# Patient Record
Sex: Female | Born: 1970 | Race: White | Hispanic: No | Marital: Single | Smoking: Never smoker
Health system: Southern US, Community
[De-identification: ages and names within clinical notes are randomized; demographics above are authoritative.]

## PROBLEM LIST (undated history)

## (undated) DIAGNOSIS — G43909 Migraine, unspecified, not intractable, without status migrainosus: Secondary | ICD-10-CM

## (undated) DIAGNOSIS — B360 Pityriasis versicolor: Secondary | ICD-10-CM

## (undated) DIAGNOSIS — K5909 Other constipation: Secondary | ICD-10-CM

## (undated) DIAGNOSIS — S069X9A Unspecified intracranial injury with loss of consciousness of unspecified duration, initial encounter: Secondary | ICD-10-CM

## (undated) DIAGNOSIS — Z9289 Personal history of other medical treatment: Secondary | ICD-10-CM

## (undated) DIAGNOSIS — I7771 Dissection of carotid artery: Secondary | ICD-10-CM

## (undated) DIAGNOSIS — Q330 Congenital cystic lung: Secondary | ICD-10-CM

## (undated) DIAGNOSIS — E559 Vitamin D deficiency, unspecified: Secondary | ICD-10-CM

## (undated) DIAGNOSIS — I10 Essential (primary) hypertension: Secondary | ICD-10-CM

## (undated) DIAGNOSIS — F329 Major depressive disorder, single episode, unspecified: Secondary | ICD-10-CM

## (undated) DIAGNOSIS — D509 Iron deficiency anemia, unspecified: Secondary | ICD-10-CM

## (undated) DIAGNOSIS — F32A Depression, unspecified: Secondary | ICD-10-CM

## (undated) DIAGNOSIS — M1A00X0 Idiopathic chronic gout, unspecified site, without tophus (tophi): Secondary | ICD-10-CM

## (undated) DIAGNOSIS — J189 Pneumonia, unspecified organism: Secondary | ICD-10-CM

## (undated) DIAGNOSIS — Z87898 Personal history of other specified conditions: Secondary | ICD-10-CM

## (undated) DIAGNOSIS — K2270 Barrett's esophagus without dysplasia: Secondary | ICD-10-CM

## (undated) DIAGNOSIS — F419 Anxiety disorder, unspecified: Secondary | ICD-10-CM

## (undated) DIAGNOSIS — R112 Nausea with vomiting, unspecified: Secondary | ICD-10-CM

## (undated) DIAGNOSIS — K449 Diaphragmatic hernia without obstruction or gangrene: Secondary | ICD-10-CM

## (undated) DIAGNOSIS — Z9889 Other specified postprocedural states: Secondary | ICD-10-CM

## (undated) DIAGNOSIS — Z86711 Personal history of pulmonary embolism: Secondary | ICD-10-CM

## (undated) DIAGNOSIS — Z86718 Personal history of other venous thrombosis and embolism: Secondary | ICD-10-CM

## (undated) DIAGNOSIS — E538 Deficiency of other specified B group vitamins: Secondary | ICD-10-CM

## (undated) DIAGNOSIS — K219 Gastro-esophageal reflux disease without esophagitis: Secondary | ICD-10-CM

## (undated) HISTORY — PX: BRONCHOSCOPY: SUR163

## (undated) HISTORY — DX: Personal history of other venous thrombosis and embolism: Z86.718

## (undated) HISTORY — DX: Major depressive disorder, single episode, unspecified: F32.9

## (undated) HISTORY — DX: Depression, unspecified: F32.A

## (undated) HISTORY — PX: OTHER SURGICAL HISTORY: SHX169

## (undated) HISTORY — DX: Personal history of pulmonary embolism: Z86.711

## (undated) HISTORY — DX: Congenital cystic lung: Q33.0

## (undated) HISTORY — PX: EXPLORATORY LAPAROTOMY: SUR591

## (undated) HISTORY — DX: Gastro-esophageal reflux disease without esophagitis: K21.9

## (undated) HISTORY — DX: Anxiety disorder, unspecified: F41.9

## (undated) HISTORY — DX: Pityriasis versicolor: B36.0

## (undated) HISTORY — PX: ANEURYSM COILING: SHX5349

---
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
## 1989-06-20 HISTORY — PX: LUNG LOBECTOMY: SHX167

## 1998-10-27 ENCOUNTER — Emergency Department (HOSPITAL_COMMUNITY): Admission: EM | Admit: 1998-10-27 | Discharge: 1998-10-28 | Payer: Self-pay | Admitting: Internal Medicine

## 1999-03-13 ENCOUNTER — Emergency Department (HOSPITAL_COMMUNITY): Admission: EM | Admit: 1999-03-13 | Discharge: 1999-03-14 | Payer: Self-pay | Admitting: *Deleted

## 1999-04-16 ENCOUNTER — Encounter: Payer: Self-pay | Admitting: Internal Medicine

## 1999-04-16 ENCOUNTER — Ambulatory Visit (HOSPITAL_COMMUNITY): Admission: RE | Admit: 1999-04-16 | Discharge: 1999-04-16 | Payer: Self-pay | Admitting: Internal Medicine

## 1999-05-26 ENCOUNTER — Encounter: Payer: Self-pay | Admitting: Internal Medicine

## 1999-05-26 ENCOUNTER — Encounter: Admission: RE | Admit: 1999-05-26 | Discharge: 1999-05-26 | Payer: Self-pay | Admitting: Internal Medicine

## 1999-08-28 ENCOUNTER — Encounter: Payer: Self-pay | Admitting: Emergency Medicine

## 1999-08-28 ENCOUNTER — Emergency Department (HOSPITAL_COMMUNITY): Admission: EM | Admit: 1999-08-28 | Discharge: 1999-08-28 | Payer: Self-pay | Admitting: Emergency Medicine

## 2000-03-11 ENCOUNTER — Emergency Department (HOSPITAL_COMMUNITY): Admission: EM | Admit: 2000-03-11 | Discharge: 2000-03-11 | Payer: Self-pay | Admitting: Emergency Medicine

## 2000-09-28 ENCOUNTER — Emergency Department (HOSPITAL_COMMUNITY): Admission: EM | Admit: 2000-09-28 | Discharge: 2000-09-29 | Payer: Self-pay | Admitting: Emergency Medicine

## 2000-12-02 ENCOUNTER — Emergency Department (HOSPITAL_COMMUNITY): Admission: EM | Admit: 2000-12-02 | Discharge: 2000-12-02 | Payer: Self-pay | Admitting: Emergency Medicine

## 2002-01-28 ENCOUNTER — Emergency Department (HOSPITAL_COMMUNITY): Admission: EM | Admit: 2002-01-28 | Discharge: 2002-01-28 | Payer: Self-pay | Admitting: Emergency Medicine

## 2002-01-28 ENCOUNTER — Encounter: Payer: Self-pay | Admitting: Emergency Medicine

## 2002-12-01 ENCOUNTER — Emergency Department (HOSPITAL_COMMUNITY): Admission: EM | Admit: 2002-12-01 | Discharge: 2002-12-02 | Payer: Self-pay | Admitting: Emergency Medicine

## 2003-07-13 ENCOUNTER — Emergency Department (HOSPITAL_COMMUNITY): Admission: EM | Admit: 2003-07-13 | Discharge: 2003-07-14 | Payer: Self-pay

## 2003-08-14 ENCOUNTER — Observation Stay (HOSPITAL_COMMUNITY): Admission: EM | Admit: 2003-08-14 | Discharge: 2003-08-15 | Payer: Self-pay | Admitting: Emergency Medicine

## 2003-08-16 ENCOUNTER — Emergency Department (HOSPITAL_COMMUNITY): Admission: EM | Admit: 2003-08-16 | Discharge: 2003-08-16 | Payer: Self-pay | Admitting: Emergency Medicine

## 2003-08-17 ENCOUNTER — Emergency Department (HOSPITAL_COMMUNITY): Admission: EM | Admit: 2003-08-17 | Discharge: 2003-08-17 | Payer: Self-pay | Admitting: Emergency Medicine

## 2003-08-18 ENCOUNTER — Emergency Department: Admission: RE | Admit: 2003-08-18 | Discharge: 2003-08-18 | Payer: Self-pay | Admitting: Internal Medicine

## 2003-09-19 HISTORY — PX: KNEE ARTHROSCOPY W/ ACL RECONSTRUCTION: SHX1858

## 2003-10-14 ENCOUNTER — Observation Stay (HOSPITAL_COMMUNITY): Admission: EM | Admit: 2003-10-14 | Discharge: 2003-10-15 | Payer: Self-pay | Admitting: Orthopedic Surgery

## 2003-12-19 ENCOUNTER — Emergency Department (HOSPITAL_COMMUNITY): Admission: EM | Admit: 2003-12-19 | Discharge: 2003-12-19 | Payer: Self-pay | Admitting: Emergency Medicine

## 2004-01-25 ENCOUNTER — Emergency Department (HOSPITAL_COMMUNITY): Admission: EM | Admit: 2004-01-25 | Discharge: 2004-01-26 | Payer: Self-pay | Admitting: Emergency Medicine

## 2004-04-23 ENCOUNTER — Ambulatory Visit: Payer: Self-pay | Admitting: Hematology & Oncology

## 2004-05-06 ENCOUNTER — Emergency Department (HOSPITAL_COMMUNITY): Admission: EM | Admit: 2004-05-06 | Discharge: 2004-05-06 | Payer: Self-pay | Admitting: Emergency Medicine

## 2004-05-10 ENCOUNTER — Ambulatory Visit: Payer: Self-pay | Admitting: Internal Medicine

## 2004-05-17 ENCOUNTER — Ambulatory Visit: Payer: Self-pay | Admitting: Internal Medicine

## 2004-05-18 ENCOUNTER — Emergency Department (HOSPITAL_COMMUNITY): Admission: EM | Admit: 2004-05-18 | Discharge: 2004-05-18 | Payer: Self-pay | Admitting: Emergency Medicine

## 2004-05-18 ENCOUNTER — Ambulatory Visit: Payer: Self-pay | Admitting: Internal Medicine

## 2004-05-20 ENCOUNTER — Encounter (INDEPENDENT_AMBULATORY_CARE_PROVIDER_SITE_OTHER): Payer: Self-pay | Admitting: Specialist

## 2004-05-20 ENCOUNTER — Ambulatory Visit: Admission: RE | Admit: 2004-05-20 | Discharge: 2004-05-20 | Payer: Self-pay | Admitting: Pulmonary Disease

## 2004-05-20 ENCOUNTER — Ambulatory Visit: Payer: Self-pay | Admitting: Pulmonary Disease

## 2004-05-23 ENCOUNTER — Emergency Department (HOSPITAL_COMMUNITY): Admission: EM | Admit: 2004-05-23 | Discharge: 2004-05-24 | Payer: Self-pay

## 2004-05-27 ENCOUNTER — Ambulatory Visit: Payer: Self-pay | Admitting: Pulmonary Disease

## 2004-06-04 ENCOUNTER — Ambulatory Visit (HOSPITAL_COMMUNITY): Admission: RE | Admit: 2004-06-04 | Discharge: 2004-06-04 | Payer: Self-pay | Admitting: Hematology & Oncology

## 2004-06-19 ENCOUNTER — Emergency Department (HOSPITAL_COMMUNITY): Admission: EM | Admit: 2004-06-19 | Discharge: 2004-06-19 | Payer: Self-pay | Admitting: Emergency Medicine

## 2004-06-22 ENCOUNTER — Encounter: Admission: RE | Admit: 2004-06-22 | Discharge: 2004-06-22 | Payer: Self-pay | Admitting: Thoracic Surgery

## 2004-06-24 ENCOUNTER — Encounter (INDEPENDENT_AMBULATORY_CARE_PROVIDER_SITE_OTHER): Payer: Self-pay | Admitting: *Deleted

## 2004-06-24 ENCOUNTER — Inpatient Hospital Stay (HOSPITAL_COMMUNITY): Admission: RE | Admit: 2004-06-24 | Discharge: 2004-06-29 | Payer: Self-pay | Admitting: Thoracic Surgery

## 2004-07-06 ENCOUNTER — Encounter: Admission: RE | Admit: 2004-07-06 | Discharge: 2004-07-06 | Payer: Self-pay | Admitting: Thoracic Surgery

## 2004-07-26 ENCOUNTER — Ambulatory Visit: Payer: Self-pay | Admitting: Internal Medicine

## 2004-07-26 ENCOUNTER — Ambulatory Visit: Payer: Self-pay | Admitting: Hematology & Oncology

## 2004-07-28 ENCOUNTER — Encounter: Admission: RE | Admit: 2004-07-28 | Discharge: 2004-07-28 | Payer: Self-pay | Admitting: Thoracic Surgery

## 2004-08-04 ENCOUNTER — Ambulatory Visit: Payer: Self-pay | Admitting: Pulmonary Disease

## 2004-08-04 ENCOUNTER — Ambulatory Visit: Payer: Self-pay | Admitting: Internal Medicine

## 2004-08-10 ENCOUNTER — Ambulatory Visit: Payer: Self-pay | Admitting: Internal Medicine

## 2004-08-10 ENCOUNTER — Emergency Department (HOSPITAL_COMMUNITY): Admission: EM | Admit: 2004-08-10 | Discharge: 2004-08-10 | Payer: Self-pay | Admitting: Emergency Medicine

## 2004-08-14 ENCOUNTER — Emergency Department (HOSPITAL_COMMUNITY): Admission: EM | Admit: 2004-08-14 | Discharge: 2004-08-14 | Payer: Self-pay | Admitting: Emergency Medicine

## 2004-08-17 ENCOUNTER — Ambulatory Visit: Payer: Self-pay | Admitting: Internal Medicine

## 2004-08-18 ENCOUNTER — Ambulatory Visit: Payer: Self-pay | Admitting: Internal Medicine

## 2004-09-07 ENCOUNTER — Ambulatory Visit: Payer: Self-pay | Admitting: Internal Medicine

## 2004-09-10 ENCOUNTER — Ambulatory Visit: Payer: Self-pay | Admitting: Hematology & Oncology

## 2004-10-08 ENCOUNTER — Ambulatory Visit (HOSPITAL_COMMUNITY): Admission: RE | Admit: 2004-10-08 | Discharge: 2004-10-08 | Payer: Self-pay | Admitting: Hematology & Oncology

## 2004-11-07 ENCOUNTER — Emergency Department (HOSPITAL_COMMUNITY): Admission: EM | Admit: 2004-11-07 | Discharge: 2004-11-07 | Payer: Self-pay | Admitting: Emergency Medicine

## 2004-11-12 ENCOUNTER — Ambulatory Visit: Payer: Self-pay | Admitting: Internal Medicine

## 2004-12-15 ENCOUNTER — Emergency Department (HOSPITAL_COMMUNITY): Admission: EM | Admit: 2004-12-15 | Discharge: 2004-12-15 | Payer: Self-pay | Admitting: Emergency Medicine

## 2004-12-17 ENCOUNTER — Emergency Department (HOSPITAL_COMMUNITY): Admission: RE | Admit: 2004-12-17 | Discharge: 2004-12-18 | Payer: Self-pay | Admitting: Pulmonary Disease

## 2004-12-17 ENCOUNTER — Ambulatory Visit: Payer: Self-pay | Admitting: Pulmonary Disease

## 2004-12-20 ENCOUNTER — Ambulatory Visit: Payer: Self-pay | Admitting: Internal Medicine

## 2004-12-23 ENCOUNTER — Ambulatory Visit: Payer: Self-pay | Admitting: Pulmonary Disease

## 2005-01-06 ENCOUNTER — Ambulatory Visit: Payer: Self-pay | Admitting: Internal Medicine

## 2005-06-08 ENCOUNTER — Ambulatory Visit: Payer: Self-pay | Admitting: Internal Medicine

## 2005-06-15 ENCOUNTER — Emergency Department (HOSPITAL_COMMUNITY): Admission: EM | Admit: 2005-06-15 | Discharge: 2005-06-15 | Payer: Self-pay | Admitting: Emergency Medicine

## 2005-06-20 DIAGNOSIS — Z9289 Personal history of other medical treatment: Secondary | ICD-10-CM

## 2005-06-20 HISTORY — DX: Personal history of other medical treatment: Z92.89

## 2005-07-08 ENCOUNTER — Ambulatory Visit: Payer: Self-pay | Admitting: Internal Medicine

## 2005-10-14 ENCOUNTER — Ambulatory Visit: Payer: Self-pay | Admitting: Internal Medicine

## 2005-10-16 ENCOUNTER — Emergency Department (HOSPITAL_COMMUNITY): Admission: EM | Admit: 2005-10-16 | Discharge: 2005-10-17 | Payer: Self-pay | Admitting: Emergency Medicine

## 2006-02-01 ENCOUNTER — Ambulatory Visit: Payer: Self-pay | Admitting: Internal Medicine

## 2006-03-23 ENCOUNTER — Ambulatory Visit (HOSPITAL_COMMUNITY): Admission: RE | Admit: 2006-03-23 | Discharge: 2006-03-23 | Payer: Self-pay | Admitting: Gastroenterology

## 2006-04-03 ENCOUNTER — Emergency Department (HOSPITAL_COMMUNITY): Admission: EM | Admit: 2006-04-03 | Discharge: 2006-04-03 | Payer: Self-pay | Admitting: Emergency Medicine

## 2006-04-07 ENCOUNTER — Encounter: Admission: RE | Admit: 2006-04-07 | Discharge: 2006-04-07 | Payer: Self-pay | Admitting: Gastroenterology

## 2006-04-20 ENCOUNTER — Encounter: Admission: RE | Admit: 2006-04-20 | Discharge: 2006-04-20 | Payer: Self-pay | Admitting: Gastroenterology

## 2006-05-31 ENCOUNTER — Inpatient Hospital Stay (HOSPITAL_COMMUNITY): Admission: EM | Admit: 2006-05-31 | Discharge: 2006-06-02 | Payer: Self-pay | Admitting: Emergency Medicine

## 2006-06-01 ENCOUNTER — Ambulatory Visit: Payer: Self-pay | Admitting: Internal Medicine

## 2006-06-05 ENCOUNTER — Ambulatory Visit: Payer: Self-pay | Admitting: Internal Medicine

## 2006-06-07 ENCOUNTER — Emergency Department (HOSPITAL_COMMUNITY): Admission: EM | Admit: 2006-06-07 | Discharge: 2006-06-07 | Payer: Self-pay | Admitting: Emergency Medicine

## 2006-06-14 ENCOUNTER — Ambulatory Visit: Payer: Self-pay | Admitting: Internal Medicine

## 2006-06-26 ENCOUNTER — Ambulatory Visit: Payer: Self-pay | Admitting: Hematology & Oncology

## 2006-06-29 LAB — CBC & DIFF AND RETIC
Basophils Absolute: 0 10*3/uL (ref 0.0–0.1)
Eosinophils Absolute: 0.1 10*3/uL (ref 0.0–0.5)
HCT: 35.6 % (ref 34.8–46.6)
LYMPH%: 15.7 % (ref 14.0–48.0)
MCV: 72.4 fL — ABNORMAL LOW (ref 81.0–101.0)
MONO#: 0.6 10*3/uL (ref 0.1–0.9)
MONO%: 4.6 % (ref 0.0–13.0)
NEUT#: 10.1 10*3/uL — ABNORMAL HIGH (ref 1.5–6.5)
NEUT%: 78.4 % — ABNORMAL HIGH (ref 39.6–76.8)
Platelets: 444 10*3/uL — ABNORMAL HIGH (ref 145–400)
WBC: 12.9 10*3/uL — ABNORMAL HIGH (ref 3.9–10.0)

## 2006-07-03 LAB — FERRITIN: Ferritin: 9 ng/mL — ABNORMAL LOW (ref 10–291)

## 2006-07-03 LAB — TRANSFERRIN RECEPTOR, SOLUABLE: Transferrin Receptor, Soluble: 40.6 nmol/L

## 2006-07-20 LAB — CBC WITH DIFFERENTIAL/PLATELET
Basophils Absolute: 0 10*3/uL (ref 0.0–0.1)
EOS%: 2.1 % (ref 0.0–7.0)
Eosinophils Absolute: 0.1 10*3/uL (ref 0.0–0.5)
HGB: 11.7 g/dL (ref 11.6–15.9)
MCH: 23.7 pg — ABNORMAL LOW (ref 26.0–34.0)
MONO#: 0.6 10*3/uL (ref 0.1–0.9)
NEUT#: 3.7 10*3/uL (ref 1.5–6.5)
RDW: 24.9 % — ABNORMAL HIGH (ref 11.3–14.5)
WBC: 6.4 10*3/uL (ref 3.9–10.0)
lymph#: 2 10*3/uL (ref 0.9–3.3)

## 2006-07-27 LAB — CBC WITH DIFFERENTIAL/PLATELET
Basophils Absolute: 0.1 10*3/uL (ref 0.0–0.1)
Eosinophils Absolute: 0.1 10*3/uL (ref 0.0–0.5)
HCT: 34.5 % — ABNORMAL LOW (ref 34.8–46.6)
HGB: 11.5 g/dL — ABNORMAL LOW (ref 11.6–15.9)
MCV: 72.7 fL — ABNORMAL LOW (ref 81.0–101.0)
MONO%: 7.5 % (ref 0.0–13.0)
NEUT#: 4.3 10*3/uL (ref 1.5–6.5)
RDW: 23.8 % — ABNORMAL HIGH (ref 11.3–14.5)

## 2006-08-03 ENCOUNTER — Ambulatory Visit: Payer: Self-pay | Admitting: Internal Medicine

## 2006-08-03 ENCOUNTER — Observation Stay (HOSPITAL_COMMUNITY): Admission: EM | Admit: 2006-08-03 | Discharge: 2006-08-04 | Payer: Self-pay | Admitting: Emergency Medicine

## 2006-08-08 ENCOUNTER — Ambulatory Visit: Payer: Self-pay | Admitting: Hematology & Oncology

## 2006-10-28 ENCOUNTER — Emergency Department (HOSPITAL_COMMUNITY): Admission: EM | Admit: 2006-10-28 | Discharge: 2006-10-28 | Payer: Self-pay | Admitting: Emergency Medicine

## 2006-10-30 ENCOUNTER — Ambulatory Visit: Payer: Self-pay | Admitting: Internal Medicine

## 2006-12-17 ENCOUNTER — Emergency Department (HOSPITAL_COMMUNITY): Admission: EM | Admit: 2006-12-17 | Discharge: 2006-12-17 | Payer: Self-pay | Admitting: Emergency Medicine

## 2006-12-20 ENCOUNTER — Ambulatory Visit: Payer: Self-pay | Admitting: Internal Medicine

## 2007-01-13 ENCOUNTER — Encounter: Payer: Self-pay | Admitting: Internal Medicine

## 2007-01-13 DIAGNOSIS — Z86718 Personal history of other venous thrombosis and embolism: Secondary | ICD-10-CM | POA: Insufficient documentation

## 2007-01-13 DIAGNOSIS — R042 Hemoptysis: Secondary | ICD-10-CM | POA: Insufficient documentation

## 2007-02-01 ENCOUNTER — Emergency Department (HOSPITAL_COMMUNITY): Admission: EM | Admit: 2007-02-01 | Discharge: 2007-02-02 | Payer: Self-pay | Admitting: Emergency Medicine

## 2007-09-07 ENCOUNTER — Telehealth: Payer: Self-pay | Admitting: Internal Medicine

## 2008-05-09 ENCOUNTER — Telehealth: Payer: Self-pay | Admitting: Internal Medicine

## 2008-05-09 ENCOUNTER — Encounter: Payer: Self-pay | Admitting: Internal Medicine

## 2008-05-26 ENCOUNTER — Ambulatory Visit: Payer: Self-pay | Admitting: Internal Medicine

## 2008-05-26 DIAGNOSIS — J309 Allergic rhinitis, unspecified: Secondary | ICD-10-CM | POA: Insufficient documentation

## 2008-05-26 DIAGNOSIS — B351 Tinea unguium: Secondary | ICD-10-CM | POA: Insufficient documentation

## 2008-05-26 DIAGNOSIS — K219 Gastro-esophageal reflux disease without esophagitis: Secondary | ICD-10-CM | POA: Insufficient documentation

## 2008-05-26 DIAGNOSIS — G43909 Migraine, unspecified, not intractable, without status migrainosus: Secondary | ICD-10-CM | POA: Insufficient documentation

## 2008-05-26 DIAGNOSIS — J45909 Unspecified asthma, uncomplicated: Secondary | ICD-10-CM | POA: Insufficient documentation

## 2008-05-28 ENCOUNTER — Encounter: Payer: Self-pay | Admitting: Internal Medicine

## 2008-09-09 ENCOUNTER — Ambulatory Visit: Payer: Self-pay | Admitting: Internal Medicine

## 2008-09-09 DIAGNOSIS — F329 Major depressive disorder, single episode, unspecified: Secondary | ICD-10-CM | POA: Insufficient documentation

## 2008-09-09 DIAGNOSIS — F32A Depression, unspecified: Secondary | ICD-10-CM | POA: Insufficient documentation

## 2008-09-16 ENCOUNTER — Telehealth: Payer: Self-pay | Admitting: Internal Medicine

## 2008-09-26 ENCOUNTER — Ambulatory Visit: Payer: Self-pay | Admitting: Internal Medicine

## 2008-09-26 DIAGNOSIS — J9801 Acute bronchospasm: Secondary | ICD-10-CM | POA: Insufficient documentation

## 2008-09-26 DIAGNOSIS — J019 Acute sinusitis, unspecified: Secondary | ICD-10-CM | POA: Insufficient documentation

## 2008-11-18 ENCOUNTER — Emergency Department (HOSPITAL_COMMUNITY): Admission: EM | Admit: 2008-11-18 | Discharge: 2008-11-18 | Payer: Self-pay | Admitting: Emergency Medicine

## 2008-11-18 ENCOUNTER — Ambulatory Visit: Payer: Self-pay | Admitting: Internal Medicine

## 2008-11-18 DIAGNOSIS — J069 Acute upper respiratory infection, unspecified: Secondary | ICD-10-CM | POA: Insufficient documentation

## 2008-12-11 ENCOUNTER — Emergency Department (HOSPITAL_COMMUNITY): Admission: EM | Admit: 2008-12-11 | Discharge: 2008-12-11 | Payer: Self-pay | Admitting: Emergency Medicine

## 2008-12-11 ENCOUNTER — Telehealth: Payer: Self-pay | Admitting: Internal Medicine

## 2008-12-12 ENCOUNTER — Ambulatory Visit: Payer: Self-pay | Admitting: Endocrinology

## 2008-12-12 DIAGNOSIS — D649 Anemia, unspecified: Secondary | ICD-10-CM | POA: Insufficient documentation

## 2008-12-12 DIAGNOSIS — R002 Palpitations: Secondary | ICD-10-CM | POA: Insufficient documentation

## 2008-12-23 ENCOUNTER — Telehealth: Payer: Self-pay | Admitting: Internal Medicine

## 2008-12-24 ENCOUNTER — Ambulatory Visit: Payer: Self-pay | Admitting: Internal Medicine

## 2008-12-24 DIAGNOSIS — R609 Edema, unspecified: Secondary | ICD-10-CM | POA: Insufficient documentation

## 2009-01-06 ENCOUNTER — Emergency Department (HOSPITAL_BASED_OUTPATIENT_CLINIC_OR_DEPARTMENT_OTHER): Admission: EM | Admit: 2009-01-06 | Discharge: 2009-01-06 | Payer: Self-pay | Admitting: Emergency Medicine

## 2009-01-06 ENCOUNTER — Ambulatory Visit: Payer: Self-pay | Admitting: Diagnostic Radiology

## 2009-02-06 ENCOUNTER — Ambulatory Visit: Payer: Self-pay | Admitting: Internal Medicine

## 2009-02-06 DIAGNOSIS — R1110 Vomiting, unspecified: Secondary | ICD-10-CM | POA: Insufficient documentation

## 2009-02-09 ENCOUNTER — Ambulatory Visit: Payer: Self-pay | Admitting: Internal Medicine

## 2009-02-09 DIAGNOSIS — R42 Dizziness and giddiness: Secondary | ICD-10-CM | POA: Insufficient documentation

## 2009-02-09 DIAGNOSIS — E559 Vitamin D deficiency, unspecified: Secondary | ICD-10-CM | POA: Insufficient documentation

## 2009-02-09 DIAGNOSIS — R5381 Other malaise: Secondary | ICD-10-CM

## 2009-02-09 DIAGNOSIS — E876 Hypokalemia: Secondary | ICD-10-CM | POA: Insufficient documentation

## 2009-02-09 DIAGNOSIS — R5383 Other fatigue: Secondary | ICD-10-CM | POA: Insufficient documentation

## 2009-02-09 LAB — CONVERTED CEMR LAB: Lipase: 12 units/L (ref 0–75)

## 2009-02-10 LAB — CONVERTED CEMR LAB
AST: 27 units/L (ref 0–37)
BUN: 12 mg/dL (ref 6–23)
Basophils Absolute: 0 10*3/uL (ref 0.0–0.1)
Eosinophils Absolute: 0.1 10*3/uL (ref 0.0–0.7)
GFR calc non Af Amer: 65.71 mL/min (ref >60)
Glucose, Bld: 65 mg/dL — ABNORMAL LOW (ref 70–99)
HCT: 32.3 % — ABNORMAL LOW (ref 36.0–46.0)
Hemoglobin, Urine: NEGATIVE
Leukocytes, UA: NEGATIVE
Lymphs Abs: 2.1 10*3/uL (ref 0.7–4.0)
MCHC: 31.3 g/dL (ref 30.0–36.0)
Monocytes Absolute: 0.9 10*3/uL (ref 0.1–1.0)
Monocytes Relative: 6.9 % (ref 3.0–12.0)
Nitrite: NEGATIVE
Platelets: 447 10*3/uL — ABNORMAL HIGH (ref 150.0–400.0)
Potassium: 2.9 meq/L — ABNORMAL LOW (ref 3.5–5.1)
RDW: 15.8 % — ABNORMAL HIGH (ref 11.5–14.6)
Sed Rate: 47 mm/hr — ABNORMAL HIGH (ref 0–22)
Specific Gravity, Urine: <=1.005 (ref 1.000–1.030)
TSH: 2.08 microintl units/mL (ref 0.35–5.50)
Total Bilirubin: 0.9 mg/dL (ref 0.3–1.2)
Urobilinogen, UA: 0.2 (ref 0.0–1.0)

## 2009-02-12 ENCOUNTER — Telehealth: Payer: Self-pay | Admitting: Internal Medicine

## 2009-02-12 LAB — CONVERTED CEMR LAB: Vit D, 25-Hydroxy: 17 ng/mL — ABNORMAL LOW (ref 30–89)

## 2009-03-12 ENCOUNTER — Telehealth: Payer: Self-pay | Admitting: Internal Medicine

## 2009-04-06 ENCOUNTER — Telehealth: Payer: Self-pay | Admitting: Internal Medicine

## 2009-04-09 ENCOUNTER — Ambulatory Visit: Payer: Self-pay | Admitting: Internal Medicine

## 2009-04-09 LAB — CONVERTED CEMR LAB
ALT: 14 units/L (ref 0–35)
BUN: 7 mg/dL (ref 6–23)
Basophils Absolute: 0 10*3/uL (ref 0.0–0.1)
Bilirubin Urine: NEGATIVE
Bilirubin, Direct: 0.1 mg/dL (ref 0.0–0.3)
Chloride: 101 meq/L (ref 96–112)
Cholesterol: 223 mg/dL — ABNORMAL HIGH (ref 0–200)
Creatinine, Ser: 0.8 mg/dL (ref 0.4–1.2)
Eosinophils Absolute: 0.1 10*3/uL (ref 0.0–0.7)
GFR calc non Af Amer: 84.93 mL/min (ref >60)
Hemoglobin, Urine: NEGATIVE
Ketones, ur: NEGATIVE mg/dL
Leukocytes, UA: NEGATIVE
Lymphocytes Relative: 24.2 % (ref 12.0–46.0)
MCHC: 32.5 g/dL (ref 30.0–36.0)
Monocytes Relative: 5 % (ref 3.0–12.0)
Nitrite: NEGATIVE
Platelets: 350 10*3/uL (ref 150.0–400.0)
Potassium: 3.6 meq/L (ref 3.5–5.1)
RDW: 24.6 % — ABNORMAL HIGH (ref 11.5–14.6)
TSH: 1.05 microintl units/mL (ref 0.35–5.50)
Total Bilirubin: 0.7 mg/dL (ref 0.3–1.2)
Total CHOL/HDL Ratio: 6
Triglycerides: 94 mg/dL (ref 0.0–149.0)
Urobilinogen, UA: 0.2 (ref 0.0–1.0)
VLDL: 18.8 mg/dL (ref 0.0–40.0)
pH: 5.5 (ref 5.0–8.0)

## 2009-04-14 ENCOUNTER — Telehealth: Payer: Self-pay | Admitting: Internal Medicine

## 2009-04-20 ENCOUNTER — Telehealth: Payer: Self-pay | Admitting: Internal Medicine

## 2009-05-27 ENCOUNTER — Telehealth: Payer: Self-pay | Admitting: Internal Medicine

## 2009-06-16 ENCOUNTER — Encounter: Payer: Self-pay | Admitting: Internal Medicine

## 2009-07-17 ENCOUNTER — Ambulatory Visit (HOSPITAL_COMMUNITY): Admission: RE | Admit: 2009-07-17 | Discharge: 2009-07-17 | Payer: Self-pay | Admitting: Obstetrics and Gynecology

## 2009-08-21 ENCOUNTER — Ambulatory Visit: Payer: Self-pay | Admitting: Internal Medicine

## 2009-08-21 DIAGNOSIS — F411 Generalized anxiety disorder: Secondary | ICD-10-CM | POA: Insufficient documentation

## 2009-08-21 DIAGNOSIS — F419 Anxiety disorder, unspecified: Secondary | ICD-10-CM | POA: Insufficient documentation

## 2009-08-24 ENCOUNTER — Emergency Department (HOSPITAL_COMMUNITY): Admission: EM | Admit: 2009-08-24 | Discharge: 2009-08-24 | Payer: Self-pay | Admitting: Emergency Medicine

## 2009-08-24 LAB — CONVERTED CEMR LAB
ALT: 15 units/L (ref 0–35)
BUN: 12 mg/dL (ref 6–23)
Bilirubin Urine: NEGATIVE
Bilirubin, Direct: 0.1 mg/dL (ref 0.0–0.3)
CO2: 32 meq/L (ref 19–32)
Chloride: 103 meq/L (ref 96–112)
Creatinine, Ser: 0.9 mg/dL (ref 0.4–1.2)
Eosinophils Absolute: 0.1 10*3/uL (ref 0.0–0.7)
Eosinophils Relative: 1.1 % (ref 0.0–5.0)
Glucose, Bld: 94 mg/dL (ref 70–99)
HCT: 41.4 % (ref 36.0–46.0)
Ketones, ur: NEGATIVE mg/dL
Leukocytes, UA: NEGATIVE
Lymphs Abs: 2.2 10*3/uL (ref 0.7–4.0)
MCHC: 33 g/dL (ref 30.0–36.0)
MCV: 89.9 fL (ref 78.0–100.0)
Monocytes Absolute: 0.7 10*3/uL (ref 0.1–1.0)
Neutrophils Relative %: 66.6 % (ref 43.0–77.0)
Nitrite: NEGATIVE
Platelets: 325 10*3/uL (ref 150.0–400.0)
Potassium: 3.8 meq/L (ref 3.5–5.1)
RDW: 13.9 % (ref 11.5–14.6)
Sed Rate: 24 mm/hr — ABNORMAL HIGH (ref 0–22)
Specific Gravity, Urine: 1.02 (ref 1.000–1.030)
TSH: 1.53 microintl units/mL (ref 0.35–5.50)
Total Bilirubin: 0.6 mg/dL (ref 0.3–1.2)
Urobilinogen, UA: 0.2 (ref 0.0–1.0)
WBC: 8.9 10*3/uL (ref 4.5–10.5)

## 2009-09-14 ENCOUNTER — Telehealth: Payer: Self-pay | Admitting: Internal Medicine

## 2009-10-01 ENCOUNTER — Ambulatory Visit: Payer: Self-pay | Admitting: Internal Medicine

## 2009-10-01 DIAGNOSIS — H60399 Other infective otitis externa, unspecified ear: Secondary | ICD-10-CM | POA: Insufficient documentation

## 2009-10-01 DIAGNOSIS — E538 Deficiency of other specified B group vitamins: Secondary | ICD-10-CM | POA: Insufficient documentation

## 2009-10-01 DIAGNOSIS — H6120 Impacted cerumen, unspecified ear: Secondary | ICD-10-CM | POA: Insufficient documentation

## 2009-10-02 ENCOUNTER — Telehealth: Payer: Self-pay | Admitting: Internal Medicine

## 2009-11-08 ENCOUNTER — Emergency Department (HOSPITAL_COMMUNITY): Admission: EM | Admit: 2009-11-08 | Discharge: 2009-11-08 | Payer: Self-pay | Admitting: Emergency Medicine

## 2009-11-10 ENCOUNTER — Ambulatory Visit: Payer: Self-pay | Admitting: Internal Medicine

## 2009-11-10 DIAGNOSIS — H6690 Otitis media, unspecified, unspecified ear: Secondary | ICD-10-CM | POA: Insufficient documentation

## 2009-12-02 ENCOUNTER — Ambulatory Visit: Payer: Self-pay | Admitting: Internal Medicine

## 2009-12-02 DIAGNOSIS — B354 Tinea corporis: Secondary | ICD-10-CM | POA: Insufficient documentation

## 2009-12-02 DIAGNOSIS — L608 Other nail disorders: Secondary | ICD-10-CM | POA: Insufficient documentation

## 2009-12-04 ENCOUNTER — Telehealth: Payer: Self-pay | Admitting: Internal Medicine

## 2009-12-04 DIAGNOSIS — I872 Venous insufficiency (chronic) (peripheral): Secondary | ICD-10-CM | POA: Insufficient documentation

## 2010-01-13 ENCOUNTER — Emergency Department (HOSPITAL_COMMUNITY): Admission: EM | Admit: 2010-01-13 | Discharge: 2010-01-13 | Payer: Self-pay | Admitting: Emergency Medicine

## 2010-01-19 ENCOUNTER — Telehealth: Payer: Self-pay | Admitting: Internal Medicine

## 2010-02-09 ENCOUNTER — Emergency Department (HOSPITAL_COMMUNITY): Admission: EM | Admit: 2010-02-09 | Discharge: 2010-02-09 | Payer: Self-pay | Admitting: Emergency Medicine

## 2010-02-25 ENCOUNTER — Telehealth: Payer: Self-pay | Admitting: Internal Medicine

## 2010-03-25 ENCOUNTER — Encounter (INDEPENDENT_AMBULATORY_CARE_PROVIDER_SITE_OTHER): Payer: Self-pay | Admitting: Emergency Medicine

## 2010-03-25 ENCOUNTER — Ambulatory Visit: Payer: Self-pay | Admitting: Vascular Surgery

## 2010-03-25 ENCOUNTER — Emergency Department (HOSPITAL_COMMUNITY): Admission: EM | Admit: 2010-03-25 | Discharge: 2010-03-25 | Payer: Self-pay | Admitting: Emergency Medicine

## 2010-06-08 ENCOUNTER — Emergency Department (HOSPITAL_COMMUNITY)
Admission: EM | Admit: 2010-06-08 | Discharge: 2010-06-08 | Payer: Self-pay | Source: Home / Self Care | Admitting: Emergency Medicine

## 2010-06-15 ENCOUNTER — Telehealth: Payer: Self-pay | Admitting: Internal Medicine

## 2010-06-25 ENCOUNTER — Telehealth: Payer: Self-pay | Admitting: Internal Medicine

## 2010-07-08 ENCOUNTER — Telehealth: Payer: Self-pay | Admitting: Internal Medicine

## 2010-07-09 ENCOUNTER — Ambulatory Visit
Admission: RE | Admit: 2010-07-09 | Discharge: 2010-07-09 | Payer: Self-pay | Source: Home / Self Care | Attending: Internal Medicine | Admitting: Internal Medicine

## 2010-07-09 DIAGNOSIS — J209 Acute bronchitis, unspecified: Secondary | ICD-10-CM | POA: Insufficient documentation

## 2010-07-09 DIAGNOSIS — J40 Bronchitis, not specified as acute or chronic: Secondary | ICD-10-CM | POA: Insufficient documentation

## 2010-07-11 ENCOUNTER — Encounter: Payer: Self-pay | Admitting: Pulmonary Disease

## 2010-07-11 ENCOUNTER — Encounter: Payer: Self-pay | Admitting: Thoracic Surgery

## 2010-07-11 ENCOUNTER — Inpatient Hospital Stay (HOSPITAL_COMMUNITY)
Admission: EM | Admit: 2010-07-11 | Discharge: 2010-07-14 | Payer: Self-pay | Source: Home / Self Care | Attending: Internal Medicine | Admitting: Internal Medicine

## 2010-07-12 ENCOUNTER — Telehealth: Payer: Self-pay | Admitting: Internal Medicine

## 2010-07-12 DIAGNOSIS — F331 Major depressive disorder, recurrent, moderate: Secondary | ICD-10-CM

## 2010-07-13 LAB — COMPREHENSIVE METABOLIC PANEL
BUN: 12 mg/dL (ref 6–23)
CO2: 26 mEq/L (ref 19–32)
Creatinine, Ser: 1.09 mg/dL (ref 0.4–1.2)
GFR calc non Af Amer: 56 mL/min — ABNORMAL LOW (ref >60)
Sodium: 139 mEq/L (ref 135–145)
Total Bilirubin: 0.8 mg/dL (ref 0.3–1.2)

## 2010-07-13 LAB — CBC
HCT: 37.4 % (ref 36.0–46.0)
Hemoglobin: 15 g/dL (ref 12.0–15.0)
Hemoglobin: 12.6 g/dL (ref 12.0–15.0)
MCHC: 35.1 g/dL (ref 30.0–36.0)
MCV: 84.4 fL (ref 78.0–100.0)
MCV: 85.8 fL (ref 78.0–100.0)
Platelets: 340 10*3/uL (ref 150–400)
Platelets: 294 10*3/uL (ref 150–400)
RBC: 4.97 MIL/uL (ref 3.87–5.11)
RBC: 4.36 MIL/uL (ref 3.87–5.11)
RDW: 13 % (ref 11.5–15.5)
WBC: 21.1 10*3/uL — ABNORMAL HIGH (ref 4.0–10.5)
WBC: 24.8 10*3/uL — ABNORMAL HIGH (ref 4.0–10.5)
WBC: 17 10*3/uL — ABNORMAL HIGH (ref 4.0–10.5)

## 2010-07-13 LAB — URINALYSIS, ROUTINE W REFLEX MICROSCOPIC
Hgb urine dipstick: NEGATIVE
Ketones, ur: NEGATIVE mg/dL
Specific Gravity, Urine: 1.005 (ref 1.005–1.030)
Urobilinogen, UA: 0.2 mg/dL (ref 0.0–1.0)

## 2010-07-13 LAB — DIFFERENTIAL
Eosinophils Absolute: 0 10*3/uL (ref 0.0–0.7)
Eosinophils Absolute: 0.1 10*3/uL (ref 0.0–0.7)
Eosinophils Relative: 0 % (ref 0–5)
Lymphocytes Relative: 14 % (ref 12–46)
Lymphocytes Relative: 23 % (ref 12–46)
Lymphs Abs: 3.4 10*3/uL (ref 0.7–4.0)
Lymphs Abs: 4 10*3/uL (ref 0.7–4.0)
Neutro Abs: 12 10*3/uL — ABNORMAL HIGH (ref 1.7–7.7)
Neutrophils Relative %: 80 % — ABNORMAL HIGH (ref 43–77)
Neutrophils Relative %: 70 % (ref 43–77)

## 2010-07-13 LAB — BASIC METABOLIC PANEL
BUN: 12 mg/dL (ref 6–23)
CO2: 24 mEq/L (ref 19–32)
Chloride: 108 mEq/L (ref 96–112)
Potassium: 2.8 mEq/L — ABNORMAL LOW (ref 3.5–5.1)

## 2010-07-13 LAB — GLUCOSE, CAPILLARY: Glucose-Capillary: 121 mg/dL — ABNORMAL HIGH (ref 70–99)

## 2010-07-14 DIAGNOSIS — F331 Major depressive disorder, recurrent, moderate: Secondary | ICD-10-CM

## 2010-07-14 LAB — CBC
HCT: 36.1 % (ref 36.0–46.0)
Hemoglobin: 12.2 g/dL (ref 12.0–15.0)
MCH: 29 pg (ref 26.0–34.0)
MCHC: 33.8 g/dL (ref 30.0–36.0)
MCHC: 33.8 g/dL (ref 30.0–36.0)
MCV: 85.5 fL (ref 78.0–100.0)
Platelets: 296 10*3/uL (ref 150–400)

## 2010-07-14 LAB — BASIC METABOLIC PANEL
CO2: 26 mEq/L (ref 19–32)
Calcium: 9 mg/dL (ref 8.4–10.5)
Calcium: 9.1 mg/dL (ref 8.4–10.5)
Creatinine, Ser: 0.79 mg/dL (ref 0.4–1.2)
GFR calc Af Amer: >60 mL/min (ref >60)
Glucose, Bld: 91 mg/dL (ref 70–99)
Potassium: 3.3 mEq/L — ABNORMAL LOW (ref 3.5–5.1)
Sodium: 142 mEq/L (ref 135–145)

## 2010-07-14 LAB — TSH: TSH: 1.374 u[IU]/mL (ref 0.350–4.500)

## 2010-07-14 LAB — GLUCOSE, CAPILLARY: Glucose-Capillary: 120 mg/dL — ABNORMAL HIGH (ref 70–99)

## 2010-07-14 LAB — URINE CULTURE
Colony Count: 35000
Culture  Setup Time: 201201231229

## 2010-07-14 LAB — MAGNESIUM: Magnesium: 2 mg/dL (ref 1.5–2.5)

## 2010-07-15 ENCOUNTER — Telehealth: Payer: Self-pay | Admitting: Internal Medicine

## 2010-07-15 NOTE — Consult Note (Addendum)
[name], [name]               ACCOUNT NO.:  [id number]  MEDICAL RECORD NO.:  [id number]          PATIENT TYPE:  INP  LOCATION:  1521                         FACILITY:  [facility]  PHYSICIAN:  Eulogio Ditch, MD DATE OF BIRTH:  10/11/1970  DATE OF CONSULTATION:  07/13/2010 DATE OF DISCHARGE:                                CONSULTATION   REASON FOR CONSULTATION:  Depressed mood.  HISTORY OF PRESENT ILLNESS:  A 40 year old white female who was admitted for shortness of breath.  The patient told me that she has a long history of depression and she has taken many medications for depression. 1. She was on Prozac in the past which caused her headache. 2. She was on Effexor which caused weight gain almost 40 pounds. 3. Wellbutrin made her dizzy. 4. BuSpar caused tachycardia. 5. She was on Celexa, which made her sleepy, but on Celexa, her     depressed mood improved a lot.  The patient is pleasant on approach, very calm, cooperative.  Denies any suicidal or homicidal ideations.  Reported depressed mood because of a lot of stress.  She is not having a job.  She is having financial stress.  She has medical issues going on.  She also reported having panic attacks sometimes.  PAST PSYCHIATRIC HISTORY:  The patient has been admitted to [facility] in the past and being on number of antidepressant in the past. No history of suicide attempt in the past.  SUBSTANCE ABUSE HISTORY:  None.  ALLERGIES:  She is allergic to: 1. IRON. 2. NYQUIL. 3. PENICILLIN. 4. TETRACYCLINE. 5. VICODIN. 6. CODEINE. 7. SULFA. 8. GUAIFENESIN. 9. MORPHINE. 10.DILAUDID. 11.SUDAFED. 12.DOXEPIN. 13.PROTONIX.  PAST MEDICAL HISTORY: 1. Pulmonary embolism. 2. DVTs. 3. Multiple pneumonias. 4. Chronic bronchitis. 5. Migraines. 6. GERD. 7. Barrett esophagus. 8. 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  SOCIAL HISTORY:  The patient currently living with sister.  Never been married.  No kids.   Not in relationship since 2009.  Does not have any job.  MENTAL STATUS EXAMINATION:  Calm, cooperative during the interview. Fair eye contact, pleasant on approach.  Mood euthymic affect, mood depressed affect, mood congruent.  Speech normal.  Hygiene and grooming normal.  Thought process logical and goal directed.  Thought content not suicidal or homicidal, not delusional.  Thought perception, no audiovisual hallucination reported.  Not internally preoccupied. Cognition alert, awake, oriented x3.  Memory immediate, recent remote intact.  Attention and concentration fair.  Abstraction ability good. Fund of knowledge fair.  Insight and judgment intact.  LABORATORY DATA:  Basic metabolic panel within normal limits.  CBC within normal limits.  Urinalysis within normal limits.  Urine drug screen positive for benzodiazepines, alcohol level less than 5. Acetaminophen level less than 10.  Pregnancy test negative.  RADIOLOGY:  CT angiography chest, no pulmonary emboli present.  There is a pulmonary scarring and emphysematous change.  No active pulmonary disease.  DIAGNOSES:  AXIS I:  Major depressive disorder, recurrent type; anxiety disorder, not otherwise specified; rule out generalized anxiety disorder, panic disorder without agoraphobia. AXIS II:  Deferred. AXIS III:  See medical notes. AXIS IV:  Psychosocial stressors. AXIS V:  50.  RECOMMENDATIONS: 1. I discussed different options with the patient.  Zoloft will be a     good medication for this patient as it will help for decrease in     shortness of breath, but the patient do not have insurance, so she     cannot afford Zoloft at this time. 2. I started the patient on Celexa.  She had improvement in the past     on Celexa.  I told her to take only at bedtime, so that she will     not feel sleepy during the daytime. 3. Celexa 20 mg p.o. at bedtime started. 4. The patient can be followed in the outpatient setting.  I      recommended counseling and remaining compliant on the medication. 5. Supportive psychotherapy given to the patient.     Eulogio Ditch, MD     SA/MEDQ  D:  07/13/2010  T:  07/13/2010  Job:  [id number]  Electronically Signed by Eulogio Ditch  on 07/15/2010 05:33:17 AM

## 2010-07-16 ENCOUNTER — Telehealth: Payer: Self-pay | Admitting: Internal Medicine

## 2010-07-16 NOTE — Discharge Summary (Signed)
[name], [name]               ACCOUNT NO.:  [id number]  MEDICAL RECORD NO.:  [id number]          PATIENT TYPE:  INP  LOCATION:  1521                         FACILITY:  [facility]  PHYSICIAN:  Clydia Llano, MD       DATE OF BIRTH:  06-03-1971  DATE OF ADMISSION:  07/11/2010 DATE OF DISCHARGE:                              DISCHARGE SUMMARY   PRIMARY CARE PHYSICIAN:  Georgina Quint. Plotnikov, M.D.  REASON FOR ADMISSION:  Cough and shortness of breath.  DISCHARGE DIAGNOSES: 1. Acute bronchitis. 2. History of pulmonary embolism. 3. History of deep venous thromboses. 4. Multiple pneumonias. 5. Chronic bronchitis. 6. Migraine. 7. Gastroesophageal reflux disease. 8. Barrett's esophagus. 9. 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 10.Questionable achalasia.  DISCHARGE MEDICATIONS: 1. Celexa 200 mg p.o. q.h.s. 2. Moxifloxacin 400 mg for 3 more days. 3. Diazepam 2 mg three times daily as needed for anxiety. 4. Hydrochlorothiazide 25 mg p.o. daily. 5. Iron 325 mg p.o. daily. 6. Klor-Con 20 mEq p.o. daily. 7. Multivitamin 1 tablet p.o. daily. 8. Prednisone, take 30 mg for 3 days, take 20 mg for 3 days, and take     10 mg for 3 days and then stop. 9. Sumatriptan 100 mg daily as needed for migraine. 10.Symbicort 160/4.5 mcg 2 puffs b.i.d. 11.Vitamin B12 500 mcg 1 tablet p.o. daily. 12.Vitamin D3 1000 units OTC p.o. daily. 13.Zegerid 40/1100 mg, 1 capsule p.o. b.i.d.  RADIOLOGY:  CT angiogram January 23 showed no PE, previous right lower lobectomy.  There is pulmonary scarring and emphysematous changes, no active pulmonary disease.  Dilated fluid-filled esophagus with possibility of achalasia.  BRIEF HISTORY EXAMINATION:  Mrs. [name] is a 40 year old female with a significant pulmonary history from pulmonary blebs due to being  born premature.  The patient has bilateral lower lobectomy.  Has a history of recurrent DVTs and PEs in which  she completed a course of anticoagulation, and a history of multiple pneumonias as well.  The patient was having upper respiratory tract symptoms with cough and shortness of breath for the past few days and went to her primary care physician on Sunday.  The patient was diagnosed with bronchitis.  She was about to be put on Avelox and ciprofloxacin, but the patient said that she could not afford the Avelox and was put on Cipro.  The patient was getting worse, and her symptoms were not resolving and so that is why she came back to the hospital.  BRIEF HOSPITAL COURSE: 1. Acute bronchitis.  As mentioned above, the patient had difficulty     getting the Avelox, so that was replaced by Cipro.  The patient was     not getting better.  She came into the hospital.  Upon evaluation     in the ED, D-dimers were elevated a little bit upon evaluation in     the emergency department.  The patient had a CT scan.  The CT scan     showed no PE and no evidence of significant infiltrates.  She was     treated empirically  with Avelox and prednisone and to complete 5     days of antibiotic and taper of prednisone. 2. Questionable achalasia.  The patient has gastroesophageal reflux     disease with Barrett's esophagus.  CT scan showed some fluids and     in the esophagus, which is probably consistent with the Barrett's     esophagus rather than the achalasia.  We continued the PPI while     she was in the hospital here, and there were no changes and she was     not complaining about that time. 3. Hypertension.  The patient is on hydrochlorothiazide.  Blood     pressure was on the high side in the hospital, probably secondary     to steroids and water and salt retention. It was about in the 140s     over 90s during the hospital stay.  That was discussed with the     patient and told that after weaned off steroids, she has to check     with her primary care physician. 4. Hyperglycemia during the hospital  stay, probably secondary to the     steroid-induced hyperglycemia.  DISCHARGE INSTRUCTIONS: 1. Diet regular. 2. Activity as tolerated.  DISPOSITION:  Home.     Clydia Llano, MD     ME/MEDQ  D:  07/14/2010  T:  07/14/2010  Job:  [id number]  cc:   Georgina Quint. Plotnikov, MD [address] [place] [address]  Electronically Signed by Clydia Llano  on 07/16/2010 08:22:03 PM

## 2010-07-18 LAB — CULTURE, BLOOD (ROUTINE X 2)
Culture  Setup Time: 201201231106
Culture: NO GROWTH
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
## 2010-07-20 NOTE — Assessment & Plan Note (Signed)
Summary: COUGH -EAR ACHE--NOSE BLOWING--ER FU FROM: 11/08/09---STC   Vital Signs:  Patient profile:   40 year old female Height:      68 inches Weight:      132.25 pounds BMI:     20.18 O2 Sat:      98 % on Room air Temp:     97.5 degrees F oral Pulse rate:   85 / minute BP sitting:   112 / 82  (left arm) Cuff size:   regular  Vitals Entered ByZella Ball Ewing (Nov 10, 2009 4:10 PM)  O2 Flow:  Room air CC: Cough, ear pain, congestion/RE   CC:  Cough, ear pain, and congestion/RE.  History of Present Illness: The patient presents with complaints of sore throat, fever, cough, sinus congestion and drainge of several days duration. Not better with OTC meds. Chest hurts with coughing. Can't sleep due to cough. Muscle aches are present.  The mucus is colored. Took a Zpac. Not better. She went to ER on Sun, had a CXR. C/o weakness. Nasal d/c is yellow.  Current Medications (verified): 1)  Zegerid 40-1100 Mg  Caps (Omeprazole-Sodium Bicarbonate) .... Take 1 By Mouth Qd 2)  Penlac 8 % Soln (Ciclopirox) .... Use Once Daily X 12 Mo 3)  Sumatriptan Succinate 100 Mg Tabs (Sumatriptan Succinate) .Marland Kitchen.. 1 By Mouth Once Daily 4)  Lexapro 10 Mg Tabs (Escitalopram Oxalate) .Marland Kitchen.. 1 By Mouth Once Daily 5)  Hydrochlorothiazide 25 Mg  Tabs (Hydrochlorothiazide) .... Take 1 Tab By Mouth Every Morning 6)  Klor-Con M20 20 Meq Cr-Tabs (Potassium Chloride Crys Cr) .Marland Kitchen.. 1 Once Daily 7)  Sumavel Dosepro 6 Mg/0.53ml Devi (Sumatriptan Succinate) .... Use Once Daily Prn 8)  Vitamin D3 1000 Unit  Tabs (Cholecalciferol) .Marland Kitchen.. 1 By Mouth Daily 9)  Diazepam 2 Mg Tabs (Diazepam) .Marland Kitchen.. 1 By Mouth Three Times A Day As Needed Anxiety 10)  Vitamin B-12 500 Mcg Tabs (Cyanocobalamin) .Marland Kitchen.. 1 By Mouth Once Daily For Vitamin B12 Deficiency 11)  Neomycin-Polymyxin-Gramicidin  Soln (Neomycin-Polymyx-Gramicid) .... Prn 12)  Antivert 25 Mg Tabs (Meclizine Hcl) .... As Needed For Vertigo 13)  Iron .... Once Daily 14)  Zofran 8 Mg Tabs  (Ondansetron Hcl) .Marland Kitchen.. 1 By Mouth Three Times A Day As Needed Vomiting  Allergies (verified): 1)  ! Penicillin 2)  ! Codeine 3)  ! Sulfa 4)  ! * Nyquil 5)  ! * Mucinex 6)  ! Tetracycline 7)  ! Morphine 8)  ! * Dilaudid 9)  ! Sudafed 10)  ! * Doxepin 11)  ! Vicodin 12)  ! Prednisone 13)  ! * Iv Iron 14)  ! * Protonix 15)  Metoprolol Succinate (Metoprolol Succinate)  Past History:  Past Medical History: Last updated: 08/21/2009 DVT, hx of H/o PE Cystic Lung Disease Headaches - migraines Allergic rhinitis Asthma GERD  Dr Madilyn Fireman T. versicolor Onycho Depression Palpitations Migraines Anxiety Vit B12 low normal 2011  Social History: Last updated: 05/26/2008 Occupation: Customer Service Single Never Smoked  Physical Exam  General:  NAD Ears:  R TM is red Mouth:  Erythematous throat mucosa and intranasal erythema.  Neck:  supple Lungs:  CTA Heart:  Regular rate and rhythm without murmurs or gallops noted. Normal S1,S2.   Abdomen:  S/NT Msk:  WNL Neurologic:  cranial nerves II-XII intact and strength normal in all extremities.   Skin:  Clear Psych:  Alert and cooperative; normal mood and affect; normal attention span and concentration.     Impression & Recommendations:  Problem #  1:  SINUSITIS- ACUTE-NOS (ICD-461.9) Assessment Deteriorated  Her updated medication list for this problem includes:    Levaquin 500 Mg Tabs (Levofloxacin) .Marland Kitchen... 1 by mouth qd    Tussionex Pennkinetic Er 8-10 Mg/45ml Lqcr (Chlorpheniramine-hydrocodone) .Marland KitchenMarland KitchenMarland KitchenMarland Kitchen 5 ml by mouth two times a day as needed cough  Problem # 2:  OTITIS MEDIA (ICD-382.9) Assessment: Deteriorated  Her updated medication list for this problem includes:    Levaquin 500 Mg Tabs (Levofloxacin) .Marland Kitchen... 1 by mouth qd  Problem # 3:  WEAKNESS (ICD-780.79) Assessment: Deteriorated To work on 11/12/09 if ok  Problem # 4:  ASTHMA (ICD-493.90) Assessment: Deteriorated  Her updated medication list for this problem  includes:    Prednisone 10 Mg Tabs (Prednisone) .Marland Kitchen... Take 40mg  qd for 3 days, then 20 mg qd for 3 days, then 10mg  qd for 6 days, then stop. take pc.  Complete Medication List: 1)  Zegerid 40-1100 Mg Caps (Omeprazole-sodium bicarbonate) .... Take 1 by mouth qd 2)  Penlac 8 % Soln (Ciclopirox) .... Use once daily x 12 mo 3)  Sumatriptan Succinate 100 Mg Tabs (Sumatriptan succinate) .Marland Kitchen.. 1 by mouth once daily 4)  Lexapro 10 Mg Tabs (Escitalopram oxalate) .Marland Kitchen.. 1 by mouth once daily 5)  Hydrochlorothiazide 25 Mg Tabs (Hydrochlorothiazide) .... Take 1 tab by mouth every morning 6)  Klor-con M20 20 Meq Cr-tabs (Potassium chloride crys cr) .Marland Kitchen.. 1 once daily 7)  Sumavel Dosepro 6 Mg/0.17ml Devi (Sumatriptan succinate) .... Use once daily prn 8)  Vitamin D3 1000 Unit Tabs (Cholecalciferol) .Marland Kitchen.. 1 by mouth daily 9)  Diazepam 2 Mg Tabs (Diazepam) .Marland Kitchen.. 1 by mouth three times a day as needed anxiety 10)  Vitamin B-12 500 Mcg Tabs (Cyanocobalamin) .Marland Kitchen.. 1 by mouth once daily for vitamin b12 deficiency 11)  Neomycin-polymyxin-gramicidin Soln (Neomycin-polymyx-gramicid) .... Prn 12)  Antivert 25 Mg Tabs (Meclizine hcl) .... As needed for vertigo 13)  Iron  .... Once daily 14)  Zofran 8 Mg Tabs (Ondansetron hcl) .Marland Kitchen.. 1 by mouth three times a day as needed vomiting 15)  Levaquin 500 Mg Tabs (Levofloxacin) .Marland Kitchen.. 1 by mouth qd 16)  Prednisone 10 Mg Tabs (Prednisone) .... Take 40mg  qd for 3 days, then 20 mg qd for 3 days, then 10mg  qd for 6 days, then stop. take pc. 17)  Tussionex Pennkinetic Er 8-10 Mg/16ml Lqcr (Chlorpheniramine-hydrocodone) .... 5 ml by mouth two times a day as needed cough  Patient Instructions: 1)  Use over-the-counter medicines for "cold": Tylenol  650mg  or Advil 400mg  every 6 hours  for fever; Delsym or Robutussin for cough. Mucinex or Mucinex D for congestion. Ricola or Halls for sore throat. Office visit if not better or if worse.  Prescriptions: TUSSIONEX PENNKINETIC ER 8-10 MG/5ML LQCR  (CHLORPHENIRAMINE-HYDROCODONE) 5 ml by mouth two times a day as needed cough  #100 ml x 0   Entered and Authorized by:   Tresa Garter MD   Signed by:   Tresa Garter MD on 11/10/2009   Method used:   Print then Give to Patient   RxID:   [id number] PREDNISONE 10 MG TABS (PREDNISONE) Take 40mg  qd for 3 days, then 20 mg qd for 3 days, then 10mg  qd for 6 days, then stop. Take pc.  #24 x 1   Entered and Authorized by:   Tresa Garter MD   Signed by:   Tresa Garter MD on 11/10/2009   Method used:   Electronically to        [facility]  [address] [phone]* (retail)       [address]       [place], [place]  [address]       Ph: [phone]       Fax: [phone]   RxID:   [id number] LEVAQUIN 500 MG TABS (LEVOFLOXACIN) 1 by mouth qd  #10 x 0   Entered and Authorized by:   Tresa Garter MD   Signed by:   Tresa Garter MD on 11/10/2009   Method used:   Electronically to        [facility]. [phone]* (retail)       [address]       [place], [place]  [address]       Ph: [phone]       Fax: [phone]   RxID:   [id number]

## 2010-07-20 NOTE — Procedures (Signed)
Summary: [facility]  ENDO/[facility]   Imported By: Lester [place] 07/06/2009 07:26:47  _____________________________________________________________________  External Attachment:    Type:   Image     Comment:   External Document

## 2010-07-20 NOTE — Assessment & Plan Note (Signed)
Summary: EAR INFECTION/ NECK PAIN/NWS   Vital Signs:  Patient profile:   40 year old female Height:      68 inches Weight:      132.50 pounds BMI:     20.22 O2 Sat:      99 % on Room air Temp:     97.6 degrees F oral Pulse rate:   86 / minute BP sitting:   110 / 70  (left arm) Cuff size:   regular  Vitals Entered By: Lucious Groves (October 01, 2009 2:11 PM)  O2 Flow:  Room air CC: Ear infection and neck pain x2 weeks, positive for "ringing" in the ears. ABX/Z Pak has not helped./kb Is Patient Diabetic? No Pain Assessment Patient in pain? yes     Location: neck Type: sharp Comments Patient notes that a PA did try to irrigate her ears and it caused some bleeding./kb   CC:  Ear infection and neck pain x2 weeks and positive for "ringing" in the ears. ABX/Z Pak has not helped./kb.  History of Present Illness: C/o R earache, ST -  took Zpac - better... She saw a NP at work and had her ears irrigated. F/HAs, GERD, B12 def.  Problems Prior to Update: 1)  Anxiety  (ICD-300.00) 2)  Vitamin D Deficiency  (ICD-268.9) 3)  Hypokalemia  (ICD-276.8) 4)  Migraine, Common W/intractable Migraine  (ICD-346.11) 5)  Vomiting  (ICD-787.03) 6)  Dizziness  (ICD-780.4) 7)  Fatigue  (ICD-780.79) 8)  Edema  (ICD-782.3) 9)  Unspecified Anemia  (ICD-285.9) 10)  Palpitations  (ICD-785.1) 11)  Upper Respiratory Infection (URI)  (ICD-465.9) 12)  Acute Bronchospasm  (ICD-519.11) 13)  Sinusitis- Acute-nos  (ICD-461.9) 14)  Depression  (ICD-311) 15)  Onychomycosis  (ICD-110.1) 16)  Migraine, Chronic  (ICD-346.90) 17)  Gerd  (ICD-530.81) 18)  Asthma  (ICD-493.90) 19)  Allergic Rhinitis  (ICD-477.9) 20)  Hemoptysis  (ICD-786.3) 21)  Dvt, Hx of  (ICD-V12.51)  Current Medications (verified): 1)  Zegerid 40-1100 Mg  Caps (Omeprazole-Sodium Bicarbonate) .... Take 1 By Mouth Qd 2)  Penlac 8 % Soln (Ciclopirox) .... Use Once Daily X 12 Mo 3)  Sumatriptan Succinate 100 Mg Tabs (Sumatriptan Succinate)  .Marland Kitchen.. 1 By Mouth Once Daily 4)  Lexapro 10 Mg Tabs (Escitalopram Oxalate) .Marland Kitchen.. 1 By Mouth Once Daily 5)  Hydrochlorothiazide 25 Mg  Tabs (Hydrochlorothiazide) .... Take 1 Tab By Mouth Every Morning 6)  Klor-Con M20 20 Meq Cr-Tabs (Potassium Chloride Crys Cr) .Marland Kitchen.. 1 Once Daily 7)  Sumavel Dosepro 6 Mg/0.34ml Devi (Sumatriptan Succinate) .... Use Once Daily Prn 8)  Vitamin D3 1000 Unit  Tabs (Cholecalciferol) .Marland Kitchen.. 1 By Mouth Daily 9)  Diazepam 2 Mg Tabs (Diazepam) .Marland Kitchen.. 1 By Mouth Three Times A Day As Needed Anxiety 10)  Vitamin B-12 500 Mcg Tabs (Cyanocobalamin) .Marland Kitchen.. 1 By Mouth Once Daily For Vitamin B12 Deficiency 11)  Iron .... Once Daily 12)  Neomycin-Polymyxin-Gramicidin  Soln (Neomycin-Polymyx-Gramicid) .... Prn 13)  Antivert 25 Mg Tabs (Meclizine Hcl) .... As Needed For Vertigo  Allergies (verified): 1)  ! Penicillin 2)  ! Codeine 3)  ! Sulfa 4)  ! * Nyquil 5)  ! * Mucinex 6)  ! Tetracycline 7)  ! Morphine 8)  ! * Dilaudid 9)  ! Sudafed 10)  ! * Doxepin 11)  ! Vicodin 12)  ! Prednisone 13)  ! * Iv Iron 14)  ! * Protonix 15)  Metoprolol Succinate (Metoprolol Succinate)  Past History:  Past Medical History: Last updated: 08/21/2009 DVT,  hx of H/o PE Cystic Lung Disease Headaches - migraines Allergic rhinitis Asthma GERD  Dr Madilyn Fireman T. 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  The patient denies fever.    Physical Exam  General:  Well developed, well nourished, in mild distress.  Eyes:  No corneal or conjunctival inflammation noted. EOMI. Perrla.l. Ears:  Wax B - I removed R OE Mouth:  no pharyngeal erythema and fair dentition.   Lungs:  Clear to auscultation bilaterally. Normal respiratory effort.  Heart:  Regular rate and rhythm without murmurs or  gallops noted. Normal S1,S2.   Abdomen:  S/NT Msk:  WNL Neurologic:  cranial nerves II-XII intact and strength normal in all extremities.   Skin:  Clear Psych:  Alert and cooperative; normal mood and affect; normal attention span and concentration.     Impression & Recommendations:  Problem # 1:  OTITIS EXTERNA (ICD-380.10) B Assessment New Cont Cortisporin Rx  Problem # 2:  DIZZINESS (ICD-780.4) Assessment: New  Her updated medication list for this problem includes:    Antivert 25 Mg Tabs (Meclizine hcl) .Marland Kitchen... As needed for vertigo    Zofran 8 Mg Tabs (Ondansetron hcl) .Marland Kitchen... 1 by mouth three times a day as needed vomiting  Problem # 3:  VITAMIN B12 DEFICIENCY (ICD-266.2) Assessment: Unchanged  Orders: Vit B12 1000 mcg (J3420) Admin of Therapeutic Inj  intramuscular or subcutaneous (16109)  Problem # 4:  MIGRAINE, COMMON W/INTRACTABLE MIGRAINE (ICD-346.11) Assessment: Comment Only Zofran prn The following medications were removed from the medication list:    Tramadol Hcl 50 Mg Tabs (Tramadol hcl) .Marland Kitchen... 1-2 by mouth two times a day as needed pain Her updated medication list for this problem includes:    Sumatriptan Succinate 100 Mg Tabs (Sumatriptan succinate) .Marland Kitchen... 1 by mouth once daily    Sumavel Dosepro 6 Mg/0.61ml Devi (Sumatriptan succinate) ..... Use once daily prn  Problem # 5:  CERUMEN IMPACTION (ICD-380.4) B Assessment: Deteriorated I removed the wax w/a loop  Complete Medication List: 1)  Zegerid 40-1100 Mg Caps (Omeprazole-sodium bicarbonate) .... Take 1 by mouth qd 2)  Penlac 8 % Soln (Ciclopirox) .... Use once daily x 12 mo 3)  Sumatriptan Succinate 100 Mg Tabs (Sumatriptan succinate) .Marland Kitchen.. 1 by mouth once daily 4)  Lexapro 10 Mg Tabs (Escitalopram oxalate) .Marland Kitchen.. 1 by mouth once daily 5)  Hydrochlorothiazide 25 Mg Tabs (Hydrochlorothiazide) .... Take 1 tab by mouth every morning 6)  Klor-con M20 20 Meq Cr-tabs (Potassium chloride crys cr) .Marland Kitchen.. 1 once daily 7)   Sumavel Dosepro 6 Mg/0.68ml Devi (Sumatriptan succinate) .... Use once daily prn 8)  Vitamin D3 1000 Unit Tabs (Cholecalciferol) .Marland Kitchen.. 1 by mouth daily 9)  Diazepam 2 Mg Tabs (Diazepam) .Marland Kitchen.. 1 by mouth three times a day as needed anxiety 10)  Vitamin B-12 500 Mcg Tabs (Cyanocobalamin) .Marland Kitchen.. 1 by mouth once daily for vitamin b12 deficiency 11)  Neomycin-polymyxin-gramicidin Soln (Neomycin-polymyx-gramicid) .... Prn 12)  Antivert 25 Mg Tabs (Meclizine hcl) .... As needed for vertigo 13)  Iron  .... Once daily 14)  Zofran 8 Mg Tabs (Ondansetron hcl) .Marland Kitchen.. 1 by mouth three times a day as needed vomiting  Patient Instructions: 1)  Please schedule a follow-up appointment in 3 months.  Prescriptions: ZOFRAN 8 MG TABS (ONDANSETRON HCL) 1 by mouth three times a day  as needed vomiting  #12 x 1   Entered and Authorized by:   Tresa Garter MD   Signed by:   Tresa Garter MD on 10/01/2009   Method used:   Print then Give to Patient   RxID:   [id number] ANTIVERT 25 MG TABS (MECLIZINE HCL) as needed for vertigo  #60 x 3   Entered and Authorized by:   Tresa Garter MD   Signed by:   Tresa Garter MD on 10/01/2009   Method used:   Print then Give to Patient   RxID:   [id number]    Medication Administration  Injection # 1:    Medication: Vit B12 1000 mcg    Diagnosis: VITAMIN B12 DEFICIENCY (ICD-266.2)    Route: IM    Site: R deltoid    Exp Date: 06/21/2011    Lot #: [id number]    Mfr: [employer]    Patient tolerated injection without complications    Given by: Lucious Groves (October 01, 2009 3:52 PM)  Orders Added: 1)  Vit B12 1000 mcg [J3420] 2)  Admin of Therapeutic Inj  intramuscular or subcutaneous [96372] 3)  Est. Patient Level IV [28315]

## 2010-07-20 NOTE — Assessment & Plan Note (Signed)
Summary: f.u appt per pt/#/cd   Vital Signs:  Patient profile:   40 year old female Weight:      128 pounds Temp:     98.3 degrees F oral Pulse rate:   79 / minute BP sitting:   108 / 78  (left arm)  Vitals Entered By: Tora Perches (August 21, 2009 10:42 AM) CC: f/u Is Patient Diabetic? No   CC:  f/u.  History of Present Illness: The patient presents for a follow up of back pain, anxiety, depression and headaches, uterine cramps/ spotting.   Preventive Screening-Counseling & Management  Alcohol-Tobacco     Smoking Status: never  Current Medications (verified): 1)  Zegerid 40-1100 Mg  Caps (Omeprazole-Sodium Bicarbonate) .... Take 1 By Mouth Qd 2)  Penlac 8 % Soln (Ciclopirox) .... Use Once Daily X 12 Mo 3)  Sumatriptan Succinate 100 Mg Tabs (Sumatriptan Succinate) .Marland Kitchen.. 1 By Mouth Once Daily 4)  Lexapro 10 Mg Tabs (Escitalopram Oxalate) .Marland Kitchen.. 1 By Mouth Once Daily 5)  Hydrochlorothiazide 25 Mg  Tabs (Hydrochlorothiazide) .... Take 1 Tab By Mouth Every Morning 6)  Klor-Con M20 20 Meq Cr-Tabs (Potassium Chloride Crys Cr) .Marland Kitchen.. 1 Once Daily 7)  Tramadol Hcl 50 Mg  Tabs (Tramadol Hcl) .Marland Kitchen.. 1-2 By Mouth Two Times A Day As Needed Pain 8)  Sumavel Dosepro 6 Mg/0.58ml Devi (Sumatriptan Succinate) .... Use Once Daily Prn 9)  Vitamin D3 1000 Unit  Tabs (Cholecalciferol) .Marland Kitchen.. 1 By Mouth Daily 10)  Iron .... Once Daily  Allergies: 1)  ! Penicillin 2)  ! Codeine 3)  ! Sulfa 4)  ! * Nyquil 5)  ! * Mucinex 6)  ! Tetracycline 7)  ! Morphine 8)  ! * Dilaudid 9)  ! Sudafed 10)  ! * Doxepin 11)  ! Vicodin 12)  ! Prednisone 13)  ! * Iv Iron 14)  ! 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 versicolor Onycho Depression Palpitations Migraines Anxiety Vit B12 low normal 2011  Past Surgical History: RL lobect (1991) S/P Welge resection hematoma (06/2204) Neg ANCA (05/2004) Neg Branch for AFB (05/2004) EDG (03/16/2006) Tubal ligation 2011 Dr Mysinger (and ablation)  Review of Systems       The patient complains of abnormal bleeding.  The patient denies fever, dyspnea on exertion, and abdominal pain.         vag bleed x 1 month  Physical Exam  General:  Well developed, well nourished, in mild distress.  Nose:  no external deformity, nasal dischargemucosal pallor, and mucosal erythema.   Mouth:  pharyngeal erythema and fair dentition.   Lungs:  Clear to auscultation bilaterally. Normal respiratory effort.  Heart:  Regular rate and rhythm without murmurs or gallops noted. Normal S1,S2.   Abdomen:  S/NT Msk:  WNL Neurologic:  cranial nerves II-XII intact and strength normal in all extremities.   Skin:  Clear Psych:  Alert and cooperative; normal mood and affect; normal attention span and concentration.     Impression & Recommendations:  Problem # 1:  MIGRAINE, COMMON W/INTRACTABLE MIGRAINE (ICD-346.11) Assessment Unchanged  Her updated medication list for this problem includes:    Sumatriptan Succinate 100 Mg Tabs (Sumatriptan succinate) .Marland Kitchen... 1 by mouth  once daily    Tramadol Hcl 50 Mg Tabs (Tramadol hcl) .Marland Kitchen... 1-2 by mouth two times a day as needed pain    Sumavel Dosepro 6 Mg/0.22ml Devi (Sumatriptan succinate) ..... Use once daily prn  Problem # 2:  HYPOKALEMIA (ICD-276.8) Assessment: New  Orders: TLB-B12, Serum-Total ONLY (95284-X32) TLB-BMP (Basic Metabolic Panel-BMET) (80048-METABOL) TLB-CBC Platelet - w/Differential (85025-CBCD) TLB-Hepatic/Liver Function Pnl (80076-HEPATIC) TLB-Sedimentation Rate (ESR) (85652-ESR) TLB-TSH (Thyroid Stimulating Hormone) (84443-TSH) TLB-Udip ONLY  (81003-UDIP)  Problem # 3:  FATIGUE (ICD-780.79)  Orders: TLB-B12, Serum-Total ONLY (44010-U72) TLB-BMP (Basic Metabolic Panel-BMET) (80048-METABOL) TLB-CBC Platelet - w/Differential (85025-CBCD) TLB-Hepatic/Liver Function Pnl (80076-HEPATIC) TLB-Sedimentation Rate (ESR) (85652-ESR) TLB-TSH (Thyroid Stimulating Hormone) (84443-TSH) TLB-Udip ONLY (81003-UDIP)  Problem # 4:  MIGRAINE, CHRONIC (ICD-346.90)  Her updated medication list for this problem includes:    Sumatriptan Succinate 100 Mg Tabs (Sumatriptan succinate) .Marland Kitchen... 1 by mouth once daily    Tramadol Hcl 50 Mg Tabs (Tramadol hcl) .Marland Kitchen... 1-2 by mouth two times a day as needed pain    Sumavel Dosepro 6 Mg/0.4ml Devi (Sumatriptan succinate) ..... Use once daily prn  Orders: TLB-B12, Serum-Total ONLY (53664-Q03) TLB-BMP (Basic Metabolic Panel-BMET) (80048-METABOL) TLB-CBC Platelet - w/Differential (85025-CBCD) TLB-Hepatic/Liver Function Pnl (80076-HEPATIC) TLB-Sedimentation Rate (ESR) (85652-ESR) TLB-TSH (Thyroid Stimulating Hormone) (84443-TSH) TLB-Udip ONLY (81003-UDIP)  Problem # 5:  ASTHMA (ICD-493.90) Needs handicapped sticker when hemopt  Problem # 6:  ANXIETY (ICD-300.00) Assessment: Deteriorated  Risks vs benefits and controversies of a long term controlled substances use were discussed.     Lexapro 10 Mg Tabs (Escitalopram oxalate) .Marland Kitchen... 1 by mouth once daily    Diazepam 2 Mg Tabs (Diazepam) .Marland Kitchen... 1 by mouth three times a day as needed anxiety  Orders: TLB-B12, Serum-Total ONLY (47425-Z56) TLB-BMP (Basic Metabolic Panel-BMET) (80048-METABOL) TLB-CBC Platelet - w/Differential (85025-CBCD) TLB-Hepatic/Liver Function Pnl (80076-HEPATIC) TLB-Sedimentation Rate (ESR) (85652-ESR) TLB-TSH (Thyroid Stimulating Hormone) (84443-TSH) TLB-Udip ONLY (81003-UDIP)  Problem # 7:  Low nl B12 Assessment: New See "Patient Instructions".   Complete Medication List: 1)  Zegerid 40-1100 Mg Caps (Omeprazole-sodium  bicarbonate) .... Take 1 by mouth qd 2)  Penlac 8 % Soln (Ciclopirox) .... Use once daily x 12 mo 3)  Sumatriptan Succinate 100 Mg Tabs (Sumatriptan succinate) .Marland Kitchen.. 1 by mouth once daily 4)  Lexapro 10 Mg Tabs (Escitalopram oxalate) .Marland Kitchen.. 1 by mouth once daily 5)  Hydrochlorothiazide 25 Mg Tabs (Hydrochlorothiazide) .... Take 1 tab by mouth every morning 6)  Klor-con M20 20 Meq Cr-tabs (Potassium chloride crys cr) .Marland Kitchen.. 1 once daily 7)  Tramadol Hcl 50 Mg Tabs (Tramadol hcl) .Marland Kitchen.. 1-2 by mouth two times a day as needed pain 8)  Sumavel Dosepro 6 Mg/0.58ml Devi (Sumatriptan succinate) .... Use once daily prn 9)  Vitamin D3 1000 Unit Tabs (Cholecalciferol) .Marland Kitchen.. 1 by mouth daily 10)  Diazepam 2 Mg Tabs (Diazepam) .Marland Kitchen.. 1 by mouth three times a day as needed anxiety 11)  Vitamin B-12 500 Mcg Tabs (Cyanocobalamin) .Marland Kitchen.. 1 by mouth once daily for vitamin b12 deficiency 12)  Iron  .... Once daily  Other Orders: Pneumococcal Vaccine (38756) Admin 1st Vaccine (43329) Admin 1st Vaccine [facility]) [id number])  Patient Instructions: 1)  Please schedule a follow-up appointment in 2 months. Prescriptions: VITAMIN B-12 500 MCG TABS (CYANOCOBALAMIN) 1 by mouth once daily for Vitamin B12 deficiency  #30 x 12   Entered and Authorized by:   Tresa Garter MD   Signed by:   Tresa Garter MD on 08/23/2009   Method used:   Print then Give  to Patient   RxID:   [id number] DIAZEPAM 2 MG TABS (DIAZEPAM) 1 by mouth three times a day as needed anxiety  #90 x 2   Entered and Authorized by:   Tresa Garter MD   Signed by:   Tresa Garter MD on 08/21/2009   Method used:   Print then Give to Patient   RxID:   [id number]    Pneumovax Vaccine    Vaccine Type: Pneumovax    Site: right deltoid    Mfr: Merck    Dose: 0.5 ml    Route: IM    Given by: Tora Perches    Exp. Date: 02/01/2011    Lot #: [id number]    VIS given: 01/16/96 version given August 21, 2009.

## 2010-07-20 NOTE — Progress Notes (Signed)
Summary: NOISE  Phone Note Call from Patient Call back at [phone] OK VM    Summary of Call: Pt wants to know how soon is ok to be around loud noise?  Initial call taken by: Lamar Sprinkles, CMA,  October 02, 2009 1:31 PM  Follow-up for Phone Call        next wk Follow-up by: Tresa Garter MD,  October 02, 2009 6:10 PM  Additional Follow-up for Phone Call Additional follow up Details #1::        Left vm  Additional Follow-up by: Lamar Sprinkles, CMA,  October 05, 2009 2:07 PM

## 2010-07-20 NOTE — Progress Notes (Signed)
Summary: Zpak  Phone Note Call from Patient Call back at [phone]   Summary of Call: Pt c/o sinus congestion and yellow drainage, & cough. Patient is requesting rx for zpak.  Initial call taken by: Lamar Sprinkles, CMA,  September 14, 2009 4:05 PM  Follow-up for Phone Call        ok OV if sick Follow-up by: Tresa Garter MD,  September 14, 2009 5:39 PM  Additional Follow-up for Phone Call Additional follow up Details #1::        Left mess for pt to check w/her pharm Additional Follow-up by: Lamar Sprinkles, CMA,  September 15, 2009 9:10 AM    New/Updated Medications: ZITHROMAX Z-PAK 250 MG TABS (AZITHROMYCIN) as dirrected Prescriptions: ZITHROMAX Z-PAK 250 MG TABS (AZITHROMYCIN) as dirrected  #1 x 0   Entered and Authorized by:   Tresa Garter MD   Signed by:   Lamar Sprinkles, CMA on 09/14/2009   Method used:   Electronically to        [facility]. [phone]* (retail)       [address]       [place], [place]  [address]       Ph: [phone]       Fax: [phone]   RxID:   [id number]

## 2010-07-20 NOTE — Progress Notes (Signed)
Summary: Rf Diazepam  Phone Note Refill Request Message from:  Pharmacy  Refills Requested: Medication #1:  DIAZEPAM 2 MG TABS 1 by mouth three times a day as needed anxiety   Dosage confirmed as above?Dosage Confirmed   Supply Requested: 90   Last Refilled: 01/18/2010  Method Requested: Telephone to Pharmacy Initial call taken by: Lanier Prude, [facility]),  February 25, 2010 9:17 AM  Follow-up for Phone Call        ok to ref x 3 Follow-up by: Tresa Garter MD,  February 25, 2010 12:38 PM    Prescriptions: DIAZEPAM 2 MG TABS (DIAZEPAM) 1 by mouth three times a day as needed anxiety  #90 x 2   Entered by:   Lamar Sprinkles, CMA   Authorized by:   Tresa Garter MD   Signed by:   Lamar Sprinkles, CMA on 02/25/2010   Method used:   Telephoned to ...       [facility]  [address] [phone]* (retail)       [address]       [place], [place]  [address]       Ph: [phone]       Fax: [phone]   RxID:   [id number]

## 2010-07-20 NOTE — Progress Notes (Signed)
Summary: REFERRALs  Phone Note Call from Patient   Summary of Call: Pt was checking status of ENT referral. Also wants referral for vein specialist.  Initial call taken by: Lamar Sprinkles, CMA,  December 04, 2009 11:19 AM  Follow-up for Phone Call        OK Dr Donia Ast - vein clinic [place] Follow-up by: Tresa Garter MD,  December 04, 2009 1:20 PM  New Problems: VENOUS INSUFFICIENCY, CHRONIC (ICD-459.81)   New Problems: VENOUS INSUFFICIENCY, CHRONIC (ICD-459.81)

## 2010-07-20 NOTE — Assessment & Plan Note (Signed)
Summary: earache/#?cd   Vital Signs:  Patient profile:   40 year old female Height:      68 inches Weight:      136 pounds BMI:     20.75 O2 Sat:      97 % on Room air Temp:     97.4 degrees F oral Pulse rate:   85 / minute BP sitting:   110 / 80  (left arm) Cuff size:   regular  Vitals Entered By: Lucious Groves (December 02, 2009 4:37 PM)  O2 Flow:  Room air CC: C/O earache x2.5 months as well as feet swelling x1 week./kb Is Patient Diabetic? No Pain Assessment Patient in pain? no      Comments Patient notes that she is not taking Penlac, Neomycin, Levaquin, Prednisone, or Tussionex./kb   CC:  C/O earache x2.5 months as well as feet swelling x1 week./kb.  History of Present Illness: C/o R ear is still hurting C/o B feet swell up a little x 1-2 wks C/o large thick big toenails C/o rash on skin - chronic  Current Medications (verified): 1)  Zegerid 40-1100 Mg  Caps (Omeprazole-Sodium Bicarbonate) .... Take 1 By Mouth Qd 2)  Penlac 8 % Soln (Ciclopirox) .... Use Once Daily X 12 Mo 3)  Sumatriptan Succinate 100 Mg Tabs (Sumatriptan Succinate) .Marland Kitchen.. 1 By Mouth Once Daily 4)  Lexapro 10 Mg Tabs (Escitalopram Oxalate) .Marland Kitchen.. 1 By Mouth Once Daily 5)  Hydrochlorothiazide 25 Mg  Tabs (Hydrochlorothiazide) .... Take 1 Tab By Mouth Every Morning 6)  Klor-Con M20 20 Meq Cr-Tabs (Potassium Chloride Crys Cr) .Marland Kitchen.. 1 Once Daily 7)  Sumavel Dosepro 6 Mg/0.70ml Devi (Sumatriptan Succinate) .... Use Once Daily Prn 8)  Vitamin D3 1000 Unit  Tabs (Cholecalciferol) .Marland Kitchen.. 1 By Mouth Daily 9)  Diazepam 2 Mg Tabs (Diazepam) .Marland Kitchen.. 1 By Mouth Three Times A Day As Needed Anxiety 10)  Vitamin B-12 500 Mcg Tabs (Cyanocobalamin) .Marland Kitchen.. 1 By Mouth Once Daily For Vitamin B12 Deficiency 11)  Neomycin-Polymyxin-Gramicidin  Soln (Neomycin-Polymyx-Gramicid) .... Prn 12)  Antivert 25 Mg Tabs (Meclizine Hcl) .... As Needed For Vertigo 13)  Iron .... Once Daily 14)  Zofran 8 Mg Tabs (Ondansetron Hcl) .Marland Kitchen.. 1 By Mouth  Three Times A Day As Needed Vomiting 15)  Levaquin 500 Mg Tabs (Levofloxacin) .Marland Kitchen.. 1 By Mouth Qd 16)  Prednisone 10 Mg Tabs (Prednisone) .... Take 40mg  Qd For 3 Days, Then 20 Mg Qd For 3 Days, Then 10mg  Qd For 6 Days, Then Stop. Take Pc. 17)  Tussionex Pennkinetic Er 8-10 Mg/26ml Lqcr (Chlorpheniramine-Hydrocodone) .... 5 Ml By Mouth Two Times A Day As Needed Cough  Allergies (verified): 1)  ! Penicillin 2)  ! Codeine 3)  ! Sulfa 4)  ! * Nyquil 5)  ! * Mucinex 6)  ! Tetracycline 7)  ! Morphine 8)  ! * Dilaudid 9)  ! Sudafed 10)  ! * Doxepin 11)  ! Vicodin 12)  ! Prednisone 13)  ! * Iv Iron 14)  ! * Protonix 15)  Metoprolol Succinate (Metoprolol Succinate)  Past History:  Past Medical History: Last updated: 08/21/2009 DVT, hx of H/o PE Cystic Lung Disease Headaches - migraines Allergic rhinitis Asthma GERD  Dr Madilyn Fireman T. 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    Physical Exam  General:  NAD Ears:  L, R TM is not red Wax in R ear extracted Mouth:  WNL Lungs:  CTA Heart:  Regular rate and rhythm without murmurs or gallops noted. Normal S1,S2.   Abdomen:  S/NT Msk:  WNL Neurologic:  cranial nerves II-XII intact and strength normal in all extremities.   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     Impression & Recommendations:  Problem # 1:  OTITIS EXTERNA (ICD-380.10) R w/wax Assessment Improved ENT if not better  Wax removed  Problem # 2:  EDEMA (ICD-782.3) Assessment: Deteriorated Cut back on salt Her updated medication list for this problem includes:    Hydrochlorothiazide 25 Mg Tabs  (Hydrochlorothiazide) .Marland Kitchen... Take 1 tab by mouth every morning    Furosemide 20 Mg Tabs (Furosemide) .Marland Kitchen... 1 by mouth qam as needed for swelling as needed  Problem # 3:  OTHER SPECIFIED DISEASE OF NAIL (ICD-703.8) Assessment: Deteriorated  Orders: Podiatry Referral (Podiatry)  Problem # 4:  ONYCHOMYCOSIS (ICD-110.1) Assessment: Deteriorated  Her updated medication list for this problem includes:    Penlac 8 % Soln (Ciclopirox) ..... Use once daily x 12 mo  Orders: Podiatry Referral (Podiatry)  Problem # 5:  TINEA CORPORIS (ICD-110.5) Assessment: Deteriorated  Orders: Dermatology Referral (Derma)  Complete Medication List: 1)  Zegerid 40-1100 Mg Caps (Omeprazole-sodium bicarbonate) .... Take 1 by mouth qd 2)  Penlac 8 % Soln (Ciclopirox) .... Use once daily x 12 mo 3)  Sumatriptan Succinate 100 Mg Tabs (Sumatriptan succinate) .Marland Kitchen.. 1 by mouth once daily 4)  Lexapro 10 Mg Tabs (Escitalopram oxalate) .Marland Kitchen.. 1 by mouth once daily 5)  Hydrochlorothiazide 25 Mg Tabs (Hydrochlorothiazide) .... Take 1 tab by mouth every morning 6)  Klor-con M20 20 Meq Cr-tabs (Potassium chloride crys cr) .Marland Kitchen.. 1 once daily 7)  Sumavel Dosepro 6 Mg/0.20ml Devi (Sumatriptan succinate) .... Use once daily prn 8)  Vitamin D3 1000 Unit Tabs (Cholecalciferol) .Marland Kitchen.. 1 by mouth daily 9)  Diazepam 2 Mg Tabs (Diazepam) .Marland Kitchen.. 1 by mouth three times a day as needed anxiety 10)  Vitamin B-12 500 Mcg Tabs (Cyanocobalamin) .Marland Kitchen.. 1 by mouth once daily for vitamin b12 deficiency 11)  Antivert 25 Mg Tabs (Meclizine hcl) .... As needed for vertigo 12)  Iron  .... Once daily 13)  Zofran 8 Mg Tabs (Ondansetron hcl) .Marland Kitchen.. 1 by mouth three times a day as needed vomiting 14)  Furosemide 20 Mg Tabs (Furosemide) .Marland Kitchen.. 1 by mouth qam as needed for swelling as needed  Patient Instructions: 1)  Use Debrox in ear as needed 2)  Call if you are not better in a reasonable amount of time or if worse.  Prescriptions: FUROSEMIDE 20 MG TABS  (FUROSEMIDE) 1 by mouth qam as needed for swelling as needed  #30 x 6   Entered and Authorized by:   Tresa Garter MD   Signed by:   Tresa Garter MD on 12/02/2009   Method used:   Electronically to        [facility]. [phone]* (retail)       [address]       [place], [place]  [address]       Ph: [phone]       Fax: [phone]   RxID:   [id number]

## 2010-07-21 ENCOUNTER — Telehealth: Payer: Self-pay | Admitting: Internal Medicine

## 2010-07-22 NOTE — Progress Notes (Signed)
  Phone Note Call from Patient Call back at [phone]   Caller: sister- Bonita Quin Summary of Call: Sister called [facility] stating that pt was d/c form hospital on 07/14/10 dx:"major infection", didnt see pneumonia. Initial call taken by: Rock Nephew CMA,  July 16, 2010 2:32 PM  Follow-up for Phone Call        See other phone note - I spoke w/pt and she is going back to ER Follow-up by: Lamar Sprinkles, CMA,  July 16, 2010 3:37 PM

## 2010-07-22 NOTE — Assessment & Plan Note (Signed)
Summary: cold sx's/cd   Vital Signs:  Patient profile:   40 year old female Height:      68 inches Weight:      130 pounds BMI:     19.84 Temp:     99.1 degrees F oral Pulse rate:   100 / minute Pulse rhythm:   regular Resp:     16 per minute BP sitting:   138 / 90  (left arm) Cuff size:   regular  Vitals Entered By: Lanier Prude, CMA(AAMA) (July 09, 2010 5:03 PM) CC: cough,fever, sweats, fatigue, chest tightness Is Patient Diabetic? No Comments pt is not taking Furosemide   CC:  cough, fever, sweats, fatigue, and chest tightness.  History of Present Illness: C/o fatigue since X-mas. She went to ER for bronchitis then by 911 due to a panic attack. CXR was OK. The patient presents with complaints of sore throat, fever, cough, sinus congestion and drainge of several wks duration. Not better with OTC meds. Chest hurts with coughing. Can't sleep due to cough. Muscle aches are present.  The mucus is colored.   Current Medications (verified): 1)  Zegerid 40-1100 Mg  Caps (Omeprazole-Sodium Bicarbonate) .... Take 1 By Mouth Qd 2)  Penlac 8 % Soln (Ciclopirox) .... Use Once Daily X 12 Mo 3)  Sumatriptan Succinate 100 Mg Tabs (Sumatriptan Succinate) .Marland Kitchen.. 1 By Mouth Once Daily 4)  Lexapro 10 Mg Tabs (Escitalopram Oxalate) .Marland Kitchen.. 1 By Mouth Once Daily 5)  Hydrochlorothiazide 25 Mg  Tabs (Hydrochlorothiazide) .... Take 1 Tab By Mouth Every Morning 6)  Klor-Con M20 20 Meq Cr-Tabs (Potassium Chloride Crys Cr) .Marland Kitchen.. 1 Once Daily 7)  Sumavel Dosepro 6 Mg/0.23ml Devi (Sumatriptan Succinate) .... Use Once Daily Prn 8)  Vitamin D3 1000 Unit  Tabs (Cholecalciferol) .Marland Kitchen.. 1 By Mouth Daily 9)  Diazepam 2 Mg Tabs (Diazepam) .Marland Kitchen.. 1 By Mouth Three Times A Day As Needed Anxiety 10)  Vitamin B-12 500 Mcg Tabs (Cyanocobalamin) .Marland Kitchen.. 1 By Mouth Once Daily For Vitamin B12 Deficiency 11)  Antivert 25 Mg Tabs (Meclizine Hcl) .... As Needed For Vertigo 12)  Iron .... Once Daily 13)  Zofran 8 Mg Tabs  (Ondansetron Hcl) .Marland Kitchen.. 1 By Mouth Three Times A Day As Needed Vomiting 14)  Furosemide 20 Mg Tabs (Furosemide) .Marland Kitchen.. 1 By Mouth Qam As Needed For Swelling As Needed  Allergies (verified): 1)  ! Penicillin 2)  ! Codeine 3)  ! Sulfa 4)  ! * Nyquil 5)  ! * Mucinex 6)  ! Tetracycline 7)  ! Morphine 8)  ! * Dilaudid 9)  ! Sudafed 10)  ! * Doxepin 11)  ! Prednisone 12)  ! * Iv Iron 13)  ! * Protonix 14)  Vicodin 15)  Metoprolol Succinate (Metoprolol Succinate)  Past History:  Past Medical History: Reviewed history from 08/21/2009 and no changes required. DVT, hx of H/o PE Cystic Lung Disease Headaches - migraines Allergic rhinitis Asthma GERD  Dr Madilyn Fireman T. 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  The patient denies fever, chest pain, and abdominal pain.         Fatigue  Physical Exam  General:  NAD Mouth:  WNL Lungs:  CTA Heart:  Regular rate and rhythm without murmurs or gallops noted. Normal S1,S2.   Abdomen:  S/NT Neurologic:  cranial nerves II-XII intact and strength normal in all extremities.   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     Impression & Recommendations:  Problem # 1:  FATIGUE (ICD-780.79) Assessment Deteriorated  Orders: TLB-B12, Serum-Total ONLY (52841-L24) TLB-BMP (Basic Metabolic Panel-BMET) (80048-METABOL) TLB-CBC Platelet - w/Differential (85025-CBCD) TLB-Hepatic/Liver Function Pnl (80076-HEPATIC) TLB-TSH (Thyroid Stimulating Hormone) (84443-TSH) TLB-Udip ONLY (81003-UDIP) T-Cortisol Total (40102-72536)  Problem # 2:  ASTHMA (ICD-493.90) Assessment: Deteriorated  Her updated medication list for this  problem includes:    Prednisone 10 Mg Tabs (Prednisone) .Marland Kitchen... Take 40mg  qd for 3 days, then 20 mg qd for 3 days, then 10mg  qd for 6 days, then stop. take pc.    Symbicort 160-4.5 Mcg/act Aero (Budesonide-formoterol fumarate) .Marland Kitchen... 2 inh two times a day  Problem # 3:  BRONCHITIS (ICD-490) Assessment: New  Her updated medication list for this problem includes:    Symbicort 160-4.5 Mcg/act Aero (Budesonide-formoterol fumarate) .Marland Kitchen... 2 inh two times a day    Tussionex Pennkinetic Er 8-10 Mg/33ml Lqcr (Chlorpheniramine-hydrocodone) .Marland KitchenMarland KitchenMarland KitchenMarland Kitchen 5 ml by mouth two times a day as needed cough    Avelox 400 Mg Tabs (Moxifloxacin hcl) .Marland Kitchen... 1 by mouth once daily x 10 d    Ciprofloxacin Hcl 500 Mg Tabs (Ciprofloxacin hcl) .Marland Kitchen... 1 by mouth bid  Orders: TLB-B12, Serum-Total ONLY (64403-K74) TLB-BMP (Basic Metabolic Panel-BMET) (80048-METABOL) TLB-CBC Platelet - w/Differential (85025-CBCD) TLB-Hepatic/Liver Function Pnl (80076-HEPATIC) TLB-TSH (Thyroid Stimulating Hormone) (84443-TSH) TLB-Udip ONLY (81003-UDIP) T-Cortisol Total (25956-38756)  Complete Medication List: 1)  Zegerid 40-1100 Mg Caps (Omeprazole-sodium bicarbonate) .... Take 1 by mouth qd 2)  Penlac 8 % Soln (Ciclopirox) .... Use once daily x 12 mo 3)  Sumatriptan Succinate 100 Mg Tabs (Sumatriptan succinate) .Marland Kitchen.. 1 by mouth once daily 4)  Lexapro 10 Mg Tabs (Escitalopram oxalate) .Marland Kitchen.. 1 by mouth once daily 5)  Hydrochlorothiazide 25 Mg Tabs (Hydrochlorothiazide) .... Take 1 tab by mouth every morning 6)  Klor-con M20 20 Meq Cr-tabs (Potassium chloride crys cr) .Marland Kitchen.. 1 once daily 7)  Sumavel Dosepro 6 Mg/0.12ml Devi (Sumatriptan succinate) .... Use once daily prn 8)  Vitamin D3 1000 Unit Tabs (Cholecalciferol) .Marland Kitchen.. 1 by mouth daily 9)  Diazepam 2 Mg Tabs (Diazepam) .Marland Kitchen.. 1 by mouth three times a day as needed anxiety 10)  Vitamin B-12 500 Mcg Tabs (Cyanocobalamin) .Marland Kitchen.. 1 by mouth once daily for vitamin b12 deficiency 11)  Antivert 25 Mg Tabs  (Meclizine hcl) .... As needed for vertigo 12)  Iron  .... Once daily 13)  Zofran 8 Mg Tabs (Ondansetron hcl) .Marland Kitchen.. 1 by mouth three times a day as needed vomiting 14)  Furosemide 20 Mg Tabs (Furosemide) .Marland Kitchen.. 1 by mouth qam as needed for swelling as needed 15)  Prednisone 10 Mg Tabs (Prednisone) .... Take 40mg  qd for 3 days, then 20 mg qd for 3 days, then 10mg  qd for 6 days, then stop. take pc. 16)  Symbicort 160-4.5 Mcg/act Aero (Budesonide-formoterol fumarate) .... 2 inh two times a day 17)  Tussionex Pennkinetic Er 8-10 Mg/12ml Lqcr (Chlorpheniramine-hydrocodone) .... 5 ml by mouth two times a day as needed cough 18)  Avelox 400 Mg Tabs (Moxifloxacin hcl) .Marland Kitchen.. 1 by mouth once daily x 10 d 19)  Ciprofloxacin Hcl 500 Mg Tabs (Ciprofloxacin hcl) .Marland Kitchen.. 1 by mouth bid  Patient Instructions: 1)  Call if you are not better in a reasonable amount of time  or if worse. Go to ER if feeling really bad!  Prescriptions: CIPROFLOXACIN HCL 500 MG TABS (CIPROFLOXACIN HCL) 1 by mouth bid  #20 x 1   Entered and Authorized by:   Tresa Garter MD   Signed by:   Tresa Garter MD on 07/09/2010   Method used:   Print then Give to Patient   RxID:   [id number] AVELOX 400 MG TABS (MOXIFLOXACIN HCL) 1 by mouth once daily x 10 d  #10 x 0   Entered and Authorized by:   Tresa Garter MD   Signed by:   Tresa Garter MD on 07/09/2010   Method used:   Print then Give to Patient   RxID:   [id number] TUSSIONEX PENNKINETIC ER 8-10 MG/5ML LQCR (CHLORPHENIRAMINE-HYDROCODONE) 5 ml by mouth two times a day as needed cough  #100 ml x 0   Entered and Authorized by:   Tresa Garter MD   Signed by:   Tresa Garter MD on 07/09/2010   Method used:   Print then Give to Patient   RxID:   [id number] SYMBICORT 160-4.5 MCG/ACT AERO (BUDESONIDE-FORMOTEROL FUMARATE) 2 inh two times a day  #1 x 3   Entered and Authorized by:   Tresa Garter MD   Signed by:   Tresa Garter MD  on 07/09/2010   Method used:   Print then Give to Patient   RxID:   [id number] PREDNISONE 10 MG TABS (PREDNISONE) Take 40mg  qd for 3 days, then 20 mg qd for 3 days, then 10mg  qd for 6 days, then stop. Take pc.  #24 x 1   Entered and Authorized by:   Tresa Garter MD   Signed by:   Tresa Garter MD on 07/09/2010   Method used:   Print then Give to Patient   RxID:   [id number]    Orders Added: 1)  TLB-B12, Serum-Total ONLY [82607-B12] 2)  TLB-BMP (Basic Metabolic Panel-BMET) [80048-METABOL] 3)  TLB-CBC Platelet - w/Differential [85025-CBCD] 4)  TLB-Hepatic/Liver Function Pnl [80076-HEPATIC] 5)  TLB-TSH (Thyroid Stimulating Hormone) [84443-TSH] 6)  TLB-Udip ONLY [81003-UDIP] 7)  T-Cortisol Total [82533-23720] 8)  Est. Patient Level III [93235]

## 2010-07-22 NOTE — Progress Notes (Signed)
Summary: REQ FOR ABX  Phone Note Call from Patient Call back at [phone]   Summary of Call: Pt was seen in the ER last week. Taken by ambulance, given breathing treatment, coughing up blood & had panic attack. She was given valium & phenergan while at ER. Pt states CXR was normal but respiratory therapist told pt she had some congestion in her lungs. She was given rx for phenergan from ER.  Patient is requesting rx for antibiotic, stronger than a ZPAK. She has productive cough w/yellow mucus. Pt is unable to come into the office b/c she does not have health insurance. Please advise.  Initial call taken by: Lamar Sprinkles, CMA,  June 15, 2010 4:23 PM  Follow-up for Phone Call        OK Cipro OV if not better Follow-up by: Tresa Garter MD,  June 15, 2010 5:11 PM  Additional Follow-up for Phone Call Additional follow up Details #1::        no answer...........Marland KitchenLamar Sprinkles, CMA  June 15, 2010 5:22 PM   Pt advised Additional Follow-up by: Margaret Pyle, CMA,  June 16, 2010 9:44 AM    Additional Follow-up for Phone Call Additional follow up Details #2::    pt informed of MD's advisement Follow-up by: Brenton Grills CMA [facility]),  June 16, 2010 9:45 AM  New/Updated Medications: CIPROFLOXACIN HCL 500 MG TABS (CIPROFLOXACIN HCL) 1 by mouth bid Prescriptions: CIPROFLOXACIN HCL 500 MG TABS (CIPROFLOXACIN HCL) 1 by mouth bid  #20 x 1   Entered and Authorized by:   Tresa Garter MD   Signed by:   Lamar Sprinkles, CMA on 06/15/2010   Method used:   Electronically to        [facility]. [phone]* (retail)       [address]       [place], [place]  [address]       Ph: [phone]       Fax: [phone]   RxID:   [id number]

## 2010-07-22 NOTE — Progress Notes (Signed)
Summary: Mauri [place]??  Phone Note Call from Patient   Caller: Patient Summary of Call: pt left Vm stating she is fatigued, sweating, congestion and is no better after abx. I advised appt tom at 4:30. Pt agrees but is questioning about payment arrangements due to no job or ins. I advised her I am unsure of her specific situation but will ask scheduler tom am and inform her what may be the expected payment for her when she comes in. Initial call taken by: Lanier Prude, [facility]),  July 08, 2010 5:29 PM  Follow-up for Phone Call        per Hilarie Fredrickson (scheduler) pt would be expected to bring approx $125.00 for self pay. Called pt at [phone] to inform of this.....Marland Kitchenno answer...left mess for pt to call back  Follow-up by: Lanier Prude, [facility]),  July 09, 2010 8:39 AM  Additional Follow-up for Phone Call Additional follow up Details #1::        pt informed and understands and scheduled for today at 4:30. Additional Follow-up by: Lanier Prude, [facility]),  July 09, 2010 11:32 AM

## 2010-07-22 NOTE — Progress Notes (Signed)
  Phone Note Call from Patient   Summary of Call: Pt left vm - she is extremely weak and wants MD to review what was done at the hospital. Wants MD's advice on how to not feel to tired. She was given 3 avelox to take my mouth.  Initial call taken by: Lamar Sprinkles, CMA,  July 15, 2010 2:38 PM  Follow-up for Phone Call        Going to ER - see phone note from today Follow-up by: Lamar Sprinkles, CMA,  July 16, 2010 3:37 PM

## 2010-07-22 NOTE — Progress Notes (Signed)
Summary: NEEDS OV  Phone Note Other Incoming   Caller: Pts Sister Details for Reason: Reg. Antibiotic Summary of Call: Pt sister called stating that the pt antiobiotic did not help her. She wants a new medication. I looked back in pts chart and she has not been seen since June 11. Tried to call pt to set up appt I could not reach her through any of the provided numbers. If she calls she will need an appt with Dr Macario Golds next week. Initial call taken by: Ami Bullins CMA,  June 25, 2010 2:38 PM

## 2010-07-22 NOTE — Progress Notes (Signed)
Summary: FYI  Phone Note Call from Patient   Caller: sister Bonita Quin Call For: Tresa Garter MD Summary of Call: Sister took pt to [facility] ER Sunday and they admitted her. Told her she has pnuemonia and her WBC is 24,000. They will be keeping her for a few days. Initial call taken by: Lanier Prude, [facility]),  July 12, 2010 10:44 AM  Follow-up for Phone Call        noted Thank you!  Follow-up by: Tresa Garter MD,  July 12, 2010 6:05 PM

## 2010-07-23 NOTE — H&P (Signed)
[name], [name] NO.:  [id number]  MEDICAL RECORD NO.:  [id number]          PATIENT TYPE:  EMS  LOCATION:  ED                           FACILITY:  [facility]  PHYSICIAN:  Hilary Hertz, MD      DATE OF BIRTH:  Mar 27, 1971  DATE OF ADMISSION:  07/11/2010 DATE OF DISCHARGE:                             HISTORY & PHYSICAL   PRIMARY CARE PHYSICIAN:  Dr.  Posey Rea  PULMONOLOGIST:  Dr. Ramond Dial at [facility].  CHIEF COMPLAINT:  Cough and shortness of breath.  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  She has had bilateral lower lobectomies.  She has also had recurrent PEs and DVTs, for which she completed a course of anticoagulation and a history of multiple pneumonias and yearly episodes of bronchitis who presents with shortness of breath and cough for the past month.  Per the patient's history, she came to the emergency room June 08, 2010, about 1 month ago, for coughing up blood and having a panic attack.  She was not admitted at that time.  Since that initial episode, she has had a persistent nonproductive cough for the past month that has not gone away.  She has also had night sweats about twice a week.  She has thought that she has had the flu and she has been taking Tylenol around the clock.  She did report getting her pneumonia vaccine this year.  In addition to the above, she reports that she has lost 8 pounds in about a month and she reports that she is eating less due to being sick and also due to her mood is being down.  She has a history of depression, which she is currently not on any medications for and she has had a lot of stressful events in the course of 2011.  Last time she was in the hospital, she talked to someone about resources for counseling, which she has not been able to go to yet.  She has also had fatigue.  She is  sleeping about 12 hours a day at this time.  She also reports being short of breath "all the time" and having chest tightness. She is using her albuterol MDI twice a day and this does help the symptoms of chest tightness and the shortness of breath, but not the cough.  She also reports that at this time she is currently laid off her job and she does not have insurance, so being in the hospital is also another stress for her.  She reports that her episodes of bronchitis that occur each year usually get better with antibiotics, but this episode has persisted and she is concerned that there is something going on.  She completed a course of azithromycin and she is currently on Cipro this month for the same symptoms and her symptoms have not improved.  She was also just started on prednisone for her symptoms yesterday, but she does not know the dose.  She was supposed to be on a taper over the next  week to 10 days.  Regarding her depression, she reports that she is depressed and down, but she is not suicidal.  She has no other acute complaints.  She has no further hemoptysis other than that initial episode December 20.  REVIEW OF SYSTEMS:  Review of 10-organ systems was done and it is negative except as stated above in the HPI.  ALLERGIES: 1. IRON. 2. NYQUIL. 3. PENICILLIN (hives). 4. TETRACYCLINE (hives). 5. VICODIN. 6. CODEINE. 7. SULFA (hives). 8. GUAIFENESIN. 9. MORPHINE. 10.DILAUDID. 11.SUDAFED. 12.DOXEPIN. 13.PROTONIX.  MEDICATIONS: 1. Cipro 500 twice a day. 2. Albuterol as needed. 3. Prednisone unknown dose. 4. Hydrochlorothiazide 25 mg daily. 5. B12 daily. 6. Valium 2 mg as needed for anxiety. 7. Zegerid 40 mg twice a day. 8. Potassium chloride 20 mEq daily. 9. Vitamin D daily. 10.Over-the-counter iron daily.  PAST MEDICAL HISTORY: 1. PEs. 2. DVTs. 3. Multiple pneumonias. 4. Chronic bronchitis. 5. Migraines. 6. GERD. 7. Barrett esophagus. 8. 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  The right lower lobe was removed in 1991 and left     lower lobe was removed in 2006.  SOCIAL HISTORY:  Nonsmoker, although she was exposed to secondhand smoke via her father for many years.  Currently lives with her sister.  FAMILY HISTORY:  Father died of COPD.  PHYSICAL EXAMINATION:  VITAL SIGNS:  164/119, 111, 22, 97.6, 97% on room air. GENERAL:  The patient is in no acute distress. HEENT:  Mucous membranes moist.  Sclerae anicteric. CARDIOVASCULAR:  Tachy.  No murmurs. LUNGS:  Decreased breath sounds bilateral bases, otherwise clear.  No wheezes or rhonchi.  ABDOMEN:  Soft, nontender, nondistended. EXTREMITIES:  No lower extremity edema.  Multiple vein varicosities are noted bilaterally.  NEUROLOGIC:  Cranial nerves II-XII grossly intact. The patient is a bit anxious and tangential during the interview. SKIN:  No rashes. PSYCHIATRIC:  Appropriate, but anxious.  LABORATORY DATA:  Sodium 139, potassium 4.0, chloride 100, bicarb 26, BUN 12, creatinine 1.09, glucose 115, AST 29, ALT 11, albumin 4.4, calcium 10.7.  CBC:  White count 24.8, hemoglobin 15, platelets 340. Urinalysis:  Negative for nitrites, negative for leukocyte esterase. Chest x-ray:  Pulmonary scarring and postoperative changes, some emphysema, no active process evident.  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 1. Cough and shortness of breath.  At this time, the etiology of the     patient's symptoms is unclear.  She has completed 2 courses of     antibiotics over the past month for this without improvement of her     symptoms.  Her chest x-ray does not show frank infiltrate.   She has     gotten moxifloxacin in the ED.  We will continue this for now as     the patient has not been hospitalized recently and we can treat this as possible community acquired PNA.  We will also     continue the patient's prednisone to see if this will help her     symptoms of the shortness of breath and chest tightness.  At this     time, given the patient's history and the chronicity of symptoms, I     would like to get the input of Pulmonary.  We will call them in the     morning.  The patient was previously a patient of [facility]     and she has had a falling out with them and does not want to be seen in that     practice anymore.  She would like to be referred to a new practice     and I think that would be reasonable.  They may want a CT chest or     a bronchoscopy or additional testing given the patient's extensive     history.  Given the patient's leukocytosis, low-grade fevers at     home, recent weight loss, and recent hemoptysis, we will also     consider doing TB testing.  We will do complete infectious workup     with blood cultures and urine cultures. 2. Depression/anxiety.  These have been recurrent issues for the     patient exacerbated by her recent illness and being laid off from     her job.  She was seen by social services last time she was here     and was given a list of people to see regarding counseling.  I     think it would be reasonable to have them come by again to discuss     to provide support to the patient.  We can call them in the     morning.  At this time, we will continue her Valium as needed for     anxiety, but we may have to start the patient on something for her     depression. 3. Hypertension.  We will continue the patient's home dose     hydrochlorothiazide. 4. Gastroesophageal reflux disease with Barrett esophagus.  We will     continue the patient's home Zegerid. 5. Lung blebs, status post bilateral lower lobectomies.  The  patient     needs to reestablish with Pulmonary and needs to be followed in     their clinic.  We will facilitate this while the patient is in the     hospital. 6. Fluids, electrolytes, nutrition.  Hep-Lock IV.  Replete     electrolytes as needed.  Put the patient on a regular diet. 7. Prophylaxis.  Lovenox for deep venous thrombosis prophylaxis.  DISPOSITION:  The patient will be admitted for IV antibiotics and evaluation by Pulmonary.          ______________________________ Hilary Hertz, MD     JF/MEDQ  D:  07/11/2010  T:  07/11/2010  Job:  [id number]  Electronically Signed by Hilary Hertz MD on 07/23/2010 11:46:32 PM

## 2010-07-28 NOTE — Progress Notes (Signed)
Summary: CELEXA  Phone Note Call from Patient   Caller: [phone] Summary of Call: ER gave pt celexa - Pt c/o sleepyness since on med. Sleeping 12 hours at a time. Also c/o pain in her forehead off and on ? from med? Initial call taken by: Lamar Sprinkles, CMA,  July 21, 2010 12:05 PM  Follow-up for Phone Call        Possible. Try to take 1/2 tab at hs. Follow-up by: Tresa Garter MD,  July 21, 2010 4:56 PM  Additional Follow-up for Phone Call Additional follow up Details #1::        Pt informed  Additional Follow-up by: Lamar Sprinkles, CMA,  July 21, 2010 5:12 PM

## 2010-07-28 NOTE — Progress Notes (Signed)
Summary: TO ER  Phone Note Call from Patient Call back at Home Phone [phone]   Caller: [phone] Summary of Call: Pt released from [facility] 1/25, not feeling well, deep cough, feels like she needs to be re-admitted, pt wants to know if Dr Posey Rea advises her to return?   Follow-up for Phone Call        Spoke w/pt - advised ER for eval. She states she feels worse and agrees to go back to ER.  Follow-up by: Lamar Sprinkles, CMA,  July 16, 2010 3:36 PM  Additional Follow-up for Phone Call Additional follow up Details #1::        agree Thank you!  Additional Follow-up by: Tresa Garter MD,  July 17, 2010 10:14 PM

## 2010-08-04 ENCOUNTER — Emergency Department (HOSPITAL_COMMUNITY)
Admission: EM | Admit: 2010-08-04 | Discharge: 2010-08-04 | Disposition: A | Discharge status: Home / Self Care | Payer: Self-pay | Attending: Emergency Medicine | Admitting: Emergency Medicine

## 2010-08-04 ENCOUNTER — Emergency Department (HOSPITAL_COMMUNITY): Payer: Self-pay

## 2010-08-04 DIAGNOSIS — R5383 Other fatigue: Secondary | ICD-10-CM | POA: Insufficient documentation

## 2010-08-04 DIAGNOSIS — R059 Cough, unspecified: Secondary | ICD-10-CM | POA: Insufficient documentation

## 2010-08-04 DIAGNOSIS — R21 Rash and other nonspecific skin eruption: Secondary | ICD-10-CM | POA: Insufficient documentation

## 2010-08-04 DIAGNOSIS — R0989 Other specified symptoms and signs involving the circulatory and respiratory systems: Secondary | ICD-10-CM | POA: Insufficient documentation

## 2010-08-04 DIAGNOSIS — R0609 Other forms of dyspnea: Secondary | ICD-10-CM | POA: Insufficient documentation

## 2010-08-04 DIAGNOSIS — E876 Hypokalemia: Secondary | ICD-10-CM | POA: Insufficient documentation

## 2010-08-04 DIAGNOSIS — F3289 Other specified depressive episodes: Secondary | ICD-10-CM | POA: Insufficient documentation

## 2010-08-04 DIAGNOSIS — R5381 Other malaise: Secondary | ICD-10-CM | POA: Insufficient documentation

## 2010-08-04 DIAGNOSIS — R05 Cough: Secondary | ICD-10-CM | POA: Insufficient documentation

## 2010-08-04 DIAGNOSIS — I10 Essential (primary) hypertension: Secondary | ICD-10-CM | POA: Insufficient documentation

## 2010-08-04 DIAGNOSIS — R0602 Shortness of breath: Secondary | ICD-10-CM | POA: Insufficient documentation

## 2010-08-04 DIAGNOSIS — F329 Major depressive disorder, single episode, unspecified: Secondary | ICD-10-CM | POA: Insufficient documentation

## 2010-08-04 LAB — URINALYSIS, ROUTINE W REFLEX MICROSCOPIC
Bilirubin Urine: NEGATIVE
Hgb urine dipstick: NEGATIVE
Ketones, ur: NEGATIVE mg/dL
Nitrite: NEGATIVE
Protein, ur: NEGATIVE mg/dL
Specific Gravity, Urine: 1.008 (ref 1.005–1.030)
Urine Glucose, Fasting: NEGATIVE mg/dL
Urine Glucose, Fasting: NEGATIVE mg/dL
pH: 7 (ref 5.0–8.0)
pH: 7.5 (ref 5.0–8.0)

## 2010-08-04 LAB — COMPREHENSIVE METABOLIC PANEL
Albumin: 3.7 g/dL (ref 3.5–5.2)
Alkaline Phosphatase: 55 U/L (ref 39–117)
BUN: 11 mg/dL (ref 6–23)
Calcium: 10.1 mg/dL (ref 8.4–10.5)
Glucose, Bld: 80 mg/dL (ref 70–99)
Potassium: 2.9 mEq/L — ABNORMAL LOW (ref 3.5–5.1)
Sodium: 140 mEq/L (ref 135–145)
Total Protein: 7.2 g/dL (ref 6.0–8.3)

## 2010-08-04 LAB — DIFFERENTIAL
Lymphs Abs: 3.3 10*3/uL (ref 0.7–4.0)
Monocytes Absolute: 0.9 10*3/uL (ref 0.1–1.0)
Monocytes Relative: 8 % (ref 3–12)
Neutro Abs: 7 10*3/uL (ref 1.7–7.7)
Neutrophils Relative %: 62 % (ref 43–77)

## 2010-08-04 LAB — CBC
Hemoglobin: 13.4 g/dL (ref 12.0–15.0)
MCH: 28.9 pg (ref 26.0–34.0)
MCV: 84.9 fL (ref 78.0–100.0)
RBC: 4.63 MIL/uL (ref 3.87–5.11)
WBC: 11.4 10*3/uL — ABNORMAL HIGH (ref 4.0–10.5)

## 2010-08-04 LAB — URINE MICROSCOPIC-ADD ON

## 2010-08-30 LAB — COMPREHENSIVE METABOLIC PANEL
Albumin: 4 g/dL (ref 3.5–5.2)
BUN: 16 mg/dL (ref 6–23)
CO2: 26 mEq/L (ref 19–32)
Calcium: 9.2 mg/dL (ref 8.4–10.5)
Chloride: 105 mEq/L (ref 96–112)
Creatinine, Ser: 1.01 mg/dL (ref 0.4–1.2)
GFR calc non Af Amer: >60 mL/min (ref >60)
Total Bilirubin: 0.6 mg/dL (ref 0.3–1.2)

## 2010-08-30 LAB — CBC
HCT: 46.7 % — ABNORMAL HIGH (ref 36.0–46.0)
Hemoglobin: 15.9 g/dL — ABNORMAL HIGH (ref 12.0–15.0)
MCH: 29.6 pg (ref 26.0–34.0)
MCHC: 34 g/dL (ref 30.0–36.0)
MCV: 86.8 fL (ref 78.0–100.0)
RDW: 12.9 % (ref 11.5–15.5)

## 2010-08-30 LAB — POCT I-STAT, CHEM 8
Calcium, Ion: 0.94 mmol/L — ABNORMAL LOW (ref 1.12–1.32)
Chloride: 106 mEq/L (ref 96–112)
Glucose, Bld: 134 mg/dL — ABNORMAL HIGH (ref 70–99)
HCT: 49 % — ABNORMAL HIGH (ref 36.0–46.0)
TCO2: 22 mmol/L (ref 0–100)

## 2010-08-30 LAB — DIFFERENTIAL
Basophils Absolute: 0 10*3/uL (ref 0.0–0.1)
Basophils Relative: 0 % (ref 0–1)
Eosinophils Absolute: 0.1 10*3/uL (ref 0.0–0.7)
Eosinophils Relative: 1 % (ref 0–5)
Monocytes Absolute: 0.8 10*3/uL (ref 0.1–1.0)
Monocytes Relative: 5 % (ref 3–12)
Neutro Abs: 9.1 10*3/uL — ABNORMAL HIGH (ref 1.7–7.7)

## 2010-08-30 LAB — RAPID URINE DRUG SCREEN, HOSP PERFORMED
Amphetamines: NOT DETECTED
Barbiturates: NOT DETECTED
Benzodiazepines: POSITIVE — AB
Cocaine: NOT DETECTED
Opiates: NOT DETECTED

## 2010-09-03 LAB — COMPREHENSIVE METABOLIC PANEL
ALT: 19 U/L (ref 0–35)
AST: 26 U/L (ref 0–37)
Alkaline Phosphatase: 57 U/L (ref 39–117)
CO2: 30 mEq/L (ref 19–32)
Chloride: 99 mEq/L (ref 96–112)
GFR calc Af Amer: >60 mL/min (ref >60)
GFR calc non Af Amer: >60 mL/min (ref >60)
Sodium: 139 mEq/L (ref 135–145)
Total Bilirubin: 1.2 mg/dL (ref 0.3–1.2)

## 2010-09-03 LAB — DIFFERENTIAL
Basophils Absolute: 0.2 10*3/uL — ABNORMAL HIGH (ref 0.0–0.1)
Basophils Relative: 1 % (ref 0–1)
Eosinophils Absolute: 0.1 10*3/uL (ref 0.0–0.7)
Eosinophils Relative: 1 % (ref 0–5)

## 2010-09-03 LAB — URINALYSIS, ROUTINE W REFLEX MICROSCOPIC
Glucose, UA: NEGATIVE mg/dL
Ketones, ur: NEGATIVE mg/dL
Nitrite: NEGATIVE
Protein, ur: NEGATIVE mg/dL

## 2010-09-03 LAB — CBC
HCT: 38.6 % (ref 36.0–46.0)
Hemoglobin: 13.4 g/dL (ref 12.0–15.0)
RBC: 4.3 MIL/uL (ref 3.87–5.11)

## 2010-09-04 LAB — CK TOTAL AND CKMB (NOT AT ARMC): CK, MB: 1.9 ng/mL (ref 0.3–4.0)

## 2010-09-06 LAB — CBC
HCT: 44.9 % (ref 36.0–46.0)
Hemoglobin: 14.8 g/dL (ref 12.0–15.0)
RDW: 14.7 % (ref 11.5–15.5)

## 2010-09-06 LAB — BASIC METABOLIC PANEL
GFR calc non Af Amer: >60 mL/min (ref >60)
Glucose, Bld: 96 mg/dL (ref 70–99)
Potassium: 2.8 mEq/L — ABNORMAL LOW (ref 3.5–5.1)
Sodium: 138 mEq/L (ref 135–145)

## 2010-09-06 LAB — HEMOGLOBIN AND HEMATOCRIT, BLOOD: HCT: 37.8 % (ref 36.0–46.0)

## 2010-09-13 LAB — DIFFERENTIAL
Basophils Relative: 1 % (ref 0–1)
Eosinophils Absolute: 0.1 10*3/uL (ref 0.0–0.7)
Monocytes Relative: 7 % (ref 3–12)
Neutro Abs: 8 10*3/uL — ABNORMAL HIGH (ref 1.7–7.7)
Neutrophils Relative %: 70 % (ref 43–77)

## 2010-09-13 LAB — URINALYSIS, ROUTINE W REFLEX MICROSCOPIC
Nitrite: NEGATIVE
Specific Gravity, Urine: 1.006 (ref 1.005–1.030)
pH: 7 (ref 5.0–8.0)

## 2010-09-13 LAB — URINE MICROSCOPIC-ADD ON

## 2010-09-13 LAB — BASIC METABOLIC PANEL
BUN: 9 mg/dL (ref 6–23)
Calcium: 9.2 mg/dL (ref 8.4–10.5)
GFR calc non Af Amer: >60 mL/min (ref >60)
Glucose, Bld: 107 mg/dL — ABNORMAL HIGH (ref 70–99)

## 2010-09-13 LAB — CBC
MCHC: 32.8 g/dL (ref 30.0–36.0)
Platelets: 307 10*3/uL (ref 150–400)
RBC: 4.71 MIL/uL (ref 3.87–5.11)
WBC: 11.4 10*3/uL — ABNORMAL HIGH (ref 4.0–10.5)

## 2010-09-26 LAB — HEMOCCULT GUIAC POC 1CARD (OFFICE): Fecal Occult Bld: NEGATIVE

## 2010-09-27 LAB — URINALYSIS, ROUTINE W REFLEX MICROSCOPIC
Bilirubin Urine: NEGATIVE
Glucose, UA: NEGATIVE mg/dL
Hgb urine dipstick: NEGATIVE
Ketones, ur: NEGATIVE mg/dL
Nitrite: NEGATIVE
Protein, ur: NEGATIVE mg/dL
Specific Gravity, Urine: 1.012 (ref 1.005–1.030)
Urobilinogen, UA: 1 mg/dL (ref 0.0–1.0)
pH: 7.5 (ref 5.0–8.0)

## 2010-09-27 LAB — URINE CULTURE: Colony Count: >=100000

## 2010-09-27 LAB — CBC
HCT: 36 % (ref 36.0–46.0)
HCT: 33.3 % — ABNORMAL LOW (ref 36.0–46.0)
Hemoglobin: 11.2 g/dL — ABNORMAL LOW (ref 12.0–15.0)
Hemoglobin: 10.4 g/dL — ABNORMAL LOW (ref 12.0–15.0)
MCHC: 31.3 g/dL (ref 30.0–36.0)
MCHC: 31.2 g/dL (ref 30.0–36.0)
MCV: 71.2 fL — ABNORMAL LOW (ref 78.0–100.0)
Platelets: 395 10*3/uL (ref 150–400)
RBC: 5.05 MIL/uL (ref 3.87–5.11)
RBC: 4.71 MIL/uL (ref 3.87–5.11)
RDW: 16.5 % — ABNORMAL HIGH (ref 11.5–15.5)
WBC: 9.6 K/uL (ref 4.0–10.5)

## 2010-09-27 LAB — DIFFERENTIAL
Basophils Absolute: 0.1 10*3/uL (ref 0.0–0.1)
Basophils Relative: 1 % (ref 0–1)
Basophils Relative: 0 % (ref 0–1)
Eosinophils Absolute: 0.1 10*3/uL (ref 0.0–0.7)
Eosinophils Relative: 1 % (ref 0–5)
Eosinophils Relative: 1 % (ref 0–5)
Lymphocytes Relative: 29 % (ref 12–46)
Lymphocytes Relative: 18 % (ref 12–46)
Lymphs Abs: 2.8 K/uL (ref 0.7–4.0)
Monocytes Absolute: 0.7 10*3/uL (ref 0.1–1.0)
Monocytes Absolute: 0.6 10*3/uL (ref 0.1–1.0)
Monocytes Relative: 7 % (ref 3–12)
Monocytes Relative: 7 % (ref 3–12)
Neutro Abs: 6 K/uL (ref 1.7–7.7)
Neutro Abs: 6.9 10*3/uL (ref 1.7–7.7)
Neutrophils Relative %: 62 % (ref 43–77)

## 2010-09-27 LAB — BASIC METABOLIC PANEL WITH GFR
Calcium: 9.7 mg/dL (ref 8.4–10.5)
Creatinine, Ser: 0.97 mg/dL (ref 0.4–1.2)
GFR calc Af Amer: >60 mL/min (ref >60)

## 2010-09-27 LAB — BASIC METABOLIC PANEL
BUN: 12 mg/dL (ref 6–23)
CO2: 27 mEq/L (ref 19–32)
Chloride: 103 mEq/L (ref 96–112)
GFR calc non Af Amer: >60 mL/min (ref >60)
Glucose, Bld: 84 mg/dL (ref 70–99)
Potassium: 3.3 mEq/L — ABNORMAL LOW (ref 3.5–5.1)
Sodium: 141 mEq/L (ref 135–145)

## 2010-09-27 LAB — POCT CARDIAC MARKERS
CKMB, poc: <1 ng/mL — ABNORMAL LOW (ref 1.0–8.0)
Myoglobin, poc: 61.8 ng/mL (ref 12–200)
Troponin i, poc: <0.05 ng/mL (ref 0.00–0.09)

## 2010-09-27 LAB — POCT PREGNANCY, URINE: Preg Test, Ur: NEGATIVE

## 2010-09-28 ENCOUNTER — Other Ambulatory Visit: Payer: Self-pay | Admitting: Internal Medicine

## 2010-10-12 ENCOUNTER — Telehealth: Payer: Self-pay | Admitting: *Deleted

## 2010-10-12 DIAGNOSIS — R002 Palpitations: Secondary | ICD-10-CM

## 2010-10-12 DIAGNOSIS — R61 Generalized hyperhidrosis: Secondary | ICD-10-CM

## 2010-10-12 NOTE — Telephone Encounter (Signed)
Pt is req lab order. She has been c/o hot flashes and palpitations x 7 days. Palpitations have been off and on - happen 2 x's a day, once in am and once in afternoon lasting approx 10 min. No CP or SOB, fever or any other complaints. Pt is worried about thyroid issues (has family hist of problems) and poss "early" menopause. She would like labs if possible. (No open apt avail)

## 2010-10-12 NOTE — Telephone Encounter (Signed)
OK FSH, TSH, CMET, CBC, UA( Dx sweats) and OV on Thur next wk - I will unblock my schedule on Thu and Fri next wk (May 3 and 4) Thx

## 2010-10-14 NOTE — Telephone Encounter (Signed)
Schedulers are attempting to contact pt for OV. Labs entered

## 2010-11-02 NOTE — Assessment & Plan Note (Signed)
[facility]                           PRIMARY CARE OFFICE NOTE   CINDIE, RAJAGOPALAN                      MRN:          [id number]  DATE:10/30/2006                            DOB:          12/08/1970    PROCEDURE:  Ear irrigation bilaterally.   INDICATION:  Severe impaction bilaterally.   The risks and benefits were explained to the patient in detail, she  agree to proceed. Both ears were irrigated with lukewarm water.  A large  amount of wax recovered.  Total time over 20 minutes.  Tolerated well,  complications none.     Georgina Quint. Plotnikov, MD  Electronically Signed    AVP/MedQ  DD: 10/31/2006  DT: 11/01/2006  Job #: [id number]

## 2010-11-05 NOTE — Assessment & Plan Note (Signed)
[facility]                                 ON-CALL NOTE   [name], [name]                        MRN:          [id number]  DATE:05/06/2008                            DOB:          February 10, 1971    The patient of Dr. Posey Rea.   Called from [phone] at 6:23 p.m. on May 06, 2008, complaining of a  severe sinus infection.  The patient states she had it since yesterday  but did not call Dr. Posey Rea today because she was at work and could  not.  However, she is miserable and feels like she cannot sleep without  something.  I did explain that we are going to call in antibiotics after  hours.  She is requesting Allegra.  I did call in Allegra 180 #30 with  no refill 1 p.o. daily, and told her she should call the office in the  morning for an appointment.     Lelon Perla, DO  Electronically Signed    [name]  DD: 05/06/2008  DT: 05/07/2008  Job #: [id number]   cc:   Georgina Quint. Plotnikov, MD

## 2010-11-05 NOTE — Discharge Summary (Signed)
NAME:  [name], [name]                         ACCOUNT NO.:  [id number]   MEDICAL RECORD NO.:  [id number]                   PATIENT TYPE:  OBV   LOCATION:  0375                                 FACILITY:  [facility]   PHYSICIAN:  Rene Paci, M.D. [facility]          DATE OF BIRTH:  1970-11-12   DATE OF ADMISSION:  08/14/2003  DATE OF DISCHARGE:                                 DISCHARGE SUMMARY   DISCHARGE DIAGNOSES:  1. Acute right lower extremity deep venous thrombosis in the setting of     previous trauma and immobility, initiating anticoagulation with Lovenox     plus Coumadin.  [facility] to provide Lovenox teaching at home     with outpatient follow-up PT with primary care physician as scheduled     below.  2. Subacute injury to right knee with reportedly torn anterior cruciate     ligament July 13, 2003.  Hospital follow-up as prior to admission with     orthopedist, Dr. Luiz Blare and her rehabilitation therapist, Viviann Spare, to be     discussed on discharge.  3. Iron deficiency anemia.  History of same.  Hemodynamically stable.     Discharge hemoglobin 8.3.  Continue iron supplementation.  4. Anxiety with depression.  5. Question bacterial vaginosis diagnosed prehospitalization.  Continue     antibiotics as prior to admission.   DISCHARGE MEDICATIONS:  1. Lovenox 100 mg subcu q.24h. to be administered by home health R.N. by     teaching for 5 days or until INR therapeutic.  2. Coumadin 7.5 mg p.o. daily or as directed by primary care physician or     Coumadin clinic.   Other medications are as prior to admission and include:  1. Valium 5 mg p.o. q.h.s.  2. Ritalin 4 mg q.a.m.  3. Flagyl 500 mg p.o. b.i.d., duration as prescribed by gynecologist.  4. Protonix 40 mg p.o. daily.  5. Paxil CR 12.5 mg p.o. daily.  6. Flexeril 5 mg t.i.d. as needed.  7. Vicodin one to two p.o. q.4h. p.r.n.  8. Chromagen one p.o. daily.  9. Xanax 0.5 mg p.o. q.6h. p.r.n. anxiety.   HOSPITAL  FOLLOW-UP:  With primary care physician, Dr. Posey Rea, for Monday,  August 18, 2003 at 4:30 p.m. to recheck INR and discuss if further absence  from work is needed.  Otherwise, may return to work for Tuesday, March 1 to  full-time duties without restriction.  Other hospital follow-up is with Dr.  Luiz Blare and rehab therapist to be arranged by the patient.   BRIEF HOSPITAL COURSE BY PROBLEM:  #1 - ACUTE RIGHT LOWER EXTREMITY DEEP  VENOUS THROMBOSIS.  The patient is an anxious 40 year old woman who has an  ongoing and difficult course of rehab following an ACL injury approximately  1 month ago to the right knee.  Approximately 2 days prior to presentation  she experienced increased swelling and pain in her right leg further  exacerbating her limited mobility of that leg.  She was seen by her rehab  therapist who tried icing and elevation of the leg and when this did not  resolve, referred her for ER evaluation.  Dopplers confirmed femoral,  popliteal, and calf DVT.  For coordination of anticoagulation she was  admitted for 24-hour observation to begin Lovenox and Coumadin and provide  instructions and outpatient teaching.  She was then continued on Vicodin for  pain as needed as prior to as well as given additional Xanax for her anxiety  in addition to her previous medications for anxiety and depression.  The  following morning she understands the plan and is felt stable for discharge.  Hospital follow-up with primary care physician as instructed.  The patient  is to remain out of work until cleared by primary care physician.  It is  anticipated that she is ready to return to work Tuesday, August 19, 2003 to  full-time duties.   #2 - IRON DEFICIENCY ANEMIA.  The patient has previous history of this  related to menometrorrhagia.  She has previously been on Chromagen but  admits she has not been taking this for several months.  Of note, she is not  taking any oral contraceptive pills nor is  she a smoker (in light of acute  thromboembolic disease).  She reports she was previously taking Chromagen  and has been encouraged to resume this, especially in the setting of  anticoagulation.  She is hemodynamically stable and no indication for  transfusion is performed at this time.  B12 and folate were normal.  Ferritin was extremely low-normal at a level of 15.  Iron studies are not  done.   #3 - ANXIETY/DEPRESSION.  This was discussed as above.  Continue home  medications plus p.r.n. Xanax.                                               Rene Paci, M.D. [facility]    VL/MEDQ  D:  08/15/2003  T:  08/15/2003  Job:  [id number]

## 2010-11-05 NOTE — Discharge Summary (Signed)
[name], [name] NO.:  [id number]   MEDICAL RECORD NO.:  [id number]          PATIENT TYPE:  INP   LOCATION:  2002                         FACILITY:  [facility]   PHYSICIAN:  Ines Bloomer, M.D. DATE OF BIRTH:  08-20-1970   DATE OF ADMISSION:  06/24/2004  DATE OF DISCHARGE:  06/29/2004                                 DISCHARGE SUMMARY   HISTORY OF PRESENT ILLNESS:  The patient is a 40 year old female with a  complex prior history.  She had a massive hemoptysis requiring a right lower  lobectomy in 1991 at age 73 performed by Dr. Olga Millers.  At that time,  pathology revealed multiple cysts in the right lower lobe which were  inflammatory and had hemosiderin but no real positive reason for hemoptysis.  She has done well until recently when she was seen in the emergency room at  which time she had left lower lobe pain and dyspnea and a recurrence of  hemoptysis.  She has been seen by Dr. 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  She had been on Coumadin but has not been on it recently with no recurrence  of pulmonary emboli.  A CT scan was obtained which showed some cavitary  densities of the left lower lobe with some air space disease.  She was  treated with antibiotics.  She had no epistaxis but some intermittent  hemoptysis.  She was seen by Dr. Marcelyn Bruins who recommended resection of  the cysts and was referred to Dr. Edwyna Shell for the procedure.   PAST MEDICAL HISTORY:  Multiple sinus problems.  Also the right lower  lobectomy as described above and repair of the right anterior cruciate  ligament in April of 2005.   MEDICATIONS ON ADMISSION:  1.  Protonix 40 mg daily.  2.  Chromagen 70 mg daily.   ALLERGIES:  Include codeine, sulfa, penicillin, guaifenesin, tetracycline,  NyQuil and morphine.   FAMILY HISTORY:   SOCIAL HISTORY:   REVIEW OF SYSTEMS:   PHYSICAL EXAMINATION:  Please see  the history and physical done at the time  of admission.   HOSPITAL COURSE:  The patient was admitted electively and on June 24, 2004, she was taken to the operating room where she underwent a left mini  thoracotomy with wedge resection of the left lower lobe cyst.  This was  performed by Dr. Edwyna Shell.  The patient tolerated the procedure well and was  taken to the post anesthesia care unit in stable condition.   POSTOPERATIVE HOSPITAL COURSE:  The patient has done well.  She has had some  moderate difficulty with nausea but this is improved.  She does have a  postoperative anemia with most recent hematocrit of 24%.  She has been  started on iron supplementation.  The pathology is currently still pending  as at the time of this dictation.  The specimen has been sent to Dr.  Craige Cotta.  The incision is healing well without signs of infection.  She  has been weaned from oxygen, maintains good  saturations on room air.  She  has tolerated a gradual increase in activity commensurate for level of  postoperative convalescence.  She is afebrile with stable hemodynamics.  She  is overall felt to be quite stable at this time for discharge on June 29, 2004.   MEDICATIONS ON DISCHARGE:  1.  She is to continue her Protonix 40 mg daily.  2.  She is to use Niferex 150 mg daily with food.  3.  Stool softener as needed for pain.  4.  Ultram 50 mg one or two every six hours as needed.   INSTRUCTIONS:  The patient received written instructions in regard to  medications, activity, diet, wound care and follow up.   FOLLOW UP:  Will include Dr. Edwyna Shell on Tuesday, July 06, 2004 at 2 p.m.  with a chest x-ray from [facility].   FINAL DIAGNOSIS:  Recurrent hemoptysis, pathology pending.  Status post  removal of left lower lobe cyst this hospitalization.   OTHER DIAGNOSES:  As previously dictated per the history.       WEG/MEDQ  D:  06/29/2004  T:  06/29/2004  Job:   [id number]   cc:   [facility]   Marcelyn Bruins, M.D. [facility]

## 2010-11-05 NOTE — Op Note (Signed)
NAMEENVY, MENO               ACCOUNT NO.:  [id number]   MEDICAL RECORD NO.:  [id number]          PATIENT TYPE:  INP   LOCATION:  2899                         FACILITY:  [facility]   PHYSICIAN:  Ines Bloomer, M.D. DATE OF BIRTH:  [birth year]   DATE OF PROCEDURE:  DATE OF DISCHARGE:                                 OPERATIVE REPORT   PREOPERATIVE DIAGNOSES:  Hemoptysis secondary to left lower lobe cyst,  previous right lower lobectomy for hemorrhagic cyst.   POSTOPERATIVE DIAGNOSES:  Hemoptysis secondary to left lower lobe cyst,  previous right lower lobectomy for hemorrhagic cyst.   PROCEDURE:  Left mini-thoracotomy, wedge resection of left lower lobe cyst.   SURGEON:  Ines Bloomer, M.D.   FIRST ASSISTANT:  Claudette TNils Flack, N.P.   ANESTHESIA:  General.   DESCRIPTION OF PROCEDURE:  After percutaneous insertion of all monitoring  lines, the patient underwent general anesthesia, turned to the left lateral  thoracotomy position, prepped and draped in the usual sterile manner.  The  patient had undergone needle localization at these cysts which were in the  posterior basilar segment of the left lower lobe.  A 5-8 cm incision was  made over the 6th intercostal space with knife, the subcutaneous tissue was  divided with cautery, the 6th intercostal space was entered and 2 TEA's were  placed at right angles.  The needle was secured to the lung, and I could  palpate where it was, and the cysts that were around the lung.  This was  excised with a TLC-75 stapler and an EC-45 stapler, excising the cyst.   After this had been done, cyst was sent to pathology, Crosse[place] was applied  on the staple line.  One chest tube was brought in through a separate stab  wound.  The chest was closed with 2 pericostals. The On-Q catheters were  placed in the usual fashion in the perivertebral space and sutured in place  with 3-0 silk.  The muscle layer was closed with  #1 Vicryl and 2-0 Vicryl in  the subcutaneous tissue and 3-0 Vicryl as a  subcuticular stitch.  Dry sterile dressing was applied.  The patient  returned to the recovery room in stable condition.       DPB/MEDQ  D:  06/24/2004  T:  06/24/2004  Job:  [id number]   cc:   Marcelyn Bruins, M.D. [facility]

## 2010-11-05 NOTE — H&P (Signed)
[name], [name]               ACCOUNT NO.:  [id number]   MEDICAL RECORD NO.:  [id number]          PATIENT TYPE:  INP   LOCATION:  1331                         FACILITY:  [facility]   PHYSICIAN:  Rosalyn Gess. Norins, MD  DATE OF BIRTH:  12-17-1970   DATE OF ADMISSION:  08/03/2006  DATE OF DISCHARGE:                              HISTORY & PHYSICAL   ADMISSION DIAGNOSIS:  Pneumonia.   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  The patient has a  history of episodes of pneumonia in the past, that lead to syncope as  well as the need for hospital admission and management.   The patient reports on 08/02/2006 in the p.m. she felt lightheaded after  work.  On the morning of admission she awakened with tightness in her  chest and developed a cough with blood-tinged mucus.  She developed  increasing shortness of breath and generalized malaise.  She reports  this is exactly how she feels that she had pneumonia in the past which  if not treated early, becomes a major medical problem.  The patient, at  this point, then is admitted to the [facility] for IV  antibiotics, chest x-ray, lab work, and IV for fluid support.   PAST MEDICAL/SURGICAL HISTORY:  1. lung surgery in 1991 by Dr. Olga Millers for decortication and possible      lower lobectomy.  2. Repair of anterior cruciate ligament in April 2005.  3. Left lower lobectomy for cyst.   MEDICAL ILLNESSES:  1. History of recurrent pulmonary embolus.  2. History of sinus difficulties.  3. History of multiple episodes of pneumonia.  4. History of migraine headache.  5. History of severe GERD with known Barrett's esophagus.   CURRENT MEDICATIONS:  1. Nuiron 150 mg daily.  2. Zegerid 40 mg t.i.d..  3. Multivitamins.  4. Potassium 10 mEq daily.   FAMILY HISTORY:  Remarkable for her  father having had emphysema.  History of her mother having had heart disease.   SOCIAL HISTORY:  Patient is single, works full-time lives with her  sister; is independent in her activities of daily living.   DRUG ALLERGIES:  SULFA, PENICILLIN, TETRACYCLINE, CODEINE, NYQUIL,  MORPHINE and PHENERGAN.  She is also allergic to VICODIN as a CODEINE  PRODUCT   REVIEW OF SYSTEMS:  The patient has had no weight changes.  She is not  having a current headache.  She has had some fever home.  No ophthalmic  complaints, ENT complaints, cardiovascular complaints.  Respiratory per  the HPI.  No GI complaints.  GU: Is negative; and the patient reports  she is up-to-date with pelvic and Pap smears, and is, in fact, to be  worked up with ultrasound for possible fibroid tumors..  She has had a  breast exam..  No musculoskeletal or dermatologic complaints.   PHYSICAL EXAMINATION:  VITAL SIGNS:  In the office, just at the time of  admission temperature 97, blood pressure 131/84, pulse 98.  Weight 131.  GENERAL APPEARANCE:  This is a pale, red-headed, young woman who looks  to be in no acute distress.  HEENT EXAM:  Normocephalic, atraumatic.  EACs with cerumen impacted on  the left.  Oropharynx with native dentition.  No buccal lesions were  noted.  Posterior pharynx was clear.  Conjunctivae and sclerae were  clear.  Pupils equal and round and react to light and accommodation.  NECK:  Supple.  There is no thyromegaly.  Nodes no adenopathy was noted  in the submandibular, cervical, or supraclavicular regions.  CHEST:  She has moist wet rhonchi sounds at both bases.  She had wet  rhonchi sounds in the left upper lobe.  No wheezing was noted.  She had  no increased work of breathing.  She is not using accessory muscles of  respiration.  CARDIOVASCULAR:  2+ radial pulses with JVD, no carotid bruits.  She had  a quiet precordium with a regular rate and rhythm without murmurs, rubs,  or gallops.  BREASTS  EXAM:  Deferred to gynecology.  ABDOMEN:  Soft, no guarding or rebound.  No hepatosplenomegaly was  noted.  PELVIC AND RECTAL EXAMS:  Deferred to gynecology.  EXTREMITIES:  The patient has mild clubbing of her fingers.  No other  deformity or abnormalities are noted.  NEUROLOGIC:  Exam is nonfocal.   ASSESSMENT/PLAN:  1. Pulmonary: Patient with history of lung surgery x2 with chronic      lung problems.  Now presenting with symptoms that are similar to      episodes of pneumonia in the past.  Although she is not as sick as      she has been, we want to get on top of this early to prevent severe      illness.  Plan:  The patient is to be admitted to [facility] will be referred to the ER to get started on therapy.      Will get a PA and lateral chest x-ray, CBC with diff,      multichemistry panel.  The patient will be started on O2 therapy. 2      liters of oxygen per nasal cannula.  She will be started on Avelox      400 mg IV q.24 h. (First dose STAT) azithromycin 500 mg p.o. daily      x3 (first dose STAT).  The patient can have Tylenol for fevers and      discomfort.  She will have a regular diet.  2. GI: Patient with a history of chronic reflux esophagitis with known      Barrett's esophagus.  Will continue her home medications including      Zegerid 40 mg b.i.d..  3. Chronic anemia.  The patient reports that her last hemoglobin was      11.6 up from 6.something in December when she was admitted to      hospital.  She will continue on Nuiron.   SUMMARY:  This is a very pleasant woman whom, I believe, has early  pneumonia with significant compromised pulmonary situation.     Rosalyn Gess Norins, MD  Electronically Signed    MEN/MEDQ  D:  08/03/2006  T:  08/03/2006  Job:  [id number]   cc:   Medicine, [facility] Dr. Betsy Coder, Department of Pulmonary

## 2010-11-05 NOTE — H&P (Signed)
[name], [name]               ACCOUNT NO.:  [id number]   MEDICAL RECORD NO.:  [id number]          PATIENT TYPE:  INP   LOCATION:                               FACILITY:  [facility]   PHYSICIAN:  Ines Bloomer, M.D. DATE OF BIRTH:  June 22, 1970   DATE OF ADMISSION:  06/24/2004  DATE OF DISCHARGE:                                HISTORY & PHYSICAL   CHIEF COMPLAINT:  Coughing up blood.   HISTORY OF PRESENT ILLNESS:  This 40 year old patient has had a long  complicated history.  She had massive hemoptysis requiring a right lower  lobectomy at age 3 in 43by Dr. Olga Millers.  At the time, pathology showed  multiple cysts in the right lower lobe which were inflammatory and had  hemosiderin but no real reason for hemoptysis.  She did well since then  until recently when she was seen in the emergency room with left lower lobe  pain and dyspnea, and had a recurrence of her hemoptysis.  In the interim,  she has been seen by Dr. 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  She had  been on Coumadin for this but recently that has been stopped with no  recurrence of her pulmonary emboli.  CT scan was obtained which showed some  cavitary densities in the left lower lobe with some air space disease.  She  was treated with antibiotics.  She has had no epistaxis but still has some  intermittent hemoptysis.  She was seen by Dr. Marcelyn Bruins who recommended  that she had possible resection of the cysts with preservation of as much of  the lung tissue as possible.   FAMILY HISTORY:  Negative for hemoptysis, is negative for cancer, and is  negative for cardiovascular disease.  But, her father did have emphysema,  and she has a sister who apparently has cardiac disease.   PAST MEDICAL HISTORY:  She has also had multiple sinus problems that have  been treated and has a resection per Dr. Nedra Hai.  She also had a right anterior  cruciate  repair in April 2005.   MEDICATIONS:  1.  Protonix 40 mg a day.  2.  Chromogen 70 mg a day.   ALLERGIES:  CODEINE, SULFA, PENICILLIN, GUAIFENESIN, TETRACYCLINE, NYQUIL,  and MORPHINE.   SOCIAL HISTORY:  She has never smoked.  She works for [employer] as a  Designer, multimedia, single.   REVIEW OF SYSTEMS:  GENERAL:  Her weight is 140 pounds.  She is 5 foot, 7  inches.  She has had no change in her weight.  PULMONARY:  She gets some  mild shortness of breath with exertion.  She has been treated for a  productive cough and bronchitis.  CARDIAC:  No history of angina or  palpitations.  GI:  She had a bleeding ulcer in 1991 and a hiatal hernia  with reflux.  EXTREMITIES:  She has had a right ACL repair and has had two  episodes of DVT.  No episode of claudication or nonhealing ulcers.  GU:  She  has no history of dysuria or frequent urinations.  NEUROLOGIC:  She has no  history of seizures or headaches but has had history of fainting spells and  dizziness.  SKIN;  She has tinea versicolor and nail infections.  PSYCHIATRIC:  She has been treated for nervousness as well as situational  depression.  ENT:  She has had chronic sinusitis as mentioned with no change  in her hearing, no change in her eyesight.  HEMATOLOGIC:  She has had some  anemia, treated for anemia, and has had problem with a hypocoagulable state.   PHYSICAL EXAMINATION:  VITAL SIGNS:  Her blood pressure is 120/70, pulse 88,  respirations 18, saturations 98%.  Her pulmonary function tests showed an  FVC of 3.44 with an FEV1 of 2.74, which is 86% of predicted.  HEENT:  Head is atraumatic.  Pupils are equal and reactive to light and  accommodation.  Ears:  Tympanic membranes are intact.  Nose:  There is no  septal deviation, some mild erythema of the nasal mucosa.  Mouth:  No oral  lesions.  Uvula is in the midline.  Tongue is in the midline.  NECK: There are no carotid bruits or thyromegaly, no supraclavicular or  axillary  adenopathy.  CHEST:  Clear to auscultation and percussion.  There is a well-healed right  thoracotomy scar.  ABDOMEN:  Soft.  There is no hepatosplenomegaly.  Bowel sounds are normal.  EXTREMITIES:  Pulses are 2+.  There is no clubbing or edema.  There are  right knee surgical scars, no Homans sign and no calf tenderness.  NEUROLOGIC:  She is oriented x 3.  Cranial nerves II-XII are intact.  Sensory and motor are grossly intact.  Deep tendon reflexes are 2+.   IMPRESSION:  Recurrent hemoptysis, left lower lobe cystic disease, history  of deep venous thrombosis and possible hypercoagulable state.  History of  sinusitis, hiatal hernia, history of peptic ulcer disease.   PLAN:  Left video-assisted lung and chest surgery (VATS), resection of left  lower lobe cystic lesions for pathologic examination and, hopefully, to  resolve hemoptysis.        ___________________________________________  Ines Bloomer, M.D.    DPB/MEDQ  D:  06/22/2004  T:  06/22/2004  Job:  [id number]   cc:   Ines Bloomer, M.D.  [address]  [place]  [place] [address]

## 2010-11-05 NOTE — H&P (Signed)
NAMESTEVANA, [name]               ACCOUNT NO.:  [id number]   MEDICAL RECORD NO.:  [id number]           PATIENT TYPE:   LOCATION:                               FACILITY:  [facility]   PHYSICIAN:  Bruce Rexene Edison. Swords, MD         DATE OF BIRTH:   DATE OF ADMISSION:  05/31/2006  DATE OF DISCHARGE:                              HISTORY & PHYSICAL   CHIEF COMPLAINT:  Headache, nausea, vomiting, and dizziness.   HISTORY OF PRESENT ILLNESS:  [name] is a 40 year old female,  generally healthy, who experienced an episode of diarrhea last night.  This morning she woke up with a headache.  When she went to work the  headache worsened.  She then felt dizzy, warm, and faint all over.  She  came home, had three episodes of emesis.  Again she felt warm and faint.  No loss of consciousness.  She did have some stomach cramping.  She  denies any shortness of breath, PND, orthopnea.   PAST MEDICAL HISTORY:  1. Significant for migraine headaches.  2. Previous lung surgery for congenital cysts.  3. History of Barrett's esophagus.   MEDICATIONS:  1. __________ t.i.d.  2. Imitrex p.r.n.  3. Stool softener.   SOCIAL HISTORY:  She works at a Clinical biochemist area.  She does not  drink alcohol or smoke.  She lives with her sister.   ALLERGIES:  Multiple, including:  1. SULFA.  2. PENICILLIN.  3. TETRACYCLINE.  4. CODEINE.  5. NYQUIL.  6. MORPHINE.   REVIEW OF SYSTEMS:  She denies any chest pain, shortness of breath, PND,  orthopnea, or any other complaints in a complete review of systems other  than those listed above.   PHYSICAL EXAMINATION:  VITAL SIGNS:  Temperature 99.7, heart rate 92,  blood pressure 125/81, respirations 16.  GENERAL:  In general she appears as a well-developed, well-nourished  female in no acute distress.  HEENT:  Atraumatic, normocephalic.  Extraocular muscles are intact.  Conjunctivae are pale.  Oropharynx is pale.  SKIN:  Pale.  NECK:  Supple without lymphadenopathy,  thyromegaly, jugular venous  distention, or carotid bruits.  CHEST:  Clear to auscultation without any increased work of breathing.  CARDIAC:  S1 and S2 are regular without murmurs or gallops.  ABDOMEN:  Active bowel sounds, soft, nontender.  There is no  hepatosplenomegaly.  No masses are palpated.  EXTREMITIES:  There is no clubbing, cyanosis, or edema.  NEUROLOGIC:  She is alert and oriented without any motor or sensory  deficits.  RECTAL:  Performed by ER staff.  Heme-negative stool.   LABORATORY DATA:  Hemoglobin 7.5, MCV 61.8, platelet count 381,000.  Urine pregnancy test is negative.  Lipase 13.  Potassium is 3.1, albumin  3.3   Chest x-ray unremarkable.   ASSESSMENT/PLAN:  1. Acute gastroenteritis, most likely cause of her multitude of GI      symptoms.  Will treat with IV fluids and antiemetics p.r.n.  2. Profound anemia.  I suspect this is secondary to menorrhagia.  She      does admit  to 7-10 days of bleeding, described as heavy bleeding      during her period.  Although she is dizzy, I think that is related      to her acute gastroenteritis.  I am hopeful that we can avoid      transfusion, given her otherwise healthy state and youth.  Will      treat with IV iron.  3. Hypokalemia.  Will replace.   BMET in the morning, CBC in the morning.   Will obtain iron studies and peripheral blood smear prior to IV iron.      Bruce Rexene Edison Swords, MD  Electronically Signed     BHS/MEDQ  D:  05/31/2006  T:  05/31/2006  Job:  [id number]   cc:   Carla Quint. Plotnikov, MD  [address]  [place]  [place] [address]

## 2010-11-05 NOTE — Consult Note (Signed)
NAME:  [name], [name]                         ACCOUNT NO.:  [id number]   MEDICAL RECORD NO.:  [id number]                   PATIENT TYPE:  INP   LOCATION:  0104                                 FACILITY:  [facility]   PHYSICIAN:  Rosalyn Gess. Norins, M.D. [facility]         DATE OF BIRTH:  [birth year]   DATE OF CONSULTATION:  08/18/2003  DATE OF DISCHARGE:                                   CONSULTATION   CHIEF COMPLAINT:  Positive CT scan for PE.   HISTORY OF PRESENT ILLNESS:  [name] is a very pleasant 40 year old who  had a torn right ACL, and has been under orthopedic care.  She developed a  swollen calf in her right leg, and, on August 13, 2003, was diagnosed with  a right DVT.  She was admitted to the hospital, started on Lovenox, and  Coumadin.  She was discharged on Friday, August 15, 2003, with an INR of  1.1, and to continue on Lovenox treatments.  She did not have a CT scan  during that hospital stay.   Since discharge, the patient has continued on Coumadin at 7.5 mg daily, but  has not had a follow up pro time.  She also has been getting daily Lovenox  injections in the emergency department.  She presented to the office to see  Dr. Posey Rea today for follow up and cough.  Dr. Posey Rea sent the patient  for CT scan of the chest.  I was not aware of the patient's condition, but  was called by radiology to report that they had a patient who had a positive  CT scan for pulmonary embolus.  The patient was subsequently referred to the  emergency department where I evaluated her for potential admission.   SURGICAL HISTORY:  The patient had a right lower lobe pneumonectomy for  congenital lung defect in 1991.   MEDICAL HISTORY:  1. The patient had chicken pox.  2. Congenital lung defect.  3. Peptic ulcer disease with hemorrhage in 1993.  4. GERD.  5. History of depression with hospitalization.  6. History of torn ACL.  7. She has recently been diagnosed with bacterial vaginosis  during her last     hospital stay.  8. The patient is a gravida 0, para 0.  9. She has had no trauma or injuries.   CURRENT MEDICATIONS:  1. Flagyl 500 mg b.i.d. for bacterial vaginosis.  2. Paxil CR 12.5 mg daily.  3. Protonix 40 mg daily.  4. Valium 5 mg q.6h. p.r.n.  5. Xanax 0.5 mg q.6h. p.r.n.   HABITS:  Tobacco none.  Alcohol none.   DRUG ALLERGIES:  1. CODEINE causes nausea.  2. TETRACYCLINE causes rash.  3. PENICILLIN causes rash.  4. SULFA causes rash.  5. MORPHINE SULFATE causes irritability.  6. GUAIFENESIN - the patient had an unintentional overdose and had a     reaction.   FAMILY HISTORY:  Negative for breast  cancer, colon cancer.  Positive for  coronary artery disease, diabetes, and hypertension.   SOCIAL HISTORY:  The patient is a high Garment/textile technologist.  She works for  [employer] as a Designer, multimedia.  She is single.  She is not sexually  active.  She lives with her sister and brother-in-law.   PHYSICAL EXAMINATION:  VITAL SIGNS:  Temperature 98, blood pressure 131/92,  respirations 16.  GENERAL APPEARANCE:  Well-nourished, well-developed woman in no acute  distress.  HEENT:  Normocephalic and atraumatic.  EAC's and TM's were unremarkable.  Oropharynx was clear.  NECK:  Supple.  CHEST:  Clear with no rales, wheezes, or rhonchi.  She has an old surgical  scar at the right axilla.  BREAST EXAM:  Deferred.  CARDIOVASCULAR:  There were 2+ radial pulses.  No JVD or carotid bruits.  She had a regular rate and rhythm without murmurs, rubs, or gallops.  ABDOMEN:  Soft.  No guarding or rebound.  No organosplenomegaly was noted.   LABORATORY DATA:  Hemoglobin was 10.1 gm, hematocrit 31.9%, white count  10,200, with a normal differential.  Platelet count was 542,000.  Chemistries revealed a sodium of 134, potassium 3.4, chloride 105, CO2 23,  glucose 89, BUN 8, creatinine 0.8.  Liver functions were mildly elevated  with an SGOT of 69, SGPT of 43, alkaline  phosphatase normal, total bilirubin  normal.  INR was 4.8.  PTT was 51 seconds, which is elevated, and  corresponds with the patient being on Lovenox.   CT scan by verbal report reveals the patient to have a PE.   ASSESSMENT:  Pulmonary embolus.  The patient did have a deep vein thrombosis  a week ago.  At this point, she is adequately anticoagulated on Coumadin,  and actually over-anticoagulated.  Because she is therapeutic on Coumadin,  there is no need to admit this patient.   PLAN:  The patient is to be discharged home from the emergency department.  She is to continue on all of her home medications.  She is instructed to  reduce her Coumadin dose to one tablet (5 mg) daily.  The patient will be  referred to the Coagulation Clinic at [facility], and will endeavor  to have her seen on this Thursday for follow up INR and routine monitoring.   The patient was given a full explanation of the mechanism of Coumadin and  the risks that she has for increased bleeding, although it will not cause  bleeding.   CONDITION AT THE TIME OF DISCHARGE:  Stable.                                               Rosalyn Gess Norins, M.D. [facility]    MEN/MEDQ  D:  08/18/2003  T:  08/18/2003  Job:  [id number]   cc:   Georgina Quint. Plotnikov, M.D. [facility]   Coagulation Clinic, [facility]  [address]

## 2010-11-05 NOTE — Discharge Summary (Signed)
[name], [name] NO.:  [id number]   MEDICAL RECORD NO.:  [id number]          PATIENT TYPE:  INP   LOCATION:  1517                         FACILITY[facility]  [facility]   PHYSICIAN:  Gordy Savers, MDDATE OF BIRTH:  [birth year]   DATE OF ADMISSION:  05/31/2006  DATE OF DISCHARGE:  06/02/2006                               DISCHARGE SUMMARY   FINAL DIAGNOSES:  1. Anemia secondary to menorrhagia.  2. Acute viral gastroenteritis   DISCHARGE MEDICATIONS:  1. Protonix 40 mg b.i.d.  2. Imitrex p.r.n.  3. Colace 100 mg daily.  4. Nu-Iron 1 tablet b.i.d.   HISTORY OF PRESENT ILLNESS:  A 40 year old white female who had the  onset of diarrhea associated with headache who subsequently developed  extreme weakness, intractable emesis.  She presented to the office where  she was noted to be quite anemic with a hemoglobin of 7.5.  She is now  admitted for further evaluation and treatment of her acute  gastroenteritis and anemia.   LABORATORY DATA AND HOSPITAL COURSE:  The patient was admitted to the  hospital where she was rehydrated with IV fluids.  She was initially  placed on a clear liquid diet and this was gradually advanced in the  hospital.  She received 2 units of packed RBCs. She also had an attempt  to receive IV Dextran but was associated with a vagal vagal reaction and  discontinued. White count revealed normal count of 6.7.  Admission  hemoglobin 6.9, hematocrit 22.6, MCV was 61.6.  Stool for occult blood  was negative.  Chemistries were unremarkable. A normal BUN and  creatinine, potassium 3.5. The patient did tolerate the 2 units of  packed RBCs without difficulty. Urinalysis negative. Ferritin was low at  5 with a 3% saturation.   DISPOSITION:  The patient will be discharged today on the medical  regimen listed above.  She has been asked to return to the care of her  primary care physician, Dr. Posey Rea, next week.  A repeat CBC will be  checked at that  time.      Gordy Savers, MD  Electronically Signed     PFK/MEDQ  D:  06/02/2006  T:  06/02/2006  Job:  [id number]

## 2010-11-05 NOTE — Op Note (Signed)
NAMESHEREENA, [name]               ACCOUNT NO.:  [id number]   MEDICAL RECORD NO.:  [id number]          PATIENT TYPE:  AMB   LOCATION:  CARD                         FACILITY:  [facility]   PHYSICIAN:  Marcelyn Bruins, M.D. [facility] DATE OF BIRTH:  1971-04-19   DATE OF PROCEDURE:  05/20/2004  DATE OF DISCHARGE:                                 OPERATIVE REPORT   PROCEDURE:  Flexible fiberoptic bronchoscopy with bronchioalveolar lavage  and bronchial brushings.   INDICATIONS:  Hemoptysis with cavitary densities in the left lower lobe of  unknown etiology.   OPERATOR:  Marcelyn Bruins, M.D.   ANESTHESIA:  Demerol 100 mg IV, Versed 15 mg IV, topical 1% lidocaine in the  vocal cords and airway during the procedure.   DESCRIPTION:  After obtaining informed consent and under close  cardiopulmonary monitoring, the above preoperative anesthesia was given, and  the fiberoptic scope was passed through an oral bite lock because of  narrowed nares.  The scope was passed into the posterior pharynx, where  there was no obvious lesion or abnormalities seen.  The vocal cords appeared  to be within normal limits and moved appropriately.  The scope was then  passed into the trachea, which was examined along its entire length down to  the level of the carina, all of which was normal.  The right  tracheobronchial tree was examined serially to the subsegmental level with  no abnormality being found except for a staple line in the area of where the  right lower lobe bronchus usually resides.  Attention was then paid to the  left tracheobronchial tree, where there were no significant abnormalities  but some mild erythema in the lower lobe mucosa.  Bronchioalveolar lavage  was then done from two basilar segments of the left lower lobe and sent for  the appropriate studies.  Bronchial brushings were also done from these same  areas and sent for AFB and fungal smears as well as cytology.  Overall, the  patient tolerated the  procedure well, and there were no complications.      KC/MEDQ  D:  05/20/2004  T:  05/20/2004  Job:  [id number]   cc:   Rose Phi. Myna Hidalgo, M.D.  [address] [facility]  [place], [place] [address]  Fax: [phone]

## 2010-11-05 NOTE — H&P (Signed)
NAMENORVELLA, LOSCALZO               ACCOUNT NO.:  [id number]   MEDICAL RECORD NO.:  [id number]          PATIENT TYPE:  INP   LOCATION:  0104                         FACILITY:  [facility]   PHYSICIAN:  Valetta Mole. Swords, MD    DATE OF BIRTH:  07/15/1970   DATE OF ADMISSION:  05/31/2006  DATE OF DISCHARGE:                              HISTORY & PHYSICAL   ADDENDUM:  Ms. Koelzer is admitted for anemia.  It is worth noting that  she has been told to take iron three times daily.  She discontinued all  iron because she thought it was too constipating and that it really was  not necessary.  This, likely, explains her significant iron deficiency  anemia for menorrhagia.      Bruce Rexene Edison Swords, MD  Electronically Signed     BHS/MEDQ  D:  05/31/2006  T:  05/31/2006  Job:  [id number]

## 2010-11-05 NOTE — Discharge Summary (Signed)
[name], [name]               ACCOUNT NO.:  [id number]   MEDICAL RECORD NO.:  [id number]          PATIENT TYPE:  INP   LOCATION:  1331                         FACILITY:  [facility]   PHYSICIAN:  Rosalyn Gess. Norins, MD  DATE OF BIRTH:  12/05/1970   DATE OF ADMISSION:  08/03/2006  DATE OF DISCHARGE:  08/04/2006                               DISCHARGE SUMMARY   ADMITTING DIAGNOSIS:  Rule out pneumonia.   DISCHARGE DIAGNOSIS:  Bronchitis.   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  She  has a history of frequent pneumonias and bronchitis.  She was initially  treated for bronchitis, approximately a month ago, but was unable to  complete her antibiotics for cost reasons.  The patient reports on 02/13  she developed lightheadedness; and on 02/14, the day of admission, had  chest tightness with mucus with blood tinged aspect to it.  She also had  increased shortness of breath.  She presented to the office, and the  concern was of possible pneumonia in a compromised patient.  She was  subsequent admitted for IV antibiotics and evaluation.   Please see the H&P for past medical history, family history, social  history, and admission exam.   HOSPITAL COURSE:  The patient was admitted to the ER and then to the  floor.  She was given IV Avelox 400 mg and p.o. azithromycin 500 mg.  Initial laboratory revealed a white count of 9100 with normal  hemoglobin.  Her chemistries were unremarkable with sodium of 134.  Chest x-ray revealed the patient to have no infiltrates no effusions,  and was a normal chest x-ray.    The patient on the morning of discharge was feeling much better.  She  reports her shortness of breath with minimal.  She had not had any  hemoptysis.   PHYSICAL EXAMINATION:  Temperature was 98.5; blood pressure 123/74;  heart rate was 84; respirations 16; O2 saturation was 99% on  room air.  General appearance is that of a thin woman in no acute distress.  Chest:  Patient is moving air well.  I did not appreciate any rales or wheezes.  There is no increased work of breathing.   ASSESSMENT AND PLAN:  Pulmonary: Patient probably with a bronchitis  rather than pneumonia.  Currently she is doing much better.  Chest x-ray  was reassuring with no signs of infiltrate.  White count was normal.  With the patient being stable, at this point, I feel she is ready for  discharge home.  Will convert her to a 24-hour observation..  The patient will be going home on Avelox 400 mg p.o. daily for five  additional days..  She can use a cough syrup of choice.  She should  follow up with her primary care physician in 7-10 days or sooner on an  as-needed basis if she has recurrent symptoms.  The patient's condition  at the time of discharge dictation is stable and improved.      Casimiro Needle  Esther Hardy, MD  Electronically Signed     MEN/MEDQ  D:  08/04/2006  T:  08/04/2006  Job:  [id number]   cc:   Georgina Quint. Plotnikov, MD  [address]  [place]  [place] [address]

## 2011-03-23 ENCOUNTER — Telehealth: Payer: Self-pay | Admitting: *Deleted

## 2011-03-23 ENCOUNTER — Encounter: Payer: Self-pay | Admitting: Internal Medicine

## 2011-03-23 NOTE — Telephone Encounter (Signed)
Pt's sister calling req abx for pt. I advised ner pt needs OV/transferred to scheduler.

## 2011-03-24 ENCOUNTER — Emergency Department (HOSPITAL_COMMUNITY): Payer: Self-pay

## 2011-03-24 ENCOUNTER — Emergency Department (HOSPITAL_COMMUNITY)
Admission: EM | Admit: 2011-03-24 | Discharge: 2011-03-24 | Disposition: A | Discharge status: Home / Self Care | Payer: Self-pay | Attending: Emergency Medicine | Admitting: Emergency Medicine

## 2011-03-24 DIAGNOSIS — R079 Chest pain, unspecified: Secondary | ICD-10-CM | POA: Insufficient documentation

## 2011-03-24 DIAGNOSIS — R0602 Shortness of breath: Secondary | ICD-10-CM | POA: Insufficient documentation

## 2011-03-24 DIAGNOSIS — E876 Hypokalemia: Secondary | ICD-10-CM | POA: Insufficient documentation

## 2011-03-24 DIAGNOSIS — J40 Bronchitis, not specified as acute or chronic: Secondary | ICD-10-CM | POA: Insufficient documentation

## 2011-03-24 DIAGNOSIS — I10 Essential (primary) hypertension: Secondary | ICD-10-CM | POA: Insufficient documentation

## 2011-03-24 DIAGNOSIS — R042 Hemoptysis: Secondary | ICD-10-CM | POA: Insufficient documentation

## 2011-03-24 LAB — CBC
MCH: 28.1 pg (ref 26.0–34.0)
Platelets: 313 10*3/uL (ref 150–400)
RBC: 4.8 MIL/uL (ref 3.87–5.11)

## 2011-03-24 LAB — POCT I-STAT, CHEM 8
Creatinine, Ser: 1 mg/dL (ref 0.50–1.10)
Hemoglobin: 14.3 g/dL (ref 12.0–15.0)
Sodium: 141 mEq/L (ref 135–145)
TCO2: 25 mmol/L (ref 0–100)

## 2011-03-25 ENCOUNTER — Ambulatory Visit: Payer: Self-pay | Admitting: Internal Medicine

## 2011-03-29 ENCOUNTER — Telehealth: Payer: Self-pay | Admitting: *Deleted

## 2011-03-29 NOTE — Telephone Encounter (Signed)
Rf req for Diazepam 2 mg 1 po tid prn # 90. Last filled 08-08-10. Ok to Rf?

## 2011-03-29 NOTE — Telephone Encounter (Signed)
OK to fill this prescription with additional refills x2 Thank you!  

## 2011-03-30 MED ORDER — DIAZEPAM 2 MG PO TABS: 2.0000 mg | ORAL_TABLET | Freq: Three times a day (TID) | ORAL | Status: DC | PRN

## 2011-04-04 LAB — CBC
MCV: 77.2 — ABNORMAL LOW
RBC: 4.79
WBC: 8.1

## 2011-04-04 LAB — BASIC METABOLIC PANEL
Calcium: 9.1
Chloride: 101
Creatinine, Ser: 0.84
GFR calc Af Amer: >60
GFR calc non Af Amer: >60

## 2011-04-04 LAB — DIFFERENTIAL
Lymphocytes Relative: 28
Lymphs Abs: 2.2
Monocytes Relative: 7
Neutro Abs: 5.1
Neutrophils Relative %: 63

## 2011-04-06 LAB — DIFFERENTIAL
Basophils Absolute: 0.1
Basophils Relative: 1
Eosinophils Absolute: 0.1
Lymphs Abs: 1.1
Neutro Abs: 6.2

## 2011-04-06 LAB — CBC
HCT: 38.7
Hemoglobin: 12.9
MCHC: 33.4
RBC: 5.09
RDW: 16.2 — ABNORMAL HIGH

## 2011-04-06 LAB — BASIC METABOLIC PANEL
CO2: 26
Chloride: 104
Glucose, Bld: 88
Potassium: 4.4
Sodium: 138

## 2011-05-27 ENCOUNTER — Other Ambulatory Visit: Payer: Self-pay | Admitting: *Deleted

## 2011-05-27 MED ORDER — POTASSIUM CHLORIDE CRYS ER 20 MEQ PO TBCR: 20.0000 meq | EXTENDED_RELEASE_TABLET | Freq: Every day | ORAL | Status: DC

## 2011-06-07 ENCOUNTER — Telehealth: Payer: Self-pay | Admitting: *Deleted

## 2011-06-07 NOTE — Telephone Encounter (Signed)
Pt's sister left vm stating pt has a really bad cold and has had no relieve with OTC meds. She is requesting rx. Please advise.

## 2011-06-08 MED ORDER — AZITHROMYCIN 250 MG PO TABS: ORAL_TABLET | ORAL | Status: AC

## 2011-06-08 NOTE — Telephone Encounter (Signed)
Ok zpac OV if not better Thx 

## 2011-06-09 NOTE — Telephone Encounter (Signed)
Left message on machine for pt to return my call  

## 2011-06-10 NOTE — Telephone Encounter (Signed)
Pt was informed on 06-09-11.

## 2011-07-01 ENCOUNTER — Telehealth: Payer: Self-pay | Admitting: *Deleted

## 2011-07-01 MED ORDER — KETOCONAZOLE 200 MG PO TABS: 200.0000 mg | ORAL_TABLET | Freq: Every day | ORAL | Status: DC

## 2011-07-01 NOTE — Telephone Encounter (Signed)
Ok thx.

## 2011-07-01 NOTE — Telephone Encounter (Signed)
Pt is requesting Rf on Nizoral tablet. I don't see this med on her list. Please advise- ok to Rf? Strength, sig, quantity?

## 2011-07-01 NOTE — Telephone Encounter (Signed)
Pt informed

## 2011-08-05 ENCOUNTER — Other Ambulatory Visit: Payer: Self-pay | Admitting: Internal Medicine

## 2011-08-05 ENCOUNTER — Telehealth: Payer: Self-pay | Admitting: *Deleted

## 2011-08-05 NOTE — Telephone Encounter (Signed)
Pt left vm  req Zpak. Please advise.

## 2011-08-06 NOTE — Telephone Encounter (Signed)
Ok Thx 

## 2011-08-09 ENCOUNTER — Other Ambulatory Visit: Payer: Self-pay | Admitting: *Deleted

## 2011-08-09 MED ORDER — AZITHROMYCIN 250 MG PO TABS: ORAL_TABLET | ORAL | Status: DC

## 2011-10-19 ENCOUNTER — Other Ambulatory Visit: Payer: Self-pay | Admitting: *Deleted

## 2011-10-19 MED ORDER — HYDROCHLOROTHIAZIDE 25 MG PO TABS: 25.0000 mg | ORAL_TABLET | Freq: Every day | ORAL | Status: DC

## 2011-11-07 ENCOUNTER — Other Ambulatory Visit: Payer: Self-pay | Admitting: Internal Medicine

## 2011-11-07 ENCOUNTER — Telehealth: Payer: Self-pay | Admitting: *Deleted

## 2011-11-07 NOTE — Telephone Encounter (Signed)
Ok Zpac Thx 

## 2011-11-07 NOTE — Telephone Encounter (Signed)
Pt calling stating she has cough and runny nose with yellow mucous. She still has no job or ins and declines making a OV. She is requesting abx for this. Please advise.

## 2011-11-08 NOTE — Telephone Encounter (Signed)
Tried calling pt no answer

## 2011-11-09 ENCOUNTER — Telehealth: Payer: Self-pay | Admitting: Internal Medicine

## 2011-11-09 NOTE — Telephone Encounter (Signed)
Received 3 pages [facility], sent to Dr. Posey Rea. sd 11/09/11

## 2011-11-10 ENCOUNTER — Other Ambulatory Visit: Payer: Self-pay | Admitting: *Deleted

## 2011-11-10 MED ORDER — AZITHROMYCIN 250 MG PO TABS: ORAL_TABLET | ORAL | Status: DC

## 2012-01-06 ENCOUNTER — Other Ambulatory Visit: Payer: Self-pay | Admitting: Internal Medicine

## 2012-02-01 ENCOUNTER — Telehealth: Payer: Self-pay | Admitting: Internal Medicine

## 2012-02-01 MED ORDER — POTASSIUM CHLORIDE CRYS ER 20 MEQ PO TBCR: 20.0000 meq | EXTENDED_RELEASE_TABLET | Freq: Every day | ORAL | Status: DC

## 2012-02-01 MED ORDER — HYDROCHLOROTHIAZIDE 25 MG PO TABS: 25.0000 mg | ORAL_TABLET | Freq: Every day | ORAL | Status: DC

## 2012-02-01 NOTE — Telephone Encounter (Signed)
The pt is hoping to get a refill on her blood pressure and potassium medicine.  Thanks!

## 2012-03-09 ENCOUNTER — Encounter: Payer: Self-pay | Admitting: Internal Medicine

## 2012-03-09 ENCOUNTER — Ambulatory Visit (INDEPENDENT_AMBULATORY_CARE_PROVIDER_SITE_OTHER): Payer: Self-pay | Admitting: Internal Medicine

## 2012-03-09 VITALS — BP 128/80 | HR 76 | Temp 98.2°F | Resp 16 | Wt 136.2 lb

## 2012-03-09 DIAGNOSIS — B354 Tinea corporis: Secondary | ICD-10-CM

## 2012-03-09 DIAGNOSIS — J45909 Unspecified asthma, uncomplicated: Secondary | ICD-10-CM

## 2012-03-09 DIAGNOSIS — Z23 Encounter for immunization: Secondary | ICD-10-CM

## 2012-03-09 DIAGNOSIS — E538 Deficiency of other specified B group vitamins: Secondary | ICD-10-CM

## 2012-03-09 DIAGNOSIS — G43909 Migraine, unspecified, not intractable, without status migrainosus: Secondary | ICD-10-CM

## 2012-03-09 MED ORDER — CITALOPRAM HYDROBROMIDE 40 MG PO TABS: 40.0000 mg | ORAL_TABLET | Freq: Every day | ORAL | Status: DC

## 2012-03-09 MED ORDER — HYDROCHLOROTHIAZIDE 25 MG PO TABS: 25.0000 mg | ORAL_TABLET | Freq: Every day | ORAL | Status: DC

## 2012-03-09 MED ORDER — DIAZEPAM 5 MG PO TABS: 5.0000 mg | ORAL_TABLET | Freq: Two times a day (BID) | ORAL | Status: DC | PRN

## 2012-03-09 MED ORDER — FLUCONAZOLE 150 MG PO TABS: 150.0000 mg | ORAL_TABLET | Freq: Once | ORAL | Status: DC

## 2012-03-09 MED ORDER — OMEPRAZOLE-SODIUM BICARBONATE 40-1100 MG PO CAPS: 1.0000 | ORAL_CAPSULE | Freq: Every day | ORAL | Status: DC

## 2012-03-09 MED ORDER — MECLIZINE HCL 25 MG PO TABS: 25.0000 mg | ORAL_TABLET | ORAL | Status: DC | PRN

## 2012-03-09 MED ORDER — KETOCONAZOLE 200 MG PO TABS: 200.0000 mg | ORAL_TABLET | Freq: Every day | ORAL | Status: DC

## 2012-03-09 MED ORDER — CYANOCOBALAMIN 1000 MCG/ML IJ SOLN
1000.0000 ug | Freq: Once | INTRAMUSCULAR | Status: AC
  Administered 2012-03-09: 1000 ug via INTRAMUSCULAR

## 2012-03-09 MED ORDER — POTASSIUM CHLORIDE CRYS ER 20 MEQ PO TBCR: 20.0000 meq | EXTENDED_RELEASE_TABLET | Freq: Every day | ORAL | Status: DC

## 2012-03-09 MED ORDER — BUDESONIDE-FORMOTEROL FUMARATE 160-4.5 MCG/ACT IN AERO: 2.0000 | INHALATION_SPRAY | Freq: Two times a day (BID) | RESPIRATORY_TRACT | Status: DC

## 2012-03-09 NOTE — Assessment & Plan Note (Signed)
Continue with current prescription therapy as reflected on the Med list.  

## 2012-03-09 NOTE — Assessment & Plan Note (Signed)
Ketotoconazole 2 wks

## 2012-03-09 NOTE — Progress Notes (Signed)
  Subjective:    Patient ID: [name], female    DOB: 1971-02-01, 41 y.o.   MRN: [id number]  HPI The patient is here for a wellness exam. The patient has been doing well overall without major physical or psychological issues going on lately, except for depression. The patient needs to address chronic anxiety, edema, migraines  Wt Readings from Last 3 Encounters:  03/09/12 136 lb 4 oz (61.803 kg)  07/09/10 130 lb (58.968 kg)  12/02/09 136 lb (61.689 kg)   BP Readings from Last 3 Encounters:  03/09/12 128/80  07/09/10 138/90  12/02/09 110/80     Review of Systems  Constitutional: Positive for fatigue. Negative for fever, chills, activity change, appetite change and unexpected weight change.  HENT: Positive for congestion and postnasal drip. Negative for nosebleeds, mouth sores, neck pain, sinus pressure and ear discharge.   Eyes: Negative for pain, discharge and visual disturbance.  Respiratory: Negative for cough, chest tightness, wheezing and stridor.   Gastrointestinal: Negative for nausea, abdominal pain, diarrhea and constipation.  Genitourinary: Negative for urgency, frequency, difficulty urinating and vaginal pain.  Musculoskeletal: Negative for back pain and gait problem.  Skin: Negative for pallor and rash.  Neurological: Positive for headaches. Negative for dizziness, tremors, weakness and numbness.  Psychiatric/Behavioral: Positive for decreased concentration. Negative for suicidal ideas, confusion and disturbed wake/sleep cycle. The patient is nervous/anxious.        Objective:   Physical Exam  Constitutional: She appears well-developed and well-nourished. No distress.  HENT:  Head: Normocephalic.  Right Ear: External ear normal.  Left Ear: External ear normal.  Nose: Nose normal.  Mouth/Throat: Oropharynx is clear and moist.  Eyes: Conjunctivae normal are normal. Pupils are equal, round, and reactive to light. Right eye exhibits no discharge. Left eye  exhibits no discharge.  Neck: Normal range of motion. Neck supple. No JVD present. No tracheal deviation present. No thyromegaly present.  Cardiovascular: Normal rate, regular rhythm and normal heart sounds.   No murmur heard. Pulmonary/Chest: No stridor. No respiratory distress. She has no wheezes.  Abdominal: Soft. Bowel sounds are normal. She exhibits no distension and no mass. There is no tenderness. There is no rebound and no guarding.  Musculoskeletal: She exhibits no edema and no tenderness.  Lymphadenopathy:    She has no cervical adenopathy.  Neurological: She displays normal reflexes. No cranial nerve deficit. She exhibits normal muscle tone. Coordination normal.  Skin: No rash noted. No erythema.  Psychiatric: She has a normal mood and affect. Her behavior is normal. Judgment and thought content normal.   Lab Results  Component Value Date   WBC 7.5 03/24/2011   HGB 14.3 03/24/2011   HCT 42.0 03/24/2011   PLT 313 03/24/2011   GLUCOSE 81 03/24/2011   CHOL 223* 04/09/2009   TRIG 94.0 04/09/2009   HDL 38.40* 04/09/2009   LDLDIRECT 173.9 04/09/2009   ALT 11 08/04/2010   AST 19 08/04/2010   NA 141 03/24/2011   K 3.0* 03/24/2011   CL 103 03/24/2011   CREATININE 1.00 03/24/2011   BUN 8 03/24/2011   CO2 29 08/04/2010   TSH 1.374 07/14/2010           Assessment & Plan:

## 2012-03-22 ENCOUNTER — Emergency Department (HOSPITAL_COMMUNITY)
Admission: EM | Admit: 2012-03-22 | Discharge: 2012-03-22 | Disposition: A | Discharge status: Home / Self Care | Payer: Self-pay | Attending: Emergency Medicine | Admitting: Emergency Medicine

## 2012-03-22 ENCOUNTER — Emergency Department (HOSPITAL_COMMUNITY): Payer: Self-pay

## 2012-03-22 ENCOUNTER — Encounter (HOSPITAL_COMMUNITY): Payer: Self-pay | Admitting: Emergency Medicine

## 2012-03-22 DIAGNOSIS — J45909 Unspecified asthma, uncomplicated: Secondary | ICD-10-CM | POA: Insufficient documentation

## 2012-03-22 DIAGNOSIS — R5381 Other malaise: Secondary | ICD-10-CM | POA: Insufficient documentation

## 2012-03-22 DIAGNOSIS — R5383 Other fatigue: Secondary | ICD-10-CM | POA: Insufficient documentation

## 2012-03-22 DIAGNOSIS — Z79899 Other long term (current) drug therapy: Secondary | ICD-10-CM | POA: Insufficient documentation

## 2012-03-22 DIAGNOSIS — J40 Bronchitis, not specified as acute or chronic: Secondary | ICD-10-CM | POA: Insufficient documentation

## 2012-03-22 DIAGNOSIS — J329 Chronic sinusitis, unspecified: Secondary | ICD-10-CM | POA: Insufficient documentation

## 2012-03-22 DIAGNOSIS — R0602 Shortness of breath: Secondary | ICD-10-CM | POA: Insufficient documentation

## 2012-03-22 MED ORDER — IPRATROPIUM BROMIDE 0.02 % IN SOLN
0.5000 mg | Freq: Once | RESPIRATORY_TRACT | Status: AC
  Administered 2012-03-22: 0.5 mg via RESPIRATORY_TRACT
  Filled 2012-03-22: qty 2.5

## 2012-03-22 MED ORDER — PREDNISONE 10 MG PO TABS: ORAL_TABLET | ORAL | Status: DC

## 2012-03-22 MED ORDER — ALBUTEROL SULFATE (5 MG/ML) 0.5% IN NEBU
5.0000 mg | INHALATION_SOLUTION | Freq: Once | RESPIRATORY_TRACT | Status: AC
  Administered 2012-03-22: 5 mg via RESPIRATORY_TRACT
  Filled 2012-03-22: qty 1

## 2012-03-22 MED ORDER — PREDNISONE 20 MG PO TABS
60.0000 mg | ORAL_TABLET | Freq: Once | ORAL | Status: AC
  Administered 2012-03-22: 60 mg via ORAL
  Filled 2012-03-22: qty 3

## 2012-03-22 MED ORDER — MOXIFLOXACIN HCL 400 MG PO TABS: 400.0000 mg | ORAL_TABLET | Freq: Every day | ORAL | Status: DC

## 2012-03-22 NOTE — ED Provider Notes (Signed)
Medical screening examination/treatment/procedure(s) were performed by non-physician practitioner and as supervising physician I was immediately available for consultation/collaboration.   Dione Booze, MD 03/22/12 1606

## 2012-03-22 NOTE — ED Notes (Signed)
Pt returned from X-ray.  

## 2012-03-22 NOTE — ED Notes (Addendum)
Had flu shot last Friday, woke up Saturday--feeling terrible, with fever- went to Urgent Care, given Z-pak and tussionex. Finished z-pak yesterday, still feels bad, "like it has settled in chest" nasal drainage-- yellow. 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

## 2012-03-22 NOTE — ED Notes (Signed)
Patient transported to X-ray 

## 2012-03-22 NOTE — ED Notes (Signed)
RT called by Lyla Son, RN for breathing tx.

## 2012-03-22 NOTE — ED Provider Notes (Signed)
History     CSN: [id number]  Arrival date & time 03/22/12  4098   First MD Initiated Contact with Patient 03/22/12 [phone]      Chief Complaint  Patient presents with  . Weakness  . URI    (Consider location/radiation/quality/duration/timing/severity/associated sxs/prior treatment) HPI Comments: [name] is a 41 y.o. Female who presents with complaint of cough, sinus pressure, congestion, chills at home, weakness. Pt states she got a flu shot 2 wks ago, and got sick few days after. States that she was seen at an UC 1 wk ago, given a zpack and tussionex. States finished zpack with no improvement. States did not measure temperature, but states she had chills which resolved with tylenol. Pt states she has bilateral lower lungs removed as a child. Pt states she always gets sick and has bad reactions to flu. States zpack never works for her, and she usually requires neb treatments in ED and avelox. States she is feeling short of breath, and weak. Eating drinking well.   Patient is a 41 y.o. female presenting with weakness and URI.  Weakness Primary symptoms do not include headaches, dizziness, fever, nausea or vomiting.  Additional symptoms include weakness. Additional symptoms do not include neck stiffness.  URI The primary symptoms include fatigue, cough and myalgias. Primary symptoms do not include fever, headaches, ear pain, sore throat, wheezing, abdominal pain, nausea or vomiting.  The myalgias are associated with weakness.  Symptoms associated with the illness include chills, sinus pressure and congestion.    Past Medical History  Diagnosis Date  . History of DVT of lower extremity   . Personal history of PE (pulmonary embolism)   . Congenital cystic disease of lung   . Headache, migraine   . Allergic rhinitis   . Asthma   . GERD (gastroesophageal reflux disease)   . Tinea versicolor   . Onychomycosis   . Depression   . Palpitations   . Anxiety     Past Surgical  History  Procedure Date  . Lung lobectomy     RL  . Tubal ligation     Family History  Problem Relation Age of Onset  . Hypertension Other   . Diabetes Other     History  Substance Use Topics  . Smoking status: Never Smoker   . Smokeless tobacco: Not on file  . Alcohol Use:     OB History    Grav Para Term Preterm Abortions TAB SAB Ect Mult Living                  Review of Systems  Constitutional: Positive for chills and fatigue. Negative for fever.  HENT: Positive for congestion and sinus pressure. Negative for ear pain, sore throat, neck pain and neck stiffness.   Respiratory: Positive for cough and shortness of breath. Negative for chest tightness and wheezing.   Cardiovascular: Negative for chest pain, palpitations and leg swelling.  Gastrointestinal: Negative for nausea, vomiting and abdominal pain.  Genitourinary: Negative for dysuria, flank pain and decreased urine volume.  Musculoskeletal: Positive for myalgias.  Skin: Negative.   Neurological: Positive for weakness. Negative for dizziness, numbness and headaches.  Hematological: Negative.     Allergies  Guaifenesin; Iron; Codeine; Doxepin; Hydrocodone-acetaminophen; Hydromorphone hcl; Metoprolol succinate; Morphine; Pantoprazole sodium; Pseudoeph-doxylamine-dm-apap; Pseudoephedrine; Sulfonamide derivatives; Tetracycline; Penicillins; and Prednisone  Home Medications   Current Outpatient Rx  Name Route Sig Dispense Refill  . AZITHROMYCIN 250 MG PO TABS Oral Take 250-500 mg by mouth daily. 2  tabs for day 1. 1 tab every day for 4 days    . BUDESONIDE-FORMOTEROL FUMARATE 160-4.5 MCG/ACT IN AERO Inhalation Inhale 2 puffs into the lungs 2 (two) times daily. 1 Inhaler 5  . DIAZEPAM 5 MG PO TABS Oral Take 1 tablet (5 mg total) by mouth every 12 (twelve) hours as needed for anxiety. 60 tablet 2  . HYDROCHLOROTHIAZIDE 25 MG PO TABS Oral Take 1 tablet (25 mg total) by mouth daily. 90 tablet 0  . IRON PO Oral Take 1  each by mouth daily.      Marland Kitchen MECLIZINE HCL 25 MG PO TABS Oral Take 1 tablet (25 mg total) by mouth as needed. 60 tablet 1  . ADULT MULTIVITAMIN W/MINERALS CH Oral Take 1 tablet by mouth daily.    Marland Kitchen OMEPRAZOLE-SODIUM BICARBONATE 40-1100 MG PO CAPS Oral Take 1 capsule by mouth daily before breakfast. 90 capsule 3  . POTASSIUM CHLORIDE CRYS ER 20 MEQ PO TBCR Oral Take 1 tablet (20 mEq total) by mouth daily. 90 tablet 2  . SUMATRIPTAN SUCCINATE 100 MG PO TABS Oral Take 100 mg by mouth daily.      Marland Kitchen FLUCONAZOLE 150 MG PO TABS Oral Take 1 tablet (150 mg total) by mouth once. 1 tablet 1  . KETOCONAZOLE 200 MG PO TABS Oral Take 1 tablet (200 mg total) by mouth daily. 14 tablet 0    BP 113/78  Pulse 94  Temp 98 F (36.7 C) (Oral)  Resp 20  SpO2 98%  Physical Exam  Nursing note and vitals reviewed. Constitutional: She appears well-developed and well-nourished. No distress.  HENT:  Head: Normocephalic and atraumatic.  Right Ear: Tympanic membrane, external ear and ear canal normal.  Left Ear: Tympanic membrane, external ear and ear canal normal.  Nose: Rhinorrhea present.  Mouth/Throat: Uvula is midline, oropharynx is clear and moist and mucous membranes are normal.  Eyes: Conjunctivae normal are normal.  Neck: Neck supple.  Cardiovascular: Normal rate, regular rhythm and normal heart sounds.   Pulmonary/Chest: Effort normal and breath sounds normal. No respiratory distress. She has no wheezes. She has no rales.  Musculoskeletal: She exhibits no edema.  Lymphadenopathy:    She has no cervical adenopathy.  Neurological: She is alert.  Skin: Skin is warm and dry.  Psychiatric: She has a normal mood and affect.    ED Course  Procedures (including critical care time)  Pt with cough, sob, generalized weakness, URI symptoms. Will get CXR, nebs, prednisone. Vs normal.  Filed Vitals:   03/22/12 0925  BP: 113/78  Pulse: 94  Temp: 98 F (36.7 C)  Resp: 20   Dg Chest 2 View  03/22/2012   *RADIOLOGY REPORT*  Clinical Data: 41 year old female with shortness of breath, weakness, prior lung surgery, chest pain.  CHEST - 2 VIEW  Comparison: 03/24/2011 and earlier.  Findings: Chronic small bilateral pleural effusions or pleural scarring is unchanged.  Stable lung volumes.  Mild areas of linear scarring at the right medial base and in the left lung periphery are stable.  No pneumothorax or edema.  No acute pulmonary opacity. Cardiac size and mediastinal contours are within normal limits. Visualized tracheal air column is within normal limits.  No acute osseous abnormality identified.    Mild scoliosis.  IMPRESSION: Stable. No acute cardiopulmonary abnormality.   Original Report Authenticated By: Ulla Potash III, M.D.     11:17 AM Pt feeling better with neb treatment. Ready to go home. VS normal. CXR negative. Pt asking for prescription  for avelox. Explained that i believe her symptoms are due to a viral infection. Will provide her with a prescription, but asked to hold off on taking, and see if improving in the next few days. Prednisone at home for next 5 days.    1. Bronchitis   2. Sinusitis       MDM          Lottie Mussel, PA 03/22/12 1536

## 2012-04-11 ENCOUNTER — Telehealth: Payer: Self-pay | Admitting: *Deleted

## 2012-04-11 NOTE — Telephone Encounter (Signed)
Pt states after getting Flu vaccine on 03/09/12 on 03/13/12 she became sick and has been ever since. She states she has been treated by UC and ER with Zpak and prednisone and is still sick. She is requesting advisement/Rx for this.

## 2012-04-11 NOTE — Telephone Encounter (Signed)
I do not think that it is related to the flu shot, however, I can't be 100% sure... I'm no sure what I can suggest, since I do not know what is going on... OV if problems Thx

## 2012-04-12 NOTE — Telephone Encounter (Signed)
Pt informed she declined OV now but states she will call back if she decides to come in.

## 2012-04-12 NOTE — Telephone Encounter (Signed)
Pt informed. She is c/o sweats, sore throat, decreased appetite, sleeping a lot. No N&V, diarrhea, cough or URI sxs. She has also completed Avelox since all this has started. Please advise.

## 2012-04-12 NOTE — Telephone Encounter (Signed)
Still not sure - I'll be happy to see her in the office; she'll likely need labs too Thx

## 2012-07-26 ENCOUNTER — Emergency Department (HOSPITAL_BASED_OUTPATIENT_CLINIC_OR_DEPARTMENT_OTHER)
Admission: EM | Admit: 2012-07-26 | Discharge: 2012-07-26 | Disposition: A | Discharge status: Home / Self Care | Payer: Self-pay | Attending: Emergency Medicine | Admitting: Emergency Medicine

## 2012-07-26 ENCOUNTER — Encounter (HOSPITAL_BASED_OUTPATIENT_CLINIC_OR_DEPARTMENT_OTHER): Payer: Self-pay | Admitting: Emergency Medicine

## 2012-07-26 ENCOUNTER — Emergency Department (HOSPITAL_BASED_OUTPATIENT_CLINIC_OR_DEPARTMENT_OTHER): Payer: Self-pay

## 2012-07-26 DIAGNOSIS — B360 Pityriasis versicolor: Secondary | ICD-10-CM | POA: Insufficient documentation

## 2012-07-26 DIAGNOSIS — B351 Tinea unguium: Secondary | ICD-10-CM | POA: Insufficient documentation

## 2012-07-26 DIAGNOSIS — J45909 Unspecified asthma, uncomplicated: Secondary | ICD-10-CM | POA: Insufficient documentation

## 2012-07-26 DIAGNOSIS — Z86711 Personal history of pulmonary embolism: Secondary | ICD-10-CM | POA: Insufficient documentation

## 2012-07-26 DIAGNOSIS — Z87798 Personal history of other (corrected) congenital malformations: Secondary | ICD-10-CM | POA: Insufficient documentation

## 2012-07-26 DIAGNOSIS — R109 Unspecified abdominal pain: Secondary | ICD-10-CM | POA: Insufficient documentation

## 2012-07-26 DIAGNOSIS — K219 Gastro-esophageal reflux disease without esophagitis: Secondary | ICD-10-CM | POA: Insufficient documentation

## 2012-07-26 DIAGNOSIS — F411 Generalized anxiety disorder: Secondary | ICD-10-CM | POA: Insufficient documentation

## 2012-07-26 DIAGNOSIS — K5900 Constipation, unspecified: Secondary | ICD-10-CM | POA: Insufficient documentation

## 2012-07-26 DIAGNOSIS — Z8659 Personal history of other mental and behavioral disorders: Secondary | ICD-10-CM | POA: Insufficient documentation

## 2012-07-26 DIAGNOSIS — Z8679 Personal history of other diseases of the circulatory system: Secondary | ICD-10-CM | POA: Insufficient documentation

## 2012-07-26 DIAGNOSIS — Z79899 Other long term (current) drug therapy: Secondary | ICD-10-CM | POA: Insufficient documentation

## 2012-07-26 DIAGNOSIS — Z792 Long term (current) use of antibiotics: Secondary | ICD-10-CM | POA: Insufficient documentation

## 2012-07-26 DIAGNOSIS — IMO0002 Reserved for concepts with insufficient information to code with codable children: Secondary | ICD-10-CM | POA: Insufficient documentation

## 2012-07-26 DIAGNOSIS — Z7982 Long term (current) use of aspirin: Secondary | ICD-10-CM | POA: Insufficient documentation

## 2012-07-26 DIAGNOSIS — Z9851 Tubal ligation status: Secondary | ICD-10-CM | POA: Insufficient documentation

## 2012-07-26 DIAGNOSIS — G43909 Migraine, unspecified, not intractable, without status migrainosus: Secondary | ICD-10-CM | POA: Insufficient documentation

## 2012-07-26 DIAGNOSIS — Z86718 Personal history of other venous thrombosis and embolism: Secondary | ICD-10-CM | POA: Insufficient documentation

## 2012-07-26 LAB — COMPREHENSIVE METABOLIC PANEL
ALT: 18 U/L (ref 0–35)
AST: 24 U/L (ref 0–37)
Alkaline Phosphatase: 69 U/L (ref 39–117)
CO2: 27 mEq/L (ref 19–32)
Calcium: 9.9 mg/dL (ref 8.4–10.5)
GFR calc non Af Amer: 78 mL/min — ABNORMAL LOW (ref >90)
Glucose, Bld: 108 mg/dL — ABNORMAL HIGH (ref 70–99)
Potassium: 3.5 mEq/L (ref 3.5–5.1)
Sodium: 140 mEq/L (ref 135–145)

## 2012-07-26 LAB — CBC WITH DIFFERENTIAL/PLATELET
Basophils Absolute: 0 10*3/uL (ref 0.0–0.1)
Eosinophils Relative: 1 % (ref 0–5)
Lymphocytes Relative: 26 % (ref 12–46)
Lymphs Abs: 3.7 10*3/uL (ref 0.7–4.0)
MCV: 87.5 fL (ref 78.0–100.0)
Neutro Abs: 9.5 10*3/uL — ABNORMAL HIGH (ref 1.7–7.7)
Neutrophils Relative %: 66 % (ref 43–77)
Platelets: 341 10*3/uL (ref 150–400)
RBC: 4.73 MIL/uL (ref 3.87–5.11)
RDW: 12.8 % (ref 11.5–15.5)
WBC: 14.4 10*3/uL — ABNORMAL HIGH (ref 4.0–10.5)

## 2012-07-26 MED ORDER — FLEET ENEMA 7-19 GM/118ML RE ENEM
ENEMA | RECTAL | Status: AC
  Administered 2012-07-26: 2 via RECTAL
  Filled 2012-07-26: qty 2

## 2012-07-26 MED ORDER — FLEET ENEMA 7-19 GM/118ML RE ENEM
2.0000 | ENEMA | Freq: Once | RECTAL | Status: AC
  Administered 2012-07-26: 2 via RECTAL

## 2012-07-26 NOTE — ED Notes (Signed)
Patient transported to X-ray 

## 2012-07-26 NOTE — ED Notes (Signed)
MD at bedside. 

## 2012-07-26 NOTE — ED Notes (Signed)
Pt to bathroom. Pt states she had some result. Denies any pain at this time.

## 2012-07-26 NOTE — ED Provider Notes (Addendum)
History     CSN: [id number]  Arrival date & time 07/26/12  0250   First MD Initiated Contact with Patient 07/26/12 [phone]      Chief Complaint  Patient presents with  . Constipation    (Consider location/radiation/quality/duration/timing/severity/associated sxs/prior treatment) HPI Comments: Patient with constipation for the past month.  Has tried several various medications along with an enema but has had little relief.  No n/v.  No fevers or chills.  She has had trouble with her bowels since she was a child.    Patient is a 42 y.o. female presenting with constipation. The history is provided by the patient.  Constipation  Episode onset: one month ago. The onset was gradual. The problem occurs continuously. The problem has been gradually worsening. The pain is moderate. The stool is described as hard. There was no prior successful therapy.    Past Medical History  Diagnosis Date  . History of DVT of lower extremity   . Personal history of PE (pulmonary embolism)   . Congenital cystic disease of lung   . Headache, migraine   . Allergic rhinitis   . Asthma   . GERD (gastroesophageal reflux disease)   . Tinea versicolor   . Onychomycosis   . Depression   . Palpitations   . Anxiety     Past Surgical History  Procedure Date  . Lung lobectomy     RL  . Tubal ligation     Family History  Problem Relation Age of Onset  . Hypertension Other   . Diabetes Other     History  Substance Use Topics  . Smoking status: Never Smoker   . Smokeless tobacco: Not on file  . Alcohol Use:     OB History    Grav Para Term Preterm Abortions TAB SAB Ect Mult Living                  Review of Systems  Gastrointestinal: Positive for constipation.  All other systems reviewed and are negative.    Allergies  Guaifenesin; Iron; Codeine; Doxepin; Hydrocodone-acetaminophen; Hydromorphone hcl; Metoprolol succinate; Morphine; Pantoprazole sodium; Pseudoeph-doxylamine-dm-apap;  Pseudoephedrine; Sulfonamide derivatives; Tetracycline; Penicillins; and Prednisone  Home Medications   Current Outpatient Rx  Name  Route  Sig  Dispense  Refill  . ASPIRIN-ACETAMINOPHEN-CAFFEINE 250-250-65 MG PO TABS   Oral   Take 1 tablet by mouth as needed.         . BUDESONIDE-FORMOTEROL FUMARATE 160-4.5 MCG/ACT IN AERO   Inhalation   Inhale 2 puffs into the lungs 2 (two) times daily.   1 Inhaler   5   . DIAZEPAM 5 MG PO TABS   Oral   Take 1 tablet (5 mg total) by mouth every 12 (twelve) hours as needed for anxiety.   60 tablet   2   . FLUCONAZOLE 150 MG PO TABS   Oral   Take 1 tablet (150 mg total) by mouth once.   1 tablet   1   . HYDROCHLOROTHIAZIDE 25 MG PO TABS   Oral   Take 1 tablet (25 mg total) by mouth daily.   90 tablet   0   . IRON PO   Oral   Take 1 each by mouth daily.           Marland Kitchen KETOCONAZOLE 200 MG PO TABS   Oral   Take 1 tablet (200 mg total) by mouth daily.   14 tablet   0   . MECLIZINE HCL 25  MG PO TABS   Oral   Take 1 tablet (25 mg total) by mouth as needed.   60 tablet   1   . ADULT MULTIVITAMIN W/MINERALS CH   Oral   Take 1 tablet by mouth daily.         Marland Kitchen OMEPRAZOLE-SODIUM BICARBONATE 40-1100 MG PO CAPS   Oral   Take 1 capsule by mouth daily before breakfast.   90 capsule   3   . POTASSIUM CHLORIDE CRYS ER 20 MEQ PO TBCR   Oral   Take 1 tablet (20 mEq total) by mouth daily.   90 tablet   2   . AZITHROMYCIN 250 MG PO TABS   Oral   Take 250-500 mg by mouth daily. 2 tabs for day 1. 1 tab every day for 4 days         . MOXIFLOXACIN HCL 400 MG PO TABS   Oral   Take 1 tablet (400 mg total) by mouth daily.   7 tablet   0   . PREDNISONE 10 MG PO TABS      Take 5 tab day 1, take 4 tab day 2, take 3 tab day 3, take 2 tab day 4, and take 1 tab day 5   15 tablet   0   . SUMATRIPTAN SUCCINATE 100 MG PO TABS   Oral   Take 100 mg by mouth daily.             BP 137/90  Pulse 98  Temp 97.6 F (36.4 C) (Oral)   Resp 18  SpO2 100%  Physical Exam  Nursing note and vitals reviewed. Constitutional: She is oriented to person, place, and time. She appears well-developed and well-nourished. No distress.  HENT:  Head: Normocephalic and atraumatic.  Neck: Normal range of motion. Neck supple.  Cardiovascular: Normal rate and regular rhythm.  Exam reveals no gallop and no friction rub.   No murmur heard. Pulmonary/Chest: Effort normal and breath sounds normal. No respiratory distress. She has no wheezes.  Abdominal: Soft. Bowel sounds are normal. She exhibits no distension. There is no tenderness.  Genitourinary:       There is no stool in the rectal vault.  No gross blood.    Musculoskeletal: Normal range of motion.  Neurological: She is alert and oriented to person, place, and time.  Skin: Skin is warm and dry. She is not diaphoretic.    ED Course  Procedures (including critical care time)  Labs Reviewed - No data to display No results found.   No diagnosis found.    MDM  The patient presents here with a long-standing history of constipation, worse over the past month.  She feels distended, bloated, and has had no relief with multiple home and otc medications.  There is no stool in the rectum per dre and the xrays do not show a fecal impaction or abnormal bowel gas pattern.  Although her wbc is elevated, there is no focal abdominal tenderness and has been elevated in the past.  I do not believe there to be an emergent process and I believe that no further workup is indicated.  My plan is for her to  increase fiber, follow up with gastroenterology.  I attempted to discharge the patient, however she insisted upon receiving an enema before she leave.  I advised her that this was something that she could perform at home, however she refused to leave until she received it.  Someone she knew had  been given an enema and "22 lbs of stool came out".  She is concerned the same is happening with her.  She  was given two fleets enemas and will now be discharged to home.         Geoffery Lyons, MD 07/26/12 2725  Geoffery Lyons, MD 07/26/12 [phone]

## 2012-07-26 NOTE — ED Notes (Signed)
Pt reports she has not had a normal BM x 1 month. Pt states she took enema, stool softener and mirilax last pm with very little results. Pt c/o pain on right side.

## 2012-08-04 ENCOUNTER — Other Ambulatory Visit: Payer: Self-pay | Admitting: Internal Medicine

## 2012-08-30 ENCOUNTER — Telehealth: Payer: Self-pay | Admitting: *Deleted

## 2012-08-30 NOTE — Telephone Encounter (Signed)
Rf req for Diazepam 5 mg 1 po q 12 hr prn. Last filled 07/05/12. Ok to Rf?

## 2012-08-30 NOTE — Telephone Encounter (Signed)
OK to fill this prescription with additional refills x5 Thank you!  

## 2012-08-31 MED ORDER — DIAZEPAM 5 MG PO TABS: 5.0000 mg | ORAL_TABLET | Freq: Two times a day (BID) | ORAL | Status: DC | PRN

## 2012-08-31 NOTE — Telephone Encounter (Signed)
Done

## 2012-09-12 ENCOUNTER — Emergency Department (HOSPITAL_COMMUNITY)
Admission: EM | Admit: 2012-09-12 | Discharge: 2012-09-12 | Disposition: A | Discharge status: Home / Self Care | Payer: Self-pay | Attending: Emergency Medicine | Admitting: Emergency Medicine

## 2012-09-12 ENCOUNTER — Encounter (HOSPITAL_COMMUNITY): Payer: Self-pay | Admitting: Emergency Medicine

## 2012-09-12 ENCOUNTER — Emergency Department (HOSPITAL_COMMUNITY): Payer: Self-pay

## 2012-09-12 DIAGNOSIS — Z86711 Personal history of pulmonary embolism: Secondary | ICD-10-CM | POA: Insufficient documentation

## 2012-09-12 DIAGNOSIS — J45909 Unspecified asthma, uncomplicated: Secondary | ICD-10-CM | POA: Insufficient documentation

## 2012-09-12 DIAGNOSIS — F3289 Other specified depressive episodes: Secondary | ICD-10-CM | POA: Insufficient documentation

## 2012-09-12 DIAGNOSIS — R110 Nausea: Secondary | ICD-10-CM | POA: Insufficient documentation

## 2012-09-12 DIAGNOSIS — F411 Generalized anxiety disorder: Secondary | ICD-10-CM | POA: Insufficient documentation

## 2012-09-12 DIAGNOSIS — IMO0002 Reserved for concepts with insufficient information to code with codable children: Secondary | ICD-10-CM | POA: Insufficient documentation

## 2012-09-12 DIAGNOSIS — F329 Major depressive disorder, single episode, unspecified: Secondary | ICD-10-CM | POA: Insufficient documentation

## 2012-09-12 DIAGNOSIS — Z8619 Personal history of other infectious and parasitic diseases: Secondary | ICD-10-CM | POA: Insufficient documentation

## 2012-09-12 DIAGNOSIS — K5900 Constipation, unspecified: Secondary | ICD-10-CM | POA: Insufficient documentation

## 2012-09-12 DIAGNOSIS — R1084 Generalized abdominal pain: Secondary | ICD-10-CM | POA: Insufficient documentation

## 2012-09-12 DIAGNOSIS — Z86718 Personal history of other venous thrombosis and embolism: Secondary | ICD-10-CM | POA: Insufficient documentation

## 2012-09-12 DIAGNOSIS — R109 Unspecified abdominal pain: Secondary | ICD-10-CM

## 2012-09-12 DIAGNOSIS — Z8709 Personal history of other diseases of the respiratory system: Secondary | ICD-10-CM | POA: Insufficient documentation

## 2012-09-12 DIAGNOSIS — Z3202 Encounter for pregnancy test, result negative: Secondary | ICD-10-CM | POA: Insufficient documentation

## 2012-09-12 DIAGNOSIS — K219 Gastro-esophageal reflux disease without esophagitis: Secondary | ICD-10-CM | POA: Insufficient documentation

## 2012-09-12 DIAGNOSIS — G43909 Migraine, unspecified, not intractable, without status migrainosus: Secondary | ICD-10-CM | POA: Insufficient documentation

## 2012-09-12 DIAGNOSIS — Z9851 Tubal ligation status: Secondary | ICD-10-CM | POA: Insufficient documentation

## 2012-09-12 DIAGNOSIS — Z8679 Personal history of other diseases of the circulatory system: Secondary | ICD-10-CM | POA: Insufficient documentation

## 2012-09-12 DIAGNOSIS — Z79899 Other long term (current) drug therapy: Secondary | ICD-10-CM | POA: Insufficient documentation

## 2012-09-12 DIAGNOSIS — E876 Hypokalemia: Secondary | ICD-10-CM | POA: Insufficient documentation

## 2012-09-12 DIAGNOSIS — Z8775 Personal history of (corrected) congenital malformations of respiratory system: Secondary | ICD-10-CM | POA: Insufficient documentation

## 2012-09-12 LAB — CBC WITH DIFFERENTIAL/PLATELET
Eosinophils Relative: 0 % (ref 0–5)
HCT: 44.3 % (ref 36.0–46.0)
Lymphocytes Relative: 14 % (ref 12–46)
Lymphs Abs: 2.5 10*3/uL (ref 0.7–4.0)
MCH: 28.6 pg (ref 26.0–34.0)
MCV: 83.4 fL (ref 78.0–100.0)
Monocytes Absolute: 1.1 10*3/uL — ABNORMAL HIGH (ref 0.1–1.0)
Monocytes Relative: 6 % (ref 3–12)
RBC: 5.31 MIL/uL — ABNORMAL HIGH (ref 3.87–5.11)
WBC: 17.5 10*3/uL — ABNORMAL HIGH (ref 4.0–10.5)

## 2012-09-12 LAB — COMPREHENSIVE METABOLIC PANEL
ALT: 15 U/L (ref 0–35)
BUN: 11 mg/dL (ref 6–23)
CO2: 23 mEq/L (ref 19–32)
Calcium: 10.5 mg/dL (ref 8.4–10.5)
Creatinine, Ser: 0.75 mg/dL (ref 0.50–1.10)
GFR calc Af Amer: >90 mL/min (ref >90)
GFR calc non Af Amer: >90 mL/min (ref >90)
Glucose, Bld: 107 mg/dL — ABNORMAL HIGH (ref 70–99)

## 2012-09-12 LAB — URINE MICROSCOPIC-ADD ON

## 2012-09-12 LAB — URINALYSIS, ROUTINE W REFLEX MICROSCOPIC
Bilirubin Urine: NEGATIVE
Ketones, ur: NEGATIVE mg/dL
Specific Gravity, Urine: 1.012 (ref 1.005–1.030)
pH: 7.5 (ref 5.0–8.0)

## 2012-09-12 LAB — LIPASE, BLOOD: Lipase: 20 U/L (ref 11–59)

## 2012-09-12 MED ORDER — IOHEXOL 300 MG/ML  SOLN
50.0000 mL | Freq: Once | INTRAMUSCULAR | Status: AC | PRN
  Administered 2012-09-12: 50 mL via ORAL

## 2012-09-12 MED ORDER — SODIUM CHLORIDE 0.9 % IV SOLN
INTRAVENOUS | Status: DC
  Administered 2012-09-12: 13:00:00 via INTRAVENOUS

## 2012-09-12 MED ORDER — IOHEXOL 300 MG/ML  SOLN
100.0000 mL | Freq: Once | INTRAMUSCULAR | Status: AC | PRN
  Administered 2012-09-12: 100 mL via INTRAVENOUS

## 2012-09-12 MED ORDER — ONDANSETRON HCL 4 MG/2ML IJ SOLN
4.0000 mg | INTRAMUSCULAR | Status: AC | PRN
  Administered 2012-09-12 (×2): 4 mg via INTRAVENOUS
  Filled 2012-09-12 (×2): qty 2

## 2012-09-12 MED ORDER — POTASSIUM CHLORIDE 20 MEQ/15ML (10%) PO LIQD
40.0000 meq | Freq: Once | ORAL | Status: AC
  Administered 2012-09-12: 40 meq via ORAL
  Filled 2012-09-12: qty 30

## 2012-09-12 MED ORDER — DICYCLOMINE HCL 20 MG PO TABS: 20.0000 mg | ORAL_TABLET | Freq: Four times a day (QID) | ORAL | Status: DC | PRN

## 2012-09-12 MED ORDER — ONDANSETRON HCL 4 MG PO TABS: 4.0000 mg | ORAL_TABLET | Freq: Three times a day (TID) | ORAL | Status: DC | PRN

## 2012-09-12 MED ORDER — DICYCLOMINE HCL 10 MG/ML IM SOLN
20.0000 mg | Freq: Once | INTRAMUSCULAR | Status: AC
  Administered 2012-09-12: 20 mg via INTRAMUSCULAR
  Filled 2012-09-12: qty 4

## 2012-09-12 NOTE — ED Notes (Signed)
Patient transported to CT 

## 2012-09-12 NOTE — ED Provider Notes (Signed)
History     CSN: [id number]  Arrival date & time 09/12/12  1137   First MD Initiated Contact with Patient 09/12/12 1227      Chief Complaint  Patient presents with  . Abdominal Pain    HPI Pt was seen at 1240.   Per pt, c/o gradual onset and persistence of constant generalized abd "pain" since last night.  Has been associated with nausea and "constipation." Describes the abd pain as "cramping."  States she has a hx of chronic constipation, but does not take a daily stool softener or laxative.  States she has not been to her GI MD for several years.  Denies vomiting/diarrhea, no fevers, no back pain, no rash, no CP/SOB, no black or blood in stools or emesis.       Past Medical History  Diagnosis Date  . History of DVT of lower extremity   . Personal history of PE (pulmonary embolism)   . Congenital cystic disease of lung   . Headache, migraine   . Allergic rhinitis   . Asthma   . GERD (gastroesophageal reflux disease)   . Tinea versicolor   . Onychomycosis   . Depression   . Palpitations   . Anxiety   . Constipation     Past Surgical History  Procedure Laterality Date  . Lung lobectomy      RL  . Tubal ligation      Family History  Problem Relation Age of Onset  . Hypertension Other   . Diabetes Other     History  Substance Use Topics  . Smoking status: Never Smoker   . Smokeless tobacco: Not on file  . Alcohol Use: Not on file    Review of Systems ROS: Statement: All systems negative except as marked or noted in the HPI; Constitutional: Negative for fever and chills. ; ; Eyes: Negative for eye pain, redness and discharge. ; ; ENMT: Negative for ear pain, hoarseness, nasal congestion, sinus pressure and sore throat. ; ; Cardiovascular: Negative for chest pain, palpitations, diaphoresis, dyspnea and peripheral edema. ; ; Respiratory: Negative for cough, wheezing and stridor. ; ; Gastrointestinal: +abd pain, constipation, nausea. Negative for vomiting, diarrhea,  blood in stool, hematemesis, jaundice and rectal bleeding. . ; ; Genitourinary: Negative for dysuria, flank pain and hematuria. ; ; Musculoskeletal: Negative for back pain and neck pain. Negative for swelling and trauma.; ; Skin: Negative for pruritus, rash, abrasions, blisters, bruising and skin lesion.; ; Neuro: Negative for headache, lightheadedness and neck stiffness. Negative for weakness, altered level of consciousness , altered mental status, extremity weakness, paresthesias, involuntary movement, seizure and syncope.       Allergies  Guaifenesin; Iron; Codeine; Doxepin; Hydrocodone-acetaminophen; Hydromorphone hcl; Metoprolol succinate; Morphine; Pantoprazole sodium; Pseudoeph-doxylamine-dm-apap; Pseudoephedrine; Sulfonamide derivatives; Tetracycline; and Prednisone  Home Medications   Current Outpatient Rx  Name  Route  Sig  Dispense  Refill  . aspirin-acetaminophen-caffeine (EXCEDRIN MIGRAINE) 250-250-65 MG per tablet   Oral   Take 1 tablet by mouth as needed.         . budesonide-formoterol (SYMBICORT) 160-4.5 MCG/ACT inhaler   Inhalation   Inhale 2 puffs into the lungs 2 (two) times daily.   1 Inhaler   5   . diazepam (VALIUM) 5 MG tablet   Oral   Take 1 tablet (5 mg total) by mouth every 12 (twelve) hours as needed for anxiety.   60 tablet   5   . hydrochlorothiazide (HYDRODIURIL) 25 MG tablet  take 1 tablet by mouth once daily   90 tablet   2   . IRON PO   Oral   Take 1 each by mouth daily.           . meclizine (ANTIVERT) 25 MG tablet   Oral   Take 1 tablet (25 mg total) by mouth as needed.   60 tablet   1   . Multiple Vitamin (MULTIVITAMIN WITH MINERALS) TABS   Oral   Take 1 tablet by mouth daily.         Marland Kitchen omeprazole-sodium bicarbonate (ZEGERID) 40-1100 MG per capsule   Oral   Take 1 capsule by mouth daily before breakfast.   90 capsule   3   . potassium chloride SA (KLOR-CON M20) 20 MEQ tablet   Oral   Take 1 tablet (20 mEq total) by  mouth daily.   90 tablet   2   . SUMAtriptan (IMITREX) 100 MG tablet   Oral   Take 100 mg by mouth daily.             BP 139/96  Temp(Src) 98.2 F (36.8 C) (Oral)  Resp 20  LMP 09/07/2012  Physical Exam 1245: Physical examination:  Nursing notes reviewed; Vital signs and O2 SAT reviewed;  Constitutional: Well developed, Well nourished, Well hydrated, Uncomfortable appearing; Head:  Normocephalic, atraumatic; Eyes: EOMI, PERRL, No scleral icterus; ENMT: Mouth and pharynx normal, Mucous membranes moist; Neck: Supple, Full range of motion, No lymphadenopathy; Cardiovascular: Regular rate and rhythm, No murmur, rub, or gallop; Respiratory: Breath sounds clear & equal bilaterally, No rales, rhonchi, wheezes.  Speaking full sentences with ease, Normal respiratory effort/excursion; Chest: Nontender, Movement normal; Abdomen: Soft, +mild diffuse tenderness to palp. No rebound or guarding. Nondistended, Normal bowel sounds; Genitourinary: No CVA tenderness; Extremities: Pulses normal, No tenderness, No edema, No calf edema or asymmetry.; Neuro: AA&Ox3, Major CN grossly intact.  Speech clear. No gross focal motor or sensory deficits in extremities.; Skin: Color normal, Warm, Dry.   ED Course  Procedures     MDM  MDM Reviewed: previous chart, nursing note and vitals Reviewed previous: labs Interpretation: labs and CT scan     Results for orders placed during the hospital encounter of 09/12/12  URINALYSIS, ROUTINE W REFLEX MICROSCOPIC      Result Value Range   Color, Urine YELLOW  YELLOW   APPearance CLEAR  CLEAR   Specific Gravity, Urine 1.012  1.005 - 1.030   pH 7.5  5.0 - 8.0   Glucose, UA NEGATIVE  NEGATIVE mg/dL   Hgb urine dipstick MODERATE (*) NEGATIVE   Bilirubin Urine NEGATIVE  NEGATIVE   Ketones, ur NEGATIVE  NEGATIVE mg/dL   Protein, ur NEGATIVE  NEGATIVE mg/dL   Urobilinogen, UA 0.2  0.0 - 1.0 mg/dL   Nitrite NEGATIVE  NEGATIVE   Leukocytes, UA NEGATIVE  NEGATIVE   PREGNANCY, URINE      Result Value Range   Preg Test, Ur NEGATIVE  NEGATIVE  CBC WITH DIFFERENTIAL      Result Value Range   WBC 17.5 (*) 4.0 - 10.5 K/uL   RBC 5.31 (*) 3.87 - 5.11 MIL/uL   Hemoglobin 15.2 (*) 12.0 - 15.0 g/dL   HCT 11.9  14.7 - 82.9 %   MCV 83.4  78.0 - 100.0 fL   MCH 28.6  26.0 - 34.0 pg   MCHC 34.3  30.0 - 36.0 g/dL   RDW 56.2  13.0 - 86.5 %   Platelets 360  150 - 400 K/uL   Neutrophils Relative 80 (*) 43 - 77 %   Neutro Abs 13.9 (*) 1.7 - 7.7 K/uL   Lymphocytes Relative 14  12 - 46 %   Lymphs Abs 2.5  0.7 - 4.0 K/uL   Monocytes Relative 6  3 - 12 %   Monocytes Absolute 1.1 (*) 0.1 - 1.0 K/uL   Eosinophils Relative 0  0 - 5 %   Eosinophils Absolute 0.1  0.0 - 0.7 K/uL   Basophils Relative 0  0 - 1 %   Basophils Absolute 0.0  0.0 - 0.1 K/uL  COMPREHENSIVE METABOLIC PANEL      Result Value Range   Sodium 140  135 - 145 mEq/L   Potassium 2.9 (*) 3.5 - 5.1 mEq/L   Chloride 97  96 - 112 mEq/L   CO2 23  19 - 32 mEq/L   Glucose, Bld 107 (*) 70 - 99 mg/dL   BUN 11  6 - 23 mg/dL   Creatinine, Ser 4.09  0.50 - 1.10 mg/dL   Calcium 81.1  8.4 - 91.4 mg/dL   Total Protein 8.2  6.0 - 8.3 g/dL   Albumin 4.3  3.5 - 5.2 g/dL   AST 22  0 - 37 U/L   ALT 15  0 - 35 U/L   Alkaline Phosphatase 76  39 - 117 U/L   Total Bilirubin 0.3  0.3 - 1.2 mg/dL   GFR calc non Af Amer >90  >90 mL/min   GFR calc Af Amer >90  >90 mL/min  LIPASE, BLOOD      Result Value Range   Lipase 20  11 - 59 U/L  URINE MICROSCOPIC-ADD ON      Result Value Range   Squamous Epithelial / LPF FEW (*) RARE   RBC / HPF 3-6  <3 RBC/hpf   Bacteria, UA FEW (*) RARE   Ct Abdomen Pelvis W Contrast 09/12/2012  *RADIOLOGY REPORT*  Clinical Data: Lower abdominal pain, nausea and constipation.  CT ABDOMEN AND PELVIS WITH CONTRAST  Technique:  Multidetector CT imaging of the abdomen and pelvis was performed following the standard protocol during bolus administration of intravenous contrast.  Contrast:  OMNIPAQUE IOHEXOL 300 MG/ML  SOLN  Comparison: None.  Findings: Moderate sized hiatal hernia present.  The liver shows mild and diffuse fatty infiltration.  No hepatic masses or biliary ductal dilatation identified.  The gallbladder, pancreas, spleen, adrenal glands and kidneys are within normal limits.  Bowel loops are unremarkable and show no evidence of obstruction or inflammation.  No abnormal fluid collections are identified.  The bladder is moderately distended.  A trace amount of free fluid is present adjacent to the uterus.  No enlarged lymph nodes, focal masses or hernias are identified.  Bony structures show disc space narrowing at the L5-S1 level.  IMPRESSION: No acute findings.  Moderate hiatal hernia and mild fatty infiltration of the liver.   Original Report Authenticated By: Irish Lack, M.D.      1530:  Pt has tol PO well while in the ED without N/V.  No stooling while in the ED.  Abd benign, VSS. Wants to go home now. Potassium repleted PO.  Pt states she has a potassium rx at home.  Dx and testing d/w pt and family.  Questions answered.  Verb understanding, agreeable to d/c home with outpt f/u.         Laray Anger, DO 09/14/12 1914

## 2012-09-12 NOTE — ED Notes (Addendum)
Patient states she has a h/o constipation and abdominal pain.  Patient states she her last BM (which was normal) was 2 days ago.  Patient states she has been having severe lower abdominal cramping and nausea.  Patient began taking a new OTC  probiotic 3 weeks ago.  Patient denies fevers.

## 2012-09-13 ENCOUNTER — Telehealth: Payer: Self-pay | Admitting: Internal Medicine

## 2012-09-13 ENCOUNTER — Encounter (HOSPITAL_COMMUNITY): Payer: Self-pay | Admitting: *Deleted

## 2012-09-13 ENCOUNTER — Emergency Department (HOSPITAL_COMMUNITY)
Admission: EM | Admit: 2012-09-13 | Discharge: 2012-09-13 | Disposition: A | Discharge status: Home / Self Care | Payer: Self-pay | Attending: Emergency Medicine | Admitting: Emergency Medicine

## 2012-09-13 DIAGNOSIS — F411 Generalized anxiety disorder: Secondary | ICD-10-CM | POA: Insufficient documentation

## 2012-09-13 DIAGNOSIS — G43909 Migraine, unspecified, not intractable, without status migrainosus: Secondary | ICD-10-CM | POA: Insufficient documentation

## 2012-09-13 DIAGNOSIS — Z86711 Personal history of pulmonary embolism: Secondary | ICD-10-CM | POA: Insufficient documentation

## 2012-09-13 DIAGNOSIS — E876 Hypokalemia: Secondary | ICD-10-CM

## 2012-09-13 DIAGNOSIS — F329 Major depressive disorder, single episode, unspecified: Secondary | ICD-10-CM | POA: Insufficient documentation

## 2012-09-13 DIAGNOSIS — E860 Dehydration: Secondary | ICD-10-CM | POA: Insufficient documentation

## 2012-09-13 DIAGNOSIS — R1110 Vomiting, unspecified: Secondary | ICD-10-CM | POA: Insufficient documentation

## 2012-09-13 DIAGNOSIS — K219 Gastro-esophageal reflux disease without esophagitis: Secondary | ICD-10-CM | POA: Insufficient documentation

## 2012-09-13 DIAGNOSIS — K2970 Gastritis, unspecified, without bleeding: Secondary | ICD-10-CM | POA: Insufficient documentation

## 2012-09-13 DIAGNOSIS — Z7982 Long term (current) use of aspirin: Secondary | ICD-10-CM | POA: Insufficient documentation

## 2012-09-13 DIAGNOSIS — J45909 Unspecified asthma, uncomplicated: Secondary | ICD-10-CM | POA: Insufficient documentation

## 2012-09-13 DIAGNOSIS — Z86718 Personal history of other venous thrombosis and embolism: Secondary | ICD-10-CM | POA: Insufficient documentation

## 2012-09-13 DIAGNOSIS — B338 Other specified viral diseases: Secondary | ICD-10-CM | POA: Insufficient documentation

## 2012-09-13 DIAGNOSIS — Z872 Personal history of diseases of the skin and subcutaneous tissue: Secondary | ICD-10-CM | POA: Insufficient documentation

## 2012-09-13 DIAGNOSIS — Z8679 Personal history of other diseases of the circulatory system: Secondary | ICD-10-CM | POA: Insufficient documentation

## 2012-09-13 DIAGNOSIS — Z79899 Other long term (current) drug therapy: Secondary | ICD-10-CM | POA: Insufficient documentation

## 2012-09-13 DIAGNOSIS — Z8719 Personal history of other diseases of the digestive system: Secondary | ICD-10-CM | POA: Insufficient documentation

## 2012-09-13 DIAGNOSIS — Z8775 Personal history of (corrected) congenital malformations of respiratory system: Secondary | ICD-10-CM | POA: Insufficient documentation

## 2012-09-13 DIAGNOSIS — F3289 Other specified depressive episodes: Secondary | ICD-10-CM | POA: Insufficient documentation

## 2012-09-13 DIAGNOSIS — Z8619 Personal history of other infectious and parasitic diseases: Secondary | ICD-10-CM | POA: Insufficient documentation

## 2012-09-13 LAB — POCT I-STAT, CHEM 8
Calcium, Ion: 1.27 mmol/L — ABNORMAL HIGH (ref 1.12–1.23)
Chloride: 102 mEq/L (ref 96–112)
Glucose, Bld: 120 mg/dL — ABNORMAL HIGH (ref 70–99)
HCT: 46 % (ref 36.0–46.0)

## 2012-09-13 MED ORDER — SODIUM CHLORIDE 0.9 % IV SOLN
INTRAVENOUS | Status: DC
  Administered 2012-09-13: 15:00:00 via INTRAVENOUS

## 2012-09-13 MED ORDER — PROMETHAZINE HCL 25 MG PO TABS: 25.0000 mg | ORAL_TABLET | Freq: Four times a day (QID) | ORAL | Status: DC | PRN

## 2012-09-13 MED ORDER — PROMETHAZINE HCL 25 MG/ML IJ SOLN
25.0000 mg | Freq: Once | INTRAMUSCULAR | Status: AC
  Administered 2012-09-13: 25 mg via INTRAVENOUS
  Filled 2012-09-13: qty 1

## 2012-09-13 MED ORDER — SODIUM CHLORIDE 0.9 % IV BOLUS (SEPSIS)
1000.0000 mL | Freq: Once | INTRAVENOUS | Status: AC
  Administered 2012-09-13: 1000 mL via INTRAVENOUS

## 2012-09-13 MED ORDER — MECLIZINE HCL 25 MG PO TABS
25.0000 mg | ORAL_TABLET | Freq: Once | ORAL | Status: AC
  Administered 2012-09-13: 25 mg via ORAL
  Filled 2012-09-13: qty 1

## 2012-09-13 MED ORDER — POTASSIUM CHLORIDE CRYS ER 20 MEQ PO TBCR
40.0000 meq | EXTENDED_RELEASE_TABLET | Freq: Once | ORAL | Status: AC
  Administered 2012-09-13: 40 meq via ORAL
  Filled 2012-09-13: qty 2

## 2012-09-13 MED ORDER — KETOROLAC TROMETHAMINE 30 MG/ML IJ SOLN
30.0000 mg | Freq: Once | INTRAMUSCULAR | Status: AC
  Administered 2012-09-13: 30 mg via INTRAVENOUS
  Filled 2012-09-13: qty 1

## 2012-09-13 NOTE — ED Provider Notes (Signed)
History     CSN: [id number]  Arrival date & time 09/13/12  1327   First MD Initiated Contact with Patient 09/13/12 1329      No chief complaint on file.   (Consider location/radiation/quality/duration/timing/severity/associated sxs/prior treatment) HPI Comments: [name] is a 42 y.o. female with history of constipation and pulmonary embolism presents emergency department complaining of emesis.  Patient states that she was evaluated in the hospital yesterday for abdominal pain that has since resolved.  At that time she is CT imaging performed showing no acute abdominal process.  Patient believes to developed a viral gastritis while in hospital he says she reports having hyperemesis ever since discharge.  Patient states that she's had 10 episodes of vomiting but denies any current abdominal pain, diarrhea, fever, night sweats or chills.  Emesis unrelieved by emesis.  Symptoms are moderate.  She denies melena, hematuria, chest pain, leg swelling, shortness of breath.  No other complaints at this time.  The history is provided by the patient.    Past Medical History  Diagnosis Date  . History of DVT of lower extremity   . Personal history of PE (pulmonary embolism)   . Congenital cystic disease of lung   . Headache, migraine   . Allergic rhinitis   . Asthma   . GERD (gastroesophageal reflux disease)   . Tinea versicolor   . Onychomycosis   . Depression   . Palpitations   . Anxiety   . Constipation     Past Surgical History  Procedure Laterality Date  . Lung lobectomy      RL  . Tubal ligation      Family History  Problem Relation Age of Onset  . Hypertension Other   . Diabetes Other     History  Substance Use Topics  . Smoking status: Never Smoker   . Smokeless tobacco: Not on file  . Alcohol Use: Not on file    OB History   Grav Para Term Preterm Abortions TAB SAB Ect Mult Living                  Review of Systems  All other systems reviewed and are  negative.    Allergies  Guaifenesin; Iron; Codeine; Doxepin; Hydrocodone-acetaminophen; Hydromorphone hcl; Metoprolol succinate; Morphine; Pantoprazole sodium; Pseudoeph-doxylamine-dm-apap; Pseudoephedrine; Sulfonamide derivatives; Tetracycline; and Prednisone  Home Medications   Current Outpatient Rx  Name  Route  Sig  Dispense  Refill  . aspirin-acetaminophen-caffeine (EXCEDRIN MIGRAINE) 250-250-65 MG per tablet   Oral   Take 1 tablet by mouth as needed.         . budesonide-formoterol (SYMBICORT) 160-4.5 MCG/ACT inhaler   Inhalation   Inhale 2 puffs into the lungs 2 (two) times daily.   1 Inhaler   5   . diazepam (VALIUM) 5 MG tablet   Oral   Take 1 tablet (5 mg total) by mouth every 12 (twelve) hours as needed for anxiety.   60 tablet   5   . dicyclomine (BENTYL) 20 MG tablet   Oral   Take 1 tablet (20 mg total) by mouth every 6 (six) hours as needed (abdominal cramping).   15 tablet   0   . hydrochlorothiazide (HYDRODIURIL) 25 MG tablet      take 1 tablet by mouth once daily   90 tablet   2   . IRON PO   Oral   Take 1 each by mouth daily.           Marland Kitchen  meclizine (ANTIVERT) 25 MG tablet   Oral   Take 1 tablet (25 mg total) by mouth as needed.   60 tablet   1   . Multiple Vitamin (MULTIVITAMIN WITH MINERALS) TABS   Oral   Take 1 tablet by mouth daily.         Marland Kitchen omeprazole-sodium bicarbonate (ZEGERID) 40-1100 MG per capsule   Oral   Take 1 capsule by mouth daily before breakfast.   90 capsule   3   . ondansetron (ZOFRAN) 4 MG tablet   Oral   Take 1 tablet (4 mg total) by mouth every 8 (eight) hours as needed for nausea.   6 tablet   0   . potassium chloride SA (KLOR-CON M20) 20 MEQ tablet   Oral   Take 1 tablet (20 mEq total) by mouth daily.   90 tablet   2   . SUMAtriptan (IMITREX) 100 MG tablet   Oral   Take 100 mg by mouth daily.             LMP 09/07/2012  Physical Exam  Nursing note and vitals reviewed. Constitutional: She  is oriented to person, place, and time. She appears well-developed and well-nourished. No distress.  HENT:  Head: Normocephalic and atraumatic.  Mucous membranes dry  Eyes: Conjunctivae and EOM are normal.  Neck: Normal range of motion.  Cardiovascular:  Mild tachycardia.  Intact distal pulses.  Pulmonary/Chest: Effort normal.  Lungs good auscultation, no chest wall tenderness.  Abdominal:  Soft nontender abdomen, no peritoneal signs.  Musculoskeletal: Normal range of motion.  Neurological: She is alert and oriented to person, place, and time.  Skin: Skin is warm and dry. No rash noted. She is not diaphoretic.  Good skin turgor  Psychiatric: She has a normal mood and affect. Her behavior is normal.    ED Course  Procedures (including critical care time)  Labs Reviewed - No data to display Ct Abdomen Pelvis W Contrast  09/12/2012  *RADIOLOGY REPORT*  Clinical Data: Lower abdominal pain, nausea and constipation.  CT ABDOMEN AND PELVIS WITH CONTRAST  Technique:  Multidetector CT imaging of the abdomen and pelvis was performed following the standard protocol during bolus administration of intravenous contrast.  Contrast: OMNIPAQUE IOHEXOL 300 MG/ML  SOLN  Comparison: None.  Findings: Moderate sized hiatal hernia present.  The liver shows mild and diffuse fatty infiltration.  No hepatic masses or biliary ductal dilatation identified.  The gallbladder, pancreas, spleen, adrenal glands and kidneys are within normal limits.  Bowel loops are unremarkable and show no evidence of obstruction or inflammation.  No abnormal fluid collections are identified.  The bladder is moderately distended.  A trace amount of free fluid is present adjacent to the uterus.  No enlarged lymph nodes, focal masses or hernias are identified.  Bony structures show disc space narrowing at the L5-S1 level.  IMPRESSION: No acute findings.  Moderate hiatal hernia and mild fatty infiltration of the liver.   Original Report  Authenticated By: Irish Lack, M.D.      No diagnosis found.    MDM  Viral gastritis Patient to ER with complaint of hyperemesis after evaluation yesterday for abdominal pain.  CT performed yesterday with no acute abdominal findings.  Hypokalemia repleted in emergency department orally.  By mouth fluid challenged past.  Patient on reexam without any abdominal pain and improved vital signs.  At this time there does not appear to be any evidence of an acute emergency medical condition and the  patient appears stable for discharge with appropriate outpatient follow up.Diagnosis was discussed with patient who verbalizes understanding and is agreeable to discharge. Pt case discussed with Dr. Silverio Lay who agrees with my plan.          Jaci Carrel, [place] 09/13/12 1620

## 2012-09-13 NOTE — Telephone Encounter (Signed)
Patient went to ER and they told her Potassium was low so she started taking two of her potassium pills daily, she is going to run out of the medication early and would like a refill called in to [facility] on [address] increasing her instructions to 2 pills daily

## 2012-09-13 NOTE — ED Notes (Addendum)
Pt from home accompanied by family with reports of N/V that started last night at 8pm. Pt reports being treated here yesterday for abdominal pain which has resolved. Pt reports that she has vomited about 11 times since 8pm.

## 2012-09-14 MED ORDER — POTASSIUM CHLORIDE CRYS ER 20 MEQ PO TBCR: 20.0000 meq | EXTENDED_RELEASE_TABLET | Freq: Two times a day (BID) | ORAL | Status: DC

## 2012-09-14 NOTE — Telephone Encounter (Signed)
Done will have schedulers contact pt to sched OV.

## 2012-09-14 NOTE — Telephone Encounter (Signed)
Ok to ref x 1 mo OV w/BMET and Mg Thx

## 2012-09-17 ENCOUNTER — Telehealth: Payer: Self-pay | Admitting: Internal Medicine

## 2012-09-17 NOTE — Telephone Encounter (Signed)
Stacy---Please call pt and sched OV within 1 mo per PCP. She needs to go to Lab 2-3 days prior also. Labs are entered. No more refills until seen. Thanks  Called pt, left vm to call back and schedule appt with Plot. Waiting for pt to return call.

## 2012-09-20 NOTE — ED Provider Notes (Signed)
Medical screening examination/treatment/procedure(s) were performed by non-physician practitioner and as supervising physician I was immediately available for consultation/collaboration.   Richardean Canal, MD 09/20/12 [phone]

## 2012-11-22 ENCOUNTER — Encounter (HOSPITAL_COMMUNITY): Payer: Self-pay | Admitting: Emergency Medicine

## 2012-11-22 ENCOUNTER — Emergency Department (HOSPITAL_COMMUNITY)
Admission: EM | Admit: 2012-11-22 | Discharge: 2012-11-22 | Disposition: A | Discharge status: Home / Self Care | Payer: Self-pay | Attending: Emergency Medicine | Admitting: Emergency Medicine

## 2012-11-22 DIAGNOSIS — Z8619 Personal history of other infectious and parasitic diseases: Secondary | ICD-10-CM | POA: Insufficient documentation

## 2012-11-22 DIAGNOSIS — J45909 Unspecified asthma, uncomplicated: Secondary | ICD-10-CM | POA: Insufficient documentation

## 2012-11-22 DIAGNOSIS — Z8775 Personal history of (corrected) congenital malformations of respiratory system: Secondary | ICD-10-CM | POA: Insufficient documentation

## 2012-11-22 DIAGNOSIS — Z4802 Encounter for removal of sutures: Secondary | ICD-10-CM | POA: Insufficient documentation

## 2012-11-22 DIAGNOSIS — G43909 Migraine, unspecified, not intractable, without status migrainosus: Secondary | ICD-10-CM | POA: Insufficient documentation

## 2012-11-22 DIAGNOSIS — Z79899 Other long term (current) drug therapy: Secondary | ICD-10-CM | POA: Insufficient documentation

## 2012-11-22 DIAGNOSIS — K219 Gastro-esophageal reflux disease without esophagitis: Secondary | ICD-10-CM | POA: Insufficient documentation

## 2012-11-22 DIAGNOSIS — Z8719 Personal history of other diseases of the digestive system: Secondary | ICD-10-CM | POA: Insufficient documentation

## 2012-11-22 DIAGNOSIS — Z86711 Personal history of pulmonary embolism: Secondary | ICD-10-CM | POA: Insufficient documentation

## 2012-11-22 DIAGNOSIS — F3289 Other specified depressive episodes: Secondary | ICD-10-CM | POA: Insufficient documentation

## 2012-11-22 DIAGNOSIS — F329 Major depressive disorder, single episode, unspecified: Secondary | ICD-10-CM | POA: Insufficient documentation

## 2012-11-22 DIAGNOSIS — Z86718 Personal history of other venous thrombosis and embolism: Secondary | ICD-10-CM | POA: Insufficient documentation

## 2012-11-22 DIAGNOSIS — F411 Generalized anxiety disorder: Secondary | ICD-10-CM | POA: Insufficient documentation

## 2012-11-22 DIAGNOSIS — L0390 Cellulitis, unspecified: Secondary | ICD-10-CM

## 2012-11-22 MED ORDER — TETANUS-DIPHTH-ACELL PERTUSSIS 5-2.5-18.5 LF-MCG/0.5 IM SUSP
0.5000 mL | Freq: Once | INTRAMUSCULAR | Status: AC
  Administered 2012-11-22: 0.5 mL via INTRAMUSCULAR
  Filled 2012-11-22: qty 0.5

## 2012-11-22 MED ORDER — FLUCONAZOLE 200 MG PO TABS: 300.0000 mg | ORAL_TABLET | Freq: Once | ORAL | Status: DC

## 2012-11-22 NOTE — ED Notes (Signed)
Pt was seen at Urgent care 3 weeks after seeing multiple red raised areas on face and body

## 2012-11-22 NOTE — ED Provider Notes (Signed)
History    This chart was scribed for non-physician practitioner, Junious Silk PA-C, working with Glynn Octave, MD by Donne Anon, ED Scribe. This patient was seen in room WTR6/WTR6 and the patient's care was started at 1506.   CSN: [id number]  Arrival date & time 11/22/12  1454   First MD Initiated Contact with Patient 11/22/12 1506      Chief Complaint  Patient presents with  . Abscess    abscess on chin, l/breast and l/lower abdomen  . Wound Check    Pt tx with antibiotic at Urgent care on Monday     The history is provided by the patient. No language interpreter was used.   HPI Comments: [name] is a 42 y.o. female who presents to the Emergency Department complaining of 1 week of gradual onset, gradually worsening, constant abscess to her chin and left breast. She reports she squeezed the abscess 5 days ago and put peroxide and neosporin on it. She was seen in Urgent Care again 4 days ago and was diagnosed with an abscess. She was prescribed Bactrim at that point in time and is still currently taking it. She states it has been draining and getting smaller. She states she is currently concerned that she has MRSA. She denies fever or any other pain.   Past Medical History  Diagnosis Date  . History of DVT of lower extremity   . Personal history of PE (pulmonary embolism)   . Congenital cystic disease of lung   . Headache, migraine   . Allergic rhinitis   . Asthma   . GERD (gastroesophageal reflux disease)   . Tinea versicolor   . Onychomycosis   . Depression   . Palpitations   . Anxiety   . Constipation     Past Surgical History  Procedure Laterality Date  . Lung lobectomy      RL  . Tubal ligation      Family History  Problem Relation Age of Onset  . Hypertension Other   . Diabetes Other     History  Substance Use Topics  . Smoking status: Never Smoker   . Smokeless tobacco: Never Used  . Alcohol Use: Yes     Comment: seldom    OB History    Grav Para Term Preterm Abortions TAB SAB Ect Mult Living                  Review of Systems  Constitutional: Negative for fever.  Skin: Positive for wound.  All other systems reviewed and are negative.    Allergies  Guaifenesin; Iron; Codeine; Doxepin; Hydrocodone-acetaminophen; Hydromorphone hcl; Metoprolol succinate; Morphine; Pantoprazole sodium; Pseudoeph-doxylamine-dm-apap; Pseudoephedrine; Sulfonamide derivatives; Tetracycline; and Prednisone  Home Medications   Current Outpatient Rx  Name  Route  Sig  Dispense  Refill  . aspirin-acetaminophen-caffeine (EXCEDRIN MIGRAINE) 250-250-65 MG per tablet   Oral   Take 1 tablet by mouth every 6 (six) hours as needed for pain.          . budesonide-formoterol (SYMBICORT) 160-4.5 MCG/ACT inhaler   Inhalation   Inhale 2 puffs into the lungs 2 (two) times daily.   1 Inhaler   5   . diazepam (VALIUM) 5 MG tablet   Oral   Take 1 tablet (5 mg total) by mouth every 12 (twelve) hours as needed for anxiety.   60 tablet   5   . dicyclomine (BENTYL) 20 MG tablet   Oral   Take 1 tablet (20 mg  total) by mouth every 6 (six) hours as needed (abdominal cramping).   15 tablet   0   . ferrous sulfate 325 (65 FE) MG tablet   Oral   Take 325 mg by mouth daily with breakfast.         . hydrochlorothiazide (HYDRODIURIL) 25 MG tablet   Oral   Take 25 mg by mouth daily.         . meclizine (ANTIVERT) 25 MG tablet   Oral   Take 25 mg by mouth 3 (three) times daily as needed for dizziness.         . Multiple Vitamin (MULTIVITAMIN WITH MINERALS) TABS   Oral   Take 1 tablet by mouth daily.         Marland Kitchen omeprazole-sodium bicarbonate (ZEGERID) 40-1100 MG per capsule   Oral   Take 1 capsule by mouth daily before breakfast.   90 capsule   3   . ondansetron (ZOFRAN) 4 MG tablet   Oral   Take 1 tablet (4 mg total) by mouth every 8 (eight) hours as needed for nausea.   6 tablet   0   . potassium chloride SA (KLOR-CON M20) 20  MEQ tablet   Oral   Take 1 tablet (20 mEq total) by mouth 2 (two) times daily.   60 tablet   0   . promethazine (PHENERGAN) 25 MG tablet   Oral   Take 1 tablet (25 mg total) by mouth every 6 (six) hours as needed for nausea.   10 tablet   0   . SUMAtriptan (IMITREX) 100 MG tablet   Oral   Take 100 mg by mouth every 2 (two) hours as needed for migraine.            BP 131/85  Pulse 120  Temp(Src) 98.2 F (36.8 C) (Oral)  Resp 18  Wt 136 lb (61.689 kg)  BMI 20.68 kg/m2  SpO2 96%  Physical Exam  Nursing note and vitals reviewed. Constitutional: She is oriented to person, place, and time. She appears well-developed and well-nourished. No distress.  HENT:  Head: Normocephalic and atraumatic.  Right Ear: External ear normal.  Left Ear: External ear normal.  Nose: Nose normal.  Mouth/Throat: Oropharynx is clear and moist.  Eyes: Conjunctivae are normal.  Neck: Normal range of motion.  Cardiovascular: Normal rate, regular rhythm and normal heart sounds.   Pulmonary/Chest: Effort normal and breath sounds normal. No stridor. No respiratory distress. She has no wheezes. She has no rales.  Abdominal: Soft. She exhibits no distension.  Musculoskeletal: Normal range of motion.  Neurological: She is alert and oriented to person, place, and time. She has normal strength.  Skin: Skin is warm and dry. She is not diaphoretic. No erythema.  On chin 1 cm scab without surrounding erythema. Appears to be well healing. Under left breast there was a 2 cm area of erythema with central white head. No induration or fluctuance.  Psychiatric: Her behavior is normal. Her mood appears anxious.  tearful    ED Course  Procedures (including critical care time) DIAGNOSTIC STUDIES: Oxygen Saturation is 96% on RA, adequate by my interpretation.    COORDINATION OF CARE: 3:49 PM Discussed treatment plan which includes ultrasound of left breast and possible I&D with pt at bedside and pt agreed to  plan.    4:10 PM Ultrasound performed. No drainable fluid found.   Labs Reviewed - No data to display No results found.   1. Cellulitis  MDM  Patient presents with cellulitis after an abscess was drained. Seen at [facility] on Monday and was given Bactrim. Area of erythema are not worsening, but have not resolved. I agree with Bactrim and recommended patient continue to take it as prescribed. [place] of erythema under left breast showed no drainable abscess. Discussed return precautions. Vital signs stable for discharge. Patient / Family / Caregiver informed of clinical course, understand medical decision-making process, and agree with plan.    I personally performed the services described in this documentation, which was scribed in my presence. The recorded information has been reviewed and is accurate.        Mora Bellman, PA-C 11/22/12 1627

## 2012-11-22 NOTE — Progress Notes (Signed)
[facility] did not get to see patient but will be sending her information, using the address provided.

## 2012-11-23 NOTE — ED Provider Notes (Signed)
Medical screening examination/treatment/procedure(s) were performed by non-physician practitioner and as supervising physician I was immediately available for consultation/collaboration.   Glynn Octave, MD 11/23/12 [id number]

## 2013-01-09 ENCOUNTER — Emergency Department (HOSPITAL_COMMUNITY): Payer: Self-pay

## 2013-01-09 ENCOUNTER — Encounter (HOSPITAL_COMMUNITY): Payer: Self-pay | Admitting: Emergency Medicine

## 2013-01-09 ENCOUNTER — Emergency Department (HOSPITAL_COMMUNITY)
Admission: EM | Admit: 2013-01-09 | Discharge: 2013-01-09 | Disposition: A | Discharge status: Home / Self Care | Payer: Self-pay | Attending: Emergency Medicine | Admitting: Emergency Medicine

## 2013-01-09 DIAGNOSIS — R0982 Postnasal drip: Secondary | ICD-10-CM | POA: Insufficient documentation

## 2013-01-09 DIAGNOSIS — Z8709 Personal history of other diseases of the respiratory system: Secondary | ICD-10-CM | POA: Insufficient documentation

## 2013-01-09 DIAGNOSIS — F329 Major depressive disorder, single episode, unspecified: Secondary | ICD-10-CM | POA: Insufficient documentation

## 2013-01-09 DIAGNOSIS — J209 Acute bronchitis, unspecified: Secondary | ICD-10-CM | POA: Insufficient documentation

## 2013-01-09 DIAGNOSIS — R0789 Other chest pain: Secondary | ICD-10-CM | POA: Insufficient documentation

## 2013-01-09 DIAGNOSIS — R5381 Other malaise: Secondary | ICD-10-CM | POA: Insufficient documentation

## 2013-01-09 DIAGNOSIS — Z79899 Other long term (current) drug therapy: Secondary | ICD-10-CM | POA: Insufficient documentation

## 2013-01-09 DIAGNOSIS — R5383 Other fatigue: Secondary | ICD-10-CM | POA: Insufficient documentation

## 2013-01-09 DIAGNOSIS — F411 Generalized anxiety disorder: Secondary | ICD-10-CM | POA: Insufficient documentation

## 2013-01-09 DIAGNOSIS — Z902 Acquired absence of lung [part of]: Secondary | ICD-10-CM | POA: Insufficient documentation

## 2013-01-09 DIAGNOSIS — J208 Acute bronchitis due to other specified organisms: Secondary | ICD-10-CM

## 2013-01-09 DIAGNOSIS — Z86718 Personal history of other venous thrombosis and embolism: Secondary | ICD-10-CM | POA: Insufficient documentation

## 2013-01-09 DIAGNOSIS — IMO0002 Reserved for concepts with insufficient information to code with codable children: Secondary | ICD-10-CM | POA: Insufficient documentation

## 2013-01-09 DIAGNOSIS — F3289 Other specified depressive episodes: Secondary | ICD-10-CM | POA: Insufficient documentation

## 2013-01-09 DIAGNOSIS — J45909 Unspecified asthma, uncomplicated: Secondary | ICD-10-CM | POA: Insufficient documentation

## 2013-01-09 DIAGNOSIS — Z86711 Personal history of pulmonary embolism: Secondary | ICD-10-CM | POA: Insufficient documentation

## 2013-01-09 DIAGNOSIS — Q330 Congenital cystic lung: Secondary | ICD-10-CM | POA: Insufficient documentation

## 2013-01-09 DIAGNOSIS — L539 Erythematous condition, unspecified: Secondary | ICD-10-CM | POA: Insufficient documentation

## 2013-01-09 DIAGNOSIS — K920 Hematemesis: Secondary | ICD-10-CM | POA: Insufficient documentation

## 2013-01-09 DIAGNOSIS — J309 Allergic rhinitis, unspecified: Secondary | ICD-10-CM | POA: Insufficient documentation

## 2013-01-09 DIAGNOSIS — K219 Gastro-esophageal reflux disease without esophagitis: Secondary | ICD-10-CM | POA: Insufficient documentation

## 2013-01-09 LAB — BASIC METABOLIC PANEL
BUN: 7 mg/dL (ref 6–23)
Calcium: 10.3 mg/dL (ref 8.4–10.5)
GFR calc Af Amer: >90 mL/min (ref >90)
GFR calc non Af Amer: 88 mL/min — ABNORMAL LOW (ref >90)
Glucose, Bld: 98 mg/dL (ref 70–99)
Sodium: 140 mEq/L (ref 135–145)

## 2013-01-09 LAB — CBC
Hemoglobin: 15 g/dL (ref 12.0–15.0)
MCH: 28.5 pg (ref 26.0–34.0)
MCHC: 33.2 g/dL (ref 30.0–36.0)
RDW: 12.8 % (ref 11.5–15.5)

## 2013-01-09 MED ORDER — ALBUTEROL SULFATE (5 MG/ML) 0.5% IN NEBU
5.0000 mg | INHALATION_SOLUTION | Freq: Once | RESPIRATORY_TRACT | Status: AC
  Administered 2013-01-09: 5 mg via RESPIRATORY_TRACT
  Filled 2013-01-09: qty 1

## 2013-01-09 MED ORDER — AMOXICILLIN 250 MG PO CAPS: 250.0000 mg | ORAL_CAPSULE | Freq: Two times a day (BID) | ORAL | Status: DC

## 2013-01-09 MED ORDER — CETIRIZINE HCL 10 MG PO TABS: 10.0000 mg | ORAL_TABLET | Freq: Every day | ORAL | Status: DC

## 2013-01-09 MED ORDER — AZITHROMYCIN 250 MG PO TABS: 250.0000 mg | ORAL_TABLET | Freq: Every day | ORAL | Status: DC

## 2013-01-09 NOTE — ED Provider Notes (Signed)
History    CSN: [id number] Arrival date & time 01/09/13  1539  First MD Initiated Contact with Patient 01/09/13 1546     Chief Complaint  Patient presents with  . Hematemesis   (Consider location/radiation/quality/duration/timing/severity/associated sxs/prior Treatment) The history is provided by the patient and medical records. No language interpreter was used.     [name] is a 42 y.o. female  with a hx of PE (1990's not on anticoagulation), congential lung disease, allergic rhinitis, depression presents to the Emergency Department complaining of gradual, persistent, progressively worsening cough beginning this morning.  Pt with history of recurrent bronchitis for which she normally coughs up blood.  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  Associated symptoms include sinus congestion and rhinorrhea.  She has not tried any OTC treatments or her prescribed inhalers and nothing seems to make they symptoms better or worse.  She has associated chest tightness and reports wheezing.  Pt denies fever, chills, headache, neck pain, chest pain, abd pain, nausea, vomiting, diarrhea, dysuria.    Seen on 11/22/12 for cellulitis of the chin and breast without drainable abscess.  Pt given bactrim and cleocin for this.    Past Medical History  Diagnosis Date  . History of DVT of lower extremity   . Personal history of PE (pulmonary embolism)   . Congenital cystic disease of lung   . Headache, migraine   . Allergic rhinitis   . Asthma   . GERD (gastroesophageal reflux disease)   . Tinea versicolor   . Onychomycosis   . Depression   . Palpitations   . Anxiety   . Constipation    Past Surgical History  Procedure Laterality Date  . Lung lobectomy      RL  . Tubal ligation     Family History  Problem Relation Age of Onset  . Hypertension Other   . Diabetes Other    History  Substance Use Topics  . Smoking  status: Never Smoker   . Smokeless tobacco: Never Used  . Alcohol Use: Yes     Comment: seldom   OB History   Grav Para Term Preterm Abortions TAB SAB Ect Mult Living                 Review of Systems  Constitutional: Positive for fatigue. Negative for fever, chills and appetite change.  HENT: Positive for congestion, rhinorrhea, postnasal drip and sinus pressure. Negative for ear pain, sore throat, mouth sores, neck stiffness and ear discharge.   Eyes: Negative for visual disturbance.  Respiratory: Positive for cough, chest tightness, shortness of breath and wheezing. Negative for stridor.   Cardiovascular: Negative for chest pain, palpitations and leg swelling.  Gastrointestinal: Negative for nausea, vomiting, abdominal pain and diarrhea.  Genitourinary: Negative for dysuria, urgency, frequency and hematuria.  Musculoskeletal: Negative for myalgias, back pain and arthralgias.  Skin: Negative for rash.  Neurological: Negative for syncope, light-headedness, numbness and headaches.  Hematological: Negative for adenopathy.  Psychiatric/Behavioral: The patient is not nervous/anxious.   All other systems reviewed and are negative.    Allergies  Guaifenesin; Iron; Codeine; Doxepin; Hydromorphone hcl; Metoprolol succinate; Morphine; Pantoprazole sodium; Pseudoeph-doxylamine-dm-apap; Pseudoephedrine; Sulfonamide derivatives; Tetracycline; and Prednisone  Home Medications   Current Outpatient Rx  Name  Route  Sig  Dispense  Refill  . aspirin-acetaminophen-caffeine (EXCEDRIN MIGRAINE) 250-250-65 MG per tablet   Oral   Take 2 tablets by mouth every  6 (six) hours as needed for pain.          . budesonide-formoterol (SYMBICORT) 160-4.5 MCG/ACT inhaler   Inhalation   Inhale 2 puffs into the lungs 2 (two) times daily as needed (for shortness of breath).         . diazepam (VALIUM) 5 MG tablet   Oral   Take 1 tablet (5 mg total) by mouth every 12 (twelve) hours as needed for  anxiety.   60 tablet   5   . ferrous sulfate 325 (65 FE) MG tablet   Oral   Take 325 mg by mouth daily with breakfast.         . hydrochlorothiazide (HYDRODIURIL) 25 MG tablet   Oral   Take 25 mg by mouth daily.         . meclizine (ANTIVERT) 25 MG tablet   Oral   Take 25 mg by mouth 3 (three) times daily as needed for dizziness.         . metroNIDAZOLE (FLAGYL) 500 MG tablet   Oral   Take 500 mg by mouth 2 (two) times daily. 7 day course of therapy started 01/09/13.         . Multiple Vitamin (MULTIVITAMIN WITH MINERALS) TABS   Oral   Take 1 tablet by mouth daily.         Marland Kitchen omeprazole-sodium bicarbonate (ZEGERID) 40-1100 MG per capsule   Oral   Take 1 capsule by mouth 2 (two) times daily.         . potassium chloride SA (K-DUR,KLOR-CON) 20 MEQ tablet   Oral   Take 20 mEq by mouth daily.         . SUMAtriptan (IMITREX) 100 MG tablet   Oral   Take 100 mg by mouth every 2 (two) hours as needed for migraine.          Marland Kitchen azithromycin (ZITHROMAX) 250 MG tablet   Oral   Take 1 tablet (250 mg total) by mouth daily. Take first 2 tablets together, then 1 every day until finished.   6 tablet   0   . cetirizine (ZYRTEC ALLERGY) 10 MG tablet   Oral   Take 1 tablet (10 mg total) by mouth daily.   30 tablet   1    BP 137/97  Pulse 95  Temp(Src) 98.3 F (36.8 C) (Oral)  Resp 18  SpO2 100%  LMP 01/03/2012 Physical Exam  Constitutional: She is oriented to person, place, and time. She appears well-developed and well-nourished. No distress.  HENT:  Head: Normocephalic and atraumatic.  Right Ear: Tympanic membrane, external ear and ear canal normal.  Left Ear: Tympanic membrane, external ear and ear canal normal.  Nose: Mucosal edema and rhinorrhea present. No epistaxis. Right sinus exhibits no maxillary sinus tenderness and no frontal sinus tenderness. Left sinus exhibits no maxillary sinus tenderness and no frontal sinus tenderness.  Mouth/Throat: Uvula is  midline, oropharynx is clear and moist and mucous membranes are normal. Mucous membranes are not pale and not cyanotic. No oropharyngeal exudate, posterior oropharyngeal edema, posterior oropharyngeal erythema or tonsillar abscesses.  Area of irritation noted to the chin with erythema and scab but no induration or fluctuance.  Eyes: Conjunctivae are normal. Pupils are equal, round, and reactive to light.  Neck: Normal range of motion and full passive range of motion without pain.  Cardiovascular: Normal rate, normal heart sounds and intact distal pulses.   No murmur heard. Pulmonary/Chest: Effort normal and breath sounds  normal. No accessory muscle usage or stridor. Not tachypneic. No respiratory distress. She has no decreased breath sounds. She has no wheezes. She has no rhonchi. She has no rales. She exhibits no tenderness.  Abdominal: Soft. Bowel sounds are normal. She exhibits no distension. There is no tenderness.  Musculoskeletal: Normal range of motion.  Lymphadenopathy:    She has no cervical adenopathy.  Neurological: She is alert and oriented to person, place, and time. She exhibits normal muscle tone. Coordination normal.  Skin: Skin is warm and dry. No rash noted. She is not diaphoretic. No erythema.  Psychiatric: She has a normal mood and affect.    ED Course  Procedures (including critical care time) Labs Reviewed  CBC - Abnormal; Notable for the following:    RBC 5.26 (*)    All other components within normal limits  BASIC METABOLIC PANEL - Abnormal; Notable for the following:    GFR calc non Af Amer 88 (*)    All other components within normal limits   Dg Chest 2 View  01/09/2013   *RADIOLOGY REPORT*  Clinical Data: Shortness of breath, chest pain, coughing up blood, history asthma, pulmonary embolism, GERD  CHEST - 2 VIEW  Comparison: 07/26/2012  Findings: Normal heart size and pulmonary vascularity. Small hiatal hernia. Emphysematous changes with surgical clip at right  hilum. Slight flattening of diaphragms and blunting of costophrenic angles basilar scarring/pleural thickening though minimal effusions not excluded. No acute pulmonary infiltrate, pleural effusion, or pneumothorax. Thoracolumbar scoliosis.  IMPRESSION: Emphysematous changes with suspect basilar scarring. Small hiatal hernia. No acute abnormalities.   Original Report Authenticated By: Ulyses Southward, M.D.   1. Allergic rhinitis   2. Viral bronchitis     MDM  [name] presents with URI and Hx and PE consistent with allergic rhinitis.  Pt CXR negative for acute infiltrate. I personally reviewed the imaging tests through PACS system.  I reviewed available ER/hospitalization records through the EMR.  Patients symptoms are consistent with URI, likely viral etiology. Discussed that antibiotics are not indicated for viral infections, but will d/c with azithromycin as pt states she will not get better without strong antibitoics. Pt will be discharged with symptomatic treatment.  Verbalizes understanding and is agreeable with plan. Pt is hemodynamically stable & in NAD prior to dc.  I have also discussed reasons to return immediately to the ER.  Patient expresses understanding and agrees with plan.     Dahlia Client Geneve Kimpel, PA-C 01/09/13 2318

## 2013-01-09 NOTE — ED Provider Notes (Signed)
Medical screening examination/treatment/procedure(s) were performed by non-physician practitioner and as supervising physician I was immediately available for consultation/collaboration.    Christopher J. Pollina, MD 01/09/13 2328 

## 2013-01-09 NOTE — ED Notes (Addendum)
Per pt, Pt has been on various types of antibiotics for red marks on her face, pt had an appt today for follow up and noticed she began having an "unusual cough". Pt has hx of bronchitis, PE, and pneumonia and has  coughing  episodes whenever she gets these. Pt reports she is on no blood thinners. Pt has been coughing up a moderate amount of blood with numbness in her hands with the coughing episodes.  Pt notes the blood was a medium to bright red color. Pt reports tightness in her chest but denies any pain.

## 2013-02-16 ENCOUNTER — Emergency Department (HOSPITAL_COMMUNITY): Payer: Self-pay

## 2013-02-16 ENCOUNTER — Encounter (HOSPITAL_COMMUNITY): Payer: Self-pay | Admitting: *Deleted

## 2013-02-16 ENCOUNTER — Emergency Department (HOSPITAL_COMMUNITY)
Admission: EM | Admit: 2013-02-16 | Discharge: 2013-02-16 | Disposition: A | Discharge status: Home / Self Care | Payer: Self-pay | Attending: Emergency Medicine | Admitting: Emergency Medicine

## 2013-02-16 DIAGNOSIS — Z8619 Personal history of other infectious and parasitic diseases: Secondary | ICD-10-CM | POA: Insufficient documentation

## 2013-02-16 DIAGNOSIS — R042 Hemoptysis: Secondary | ICD-10-CM | POA: Insufficient documentation

## 2013-02-16 DIAGNOSIS — Z79899 Other long term (current) drug therapy: Secondary | ICD-10-CM | POA: Insufficient documentation

## 2013-02-16 DIAGNOSIS — F411 Generalized anxiety disorder: Secondary | ICD-10-CM | POA: Insufficient documentation

## 2013-02-16 DIAGNOSIS — G43909 Migraine, unspecified, not intractable, without status migrainosus: Secondary | ICD-10-CM | POA: Insufficient documentation

## 2013-02-16 DIAGNOSIS — Z86718 Personal history of other venous thrombosis and embolism: Secondary | ICD-10-CM | POA: Insufficient documentation

## 2013-02-16 DIAGNOSIS — Z8719 Personal history of other diseases of the digestive system: Secondary | ICD-10-CM | POA: Insufficient documentation

## 2013-02-16 DIAGNOSIS — Z880 Allergy status to penicillin: Secondary | ICD-10-CM | POA: Insufficient documentation

## 2013-02-16 DIAGNOSIS — R059 Cough, unspecified: Secondary | ICD-10-CM | POA: Insufficient documentation

## 2013-02-16 DIAGNOSIS — Z86711 Personal history of pulmonary embolism: Secondary | ICD-10-CM | POA: Insufficient documentation

## 2013-02-16 DIAGNOSIS — R05 Cough: Secondary | ICD-10-CM | POA: Insufficient documentation

## 2013-02-16 DIAGNOSIS — Z5189 Encounter for other specified aftercare: Secondary | ICD-10-CM | POA: Insufficient documentation

## 2013-02-16 DIAGNOSIS — M7989 Other specified soft tissue disorders: Secondary | ICD-10-CM

## 2013-02-16 DIAGNOSIS — Z8775 Personal history of (corrected) congenital malformations of respiratory system: Secondary | ICD-10-CM | POA: Insufficient documentation

## 2013-02-16 DIAGNOSIS — J45909 Unspecified asthma, uncomplicated: Secondary | ICD-10-CM | POA: Insufficient documentation

## 2013-02-16 LAB — COMPREHENSIVE METABOLIC PANEL
Albumin: 4.3 g/dL (ref 3.5–5.2)
BUN: 10 mg/dL (ref 6–23)
Calcium: 10.7 mg/dL — ABNORMAL HIGH (ref 8.4–10.5)
Creatinine, Ser: 0.81 mg/dL (ref 0.50–1.10)
Total Bilirubin: 0.3 mg/dL (ref 0.3–1.2)
Total Protein: 8 g/dL (ref 6.0–8.3)

## 2013-02-16 LAB — CBC WITH DIFFERENTIAL/PLATELET
Basophils Absolute: 0 10*3/uL (ref 0.0–0.1)
Basophils Relative: 0 % (ref 0–1)
Eosinophils Absolute: 0.1 10*3/uL (ref 0.0–0.7)
MCH: 28.5 pg (ref 26.0–34.0)
MCHC: 33.8 g/dL (ref 30.0–36.0)
Neutrophils Relative %: 69 % (ref 43–77)
Platelets: 371 10*3/uL (ref 150–400)

## 2013-02-16 LAB — PROTIME-INR: Prothrombin Time: 13.2 seconds (ref 11.6–15.2)

## 2013-02-16 LAB — D-DIMER, QUANTITATIVE: D-Dimer, Quant: <0.27 ug/mL-FEU (ref 0.00–0.48)

## 2013-02-16 LAB — APTT: aPTT: 31 seconds (ref 24–37)

## 2013-02-16 MED ORDER — ALBUTEROL SULFATE (5 MG/ML) 0.5% IN NEBU
5.0000 mg | INHALATION_SOLUTION | Freq: Once | RESPIRATORY_TRACT | Status: AC
  Administered 2013-02-16: 5 mg via RESPIRATORY_TRACT
  Filled 2013-02-16: qty 1

## 2013-02-16 MED ORDER — IPRATROPIUM BROMIDE 0.02 % IN SOLN
0.5000 mg | Freq: Once | RESPIRATORY_TRACT | Status: AC
  Administered 2013-02-16: 0.5 mg via RESPIRATORY_TRACT
  Filled 2013-02-16: qty 2.5

## 2013-02-16 NOTE — ED Notes (Signed)
US at bedside

## 2013-02-16 NOTE — ED Notes (Signed)
[place] completed. Patient ambulated to XR with XR tech

## 2013-02-16 NOTE — ED Provider Notes (Signed)
CSN: [id number]     Arrival date & time 02/16/13  1434 History   First MD Initiated Contact with Patient 02/16/13 1503     Chief Complaint  Patient presents with  . Hemoptysis   (Consider location/radiation/quality/duration/timing/severity/associated sxs/prior Treatment) HPI  Patient reports she woke up from a nap today about 11:30 PM and she started coughing and states she knew she was sick by the way she coughed and thought she would be coughing up blood and the next thing she knew she coughed up blood about 3 times. She states it was more than a tablespoon and indicates a small amount that would be on the bottom of a cup which does not give me an idea of how much blood she coughed up. She states she still coughing however that it is no longer bloody. She states she has coughed up blood "when I'm sick" since 1991 and states she is usually diagnosed with bronchitis. She states it normally happen before fall and again before winter. She states rarely happens in the summer like it did last month in July. 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 And again in 2006 she had her right wedge resection of her lung for the same thing. She reports her pulmonologist is at [facility] a Dr. Sandy Salaam. She reports she was being seen by Dr. Kriste Basque and Dr. Delford Field but she was dismissed from their practice for missing one appointment. She denies fever but states she's short of breath when she walks. She denies any chest pain. She's requesting a breathing treatment. She takes she takes Symbicort "only when I'm sick". We discussed it was a preventative. She denies any prior history smoking but states her father used to smoke heavily when she was a child.  Patient states she is anemic from having heavy periods however she does not take her iron. She states it makes her feel bad. She states she's had a blood transfusion in the past.    PCP Dr Posey Rea  Past Medical History   Diagnosis Date  . History of DVT of lower extremity   . Personal history of PE (pulmonary embolism)   . Congenital cystic disease of lung   . Headache, migraine   . Allergic rhinitis   . Asthma   . GERD (gastroesophageal reflux disease)   . Tinea versicolor   . Onychomycosis   . Depression   . Palpitations   . Anxiety   . Constipation    Past Surgical History  Procedure Laterality Date  . Lung lobectomy      RL  . Tubal ligation     Family History  Problem Relation Age of Onset  . Hypertension Other   . Diabetes Other    History  Substance Use Topics  . Smoking status: Never Smoker   . Smokeless tobacco: Never Used  . Alcohol Use: Yes     Comment: seldom  unemployed   OB History   Grav Para Term Preterm Abortions TAB SAB Ect Mult Living                 Review of Systems  All other systems reviewed and are negative.    Allergies  Guaifenesin; Iron; Amoxapine and related; Codeine; Doxepin; Hydromorphone hcl; Metoprolol succinate; Morphine; Pantoprazole sodium; Pseudoeph-doxylamine-dm-apap; Pseudoephedrine; Sulfonamide derivatives; Tetracycline; Penicillins; and Prednisone  Home Medications   Current Outpatient Rx  Name  Route  Sig  Dispense  Refill  . aspirin-acetaminophen-caffeine (  EXCEDRIN MIGRAINE) 250-250-65 MG per tablet   Oral   Take 2 tablets by mouth every 6 (six) hours as needed for pain.          . budesonide-formoterol (SYMBICORT) 160-4.5 MCG/ACT inhaler   Inhalation   Inhale 2 puffs into the lungs 2 (two) times daily as needed (for shortness of breath).         . diazepam (VALIUM) 5 MG tablet   Oral   Take 1 tablet (5 mg total) by mouth every 12 (twelve) hours as needed for anxiety.   60 tablet   5   . ferrous sulfate 325 (65 FE) MG tablet   Oral   Take 325 mg by mouth daily with breakfast.         . hydrochlorothiazide (HYDRODIURIL) 25 MG tablet   Oral   Take 25 mg by mouth daily.         . meclizine (ANTIVERT) 25 MG  tablet   Oral   Take 25 mg by mouth 3 (three) times daily as needed for dizziness.         . Multiple Vitamin (MULTIVITAMIN WITH MINERALS) TABS   Oral   Take 1 tablet by mouth daily.         Marland Kitchen omeprazole-sodium bicarbonate (ZEGERID) 40-1100 MG per capsule   Oral   Take 1 capsule by mouth 2 (two) times daily.         . potassium chloride SA (K-DUR,KLOR-CON) 20 MEQ tablet   Oral   Take 20 mEq by mouth daily.         . SUMAtriptan (IMITREX) 100 MG tablet   Oral   Take 100 mg by mouth every 2 (two) hours as needed for migraine.           BP 129/91  Pulse 79  Temp(Src) 98.3 F (36.8 C) (Oral)  Resp 18  SpO2 98%  LMP 02/11/2013  Vital signs normal   Physical Exam  Nursing note and vitals reviewed. Constitutional: She is oriented to person, place, and time. She appears well-developed and well-nourished.  Non-toxic appearance. She does not appear ill. No distress.  HENT:  Head: Normocephalic and atraumatic.  Right Ear: External ear normal.  Left Ear: External ear normal.  Nose: Nose normal. No mucosal edema or rhinorrhea.  Mouth/Throat: Oropharynx is clear and moist and mucous membranes are normal. No dental abscesses or edematous.  Eyes: Conjunctivae and EOM are normal. Pupils are equal, round, and reactive to light.  Neck: Normal range of motion and full passive range of motion without pain. Neck supple.  Cardiovascular: Normal rate, regular rhythm and normal heart sounds.  Exam reveals no gallop and no friction rub.   No murmur heard. Pulmonary/Chest: Effort normal and breath sounds normal. No respiratory distress. She has no wheezes. She has no rhonchi. She has no rales. She exhibits no tenderness and no crepitus.  Abdominal: Soft. Normal appearance and bowel sounds are normal. She exhibits no distension. There is no tenderness. There is no rebound and no guarding.  Musculoskeletal: Normal range of motion. She exhibits no edema and no tenderness.  Moves all  extremities well.   Neurological: She is alert and oriented to person, place, and time. She has normal strength. No cranial nerve deficit.  Skin: Skin is warm, dry and intact. No rash noted. No erythema. There is pallor.  Psychiatric: She has a normal mood and affect. Her speech is normal and behavior is normal. Her mood appears not anxious.  ED Course  Procedures (including critical care time)  Medications  albuterol (PROVENTIL) (5 MG/ML) 0.5% nebulizer solution 5 mg (5 mg Nebulization Given 02/16/13 1617)  ipratropium (ATROVENT) nebulizer solution 0.5 mg (0.5 mg Nebulization Given 02/16/13 1617)    Family requesting a nebulizer for her breathing.   Recheck, pt has not had any hemoptysis while in the ED. I tried to access her records from [facility] however all I could read was a chest x-ray done in 2009 which showed a cystic lesion in the apex of her lung.  I discussed with patient that at this point antibiotics are not indicated. She's had no coughing during any time I was in her room. She does not have fever and her coughing just started a few hours ago. We discussed she most likely had a ruptured capillary blood vessel that was bleeding and has healed itself over. She does agree that she needs to see her pulmonologist. She is trying to get "Obama care". She prefers to see Dr. Ramond Dial at [facility] when she gets insurance. From what I gather from her old charts she had cystic lesions in her lungs that were removed the 2 surgeries that she had prior. There are no obvious cystic lesion seen on her chest x-ray today.  VASCULAR LAB  PRELIMINARY PRELIMINARY PRELIMINARY PRELIMINARY  Bilateral lower extremity venous duplex completed.  Preliminary report: Bilateral: No evidence of DVT, superficial thrombosis, or Baker's Cyst.  SLAUGHTER, [place], RVS  02/16/2013, 6:23 PM   Labs Review  Results for orders placed during the hospital encounter of 02/16/13  CBC WITH DIFFERENTIAL      Result Value Range    WBC 11.6 (*) 4.0 - 10.5 K/uL   RBC 4.67  3.87 - 5.11 MIL/uL   Hemoglobin 13.3  12.0 - 15.0 g/dL   HCT 16.1  09.6 - 04.5 %   MCV 84.4  78.0 - 100.0 fL   MCH 28.5  26.0 - 34.0 pg   MCHC 33.8  30.0 - 36.0 g/dL   RDW 40.9  81.1 - 91.4 %   Platelets 371  150 - 400 K/uL   Neutrophils Relative % 69  43 - 77 %   Neutro Abs 7.9 (*) 1.7 - 7.7 K/uL   Lymphocytes Relative 24  12 - 46 %   Lymphs Abs 2.8  0.7 - 4.0 K/uL   Monocytes Relative 6  3 - 12 %   Monocytes Absolute 0.7  0.1 - 1.0 K/uL   Eosinophils Relative 1  0 - 5 %   Eosinophils Absolute 0.1  0.0 - 0.7 K/uL   Basophils Relative 0  0 - 1 %   Basophils Absolute 0.0  0.0 - 0.1 K/uL  APTT      Result Value Range   aPTT 31  24 - 37 seconds  PROTIME-INR      Result Value Range   Prothrombin Time 13.2  11.6 - 15.2 seconds   INR 1.02  0.00 - 1.49  COMPREHENSIVE METABOLIC PANEL      Result Value Range   Sodium 140  135 - 145 mEq/L   Potassium 3.7  3.5 - 5.1 mEq/L   Chloride 100  96 - 112 mEq/L   CO2 25  19 - 32 mEq/L   Glucose, Bld 91  70 - 99 mg/dL   BUN 10  6 - 23 mg/dL   Creatinine, Ser 7.82  0.50 - 1.10 mg/dL   Calcium 95.6 (*) 8.4 - 10.5 mg/dL   Total Protein  8.0  6.0 - 8.3 g/dL   Albumin 4.3  3.5 - 5.2 g/dL   AST 25  0 - 37 U/L   ALT 12  0 - 35 U/L   Alkaline Phosphatase 71  39 - 117 U/L   Total Bilirubin 0.3  0.3 - 1.2 mg/dL   GFR calc non Af Amer 88 (*) >90 mL/min   GFR calc Af Amer >90  >90 mL/min  D-DIMER, QUANTITATIVE      Result Value Range   D-Dimer, Quant <0.27  0.00 - 0.48 ug/mL-FEU   Laboratory interpretation all normal except mild leukocytosis and mildly elevated calcium.      Imaging Review Dg Chest 2 View  02/16/2013   *RADIOLOGY REPORT*  Clinical Data: Three episodes of hemoptysis this morning, with prior lung surgery involving removal left lower lobe  CHEST - 2 VIEW  Comparison: 01/09/2013  Findings: The heart size and vascular pattern are normal.  There is blunting of the bilateral costophrenic angles,  unchanged.  Mild scarring left lung base stable.  No change from prior study.  Small hiatal hernia again identified.  IMPRESSION: No acute findings.   Original Report Authenticated By: Esperanza Heir, M.D.    MDM   1. Hemoptysis    Plan discharge  Devoria Albe, MD, Franz Dell, MD 02/16/13 [phone]

## 2013-02-16 NOTE — ED Notes (Signed)
RT notified of pending neb treatment. 

## 2013-02-16 NOTE — ED Notes (Signed)
Per information from triage, patient refuses to be seen by a PA, only wants to be seen by a MD

## 2013-02-16 NOTE — ED Notes (Signed)
Pt reports she was seen here last month for same symptoms, hx of 2 prior lung surgeries, had 1/3 left lower lung removed. Pt reports she had 3 episodes of coughing up bright red blood this morning. She is requesting to see a physician and pulmonary specialist

## 2013-02-16 NOTE — ED Notes (Signed)
Dr.Knapp at bedside  

## 2013-02-16 NOTE — ED Notes (Signed)
Patient A&Ox3, NAD, accompanied by her sister and aunt. Patient reports she had an episode of coughing up blood 3 times today around 1300. Patient states it was a small amount of blood each time but she is unable to approximate the volume. Patient reports she was seen about 1 month ago for same and was referred to a pulmonologist which she has not followed up with due to financial concerns. In the past patient states she has seen  Pulmonology and a physician at [facility], but has not seen them in quite some time. Patient reports dissatisfactory service with recent providers within the [facility]. I addressed patient request to be seen by a Pulmonologist today while in the ER and advised that this is not standard procedure and is not likely to occur. Patient nods in understanding. Patient requests to not have an IV started unless absolutely necessary as she states she is a difficult IV stick. Dr Lynelle Doctor at bedside. Information reviewed Dr Lynelle Doctor same as reviewed with myself and Leotis Shames, RN. Dr Lynelle Doctor requested patient to allow nursing staff to look for IV site and patient agrees. Patient assisted to restroom, urine sample collected, labeled and placed at bedside. Patient again placed on cardiac & SPO2 monitoring.

## 2013-02-16 NOTE — ED Notes (Signed)
Provider at bedside

## 2013-02-16 NOTE — Progress Notes (Signed)
VASCULAR LAB PRELIMINARY  PRELIMINARY  PRELIMINARY  PRELIMINARY  Bilateral lower extremity venous duplex completed.    Preliminary report:  Bilateral:  No evidence of DVT, superficial thrombosis, or Baker's Cyst.   Briellah Baik, RVS 02/16/2013, 6:23 PM

## 2013-02-16 NOTE — ED Notes (Signed)
Per [place] in lab blood in lab for add on D Dimer

## 2013-04-11 ENCOUNTER — Other Ambulatory Visit: Payer: Self-pay | Admitting: Internal Medicine

## 2013-04-15 ENCOUNTER — Other Ambulatory Visit: Payer: Self-pay | Admitting: Internal Medicine

## 2013-04-18 NOTE — Telephone Encounter (Deleted)
Rx request for Diazepam 5 mg  Last office visit:

## 2013-04-18 NOTE — Telephone Encounter (Signed)
Rx request for Diazepam 5 mg  Last office visit: 03/09/2012  Last filled: 08/31/2012

## 2013-05-13 ENCOUNTER — Ambulatory Visit: Payer: Self-pay | Admitting: Internal Medicine

## 2013-05-14 ENCOUNTER — Encounter: Payer: Self-pay | Admitting: Internal Medicine

## 2013-05-14 ENCOUNTER — Ambulatory Visit (INDEPENDENT_AMBULATORY_CARE_PROVIDER_SITE_OTHER): Payer: Self-pay | Admitting: Internal Medicine

## 2013-05-14 VITALS — BP 130/98 | HR 80 | Temp 97.2°F | Resp 16 | Wt 130.0 lb

## 2013-05-14 DIAGNOSIS — L42 Pityriasis rosea: Secondary | ICD-10-CM

## 2013-05-14 DIAGNOSIS — R002 Palpitations: Secondary | ICD-10-CM

## 2013-05-14 DIAGNOSIS — R21 Rash and other nonspecific skin eruption: Secondary | ICD-10-CM

## 2013-05-14 DIAGNOSIS — G43909 Migraine, unspecified, not intractable, without status migrainosus: Secondary | ICD-10-CM

## 2013-05-14 MED ORDER — ONDANSETRON HCL 4 MG PO TABS: 4.0000 mg | ORAL_TABLET | Freq: Three times a day (TID) | ORAL | Status: DC | PRN

## 2013-05-14 MED ORDER — METHYLPREDNISOLONE ACETATE 80 MG/ML IJ SUSP
80.0000 mg | Freq: Once | INTRAMUSCULAR | Status: AC
  Administered 2013-05-14: 80 mg via INTRAMUSCULAR

## 2013-05-14 MED ORDER — MECLIZINE HCL 25 MG PO TABS: 25.0000 mg | ORAL_TABLET | Freq: Three times a day (TID) | ORAL | Status: DC | PRN

## 2013-05-14 MED ORDER — OMEPRAZOLE-SODIUM BICARBONATE 40-1100 MG PO CAPS: 1.0000 | ORAL_CAPSULE | Freq: Two times a day (BID) | ORAL | Status: DC

## 2013-05-14 MED ORDER — METRONIDAZOLE 500 MG PO TABS: 500.0000 mg | ORAL_TABLET | Freq: Three times a day (TID) | ORAL | Status: DC

## 2013-05-14 MED ORDER — POTASSIUM CHLORIDE CRYS ER 20 MEQ PO TBCR: 20.0000 meq | EXTENDED_RELEASE_TABLET | Freq: Every day | ORAL | Status: DC

## 2013-05-14 MED ORDER — DIAZEPAM 5 MG PO TABS: 5.0000 mg | ORAL_TABLET | Freq: Two times a day (BID) | ORAL | Status: DC | PRN

## 2013-05-14 NOTE — Progress Notes (Signed)
Pre visit review using our clinic review tool, if applicable. No additional management support is needed unless otherwise documented below in the visit note. 

## 2013-05-14 NOTE — Assessment & Plan Note (Signed)
11/14

## 2013-05-14 NOTE — Assessment & Plan Note (Signed)
11/14 probable P. Rosea Depomedrol 80 mg im

## 2013-05-16 NOTE — Assessment & Plan Note (Signed)
No recent relapse 

## 2013-05-16 NOTE — Assessment & Plan Note (Signed)
Continue with current prn prescription therapy as reflected on the Med list.  

## 2013-05-31 ENCOUNTER — Other Ambulatory Visit: Payer: Self-pay | Admitting: Internal Medicine

## 2013-05-31 NOTE — Telephone Encounter (Signed)
Refill done.  

## 2013-09-12 ENCOUNTER — Other Ambulatory Visit: Payer: Self-pay | Admitting: Internal Medicine

## 2013-11-08 ENCOUNTER — Other Ambulatory Visit: Payer: Self-pay | Admitting: Internal Medicine

## 2013-11-12 ENCOUNTER — Other Ambulatory Visit: Payer: Self-pay | Admitting: *Deleted

## 2013-11-12 MED ORDER — HYDROCHLOROTHIAZIDE 25 MG PO TABS: 25.0000 mg | ORAL_TABLET | Freq: Every day | ORAL | Status: DC

## 2013-12-02 ENCOUNTER — Encounter (HOSPITAL_COMMUNITY): Payer: Self-pay | Admitting: Emergency Medicine

## 2013-12-02 ENCOUNTER — Emergency Department (HOSPITAL_COMMUNITY)
Admission: EM | Admit: 2013-12-02 | Discharge: 2013-12-02 | Disposition: A | Discharge status: Home / Self Care | Payer: Self-pay | Attending: Emergency Medicine | Admitting: Emergency Medicine

## 2013-12-02 ENCOUNTER — Emergency Department (HOSPITAL_COMMUNITY): Payer: Self-pay

## 2013-12-02 DIAGNOSIS — Z86718 Personal history of other venous thrombosis and embolism: Secondary | ICD-10-CM | POA: Insufficient documentation

## 2013-12-02 DIAGNOSIS — F329 Major depressive disorder, single episode, unspecified: Secondary | ICD-10-CM | POA: Insufficient documentation

## 2013-12-02 DIAGNOSIS — Z79899 Other long term (current) drug therapy: Secondary | ICD-10-CM | POA: Insufficient documentation

## 2013-12-02 DIAGNOSIS — Z8619 Personal history of other infectious and parasitic diseases: Secondary | ICD-10-CM | POA: Insufficient documentation

## 2013-12-02 DIAGNOSIS — Z8775 Personal history of (corrected) congenital malformations of respiratory system: Secondary | ICD-10-CM | POA: Insufficient documentation

## 2013-12-02 DIAGNOSIS — K219 Gastro-esophageal reflux disease without esophagitis: Secondary | ICD-10-CM | POA: Insufficient documentation

## 2013-12-02 DIAGNOSIS — G43909 Migraine, unspecified, not intractable, without status migrainosus: Secondary | ICD-10-CM | POA: Insufficient documentation

## 2013-12-02 DIAGNOSIS — F411 Generalized anxiety disorder: Secondary | ICD-10-CM | POA: Insufficient documentation

## 2013-12-02 DIAGNOSIS — Z7982 Long term (current) use of aspirin: Secondary | ICD-10-CM | POA: Insufficient documentation

## 2013-12-02 DIAGNOSIS — M79673 Pain in unspecified foot: Secondary | ICD-10-CM

## 2013-12-02 DIAGNOSIS — Z86711 Personal history of pulmonary embolism: Secondary | ICD-10-CM | POA: Insufficient documentation

## 2013-12-02 DIAGNOSIS — M19079 Primary osteoarthritis, unspecified ankle and foot: Secondary | ICD-10-CM | POA: Insufficient documentation

## 2013-12-02 DIAGNOSIS — J45909 Unspecified asthma, uncomplicated: Secondary | ICD-10-CM | POA: Insufficient documentation

## 2013-12-02 DIAGNOSIS — F3289 Other specified depressive episodes: Secondary | ICD-10-CM | POA: Insufficient documentation

## 2013-12-02 DIAGNOSIS — M1990 Unspecified osteoarthritis, unspecified site: Secondary | ICD-10-CM

## 2013-12-02 MED ORDER — MELOXICAM 15 MG PO TABS: 15.0000 mg | ORAL_TABLET | Freq: Every day | ORAL | Status: DC

## 2013-12-02 MED ORDER — DEXAMETHASONE SODIUM PHOSPHATE 10 MG/ML IJ SOLN
10.0000 mg | Freq: Once | INTRAMUSCULAR | Status: AC
  Administered 2013-12-02: 10 mg via INTRAMUSCULAR
  Filled 2013-12-02: qty 1

## 2013-12-02 NOTE — ED Notes (Signed)
Patient transported to X-ray 

## 2013-12-02 NOTE — Discharge Instructions (Signed)
Your foot x-ray today did not show any broken bones or other concerning causes to explain your pain. At this time your providers feel that your pain is most likely caused from inflammation and arthritis in the foot. Use rest, ice, compression and elevation. Followup with your primary care provider for continued evaluation and treatment.    Arthritis, Nonspecific Arthritis is inflammation of a joint. This usually means pain, redness, warmth or swelling are present. One or more joints may be involved. There are a number of types of arthritis. Your caregiver may not be able to tell what type of arthritis you have right away. CAUSES  The most common cause of arthritis is the wear and tear on the joint (osteoarthritis). This causes damage to the cartilage, which can break down over time. The knees, hips, back and neck are most often affected by this type of arthritis. Other types of arthritis and common causes of joint pain include:  Sprains and other injuries near the joint. Sometimes minor sprains and injuries cause pain and swelling that develop hours later.  Rheumatoid arthritis. This affects hands, feet and knees. It usually affects both sides of your body at the same time. It is often associated with chronic ailments, fever, weight loss and general weakness.  Crystal arthritis. Gout and pseudo gout can cause occasional acute severe pain, redness and swelling in the foot, ankle, or knee.  Infectious arthritis. Bacteria can get into a joint through a break in overlying skin. This can cause infection of the joint. Bacteria and viruses can also spread through the blood and affect your joints.  Drug, infectious and allergy reactions. Sometimes joints can become mildly painful and slightly swollen with these types of illnesses. SYMPTOMS   Pain is the main symptom.  Your joint or joints can also be red, swollen and warm or hot to the touch.  You may have a fever with certain types of arthritis, or  even feel overall ill.  The joint with arthritis will hurt with movement. Stiffness is present with some types of arthritis. DIAGNOSIS  Your caregiver will suspect arthritis based on your description of your symptoms and on your exam. Testing may be needed to find the type of arthritis:  Blood and sometimes urine tests.  X-ray tests and sometimes CT or MRI scans.  Removal of fluid from the joint (arthrocentesis) is done to check for bacteria, crystals or other causes. Your caregiver (or a specialist) will numb the area over the joint with a local anesthetic, and use a needle to remove joint fluid for examination. This procedure is only minimally uncomfortable.  Even with these tests, your caregiver may not be able to tell what kind of arthritis you have. Consultation with a specialist (rheumatologist) may be helpful. TREATMENT  Your caregiver will discuss with you treatment specific to your type of arthritis. If the specific type cannot be determined, then the following general recommendations may apply. Treatment of severe joint pain includes:  Rest.  Elevation.  Anti-inflammatory medication (for example, ibuprofen) may be prescribed. Avoiding activities that cause increased pain.  Only take over-the-counter or prescription medicines for pain and discomfort as recommended by your caregiver.  Cold packs over an inflamed joint may be used for 10 to 15 minutes every hour. Hot packs sometimes feel better, but do not use overnight. Do not use hot packs if you are diabetic without your caregiver's permission.  A cortisone shot into arthritic joints may help reduce pain and swelling.  Any acute arthritis  that gets worse over the next 1 to 2 days needs to be looked at to be sure there is no joint infection. Long-term arthritis treatment involves modifying activities and lifestyle to reduce joint stress jarring. This can include weight loss. Also, exercise is needed to nourish the joint  cartilage and remove waste. This helps keep the muscles around the joint strong. HOME CARE INSTRUCTIONS   Do not take aspirin to relieve pain if gout is suspected. This elevates uric acid levels.  Only take over-the-counter or prescription medicines for pain, discomfort or fever as directed by your caregiver.  Rest the joint as much as possible.  If your joint is swollen, keep it elevated.  Use crutches if the painful joint is in your leg.  Drinking plenty of fluids may help for certain types of arthritis.  Follow your caregiver's dietary instructions.  Try low-impact exercise such as:  Swimming.  Water aerobics.  Biking.  Walking.  Morning stiffness is often relieved by a warm shower.  Put your joints through regular range-of-motion. SEEK MEDICAL CARE IF:   You do not feel better in 24 hours or are getting worse.  You have side effects to medications, or are not getting better with treatment. SEEK IMMEDIATE MEDICAL CARE IF:   You have a fever.  You develop severe joint pain, swelling or redness.  Many joints are involved and become painful and swollen.  There is severe back pain and/or leg weakness.  You have loss of bowel or bladder control. Document Released: 07/14/2004 Document Revised: 08/29/2011 Document Reviewed: 07/30/2008 [facility] HospitalExitCare Patient Information 2014 [place] TerraceExitCare, [place].

## 2013-12-02 NOTE — ED Provider Notes (Signed)
CSN: [id number]     Arrival date & time 12/02/13  1932 History   None    This chart was scribed for non-physician practitioner, Ivonne AndrewPeter Atalie Oros PA-C, working with Doug SouSam Jacubowitz, MD by Arlan OrganAshley Leger, ED Scribe. This patient was seen in room WTR8/WTR8 and the patient's care was started at 8:25 PM.   Chief Complaint  Patient presents with  . Foot Pain   The history is provided by the patient. No language interpreter was used.    HPI Comments: [name] is a 43 y.o. female with a PMHx of DVT, PE, GERD, Tinea Versicolor, depression, and anxiety who presents to the Emergency Department complaining of constant, moderate R foot pain x 4 days after waking from sleep. States pain is unchanged at this time. She has also noted redness, warmth, and swelling to the foot. States she has experienced similar symptoms a couple of months ago. However, states symptoms resolved without treatment. Pt states pain is exacerbated with weight bearing and ambulation. No alleviating factors. She has tried prescribed Ibuprofen and Vicodin with temporary improvement. At this time she denies any fever, chills, weakness, numbness, or loss of sensation. Pt was last screened for blood clots 12/2012 without any abnormal findings. She reports a family history of Gout, but denies a personal history. She has been using a walker to assist with ambulation. She has no other pertinent past medical history. No other concerns this visit.   Past Medical History  Diagnosis Date  . History of DVT of lower extremity   . Personal history of PE (pulmonary embolism)   . Congenital cystic disease of lung   . Headache, migraine   . Allergic rhinitis   . Asthma   . GERD (gastroesophageal reflux disease)   . Tinea versicolor   . Onychomycosis   . Depression   . Palpitations   . Anxiety   . Constipation    Past Surgical History  Procedure Laterality Date  . Lung lobectomy      RL  . Tubal ligation     Family History  Problem  Relation Age of Onset  . Hypertension Other   . Diabetes Other    History  Substance Use Topics  . Smoking status: Never Smoker   . Smokeless tobacco: Never Used  . Alcohol Use: Yes     Comment: seldom   OB History   Grav Para Term Preterm Abortions TAB SAB Ect Mult Living                 Review of Systems  Constitutional: Negative for fever and chills.  HENT: Negative for congestion.   Eyes: Negative for redness.  Respiratory: Negative for cough.   Musculoskeletal: Positive for arthralgias (R foot).  Skin: Negative for rash.  Neurological: Negative for weakness and numbness.  Psychiatric/Behavioral: Negative for confusion.      Allergies  Guaifenesin; Iron; Amoxapine and related; Codeine; Doxepin; Hydromorphone hcl; Metoprolol succinate; Morphine; Pantoprazole sodium; Pseudoeph-doxylamine-dm-apap; Pseudoephedrine; Sulfonamide derivatives; Tetracycline; and Prednisone  Home Medications   Prior to Admission medications   Medication Sig Start Date End Date Taking? Authorizing Provider  aspirin-acetaminophen-caffeine (EXCEDRIN MIGRAINE) [phone] MG per tablet Take 2 tablets by mouth every 6 (six) hours as needed for pain.     Historical Provider, MD  budesonide-formoterol (SYMBICORT) 160-4.5 MCG/ACT inhaler Inhale 2 puffs into the lungs 2 (two) times daily as needed (for shortness of breath).    Historical Provider, MD  diazepam (VALIUM) 5 MG tablet TAKE 1 TABLET BY  MOUTH EVERY 12 HOURS AS NEEDED FOR ANXIETY    Aleksei Plotnikov V, MD  ferrous sulfate 325 (65 FE) MG tablet Take 325 mg by mouth daily with breakfast.    Historical Provider, MD  hydrochlorothiazide (HYDRODIURIL) 25 MG tablet Take 1 tablet (25 mg total) by mouth daily. 11/12/13   Aleksei Plotnikov V, MD  meclizine (ANTIVERT) 25 MG tablet Take 1 tablet (25 mg total) by mouth 3 (three) times daily as needed for dizziness. 05/14/13   Aleksei Plotnikov V, MD  metroNIDAZOLE (FLAGYL) 500 MG tablet Take 1 tablet (500 mg  total) by mouth 3 (three) times daily. 05/14/13   Aleksei Plotnikov V, MD  Multiple Vitamin (MULTIVITAMIN WITH MINERALS) TABS Take 1 tablet by mouth daily.    Historical Provider, MD  omeprazole-sodium bicarbonate (ZEGERID) 40-1100 MG per capsule Take 1 capsule by mouth 2 (two) times daily. 05/14/13   Aleksei Plotnikov V, MD  ondansetron (ZOFRAN) 4 MG tablet Take 1 tablet (4 mg total) by mouth every 8 (eight) hours as needed for nausea or vomiting. 05/14/13   Aleksei Plotnikov V, MD  potassium chloride SA (K-DUR,KLOR-CON) 20 MEQ tablet Take 1 tablet (20 mEq total) by mouth daily. 05/14/13   Aleksei Plotnikov V, MD  SUMAtriptan (IMITREX) 100 MG tablet Take 100 mg by mouth every 2 (two) hours as needed for migraine.     Historical Provider, MD   Triage Vitals: BP 126/82  Pulse 94  Temp(Src) 97.8 F (36.6 C) (Oral)  Resp 18  Ht 5' 7.5" (1.715 m)  Wt 136 lb (61.689 kg)  BMI 20.97 kg/m2  SpO2 100%  LMP 11/08/2013   Physical Exam  Nursing note and vitals reviewed. Constitutional: She is oriented to person, place, and time. She appears well-developed and well-nourished. No distress.  HENT:  Head: Normocephalic.  Eyes: EOM are normal.  Neck: Normal range of motion.  Pulmonary/Chest: Effort normal. No respiratory distress.  Musculoskeletal: Normal range of motion. She exhibits tenderness.  Small amount of erythema to the dorsal right foot with tenderness to palpation. Slight increased warmth. No significant swelling. No gross deformities. Normal dorsal pedal pulses. Normal sensation in the toes with capillary refill less than 2 seconds.  Neurological: She is alert and oriented to person, place, and time.  Skin: Skin is warm. No rash noted.  Psychiatric: She has a normal mood and affect.    ED Course  Procedures  DIAGNOSTIC STUDIES: Oxygen Saturation is 100% on RA, Normal by my interpretation.    COORDINATION OF CARE: 8:31 PM- Will order X-Ray. Discussed treatment plan with pt at  bedside and pt agreed to plan.     X-rays reviewed and discussed with patient. No signs of fractures or other concerning changes to explain pain. At this time suspect type of arthritis within the middle foot. Discussed treatment plan with the rest, ice and elevation as well as anti-inflammatory medicines  Imaging Review Dg Foot Complete Right  12/02/2013   CLINICAL DATA:  Lateral foot pain  EXAM: RIGHT FOOT COMPLETE - 3+ VIEW  COMPARISON:  None.  FINDINGS: There is no evidence of fracture or dislocation. There is a small plantar calcaneal spur. There is no evidence of arthropathy or other focal bone abnormality. Soft tissues are unremarkable.  IMPRESSION: No acute osseous injury of the right foot.   Electronically Signed   By: Elige Ko   On: 12/02/2013 21:19    MDM   Final diagnoses:  Foot pain  Arthritis      I  personally performed the services described in this documentation, which was scribed in my presence. The recorded information has been reviewed and is accurate.    Angus Sellereter S Gray Maugeri, PA-C 12/02/13 2142

## 2013-12-02 NOTE — ED Notes (Signed)
Pt states woke up Thursday w/ R foot pain, states top and bottom of foot hurt along with 2nd and 3rd toe, states has had this before and went away but this time it hasn't, states today started having some redness to top of foot and swelling, small amount of redness noted.

## 2013-12-03 NOTE — ED Provider Notes (Signed)
Medical screening examination/treatment/procedure(s) were performed by non-physician practitioner and as supervising physician I was immediately available for consultation/collaboration.   EKG Interpretation None       Doug SouSam Taeya Theall, MD 12/03/13 [phone]

## 2014-01-02 ENCOUNTER — Telehealth: Payer: Self-pay | Admitting: Internal Medicine

## 2014-01-02 NOTE — Telephone Encounter (Signed)
Patient Information:  Caller Name: Bonita QuinLinda  Phone: [phone]) [phone]  Patient: Luvenia StarchHorton, Rashell  Gender: Female  DOB: 12/10/1970  Age: 43 Years  PCP: Plotnikov, Alex (Adults only)  Pregnant: No  Office Follow Up:  Does the office need to follow up with this patient?: Yes  Instructions For The Office: see notes  RN Note:  Pt states she doesn't have insurance and spoke to front desk who advised appt would cost her $80 out of pocket. She is requesting appt with Dr. Posey ReaPlotnikov tomorrow. No appts available with Dr. Posey ReaPlotnikov tomorrow so triage RN booked her in with Dr. Katrinka BlazingSmith at 3:15pm. Pt would still like to know if Dr. Posey ReaPlotnikov could possibly work her in for tomorrow and also wants to know if she sees Dr. Katrinka BlazingSmith, will the cost still be the quoted $80? Assured her will send message now and someone will call her back either today or first thing tomorrow morning in regards to her questions. Agreed to plan.  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 Was given steroid injection which she states helped tremendously and sent home on Mobic. Completed the Mobic approx 1-2 wks ago and today same pain has returned. Pain is not as bad as it was when she went to ED yet. States she has to walk on her heel on that foot. No fever or any other sxs.  Reviewed Health History In EMR: Yes  Reviewed Medications In EMR: Yes  Reviewed Allergies In EMR: Yes  Reviewed Surgeries / Procedures: Yes  Date of Onset of Symptoms: 01/02/2014 OB / GYN:  LMP: Unknown  Guideline(s) Used:  Foot Pain  Disposition Per Guideline:   See Within 3 Days in Office  Reason For Disposition Reached:   Patient wants to be seen  Advice Given:  N/A  Patient Will Follow Care Advice:  YES  Appointment Scheduled:  01/03/2014 15:15:00 Appointment Scheduled Provider:  Terrilee FilesSmith, Zach

## 2014-01-03 ENCOUNTER — Encounter: Payer: Self-pay | Admitting: Family Medicine

## 2014-01-03 ENCOUNTER — Ambulatory Visit (INDEPENDENT_AMBULATORY_CARE_PROVIDER_SITE_OTHER): Payer: Self-pay | Admitting: Family Medicine

## 2014-01-03 VITALS — BP 120/80 | HR 110 | Temp 98.0°F | Ht 68.0 in | Wt 132.0 lb

## 2014-01-03 DIAGNOSIS — M109 Gout, unspecified: Secondary | ICD-10-CM

## 2014-01-03 DIAGNOSIS — M10071 Idiopathic gout, right ankle and foot: Secondary | ICD-10-CM

## 2014-01-03 MED ORDER — KETOROLAC TROMETHAMINE 60 MG/2ML IM SOLN
60.0000 mg | Freq: Once | INTRAMUSCULAR | Status: AC
  Administered 2014-01-03: 60 mg via INTRAMUSCULAR

## 2014-01-03 MED ORDER — ALLOPURINOL 100 MG PO TABS: 200.0000 mg | ORAL_TABLET | Freq: Every day | ORAL | Status: DC

## 2014-01-03 MED ORDER — METHYLPREDNISOLONE ACETATE 80 MG/ML IJ SUSP
80.0000 mg | Freq: Once | INTRAMUSCULAR | Status: AC
  Administered 2014-01-03: 80 mg via INTRAMUSCULAR

## 2014-01-03 NOTE — Assessment & Plan Note (Signed)
This patient's second flare in the last month. We discussed different diet changes. Patient was given a shot of Toradol as well as Depo-Medrol. Warned of potential side effects. We discussed icing as well as will do ibuprofen 3 times daily for the next 3 days. Patient was also given a prescription for allopurinol. Because patient does not have any insurance is the only medication that could be affordable. Colchicine would have been another alternative. Patient was given a handout of different foods to avoid. Patient come back again in 7-10 days for further evaluation.

## 2014-01-03 NOTE — Patient Instructions (Addendum)
Good to meet you Allopurinol nightly. Start with 1 pill a day for 1 week then 2 pills nightly thereafter.  Ice bath 20 minutes daily.  Try the ibuprofen 800mg  3 times daily for next 3 days.  Come back in 7-10 days.   Gout Gout is an inflammatory arthritis caused by a buildup of uric acid crystals in the joints. Uric acid is a chemical that is normally present in the blood. When the level of uric acid in the blood is too high it can form crystals that deposit in your joints and tissues. This causes joint redness, soreness, and swelling (inflammation). Repeat attacks are common. Over time, uric acid crystals can form into masses (tophi) near a joint, destroying bone and causing disfigurement. Gout is treatable and often preventable. CAUSES  The disease begins with elevated levels of uric acid in the blood. Uric acid is produced by your body when it breaks down a naturally found substance called purines. Certain foods you eat, such as meats and fish, contain high amounts of purines. Causes of an elevated uric acid level include:  Being passed down from parent to child (heredity).  Diseases that cause increased uric acid production (such as obesity, psoriasis, and certain cancers).  Excessive alcohol use.  Diet, especially diets rich in meat and seafood.  Medicines, including certain cancer-fighting medicines (chemotherapy), water pills (diuretics), and aspirin.  Chronic kidney disease. The kidneys are no longer able to remove uric acid well.  Problems with metabolism. Conditions strongly associated with gout include:  Obesity.  High blood pressure.  High cholesterol.  Diabetes. Not everyone with elevated uric acid levels gets gout. It is not understood why some people get gout and others do not. Surgery, joint injury, and eating too much of certain foods are some of the factors that can lead to gout attacks. SYMPTOMS   An attack of gout comes on quickly. It causes intense pain with  redness, swelling, and warmth in a joint.  Fever can occur.  Often, only one joint is involved. Certain joints are more commonly involved:  Base of the big toe.  Knee.  Ankle.  Wrist.  Finger. Without treatment, an attack usually goes away in a few days to weeks. Between attacks, you usually will not have symptoms, which is different from many other forms of arthritis. DIAGNOSIS  Your caregiver will suspect gout based on your symptoms and exam. In some cases, tests may be recommended. The tests may include:  Blood tests.  Urine tests.  X-rays.  Joint fluid exam. This exam requires a needle to remove fluid from the joint (arthrocentesis). Using a microscope, gout is confirmed when uric acid crystals are seen in the joint fluid. TREATMENT  There are two phases to gout treatment: treating the sudden onset (acute) attack and preventing attacks (prophylaxis).  Treatment of an Acute Attack.  Medicines are used. These include anti-inflammatory medicines or steroid medicines.  An injection of steroid medicine into the affected joint is sometimes necessary.  The painful joint is rested. Movement can worsen the arthritis.  You may use warm or cold treatments on painful joints, depending which works best for you.  Treatment to Prevent Attacks.  If you suffer from frequent gout attacks, your caregiver may advise preventive medicine. These medicines are started after the acute attack subsides. These medicines either help your kidneys eliminate uric acid from your body or decrease your uric acid production. You may need to stay on these medicines for a very long time.  The early phase of treatment with preventive medicine can be associated with an increase in acute gout attacks. For this reason, during the first few months of treatment, your caregiver may also advise you to take medicines usually used for acute gout treatment. Be sure you understand your caregiver's directions. Your  caregiver may make several adjustments to your medicine dose before these medicines are effective.  Discuss dietary treatment with your caregiver or dietitian. Alcohol and drinks high in sugar and fructose and foods such as meat, poultry, and seafood can increase uric acid levels. Your caregiver or dietician can advise you on drinks and foods that should be limited. HOME CARE INSTRUCTIONS   Do not take aspirin to relieve pain. This raises uric acid levels.  Only take over-the-counter or prescription medicines for pain, discomfort, or fever as directed by your caregiver.  Rest the joint as much as possible. When in bed, keep sheets and blankets off painful areas.  Keep the affected joint raised (elevated).  Apply warm or cold treatments to painful joints. Use of warm or cold treatments depends on which works best for you.  Use crutches if the painful joint is in your leg.  Drink enough fluids to keep your urine clear or pale yellow. This helps your body get rid of uric acid. Limit alcohol, sugary drinks, and fructose drinks.  Follow your dietary instructions. Pay careful attention to the amount of protein you eat. Your daily diet should emphasize fruits, vegetables, whole grains, and fat-free or low-fat milk products. Discuss the use of coffee, vitamin C, and cherries with your caregiver or dietician. These may be helpful in lowering uric acid levels.  Maintain a healthy body weight. SEEK MEDICAL CARE IF:   You develop diarrhea, vomiting, or any side effects from medicines.  You do not feel better in 24 hours, or you are getting worse. SEEK IMMEDIATE MEDICAL CARE IF:   Your joint becomes suddenly more tender, and you have chills or a fever. MAKE SURE YOU:   Understand these instructions.  Will watch your condition.  Will get help right away if you are not doing well or get worse. Document Released: 06/03/2000 Document Revised: 10/01/2012 Document Reviewed: 01/18/2012 [facility]  Patient Information 2015 [place], [place]. This information is not intended to replace advice given to you by your health care provider. Make sure you discuss any questions you have with your health care provider.

## 2014-01-03 NOTE — Progress Notes (Signed)
  Tawana ScaleZach Smith D.O. [facility] [address] [place], [place] [address] Phone: [phone] Subjective:      CC: Right foot pain  BJY:NWGNFAOZHYHPI:Subjective Carla FeltyBrandi D Gordon is a 43 y.o. female coming in with complaint of right foot pain. Patient was seen previously in the emergency department approximately one month ago. Patient had what appeared to be a gouty flare at that time. Patient never had gout previously but does have a family history significant for her. Patient denies any true injury, denies any fevers or chills and denies any recent injury. Patient states that this occurred when she woke up. Unable to bear weight has redness of the foot, and severely tender to even light palpation. Patient is unable to wear shoe. Otherwise has been feeling like her normal self. Patient previously was given a steroid injection and portal meloxicam with some improvement. Patient is a severity of 8/10. Has not tried any all modalities at this time.     Past medical history, social, surgical and family history all reviewed in electronic medical record.   Review of Systems: No headache, visual changes, nausea, vomiting, diarrhea, constipation, dizziness, abdominal pain, skin rash, fevers, chills, night sweats, weight loss, swollen lymph nodes, body aches, joint swelling, muscle aches, chest pain, shortness of breath, mood changes.   Objective Blood pressure 120/80, pulse 110, temperature 98 F (36.7 C), temperature source Oral, height 5\' 8"  (1.727 m), weight 132 lb (59.875 kg), SpO2 96.00%.  General: No apparent distress alert and oriented x3 mood and affect normal, dressed appropriately.  HEENT: Pupils equal, extraocular movements intact  Respiratory: Patient's speak in full sentences and does not appear short of breath  Cardiovascular: No lower extremity edema, non tender, no erythema  Skin: Warm dry intact with no signs of infection or rash on extremities or on axial skeleton.  Abdomen: Soft  nontender  Neuro: Cranial nerves II through XII are intact, neurovascularly intact in all extremities with 2+ DTRs and 2+ pulses.  Lymph: No lymphadenopathy of posterior or anterior cervical chain or axillae bilaterally.  Gait normal with good balance and coordination.  MSK:  Non tender with full range of motion and good stability and symmetric strength and tone of shoulders, elbows, wrist, hip, knee and ankles bilaterally.  Foot exam shows the patient does have some hypererythema changes and severe tenderness to palpation over the first and second toes. She is even tender to light palpation. Neurovascularly intact distally  Limited musculoskeletal ultrasound was performed and interpreted by me today. Limited ultrasound shows the patient has some mild nonspecific soft tissue hypoechoic changes but no specific uric crystals noted. Joints look relatively normal.  Impression: Nonspecific soft tissue swelling.   Impression and Recommendations:     This case required medical decision making of moderate complexity.

## 2014-01-03 NOTE — Progress Notes (Signed)
Pre visit review using our clinic review tool, if applicable. No additional management support is needed unless otherwise documented below in the visit note. 

## 2014-02-27 ENCOUNTER — Other Ambulatory Visit: Payer: Self-pay | Admitting: Internal Medicine

## 2014-04-16 ENCOUNTER — Other Ambulatory Visit: Payer: Self-pay | Admitting: Internal Medicine

## 2014-04-17 NOTE — Telephone Encounter (Signed)
Ok to refill? Last OV 11.25.14

## 2014-07-01 ENCOUNTER — Other Ambulatory Visit: Payer: Self-pay | Admitting: Internal Medicine

## 2014-08-24 ENCOUNTER — Other Ambulatory Visit: Payer: Self-pay | Admitting: Internal Medicine

## 2014-08-27 ENCOUNTER — Other Ambulatory Visit: Payer: Self-pay | Admitting: *Deleted

## 2014-08-27 MED ORDER — HYDROCHLOROTHIAZIDE 25 MG PO TABS: 25.0000 mg | ORAL_TABLET | Freq: Every day | ORAL | Status: DC

## 2014-10-01 ENCOUNTER — Other Ambulatory Visit: Payer: Self-pay | Admitting: Internal Medicine

## 2014-10-02 NOTE — Telephone Encounter (Signed)
Needs OV.  

## 2014-11-06 ENCOUNTER — Other Ambulatory Visit: Payer: Self-pay | Admitting: Internal Medicine

## 2014-11-24 ENCOUNTER — Other Ambulatory Visit: Payer: Self-pay | Admitting: Internal Medicine

## 2014-12-18 ENCOUNTER — Telehealth: Payer: Self-pay | Admitting: Internal Medicine

## 2014-12-18 NOTE — Telephone Encounter (Signed)
OK to fill this prescription with additional refills x5.  Sch OV when she can Thank you!

## 2014-12-18 NOTE — Telephone Encounter (Signed)
Please advise, thanks.

## 2014-12-18 NOTE — Telephone Encounter (Signed)
Patient need a refill of HCTZ, she know she need an office visit, but she lost her job and don't have the money to come in, so could you please grant her another refill. It would be most appreciated. Thank you so much

## 2014-12-19 ENCOUNTER — Other Ambulatory Visit: Payer: Self-pay

## 2014-12-19 MED ORDER — HYDROCHLOROTHIAZIDE 25 MG PO TABS: 25.0000 mg | ORAL_TABLET | Freq: Every day | ORAL | Status: DC

## 2014-12-19 NOTE — Telephone Encounter (Signed)
hctz rx sent to pharm 

## 2015-08-14 ENCOUNTER — Telehealth: Payer: Self-pay | Admitting: *Deleted

## 2015-08-14 NOTE — Telephone Encounter (Signed)
Received call pt states MD rx diazepam for her and she is suppose to take a drug test for a job next week. She is concern that the diazepam will show up in her urine. Wanting to get a letter from MD stating that he has rx diazepam for her. She states she really don't take it all the time just when she need too. Inform pt MD is out of office will be Monday before msg will be address...[name]

## 2015-08-15 NOTE — Telephone Encounter (Signed)
Ok Pls give a letter Thx

## 2015-08-17 ENCOUNTER — Other Ambulatory Visit: Payer: Self-pay | Admitting: *Deleted

## 2015-08-17 MED ORDER — DIAZEPAM 5 MG PO TABS: 5.0000 mg | ORAL_TABLET | Freq: Two times a day (BID) | ORAL | Status: DC | PRN

## 2015-08-17 NOTE — Telephone Encounter (Signed)
Rf req for Diazepam 5 mg 1 po q 12 hours prn. Med is pended. Ok to Rf?

## 2015-08-17 NOTE — Telephone Encounter (Signed)
sch OV 

## 2015-08-18 MED ORDER — HYDROCHLOROTHIAZIDE 25 MG PO TABS: 25.0000 mg | ORAL_TABLET | Freq: Every day | ORAL | Status: DC

## 2015-08-18 NOTE — Telephone Encounter (Signed)
Done. HCTZ 25 mg was also sent. See meds.

## 2015-08-18 NOTE — Telephone Encounter (Signed)
Generated letter place up for pick-up. Pt stated she didn't have a # for [place] to call her back she will call  Back to see if letter is ready...[name]

## 2015-08-18 NOTE — Addendum Note (Signed)
Addended by: Merrilyn Puma on: 08/18/2015 04:27 PM   Modules accepted: Orders

## 2015-09-15 ENCOUNTER — Encounter: Payer: Self-pay | Admitting: Internal Medicine

## 2015-09-15 ENCOUNTER — Other Ambulatory Visit (INDEPENDENT_AMBULATORY_CARE_PROVIDER_SITE_OTHER): Payer: Self-pay

## 2015-09-15 ENCOUNTER — Ambulatory Visit (INDEPENDENT_AMBULATORY_CARE_PROVIDER_SITE_OTHER): Payer: Self-pay | Admitting: Internal Medicine

## 2015-09-15 VITALS — BP 120/90 | HR 108 | Ht 68.0 in | Wt 127.0 lb

## 2015-09-15 DIAGNOSIS — E538 Deficiency of other specified B group vitamins: Secondary | ICD-10-CM

## 2015-09-15 DIAGNOSIS — K2100 Gastro-esophageal reflux disease with esophagitis, without bleeding: Secondary | ICD-10-CM

## 2015-09-15 DIAGNOSIS — E559 Vitamin D deficiency, unspecified: Secondary | ICD-10-CM

## 2015-09-15 DIAGNOSIS — F411 Generalized anxiety disorder: Secondary | ICD-10-CM

## 2015-09-15 DIAGNOSIS — Z23 Encounter for immunization: Secondary | ICD-10-CM

## 2015-09-15 DIAGNOSIS — F329 Major depressive disorder, single episode, unspecified: Secondary | ICD-10-CM

## 2015-09-15 DIAGNOSIS — M10071 Idiopathic gout, right ankle and foot: Secondary | ICD-10-CM

## 2015-09-15 DIAGNOSIS — F32A Depression, unspecified: Secondary | ICD-10-CM

## 2015-09-15 DIAGNOSIS — J4520 Mild intermittent asthma, uncomplicated: Secondary | ICD-10-CM

## 2015-09-15 LAB — BASIC METABOLIC PANEL
BUN: 17 mg/dL (ref 6–23)
CO2: 29 mEq/L (ref 19–32)
Calcium: 10.6 mg/dL — ABNORMAL HIGH (ref 8.4–10.5)
Chloride: 100 mEq/L (ref 96–112)
Creatinine, Ser: 0.95 mg/dL (ref 0.40–1.20)
GFR: 67.52 mL/min (ref >60.00)
Glucose, Bld: 94 mg/dL (ref 70–99)
Potassium: 3.4 mEq/L — ABNORMAL LOW (ref 3.5–5.1)
Sodium: 140 mEq/L (ref 135–145)

## 2015-09-15 LAB — IBC PANEL
Iron: 106 ug/dL (ref 42–145)
SATURATION RATIOS: 17.4 % — AB (ref 20.0–50.0)
Transferrin: 434 mg/dL — ABNORMAL HIGH (ref 212.0–360.0)

## 2015-09-15 LAB — CBC WITH DIFFERENTIAL/PLATELET
Basophils Absolute: 0 10*3/uL (ref 0.0–0.1)
Basophils Relative: 0.3 % (ref 0.0–3.0)
EOS PCT: 0.8 % (ref 0.0–5.0)
Eosinophils Absolute: 0.1 10*3/uL (ref 0.0–0.7)
HEMATOCRIT: 35.2 % — AB (ref 36.0–46.0)
Hemoglobin: 11.1 g/dL — ABNORMAL LOW (ref 12.0–15.0)
LYMPHS ABS: 2.7 10*3/uL (ref 0.7–4.0)
Lymphocytes Relative: 25.1 % (ref 12.0–46.0)
MCHC: 31.6 g/dL (ref 30.0–36.0)
MONO ABS: 0.8 10*3/uL (ref 0.1–1.0)
MONOS PCT: 7.2 % (ref 3.0–12.0)
NEUTROS ABS: 7.1 10*3/uL (ref 1.4–7.7)
NEUTROS PCT: 66.6 % (ref 43.0–77.0)
PLATELETS: 389 10*3/uL (ref 150.0–400.0)
RBC: 5.25 Mil/uL — ABNORMAL HIGH (ref 3.87–5.11)
RDW: 18.8 % — AB (ref 11.5–15.5)
WBC: 10.7 10*3/uL — ABNORMAL HIGH (ref 4.0–10.5)

## 2015-09-15 LAB — HEPATIC FUNCTION PANEL
ALT: 25 U/L (ref 0–35)
AST: 35 U/L (ref 0–37)
Albumin: 4.8 g/dL (ref 3.5–5.2)
Alkaline Phosphatase: 77 U/L (ref 39–117)
BILIRUBIN DIRECT: 0 mg/dL (ref 0.0–0.3)
BILIRUBIN TOTAL: 0.4 mg/dL (ref 0.2–1.2)
Total Protein: 8.4 g/dL — ABNORMAL HIGH (ref 6.0–8.3)

## 2015-09-15 LAB — URINALYSIS, ROUTINE W REFLEX MICROSCOPIC
Bilirubin Urine: NEGATIVE
KETONES UR: NEGATIVE
LEUKOCYTES UA: NEGATIVE
Nitrite: NEGATIVE
SPECIFIC GRAVITY, URINE: 1.015 (ref 1.000–1.030)
URINE GLUCOSE: NEGATIVE
UROBILINOGEN UA: 0.2 (ref 0.0–1.0)
pH: 7 (ref 5.0–8.0)

## 2015-09-15 LAB — TSH: TSH: 3.54 u[IU]/mL (ref 0.35–4.50)

## 2015-09-15 LAB — VITAMIN D 25 HYDROXY (VIT D DEFICIENCY, FRACTURES): VITD: 21.05 ng/mL — ABNORMAL LOW (ref 30.00–100.00)

## 2015-09-15 LAB — VITAMIN B12: Vitamin B-12: 883 pg/mL (ref 211–911)

## 2015-09-15 LAB — URIC ACID: URIC ACID, SERUM: 8.8 mg/dL — AB (ref 2.4–7.0)

## 2015-09-15 MED ORDER — ALLOPURINOL 100 MG PO TABS: 200.0000 mg | ORAL_TABLET | Freq: Every day | ORAL | Status: DC

## 2015-09-15 MED ORDER — DIAZEPAM 5 MG PO TABS: 5.0000 mg | ORAL_TABLET | Freq: Two times a day (BID) | ORAL | Status: DC | PRN

## 2015-09-15 MED ORDER — HYDROCHLOROTHIAZIDE 25 MG PO TABS: 25.0000 mg | ORAL_TABLET | Freq: Every day | ORAL | Status: DC

## 2015-09-15 MED ORDER — ERGOCALCIFEROL 1.25 MG (50000 UT) PO CAPS: 50000.0000 [IU] | ORAL_CAPSULE | ORAL | Status: DC

## 2015-09-15 MED ORDER — CITALOPRAM HYDROBROMIDE 10 MG PO TABS: 10.0000 mg | ORAL_TABLET | Freq: Every day | ORAL | Status: DC

## 2015-09-15 MED ORDER — OMEPRAZOLE-SODIUM BICARBONATE 40-1100 MG PO CAPS: 1.0000 | ORAL_CAPSULE | Freq: Two times a day (BID) | ORAL | Status: DC

## 2015-09-15 MED ORDER — VITAMIN D3 50 MCG (2000 UT) PO CAPS: 2000.0000 [IU] | ORAL_CAPSULE | Freq: Every day | ORAL | Status: DC

## 2015-09-15 MED ORDER — POTASSIUM CHLORIDE CRYS ER 20 MEQ PO TBCR: 20.0000 meq | EXTENDED_RELEASE_TABLET | Freq: Every day | ORAL | Status: DC

## 2015-09-15 NOTE — Assessment & Plan Note (Signed)
Allopurinol Labs 

## 2015-09-15 NOTE — Assessment & Plan Note (Signed)
Risks associated with treatment noncompliance were discussed. Compliance was encouraged. Re-start Vit D 

## 2015-09-15 NOTE — Progress Notes (Signed)
Subjective:  Patient ID: [name], female    DOB: Oct 15, 1970  Age: 45 y.o. MRN: [id number]  CC: No chief complaint on file.   HPI [name] presents for stress and depression. Celexa xas worked in the past. She is upset about not being able to find a job. C/o "pressure" w/issues - depressed...  Outpatient Prescriptions Prior to Visit  Medication Sig Dispense Refill  . allopurinol (ZYLOPRIM) 100 MG tablet Take 2 tablets (200 mg total) by mouth daily. 60 tablet 6  . aspirin-acetaminophen-caffeine (EXCEDRIN MIGRAINE) 250-250-65 MG per tablet Take 2 tablets by mouth every 6 (six) hours as needed for pain.     . diazepam (VALIUM) 5 MG tablet Take 1 tablet (5 mg total) by mouth every 12 (twelve) hours as needed. for anxiety 30 tablet 0  . hydrochlorothiazide (HYDRODIURIL) 25 MG tablet Take 1 tablet (25 mg total) by mouth daily. Patient needs office visit before any further refills 30 tablet 0  . meloxicam (MOBIC) 15 MG tablet Take 1 tablet (15 mg total) by mouth daily. 20 tablet 0  . Multiple Vitamin (MULTIVITAMIN WITH MINERALS) TABS Take 1 tablet by mouth daily.    Marland Kitchen omeprazole-sodium bicarbonate (ZEGERID) 40-1100 MG per capsule Take 1 capsule by mouth 2 (two) times daily. 30 capsule 11  . potassium chloride SA (K-DUR,KLOR-CON) 20 MEQ tablet Take 1 tablet (20 mEq total) by mouth daily. 30 tablet 11   No facility-administered medications prior to visit.    ROS Review of Systems  Constitutional: Negative for chills, activity change, appetite change, fatigue and unexpected weight change.  HENT: Negative for congestion, mouth sores and sinus pressure.   Eyes: Negative for visual disturbance.  Respiratory: Negative for cough and chest tightness.   Gastrointestinal: Negative for nausea and abdominal pain.  Genitourinary: Negative for frequency, difficulty urinating and vaginal pain.  Musculoskeletal: Negative for back pain and gait problem.  Skin: Negative for pallor and rash.    Neurological: Negative for dizziness, tremors, weakness, numbness and headaches.  Psychiatric/Behavioral: Positive for dysphoric mood and decreased concentration. Negative for suicidal ideas, confusion and sleep disturbance. The patient is nervous/anxious.     Objective:  BP 120/90 mmHg  Pulse 108  Ht  (1.727 m)  Wt 127 lb (57.607 kg)  BMI 19.31 kg/m2  SpO2 97%  BP Readings from Last 3 Encounters:  09/15/15 120/90  01/03/14 120/80  12/02/13 126/82    Wt Readings from Last 3 Encounters:  09/15/15 127 lb (57.607 kg)  01/03/14 132 lb (59.875 kg)  12/02/13 136 lb (61.689 kg)    Physical Exam  Constitutional: She appears well-developed. No distress.  HENT:  Head: Normocephalic.  Right Ear: External ear normal.  Left Ear: External ear normal.  Nose: Nose normal.  Mouth/Throat: Oropharynx is clear and moist.  Eyes: Conjunctivae are normal. Pupils are equal, round, and reactive to light. Right eye exhibits no discharge. Left eye exhibits no discharge.  Neck: Normal range of motion. Neck supple. No JVD present. No tracheal deviation present. No thyromegaly present.  Cardiovascular: Normal rate, regular rhythm and normal heart sounds.   Pulmonary/Chest: No stridor. No respiratory distress. She has no wheezes.  Abdominal: Soft. Bowel sounds are normal. She exhibits no distension and no mass. There is no tenderness. There is no rebound and no guarding.  Musculoskeletal: She exhibits no edema or tenderness.  Lymphadenopathy:    She has no cervical adenopathy.  Neurological: She displays normal reflexes. No cranial nerve deficit. She exhibits normal  muscle tone. Coordination normal.  Skin: No rash noted. No erythema.  Psychiatric: Her behavior is normal. Judgment and thought content normal.    Lab Results  Component Value Date   WBC 11.6* 02/16/2013   HGB 13.3 02/16/2013   HCT 39.4 02/16/2013   PLT 371 02/16/2013   GLUCOSE 91 02/16/2013   CHOL 223* 04/09/2009   TRIG 94.0  04/09/2009   HDL 38.40* 04/09/2009   LDLDIRECT 173.9 04/09/2009   ALT 12 02/16/2013   AST 25 02/16/2013   NA 140 02/16/2013   K 3.7 02/16/2013   CL 100 02/16/2013   CREATININE 0.81 02/16/2013   BUN 10 02/16/2013   CO2 25 02/16/2013   TSH 1.374 07/14/2010   INR 1.02 02/16/2013    Dg Foot Complete Right  12/02/2013  CLINICAL DATA:  Lateral foot pain EXAM: RIGHT FOOT COMPLETE - 3+ VIEW COMPARISON:  None. FINDINGS: There is no evidence of fracture or dislocation. There is a small plantar calcaneal spur. There is no evidence of arthropathy or other focal bone abnormality. Soft tissues are unremarkable. IMPRESSION: No acute osseous injury of the right foot. Electronically Signed   By: Elige KoHetal  Patel   On: 12/02/2013 21:19    Assessment & Plan:   There are no diagnoses linked to this encounter. I am having Ms. Babel maintain her multivitamin with minerals, aspirin-acetaminophen-caffeine, omeprazole-sodium bicarbonate, potassium chloride SA, meloxicam, allopurinol, diazepam, and hydrochlorothiazide.  No orders of the defined types were placed in this encounter.     Follow-up: No Follow-up on file.  Sonda PrimesAlex Plotnikov, MD

## 2015-09-15 NOTE — Progress Notes (Signed)
Pre visit review using our clinic review tool, if applicable. No additional management support is needed unless otherwise documented below in the visit note. 

## 2015-09-15 NOTE — Assessment & Plan Note (Signed)
Risks associated with treatment noncompliance were discussed. Compliance was encouraged. Restart B12  

## 2015-09-15 NOTE — Assessment & Plan Note (Signed)
On Zegerid 

## 2015-09-15 NOTE — Assessment & Plan Note (Signed)
Start Celexa

## 2015-09-15 NOTE — Assessment & Plan Note (Signed)
Diazepam prn  Potential benefits of a long term benzodiazepines  use as well as potential risks  and complications were explained to the patient and were aknowledged. 

## 2015-09-15 NOTE — Assessment & Plan Note (Signed)
Prevnar Flu  shot

## 2016-03-30 ENCOUNTER — Other Ambulatory Visit: Payer: Self-pay | Admitting: Internal Medicine

## 2016-03-30 NOTE — Telephone Encounter (Signed)
sch OV ols

## 2016-04-11 ENCOUNTER — Other Ambulatory Visit: Payer: Self-pay | Admitting: Internal Medicine

## 2016-04-12 NOTE — Telephone Encounter (Signed)
Needs OV.  

## 2016-04-13 NOTE — Telephone Encounter (Signed)
Done. Will have scheduler contact pt to schedule OV.

## 2016-04-20 ENCOUNTER — Telehealth: Payer: Self-pay | Admitting: *Deleted

## 2016-04-20 NOTE — Telephone Encounter (Signed)
-----   Message from Claiborne Riggustin R Pruitt sent at 04/18/2016 10:55 AM EDT ----- Regarding: RE: Needs OV # is disconnected  ----- Message ----- From: Merrilyn PumaStacey N Simmons, CMA Sent: 04/13/2016   8:57 AM To: Claiborne Riggustin R Pruitt Subject: Needs OV                                       Please call pt- she needs OV per Plot.  Thanks!! SS/CMA

## 2016-05-11 ENCOUNTER — Telehealth: Payer: Self-pay | Admitting: Internal Medicine

## 2016-05-11 NOTE — Telephone Encounter (Signed)
Sister called for patient stating that she is having ear issues like she had back in 2011.  State that Dr. Macario GoldsPlot had referred her to ENT and she was prescribed by the ENT an ear drop called Cipro HC OTIC suspension .  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  Would like to know if Dr. Macario GoldsPlot could [facility]  Patient is at work.  Can received calls at 3pm or 6pm on.

## 2016-05-13 ENCOUNTER — Other Ambulatory Visit: Payer: Self-pay | Admitting: Internal Medicine

## 2016-05-14 MED ORDER — CIPROFLOXACIN-HYDROCORTISONE 0.2-1 % OT SUSP: 3.0000 [drp] | Freq: Two times a day (BID) | OTIC | 0 refills | Status: DC

## 2016-05-14 NOTE — Telephone Encounter (Signed)
[facility] Thx

## 2016-05-16 MED ORDER — NEOMYCIN-POLYMYXIN-HC 3.5-10000-1 OT SOLN: 3.0000 [drp] | Freq: Four times a day (QID) | OTIC | 3 refills | Status: DC

## 2016-05-16 MED ORDER — NEOMYCIN-POLYMYXIN-HC 3.5-10000-1 OT SOLN: 3.0000 [drp] | Freq: Four times a day (QID) | OTIC | 0 refills | Status: AC

## 2016-05-16 NOTE — Telephone Encounter (Signed)
Pts sister called and stated the drops were going to be $300 which is too expensive. She wants to know if she can get amoxicillin instead. Please advise thanks.

## 2016-05-16 NOTE — Telephone Encounter (Signed)
OK generic cortisporin otic gtt Thx

## 2016-05-17 NOTE — Telephone Encounter (Signed)
Notified pt/sister new drop sent to pharmacy...Raechel Chute/lmb

## 2016-07-18 ENCOUNTER — Other Ambulatory Visit: Payer: Self-pay | Admitting: Internal Medicine

## 2016-07-20 NOTE — Telephone Encounter (Signed)
Called refill into pharmacy had to leave on pharmacy vm.../lmb 

## 2016-08-15 ENCOUNTER — Emergency Department (HOSPITAL_COMMUNITY)
Admission: EM | Admit: 2016-08-15 | Discharge: 2016-08-15 | Disposition: A | Discharge status: Home / Self Care | Payer: Managed Care, Other (non HMO) | Attending: Emergency Medicine | Admitting: Emergency Medicine

## 2016-08-15 ENCOUNTER — Encounter (HOSPITAL_COMMUNITY): Payer: Self-pay

## 2016-08-15 DIAGNOSIS — J45909 Unspecified asthma, uncomplicated: Secondary | ICD-10-CM | POA: Insufficient documentation

## 2016-08-15 DIAGNOSIS — Z79899 Other long term (current) drug therapy: Secondary | ICD-10-CM | POA: Insufficient documentation

## 2016-08-15 DIAGNOSIS — N3000 Acute cystitis without hematuria: Secondary | ICD-10-CM | POA: Diagnosis not present

## 2016-08-15 DIAGNOSIS — R300 Dysuria: Secondary | ICD-10-CM | POA: Diagnosis present

## 2016-08-15 LAB — URINALYSIS, ROUTINE W REFLEX MICROSCOPIC
BILIRUBIN URINE: NEGATIVE
GLUCOSE, UA: NEGATIVE mg/dL
KETONES UR: NEGATIVE mg/dL
NITRITE: POSITIVE — AB
PROTEIN: NEGATIVE mg/dL
Specific Gravity, Urine: 1.006 (ref 1.005–1.030)
pH: 7 (ref 5.0–8.0)

## 2016-08-15 LAB — PREGNANCY, URINE: Preg Test, Ur: NEGATIVE

## 2016-08-15 MED ORDER — CEPHALEXIN 500 MG PO CAPS: 500.0000 mg | ORAL_CAPSULE | Freq: Four times a day (QID) | ORAL | 0 refills | Status: DC

## 2016-08-15 MED ORDER — FLUCONAZOLE 150 MG PO TABS: 150.0000 mg | ORAL_TABLET | Freq: Once | ORAL | 0 refills | Status: AC

## 2016-08-15 MED ORDER — PHENAZOPYRIDINE HCL 200 MG PO TABS: 200.0000 mg | ORAL_TABLET | Freq: Three times a day (TID) | ORAL | 0 refills | Status: DC

## 2016-08-15 MED ORDER — ALBUTEROL SULFATE (2.5 MG/3ML) 0.083% IN NEBU
5.0000 mg | INHALATION_SOLUTION | Freq: Once | RESPIRATORY_TRACT | Status: AC
  Administered 2016-08-15: 5 mg via RESPIRATORY_TRACT
  Filled 2016-08-15: qty 6

## 2016-08-15 MED ORDER — DEXTROSE 5 % IV SOLN
1.0000 g | Freq: Once | INTRAVENOUS | Status: AC
  Administered 2016-08-15: 1 g via INTRAVENOUS
  Filled 2016-08-15: qty 10

## 2016-08-15 MED ORDER — SODIUM CHLORIDE 0.9 % IV BOLUS (SEPSIS)
1000.0000 mL | Freq: Once | INTRAVENOUS | Status: AC
  Administered 2016-08-15: 1000 mL via INTRAVENOUS

## 2016-08-15 NOTE — ED Provider Notes (Signed)
[facility]-EMERGENCY DEPT Provider Note   CSN: [id number] Arrival date & time: 08/15/16  0246     History   Chief Complaint Chief Complaint  Patient presents with  . Urinary Tract Infection    HPI [name] is a 46 y.o. female.  Patient presents with dysuria and urinary frequency for the past 3 days. She has been managing at home with AZO OTC but tonight started having chills and a fever with Tmax 101. No vomiting. She has had UTI's in the past and relates that these symptoms are typical. She did not take anything for fever at home.    The history is provided by the patient. No language interpreter was used.  Urinary Tract Infection   Associated symptoms include nausea and frequency. Pertinent negatives include no chills.    Past Medical History:  Diagnosis Date  . Allergic rhinitis   . Anxiety   . Asthma   . Congenital cystic disease of lung   . Constipation   . Depression   . GERD (gastroesophageal reflux disease)   . Headache, migraine   . History of DVT of lower extremity   . Onychomycosis   . Palpitations   . Personal history of PE (pulmonary embolism)   . Tinea versicolor     Patient Active Problem List   Diagnosis Date Noted  . Gout attack 01/03/2014  . Skin rash 05/14/2013  . Pityriasis rosea 05/14/2013  . BRONCHITIS 07/09/2010  . VENOUS INSUFFICIENCY, CHRONIC 12/04/2009  . TINEA CORPORIS 12/02/2009  . OTHER SPECIFIED DISEASE OF NAIL 12/02/2009  . OTITIS MEDIA 11/10/2009  . B12 deficiency 10/01/2009  . OTITIS EXTERNA 10/01/2009  . CERUMEN IMPACTION 10/01/2009  . Anxiety state 08/21/2009  . Vitamin D deficiency 02/09/2009  . HYPOKALEMIA 02/09/2009  . DIZZINESS 02/09/2009  . Other malaise and fatigue 02/09/2009  . MIGRAINE, COMMON W/INTRACTABLE MIGRAINE 02/06/2009  . VOMITING 02/06/2009  . EDEMA 12/24/2008  . UNSPECIFIED ANEMIA 12/12/2008  . Palpitations 12/12/2008  . UPPER RESPIRATORY INFECTION (URI) 11/18/2008  . SINUSITIS- ACUTE-NOS  09/26/2008  . ACUTE BRONCHOSPASM 09/26/2008  . Depression 09/09/2008  . ONYCHOMYCOSIS 05/26/2008  . MIGRAINE, CHRONIC 05/26/2008  . ALLERGIC RHINITIS 05/26/2008  . Asthma 05/26/2008  . GERD 05/26/2008  . HEMOPTYSIS 01/13/2007  . DVT, HX OF 01/13/2007    Past Surgical History:  Procedure Laterality Date  . LUNG LOBECTOMY     RL  . TUBAL LIGATION      OB History    No data available       Home Medications    Prior to Admission medications   Medication Sig Start Date End Date Taking? Authorizing Provider  metFORMIN (GLUCOPHAGE) 500 MG tablet Take 500 mg by mouth daily with breakfast.   Yes Historical Provider, MD  allopurinol (ZYLOPRIM) 100 MG tablet Take 2 tablets (200 mg total) by mouth daily. Patient not taking: Reported on 08/15/2016 09/15/15   Tresa GarterAleksei Plotnikov V, MD  Cholecalciferol (VITAMIN D3) 2000 units capsule Take 1 capsule (2,000 Units total) by mouth daily. Patient not taking: Reported on 08/15/2016 09/15/15   Macarthur CritchleyAleksei Plotnikov V, MD  citalopram (CELEXA) 10 MG tablet Take 1 tablet (10 mg total) by mouth daily. 09/15/15   Aleksei Plotnikov V, MD  diazepam (VALIUM) 5 MG tablet take 1 tablet by mouth every 12 hours if needed for anxiety 07/19/16   Aleksei Plotnikov V, MD  ergocalciferol (VITAMIN D2) 50000 units capsule Take 1 capsule (50,000 Units total) by mouth once a week. 09/15/15   Macarthur CritchleyAleksei  Plotnikov V, MD  hydrochlorothiazide (HYDRODIURIL) 25 MG tablet Take 1 tablet (25 mg total) by mouth daily. Patient needs office visit before any further refills 09/15/15   Tresa Garter, MD  neomycin-polymyxin-hydrocortisone (CORTISPORIN) otic solution Place 3 drops into both ears 4 (four) times daily. 05/16/16 08/24/16  Aleksei Plotnikov V, MD  omeprazole-sodium bicarbonate (ZEGERID) 40-1100 MG capsule Take 1 capsule by mouth 2 (two) times daily. 09/15/15   Aleksei Plotnikov V, MD  potassium chloride SA (K-DUR,KLOR-CON) 20 MEQ tablet Take 1 tablet (20 mEq total) by mouth daily.  09/15/15   Tresa Garter, MD    Family History Family History  Problem Relation Age of Onset  . Hypertension Other   . Diabetes Other     Social History Social History  Substance Use Topics  . Smoking status: Never Smoker  . Smokeless tobacco: Never Used  . Alcohol use Yes     Comment: seldom     Allergies   Guaifenesin; Iron; Amoxapine and related; Codeine; Doxepin; Hydromorphone hcl; Metoprolol succinate; Morphine; Pantoprazole sodium; Pseudoeph-doxylamine-dm-apap; Pseudoephedrine; Sulfonamide derivatives; Tetracycline; and Prednisone   Review of Systems Review of Systems  Constitutional: Negative for chills and fever.  HENT: Negative.   Respiratory: Positive for cough.   Cardiovascular: Negative.   Gastrointestinal: Positive for nausea.  Genitourinary: Positive for dysuria and frequency.  Musculoskeletal: Negative.   Skin: Negative.   Neurological: Negative.      Physical Exam Updated Vital Signs BP (!) 144/105 (BP Location: Right Arm)   Pulse 114   Temp 98.6 F (37 C) (Oral)   Resp 18   Ht 5\' 7"  (1.702 m)   Wt 57.6 kg   SpO2 100%   BMI 19.89 kg/m   Physical Exam  Constitutional: She is oriented to person, place, and time. She appears well-developed and well-nourished. No distress.  HENT:  Head: Normocephalic.  Neck: Normal range of motion. Neck supple.  Cardiovascular: Normal rate and regular rhythm.   Pulmonary/Chest: Effort normal and breath sounds normal. She has no wheezes. She has no rales.  Abdominal: Soft. Bowel sounds are normal. There is no tenderness. There is no rebound and no guarding.  Musculoskeletal: Normal range of motion.  Neurological: She is alert and oriented to person, place, and time.  Skin: Skin is warm and dry. No rash noted.  Psychiatric: She has a normal mood and affect.     ED Treatments / Results  Labs (all labs ordered are listed, but only abnormal results are displayed) Labs Reviewed  URINALYSIS, ROUTINE W  REFLEX MICROSCOPIC - Abnormal; Notable for the following:       Result Value   Color, Urine AMBER (*)    Hgb urine dipstick MODERATE (*)    Nitrite POSITIVE (*)    Leukocytes, UA SMALL (*)    Bacteria, UA RARE (*)    Squamous Epithelial / LPF 0-5 (*)    All other components within normal limits  PREGNANCY, URINE    EKG  EKG Interpretation None       Radiology No results found.  Procedures Procedures (including critical care time)  Medications Ordered in ED Medications  sodium chloride 0.9 % bolus 1,000 mL (not administered)  cefTRIAXone (ROCEPHIN) 1 g in dextrose 5 % 50 mL IVPB (not administered)  albuterol (PROVENTIL) (2.5 MG/3ML) 0.083% nebulizer solution 5 mg (not administered)     Initial Impression / Assessment and Plan / ED Course  I have reviewed the triage vital signs and the nursing notes.  Pertinent  labs & imaging results that were available during my care of the patient were reviewed by me and considered in my medical decision making (see chart for details).     Patient with 3-day history of urinary symptoms with confirmed UTI on lab studies here. Patient provided 1L fluids with improvement in symptoms. No vomiting. IV Rocephin provided.   Patient developed a mild cough while in the ED. No SOB, CP, wheezing. At the request of the patient's sister, she was given a nebulizer treatment and cough subsides.   She is felt stable for discharge home. Feeling better.   Final Clinical Impressions(s) / ED Diagnoses   Final diagnoses:  None   1. UTI  New Prescriptions New Prescriptions   No medications on file     Elpidio Anis, PA-C 08/15/16 2242    Derwood Kaplan, MD 08/15/16 [phone]

## 2016-08-15 NOTE — ED Notes (Signed)
Pt ambulated to restroom without difficulty

## 2016-08-15 NOTE — ED Triage Notes (Signed)
Dysuria for about 2 days states burning with urination and took AZO for symptoms.  No other symptoms voiced.

## 2016-08-15 NOTE — ED Notes (Addendum)
Pt sts would like to be checked for flu due to fever at home earlier tonight (101.6). PA notified.

## 2016-08-29 ENCOUNTER — Telehealth: Payer: Self-pay | Admitting: Internal Medicine

## 2016-08-29 MED ORDER — CIPROFLOXACIN HCL 250 MG PO TABS: 250.0000 mg | ORAL_TABLET | Freq: Two times a day (BID) | ORAL | 0 refills | Status: DC
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
## 2016-10-07 ENCOUNTER — Other Ambulatory Visit: Payer: Self-pay | Admitting: Internal Medicine

## 2016-10-14 ENCOUNTER — Ambulatory Visit (INDEPENDENT_AMBULATORY_CARE_PROVIDER_SITE_OTHER): Payer: Managed Care, Other (non HMO) | Admitting: Internal Medicine

## 2016-10-14 ENCOUNTER — Encounter: Payer: Self-pay | Admitting: Internal Medicine

## 2016-10-14 ENCOUNTER — Other Ambulatory Visit (INDEPENDENT_AMBULATORY_CARE_PROVIDER_SITE_OTHER): Payer: Managed Care, Other (non HMO)

## 2016-10-14 DIAGNOSIS — K0510 Chronic gingivitis, plaque induced: Secondary | ICD-10-CM | POA: Diagnosis not present

## 2016-10-14 DIAGNOSIS — E559 Vitamin D deficiency, unspecified: Secondary | ICD-10-CM | POA: Diagnosis not present

## 2016-10-14 DIAGNOSIS — J4520 Mild intermittent asthma, uncomplicated: Secondary | ICD-10-CM | POA: Diagnosis not present

## 2016-10-14 DIAGNOSIS — F411 Generalized anxiety disorder: Secondary | ICD-10-CM

## 2016-10-14 DIAGNOSIS — E538 Deficiency of other specified B group vitamins: Secondary | ICD-10-CM

## 2016-10-14 DIAGNOSIS — M10071 Idiopathic gout, right ankle and foot: Secondary | ICD-10-CM | POA: Diagnosis not present

## 2016-10-14 LAB — URINALYSIS
Bilirubin Urine: NEGATIVE
HGB URINE DIPSTICK: NEGATIVE
Ketones, ur: NEGATIVE
Leukocytes, UA: NEGATIVE
Nitrite: NEGATIVE
PH: 7 (ref 5.0–8.0)
Specific Gravity, Urine: 1.01 (ref 1.000–1.030)
URINE GLUCOSE: NEGATIVE
Urobilinogen, UA: 0.2 (ref 0.0–1.0)

## 2016-10-14 LAB — HEPATIC FUNCTION PANEL
ALT: 29 U/L (ref 0–35)
AST: 29 U/L (ref 0–37)
Albumin: 4.9 g/dL (ref 3.5–5.2)
Alkaline Phosphatase: 68 U/L (ref 39–117)
BILIRUBIN DIRECT: 0 mg/dL (ref 0.0–0.3)
TOTAL PROTEIN: 8.4 g/dL — AB (ref 6.0–8.3)
Total Bilirubin: 0.5 mg/dL (ref 0.2–1.2)

## 2016-10-14 LAB — BASIC METABOLIC PANEL
BUN: 12 mg/dL (ref 6–23)
CHLORIDE: 97 meq/L (ref 96–112)
CO2: 33 meq/L — AB (ref 19–32)
CREATININE: 1.04 mg/dL (ref 0.40–1.20)
Calcium: 10.5 mg/dL (ref 8.4–10.5)
GFR: 60.53 mL/min (ref >60.00)
Glucose, Bld: 85 mg/dL (ref 70–99)
Potassium: 3.2 mEq/L — ABNORMAL LOW (ref 3.5–5.1)
Sodium: 141 mEq/L (ref 135–145)

## 2016-10-14 LAB — CBC WITH DIFFERENTIAL/PLATELET
BASOS ABS: 0.1 10*3/uL (ref 0.0–0.1)
BASOS PCT: 0.6 % (ref 0.0–3.0)
EOS PCT: 2.9 % (ref 0.0–5.0)
Eosinophils Absolute: 0.3 10*3/uL (ref 0.0–0.7)
HCT: 45 % (ref 36.0–46.0)
Hemoglobin: 15.6 g/dL — ABNORMAL HIGH (ref 12.0–15.0)
LYMPHS ABS: 2.5 10*3/uL (ref 0.7–4.0)
Lymphocytes Relative: 28.3 % (ref 12.0–46.0)
MCHC: 34.7 g/dL (ref 30.0–36.0)
MCV: 86.6 fl (ref 78.0–100.0)
Monocytes Absolute: 0.7 10*3/uL (ref 0.1–1.0)
Monocytes Relative: 7.3 % (ref 3.0–12.0)
NEUTROS ABS: 5.5 10*3/uL (ref 1.4–7.7)
NEUTROS PCT: 60.9 % (ref 43.0–77.0)
PLATELETS: 366 10*3/uL (ref 150.0–400.0)
RBC: 5.19 Mil/uL — ABNORMAL HIGH (ref 3.87–5.11)
RDW: 12.7 % (ref 11.5–15.5)
WBC: 9 10*3/uL (ref 4.0–10.5)

## 2016-10-14 LAB — URIC ACID: Uric Acid, Serum: 9.4 mg/dL — ABNORMAL HIGH (ref 2.4–7.0)

## 2016-10-14 LAB — VITAMIN B12: Vitamin B-12: 1337 pg/mL — ABNORMAL HIGH (ref 211–911)

## 2016-10-14 MED ORDER — METRONIDAZOLE 500 MG PO TABS: 500.0000 mg | ORAL_TABLET | Freq: Three times a day (TID) | ORAL | 0 refills | Status: DC

## 2016-10-14 MED ORDER — HYDROCHLOROTHIAZIDE 25 MG PO TABS: 25.0000 mg | ORAL_TABLET | Freq: Every day | ORAL | 11 refills | Status: DC

## 2016-10-14 MED ORDER — POTASSIUM CHLORIDE CRYS ER 20 MEQ PO TBCR: 20.0000 meq | EXTENDED_RELEASE_TABLET | Freq: Every day | ORAL | 11 refills | Status: DC

## 2016-10-14 MED ORDER — CIPROFLOXACIN HCL 250 MG PO TABS: 250.0000 mg | ORAL_TABLET | Freq: Two times a day (BID) | ORAL | 0 refills | Status: DC

## 2016-10-14 MED ORDER — ALLOPURINOL 100 MG PO TABS: 200.0000 mg | ORAL_TABLET | Freq: Every day | ORAL | 11 refills | Status: DC

## 2016-10-14 NOTE — Assessment & Plan Note (Signed)
Discussed oral hygene

## 2016-10-14 NOTE — Assessment & Plan Note (Signed)
On B12 

## 2016-10-14 NOTE — Assessment & Plan Note (Signed)
On Vit D 

## 2016-10-14 NOTE — Assessment & Plan Note (Signed)
No relapse Allopurinol 

## 2016-10-14 NOTE — Assessment & Plan Note (Signed)
Doing well lately 

## 2016-10-14 NOTE — Progress Notes (Signed)
Pre visit review using our clinic review tool, if applicable. No additional management support is needed unless otherwise documented below in the visit note. 

## 2016-10-14 NOTE — Progress Notes (Signed)
Subjective:  Patient ID: [name], female    DOB: [birth year]  Age: 46 y.o. MRN: [id number]  CC: No chief complaint on file.   HPI Meegan D Salmons presents for stress, anxiety, B12 def, UTIs f/u  Outpatient Medications Prior to Visit  Medication Sig Dispense Refill  . cephALEXin (KEFLEX) 500 MG capsule Take 1 capsule (500 mg total) by mouth 4 (four) times daily. 20 capsule 0  . Cholecalciferol (VITAMIN D3) 1000 units CAPS Take 1,000 Units by mouth daily.    . ciprofloxacin (CIPRO) 250 MG tablet Take 1 tablet (250 mg total) by mouth 2 (two) times daily. 10 tablet 0  . diazepam (VALIUM) 5 MG tablet take 1 tablet by mouth every 12 hours if needed for anxiety (Patient taking differently: Take 5 mg by mouth every 12 hours as needed for anxiety) 30 tablet 1  . ergocalciferol (VITAMIN D2) 50000 units capsule Take 1 capsule (50,000 Units total) by mouth once a week. 6 capsule 0  . ferrous sulfate 325 (65 FE) MG EC tablet Take 325 mg by mouth daily.    . hydrochlorothiazide (HYDRODIURIL) 25 MG tablet Take 1 tablet (25 mg total) by mouth daily. Patient needs office visit before any further refills 30 tablet 11  . Multiple Vitamins-Minerals (MULTIVITAMIN ADULTS PO) Take 1 tablet by mouth daily.    Marland Kitchen omeprazole (PRILOSEC) 20 MG capsule Take 80 mg by mouth daily at 12 noon.    . phenazopyridine (PYRIDIUM) 200 MG tablet Take 1 tablet (200 mg total) by mouth 3 (three) times daily. 6 tablet 0  . potassium chloride SA (K-DUR,KLOR-CON) 20 MEQ tablet Take 1 tablet (20 mEq total) by mouth daily. 30 tablet 11  . vitamin B-12 (CYANOCOBALAMIN) 1000 MCG tablet Take 1,000 mcg by mouth daily.    Marland Kitchen allopurinol (ZYLOPRIM) 100 MG tablet Take 2 tablets (200 mg total) by mouth daily. (Patient not taking: Reported on 08/15/2016) 60 tablet 11  . Cholecalciferol (VITAMIN D3) 2000 units capsule Take 1 capsule (2,000 Units total) by mouth daily. (Patient not taking: Reported on 08/15/2016) 100 capsule 3  . citalopram  (CELEXA) 10 MG tablet Take 1 tablet (10 mg total) by mouth daily. (Patient not taking: Reported on 08/15/2016) 30 tablet 5  . omeprazole-sodium bicarbonate (ZEGERID) 40-1100 MG capsule Take 1 capsule by mouth 2 (two) times daily. (Patient not taking: Reported on 08/15/2016) 60 capsule 11   No facility-administered medications prior to visit.     ROS Review of Systems  Constitutional: Positive for fatigue. Negative for activity change, appetite change, chills and unexpected weight change.  HENT: Negative for congestion, mouth sores and sinus pressure.   Eyes: Negative for visual disturbance.  Respiratory: Negative for cough and chest tightness.   Gastrointestinal: Negative for abdominal pain and nausea.  Genitourinary: Negative for difficulty urinating, frequency and vaginal pain.  Musculoskeletal: Negative for back pain and gait problem.  Skin: Negative for pallor and rash.  Neurological: Negative for dizziness, tremors, weakness, numbness and headaches.  Psychiatric/Behavioral: Negative for confusion and sleep disturbance. The patient is nervous/anxious.     Objective:  BP 130/84 (BP Location: Left Arm, Patient Position: Sitting, Cuff Size: Large)   Pulse 91   Temp 97.5 F (36.4 C) (Oral)   Ht  (1.702 m)   Wt 125 lb 1.3 oz (56.7 kg)   SpO2 98%   BMI 19.59 kg/m   BP Readings from Last 3 Encounters:  10/14/16 130/84  08/15/16 134/78  09/15/15 120/90  Wt Readings from Last 3 Encounters:  10/14/16 125 lb 1.3 oz (56.7 kg)  08/15/16 127 lb (57.6 kg)  09/15/15 127 lb (57.6 kg)    Physical Exam  Constitutional: She appears well-developed. No distress.  HENT:  Head: Normocephalic.  Right Ear: External ear normal.  Left Ear: External ear normal.  Nose: Nose normal.  Mouth/Throat: Oropharynx is clear and moist.  Eyes: Conjunctivae are normal. Pupils are equal, round, and reactive to light. Right eye exhibits no discharge. Left eye exhibits no discharge.  Neck: Normal  range of motion. Neck supple. No JVD present. No tracheal deviation present. No thyromegaly present.  Cardiovascular: Normal rate, regular rhythm and normal heart sounds.   Pulmonary/Chest: No stridor. No respiratory distress. She has no wheezes.  Abdominal: Soft. Bowel sounds are normal. She exhibits no distension and no mass. There is no tenderness. There is no rebound and no guarding.  Musculoskeletal: She exhibits no edema or tenderness.  Lymphadenopathy:    She has no cervical adenopathy.  Neurological: She displays normal reflexes. No cranial nerve deficit. She exhibits normal muscle tone. Coordination normal.  Skin: No rash noted. No erythema.  Psychiatric: Her behavior is normal. Judgment and thought content normal.  gingivitis, caries  Lab Results  Component Value Date   WBC 10.7 (H) 09/15/2015   HGB 11.1 (L) 09/15/2015   HCT 35.2 (L) 09/15/2015   PLT 389.0 09/15/2015   GLUCOSE 94 09/15/2015   CHOL 223 (H) 04/09/2009   TRIG 94.0 04/09/2009   HDL 38.40 (L) 04/09/2009   LDLDIRECT 173.9 04/09/2009   ALT 25 09/15/2015   AST 35 09/15/2015   NA 140 09/15/2015   K 3.4 (L) 09/15/2015   CL 100 09/15/2015   CREATININE 0.95 09/15/2015   BUN 17 09/15/2015   CO2 29 09/15/2015   TSH 3.54 09/15/2015   INR 1.02 02/16/2013    No results found.  Assessment & Plan:   There are no diagnoses linked to this encounter. I have discontinued Ms. Kim's omeprazole-sodium bicarbonate and citalopram. I am also having her maintain her potassium chloride SA, hydrochlorothiazide, allopurinol, ergocalciferol, diazepam, omeprazole, Vitamin D3, ferrous sulfate, vitamin B-12, Multiple Vitamins-Minerals (MULTIVITAMIN ADULTS PO), cephALEXin, phenazopyridine, and ciprofloxacin.  No orders of the defined types were placed in this encounter.    Follow-up: No Follow-up on file.  Sonda Primes, MD

## 2016-10-14 NOTE — Assessment & Plan Note (Signed)
Diazepam prn 

## 2016-11-15 ENCOUNTER — Other Ambulatory Visit: Payer: Self-pay | Admitting: Internal Medicine

## 2017-02-06 ENCOUNTER — Other Ambulatory Visit: Payer: Self-pay | Admitting: Internal Medicine

## 2017-02-07 NOTE — Telephone Encounter (Signed)
Check [place] [facility] last filled 11/01/2016. Pls advise on refill...Raechel Chute

## 2017-02-08 NOTE — Telephone Encounter (Signed)
Called refill into pharmacy left on pharmacy vm.../lmb 

## 2017-06-20 DIAGNOSIS — J189 Pneumonia, unspecified organism: Secondary | ICD-10-CM

## 2017-06-20 HISTORY — DX: Pneumonia, unspecified organism: J18.9

## 2017-06-28 ENCOUNTER — Telehealth: Payer: Self-pay | Admitting: Internal Medicine

## 2017-06-28 NOTE — Telephone Encounter (Signed)
Copied from CRM [id number]. Topic: Inquiry >> Jun 28, 2017  4:59 PM Alexander BergeronBarksdale, Harvey B wrote: Reason for CRM: pt had to renew insurance; and the insurance provider states in order for the insurance to pay the Rx's they have to be made into a 90 day supply, contact pt to advise

## 2017-06-29 ENCOUNTER — Ambulatory Visit (INDEPENDENT_AMBULATORY_CARE_PROVIDER_SITE_OTHER): Payer: Managed Care, Other (non HMO) | Admitting: Family

## 2017-06-29 ENCOUNTER — Ambulatory Visit (INDEPENDENT_AMBULATORY_CARE_PROVIDER_SITE_OTHER)
Admission: RE | Admit: 2017-06-29 | Discharge: 2017-06-29 | Disposition: A | Discharge status: Home / Self Care | Payer: Managed Care, Other (non HMO) | Source: Ambulatory Visit | Attending: Family | Admitting: Family

## 2017-06-29 ENCOUNTER — Other Ambulatory Visit (INDEPENDENT_AMBULATORY_CARE_PROVIDER_SITE_OTHER): Payer: Managed Care, Other (non HMO)

## 2017-06-29 ENCOUNTER — Encounter: Payer: Self-pay | Admitting: Family

## 2017-06-29 VITALS — BP 136/74 | HR 82 | Temp 98.2°F | Ht 67.0 in | Wt 123.0 lb

## 2017-06-29 DIAGNOSIS — K2100 Gastro-esophageal reflux disease with esophagitis, without bleeding: Secondary | ICD-10-CM

## 2017-06-29 DIAGNOSIS — I10 Essential (primary) hypertension: Secondary | ICD-10-CM | POA: Diagnosis not present

## 2017-06-29 DIAGNOSIS — R042 Hemoptysis: Secondary | ICD-10-CM

## 2017-06-29 DIAGNOSIS — M1000 Idiopathic gout, unspecified site: Secondary | ICD-10-CM

## 2017-06-29 DIAGNOSIS — J439 Emphysema, unspecified: Secondary | ICD-10-CM

## 2017-06-29 LAB — CBC WITH DIFFERENTIAL/PLATELET
Basophils Absolute: 0.1 10*3/uL (ref 0.0–0.1)
Basophils Relative: 0.6 % (ref 0.0–3.0)
EOS ABS: 0.1 10*3/uL (ref 0.0–0.7)
Eosinophils Relative: 1.1 % (ref 0.0–5.0)
HCT: 47.1 % — ABNORMAL HIGH (ref 36.0–46.0)
HEMOGLOBIN: 15.8 g/dL — AB (ref 12.0–15.0)
Lymphocytes Relative: 25.4 % (ref 12.0–46.0)
Lymphs Abs: 2.6 10*3/uL (ref 0.7–4.0)
MCHC: 33.5 g/dL (ref 30.0–36.0)
MCV: 88.6 fl (ref 78.0–100.0)
MONO ABS: 0.8 10*3/uL (ref 0.1–1.0)
Monocytes Relative: 7.3 % (ref 3.0–12.0)
Neutro Abs: 6.8 10*3/uL (ref 1.4–7.7)
Neutrophils Relative %: 65.6 % (ref 43.0–77.0)
Platelets: 319 10*3/uL (ref 150.0–400.0)
RBC: 5.31 Mil/uL — AB (ref 3.87–5.11)
RDW: 13 % (ref 11.5–15.5)
WBC: 10.3 10*3/uL (ref 4.0–10.5)

## 2017-06-29 LAB — COMPREHENSIVE METABOLIC PANEL
ALBUMIN: 4.9 g/dL (ref 3.5–5.2)
ALK PHOS: 57 U/L (ref 39–117)
ALT: 19 U/L (ref 0–35)
AST: 23 U/L (ref 0–37)
BUN: 18 mg/dL (ref 6–23)
CALCIUM: 10 mg/dL (ref 8.4–10.5)
CO2: 27 mEq/L (ref 19–32)
CREATININE: 0.77 mg/dL (ref 0.40–1.20)
Chloride: 101 mEq/L (ref 96–112)
GFR: 85.37 mL/min (ref >60.00)
Glucose, Bld: 92 mg/dL (ref 70–99)
POTASSIUM: 3.8 meq/L (ref 3.5–5.1)
SODIUM: 138 meq/L (ref 135–145)
TOTAL PROTEIN: 8.3 g/dL (ref 6.0–8.3)
Total Bilirubin: 0.6 mg/dL (ref 0.2–1.2)

## 2017-06-29 LAB — URIC ACID: Uric Acid, Serum: 9 mg/dL — ABNORMAL HIGH (ref 2.4–7.0)

## 2017-06-29 MED ORDER — POTASSIUM CHLORIDE CRYS ER 20 MEQ PO TBCR: 20.0000 meq | EXTENDED_RELEASE_TABLET | Freq: Every day | ORAL | 0 refills | Status: DC

## 2017-06-29 MED ORDER — ALLOPURINOL 100 MG PO TABS: 200.0000 mg | ORAL_TABLET | Freq: Every day | ORAL | 0 refills | Status: DC

## 2017-06-29 MED ORDER — HYDROCHLOROTHIAZIDE 25 MG PO TABS: 25.0000 mg | ORAL_TABLET | Freq: Every day | ORAL | 0 refills | Status: DC

## 2017-06-29 NOTE — Patient Instructions (Signed)
Please schedule a follow-up with your GI and Dr. Posey ReaPlotnikov

## 2017-06-29 NOTE — Telephone Encounter (Signed)
L/M for patient to call back to schedule an appointment

## 2017-06-29 NOTE — Telephone Encounter (Signed)
Pt needs OV to get any medications, please schedule appt

## 2017-06-29 NOTE — Progress Notes (Signed)
[name] is a 47 y.o. female with the following history as recorded in [employer]:  Patient Active Problem List   Diagnosis Date Noted  . Essential hypertension 06/29/2017  . Gingivitis 10/14/2016  . Gout attack 01/03/2014  . Skin rash 05/14/2013  . Pityriasis rosea 05/14/2013  . BRONCHITIS 07/09/2010  . VENOUS INSUFFICIENCY, CHRONIC 12/04/2009  . TINEA CORPORIS 12/02/2009  . OTHER SPECIFIED DISEASE OF NAIL 12/02/2009  . OTITIS MEDIA 11/10/2009  . B12 deficiency 10/01/2009  . OTITIS EXTERNA 10/01/2009  . CERUMEN IMPACTION 10/01/2009  . Anxiety state 08/21/2009  . Vitamin D deficiency 02/09/2009  . HYPOKALEMIA 02/09/2009  . DIZZINESS 02/09/2009  . Other malaise and fatigue 02/09/2009  . MIGRAINE, COMMON W/INTRACTABLE MIGRAINE 02/06/2009  . VOMITING 02/06/2009  . EDEMA 12/24/2008  . UNSPECIFIED ANEMIA 12/12/2008  . Palpitations 12/12/2008  . UPPER RESPIRATORY INFECTION (URI) 11/18/2008  . SINUSITIS- ACUTE-NOS 09/26/2008  . ACUTE BRONCHOSPASM 09/26/2008  . Depression 09/09/2008  . ONYCHOMYCOSIS 05/26/2008  . MIGRAINE, CHRONIC 05/26/2008  . ALLERGIC RHINITIS 05/26/2008  . Asthma 05/26/2008  . GERD 05/26/2008  . HEMOPTYSIS 01/13/2007  . DVT, HX OF 01/13/2007    Current Outpatient Medications  Medication Sig Dispense Refill  . allopurinol (ZYLOPRIM) 100 MG tablet Take 2 tablets (200 mg total) by mouth daily. 180 tablet 0  . Cholecalciferol (VITAMIN D3) 1000 units CAPS Take 1,000 Units by mouth daily.    . diazepam (VALIUM) 5 MG tablet take 1 tablet by mouth every 12 hours if needed for anxiety 30 tablet 1  . ferrous sulfate 325 (65 FE) MG EC tablet Take 325 mg by mouth daily.    . hydrochlorothiazide (HYDRODIURIL) 25 MG tablet Take 1 tablet (25 mg total) by mouth daily. Patient needs office visit before any further refills 90 tablet 0  . Multiple Vitamins-Minerals (MULTIVITAMIN ADULTS PO) Take 1 tablet by mouth daily.    Marland Kitchen omeprazole-sodium bicarbonate (ZEGERID) 40-1100  MG capsule Take 1 capsule by mouth daily before breakfast.    . phenazopyridine (PYRIDIUM) 200 MG tablet Take 1 tablet (200 mg total) by mouth 3 (three) times daily. 6 tablet 0  . potassium chloride SA (K-DUR,KLOR-CON) 20 MEQ tablet Take 1 tablet (20 mEq total) by mouth daily. 90 tablet 0  . vitamin B-12 (CYANOCOBALAMIN) 1000 MCG tablet Take 1,000 mcg by mouth daily.     No current facility-administered medications for this visit.     Allergies: Guaifenesin; Iron; Amoxapine and related; Codeine; Doxepin; Hydromorphone hcl; Metoprolol succinate; Morphine; Pantoprazole sodium; Pseudoeph-doxylamine-dm-apap; Pseudoephedrine; Sulfonamide derivatives; Tetracycline; and Prednisone  Past Medical History:  Diagnosis Date  . Allergic rhinitis   . Anxiety   . Asthma   . Congenital cystic disease of lung   . Constipation   . Depression   . GERD (gastroesophageal reflux disease)   . Headache, migraine   . History of DVT of lower extremity   . Onychomycosis   . Palpitations   . Personal history of PE (pulmonary embolism)   . Tinea versicolor     Past Surgical History:  Procedure Laterality Date  . LUNG LOBECTOMY     RL  . TUBAL LIGATION      Family History  Problem Relation Age of Onset  . Hypertension Other   . Diabetes Other     Social History   Tobacco Use  . Smoking status: Never Smoker  . Smokeless tobacco: Never Used  Substance Use Topics  . Alcohol use: Yes    Comment: seldom  Subjective:  Patient presents with concerns for bronchitis; started Tuesday am with "unusual cough" and coughed up "pure blood." Notes that she has had this happen in the past- chronic lung issues; has had part of right lobe removed and then later part of left lobe removed- cystic lesions; not currently under the care of pulmonology at this time; last saw a pulmonology at [facility]- Dr. Sandy Salaam- thinks this was at least 5-7 years ago; would like to be referred to local pulmonology/ cannot go back to [facility]ul[facility]; Also requesting refills on her gout medication, blood pressure medication and potassium until she can get into see her PCP; only has about 1 week of medication left; must have 90 day supplies per her insurance;    Objective:  Vitals:   06/29/17 1518  BP: 136/74  Pulse: 82  Temp: 98.2 F (36.8 C)  TempSrc: Oral  SpO2: 98%  Weight: 123 lb (55.8 kg)  Height: 5\' 7"  (1.702 m)    General: Well developed, well nourished, in no acute distress  Skin : Warm and dry.  Head: Normocephalic and atraumatic  Eyes: Sclera and conjunctiva clear; pupils round and reactive to light; extraocular movements intact  Ears: External normal; canals clear; tympanic membranes normal  Oropharynx: Pink, supple. No suspicious lesions  Neck: Supple without thyromegaly, adenopathy  Lungs: Respirations unlabored; clear to auscultation bilaterally without wheeze, rales, rhonchi  CVS exam: normal rate and regular rhythm.   Neurologic: Alert and oriented; speech intact; face symmetrical; moves all extremities well; CNII-XII intact without focal deficit   Assessment:  1. Hemoptysis   2. Pulmonary emphysema, unspecified emphysema type (HCC)   3. Idiopathic gout, unspecified chronicity, unspecified site   4. Essential hypertension   5. Gastroesophageal reflux disease with esophagitis     Plan:  1. Update CXR today; check CBC, CMP; do not feel antibiotic warranted today; will refer to pulmonology; 2. Refer to pulmonology; encouraged to establish and continue regular follow-up now that insurance back in place; 3. Check uric acid level today; refill given on Allopurinol; 4. Refill on HCTZ and potassium; patient understands to schedule follow-up with her PCP for continued refills; encouraged office visit every 6 months for follow-up of hypertension. 5. Patient has history of Barretts and has not seen her GI in follow-up; now that insurance back in place, she is encouraged and agrees to schedule  follow-up.  No Follow-up on file.  Orders Placed This Encounter  Procedures  . DG Chest 2 View    Standing Status:   Future    Standing Expiration Date:   08/28/2018    Order Specific Question:   Reason for Exam (SYMPTOM  OR DIAGNOSIS REQUIRED)    Answer:   hemoptysis    Order Specific Question:   Is patient pregnant?    Answer:   No    Order Specific Question:   Preferred imaging location?    Answer:   [facility]    Order Specific Question:   Radiology Contrast Protocol - do NOT remove file path    Answer:   \\[email or web address].pdf  . CBC w/Diff    Standing Status:   Future    Number of Occurrences:   1    Standing Expiration Date:   06/29/2018  . Comprehensive metabolic panel    Standing Status:   Future    Number of Occurrences:   1    Standing Expiration Date:   06/29/2018  . Uric acid    Standing Status:   Future  Number of Occurrences:   1    Standing Expiration Date:   06/29/2018  . Ambulatory referral to Pulmonology    Referral Priority:   Routine    Referral Type:   Consultation    Referral Reason:   Specialty Services Required    Requested Specialty:   Pulmonary Disease    Number of Visits Requested:   1    Requested Prescriptions   Signed Prescriptions Disp Refills  . allopurinol (ZYLOPRIM) 100 MG tablet 180 tablet 0    Sig: Take 2 tablets (200 mg total) by mouth daily.  . hydrochlorothiazide (HYDRODIURIL) 25 MG tablet 90 tablet 0    Sig: Take 1 tablet (25 mg total) by mouth daily. Patient needs office visit before any further refills  . potassium chloride SA (K-DUR,KLOR-CON) 20 MEQ tablet 90 tablet 0    Sig: Take 1 tablet (20 mEq total) by mouth daily.

## 2017-06-30 NOTE — Progress Notes (Signed)
[name] notified pt of results

## 2017-08-01 ENCOUNTER — Ambulatory Visit: Payer: Managed Care, Other (non HMO) | Admitting: Internal Medicine

## 2017-10-23 ENCOUNTER — Other Ambulatory Visit: Payer: Self-pay | Admitting: Family

## 2017-10-24 MED ORDER — ALLOPURINOL 100 MG PO TABS: 200.0000 mg | ORAL_TABLET | Freq: Every day | ORAL | 0 refills | Status: DC

## 2017-10-24 MED ORDER — HYDROCHLOROTHIAZIDE 25 MG PO TABS: 25.0000 mg | ORAL_TABLET | Freq: Every day | ORAL | 0 refills | Status: DC

## 2017-11-03 ENCOUNTER — Ambulatory Visit (INDEPENDENT_AMBULATORY_CARE_PROVIDER_SITE_OTHER): Payer: Managed Care, Other (non HMO) | Admitting: Internal Medicine

## 2017-11-03 ENCOUNTER — Other Ambulatory Visit: Payer: Managed Care, Other (non HMO)

## 2017-11-03 ENCOUNTER — Encounter: Payer: Self-pay | Admitting: Internal Medicine

## 2017-11-03 ENCOUNTER — Ambulatory Visit: Payer: Managed Care, Other (non HMO) | Admitting: Internal Medicine

## 2017-11-03 DIAGNOSIS — E538 Deficiency of other specified B group vitamins: Secondary | ICD-10-CM

## 2017-11-03 DIAGNOSIS — M1A00X0 Idiopathic chronic gout, unspecified site, without tophus (tophi): Secondary | ICD-10-CM | POA: Diagnosis not present

## 2017-11-03 DIAGNOSIS — G43709 Chronic migraine without aura, not intractable, without status migrainosus: Secondary | ICD-10-CM | POA: Diagnosis not present

## 2017-11-03 DIAGNOSIS — J301 Allergic rhinitis due to pollen: Secondary | ICD-10-CM | POA: Diagnosis not present

## 2017-11-03 DIAGNOSIS — E559 Vitamin D deficiency, unspecified: Secondary | ICD-10-CM

## 2017-11-03 DIAGNOSIS — I10 Essential (primary) hypertension: Secondary | ICD-10-CM | POA: Diagnosis not present

## 2017-11-03 DIAGNOSIS — B354 Tinea corporis: Secondary | ICD-10-CM

## 2017-11-03 MED ORDER — OMEPRAZOLE-SODIUM BICARBONATE 40-1100 MG PO CAPS: 1.0000 | ORAL_CAPSULE | Freq: Every day | ORAL | 3 refills | Status: DC

## 2017-11-03 MED ORDER — FLUCONAZOLE 100 MG PO TABS: ORAL_TABLET | ORAL | 1 refills | Status: DC

## 2017-11-03 MED ORDER — COLCHICINE 0.6 MG PO TABS: 0.6000 mg | ORAL_TABLET | Freq: Two times a day (BID) | ORAL | 2 refills | Status: DC | PRN

## 2017-11-03 MED ORDER — ALLOPURINOL 100 MG PO TABS: 100.0000 mg | ORAL_TABLET | Freq: Every day | ORAL | 3 refills | Status: DC

## 2017-11-03 MED ORDER — HYDROCHLOROTHIAZIDE 25 MG PO TABS: 25.0000 mg | ORAL_TABLET | Freq: Every day | ORAL | 3 refills | Status: DC

## 2017-11-03 MED ORDER — AZITHROMYCIN 250 MG PO TABS: ORAL_TABLET | ORAL | 0 refills | Status: DC

## 2017-11-03 MED ORDER — POTASSIUM CHLORIDE CRYS ER 20 MEQ PO TBCR: 20.0000 meq | EXTENDED_RELEASE_TABLET | Freq: Every day | ORAL | 3 refills | Status: DC

## 2017-11-03 MED ORDER — METRONIDAZOLE 500 MG PO TABS: 500.0000 mg | ORAL_TABLET | Freq: Three times a day (TID) | ORAL | 0 refills | Status: DC

## 2017-11-03 NOTE — Assessment & Plan Note (Signed)
B12 level On MVI

## 2017-11-03 NOTE — Assessment & Plan Note (Addendum)
Allopurinol  Colchicine prn

## 2017-11-03 NOTE — Assessment & Plan Note (Signed)
Diflucan

## 2017-11-03 NOTE — Assessment & Plan Note (Signed)
Claritin prn 

## 2017-11-03 NOTE — Assessment & Plan Note (Signed)
Vit D 

## 2017-11-03 NOTE — Assessment & Plan Note (Signed)
BP Readings from Last 3 Encounters:  11/03/17 132/82  06/29/17 136/74  10/14/16 130/84

## 2017-11-03 NOTE — Progress Notes (Signed)
Subjective:  Patient ID: [name], female    DOB: 13-Jul-1970  Age: 47 y.o. MRN: [id number]  CC: No chief complaint on file.   HPI [name] presents for URI sx's x 1 month F/u B12 def, gout, HTN  Outpatient Medications Prior to Visit  Medication Sig Dispense Refill  . allopurinol (ZYLOPRIM) 100 MG tablet Take 2 tablets (200 mg total) by mouth daily. Must keep visit on 11/03/17 to get refills 180 tablet 0  . Cholecalciferol (VITAMIN D3) 1000 units CAPS Take 1,000 Units by mouth daily.    . diazepam (VALIUM) 5 MG tablet take 1 tablet by mouth every 12 hours if needed for anxiety 30 tablet 1  . ferrous sulfate 325 (65 FE) MG EC tablet Take 325 mg by mouth daily.    . hydrochlorothiazide (HYDRODIURIL) 25 MG tablet Take 1 tablet (25 mg total) by mouth daily. Must keep visit on 11/03/17 to get refills 90 tablet 0  . Multiple Vitamins-Minerals (MULTIVITAMIN ADULTS PO) Take 1 tablet by mouth daily.    Marland Kitchen omeprazole-sodium bicarbonate (ZEGERID) 40-1100 MG capsule Take 1 capsule by mouth daily before breakfast.    . potassium chloride SA (K-DUR,KLOR-CON) 20 MEQ tablet Take 1 tablet (20 mEq total) by mouth daily. 90 tablet 0  . phenazopyridine (PYRIDIUM) 200 MG tablet Take 1 tablet (200 mg total) by mouth 3 (three) times daily. 6 tablet 0  . vitamin B-12 (CYANOCOBALAMIN) 1000 MCG tablet Take 1,000 mcg by mouth daily.     No facility-administered medications prior to visit.     ROS Review of Systems  Constitutional: Negative for activity change, appetite change, chills, fatigue and unexpected weight change.  HENT: Positive for congestion, rhinorrhea, sinus pressure and sinus pain. Negative for mouth sores.   Eyes: Negative for visual disturbance.  Respiratory: Negative for cough and chest tightness.   Gastrointestinal: Negative for abdominal pain and nausea.  Genitourinary: Negative for difficulty urinating, frequency and vaginal pain.  Musculoskeletal: Negative for back pain and  gait problem.  Skin: Positive for color change and rash. Negative for pallor.  Neurological: Negative for dizziness, tremors, weakness, numbness and headaches.  Psychiatric/Behavioral: Negative for confusion and sleep disturbance.    Objective:  BP 132/82 (BP Location: Left Arm, Patient Position: Sitting, Cuff Size: Normal)   Pulse 92   Temp 97.7 F (36.5 C) (Oral)   Ht  (1.702 m)   Wt 132 lb (59.9 kg)   SpO2 99%   BMI 20.67 kg/m   BP Readings from Last 3 Encounters:  11/03/17 132/82  06/29/17 136/74  10/14/16 130/84    Wt Readings from Last 3 Encounters:  11/03/17 132 lb (59.9 kg)  06/29/17 123 lb (55.8 kg)  10/14/16 125 lb 1.3 oz (56.7 kg)    Physical Exam  Constitutional: She appears well-developed. No distress.  HENT:  Head: Normocephalic.  Right Ear: External ear normal.  Left Ear: External ear normal.  Nose: Nose normal.  Mouth/Throat: Oropharynx is clear and moist.  Eyes: Pupils are equal, round, and reactive to light. Conjunctivae are normal. Right eye exhibits no discharge. Left eye exhibits no discharge.  Neck: Normal range of motion. Neck supple. No JVD present. No tracheal deviation present. No thyromegaly present.  Cardiovascular: Normal rate, regular rhythm and normal heart sounds.  Pulmonary/Chest: No stridor. No respiratory distress. She has no wheezes.  Abdominal: Soft. Bowel sounds are normal. She exhibits no distension and no mass. There is no tenderness. There is no rebound and no  guarding.  Musculoskeletal: She exhibits no edema or tenderness.  Lymphadenopathy:    She has no cervical adenopathy.  Neurological: She displays normal reflexes. No cranial nerve deficit. She exhibits normal muscle tone. Coordination normal.  Skin: Rash noted. No erythema.  Psychiatric: She has a normal mood and affect. Her behavior is normal. Judgment and thought content normal.   eryth nasal mucosa Macula rash  Lab Results  Component Value Date   WBC 10.3  06/29/2017   HGB 15.8 (H) 06/29/2017   HCT 47.1 (H) 06/29/2017   PLT 319.0 06/29/2017   GLUCOSE 92 06/29/2017   CHOL 223 (H) 04/09/2009   TRIG 94.0 04/09/2009   HDL 38.40 (L) 04/09/2009   LDLDIRECT 173.9 04/09/2009   ALT 19 06/29/2017   AST 23 06/29/2017   NA 138 06/29/2017   K 3.8 06/29/2017   CL 101 06/29/2017   CREATININE 0.77 06/29/2017   BUN 18 06/29/2017   CO2 27 06/29/2017   TSH 3.54 09/15/2015   INR 1.02 02/16/2013    Dg Chest 2 View  Result Date: 06/30/2017 CLINICAL DATA:  Cough and low-grade fever. Single episode of hemoptysis. EXAM: CHEST  2 VIEW COMPARISON:  Chest x-ray dated February 16, 2013. FINDINGS: The cardiomediastinal silhouette is normal in size. Normal pulmonary vascularity. Unchanged chronic blunting of the bilateral costophrenic angles. Stable scarring at the left lung base. No consolidation or pneumothorax. No acute osseous abnormality. Small hiatal hernia. IMPRESSION: No active cardiopulmonary disease. Electronically Signed   By: Obie Dredge M.D.   On: 06/30/2017 08:55    Assessment & Plan:   There are no diagnoses linked to this encounter. I have discontinued Laquasia D. Dunn's vitamin B-12 and phenazopyridine. I am also having her maintain her Vitamin D3, ferrous sulfate, Multiple Vitamins-Minerals (MULTIVITAMIN ADULTS PO), diazepam, omeprazole-sodium bicarbonate, potassium chloride SA, allopurinol, and hydrochlorothiazide.  No orders of the defined types were placed in this encounter.    Follow-up: No follow-ups on file.  Sonda Primes, MD

## 2017-11-03 NOTE — Assessment & Plan Note (Signed)
Taking Excedrin prn

## 2017-11-06 ENCOUNTER — Telehealth: Payer: Self-pay | Admitting: Internal Medicine

## 2017-11-06 NOTE — Telephone Encounter (Signed)
Copied from CRM [id number]. Topic: Quick Communication - Rx Refill/Question >> Nov 06, 2017  1:06 PM Landry Mellow wrote: Medication: HCTZ   Has the patient contacted their pharmacy? Yes.   (Agent: If no, request that the patient contact the pharmacy for the refill.) (Agent: If yes, when and what did the pharmacy advise?) said that they did not get it  [facility]erred Pharm[facility] (with phone number [facility] [address]   Pt also needs to have diazepam filled - forgot to ask about this at last visit  Agent: Please be advised that RX refills may take up to 3 business days. We ask that you follow-up with your pharmacy.

## 2017-11-07 NOTE — Telephone Encounter (Signed)
Left message for patient to call back- check pharmacy- may be at wrong pharmacy.

## 2017-11-07 NOTE — Telephone Encounter (Signed)
[facility] called and spoke to [place] about the HCTZ prescription. He verified it was received on 11/03/17 and picked up and paid for on 11/06/17.   Valium refill Last OV:11/03/17 Last refill:02/07/17 30 tab/1 refill ZOX:[id number] Pharmacy: [id number] [place], [place] - [address] [phone] (Phone) [phone] (Fax)

## 2017-11-08 NOTE — Telephone Encounter (Signed)
Patient called and said [facility] [address]. And that she got her other medication except for her diazepam .

## 2017-11-09 ENCOUNTER — Telehealth: Payer: Self-pay | Admitting: Internal Medicine

## 2017-11-09 MED ORDER — DIAZEPAM 5 MG PO TABS: ORAL_TABLET | ORAL | 1 refills | Status: DC

## 2017-11-09 NOTE — Telephone Encounter (Signed)
Pls advise on refill.../lmb 

## 2017-11-09 NOTE — Telephone Encounter (Signed)
Copied from CRM [id number]. Topic: Quick Communication - See Telephone Encounter >> Nov 09, 2017  3:56 PM Diana Eves B wrote: CRM for notification. See Telephone encounter for: 11/09/17. Pt went to pick up colchicine 0.6 MG tablet medication and it was going to cost her $200. She wants Dr. Posey Rea to consider calling in uloric  1x a day. Her sister took it for a year and hasn't had a flair up in 4 years nor has had to take any gout medication.  Please send to [id number] [place], [place] - [address]

## 2017-11-09 NOTE — Telephone Encounter (Signed)
Please advise about RX 

## 2017-11-09 NOTE — Telephone Encounter (Signed)
We do have samples of colchicine .6

## 2017-11-09 NOTE — Telephone Encounter (Signed)
Okay.  Done.  Thanks 

## 2017-11-09 NOTE — Telephone Encounter (Signed)
Pt calling and checking on refill on diazepam

## 2017-11-09 NOTE — Telephone Encounter (Signed)
Uppercase samples-use as needed.  Thanks

## 2017-11-10 NOTE — Telephone Encounter (Signed)
LM notifying pt

## 2017-11-10 NOTE — Telephone Encounter (Signed)
Called pt no answer LMOM MD sent rx to [facility]../lmb 

## 2017-11-27 ENCOUNTER — Other Ambulatory Visit: Payer: Self-pay

## 2017-11-27 ENCOUNTER — Inpatient Hospital Stay (HOSPITAL_COMMUNITY)
Admission: EM | Admit: 2017-11-27 | Discharge: 2017-11-30 | DRG: 392 | Disposition: A | Discharge status: Home / Self Care | Payer: Managed Care, Other (non HMO) | Attending: Internal Medicine | Admitting: Internal Medicine

## 2017-11-27 ENCOUNTER — Emergency Department (HOSPITAL_COMMUNITY): Payer: Managed Care, Other (non HMO)

## 2017-11-27 ENCOUNTER — Encounter (HOSPITAL_COMMUNITY): Payer: Self-pay

## 2017-11-27 DIAGNOSIS — K859 Acute pancreatitis without necrosis or infection, unspecified: Secondary | ICD-10-CM | POA: Diagnosis present

## 2017-11-27 DIAGNOSIS — M109 Gout, unspecified: Secondary | ICD-10-CM | POA: Diagnosis present

## 2017-11-27 DIAGNOSIS — R1030 Lower abdominal pain, unspecified: Secondary | ICD-10-CM | POA: Diagnosis not present

## 2017-11-27 DIAGNOSIS — R197 Diarrhea, unspecified: Secondary | ICD-10-CM

## 2017-11-27 DIAGNOSIS — I10 Essential (primary) hypertension: Secondary | ICD-10-CM | POA: Diagnosis present

## 2017-11-27 DIAGNOSIS — B999 Unspecified infectious disease: Secondary | ICD-10-CM

## 2017-11-27 DIAGNOSIS — D649 Anemia, unspecified: Secondary | ICD-10-CM | POA: Diagnosis present

## 2017-11-27 DIAGNOSIS — Z902 Acquired absence of lung [part of]: Secondary | ICD-10-CM

## 2017-11-27 DIAGNOSIS — K317 Polyp of stomach and duodenum: Secondary | ICD-10-CM | POA: Diagnosis present

## 2017-11-27 DIAGNOSIS — Z888 Allergy status to other drugs, medicaments and biological substances status: Secondary | ICD-10-CM

## 2017-11-27 DIAGNOSIS — K449 Diaphragmatic hernia without obstruction or gangrene: Secondary | ICD-10-CM | POA: Diagnosis not present

## 2017-11-27 DIAGNOSIS — R9389 Abnormal findings on diagnostic imaging of other specified body structures: Secondary | ICD-10-CM | POA: Diagnosis present

## 2017-11-27 DIAGNOSIS — E876 Hypokalemia: Secondary | ICD-10-CM | POA: Diagnosis present

## 2017-11-27 DIAGNOSIS — K5909 Other constipation: Secondary | ICD-10-CM | POA: Diagnosis present

## 2017-11-27 DIAGNOSIS — Z881 Allergy status to other antibiotic agents status: Secondary | ICD-10-CM

## 2017-11-27 DIAGNOSIS — K219 Gastro-esophageal reflux disease without esophagitis: Secondary | ICD-10-CM | POA: Diagnosis present

## 2017-11-27 DIAGNOSIS — K2270 Barrett's esophagus without dysplasia: Secondary | ICD-10-CM | POA: Diagnosis present

## 2017-11-27 DIAGNOSIS — R1110 Vomiting, unspecified: Secondary | ICD-10-CM

## 2017-11-27 DIAGNOSIS — R112 Nausea with vomiting, unspecified: Secondary | ICD-10-CM

## 2017-11-27 DIAGNOSIS — Z882 Allergy status to sulfonamides status: Secondary | ICD-10-CM

## 2017-11-27 DIAGNOSIS — T502X5A Adverse effect of carbonic-anhydrase inhibitors, benzothiadiazides and other diuretics, initial encounter: Secondary | ICD-10-CM | POA: Diagnosis present

## 2017-11-27 DIAGNOSIS — F419 Anxiety disorder, unspecified: Secondary | ICD-10-CM | POA: Diagnosis present

## 2017-11-27 DIAGNOSIS — D72829 Elevated white blood cell count, unspecified: Secondary | ICD-10-CM | POA: Diagnosis present

## 2017-11-27 DIAGNOSIS — Z885 Allergy status to narcotic agent status: Secondary | ICD-10-CM

## 2017-11-27 HISTORY — DX: Nausea with vomiting, unspecified: R11.2

## 2017-11-27 HISTORY — DX: Other specified postprocedural states: Z98.890

## 2017-11-27 LAB — COMPREHENSIVE METABOLIC PANEL
ALK PHOS: 65 U/L (ref 38–126)
ALT: 17 U/L (ref 14–54)
ANION GAP: 17 — AB (ref 5–15)
AST: 35 U/L (ref 15–41)
Albumin: 4.6 g/dL (ref 3.5–5.0)
BILIRUBIN TOTAL: 1.1 mg/dL (ref 0.3–1.2)
BUN: 11 mg/dL (ref 6–20)
CALCIUM: 10 mg/dL (ref 8.9–10.3)
CO2: 19 mmol/L — AB (ref 22–32)
CREATININE: 0.83 mg/dL (ref 0.44–1.00)
Chloride: 104 mmol/L (ref 101–111)
Glucose, Bld: 131 mg/dL — ABNORMAL HIGH (ref 65–99)
Potassium: 3.4 mmol/L — ABNORMAL LOW (ref 3.5–5.1)
SODIUM: 140 mmol/L (ref 135–145)
TOTAL PROTEIN: 8 g/dL (ref 6.5–8.1)

## 2017-11-27 LAB — I-STAT BETA HCG BLOOD, ED (MC, WL, AP ONLY): I-stat hCG, quantitative: <5 m[IU]/mL (ref <5)

## 2017-11-27 LAB — WET PREP, GENITAL
Sperm: NONE SEEN
Trich, Wet Prep: NONE SEEN
Yeast Wet Prep HPF POC: NONE SEEN

## 2017-11-27 LAB — CBC
HCT: 42.7 % (ref 36.0–46.0)
HEMOGLOBIN: 15 g/dL (ref 12.0–15.0)
MCH: 30 pg (ref 26.0–34.0)
MCHC: 35.1 g/dL (ref 30.0–36.0)
MCV: 85.4 fL (ref 78.0–100.0)
PLATELETS: 349 10*3/uL (ref 150–400)
RBC: 5 MIL/uL (ref 3.87–5.11)
RDW: 12.3 % (ref 11.5–15.5)
WBC: 20.7 10*3/uL — AB (ref 4.0–10.5)

## 2017-11-27 LAB — URINALYSIS, ROUTINE W REFLEX MICROSCOPIC
BILIRUBIN URINE: NEGATIVE
Bacteria, UA: NONE SEEN
GLUCOSE, UA: NEGATIVE mg/dL
Ketones, ur: 20 mg/dL — AB
LEUKOCYTES UA: NEGATIVE
NITRITE: NEGATIVE
PROTEIN: 100 mg/dL — AB
Specific Gravity, Urine: 1.013 (ref 1.005–1.030)
pH: 8 (ref 5.0–8.0)

## 2017-11-27 LAB — LIPASE, BLOOD: Lipase: 22 U/L (ref 11–51)

## 2017-11-27 MED ORDER — IBUPROFEN 200 MG PO TABS
600.0000 mg | ORAL_TABLET | Freq: Once | ORAL | Status: AC
  Administered 2017-11-27: 600 mg via ORAL
  Filled 2017-11-27: qty 3

## 2017-11-27 MED ORDER — FENTANYL CITRATE (PF) 100 MCG/2ML IJ SOLN
50.0000 ug | Freq: Once | INTRAMUSCULAR | Status: AC
  Administered 2017-11-27: 50 ug via INTRAVENOUS
  Filled 2017-11-27: qty 2

## 2017-11-27 MED ORDER — ONDANSETRON 4 MG PO TBDP
4.0000 mg | ORAL_TABLET | Freq: Once | ORAL | Status: AC | PRN
  Administered 2017-11-27: 4 mg via ORAL
  Filled 2017-11-27: qty 1

## 2017-11-27 MED ORDER — SODIUM CHLORIDE 0.9 % IV BOLUS
1000.0000 mL | Freq: Once | INTRAVENOUS | Status: AC
  Administered 2017-11-27: 1000 mL via INTRAVENOUS

## 2017-11-27 MED ORDER — PROMETHAZINE HCL 25 MG/ML IJ SOLN
12.5000 mg | Freq: Once | INTRAMUSCULAR | Status: AC
  Administered 2017-11-27: 12.5 mg via INTRAVENOUS
  Filled 2017-11-27: qty 1

## 2017-11-27 MED ORDER — IOPAMIDOL (ISOVUE-300) INJECTION 61%
100.0000 mL | Freq: Once | INTRAVENOUS | Status: AC | PRN
  Administered 2017-11-27: 100 mL via INTRAVENOUS

## 2017-11-27 MED ORDER — ONDANSETRON HCL 4 MG/2ML IJ SOLN
4.0000 mg | Freq: Once | INTRAMUSCULAR | Status: DC
  Filled 2017-11-27: qty 2

## 2017-11-27 NOTE — ED Triage Notes (Signed)
Patient c/o abdominal pain and vomiting since 1400. Patient states a very small amount of diarrhea. Patient went to PCP and did not see due to vomiting and was told to come to the ED.

## 2017-11-27 NOTE — ED Provider Notes (Signed)
[facility]-EMERGENCY DEPT Provider Note   CSN: [id number] Arrival date & time: 11/27/17  1623     History   Chief Complaint Chief Complaint  Patient presents with  . Abdominal Pain  . Emesis    HPI [name] is a 47 y.o. female with history of allergic rhinitis, anxiety, asthma, congenital cystic disease of lung, constipation, depression, GERD, migraine headaches, PE and DVT, and tinea versicolor presents for evaluation of acute onset, progressively worsening abdominal pain with associated nausea and vomiting.  She states that her symptoms began today at around 2 PM with one episode of nonbloody nonbilious emesis.  She states that she developed sharp stabbing suprapubic abdominal pain shortly thereafter.  Pain has been constant, worsens with movement and vomiting.  She notes at least 10 episodes of nonbloody nonbilious emesis and a small amount of watery nonbloody diarrhea.  She denies urinary symptoms.  She is currently menstruating, began on Thursday 5 days ago.  She states this pain feels different than her usual menstrual cramps.  She did eat a burger from a fast food restaurant yesterday but is unsure if this is related.  No recent treatment with antibiotics.  The history is provided by the patient.    Past Medical History:  Diagnosis Date  . Allergic rhinitis   . Anxiety   . Asthma   . Congenital cystic disease of lung   . Constipation   . Depression   . GERD (gastroesophageal reflux disease)   . Headache, migraine   . History of DVT of lower extremity   . Onychomycosis   . Palpitations   . Personal history of PE (pulmonary embolism)   . Tinea versicolor     Patient Active Problem List   Diagnosis Date Noted  . Essential hypertension 06/29/2017  . Gingivitis 10/14/2016  . Gout 01/03/2014  . Skin rash 05/14/2013  . Pityriasis rosea 05/14/2013  . BRONCHITIS 07/09/2010  . VENOUS INSUFFICIENCY, CHRONIC 12/04/2009  . Dermatophytosis of body  12/02/2009  . OTHER SPECIFIED DISEASE OF NAIL 12/02/2009  . OTITIS MEDIA 11/10/2009  . B12 deficiency 10/01/2009  . OTITIS EXTERNA 10/01/2009  . CERUMEN IMPACTION 10/01/2009  . Anxiety state 08/21/2009  . Vitamin D deficiency 02/09/2009  . HYPOKALEMIA 02/09/2009  . DIZZINESS 02/09/2009  . Other malaise and fatigue 02/09/2009  . VOMITING 02/06/2009  . EDEMA 12/24/2008  . UNSPECIFIED ANEMIA 12/12/2008  . Palpitations 12/12/2008  . UPPER RESPIRATORY INFECTION (URI) 11/18/2008  . SINUSITIS- ACUTE-NOS 09/26/2008  . ACUTE BRONCHOSPASM 09/26/2008  . Depression 09/09/2008  . ONYCHOMYCOSIS 05/26/2008  . Migraine headache 05/26/2008  . Allergic rhinitis 05/26/2008  . Asthma 05/26/2008  . GERD 05/26/2008  . HEMOPTYSIS 01/13/2007  . DVT, HX OF 01/13/2007    Past Surgical History:  Procedure Laterality Date  . LUNG LOBECTOMY     RL  . TUBAL LIGATION       OB History   None      Home Medications    Prior to Admission medications   Medication Sig Start Date End Date Taking? Authorizing Provider  allopurinol (ZYLOPRIM) 100 MG tablet Take 1 tablet (100 mg total) by mouth daily. Must keep visit on 11/03/17 to get refills Patient taking differently: Take 100 mg by mouth daily as needed (gout). Must keep visit on 11/03/17 to get refills 11/03/17  Yes Plotnikov, Georgina Quint, MD  Cholecalciferol (VITAMIN D3) 1000 units CAPS Take 1,000 Units by mouth daily.   Yes [provider]  ferrous sulfate  325 (65 FE) MG EC tablet Take 325 mg by mouth daily.   Yes [provider]  hydrochlorothiazide (HYDRODIURIL) 25 MG tablet Take 1 tablet (25 mg total) by mouth daily. Must keep visit on 11/03/17 to get refills 11/03/17  Yes Plotnikov, Georgina Quint, MD  Multiple Vitamins-Minerals (MULTIVITAMIN ADULTS PO) Take 1 tablet by mouth daily.   Yes [provider]  Omeprazole-Sodium Bicarbonate (ZEGERID OTC PO) Take 1 capsule by mouth daily.   Yes [provider]  potassium  chloride SA (K-DUR,KLOR-CON) 20 MEQ tablet Take 1 tablet (20 mEq total) by mouth daily. 11/03/17  Yes Plotnikov, Georgina Quint, MD  azithromycin (ZITHROMAX Z-PAK) 250 MG tablet As directed Patient not taking: Reported on 11/27/2017 11/03/17   Plotnikov, Georgina Quint, MD  colchicine 0.6 MG tablet Take 1 tablet (0.6 mg total) by mouth 2 (two) times daily as needed. Gout attacks 11/03/17   Plotnikov, Georgina Quint, MD  diazepam (VALIUM) 5 MG tablet take 1 tablet by mouth every 12 hours if needed for anxiety 11/09/17   Plotnikov, Georgina Quint, MD  fluconazole (DIFLUCAN) 100 MG tablet Take 2 tabs on day#1, then 1 tab daily on Days #2-10 11/03/17   Plotnikov, Georgina Quint, MD  metroNIDAZOLE (FLAGYL) 500 MG tablet Take 1 tablet (500 mg total) by mouth 3 (three) times daily. 11/03/17   Plotnikov, Georgina Quint, MD  omeprazole-sodium bicarbonate (ZEGERID) 40-1100 MG capsule Take 1 capsule by mouth daily before breakfast. Patient not taking: Reported on 11/27/2017 11/03/17   Plotnikov, Georgina Quint, MD    Family History Family History  Problem Relation Age of Onset  . Hypertension Other   . Diabetes Other     Social History Social History   Tobacco Use  . Smoking status: Never Smoker  . Smokeless tobacco: Never Used  Substance Use Topics  . Alcohol use: Yes    Comment: seldom  . Drug use: No     Allergies   Guaifenesin; Iron; Amoxapine and related; Codeine; Doxepin; Hydromorphone hcl; Metoprolol succinate; Morphine; Pantoprazole sodium; Pseudoeph-doxylamine-dm-apap; Pseudoephedrine; Sulfonamide derivatives; Tetracycline; and Prednisone   Review of Systems Review of Systems  Constitutional: Positive for fever.  Respiratory: Negative for shortness of breath.   Cardiovascular: Negative for chest pain.  Gastrointestinal: Positive for abdominal pain, diarrhea, nausea and vomiting. Negative for blood in stool and constipation.  Genitourinary: Positive for vaginal bleeding. Negative for dysuria, frequency, hematuria, vaginal  discharge and vaginal pain.  All other systems reviewed and are negative.    Physical Exam Updated Vital Signs BP 131/85   Pulse 83   Temp (!) 100.5 F (38.1 C) (Rectal)   Resp 15   Ht 5' 7.5" (1.715 m)   Wt 57.2 kg (126 lb)   LMP 11/27/2017   SpO2 97%   BMI 19.44 kg/m   Physical Exam  Constitutional: She appears well-developed and well-nourished. No distress.  HENT:  Head: Normocephalic and atraumatic.  Eyes: Conjunctivae are normal. Right eye exhibits no discharge. Left eye exhibits no discharge.  Neck: No JVD present. No tracheal deviation present.  Cardiovascular: Normal rate, regular rhythm and normal heart sounds.  Pulmonary/Chest: Effort normal and breath sounds normal.  Abdominal: Soft. She exhibits no distension. Bowel sounds are increased. There is no tenderness. There is no rigidity, no rebound, no guarding, no CVA tenderness, no tenderness at McBurney's point and negative Murphy's sign.  Genitourinary:  Genitourinary Comments: Examination performed in the presence of chaperone.  No masses or lesions to the external genitalia.  Patient has obvious  vaginal bleeding.  Patient was unable to tolerate the remainder of the GU exam, did not tolerate the speculum or bimanual exam secondary to pain.  Musculoskeletal: She exhibits no edema.  Neurological: She is alert.  Skin: Skin is warm and dry. No erythema.  Psychiatric: She has a normal mood and affect. Her behavior is normal.  Nursing note and vitals reviewed.    ED Treatments / Results  Labs (all labs ordered are listed, but only abnormal results are displayed) Labs Reviewed  WET PREP, GENITAL - Abnormal; Notable for the following components:      Result Value   Clue Cells Wet Prep HPF POC PRESENT (*)    WBC, Wet Prep HPF POC FEW (*)    All other components within normal limits  COMPREHENSIVE METABOLIC PANEL - Abnormal; Notable for the following components:   Potassium 3.4 (*)    CO2 19 (*)    Glucose, Bld 131  (*)    Anion gap 17 (*)    All other components within normal limits  CBC - Abnormal; Notable for the following components:   WBC 20.7 (*)    All other components within normal limits  URINALYSIS, ROUTINE W REFLEX MICROSCOPIC - Abnormal; Notable for the following components:   Hgb urine dipstick MODERATE (*)    Ketones, ur 20 (*)    Protein, ur 100 (*)    All other components within normal limits  I-STAT CG4 LACTIC ACID, ED - Abnormal; Notable for the following components:   Lactic Acid, Venous 2.05 (*)    All other components within normal limits  GASTROINTESTINAL PANEL BY PCR, STOOL (REPLACES STOOL CULTURE)  C DIFFICILE QUICK SCREEN W PCR REFLEX  CULTURE, BLOOD (ROUTINE X 2)  CULTURE, BLOOD (ROUTINE X 2)  LIPASE, BLOOD  I-STAT BETA HCG BLOOD, ED ([facility], [facility], [facility] ONLY)  I-STAT BETA HCG BLOOD, ED ([facility], [facility], [facility] ONLY)  I-STAT CG4 LACTIC ACID, ED  GC/CHLAMYDIA PROBE AMP ([place]) NOT AT [facility]    EKG None  Radiology Ct Abdomen Pelvis W Contrast  Result Date: 11/27/2017 CLINICAL DATA:  Abdominal pain. Vomiting. Small amount of diarrhea. EXAM: CT ABDOMEN AND PELVIS WITH CONTRAST TECHNIQUE: Multidetector CT imaging of the abdomen and pelvis was performed using the standard protocol following bolus administration of intravenous contrast. CONTRAST:  ISOVUE-300 IOPAMIDOL (ISOVUE-300) INJECTION 61% COMPARISON:  September 12, 2012 FINDINGS: Lower chest: Emphysematous changes in the bases. Scattered atelectasis. Moderate hiatal hernia. The distal esophagus is fluid-filled and mildly distended. Lung bases otherwise unremarkable. Hepatobiliary: Hepatic steatosis. The gallbladder is normal. No liver mass. The portal vein remains patent. Pancreas: There is increased attenuation of fat surrounding the pancreas. The process appears to be centered around the pancreas both anteriorly and posteriorly but there is a thin rim of fat attenuation between this material in the pancreas itself along much of its  course. The pancreatic parenchyma is for the most part not definitively inflamed. Normal enhancement of the pancreas identified. Spleen: Normal in size without focal abnormality. Adrenals/Urinary Tract: Adrenal glands are unremarkable. Kidneys are normal, without renal calculi, focal lesion, or hydronephrosis. Bladder is unremarkable. Stomach/Bowel: There is a moderate hiatal hernia. The remainder of the stomach is normal. The duodenum is normal in appearance. The remainder of the small bowel is unremarkable. Scattered colonic diverticuli are seen without diverticulitis. No colonic inflammation noted. The appendix is normal. Vascular/Lymphatic: The aorta demonstrates no atherosclerosis or aneurysm. No adenopathy. The high attenuation material adjacent to the pancreas also follows the  proximal course of celiac artery branches and the portal vein. There is mild resulting narrowing of the proximal celiac artery. Reproductive: Uterus and bilateral adnexa are unremarkable. Other: There is free fluid in the pelvis. There is a rind of increased attenuation in the fat adjacent to the pancreas as above. There is some fat stranding in the mesentery and some fluid tracking to the left into the left pericolic gutter. No free air. Musculoskeletal: No acute or significant osseous findings. IMPRESSION: 1. There is increased attenuation in the fat surrounding the pancreas. There does appear to be a thin preserved fat plane between this material and the pancreas itself and the pancreas is not definitively inflamed in appearance. Based on imaging alone, I would suspect pancreatitis. However, the patient's lipase is normal. Consider an autoimmune pancreatitis which can be seen without an elevated lipase. The high attenuation material around the pancreas also tracks along branches of the celiac artery with some mild narrowing of the celiac artery proximally. This raises the possibility of a vasculitis although I think this is less  likely as the bulk of the abnormality appears to be centered around the pancreas. Other unusual etiology such as lymphoma should be considered. The patient may benefit from a abdominal MRI. 2. Emphysematous changes in the lung bases. 3. Moderate hiatal hernia. The distal esophagus above the hernia is fluid-filled and mildly distended. Recommend clinical correlation. An upper GI could better evaluate if there is concern for obstruction. 4. Diverticulosis without diverticulitis in the colon. Electronically Signed   By: Gerome Samavid  Williams III M.D   On: 11/27/2017 23:55    Procedures Procedures (including critical care time)  Medications Ordered in ED Medications  piperacillin-tazobactam (ZOSYN) IVPB 3.375 g (has no administration in time range)  ondansetron (ZOFRAN-ODT) disintegrating tablet 4 mg (4 mg Oral Given 11/27/17 1640)  promethazine (PHENERGAN) injection 12.5 mg (12.5 mg Intravenous Given 11/27/17 2141)  sodium chloride 0.9 % bolus 1,000 mL (0 mLs Intravenous Stopped 11/27/17 2341)  fentaNYL (SUBLIMAZE) injection 50 mcg (50 mcg Intravenous Given 11/27/17 2141)  ibuprofen (ADVIL,MOTRIN) tablet 600 mg (600 mg Oral Given 11/27/17 2229)  iopamidol (ISOVUE-300) 61 % injection 100 mL (100 mLs Intravenous Contrast Given 11/27/17 2235)     Initial Impression / Assessment and Plan / ED Course  I have reviewed the triage vital signs and the nursing notes.  Pertinent labs & imaging results that were available during my care of the patient were reviewed by me and considered in my medical decision making (see chart for details).     Patient presents for evaluation of acute onset of abdominal pain, nausea and vomiting.  She has had a small amount of diarrhea additionally.  She is febrile to 100.5 F, hypertensive in the ED, initially mildly tachycardic on evaluation.  Abdomen is soft and nontender on initial evaluation, however patient has a leukocytosis of 20 suspicious for possible infection.  She also has  an elevated lactate.  Patient meets sepsis criteria.  Code sepsis was called, broad-spectrum antibiotics were initiated and fluids were given.  Remainder of lab work reviewed by me shows mild hypokalemia of 3.4, elevated anion gap of 17.  Creatinine, LFTs, and lipase are within normal limits.  UA is not concerning for UTI or nephrolithiasis but does show signs consistent with dehydration.  Patient did not tolerate pelvic examination/bimanual exam but was able to self swab and wet prep shows some clue cells.  She also did not tolerate the transvaginal portion of her ultrasound.  CT  scan of the abdomen and pelvis shows increased attenuation of the fat surrounding the pancreas.  Differential includes pancreatitis, autoimmune pancreatitis, vasculitis, or possible lymphoma.  Radiology recommends follow-up with an abdominal MRI.  Given fever, elevated lactate, and abnormal CT findings, patient would benefit from admission for further observation, evaluation, and abdominal MRI.  Awaiting hospitalist consult and results of pelvic ultrasound.  Patient was seen and evaluated by Dr. Corlis Leak who agrees with assessment and plan at this time.  Final Clinical Impressions(s) / ED Diagnoses   Final diagnoses:  Intra-abdominal infection  Lower abdominal pain  Nausea vomiting and diarrhea    ED Discharge Orders    None       Jeanie Sewer, PA-C 11/28/17 0024    Abelino Derrick, MD 11/30/17 2330

## 2017-11-28 DIAGNOSIS — M109 Gout, unspecified: Secondary | ICD-10-CM | POA: Diagnosis present

## 2017-11-28 DIAGNOSIS — Z885 Allergy status to narcotic agent status: Secondary | ICD-10-CM | POA: Diagnosis not present

## 2017-11-28 DIAGNOSIS — K2270 Barrett's esophagus without dysplasia: Secondary | ICD-10-CM | POA: Diagnosis present

## 2017-11-28 DIAGNOSIS — Z902 Acquired absence of lung [part of]: Secondary | ICD-10-CM | POA: Diagnosis not present

## 2017-11-28 DIAGNOSIS — K859 Acute pancreatitis without necrosis or infection, unspecified: Secondary | ICD-10-CM | POA: Diagnosis not present

## 2017-11-28 DIAGNOSIS — Z888 Allergy status to other drugs, medicaments and biological substances status: Secondary | ICD-10-CM | POA: Diagnosis not present

## 2017-11-28 DIAGNOSIS — R197 Diarrhea, unspecified: Secondary | ICD-10-CM

## 2017-11-28 DIAGNOSIS — R112 Nausea with vomiting, unspecified: Secondary | ICD-10-CM | POA: Diagnosis not present

## 2017-11-28 DIAGNOSIS — E876 Hypokalemia: Secondary | ICD-10-CM | POA: Diagnosis present

## 2017-11-28 DIAGNOSIS — R1030 Lower abdominal pain, unspecified: Secondary | ICD-10-CM | POA: Diagnosis present

## 2017-11-28 DIAGNOSIS — D649 Anemia, unspecified: Secondary | ICD-10-CM | POA: Diagnosis present

## 2017-11-28 DIAGNOSIS — K449 Diaphragmatic hernia without obstruction or gangrene: Secondary | ICD-10-CM | POA: Diagnosis present

## 2017-11-28 DIAGNOSIS — Z882 Allergy status to sulfonamides status: Secondary | ICD-10-CM | POA: Diagnosis not present

## 2017-11-28 DIAGNOSIS — B999 Unspecified infectious disease: Secondary | ICD-10-CM | POA: Diagnosis not present

## 2017-11-28 DIAGNOSIS — R1114 Bilious vomiting: Secondary | ICD-10-CM | POA: Diagnosis not present

## 2017-11-28 DIAGNOSIS — K317 Polyp of stomach and duodenum: Secondary | ICD-10-CM | POA: Diagnosis present

## 2017-11-28 DIAGNOSIS — Z881 Allergy status to other antibiotic agents status: Secondary | ICD-10-CM | POA: Diagnosis not present

## 2017-11-28 DIAGNOSIS — I10 Essential (primary) hypertension: Secondary | ICD-10-CM | POA: Diagnosis present

## 2017-11-28 DIAGNOSIS — T502X5A Adverse effect of carbonic-anhydrase inhibitors, benzothiadiazides and other diuretics, initial encounter: Secondary | ICD-10-CM | POA: Diagnosis present

## 2017-11-28 DIAGNOSIS — K219 Gastro-esophageal reflux disease without esophagitis: Secondary | ICD-10-CM | POA: Diagnosis present

## 2017-11-28 DIAGNOSIS — K22719 Barrett's esophagus with dysplasia, unspecified: Secondary | ICD-10-CM | POA: Diagnosis not present

## 2017-11-28 DIAGNOSIS — K5909 Other constipation: Secondary | ICD-10-CM | POA: Diagnosis present

## 2017-11-28 DIAGNOSIS — D72829 Elevated white blood cell count, unspecified: Secondary | ICD-10-CM | POA: Diagnosis present

## 2017-11-28 DIAGNOSIS — F419 Anxiety disorder, unspecified: Secondary | ICD-10-CM | POA: Diagnosis present

## 2017-11-28 DIAGNOSIS — R9389 Abnormal findings on diagnostic imaging of other specified body structures: Secondary | ICD-10-CM | POA: Diagnosis present

## 2017-11-28 LAB — CBC
HEMATOCRIT: 37.5 % (ref 36.0–46.0)
Hemoglobin: 12.6 g/dL (ref 12.0–15.0)
MCH: 29 pg (ref 26.0–34.0)
MCHC: 33.6 g/dL (ref 30.0–36.0)
MCV: 86.2 fL (ref 78.0–100.0)
Platelets: 340 10*3/uL (ref 150–400)
RBC: 4.35 MIL/uL (ref 3.87–5.11)
RDW: 12.7 % (ref 11.5–15.5)
WBC: 20.8 10*3/uL — ABNORMAL HIGH (ref 4.0–10.5)

## 2017-11-28 LAB — COMPREHENSIVE METABOLIC PANEL
ALBUMIN: 4.2 g/dL (ref 3.5–5.0)
ALK PHOS: 55 U/L (ref 38–126)
ALT: 18 U/L (ref 14–54)
ANION GAP: 10 (ref 5–15)
AST: 24 U/L (ref 15–41)
BILIRUBIN TOTAL: 0.9 mg/dL (ref 0.3–1.2)
BUN: 12 mg/dL (ref 6–20)
CALCIUM: 9.2 mg/dL (ref 8.9–10.3)
CO2: 28 mmol/L (ref 22–32)
Chloride: 105 mmol/L (ref 101–111)
Creatinine, Ser: 0.77 mg/dL (ref 0.44–1.00)
GFR calc Af Amer: >60 mL/min (ref >60)
GFR calc non Af Amer: >60 mL/min (ref >60)
GLUCOSE: 96 mg/dL (ref 65–99)
Potassium: 2.9 mmol/L — ABNORMAL LOW (ref 3.5–5.1)
SODIUM: 143 mmol/L (ref 135–145)
TOTAL PROTEIN: 7.5 g/dL (ref 6.5–8.1)

## 2017-11-28 LAB — HIV ANTIBODY (ROUTINE TESTING W REFLEX): HIV Screen 4th Generation wRfx: NONREACTIVE

## 2017-11-28 LAB — GC/CHLAMYDIA PROBE AMP (~~LOC~~) NOT AT ARMC
Chlamydia: NEGATIVE
Neisseria Gonorrhea: NEGATIVE

## 2017-11-28 LAB — I-STAT CG4 LACTIC ACID, ED: Lactic Acid, Venous: 2.05 mmol/L (ref 0.5–1.9)

## 2017-11-28 LAB — LIPID PANEL
CHOLESTEROL: 222 mg/dL — AB (ref 0–200)
HDL: 37 mg/dL — ABNORMAL LOW (ref >40)
LDL CALC: 152 mg/dL — AB (ref 0–99)
TRIGLYCERIDES: 164 mg/dL — AB (ref <150)
Total CHOL/HDL Ratio: 6 RATIO
VLDL: 33 mg/dL (ref 0–40)

## 2017-11-28 LAB — LACTIC ACID, PLASMA
Lactic Acid, Venous: 1.2 mmol/L (ref 0.5–1.9)
Lactic Acid, Venous: 1.2 mmol/L (ref 0.5–1.9)

## 2017-11-28 MED ORDER — FERROUS SULFATE 325 (65 FE) MG PO TABS
325.0000 mg | ORAL_TABLET | Freq: Every day | ORAL | Status: DC
  Administered 2017-11-28 – 2017-11-30 (×2): 325 mg via ORAL
  Filled 2017-11-28 (×3): qty 1

## 2017-11-28 MED ORDER — PIPERACILLIN-TAZOBACTAM 3.375 G IVPB
3.3750 g | Freq: Three times a day (TID) | INTRAVENOUS | Status: DC
  Administered 2017-11-28: 3.375 g via INTRAVENOUS
  Filled 2017-11-28: qty 50

## 2017-11-28 MED ORDER — VITAMIN D 1000 UNITS PO TABS
1000.0000 [IU] | ORAL_TABLET | Freq: Every day | ORAL | Status: DC
  Administered 2017-11-28 – 2017-11-30 (×2): 1000 [IU] via ORAL
  Filled 2017-11-28 (×3): qty 1

## 2017-11-28 MED ORDER — ACETAMINOPHEN 325 MG PO TABS
650.0000 mg | ORAL_TABLET | Freq: Four times a day (QID) | ORAL | Status: DC | PRN
  Administered 2017-11-28: 650 mg via ORAL
  Filled 2017-11-28: qty 2

## 2017-11-28 MED ORDER — POTASSIUM CHLORIDE CRYS ER 20 MEQ PO TBCR
40.0000 meq | EXTENDED_RELEASE_TABLET | ORAL | Status: AC
  Administered 2017-11-28 (×2): 40 meq via ORAL
  Filled 2017-11-28 (×2): qty 2

## 2017-11-28 MED ORDER — ENOXAPARIN SODIUM 40 MG/0.4ML ~~LOC~~ SOLN
40.0000 mg | SUBCUTANEOUS | Status: DC
  Administered 2017-11-28 – 2017-11-30 (×2): 40 mg via SUBCUTANEOUS
  Filled 2017-11-28 (×3): qty 0.4

## 2017-11-28 MED ORDER — PIPERACILLIN-TAZOBACTAM 3.375 G IVPB 30 MIN
3.3750 g | Freq: Once | INTRAVENOUS | Status: AC
  Administered 2017-11-28: 3.375 g via INTRAVENOUS
  Filled 2017-11-28: qty 50

## 2017-11-28 MED ORDER — DIAZEPAM 5 MG PO TABS: 5.0000 mg | ORAL_TABLET | Freq: Two times a day (BID) | ORAL | Status: DC | PRN

## 2017-11-28 MED ORDER — SODIUM CHLORIDE 0.9 % IV SOLN
INTRAVENOUS | Status: DC
  Administered 2017-11-28 – 2017-11-29 (×2): via INTRAVENOUS

## 2017-11-28 MED ORDER — OMEPRAZOLE 20 MG PO CPDR
40.0000 mg | DELAYED_RELEASE_CAPSULE | Freq: Every day | ORAL | Status: DC
  Administered 2017-11-29: 40 mg via ORAL
  Filled 2017-11-28 (×2): qty 2

## 2017-11-28 MED ORDER — FENTANYL CITRATE (PF) 100 MCG/2ML IJ SOLN: 50.0000 ug | INTRAMUSCULAR | Status: DC | PRN

## 2017-11-28 MED ORDER — ACETAMINOPHEN 650 MG RE SUPP: 650.0000 mg | Freq: Four times a day (QID) | RECTAL | Status: DC | PRN

## 2017-11-28 MED ORDER — ONDANSETRON HCL 4 MG/2ML IJ SOLN
4.0000 mg | Freq: Four times a day (QID) | INTRAMUSCULAR | Status: DC | PRN
  Administered 2017-11-28 – 2017-11-29 (×3): 4 mg via INTRAVENOUS
  Filled 2017-11-28 (×3): qty 2

## 2017-11-28 MED ORDER — ONDANSETRON HCL 4 MG PO TABS: 4.0000 mg | ORAL_TABLET | Freq: Four times a day (QID) | ORAL | Status: DC | PRN

## 2017-11-28 MED ORDER — OMEPRAZOLE 20 MG PO CPDR
20.0000 mg | DELAYED_RELEASE_CAPSULE | Freq: Every day | ORAL | Status: DC
  Administered 2017-11-28: 20 mg via ORAL
  Filled 2017-11-28: qty 1

## 2017-11-28 MED ORDER — OMEPRAZOLE-SODIUM BICARBONATE 20-1100 MG PO CAPS: 1.0000 | ORAL_CAPSULE | Freq: Every day | ORAL | Status: DC

## 2017-11-28 NOTE — ED Notes (Signed)
ED TO INPATIENT HANDOFF REPORT  Name/Age/Gender [name] 47 y.o. female  Code Status    Code Status Orders  (From admission, onward)        Start     Ordered   11/28/17 0126  Full code  Continuous     11/28/17 0128    Code Status History    This patient has a current code status but no historical code status.      Home/SNF/Other Home  Chief Complaint emesis / abd pain   Level of Care/Admitting Diagnosis ED Disposition    ED Disposition Condition Comment   Admit  Hospital Area: [place] [facility] [[id number]]  Level of Care: Med-Surg [16]  Diagnosis: Acute pancreatitis [577.0.ICD-9-CM]  Admitting Physician: Etta Quill [[id number]]  Attending Physician: Etta Quill [[id number]]  Estimated length of stay: past midnight tomorrow  Certification:: I certify this patient will need inpatient services for at least 2 midnights  PT Class (Do Not Modify): Inpatient [101]  PT Acc Code (Do Not Modify): Private [1]       Medical History Past Medical History:  Diagnosis Date  . Allergic rhinitis   . Anxiety   . Asthma   . Congenital cystic disease of lung   . Constipation   . Depression   . GERD (gastroesophageal reflux disease)   . Headache, migraine   . History of DVT of lower extremity   . Onychomycosis   . Palpitations   . Personal history of PE (pulmonary embolism)   . Tinea versicolor     Allergies Allergies  Allergen Reactions  . Guaifenesin Shortness Of Breath  . Iron Anaphylaxis    IV only  . Amoxapine And Related   . Codeine Other (See Comments)    tiredness  . Doxepin Hives  . Hydromorphone Hcl Nausea And Vomiting  . Metoprolol Succinate     REACTION: edema  . Morphine Other (See Comments)    Does not work  . Pantoprazole Sodium Other (See Comments)    heartburn  . Pseudoeph-Doxylamine-Dm-Apap Hives  . Pseudoephedrine Hives  . Sulfonamide Derivatives Hives  . Tetracycline Hives  . Prednisone Rash    IV  Location/Drains/Wounds Patient Lines/Drains/Airways Status   Active Line/Drains/Airways    Name:   Placement date:   Placement time:   Site:   Days:   Peripheral IV 11/27/17 Right Wrist   11/27/17    2145    Wrist   1          Labs/Imaging Results for orders placed or performed during the hospital encounter of 11/27/17 (from the past 48 hour(s))  Lipase, blood     Status: None   Collection Time: 11/27/17  4:55 PM  Result Value Ref Range   Lipase 22 11 - 51 U/L    Comment: Performed at [facility], [place] [address] [place], [place] [address]  Comprehensive metabolic panel     Status: Abnormal   Collection Time: 11/27/17  4:55 PM  Result Value Ref Range   Sodium 140 135 - 145 mmol/L   Potassium 3.4 (L) 3.5 - 5.1 mmol/L   Chloride 104 101 - 111 mmol/L   CO2 19 (L) 22 - 32 mmol/L   Glucose, Bld 131 (H) 65 - 99 mg/dL   BUN 11 6 - 20 mg/dL   Creatinine, Ser 0.83 0.44 - 1.00 mg/dL   Calcium 10.0 8.9 - 10.3 mg/dL   Total Protein 8.0 6.5 - 8.1 g/dL   Albumin  4.6 3.5 - 5.0 g/dL   AST 35 15 - 41 U/L   ALT 17 14 - 54 U/L   Alkaline Phosphatase 65 38 - 126 U/L   Total Bilirubin 1.1 0.3 - 1.2 mg/dL   GFR calc non Af Amer >60 >60 mL/min   GFR calc Af Amer >60 >60 mL/min    Comment: (NOTE) The eGFR has been calculated using the CKD EPI equation. This calculation has not been validated in all clinical situations. eGFR's persistently <60 mL/min signify possible Chronic Kidney Disease.    Anion gap 17 (H) 5 - 15    Comment: Performed at [facility], [place] [address] [place], [place] [address]  CBC     Status: Abnormal   Collection Time: 11/27/17  4:55 PM  Result Value Ref Range   WBC 20.7 (H) 4.0 - 10.5 K/uL   RBC 5.00 3.87 - 5.11 MIL/uL   Hemoglobin 15.0 12.0 - 15.0 g/dL   HCT 42.7 36.0 - 46.0 %   MCV 85.4 78.0 - 100.0 fL   MCH 30.0 26.0 - 34.0 pg   MCHC 35.1 30.0 - 36.0 g/dL   RDW 12.3 11.5 - 15.5 %   Platelets 349 150 - 400 K/uL     Comment: Performed at [facility], [place] [address], [place], [place] [address]  I-Stat beta hCG blood, ED     Status: None   Collection Time: 11/27/17  5:00 PM  Result Value Ref Range   I-stat hCG, quantitative <5.0 <5 mIU/mL   Comment 3            Comment:   GEST. AGE      CONC.  (mIU/mL)   <=1 WEEK        5 - 50     2 WEEKS       50 - 500     3 WEEKS       100 - 10,000     4 WEEKS     1,000 - 30,000        FEMALE AND NON-PREGNANT FEMALE:     LESS THAN 5 mIU/mL   Urinalysis, Routine w reflex microscopic     Status: Abnormal   Collection Time: 11/27/17  9:37 PM  Result Value Ref Range   Color, Urine YELLOW YELLOW   APPearance CLEAR CLEAR   Specific Gravity, Urine 1.013 1.005 - 1.030   pH 8.0 5.0 - 8.0   Glucose, UA NEGATIVE NEGATIVE mg/dL   Hgb urine dipstick MODERATE (A) NEGATIVE   Bilirubin Urine NEGATIVE NEGATIVE   Ketones, ur 20 (A) NEGATIVE mg/dL   Protein, ur 100 (A) NEGATIVE mg/dL   Nitrite NEGATIVE NEGATIVE   Leukocytes, UA NEGATIVE NEGATIVE   RBC / HPF 11-20 0 - 5 RBC/hpf   WBC, UA 0-5 0 - 5 WBC/hpf   Bacteria, UA NONE SEEN NONE SEEN   Mucus PRESENT     Comment: Performed at [facility], [place] [address] [place], [place] [address]  Wet prep, genital     Status: Abnormal   Collection Time: 11/27/17  9:38 PM  Result Value Ref Range   Yeast Wet Prep HPF POC NONE SEEN NONE SEEN   Trich, Wet Prep NONE SEEN NONE SEEN   Clue Cells Wet Prep HPF POC PRESENT (A) NONE SEEN   WBC, Wet Prep HPF POC FEW (A) NONE SEEN   Sperm NONE SEEN     Comment: Performed at [facility],  [place] [address] [place], [place] [address]  I-Stat beta hCG blood, ED ([facility], [facility], [facility] only)     Status: None   Collection Time: 11/27/17  9:42 PM  Result Value Ref Range   I-stat hCG, quantitative <5.0 <5 mIU/mL   Comment 3            Comment:   GEST. AGE      CONC.  (mIU/mL)   <=1 WEEK        5 - 50     2 WEEKS       50 - 500     3 WEEKS       100 -  10,000     4 WEEKS     1,000 - 30,000        FEMALE AND NON-PREGNANT FEMALE:     LESS THAN 5 mIU/mL   I-Stat CG4 Lactic Acid, ED     Status: Abnormal   Collection Time: 11/27/17 11:58 PM  Result Value Ref Range   Lactic Acid, Venous 2.05 (HH) 0.5 - 1.9 mmol/L   Comment NOTIFIED PHYSICIAN    US Pelvis Complete  Result Date: 11/28/2017 CLINICAL DATA:  Initial evaluation for acute mid lower pelvic pain. EXAM: TRANSABDOMINAL ULTRASOUND OF PELVIS DOPPLER ULTRASOUND OF OVARIES TECHNIQUE: Transabdominal ultrasound examination of the pelvis was performed including evaluation of the uterus, ovaries, adnexal regions, and pelvic cul-de-sac. Color and duplex Doppler ultrasound was utilized to evaluate blood flow to the ovaries. COMPARISON:  Prior CT from earlier the same day. FINDINGS: Uterus Measurements: 7.4 x 3.4 x 4.0 cm. No fibroids or other mass visualized. Endometrium Thickness: 6.7 mm.  No focal abnormality visualized. Right ovary Measurements: 3.7 x 2.4 x 2.7 cm. Normal appearance/no adnexal mass. Left ovary Measurements: 4.4 x 2.2 x 3.6 cm. Normal appearance/no adnexal mass. Pulsed Doppler evaluation of both ovaries demonstrates normal low-resistance arterial and venous waveforms. Other findings No abnormal free fluid. IMPRESSION: Normal pelvic ultrasound. No evidence for torsion or other acute abnormality. Electronically Signed   By: Jeannine Boga M.D.   On: 11/28/2017 00:25   Ct Abdomen Pelvis W Contrast  Result Date: 11/27/2017 CLINICAL DATA:  Abdominal pain. Vomiting. Small amount of diarrhea. EXAM: CT ABDOMEN AND PELVIS WITH CONTRAST TECHNIQUE: Multidetector CT imaging of the abdomen and pelvis was performed using the standard protocol following bolus administration of intravenous contrast. CONTRAST:  142m ISOVUE-300 IOPAMIDOL (ISOVUE-300) INJECTION 61% COMPARISON:  September 12, 2012 FINDINGS: Lower chest: Emphysematous changes in the bases. Scattered atelectasis. Moderate hiatal hernia. The  distal esophagus is fluid-filled and mildly distended. Lung bases otherwise unremarkable. Hepatobiliary: Hepatic steatosis. The gallbladder is normal. No liver mass. The portal vein remains patent. Pancreas: There is increased attenuation of fat surrounding the pancreas. The process appears to be centered around the pancreas both anteriorly and posteriorly but there is a thin rim of fat attenuation between this material in the pancreas itself along much of its course. The pancreatic parenchyma is for the most part not definitively inflamed. Normal enhancement of the pancreas identified. Spleen: Normal in size without focal abnormality. Adrenals/Urinary Tract: Adrenal glands are unremarkable. Kidneys are normal, without renal calculi, focal lesion, or hydronephrosis. Bladder is unremarkable. Stomach/Bowel: There is a moderate hiatal hernia. The remainder of the stomach is normal. The duodenum is normal in appearance. The remainder of the small bowel is unremarkable. Scattered colonic diverticuli are seen without diverticulitis. No colonic inflammation noted. The appendix is normal. Vascular/Lymphatic: The aorta demonstrates no atherosclerosis or aneurysm. No  adenopathy. The high attenuation material adjacent to the pancreas also follows the proximal course of celiac artery branches and the portal vein. There is mild resulting narrowing of the proximal celiac artery. Reproductive: Uterus and bilateral adnexa are unremarkable. Other: There is free fluid in the pelvis. There is a rind of increased attenuation in the fat adjacent to the pancreas as above. There is some fat stranding in the mesentery and some fluid tracking to the left into the left pericolic gutter. No free air. Musculoskeletal: No acute or significant osseous findings. IMPRESSION: 1. There is increased attenuation in the fat surrounding the pancreas. There does appear to be a thin preserved fat plane between this material and the pancreas itself and the  pancreas is not definitively inflamed in appearance. Based on imaging alone, I would suspect pancreatitis. However, the patient's lipase is normal. Consider an autoimmune pancreatitis which can be seen without an elevated lipase. The high attenuation material around the pancreas also tracks along branches of the celiac artery with some mild narrowing of the celiac artery proximally. This raises the possibility of a vasculitis although I think this is less likely as the bulk of the abnormality appears to be centered around the pancreas. Other unusual etiology such as lymphoma should be considered. The patient may benefit from a abdominal MRI. 2. Emphysematous changes in the lung bases. 3. Moderate hiatal hernia. The distal esophagus above the hernia is fluid-filled and mildly distended. Recommend clinical correlation. An upper GI could better evaluate if there is concern for obstruction. 4. Diverticulosis without diverticulitis in the colon. Electronically Signed   By: Dorise Bullion III M.D   On: 11/27/2017 23:55   [place] Art/ven Flow Abd Pelv Doppler  Result Date: 11/28/2017 CLINICAL DATA:  Initial evaluation for acute mid lower pelvic pain. EXAM: TRANSABDOMINAL ULTRASOUND OF PELVIS DOPPLER ULTRASOUND OF OVARIES TECHNIQUE: Transabdominal ultrasound examination of the pelvis was performed including evaluation of the uterus, ovaries, adnexal regions, and pelvic cul-de-sac. Color and duplex Doppler ultrasound was utilized to evaluate blood flow to the ovaries. COMPARISON:  Prior CT from earlier the same day. FINDINGS: Uterus Measurements: 7.4 x 3.4 x 4.0 cm. No fibroids or other mass visualized. Endometrium Thickness: 6.7 mm.  No focal abnormality visualized. Right ovary Measurements: 3.7 x 2.4 x 2.7 cm. Normal appearance/no adnexal mass. Left ovary Measurements: 4.4 x 2.2 x 3.6 cm. Normal appearance/no adnexal mass. Pulsed Doppler evaluation of both ovaries demonstrates normal low-resistance arterial and venous  waveforms. Other findings No abnormal free fluid. IMPRESSION: Normal pelvic ultrasound. No evidence for torsion or other acute abnormality. Electronically Signed   By: Jeannine Boga M.D.   On: 11/28/2017 00:25    Pending Labs Unresulted Labs (From admission, onward)   Start     Ordered   11/28/17 0500  Lipid panel  Tomorrow morning,   R     11/28/17 0128   11/28/17 0500  CBC  Tomorrow morning,   R     11/28/17 0128   11/28/17 0500  Comprehensive metabolic panel  Tomorrow morning,   R     11/28/17 0128   11/28/17 0126  HIV antibody (Routine Testing)  Once,   R     11/28/17 0128   11/28/17 0006  Blood Culture (routine x 2)  BLOOD CULTURE X 2,   STAT     11/28/17 0005   11/27/17 2311  C Difficile Quick Screen w PCR reflex  Once,   R     11/27/17 2311  11/27/17 2138  Gastrointestinal Panel by PCR , Stool  (Gastrointestinal Panel by PCR, Stool)  Once,   R     11/27/17 2137      Vitals/Pain Today's Vitals   11/27/17 2230 11/28/17 0000 11/28/17 0045 11/28/17 0130  BP:  131/85 125/80 132/89  Pulse:  83 86 97  Resp:  _0 Temp:      TempSrc:      SpO2:  97% 96% 96%  Weight:      Height:      PainSc: 1        Isolation Precautions No active isolations  Medications Medications  Vitamin D3 CAPS 1,000 Units (has no administration in time range)  diazepam (VALIUM) tablet 5 mg (has no administration in time range)  ferrous sulfate EC tablet 325 mg (has no administration in time range)  Omeprazole-Sodium Bicarbonate (ZEGERID) 20-1100 MG capsule 1 capsule (has no administration in time range)  piperacillin-tazobactam (ZOSYN) IVPB 3.375 g (has no administration in time range)  acetaminophen (TYLENOL) tablet 650 mg (has no administration in time range)    Or  acetaminophen (TYLENOL) suppository 650 mg (has no administration in time range)  ondansetron (ZOFRAN) tablet 4 mg (has no administration in time range)    Or  ondansetron (ZOFRAN) injection 4 mg (has no  administration in time range)  enoxaparin (LOVENOX) injection 40 mg (has no administration in time range)  0.9 %  sodium chloride infusion (has no administration in time range)  fentaNYL (SUBLIMAZE) injection 50 mcg (has no administration in time range)  ondansetron (ZOFRAN-ODT) disintegrating tablet 4 mg (4 mg Oral Given 11/27/17 1640)  promethazine (PHENERGAN) injection 12.5 mg (12.5 mg Intravenous Given 11/27/17 2141)  sodium chloride 0.9 % bolus 1,000 mL (0 mLs Intravenous Stopped 11/27/17 2341)  fentaNYL (SUBLIMAZE) injection 50 mcg (50 mcg Intravenous Given 11/27/17 2141)  ibuprofen (ADVIL,MOTRIN) tablet 600 mg (600 mg Oral Given 11/27/17 2229)  iopamidol (ISOVUE-300) 61 % injection 100 mL (100 mLs Intravenous Contrast Given 11/27/17 2235)  piperacillin-tazobactam (ZOSYN) IVPB 3.375 g (0 g Intravenous Stopped 11/28/17 0135)    Mobility walks

## 2017-11-28 NOTE — Progress Notes (Signed)
A consult was received from an ED physician for zosn per pharmacy dosing.  The patient's profile has been reviewed for ht/wt/allergies/indication/available labs.   A one time order has been placed for Zosyn 3.375 gm.  Further antibiotics/pharmacy consults should be ordered by admitting physician if indicated.                       Thank you, Lorenza EvangelistGreen, Joscelin Fray R 11/28/2017  12:11 AM

## 2017-11-28 NOTE — H&P (Signed)
History and Physical    Carla EvesBrandi D Rathke NWG:[id number] DOB: 03/21/1971 DOA: 11/27/2017  PCP: Tresa GarterPlotnikov, Aleksei V, MD  Patient coming from: Home  I have personally briefly reviewed patient's old medical [facility]  Chief Complaint: Abd pain, N/V  HPI: [name] is a 47 y.o. 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  Patient presents to the ED with c/o abd pain, N/V.  Symptoms onset 2pm today.  One episode of NBNB emesis.  Sharp stabbing suprapubic abd pain shortly there after.  Symptoms constant, worsens with movement and vomiting.  10 episodes of NBNB emesis since that time and small amount of watery, NB diarrhea.  Did eat burger at fast food restaurant yesterday, unsure if related.   ED Course: CT abd / pelvis suspicious for acute pancreatitis.  Lipase negative.  WBC 20k.  Tm 100.5. Pelvic US neg.  Started on zosyn.  Hospitalist asked to admit.  Review of Systems: As per HPI otherwise 10 point review of systems negative.   Past Medical History:  Diagnosis Date  . Allergic rhinitis   . Anxiety   . Asthma   . Congenital cystic disease of lung   . Constipation   . Depression   . GERD (gastroesophageal reflux disease)   . Headache, migraine   . History of DVT of lower extremity   . Onychomycosis   . Palpitations   . Personal history of PE (pulmonary embolism)   . Tinea versicolor     Past Surgical History:  Procedure Laterality Date  . LUNG LOBECTOMY     RL  . TUBAL LIGATION       reports that she has never smoked. She has never used smokeless tobacco. She reports that she drinks alcohol. She reports that she does not use drugs.  Allergies  Allergen Reactions  . Guaifenesin Shortness Of Breath  . Iron Anaphylaxis    IV only  . Amoxapine And Related   . Codeine Other (See Comments)    tiredness  . Doxepin Hives  . Hydromorphone Hcl Nausea And Vomiting  . Metoprolol Succinate     REACTION: edema  . Morphine Other (See Comments)    Does not work  . Pantoprazole Sodium Other (See Comments)    heartburn  . Pseudoeph-Doxylamine-Dm-Apap Hives  . Pseudoephedrine Hives  . Sulfonamide Derivatives Hives  . Tetracycline Hives  . Prednisone Rash    Family History  Problem Relation Age of Onset  . Hypertension Other   . Diabetes Other      Prior to Admission medications   Medication Sig Start Date End Date Taking? Authorizing Provider  allopurinol (ZYLOPRIM) 100 MG tablet Take 1 tablet (100 mg total) by mouth daily. Must keep visit on 11/03/17 to get refills Patient taking differently: Take 100 mg by mouth daily as needed (gout). Must keep visit on 11/03/17 to get refills 11/03/17  Yes Plotnikov, Georgina QuintAleksei V, MD  Cholecalciferol (VITAMIN D3) 1000 units CAPS Take 1,000 Units by mouth daily.   Yes [provider]  ferrous sulfate 325 (65 FE) MG EC tablet Take 325 mg by mouth daily.   Yes [provider]  hydrochlorothiazide (HYDRODIURIL) 25 MG tablet Take 1 tablet (25 mg total) by mouth daily. Must keep visit on 11/03/17 to get refills 11/03/17  Yes Plotnikov, Georgina QuintAleksei V, MD  Multiple Vitamins-Minerals (MULTIVITAMIN ADULTS PO) Take  1 tablet by mouth daily.   Yes [provider]  Omeprazole-Sodium Bicarbonate (ZEGERID OTC PO) Take 1 capsule by mouth daily.   Yes [provider]  potassium chloride SA (K-DUR,KLOR-CON) 20 MEQ tablet Take 1 tablet (20 mEq total) by mouth daily. 11/03/17  Yes Plotnikov, Georgina Quint, MD  colchicine 0.6 MG tablet Take 1 tablet (0.6 mg total) by mouth 2 (two) times daily as needed. Gout attacks 11/03/17   Plotnikov, Georgina Quint, MD  diazepam (VALIUM) 5 MG tablet take 1 tablet by mouth every 12 hours if needed for anxiety 11/09/17   Plotnikov, Georgina Quint, MD  fluconazole (DIFLUCAN) 100 MG tablet Take 2 tabs on day#1, then 1 tab  daily on Days #2-10 11/03/17   Plotnikov, Georgina Quint, MD  metroNIDAZOLE (FLAGYL) 500 MG tablet Take 1 tablet (500 mg total) by mouth 3 (three) times daily. 11/03/17   Plotnikov, Georgina Quint, MD    Physical Exam: Vitals:   11/27/17 2151 11/27/17 2200 11/28/17 0000 11/28/17 0045  BP: (!) 158/103 (!) 150/95 131/85 125/80  Pulse: 93 84 83 86  Resp: 17 16 15 12   Temp:      TempSrc:      SpO2: 100% 94% 97% 96%  Weight:      Height:        Constitutional: NAD, calm, comfortable Eyes: PERRL, lids and conjunctivae normal ENMT: Mucous membranes are moist. Posterior pharynx clear of any exudate or lesions.Normal dentition.  Neck: normal, supple, no masses, no thyromegaly Respiratory: clear to auscultation bilaterally, no wheezing, no crackles. Normal respiratory effort. No accessory muscle use.  Cardiovascular: Regular rate and rhythm, no murmurs / rubs / gallops. No extremity edema. 2+ pedal pulses. No carotid bruits.  Abdomen: Minimal tenderness. Musculoskeletal: no clubbing / cyanosis. No joint deformity upper and lower extremities. Good ROM, no contractures. Normal muscle tone.  Skin: no rashes, lesions, ulcers. No induration Neurologic: CN 2-12 grossly intact. Sensation intact, DTR normal. Strength 5/5 in all 4.  Psychiatric: Normal judgment and insight. Alert and oriented x 3. Normal mood.    Labs on Admission: I have personally reviewed following labs and imaging studies  CBC: Recent Labs  Lab 11/27/17 1655  WBC 20.7*  HGB 15.0  HCT 42.7  MCV 85.4  PLT 349   Basic Metabolic Panel: Recent Labs  Lab 11/27/17 1655  NA 140  K 3.4*  CL 104  CO2 19*  GLUCOSE 131*  BUN 11  CREATININE 0.83  CALCIUM 10.0   GFR: Estimated Creatinine Clearance: 75.7 mL/min (by C-G formula based on SCr of 0.83 mg/dL). Liver Function Tests: Recent Labs  Lab 11/27/17 1655  AST 35  ALT 17  ALKPHOS 65  BILITOT 1.1  PROT 8.0  ALBUMIN 4.6   Recent Labs  Lab 11/27/17 1655  LIPASE 22   No  results for input(s): AMMONIA in the last 168 hours. Coagulation Profile: No results for input(s): INR, PROTIME in the last 168 hours. Cardiac Enzymes: No results for input(s): CKTOTAL, CKMB, CKMBINDEX, TROPONINI in the last 168 hours. BNP (last 3 results) No results for input(s): PROBNP in the last 8760 hours. HbA1C: No results for input(s): HGBA1C in the last 72 hours. CBG: No results for input(s): GLUCAP in the last 168 hours. Lipid Profile: No results for input(s): CHOL, HDL, LDLCALC, TRIG, CHOLHDL, LDLDIRECT in the last 72 hours. Thyroid Function Tests: No results for input(s): TSH, T4TOTAL, FREET4, T3FREE, THYROIDAB in the last 72 hours. Anemia Panel: No results for input(s): VITAMINB12, FOLATE,  FERRITIN, TIBC, IRON, RETICCTPCT in the last 72 hours. Urine analysis:    Component Value Date/Time   COLORURINE YELLOW 11/27/2017 2137   APPEARANCEUR CLEAR 11/27/2017 2137   LABSPEC 1.013 11/27/2017 2137   PHURINE 8.0 11/27/2017 2137   GLUCOSEU NEGATIVE 11/27/2017 2137   GLUCOSEU NEGATIVE 10/14/2016 1033   HGBUR MODERATE (A) 11/27/2017 2137   BILIRUBINUR NEGATIVE 11/27/2017 2137   KETONESUR 20 (A) 11/27/2017 2137   PROTEINUR 100 (A) 11/27/2017 2137   UROBILINOGEN 0.2 10/14/2016 1033   NITRITE NEGATIVE 11/27/2017 2137   LEUKOCYTESUR NEGATIVE 11/27/2017 2137    Radiological Exams on Admission: US Pelvis Complete  Result Date: 11/28/2017 CLINICAL DATA:  Initial evaluation for acute mid lower pelvic pain. EXAM: TRANSABDOMINAL ULTRASOUND OF PELVIS DOPPLER ULTRASOUND OF OVARIES TECHNIQUE: Transabdominal ultrasound examination of the pelvis was performed including evaluation of the uterus, ovaries, adnexal regions, and pelvic cul-de-sac. Color and duplex Doppler ultrasound was utilized to evaluate blood flow to the ovaries. COMPARISON:  Prior CT from earlier the same day. FINDINGS: Uterus Measurements: 7.4 x 3.4 x 4.0 cm. No fibroids or other mass visualized. Endometrium Thickness: 6.7  mm.  No focal abnormality visualized. Right ovary Measurements: 3.7 x 2.4 x 2.7 cm. Normal appearance/no adnexal mass. Left ovary Measurements: 4.4 x 2.2 x 3.6 cm. Normal appearance/no adnexal mass. Pulsed Doppler evaluation of both ovaries demonstrates normal low-resistance arterial and venous waveforms. Other findings No abnormal free fluid. IMPRESSION: Normal pelvic ultrasound. No evidence for torsion or other acute abnormality. Electronically Signed   By: Rise Mu M.D.   On: 11/28/2017 00:25   Ct Abdomen Pelvis W Contrast  Result Date: 11/27/2017 CLINICAL DATA:  Abdominal pain. Vomiting. Small amount of diarrhea. EXAM: CT ABDOMEN AND PELVIS WITH CONTRAST TECHNIQUE: Multidetector CT imaging of the abdomen and pelvis was performed using the standard protocol following bolus administration of intravenous contrast. CONTRAST:  ISOVUE-300 IOPAMIDOL (ISOVUE-300) INJECTION 61% COMPARISON:  September 12, 2012 FINDINGS: Lower chest: Emphysematous changes in the bases. Scattered atelectasis. Moderate hiatal hernia. The distal esophagus is fluid-filled and mildly distended. Lung bases otherwise unremarkable. Hepatobiliary: Hepatic steatosis. The gallbladder is normal. No liver mass. The portal vein remains patent. Pancreas: There is increased attenuation of fat surrounding the pancreas. The process appears to be centered around the pancreas both anteriorly and posteriorly but there is a thin rim of fat attenuation between this material in the pancreas itself along much of its course. The pancreatic parenchyma is for the most part not definitively inflamed. Normal enhancement of the pancreas identified. Spleen: Normal in size without focal abnormality. Adrenals/Urinary Tract: Adrenal glands are unremarkable. Kidneys are normal, without renal calculi, focal lesion, or hydronephrosis. Bladder is unremarkable. Stomach/Bowel: There is a moderate hiatal hernia. The remainder of the stomach is normal. The  duodenum is normal in appearance. The remainder of the small bowel is unremarkable. Scattered colonic diverticuli are seen without diverticulitis. No colonic inflammation noted. The appendix is normal. Vascular/Lymphatic: The aorta demonstrates no atherosclerosis or aneurysm. No adenopathy. The high attenuation material adjacent to the pancreas also follows the proximal course of celiac artery branches and the portal vein. There is mild resulting narrowing of the proximal celiac artery. Reproductive: Uterus and bilateral adnexa are unremarkable. Other: There is free fluid in the pelvis. There is a rind of increased attenuation in the fat adjacent to the pancreas as above. There is some fat stranding in the mesentery and some fluid tracking to the left into the left pericolic gutter. No free air. Musculoskeletal:  No acute or significant osseous findings. IMPRESSION: 1. There is increased attenuation in the fat surrounding the pancreas. There does appear to be a thin preserved fat plane between this material and the pancreas itself and the pancreas is not definitively inflamed in appearance. Based on imaging alone, I would suspect pancreatitis. However, the patient's lipase is normal. Consider an autoimmune pancreatitis which can be seen without an elevated lipase. The high attenuation material around the pancreas also tracks along branches of the celiac artery with some mild narrowing of the celiac artery proximally. This raises the possibility of a vasculitis although I think this is less likely as the bulk of the abnormality appears to be centered around the pancreas. Other unusual etiology such as lymphoma should be considered. The patient may benefit from a abdominal MRI. 2. Emphysematous changes in the lung bases. 3. Moderate hiatal hernia. The distal esophagus above the hernia is fluid-filled and mildly distended. Recommend clinical correlation. An upper GI could better evaluate if there is concern for  obstruction. 4. Diverticulosis without diverticulitis in the colon. Electronically Signed   By: Gerome Sam III M.D   On: 11/27/2017 23:55   [place] Art/ven Flow Abd Pelv Doppler  Result Date: 11/28/2017 CLINICAL DATA:  Initial evaluation for acute mid lower pelvic pain. EXAM: TRANSABDOMINAL ULTRASOUND OF PELVIS DOPPLER ULTRASOUND OF OVARIES TECHNIQUE: Transabdominal ultrasound examination of the pelvis was performed including evaluation of the uterus, ovaries, adnexal regions, and pelvic cul-de-sac. Color and duplex Doppler ultrasound was utilized to evaluate blood flow to the ovaries. COMPARISON:  Prior CT from earlier the same day. FINDINGS: Uterus Measurements: 7.4 x 3.4 x 4.0 cm. No fibroids or other mass visualized. Endometrium Thickness: 6.7 mm.  No focal abnormality visualized. Right ovary Measurements: 3.7 x 2.4 x 2.7 cm. Normal appearance/no adnexal mass. Left ovary Measurements: 4.4 x 2.2 x 3.6 cm. Normal appearance/no adnexal mass. Pulsed Doppler evaluation of both ovaries demonstrates normal low-resistance arterial and venous waveforms. Other findings No abnormal free fluid. IMPRESSION: Normal pelvic ultrasound. No evidence for torsion or other acute abnormality. Electronically Signed   By: Rise Mu M.D.   On: 11/28/2017 00:25    EKG: Independently reviewed.  Assessment/Plan Principal Problem:   Acute pancreatitis Active Problems:   Essential hypertension   Barrett's esophagus    1. Acute pancreatitis - 1. Acute pancreatitis on CT scan, but lipase negative. 2. ? Autoimmune? 3. 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 4. Checking repeat CBC/CMP in AM 5. Empiric zosyn given fever and leukocytosis 6. Clear liquids 7. GI consult in AM (overdue for EGD for Barrett's esophagus anyhow). 8. IVF 2. HTN - hold HCTZ 3. Barrett's esophagus - 1. GI consult  in AM, overdue for EGD 2. Especially with the fluid filled esophagus above her hiatal hernia on CT scan...  DVT prophylaxis: Lovenox Code Status: Full Family Communication: Sister at bedside Disposition Plan: Home after admit Consults called: None, call GI in AM Admission status: Admit to inpatient   Hillary Bow DO [facility] Pager [phone]  If 7AM-7PM, please contact day team taking care of patient [email or web address] Password [email or web address]  11/28/2017, 1:25 AM

## 2017-11-28 NOTE — Consult Note (Addendum)
Referring Provider:  TH Primary Care Physician:  Tresa Garter, MD Primary Gastroenterologist:  Dr. Madilyn Fireman  Reason for Consultation:  Abnormal CT scan, pancreatitis  HPI: [name] is a 47 y.o. female with past medical history of long segment Barrett's esophagus is to follow Dr. Rulon Abide of PE/DVT currently not on anticoagulation, history of congenital cystic disease of the lung ,history of chronic constipation presented to the hospital with abdominal pain, nausea and vomiting.  Patient was doing fine until this weekend when she started noticing some stomach upset.She developed sharp abdominal cramps in the lower abdomen followed by episodes of nausea and vomiting.Multiple episode since yesterday. Denied any blood in the vomiting. Denied any diarrhea.Her abdominal cramps have resolved now. No further vomiting. Nausea is well controlled with Phenergan at this time.she denied any epigastric abdominal pain to me.  Denied any black stool or bright blood per rectum. Denied any trouble swallowing or pain while swallowing. Chronic acid reflux is well controlled with over-the-counter medications currently. She has tried Prilosec in the past.  Last EGD in 2010. No previous colonoscopy. No family history of colon cancer.  Past Medical History:  Diagnosis Date  . Allergic rhinitis   . Anxiety   . Asthma   . Congenital cystic disease of lung   . Constipation   . Depression   . GERD (gastroesophageal reflux disease)   . Headache, migraine   . History of DVT of lower extremity   . Onychomycosis   . Palpitations   . Personal history of PE (pulmonary embolism)   . Tinea versicolor     Past Surgical History:  Procedure Laterality Date  . LUNG LOBECTOMY     RL  . TUBAL LIGATION      Prior to Admission medications   Medication Sig Start Date End Date Taking? Authorizing Provider  allopurinol (ZYLOPRIM) 100 MG tablet Take 1 tablet (100 mg total) by mouth daily. Must keep visit on  11/03/17 to get refills Patient taking differently: Take 100 mg by mouth daily as needed (gout). Must keep visit on 11/03/17 to get refills 11/03/17  Yes Plotnikov, Georgina Quint, MD  Cholecalciferol (VITAMIN D3) 1000 units CAPS Take 1,000 Units by mouth daily.   Yes [provider]  ferrous sulfate 325 (65 FE) MG EC tablet Take 325 mg by mouth daily.   Yes [provider]  hydrochlorothiazide (HYDRODIURIL) 25 MG tablet Take 1 tablet (25 mg total) by mouth daily. Must keep visit on 11/03/17 to get refills 11/03/17  Yes Plotnikov, Georgina Quint, MD  Multiple Vitamins-Minerals (MULTIVITAMIN ADULTS PO) Take 1 tablet by mouth daily.   Yes [provider]  Omeprazole-Sodium Bicarbonate (ZEGERID OTC PO) Take 1 capsule by mouth daily.   Yes [provider]  potassium chloride SA (K-DUR,KLOR-CON) 20 MEQ tablet Take 1 tablet (20 mEq total) by mouth daily. 11/03/17  Yes Plotnikov, Georgina Quint, MD  colchicine 0.6 MG tablet Take 1 tablet (0.6 mg total) by mouth 2 (two) times daily as needed. Gout attacks 11/03/17   Plotnikov, Georgina Quint, MD  diazepam (VALIUM) 5 MG tablet take 1 tablet by mouth every 12 hours if needed for anxiety 11/09/17   Plotnikov, Georgina Quint, MD  fluconazole (DIFLUCAN) 100 MG tablet Take 2 tabs on day#1, then 1 tab daily on Days #2-10 11/03/17   Plotnikov, Georgina Quint, MD  metroNIDAZOLE (FLAGYL) 500 MG tablet Take 1 tablet (500 mg total) by mouth 3 (three) times daily. 11/03/17   Plotnikov, Georgina Quint, MD  Scheduled Meds: . cholecalciferol  1,000 Units Oral Daily  . enoxaparin (LOVENOX) injection  40 mg Subcutaneous Q24H  . ferrous sulfate  325 mg Oral Daily  . omeprazole  20 mg Oral QAC breakfast   Continuous Infusions: . sodium chloride 100 mL/hr at 11/28/17 0450   PRN Meds:.acetaminophen **OR** acetaminophen, diazepam, fentaNYL (SUBLIMAZE) injection, ondansetron **OR** ondansetron (ZOFRAN) IV  Allergies as of 11/27/2017 - Review Complete 11/27/2017  Allergen  Reaction Noted  . Guaifenesin Shortness Of Breath 09/09/2008  . Iron Anaphylaxis 03/22/2012  . Amoxapine and related  02/16/2013  . Codeine Other (See Comments)   . Doxepin Hives 03/22/2012  . Hydromorphone hcl Nausea And Vomiting 09/09/2008  . Metoprolol succinate  12/24/2008  . Morphine Other (See Comments) 09/09/2008  . Pantoprazole sodium Other (See Comments) 09/09/2008  . Pseudoeph-doxylamine-dm-apap Hives   . Pseudoephedrine Hives 09/09/2008  . Sulfonamide derivatives Hives   . Tetracycline Hives 09/09/2008  . Prednisone Rash 09/09/2008    Family History  Problem Relation Age of Onset  . Hypertension Other   . Diabetes Other     Social History   Socioeconomic History  . Marital status: Single    Spouse name: Not on file  . Number of children: Not on file  . Years of education: Not on file  . Highest education level: Not on file  Occupational History  . Not on file  Social Needs  . Financial resource strain: Not on file  . Food insecurity:    Worry: Not on file    Inability: Not on file  . Transportation needs:    Medical: Not on file    Non-medical: Not on file  Tobacco Use  . Smoking status: Never Smoker  . Smokeless tobacco: Never Used  Substance and Sexual Activity  . Alcohol use: Yes    Comment: seldom  . Drug use: No  . Sexual activity: Not on file  Lifestyle  . Physical activity:    Days per week: Not on file    Minutes per session: Not on file  . Stress: Not on file  Relationships  . Social connections:    Talks on phone: Not on file    Gets together: Not on file    Attends religious service: Not on file    Active member of club or organization: Not on file    Attends meetings of clubs or organizations: Not on file    Relationship status: Not on file  . Intimate partner violence:    Fear of current or ex partner: Not on file    Emotionally abused: Not on file    Physically abused: Not on file    Forced sexual activity: Not on file  Other  Topics Concern  . Not on file  Social History Narrative  . Not on file    Review of Systems: Review of Systems  Constitutional: Negative for chills and fever.  HENT: Negative for hearing loss and tinnitus.   Eyes: Negative for blurred vision and double vision.  Respiratory: Negative for cough, hemoptysis and sputum production.   Cardiovascular: Negative for chest pain and palpitations.  Gastrointestinal: Positive for abdominal pain, constipation, heartburn, nausea and vomiting. Negative for blood in stool, diarrhea and melena.  Genitourinary: Negative for dysuria and urgency.  Musculoskeletal: Negative for myalgias and neck pain.  Skin: Negative for itching and rash.  Neurological: Negative for seizures and loss of consciousness.  Endo/Heme/Allergies: Negative for environmental allergies. Does not bruise/bleed easily.  Psychiatric/Behavioral: Negative for hallucinations  and suicidal ideas.    Physical Exam: Vital signs: Vitals:   11/28/17 0300 11/28/17 0419  BP: 108/68 132/87  Pulse: 85 85  Resp: (!) 9 14  Temp:  98.4 F (36.9 C)  SpO2: 96% 96%   Last BM Date: 11/26/17 Physical Exam  Constitutional: She is oriented to person, place, and time. She appears well-developed and well-nourished. No distress.  HENT:  Head: Normocephalic and atraumatic.  Mouth/Throat: Oropharynx is clear and moist. No oropharyngeal exudate.  Eyes: No scleral icterus.  Neck: Normal range of motion. Neck supple. No thyromegaly present.  Cardiovascular: Normal rate, regular rhythm and normal heart sounds.  Pulmonary/Chest: Effort normal and breath sounds normal. No respiratory distress.  Abdominal: Soft. Bowel sounds are normal. She exhibits no distension. There is no tenderness. There is no rebound and no guarding.  Musculoskeletal: Normal range of motion. She exhibits no edema.  Neurological: She is alert and oriented to person, place, and time.  Skin: Skin is warm. No erythema.  Psychiatric: She  has a normal mood and affect. Judgment and thought content normal.  Nursing note and vitals reviewed.  GI:  Lab Results: Recent Labs    11/27/17 1655 11/28/17 0434  WBC 20.7* 20.8*  HGB 15.0 12.6  HCT 42.7 37.5  PLT 349 340   BMET Recent Labs    11/27/17 1655 11/28/17 0434  NA 140 143  K 3.4* 2.9*  CL 104 105  CO2 19* 28  GLUCOSE 131* 96  BUN 11 12  CREATININE 0.83 0.77  CALCIUM 10.0 9.2   LFT Recent Labs    11/28/17 0434  PROT 7.5  ALBUMIN 4.2  AST 24  ALT 18  ALKPHOS 55  BILITOT 0.9   PT/INR No results for input(s): LABPROT, INR in the last 72 hours.   Studies/Results: US Pelvis Complete  Result Date: 11/28/2017 CLINICAL DATA:  Initial evaluation for acute mid lower pelvic pain. EXAM: TRANSABDOMINAL ULTRASOUND OF PELVIS DOPPLER ULTRASOUND OF OVARIES TECHNIQUE: Transabdominal ultrasound examination of the pelvis was performed including evaluation of the uterus, ovaries, adnexal regions, and pelvic cul-de-sac. Color and duplex Doppler ultrasound was utilized to evaluate blood flow to the ovaries. COMPARISON:  Prior CT from earlier the same day. FINDINGS: Uterus Measurements: 7.4 x 3.4 x 4.0 cm. No fibroids or other mass visualized. Endometrium Thickness: 6.7 mm.  No focal abnormality visualized. Right ovary Measurements: 3.7 x 2.4 x 2.7 cm. Normal appearance/no adnexal mass. Left ovary Measurements: 4.4 x 2.2 x 3.6 cm. Normal appearance/no adnexal mass. Pulsed Doppler evaluation of both ovaries demonstrates normal low-resistance arterial and venous waveforms. Other findings No abnormal free fluid. IMPRESSION: Normal pelvic ultrasound. No evidence for torsion or other acute abnormality. Electronically Signed   By: Rise Mu M.D.   On: 11/28/2017 00:25   Ct Abdomen Pelvis W Contrast  Result Date: 11/27/2017 CLINICAL DATA:  Abdominal pain. Vomiting. Small amount of diarrhea. EXAM: CT ABDOMEN AND PELVIS WITH CONTRAST TECHNIQUE: Multidetector CT imaging of the  abdomen and pelvis was performed using the standard protocol following bolus administration of intravenous contrast. CONTRAST:  ISOVUE-300 IOPAMIDOL (ISOVUE-300) INJECTION 61% COMPARISON:  September 12, 2012 FINDINGS: Lower chest: Emphysematous changes in the bases. Scattered atelectasis. Moderate hiatal hernia. The distal esophagus is fluid-filled and mildly distended. Lung bases otherwise unremarkable. Hepatobiliary: Hepatic steatosis. The gallbladder is normal. No liver mass. The portal vein remains patent. Pancreas: There is increased attenuation of fat surrounding the pancreas. The process appears to be centered around the pancreas both anteriorly  and posteriorly but there is a thin rim of fat attenuation between this material in the pancreas itself along much of its course. The pancreatic parenchyma is for the most part not definitively inflamed. Normal enhancement of the pancreas identified. Spleen: Normal in size without focal abnormality. Adrenals/Urinary Tract: Adrenal glands are unremarkable. Kidneys are normal, without renal calculi, focal lesion, or hydronephrosis. Bladder is unremarkable. Stomach/Bowel: There is a moderate hiatal hernia. The remainder of the stomach is normal. The duodenum is normal in appearance. The remainder of the small bowel is unremarkable. Scattered colonic diverticuli are seen without diverticulitis. No colonic inflammation noted. The appendix is normal. Vascular/Lymphatic: The aorta demonstrates no atherosclerosis or aneurysm. No adenopathy. The high attenuation material adjacent to the pancreas also follows the proximal course of celiac artery branches and the portal vein. There is mild resulting narrowing of the proximal celiac artery. Reproductive: Uterus and bilateral adnexa are unremarkable. Other: There is free fluid in the pelvis. There is a rind of increased attenuation in the fat adjacent to the pancreas as above. There is some fat stranding in the mesentery and  some fluid tracking to the left into the left pericolic gutter. No free air. Musculoskeletal: No acute or significant osseous findings. IMPRESSION: 1. There is increased attenuation in the fat surrounding the pancreas. There does appear to be a thin preserved fat plane between this material and the pancreas itself and the pancreas is not definitively inflamed in appearance. Based on imaging alone, I would suspect pancreatitis. However, the patient's lipase is normal. Consider an autoimmune pancreatitis which can be seen without an elevated lipase. The high attenuation material around the pancreas also tracks along branches of the celiac artery with some mild narrowing of the celiac artery proximally. This raises the possibility of a vasculitis although I think this is less likely as the bulk of the abnormality appears to be centered around the pancreas. Other unusual etiology such as lymphoma should be considered. The patient may benefit from a abdominal MRI. 2. Emphysematous changes in the lung bases. 3. Moderate hiatal hernia. The distal esophagus above the hernia is fluid-filled and mildly distended. Recommend clinical correlation. An upper GI could better evaluate if there is concern for obstruction. 4. Diverticulosis without diverticulitis in the colon. Electronically Signed   By: Gerome Sam III M.D   On: 11/27/2017 23:55   [place] Art/ven Flow Abd Pelv Doppler  Result Date: 11/28/2017 CLINICAL DATA:  Initial evaluation for acute mid lower pelvic pain. EXAM: TRANSABDOMINAL ULTRASOUND OF PELVIS DOPPLER ULTRASOUND OF OVARIES TECHNIQUE: Transabdominal ultrasound examination of the pelvis was performed including evaluation of the uterus, ovaries, adnexal regions, and pelvic cul-de-sac. Color and duplex Doppler ultrasound was utilized to evaluate blood flow to the ovaries. COMPARISON:  Prior CT from earlier the same day. FINDINGS: Uterus Measurements: 7.4 x 3.4 x 4.0 cm. No fibroids or other mass visualized.  Endometrium Thickness: 6.7 mm.  No focal abnormality visualized. Right ovary Measurements: 3.7 x 2.4 x 2.7 cm. Normal appearance/no adnexal mass. Left ovary Measurements: 4.4 x 2.2 x 3.6 cm. Normal appearance/no adnexal mass. Pulsed Doppler evaluation of both ovaries demonstrates normal low-resistance arterial and venous waveforms. Other findings No abnormal free fluid. IMPRESSION: Normal pelvic ultrasound. No evidence for torsion or other acute abnormality. Electronically Signed   By: Rise Mu M.D.   On: 11/28/2017 00:25    Impression/Plan: - nausea and vomiting. Improving. Etiology unclear but could be related to gastritis versus related to her moderate-sized hiatal hernia. - Lower  abdominal cramps. Improving. 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 Patient denied any epigastric pain. Normal lipase.there is also concern for lymphoma and MRI of the abdomen is recommended by radiologist. - H/O  of Barrett's esophagus. - chronic constipation. Last bowel movement 2 days ago - leukocytosis. Most likely reactive. - chronic GERD  Recommendations -------------------------- - patient's symptoms are improving. Her presentation is not consistent with pancreatitis given lack of epigastric pain and normal lipase. - mild distention of the esophagus could be from recent nausea and vomiting. - she is willing try full liquid diet. If no improvement in symptoms, consider upper GI series for further evaluation. - if she continues to do well, recommend outpatient EGD as well as MRI MRCP for further evaluation,  probably in 4-6 weeks. - increase omeprazole to 40 mg once a day. - check IgG4.  - no indication for antibiotics from GI standpoint. - recheck labs in the morning. GI will follow.   LOS: 0 days   Kathi DerParag Jyron Turman  MD, FACP 11/28/2017, 9:10 AM  Contact #  [phone]

## 2017-11-28 NOTE — Progress Notes (Signed)
Pharmacy Antibiotic Note  Carla EvesBrandi D Gordon is a 47 y.o. female admitted on 11/27/2017 with intra-abdominal infection.  Pharmacy has been consulted for zosyn dosing.  Plan: Zosyn 3.375g IV q8h (4 hour infusion).  F/u scr/cultures  Height: 5' 7.5" (171.5 cm) Weight: 126 lb (57.2 kg) IBW/kg (Calculated) : 62.75  Temp (24hrs), Avg:99.3 F (37.4 C), Min:98 F (36.7 C), Max:100.5 F (38.1 C)  Recent Labs  Lab 11/27/17 1655 11/27/17 2358  WBC 20.7*  --   CREATININE 0.83  --   LATICACIDVEN  --  2.05*    Estimated Creatinine Clearance: 75.7 mL/min (by C-G formula based on SCr of 0.83 mg/dL).    Allergies  Allergen Reactions  . Guaifenesin Shortness Of Breath  . Iron Anaphylaxis    IV only  . Amoxapine And Related   . Codeine Other (See Comments)    tiredness  . Doxepin Hives  . Hydromorphone Hcl Nausea And Vomiting  . Metoprolol Succinate     REACTION: edema  . Morphine Other (See Comments)    Does not work  . Pantoprazole Sodium Other (See Comments)    heartburn  . Pseudoeph-Doxylamine-Dm-Apap Hives  . Pseudoephedrine Hives  . Sulfonamide Derivatives Hives  . Tetracycline Hives  . Prednisone Rash    Antimicrobials this admission: 6/11 zosyn >>    >>   Dose adjustments this admission:   Microbiology results:  BCx:   UCx:    Sputum:    MRSA PCR:   Thank you for allowing pharmacy to be a part of this patient's care.  Lorenza EvangelistGreen, Kaziah Krizek R 11/28/2017 1:03 AM

## 2017-11-28 NOTE — Progress Notes (Signed)
Patient ID: [name], female   DOB: 10/06/1970, 47 y.o.   MRN: [id number] Patient was admitted early this morning for abdominal pain with nausea and vomiting with CT abdomen showing probable acute parotitis.  She was also started on intravenous Zosyn because of leukocytosis and low-grade temperature.  Patient seen and examined at bedside.  Currently she states that her pain is 3 out of 10 and she is tolerating clear liquid diet.  Patient's medical records were reviewed by myself.  And of care discussed with the patient.  I have discussed the case with Dr. Marjorie SmolderBrahmbhatt/GI and he will evaluate the patient as well.  Continue IV fluids, pain management, clear liquid diet.  Discontinue Zosyn.  Leukocytosis and low-grade temperature might be reactive from pancolitis.  Follow cultures.  Repeat a.m. labs.

## 2017-11-28 NOTE — ED Notes (Signed)
[facility] gave critical I Stat CG4 results [facility]

## 2017-11-29 ENCOUNTER — Encounter (HOSPITAL_COMMUNITY): Payer: Self-pay | Admitting: *Deleted

## 2017-11-29 ENCOUNTER — Inpatient Hospital Stay (HOSPITAL_COMMUNITY): Payer: Managed Care, Other (non HMO) | Admitting: Anesthesiology

## 2017-11-29 ENCOUNTER — Encounter (HOSPITAL_COMMUNITY)
Admission: EM | Disposition: A | Discharge status: Home / Self Care | Payer: Self-pay | Source: Home / Self Care | Attending: Internal Medicine

## 2017-11-29 DIAGNOSIS — R1114 Bilious vomiting: Secondary | ICD-10-CM

## 2017-11-29 DIAGNOSIS — K2270 Barrett's esophagus without dysplasia: Secondary | ICD-10-CM

## 2017-11-29 DIAGNOSIS — I10 Essential (primary) hypertension: Secondary | ICD-10-CM

## 2017-11-29 HISTORY — PX: BIOPSY: SHX5522

## 2017-11-29 HISTORY — PX: ESOPHAGOGASTRODUODENOSCOPY (EGD) WITH PROPOFOL: SHX5813

## 2017-11-29 LAB — COMPREHENSIVE METABOLIC PANEL
ALT: 17 U/L (ref 14–54)
AST: 20 U/L (ref 15–41)
Albumin: 3.7 g/dL (ref 3.5–5.0)
Alkaline Phosphatase: 46 U/L (ref 38–126)
Anion gap: 8 (ref 5–15)
BILIRUBIN TOTAL: 0.7 mg/dL (ref 0.3–1.2)
BUN: 6 mg/dL (ref 6–20)
CO2: 25 mmol/L (ref 22–32)
CREATININE: 0.68 mg/dL (ref 0.44–1.00)
Calcium: 8.3 mg/dL — ABNORMAL LOW (ref 8.9–10.3)
Chloride: 110 mmol/L (ref 101–111)
GFR calc Af Amer: >60 mL/min (ref >60)
GFR calc non Af Amer: >60 mL/min (ref >60)
Glucose, Bld: 98 mg/dL (ref 65–99)
POTASSIUM: 3.2 mmol/L — AB (ref 3.5–5.1)
Sodium: 143 mmol/L (ref 135–145)
Total Protein: 6.5 g/dL (ref 6.5–8.1)

## 2017-11-29 LAB — CBC WITH DIFFERENTIAL/PLATELET
BASOS ABS: 0 10*3/uL (ref 0.0–0.1)
Basophils Relative: 0 %
Eosinophils Absolute: 0.1 10*3/uL (ref 0.0–0.7)
Eosinophils Relative: 1 %
HCT: 34 % — ABNORMAL LOW (ref 36.0–46.0)
Hemoglobin: 11.3 g/dL — ABNORMAL LOW (ref 12.0–15.0)
LYMPHS PCT: 14 %
Lymphs Abs: 2.4 10*3/uL (ref 0.7–4.0)
MCH: 29.4 pg (ref 26.0–34.0)
MCHC: 33.2 g/dL (ref 30.0–36.0)
MCV: 88.3 fL (ref 78.0–100.0)
MONO ABS: 0.8 10*3/uL (ref 0.1–1.0)
MONOS PCT: 5 %
NEUTROS ABS: 14.5 10*3/uL — AB (ref 1.7–7.7)
Neutrophils Relative %: 80 %
Platelets: 291 10*3/uL (ref 150–400)
RBC: 3.85 MIL/uL — ABNORMAL LOW (ref 3.87–5.11)
RDW: 13.1 % (ref 11.5–15.5)
WBC: 17.9 10*3/uL — ABNORMAL HIGH (ref 4.0–10.5)

## 2017-11-29 LAB — IGG 4: IgG, Subclass 4: 7 mg/dL (ref 2–96)

## 2017-11-29 LAB — MAGNESIUM: MAGNESIUM: 1.6 mg/dL — AB (ref 1.7–2.4)

## 2017-11-29 SURGERY — ESOPHAGOGASTRODUODENOSCOPY (EGD) WITH PROPOFOL
Anesthesia: Monitor Anesthesia Care

## 2017-11-29 MED ORDER — LACTATED RINGERS IV SOLN
INTRAVENOUS | Status: DC
  Administered 2017-11-29: 14:00:00 via INTRAVENOUS

## 2017-11-29 MED ORDER — POTASSIUM CHLORIDE CRYS ER 20 MEQ PO TBCR
40.0000 meq | EXTENDED_RELEASE_TABLET | ORAL | Status: AC
  Administered 2017-11-29: 40 meq via ORAL
  Filled 2017-11-29 (×2): qty 2

## 2017-11-29 MED ORDER — PROPOFOL 10 MG/ML IV BOLUS
INTRAVENOUS | Status: DC | PRN
  Administered 2017-11-29 (×2): 40 mg via INTRAVENOUS
  Administered 2017-11-29: 20 mg via INTRAVENOUS
  Administered 2017-11-29: 40 mg via INTRAVENOUS
  Administered 2017-11-29 (×2): 20 mg via INTRAVENOUS

## 2017-11-29 MED ORDER — PROPOFOL 10 MG/ML IV BOLUS
INTRAVENOUS | Status: AC
Start: 2017-11-29 — End: ?
  Filled 2017-11-29: qty 40

## 2017-11-29 MED ORDER — ONDANSETRON HCL 4 MG/2ML IJ SOLN
INTRAMUSCULAR | Status: DC | PRN
  Administered 2017-11-29: 4 mg via INTRAVENOUS

## 2017-11-29 MED ORDER — ALUM & MAG HYDROXIDE-SIMETH 200-200-20 MG/5ML PO SUSP: 30.0000 mL | Freq: Four times a day (QID) | ORAL | Status: DC | PRN

## 2017-11-29 MED ORDER — FAMOTIDINE 20 MG PO TABS
20.0000 mg | ORAL_TABLET | Freq: Two times a day (BID) | ORAL | Status: DC | PRN
  Administered 2017-11-29: 20 mg via ORAL
  Filled 2017-11-29 (×2): qty 1

## 2017-11-29 MED ORDER — ALLOPURINOL 100 MG PO TABS
100.0000 mg | ORAL_TABLET | Freq: Every day | ORAL | Status: DC
  Administered 2017-11-29 – 2017-11-30 (×2): 100 mg via ORAL
  Filled 2017-11-29 (×2): qty 1

## 2017-11-29 MED ORDER — PROPOFOL 500 MG/50ML IV EMUL
INTRAVENOUS | Status: DC | PRN
  Administered 2017-11-29: 100 ug/kg/min via INTRAVENOUS

## 2017-11-29 MED ORDER — PROMETHAZINE HCL 25 MG/ML IJ SOLN
12.5000 mg | Freq: Four times a day (QID) | INTRAMUSCULAR | Status: DC | PRN
  Administered 2017-11-29: 12.5 mg via INTRAVENOUS
  Filled 2017-11-29: qty 1

## 2017-11-29 MED ORDER — SODIUM CHLORIDE 0.9 % IV SOLN: INTRAVENOUS | Status: DC

## 2017-11-29 MED ORDER — MAGNESIUM SULFATE 2 GM/50ML IV SOLN
2.0000 g | Freq: Once | INTRAVENOUS | Status: AC
  Administered 2017-11-29: 2 g via INTRAVENOUS
  Filled 2017-11-29: qty 50

## 2017-11-29 SURGICAL SUPPLY — 14 items

## 2017-11-29 NOTE — Op Note (Signed)
[facility] Patient Name: Carla StarchBrandi Gordon Procedure Date: 11/29/2017 MRN: [id number] Attending MD: Carla RiggerMarc Jessikah Gordon , MD Date of Birth: 04/22/1971 CSN: [id number] Age: [age] Admit Type: Inpatient Procedure:                Upper GI endoscopy Indications:              Epigastric abdominal pain, Abnormal CT of the GI                            tract Providers:                Carla RiggerMarc Madisen Ludvigsen, MD, Carla SarnaPatricia Ford, RN, Carla RobMarilyn                            Gordon, Technician Referring MD:              Medicines:                Propofol total dose 300 mg IV, Ondansetron 4 mg IV Complications:            No immediate complications. Estimated Blood Loss:     Estimated blood loss: none. Procedure:                Pre-Anesthesia Assessment:                           - Prior to the procedure, a History and Physical                            was performed, and patient medications and                            allergies were reviewed. The patient's tolerance of                            previous anesthesia was also reviewed. The risks                            and benefits of the procedure and the sedation                            options and risks were discussed with the patient.                            All questions were answered, and informed consent                            was obtained. Prior Anticoagulants: The patient has                            taken no previous anticoagulant or antiplatelet                            agents. ASA Grade Assessment: II - A patient with  mild systemic disease. After reviewing the risks                            and benefits, the patient was deemed in                            satisfactory condition to undergo the procedure.                           After obtaining informed consent, the endoscope was                            passed under direct vision. Throughout the                            procedure, the patient's  blood pressure, pulse, and                            oxygen saturations were monitored continuously. The                            EG-[id number] ([id number]) scope was introduced through the                            mouth, and advanced to the second part of duodenum.                            The upper GI endoscopy was accomplished without                            difficulty. The patient tolerated the procedure                            well. Scope In: Scope Out: Findings:      The larynx was normal.      A large hiatal hernia was present.      There were esophageal mucosal changes consistent with long-segment       Barrett's esophagus present in the entire esophagus. Biopsies were taken       with a cold forceps for histology.      A few small semi-sessile polyps were found in the cardia proximal       stomach and hiatal hernia pouch. Biopsies were taken with a cold forceps       for histology.      The duodenal bulb, first portion of the duodenum and second portion of       the duodenum were normal.      The exam was otherwise without abnormality. Impression:               - Normal larynx.                           - Large hiatal hernia. Wonder if some of her                            symptoms  were caused by partial twist                           - Esophageal mucosal changes consistent with                            long-segment Barrett's esophagus. Biopsied.                           - A few gastric polyps. Biopsied.                           - Normal duodenal bulb, first portion of the                            duodenum and second portion of the duodenum.                           - The examination was otherwise normal. Moderate Sedation:      N/A- Per Anesthesia Care Recommendation:           - Patient has a contact number available for                            emergencies. The signs and symptoms of potential                            delayed complications were discussed  with the                            patient. Return to normal activities tomorrow.                            Written discharge instructions were provided to the                            patient.                           -Clear liquid diet today.                           - Continue present medications.                           - Await pathology results.                           - Return to GI clinic PRN.                           - Telephone GI clinic if symptomatic PRN. Might                            need elective hiatal hernia surgery at some point  in the future                           - Telephone GI clinic for pathology results in 1                            week. Procedure Code(s):        --- Professional ---                           [phone], Esophagogastroduodenoscopy, flexible,                            transoral; with biopsy, single or multiple Diagnosis Code(s):        --- Professional ---                           K22.8, Other specified diseases of esophagus                           K44.9, Diaphragmatic hernia without obstruction or                            gangrene                           K31.7, Polyp of stomach and duodenum                           R10.13, Epigastric pain                           R93.3, Abnormal findings on diagnostic imaging of                            other parts of digestive tract CPT copyright 2017 [employer]. All rights reserved. The codes documented in this report are preliminary and upon coder review may  be revised to meet current compliance requirements. Carla Rigger, MD 11/29/2017 2:38:32 PM This report has been signed electronically. Number of Addenda: 0

## 2017-11-29 NOTE — Anesthesia Preprocedure Evaluation (Addendum)
Anesthesia Evaluation  Patient identified by MRN, date of birth, ID band Patient awake    Reviewed: Allergy & Precautions, NPO status , Patient's Chart, lab work & pertinent test results  History of Anesthesia Complications (+) PONV  Airway Mallampati: I  TM Distance: >3 FB Neck ROM: Full    Dental no notable dental hx. (+) Teeth Intact, Dental Advisory Given   Pulmonary asthma ,    Pulmonary exam normal breath sounds clear to auscultation       Cardiovascular Exercise Tolerance: Good hypertension, Normal cardiovascular exam Rhythm:Regular Rate:Normal     Neuro/Psych negative neurological ROS     GI/Hepatic Neg liver ROS, GERD  Medicated,  Endo/Other  negative endocrine ROS  Renal/GU negative Renal ROS     Musculoskeletal   Abdominal   Peds  Hematology  (+) anemia ,   Anesthesia Other Findings   Reproductive/Obstetrics                            Lab Results  Component Value Date   WBC 17.9 (H) 11/29/2017   HGB 11.3 (L) 11/29/2017   HCT 34.0 (L) 11/29/2017   MCV 88.3 11/29/2017   PLT 291 11/29/2017    Anesthesia Physical Anesthesia Plan  ASA: III  Anesthesia Plan: MAC   Post-op Pain Management:    Induction:   PONV Risk Score and Plan:   Airway Management Planned: Mask, Natural Airway and Nasal Cannula  Additional Equipment:   Intra-op Plan:   Post-operative Plan:   Informed Consent: I have reviewed the patients History and Physical, chart, labs and discussed the procedure including the risks, benefits and alternatives for the proposed anesthesia with the patient or authorized representative who has indicated his/her understanding and acceptance.     Plan Discussed with:   Anesthesia Plan Comments:         Anesthesia Quick Evaluation

## 2017-11-29 NOTE — Progress Notes (Signed)
[facility] Progress Note  Carla FeltyBrandi D Gordon 47 y.o. 10/25/1970  CC:  Abnormal CT scan, nausea vomiting   Subjective:  She's having worsening nausea and vomiting this morning. Complaining of significant burning in the esophagus. Not able to tolerate liquid diet today. Denies any diarrhea or constipation.Denies any blood in the vomiting.  ROS : egative for chest pain or shortness of breath.  Objective: Vital signs in last 24 hours: Vitals:   11/28/17 2002 11/29/17 0538  BP: (!) 132/92 (!) 140/94  Pulse: 88 68  Resp: 14 16  Temp: 98.5 F (36.9 C) 99.1 F (37.3 C)  SpO2: 98% 98%    Physical Exam:  General:  Alert, cooperative, no distress, appears stated age  Head:  Normocephalic, without obvious abnormality, atraumatic  Eyes:  , EOM's intact,   Lungs:   Fine basilar crackles  Heart:  Regular rate and rhythm, S1, S2 normal  Abdomen:   Soft, non-tender,nondistended bowel sounds present, no peritoneal signs  Extremities: Extremities normal, atraumatic, no  edema  Pulses: 2+ and symmetric    Lab Results: Recent Labs    11/28/17 0434 11/29/17 0446  NA 143 143  K 2.9* 3.2*  CL 105 110  CO2 28 25  GLUCOSE 96 98  BUN 12 6  CREATININE 0.77 0.68  CALCIUM 9.2 8.3*  MG  --  1.6*   Recent Labs    11/28/17 0434 11/29/17 0446  AST 24 20  ALT 18 17  ALKPHOS 55 46  BILITOT 0.9 0.7  PROT 7.5 6.5  ALBUMIN 4.2 3.7   Recent Labs    11/28/17 0434 11/29/17 0446  WBC 20.8* 17.9*  NEUTROABS  --  14.5*  HGB 12.6 11.3*  HCT 37.5 34.0*  MCV 86.2 88.3  PLT 340 291   No results for input(s): LABPROT, INR in the last 72 hours.    Assessment/Plan: - nausea and vomiting.. Etiology unclear but could be related to gastritis versus related to her moderate-sized hiatal hernia.CT scan showed mild distention of the esophagus. - Lower abdominal cramps. Improving. 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 Patient denied any epigastric pain. Normal  lipase.there is also concern for lymphoma and MRI of the abdomen is recommended by radiologist. - H/O  of Barrett's esophagus. - chronic constipation.  - leukocytosis. Etiology unclear. Improving - chronic GERD  Recommendations -------------------------- - patient having worsening nausea and vomiting today. EGD today for further evaluation. - recommend outpatient MRI MRCP in 4-6 weeks for further evaluation of abnormal CT scan concerning for lymphoma. - continue  omeprazole to 40 mg once a day. - normal IgG4.  - keep nothing by mouth for now. - Hold prophylactic Lovenox for today. - GI will follow  Risks (bleeding, infection, bowel perforation that could require surgery, sedation-related changes in cardiopulmonary systems), benefits (identification and possible treatment of source of symptoms, exclusion of certain causes of symptoms), and alternatives (watchful waiting, radiographic imaging studies, empiric medical treatment)  were explained to patient in detail and patient wishes to proceed.     Kathi DerParag Taija Mathias MD, FACP 11/29/2017, 9:16 AM  Contact #  [phone]

## 2017-11-29 NOTE — Progress Notes (Signed)
[name] 1:51 PM  Subjective: Patient seen and examined and case discussed with my partner Dr. B and hospital computer chart reviewed and she knows she is overdue for Barrett's screening but has periodically lost her insurance and has per ER ties other medical issues and she denies any of the usual causes of pancreatitis and no pancreatitis runs in the family and she has no other complaints  Objective: Vital signs stable afebrile exam please see preassessment evaluation no acute distress labs and CT reviewedprevious endoscopy reviewed  Assessment: Abnormal CT history of Barrett's questionable pancreatitis  Plan: Okay to proceed with endoscopy with anesthesia assistance and the procedure was rediscussed with the patient  [facility] Hospital[facility],Celine Dishman E  Pager [phone] After 5PM or if no answer call [phone]

## 2017-11-29 NOTE — Anesthesia Postprocedure Evaluation (Signed)
Anesthesia Post Note  Patient: [name]  Procedure(s) Performed: ESOPHAGOGASTRODUODENOSCOPY (EGD) WITH PROPOFOL (N/A ) BIOPSY     Patient location during evaluation: PACU Anesthesia Type: MAC Level of consciousness: awake and alert Pain management: pain level controlled Vital Signs Assessment: post-procedure vital signs reviewed and stable Respiratory status: spontaneous breathing, nonlabored ventilation, respiratory function stable and patient connected to nasal cannula oxygen Cardiovascular status: blood pressure returned to baseline and stable Postop Assessment: no apparent nausea or vomiting Anesthetic complications: no    Last Vitals:  Vitals:   11/29/17 1433 11/29/17 1450  BP: (!) 128/91 (!) 142/100  Pulse: 87 78  Resp: (!) 24 19  Temp: 36.4 C   SpO2: 99% 100%    Last Pain:  Vitals:   11/29/17 1450  TempSrc:   PainSc: 0-No pain                 Barnet Glasgow

## 2017-11-29 NOTE — Transfer of Care (Signed)
Immediate Anesthesia Transfer of Care Note  Patient: [name]  Procedure(s) Performed: Procedure(s): ESOPHAGOGASTRODUODENOSCOPY (EGD) WITH PROPOFOL (N/A) BIOPSY  Patient Location: PACU  Anesthesia Type:MAC  Level of Consciousness:  sedated, patient cooperative and responds to stimulation  Airway & Oxygen Therapy:Patient Spontanous Breathing and Patient connected to face mask oxgen  Post-op Assessment:  Report given to PACU RN and Post -op Vital signs reviewed and stable  Post vital signs:  Reviewed and stable  Last Vitals:  Vitals:   11/29/17 1320 11/29/17 1433  BP: (!) 152/104 (!) 128/91  Pulse: (!) 105 87  Resp: 15 (!) 24  Temp: 36.6 C   SpO2: 50% 53%    Complications: No apparent anesthesia complications

## 2017-11-29 NOTE — Progress Notes (Signed)
Pt has emesis this morning and can not take her oral medications. MD advised to hold medications until after her EGD at 1430.

## 2017-11-29 NOTE — Progress Notes (Addendum)
PROGRESS NOTE    FARZANA KOCI   JXB:[id number]  DOB: 06-03-1971  DOA: 11/27/2017 PCP: Tresa Garter, MD   Brief Narrative:  Archer Asa Rish is a 47 y.o. 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 Patient presents to the ED with c/o abd pain, N/V.     Subjective: Vomiting on empty stomach today.     Assessment & Plan:   Principal Problem: Vomiting - CT shows Hiatal hernia- h/o Barret's and chronic constipation - EGD done today shows a large hiatal hernia- management per GI  Active Problems: Abnormal CT - ? Lymphoma, ? Vasculitis- see report attached below - GI suggests MRCP in 4-6 wks    Essential hypertension - HCTZ on hold    Barrett's esophagus - on Zegrid  Gout - on Allopurinol - continue   DVT prophylaxis: SCDs Code Status: Full code Family Communication:  Disposition Plan: follow for symptoms Consultants:   GI Procedures:    Normal larynx.                           - Large hiatal hernia. Wonder if some of her                            symptoms were caused by partial twist                           - Esophageal mucosal changes consistent with                            long-segment Barrett's esophagus. Biopsied.                           - A few gastric polyps. Biopsied.                           - Normal duodenal bulb, first portion of the                            duodenum and second portion of the duodenum.                           - The examination was otherwise normal.   Antimicrobials:  Anti-infectives (From admission, onward)   Start     Dose/Rate Route Frequency Ordered Stop   11/28/17 0700  piperacillin-tazobactam (ZOSYN) IVPB 3.375 g  Status:  Discontinued     3.375 g 12.5 mL/hr over 240 Minutes Intravenous Every 8 hours 11/28/17 0105 11/28/17 0904   11/28/17 0015  piperacillin-tazobactam (ZOSYN) IVPB 3.375 g     3.375 g 100 mL/hr over 30 Minutes Intravenous  Once 11/28/17 0005 11/28/17 0135       Objective: Vitals:   11/29/17 0538 11/29/17 1320 11/29/17 1433 11/29/17 1450  BP: (!) 140/94 (!) 152/104 (!) 128/91 (!) 142/100  Pulse: 68 (!) 105 87 78  Resp: 16 15 (!) 24 19  Temp: 99.1 F (37.3 C) 97.8 F (36.6 C) 97.6 F (36.4 C)   TempSrc: Oral Oral Oral  SpO2: 98% 95% 99% 100%  Weight:  56.9 kg (125 lb 7.1 oz)    Height:  5' 7.5" (1.715 m)      Intake/Output Summary (Last 24 hours) at 11/29/2017 1633 Last data filed at 11/29/2017 1600 Gross per 24 hour  Intake 540 ml  Output 2900 ml  Net -2360 ml   Filed Weights   11/27/17 1635 11/28/17 0447 11/29/17 1320  Weight: 57.2 kg (126 lb) 56.9 kg (125 lb 7.1 oz) 56.9 kg (125 lb 7.1 oz)    Examination: General exam: Appears comfortable  HEENT: PERRLA, oral mucosa moist, no sclera icterus or thrush Respiratory system: Clear to auscultation. Respiratory effort normal. Cardiovascular system: S1 & S2 heard, RRR.   Gastrointestinal system: Abdomen soft, mildly tender in epigastrium, nondistended. Normal bowel sound. No organomegaly Central nervous system: Alert and oriented. No focal neurological deficits. Extremities: No cyanosis, clubbing or edema Skin: No rashes or ulcers Psychiatry:  Mood & affect appropriate.     Data Reviewed: I have personally reviewed following labs and imaging studies  CBC: Recent Labs  Lab 11/27/17 1655 11/28/17 0434 11/29/17 0446  WBC 20.7* 20.8* 17.9*  NEUTROABS  --   --  14.5*  HGB 15.0 12.6 11.3*  HCT 42.7 37.5 34.0*  MCV 85.4 86.2 88.3  PLT 349 340 291   Basic Metabolic Panel: Recent Labs  Lab 11/27/17 1655 11/28/17 0434 11/29/17 0446  NA 140 143 143  K 3.4* 2.9* 3.2*  CL 104 105 110  CO2 19* 28 25  GLUCOSE 131* 96 98  BUN 11 12 6   CREATININE 0.83 0.77 0.68  CALCIUM 10.0 9.2 8.3*  MG  --   --  1.6*   GFR: Estimated  Creatinine Clearance: 78.1 mL/min (by C-G formula based on SCr of 0.68 mg/dL). Liver Function Tests: Recent Labs  Lab 11/27/17 1655 11/28/17 0434 11/29/17 0446  AST 35 24 20  ALT 17 18 17   ALKPHOS 65 55 46  BILITOT 1.1 0.9 0.7  PROT 8.0 7.5 6.5  ALBUMIN 4.6 4.2 3.7   Recent Labs  Lab 11/27/17 1655  LIPASE 22   No results for input(s): AMMONIA in the last 168 hours. Coagulation Profile: No results for input(s): INR, PROTIME in the last 168 hours. Cardiac Enzymes: No results for input(s): CKTOTAL, CKMB, CKMBINDEX, TROPONINI in the last 168 hours. BNP (last 3 results) No results for input(s): PROBNP in the last 8760 hours. HbA1C: No results for input(s): HGBA1C in the last 72 hours. CBG: No results for input(s): GLUCAP in the last 168 hours. Lipid Profile: Recent Labs    11/28/17 0434  CHOL 222*  HDL 37*  LDLCALC 152*  TRIG 164*  CHOLHDL 6.0   Thyroid Function Tests: No results for input(s): TSH, T4TOTAL, FREET4, T3FREE, THYROIDAB in the last 72 hours. Anemia Panel: No results for input(s): VITAMINB12, FOLATE, FERRITIN, TIBC, IRON, RETICCTPCT in the last 72 hours. Urine analysis:    Component Value Date/Time   COLORURINE YELLOW 11/27/2017 2137   APPEARANCEUR CLEAR 11/27/2017 2137   LABSPEC 1.013 11/27/2017 2137   PHURINE 8.0 11/27/2017 2137   GLUCOSEU NEGATIVE 11/27/2017 2137   GLUCOSEU NEGATIVE 10/14/2016 1033   HGBUR MODERATE (A) 11/27/2017 2137   BILIRUBINUR NEGATIVE 11/27/2017 2137   KETONESUR 20 (A) 11/27/2017 2137   PROTEINUR 100 (A) 11/27/2017 2137   UROBILINOGEN 0.2 10/14/2016 1033   NITRITE NEGATIVE 11/27/2017 2137   LEUKOCYTESUR NEGATIVE 11/27/2017 2137   Sepsis Labs: @LABRCNTIP (procalcitonin:4,lacticidven:4) ) Recent Results (from the past 240 hour(s))  Wet prep, genital     Status: Abnormal   Collection Time: 11/27/17  9:38 PM  Result Value Ref Range Status   Yeast Wet Prep HPF POC NONE SEEN NONE SEEN Final   Trich, Wet Prep NONE SEEN NONE  SEEN Final   Clue Cells Wet Prep HPF POC PRESENT (A) NONE SEEN Final   WBC, Wet Prep HPF POC FEW (A) NONE SEEN Final   Sperm NONE SEEN  Final    Comment: Performed at [facility], [address] [place], [place] [address]  Blood Culture (routine x 2)     Status: None (Preliminary result)   Collection Time: 11/28/17  1:05 AM  Result Value Ref Range Status   Specimen Description   Final    BLOOD RIGHT WRIST Performed at [facility], [address], [place], [place] [address]    Special Requests   Final    BOTTLES DRAWN AEROBIC AND ANAEROBIC Blood Culture adequate volume Performed at [facility], [address], [place], [place] [address]    Culture   Final    NO GROWTH 1 DAY Performed at [facility], [address], [place], [place] [address]    Report Status PENDING  Incomplete  Blood Culture (routine x 2)     Status: None (Preliminary result)   Collection Time: 11/28/17  1:05 AM  Result Value Ref Range Status   Specimen Description   Final    BLOOD LEFT WRIST Performed at [facility], [address], [place], [place] [address]    Special Requests   Final    BOTTLES DRAWN AEROBIC AND ANAEROBIC Blood Culture adequate volume Performed at [facility], [address] [place], [place] [address]    Culture   Final    NO GROWTH 1 DAY Performed at [facility], [address] [place], [place] [address]    Report Status PENDING  Incomplete         Radiology Studies: US Pelvis Complete  Result Date: 11/28/2017 CLINICAL DATA:  Initial evaluation for acute mid lower pelvic pain. EXAM: TRANSABDOMINAL ULTRASOUND OF PELVIS DOPPLER ULTRASOUND OF OVARIES TECHNIQUE: Transabdominal ultrasound examination of the pelvis was performed including evaluation of the uterus, ovaries, adnexal regions, and pelvic cul-de-sac. Color and duplex Doppler ultrasound was utilized to evaluate  blood flow to the ovaries. COMPARISON:  Prior CT from earlier the same day. FINDINGS: Uterus Measurements: 7.4 x 3.4 x 4.0 cm. No fibroids or other mass visualized. Endometrium Thickness: 6.7 mm.  No focal abnormality visualized. Right ovary Measurements: 3.7 x 2.4 x 2.7 cm. Normal appearance/no adnexal mass. Left ovary Measurements: 4.4 x 2.2 x 3.6 cm. Normal appearance/no adnexal mass. Pulsed Doppler evaluation of both ovaries demonstrates normal low-resistance arterial and venous waveforms. Other findings No abnormal free fluid. IMPRESSION: Normal pelvic ultrasound. No evidence for torsion or other acute abnormality. Electronically Signed   By: Rise Mu M.D.   On: 11/28/2017 00:25   Ct Abdomen Pelvis W Contrast  Result Date: 11/27/2017 CLINICAL DATA:  Abdominal pain. Vomiting. Small amount of diarrhea. EXAM: CT ABDOMEN AND PELVIS WITH CONTRAST TECHNIQUE: Multidetector CT imaging of the abdomen and pelvis was performed using the standard protocol following bolus administration of intravenous contrast. CONTRAST:  ISOVUE-300 IOPAMIDOL (ISOVUE-300) INJECTION 61% COMPARISON:  September 12, 2012 FINDINGS: Lower chest: Emphysematous changes in the bases. Scattered atelectasis. Moderate hiatal hernia. The distal esophagus is fluid-filled and mildly distended. Lung bases  otherwise unremarkable. Hepatobiliary: Hepatic steatosis. The gallbladder is normal. No liver mass. The portal vein remains patent. Pancreas: There is increased attenuation of fat surrounding the pancreas. The process appears to be centered around the pancreas both anteriorly and posteriorly but there is a thin rim of fat attenuation between this material in the pancreas itself along much of its course. The pancreatic parenchyma is for the most part not definitively inflamed. Normal enhancement of the pancreas identified. Spleen: Normal in size without focal abnormality. Adrenals/Urinary Tract: Adrenal glands are unremarkable. Kidneys  are normal, without renal calculi, focal lesion, or hydronephrosis. Bladder is unremarkable. Stomach/Bowel: There is a moderate hiatal hernia. The remainder of the stomach is normal. The duodenum is normal in appearance. The remainder of the small bowel is unremarkable. Scattered colonic diverticuli are seen without diverticulitis. No colonic inflammation noted. The appendix is normal. Vascular/Lymphatic: The aorta demonstrates no atherosclerosis or aneurysm. No adenopathy. The high attenuation material adjacent to the pancreas also follows the proximal course of celiac artery branches and the portal vein. There is mild resulting narrowing of the proximal celiac artery. Reproductive: Uterus and bilateral adnexa are unremarkable. Other: There is free fluid in the pelvis. There is a rind of increased attenuation in the fat adjacent to the pancreas as above. There is some fat stranding in the mesentery and some fluid tracking to the left into the left pericolic gutter. No free air. Musculoskeletal: No acute or significant osseous findings. IMPRESSION: 1. There is increased attenuation in the fat surrounding the pancreas. There does appear to be a thin preserved fat plane between this material and the pancreas itself and the pancreas is not definitively inflamed in appearance. Based on imaging alone, I would suspect pancreatitis. However, the patient's lipase is normal. Consider an autoimmune pancreatitis which can be seen without an elevated lipase. The high attenuation material around the pancreas also tracks along branches of the celiac artery with some mild narrowing of the celiac artery proximally. This raises the possibility of a vasculitis although I think this is less likely as the bulk of the abnormality appears to be centered around the pancreas. Other unusual etiology such as lymphoma should be considered. The patient may benefit from a abdominal MRI. 2. Emphysematous changes in the lung bases. 3. Moderate  hiatal hernia. The distal esophagus above the hernia is fluid-filled and mildly distended. Recommend clinical correlation. An upper GI could better evaluate if there is concern for obstruction. 4. Diverticulosis without diverticulitis in the colon. Electronically Signed   By: Gerome Sam III M.D   On: 11/27/2017 23:55   [place] Art/ven Flow Abd Pelv Doppler  Result Date: 11/28/2017 CLINICAL DATA:  Initial evaluation for acute mid lower pelvic pain. EXAM: TRANSABDOMINAL ULTRASOUND OF PELVIS DOPPLER ULTRASOUND OF OVARIES TECHNIQUE: Transabdominal ultrasound examination of the pelvis was performed including evaluation of the uterus, ovaries, adnexal regions, and pelvic cul-de-sac. Color and duplex Doppler ultrasound was utilized to evaluate blood flow to the ovaries. COMPARISON:  Prior CT from earlier the same day. FINDINGS: Uterus Measurements: 7.4 x 3.4 x 4.0 cm. No fibroids or other mass visualized. Endometrium Thickness: 6.7 mm.  No focal abnormality visualized. Right ovary Measurements: 3.7 x 2.4 x 2.7 cm. Normal appearance/no adnexal mass. Left ovary Measurements: 4.4 x 2.2 x 3.6 cm. Normal appearance/no adnexal mass. Pulsed Doppler evaluation of both ovaries demonstrates normal low-resistance arterial and venous waveforms. Other findings No abnormal free fluid. IMPRESSION: Normal pelvic ultrasound. No evidence for torsion or other acute abnormality. Electronically Signed  By: Rise MuBenjamin  McClintock M.D.   On: 11/28/2017 00:25      Scheduled Meds: . cholecalciferol  1,000 Units Oral Daily  . enoxaparin (LOVENOX) injection  40 mg Subcutaneous Q24H  . ferrous sulfate  325 mg Oral Daily  . omeprazole  40 mg Oral QAC breakfast  . potassium chloride  40 mEq Oral Q4H   Continuous Infusions: . sodium chloride 100 mL/hr at 11/29/17 0126  . lactated ringers Stopped (11/29/17 1455)     LOS: 1 day    Time spent in minutes: 35    Calvert CantorSaima Zoria Rawlinson, MD [facility] Pager: [email or web address] Password  [email or web address] 11/29/2017, 4:33 PM

## 2017-11-30 DIAGNOSIS — K22719 Barrett's esophagus with dysplasia, unspecified: Secondary | ICD-10-CM

## 2017-11-30 DIAGNOSIS — R1110 Vomiting, unspecified: Secondary | ICD-10-CM

## 2017-11-30 DIAGNOSIS — K449 Diaphragmatic hernia without obstruction or gangrene: Principal | ICD-10-CM

## 2017-11-30 DIAGNOSIS — E876 Hypokalemia: Secondary | ICD-10-CM

## 2017-11-30 LAB — CBC WITH DIFFERENTIAL/PLATELET
BASOS PCT: 0 %
Basophils Absolute: 0 10*3/uL (ref 0.0–0.1)
EOS ABS: 0.3 10*3/uL (ref 0.0–0.7)
Eosinophils Relative: 2 %
HEMATOCRIT: 32.1 % — AB (ref 36.0–46.0)
HEMOGLOBIN: 10.6 g/dL — AB (ref 12.0–15.0)
Lymphocytes Relative: 30 %
Lymphs Abs: 3.2 10*3/uL (ref 0.7–4.0)
MCH: 29.4 pg (ref 26.0–34.0)
MCHC: 33 g/dL (ref 30.0–36.0)
MCV: 89.2 fL (ref 78.0–100.0)
Monocytes Absolute: 0.7 10*3/uL (ref 0.1–1.0)
Monocytes Relative: 7 %
NEUTROS ABS: 6.5 10*3/uL (ref 1.7–7.7)
NEUTROS PCT: 61 %
Platelets: 278 10*3/uL (ref 150–400)
RBC: 3.6 MIL/uL — AB (ref 3.87–5.11)
RDW: 13 % (ref 11.5–15.5)
WBC: 10.7 10*3/uL — AB (ref 4.0–10.5)

## 2017-11-30 LAB — BASIC METABOLIC PANEL
ANION GAP: 6 (ref 5–15)
BUN: <5 mg/dL — ABNORMAL LOW (ref 6–20)
CHLORIDE: 112 mmol/L — AB (ref 101–111)
CO2: 26 mmol/L (ref 22–32)
Calcium: 8 mg/dL — ABNORMAL LOW (ref 8.9–10.3)
Creatinine, Ser: 0.75 mg/dL (ref 0.44–1.00)
GFR calc non Af Amer: >60 mL/min (ref >60)
Glucose, Bld: 86 mg/dL (ref 65–99)
Potassium: 3.6 mmol/L (ref 3.5–5.1)
SODIUM: 144 mmol/L (ref 135–145)

## 2017-11-30 MED ORDER — FAMOTIDINE 20 MG PO TABS: 20.0000 mg | ORAL_TABLET | Freq: Two times a day (BID) | ORAL | 0 refills | Status: DC | PRN

## 2017-11-30 MED ORDER — OMEPRAZOLE-SODIUM BICARBONATE 20-1100 MG PO CAPS
1.0000 | ORAL_CAPSULE | Freq: Every day | ORAL | Status: DC
  Administered 2017-11-30: 1 via ORAL

## 2017-11-30 MED ORDER — OMEPRAZOLE-SODIUM BICARBONATE 20-1100 MG PO CAPS: 2.0000 | ORAL_CAPSULE | Freq: Two times a day (BID) | ORAL | Status: DC

## 2017-11-30 MED ORDER — OMEPRAZOLE-SODIUM BICARBONATE 20-1100 MG PO CAPS
1.0000 | ORAL_CAPSULE | Freq: Once | ORAL | Status: AC
  Administered 2017-11-30: 1 via ORAL

## 2017-11-30 NOTE — Discharge Instructions (Signed)
Check your BP daily. If it is rising and the top number is > 130, you can resume the HCTZ and K.   You were cared for by a hospitalist during your hospital stay. If you have any questions about your discharge medications or the care you received while you were in the hospital after you are discharged, you can call the unit and asked to speak with the hospitalist on call if the hospitalist that took care of you is not available. Once you are discharged, your primary care physician will handle any further medical issues. Please note that NO REFILLS for any discharge medications will be authorized once you are discharged, as it is imperative that you return to your primary care physician (or establish a relationship with a primary care physician if you do not have one) for your aftercare needs so that they can reassess your need for medications and monitor your lab values.  Please take all your medications with you for your next visit with your Primary MD. Please ask your Primary MD to get all Hospital records sent to his/her office. Please request your Primary MD to go over all hospital test results at the follow up.    If you experience worsening of your admission symptoms, develop shortness of breath, chest pain, suicidal or homicidal thoughts or a life threatening emergency, you must seek medical attention immediately by calling 911 or calling your MD.   Bonita QuinYou must read the complete instructions/literature along with all the possible adverse reactions/side effects for all the medicines you take including new medications that have been prescribed to you. Take new medicines after you have completely understood and accpet all the possible adverse reactions/side effects.    Do not drive when taking pain medications or sedatives.     Do not take more than prescribed Pain, Sleep and Anxiety Medications   If you have smoked or chewed Tobacco in the last 2 yrs please stop. Stop any regular alcohol  and or  recreational drug use.   Wear Seat belts while driving.

## 2017-11-30 NOTE — Progress Notes (Addendum)
[facility] Progress Note  Carla FeltyBrandi D Gordon 47 y.o. 03/03/1971  CC:  Abnormal CT scan, nausea vomiting   Subjective:   feeling better. Tolerating full liquid diet. Few episodes of nausea but denied any vomiting.Denied any blood in the stool or black stool.  ROS : egative for chest pain or shortness of breath.  Objective: Vital signs in last 24 hours: Vitals:   11/29/17 1951 11/30/17 0516  BP: (!) 140/94 (!) 126/91  Pulse: 80 64  Resp: 17 16  Temp: 98 F (36.7 C) 97.7 F (36.5 C)  SpO2: 97% 97%    Physical Exam:  General:  Alert, cooperative, no distress, appears stated age  Head:  Normocephalic, without obvious abnormality, atraumatic  Eyes:  , EOM's intact,   Lungs:   Fine basilar crackles  Heart:  Regular rate and rhythm, S1, S2 normal  Abdomen:   Soft, non-tender,nondistended bowel sounds present, no peritoneal signs  Extremities: Extremities normal, atraumatic, no  edema  Pulses: 2+ and symmetric    Lab Results: Recent Labs    11/29/17 0446 11/30/17 0445  NA 143 144  K 3.2* 3.6  CL 110 112*  CO2 25 26  GLUCOSE 98 86  BUN 6 <5*  CREATININE 0.68 0.75  CALCIUM 8.3* 8.0*  MG 1.6*  --    Recent Labs    11/28/17 0434 11/29/17 0446  AST 24 20  ALT 18 17  ALKPHOS 55 46  BILITOT 0.9 0.7  PROT 7.5 6.5  ALBUMIN 4.2 3.7   Recent Labs    11/29/17 0446 11/30/17 0445  WBC 17.9* 10.7*  NEUTROABS 14.5* 6.5  HGB 11.3* 10.6*  HCT 34.0* 32.1*  MCV 88.3 89.2  PLT 291 278   No results for input(s): LABPROT, INR in the last 72 hours.    Assessment/Plan: - nausea and vomiting.EGD yesterday (06/12) showed a large hiatal hernia, long segment Barrett's esophagus and few gastric polyps. - Lower abdominal cramps. Improving. 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 Patient denied any epigastric pain. Normal lipase.there is also concern for lymphoma and MRI of the abdomen is recommended by radiologist. - H/O  of Barrett's esophagus. -  chronic constipation.  - leukocytosis. Etiology unclear. Improving - chronic GERD  Recommendations -------------------------- - EGD yesterday showed large hiatal hernia. Concern for partial twist. Her symptoms of nausea and vomiting as well as abnormal CT scan concerning for trace amount of fluid around the pancreas and questionable vasculitis can be explained by possible ischemic changes secondary to partial torsion of the hiatal hernia.  - symptoms are improving. Advance diet to soft. - okay to use  OTC Zegrid ( apparently patient is not able to tolerate regular omeprazole.) - Surgery consult for further evaluation. Discussed with surgery PA. - GI will sign off.Follow-up in GI clinic in 4 weeks. Call us back if needed.   [name] MD, FACP 11/30/2017, 11:35 AM  Contact #  [phone]

## 2017-11-30 NOTE — Discharge Summary (Signed)
Physician Discharge Summary  [name]:[id number] DOB: [birth year] DOA: 11/27/2017  PCP: Tresa Garter, MD  Admit date: 11/27/2017 Discharge date: 11/30/2017  Admitted From: home  Disposition:  home   Recommendations for Outpatient Follow-up:  1. F/u MRI per GI  Discharge Condition:  stable   CODE STATUS:  Full code   Consultations:  GI    Discharge Diagnoses:  Principal Problem:   Vomiting Active Problems:   Hiatal hernia   Essential hypertension   Barrett's esophagus   Hypokalemia     Brief Summary: [name] D Hortonis a 47 y.o.femalewith medical history significant ofasthma, congenital cystic disease of lung, PE/DVT with hypercoag state presented fro adbominal pain, nausea and vomiting. She also had a small amount of watery diarrhea. She had eaten a burger from a fast food restaurant the day before.  Hospital Course:  Vomiting   Barrett's esophagus - CT shows Hiatal hernia- h/o Barret's and chronic constipation - EGD done as the patient continued to have episodes of vomiting on an empty stomach- this shows a large hiatal hernia- GI has recommended a Gen surgery eval for repair- Dr Ardine Eng PA has made an appt for 6/21 for her - I have advised her to eat small, bland meals for now- she continues to take Zegrin (about 2-3 tabs a day- GI aware)  Active Problems: Abnormal CT - ? Lymphoma, ? Vasculitis- see report attached below - GI suggests MRCP in 4-6 wks    Essential hypertension - HCTZ on hold- BP reasonably controlled   Hypokalemia - on K at home but K was 2.9- this is likely from HCTZ which has now been d/c'd - K today is 3.6 after replacement  Gout - on Allopurinol - continue   Procedures:    - Large hiatal hernia. Wonder if some of her  symptoms were caused by partial twist - Esophageal mucosal changes consistent with   long-segment Barrett's esophagus. Biopsied. - A few gastric polyps. Biopsied. - Normal duodenal bulb, first portion of the  duodenum and second portion of the duodenum. - The examination was otherwise normal.     Discharge Exam: Vitals:   11/30/17 0516 11/30/17 1418  BP: (!) 126/91 (!) 136/94  Pulse: 64 85  Resp: 16 14  Temp: 97.7 F (36.5 C) 97.8 F (36.6 C)  SpO2: 97% 98%   Vitals:   11/29/17 1450 11/29/17 1951 11/30/17 0516 11/30/17 1418  BP: (!) 142/100 (!) 140/94 (!) 126/91 (!) 136/94  Pulse: 78 80 64 85  Resp: 19 17 16 14   Temp:  98 F (36.7 C) 97.7 F (36.5 C) 97.8 F (36.6 C)  TempSrc:  Oral Oral Oral  SpO2: 100% 97% 97% 98%  Weight:      Height:        General: Pt is alert, awake, not in acute distress Cardiovascular: RRR, S1/S2 +, no rubs, no gallops Respiratory: CTA bilaterally, no wheezing, no rhonchi Abdominal: Soft, NT, ND, bowel sounds + Extremities: no edema, no cyanosis   Discharge Instructions  Discharge Instructions    Diet - low sodium heart healthy   Complete by:  As directed    Small meals every 2-3 hrs- should be bland- low fat and not spicy- limit caffeine intake   Increase activity slowly   Complete by:  As directed      Allergies as of 11/30/2017      Reactions   Guaifenesin Shortness Of Breath   Iron Anaphylaxis   IV only  Amoxapine And Related    Codeine Other (See Comments)   tiredness   Doxepin Hives   Hydromorphone Hcl Nausea And Vomiting   Metoprolol Succinate    REACTION: edema   Morphine Other (See Comments)   Does not work   Pantoprazole Sodium Other (See Comments)   heartburn   Pseudoeph-doxylamine-dm-apap Hives   Pseudoephedrine Hives   Sulfonamide Derivatives Hives   Tetracycline Hives   Prednisone Rash      Medication List    STOP taking these medications   colchicine 0.6  MG tablet   fluconazole 100 MG tablet Commonly known as:  DIFLUCAN   hydrochlorothiazide 25 MG tablet Commonly known as:  HYDRODIURIL   metroNIDAZOLE 500 MG tablet Commonly known as:  FLAGYL   potassium chloride SA 20 MEQ tablet Commonly known as:  K-DUR,KLOR-CON     TAKE these medications   allopurinol 100 MG tablet Commonly known as:  ZYLOPRIM Take 1 tablet (100 mg total) by mouth daily. Must keep visit on 11/03/17 to get refills What changed:    when to take this  reasons to take this  additional instructions   diazepam 5 MG tablet Commonly known as:  VALIUM take 1 tablet by mouth every 12 hours if needed for anxiety   famotidine 20 MG tablet Commonly known as:  PEPCID Take 1 tablet (20 mg total) by mouth 2 (two) times daily as needed for heartburn.   ferrous sulfate 325 (65 FE) MG EC tablet Take 325 mg by mouth daily.   MULTIVITAMIN ADULTS PO Take 1 tablet by mouth daily.   Vitamin D3 1000 units Caps Take 1,000 Units by mouth daily.   ZEGERID OTC PO Take 1 capsule by mouth daily.      Follow-up Information    Brahmbhatt, Parag, MD. Schedule an appointment as soon as possible for a visit in 4 week(s).   Specialty:  Gastroenterology Why:  large hiatal hernia, long segment Barrett's esophagus.she will need repeat MRI MRCP  Contact information: [address] [place] [address] [phone]        [name], MD. Go on 12/08/2017.   Specialty:  General Surgery Why:  Your appointment is 12/08/17 at 9:20AM to discuss surgical treatment of your hiatal hernia. Please arrive 30 minutes prior to your appointment to check in and fill out paperwork. Bring photo ID and insurance information. Contact information: [address] [place] [address] [phone]        Plotnikov, Georgina Quint, MD. Schedule an appointment as soon as possible for a visit in 1 week(s).   Specialty:  Internal Medicine Contact information: [address]  [place] [address] [phone]          Allergies  Allergen Reactions  . Guaifenesin Shortness Of Breath  . Iron Anaphylaxis    IV only  . Amoxapine And Related   . Codeine Other (See Comments)    tiredness  . Doxepin Hives  . Hydromorphone Hcl Nausea And Vomiting  . Metoprolol Succinate     REACTION: edema  . Morphine Other (See Comments)    Does not work  . Pantoprazole Sodium Other (See Comments)    heartburn  . Pseudoeph-Doxylamine-Dm-Apap Hives  . Pseudoephedrine Hives  . Sulfonamide Derivatives Hives  . Tetracycline Hives  . Prednisone Rash     Procedures/Studies: EGD  US Pelvis Complete  Result Date: 11/28/2017 CLINICAL DATA:  Initial evaluation for acute mid lower pelvic pain. EXAM: TRANSABDOMINAL ULTRASOUND OF  PELVIS DOPPLER ULTRASOUND OF OVARIES TECHNIQUE: Transabdominal ultrasound examination of the pelvis was performed including evaluation of the uterus, ovaries, adnexal regions, and pelvic cul-de-sac. Color and duplex Doppler ultrasound was utilized to evaluate blood flow to the ovaries. COMPARISON:  Prior CT from earlier the same day. FINDINGS: Uterus Measurements: 7.4 x 3.4 x 4.0 cm. No fibroids or other mass visualized. Endometrium Thickness: 6.7 mm.  No focal abnormality visualized. Right ovary Measurements: 3.7 x 2.4 x 2.7 cm. Normal appearance/no adnexal mass. Left ovary Measurements: 4.4 x 2.2 x 3.6 cm. Normal appearance/no adnexal mass. Pulsed Doppler evaluation of both ovaries demonstrates normal low-resistance arterial and venous waveforms. Other findings No abnormal free fluid. IMPRESSION: Normal pelvic ultrasound. No evidence for torsion or other acute abnormality. Electronically Signed   By: Rise MuBenjamin  McClintock M.D.   On: 11/28/2017 00:25   Ct Abdomen Pelvis W Contrast  Result Date: 11/27/2017 CLINICAL DATA:  Abdominal pain. Vomiting. Small amount of diarrhea. EXAM: CT ABDOMEN AND PELVIS WITH CONTRAST TECHNIQUE: Multidetector CT imaging of  the abdomen and pelvis was performed using the standard protocol following bolus administration of intravenous contrast. CONTRAST:  100mL ISOVUE-300 IOPAMIDOL (ISOVUE-300) INJECTION 61% COMPARISON:  September 12, 2012 FINDINGS: Lower chest: Emphysematous changes in the bases. Scattered atelectasis. Moderate hiatal hernia. The distal esophagus is fluid-filled and mildly distended. Lung bases otherwise unremarkable. Hepatobiliary: Hepatic steatosis. The gallbladder is normal. No liver mass. The portal vein remains patent. Pancreas: There is increased attenuation of fat surrounding the pancreas. The process appears to be centered around the pancreas both anteriorly and posteriorly but there is a thin rim of fat attenuation between this material in the pancreas itself along much of its course. The pancreatic parenchyma is for the most part not definitively inflamed. Normal enhancement of the pancreas identified. Spleen: Normal in size without focal abnormality. Adrenals/Urinary Tract: Adrenal glands are unremarkable. Kidneys are normal, without renal calculi, focal lesion, or hydronephrosis. Bladder is unremarkable. Stomach/Bowel: There is a moderate hiatal hernia. The remainder of the stomach is normal. The duodenum is normal in appearance. The remainder of the small bowel is unremarkable. Scattered colonic diverticuli are seen without diverticulitis. No colonic inflammation noted. The appendix is normal. Vascular/Lymphatic: The aorta demonstrates no atherosclerosis or aneurysm. No adenopathy. The high attenuation material adjacent to the pancreas also follows the proximal course of celiac artery branches and the portal vein. There is mild resulting narrowing of the proximal celiac artery. Reproductive: Uterus and bilateral adnexa are unremarkable. Other: There is free fluid in the pelvis. There is a rind of increased attenuation in the fat adjacent to the pancreas as above. There is some fat stranding in the mesentery and  some fluid tracking to the left into the left pericolic gutter. No free air. Musculoskeletal: No acute or significant osseous findings. IMPRESSION: 1. There is increased attenuation in the fat surrounding the pancreas. There does appear to be a thin preserved fat plane between this material and the pancreas itself and the pancreas is not definitively inflamed in appearance. Based on imaging alone, I would suspect pancreatitis. However, the patient's lipase is normal. Consider an autoimmune pancreatitis which can be seen without an elevated lipase. The high attenuation material around the pancreas also tracks along branches of the celiac artery with some mild narrowing of the celiac artery proximally. This raises the possibility of a vasculitis although I think this is less likely as the bulk of the abnormality appears to be centered around the pancreas. Other unusual etiology such as lymphoma  should be considered. The patient may benefit from a abdominal MRI. 2. Emphysematous changes in the lung bases. 3. Moderate hiatal hernia. The distal esophagus above the hernia is fluid-filled and mildly distended. Recommend clinical correlation. An upper GI could better evaluate if there is concern for obstruction. 4. Diverticulosis without diverticulitis in the colon. Electronically Signed   By: Gerome Sam III M.D   On: 11/27/2017 23:55   [place] Art/ven Flow Abd Pelv Doppler  Result Date: 11/28/2017 CLINICAL DATA:  Initial evaluation for acute mid lower pelvic pain. EXAM: TRANSABDOMINAL ULTRASOUND OF PELVIS DOPPLER ULTRASOUND OF OVARIES TECHNIQUE: Transabdominal ultrasound examination of the pelvis was performed including evaluation of the uterus, ovaries, adnexal regions, and pelvic cul-de-sac. Color and duplex Doppler ultrasound was utilized to evaluate blood flow to the ovaries. COMPARISON:  Prior CT from earlier the same day. FINDINGS: Uterus Measurements: 7.4 x 3.4 x 4.0 cm. No fibroids or other mass visualized.  Endometrium Thickness: 6.7 mm.  No focal abnormality visualized. Right ovary Measurements: 3.7 x 2.4 x 2.7 cm. Normal appearance/no adnexal mass. Left ovary Measurements: 4.4 x 2.2 x 3.6 cm. Normal appearance/no adnexal mass. Pulsed Doppler evaluation of both ovaries demonstrates normal low-resistance arterial and venous waveforms. Other findings No abnormal free fluid. IMPRESSION: Normal pelvic ultrasound. No evidence for torsion or other acute abnormality. Electronically Signed   By: Rise Mu M.D.   On: 11/28/2017 00:25     The results of significant diagnostics from this hospitalization (including imaging, microbiology, ancillary and laboratory) are listed below for reference.     Microbiology: Recent Results (from the past 240 hour(s))  Wet prep, genital     Status: Abnormal   Collection Time: 11/27/17  9:38 PM  Result Value Ref Range Status   Yeast Wet Prep HPF POC NONE SEEN NONE SEEN Final   Trich, Wet Prep NONE SEEN NONE SEEN Final   Clue Cells Wet Prep HPF POC PRESENT (A) NONE SEEN Final   WBC, Wet Prep HPF POC FEW (A) NONE SEEN Final   Sperm NONE SEEN  Final    Comment: Performed at [facility], [address] [place], [place] [address]  Blood Culture (routine x 2)     Status: None (Preliminary result)   Collection Time: 11/28/17  1:05 AM  Result Value Ref Range Status   Specimen Description   Final    BLOOD RIGHT WRIST Performed at [facility], [address], [place], [place] [address]    Special Requests   Final    BOTTLES DRAWN AEROBIC AND ANAEROBIC Blood Culture adequate volume Performed at [facility], [address], [place], [place] [address]    Culture   Final    NO GROWTH 2 DAYS Performed at [facility], [address] [place], [place] [address]    Report Status PENDING  Incomplete  Blood Culture (routine x 2)     Status: None (Preliminary result)   Collection Time: 11/28/17  1:05 AM   Result Value Ref Range Status   Specimen Description   Final    BLOOD LEFT WRIST Performed at [facility], [address], [place], [place] [address]    Special Requests   Final    BOTTLES DRAWN AEROBIC AND ANAEROBIC Blood Culture adequate volume Performed at [facility], [address], [place], [place] [address]    Culture   Final    NO GROWTH 2 DAYS Performed at [facility], [address] [place], [place] [address]    Report Status PENDING  Incomplete     Labs: BNP (last 3 results) No results for input(s): BNP in the last 8760 hours. Basic Metabolic Panel: Recent Labs  Lab 11/27/17 1655 11/28/17 0434 11/29/17 0446 11/30/17 0445  NA 140 143 143 144  K 3.4* 2.9* 3.2* 3.6  CL 104 105 110 112*  CO2 19* 28 25 26   GLUCOSE 131* 96 98 86  BUN 11 12 6  <5*  CREATININE 0.83 0.77 0.68 0.75  CALCIUM 10.0 9.2 8.3* 8.0*  MG  --   --  1.6*  --    Liver Function Tests: Recent Labs  Lab 11/27/17 1655 11/28/17 0434 11/29/17 0446  AST 35 24 20  ALT 17 18 17   ALKPHOS 65 55 46  BILITOT 1.1 0.9 0.7  PROT 8.0 7.5 6.5  ALBUMIN 4.6 4.2 3.7   Recent Labs  Lab 11/27/17 1655  LIPASE 22   No results for input(s): AMMONIA in the last 168 hours. CBC: Recent Labs  Lab 11/27/17 1655 11/28/17 0434 11/29/17 0446 11/30/17 0445  WBC 20.7* 20.8* 17.9* 10.7*  NEUTROABS  --   --  14.5* 6.5  HGB 15.0 12.6 11.3* 10.6*  HCT 42.7 37.5 34.0* 32.1*  MCV 85.4 86.2 88.3 89.2  PLT 349 340 291 278   Cardiac Enzymes: No results for input(s): CKTOTAL, CKMB, CKMBINDEX, TROPONINI in the last 168 hours. BNP: Invalid input(s): POCBNP CBG: No results for input(s): GLUCAP in the last 168 hours. D-Dimer No results for input(s): DDIMER in the last 72 hours. Hgb A1c No results for input(s): HGBA1C in the last 72 hours. Lipid Profile Recent Labs    11/28/17 0434  CHOL 222*  HDL 37*  LDLCALC 152*  TRIG 164*  CHOLHDL 6.0   Thyroid function  studies No results for input(s): TSH, T4TOTAL, T3FREE, THYROIDAB in the last 72 hours.  Invalid input(s): FREET3 Anemia work up No results for input(s): VITAMINB12, FOLATE, FERRITIN, TIBC, IRON, RETICCTPCT in the last 72 hours. Urinalysis    Component Value Date/Time   COLORURINE YELLOW 11/27/2017 2137   APPEARANCEUR CLEAR 11/27/2017 2137   LABSPEC 1.013 11/27/2017 2137   PHURINE 8.0 11/27/2017 2137   GLUCOSEU NEGATIVE 11/27/2017 2137   GLUCOSEU NEGATIVE 10/14/2016 1033   HGBUR MODERATE (A) 11/27/2017 2137   BILIRUBINUR NEGATIVE 11/27/2017 2137   KETONESUR 20 (A) 11/27/2017 2137   PROTEINUR 100 (A) 11/27/2017 2137   UROBILINOGEN 0.2 10/14/2016 1033   NITRITE NEGATIVE 11/27/2017 2137   LEUKOCYTESUR NEGATIVE 11/27/2017 2137   Sepsis Labs Invalid input(s): PROCALCITONIN,  WBC,  LACTICIDVEN Microbiology Recent Results (from the past 240 hour(s))  Wet prep, genital     Status: Abnormal   Collection Time: 11/27/17  9:38 PM  Result Value Ref Range Status   Yeast Wet Prep HPF POC NONE SEEN NONE SEEN Final   Trich, Wet Prep NONE SEEN NONE SEEN Final   Clue Cells Wet Prep HPF POC PRESENT (A) NONE SEEN Final   WBC, Wet Prep HPF POC FEW (A) NONE SEEN Final   Sperm NONE SEEN  Final    Comment: Performed at [facility], [address], [place], [place] [address]  Blood Culture (routine x 2)     Status: None (Preliminary result)   Collection Time: 11/28/17  1:05 AM  Result Value Ref Range Status   Specimen Description   Final    BLOOD RIGHT WRIST Performed at [facility],  [address], [place], [place] [address]    Special Requests   Final    BOTTLES DRAWN AEROBIC AND ANAEROBIC Blood Culture adequate volume Performed at [facility], [address], [place], [place] [address]    Culture   Final    NO GROWTH 2 DAYS Performed at [facility], [address], [place], [place] [address]    Report Status PENDING   Incomplete  Blood Culture (routine x 2)     Status: None (Preliminary result)   Collection Time: 11/28/17  1:05 AM  Result Value Ref Range Status   Specimen Description   Final    BLOOD LEFT WRIST Performed at [facility], [address] [place], [place] [address]    Special Requests   Final    BOTTLES DRAWN AEROBIC AND ANAEROBIC Blood Culture adequate volume Performed at [facility], [address] [place], [place] [address]    Culture   Final    NO GROWTH 2 DAYS Performed at [facility], [address] [place], [place] [address]    Report Status PENDING  Incomplete     Time coordinating discharge in minutes: 65  SIGNED:   Calvert Cantor, MD  [facility] 11/30/2017, 2:38 PM Pager   If 7PM-7AM, please contact night-coverage [email or web address] Password [email or web address]

## 2017-11-30 NOTE — Progress Notes (Signed)
Assessment unchanged. Pt verbalized understanding of dc instructions through teach back regarding follow up care, when to call the doctor or come to ED, as well as meds to resume. No scripts at dc. Home med, Zegerid, returned to pt from pharmacy. Discharged via wc to front entrance to meet sister awaiting to take pt home. Accompanied by nurse.

## 2017-12-01 ENCOUNTER — Encounter (HOSPITAL_COMMUNITY): Payer: Self-pay | Admitting: Gastroenterology

## 2017-12-03 LAB — CULTURE, BLOOD (ROUTINE X 2)
Culture: NO GROWTH
Culture: NO GROWTH
Special Requests: ADEQUATE
Special Requests: ADEQUATE

## 2017-12-08 ENCOUNTER — Ambulatory Visit: Payer: Self-pay | Admitting: General Surgery

## 2017-12-08 NOTE — H&P (Signed)
History of Present Illness [name] MD; 12/08/2017 9:46 AM) The patient is a 47 year old female who presents with a hiatal hernia. Chief Complaint: Hiatal hernia  Patient is a 47 year old female who is recently admitted to the hospital secondary to nausea, vomiting. Patient underwent CT scan which revealed a hiatal hernia. He had a hernia measured approximately 3.7 cm. I did review this personally. Patient nausea vomiting was attributed to possible twist of right hernia. I do not feel at this is the case. Patient does have a long history of a hiatal hernia. She states she's had previous endoscopy in 2010. This did reveal some Barrett's esophagus that time. Patient has been treating her reflux that she's experience with Zegerid. This is been treating her reflux well. Patient does state that she feels that she had a previous gastric emptying study which revealed slow transit time.  Patient had a previous exploratory surgery at 47 years old secondary to constipation. sHe's also had a tubal ligation. Patient's had previous lung surgery we she's had a right lower lobe and portion of her left lower lobe were removed. Patient's had no shortness of breath and is currently on no inhalers.    Past Surgical History [facility], LPN; 1/61/0960 4:54 AM) Knee Surgery  Right. Lung Surgery  Bilateral. Resection of Small Bowel   Diagnostic Studies History (Hadelyn Massenburg, LPN; 0/98/1191 4:78 AM) Colonoscopy  never Mammogram  never Pap Smear  1-5 years ago  Allergies (Hadelyn Massenburg, LPN; 2/95/6213 0:86 AM) Iron (Ferrous Gluconate) *HEMATOPOIETIC AGENTS*  GuaiFENesin *COUGH/COLD/ALLERGY*  Amoxapine *ANTIDEPRESSANTS*  Codeine Phosphate *ANALGESICS - OPIOID*  Doxepin HCl (Antipruritic) *DERMATOLOGICALS*  HYDROmorphone HCl *ANALGESICS - OPIOID*  Metoprolol & Diet Manage Prod *ANTIHYPERTENSIVES*  Morphine Sulfate (Concentrate) *ANALGESICS - OPIOID*   Pantoprazole Sodium *CHEMICALS*  PredniSONE (Pak) *CORTICOSTEROIDS*   Medication History (Hadelyn Massenburg, LPN; 5/78/4696 2:95 AM) Allopurinol (100MG  Tablet, Oral) Active. Azithromycin (250MG  Tablet, Oral) Active. HydroCHLOROthiazide (25MG  Tablet, Oral) Active. Klor-Con M20 ( Tablet ER, Oral) Active. Fluconazole (100MG  Tablet, Oral) Active. Famotidine (20MG  Tablet, Oral) Active.  Social History [facility], LPN; 2/84/1324 4:01 AM) Alcohol use  Occasional alcohol use. Caffeine use  Tea. No drug use  Tobacco use  Never smoker.  Family History Alberteen Spindle, LPN; 0/27/2536 6:44 AM) Arthritis  Sister. Depression  Mother, Sister. Diabetes Mellitus  Mother, Sister. Heart Disease  Mother, Sister. Heart disease in female family member before age 39  Hypertension  Mother. Ischemic Bowel Disease  Mother. Thyroid problems  Sister.  Pregnancy / Birth History Alberteen Spindle, LPN; 0/34/7425 9:56 AM) Age at menarche  11 years. Gravida  0 Irregular periods  Para  0  Other Problems (Hadelyn Massenburg, LPN; 3/87/5643 3:29 AM) Depression  Gastric Ulcer  Gastroesophageal Reflux Disease  High blood pressure  Migraine Headache  Other disease, cancer, significant illness  Pulmonary Embolism / Blood Clot in Legs  Transfusion history     Review of Systems [name] MD; 12/08/2017 9:44 AM) General Present- Fatigue. Not Present- Appetite Loss, Chills, Fever, Night Sweats, Weight Gain and Weight Loss. Skin Not Present- Change in Wart/Mole, Dryness, Hives, Jaundice, New Lesions, Non-Healing Wounds, Rash and Ulcer. HEENT Present- Seasonal Allergies and Wears glasses/contact lenses. Not Present- Earache, Hearing Loss, Hoarseness, Nose Bleed, Oral Ulcers, Ringing in the Ears, Sinus Pain, Sore Throat, Visual Disturbances and Yellow Eyes. Respiratory Not Present- Bloody sputum, Chronic Cough, Difficulty Breathing, Snoring and  Wheezing. Breast Not Present- Breast Mass, Breast Pain, Nipple Discharge and Skin Changes. Cardiovascular Not Present- Chest Pain,  Difficulty Breathing Lying Down, Leg Cramps, Palpitations, Rapid Heart Rate, Shortness of Breath and Swelling of Extremities. Gastrointestinal Present- Excessive gas, Nausea and Vomiting. Not Present- Abdominal Pain, Bloating, Bloody Stool, Change in Bowel Habits, Chronic diarrhea, Constipation, Difficulty Swallowing, Gets full quickly at meals, Hemorrhoids, Indigestion and Rectal Pain. Female Genitourinary Not Present- Frequency, Nocturia, Painful Urination, Pelvic Pain and Urgency. Musculoskeletal Not Present- Back Pain, Joint Pain, Joint Stiffness, Muscle Pain, Muscle Weakness and Swelling of Extremities. Neurological Present- Headaches. Not Present- Decreased Memory, Fainting, Numbness, Seizures, Tingling, Tremor, Trouble walking and Weakness. Psychiatric Present- Depression. Not Present- Anxiety, Bipolar, Change in Sleep Pattern, Fearful and Frequent crying. Endocrine Not Present- Cold Intolerance, Excessive Hunger, Hair Changes, Heat Intolerance, Hot flashes and New Diabetes. Hematology Not Present- Blood Thinners, Easy Bruising, Excessive bleeding, Gland problems, HIV and Persistent Infections. All other systems negative  Vitals (Hadelyn Massenburg LPN; 1/61/09606/21/2019 4:549:24 AM) 12/08/2017 9:23 AM Weight: 127.13 lb Height: 67in Body Surface Area: 1.67 m Body Mass Index: 19.91 kg/m  Temp.: 98.37F  Pulse: 66 (Regular)  Resp.: 18 (Unlabored)  P.OX: 98% (Room air)       Physical Exam [name](Jamye Balicki MD; 12/08/2017 9:46 AM) The physical exam findings are as follows: Note:Constitutional: No acute distress, conversant, appears stated age  Eyes: Anicteric sclerae, moist conjunctiva, no lid lag  Neck: No thyromegaly, trachea midline, no cervical lymphadenopathy  Lungs: Clear to auscultation biilaterally, normal respiratory effot  Cardiovascular:  regular rate & rhythm, no murmurs, no peripheal edema, pedal pulses 2+  GI: Soft, no masses or hepatosplenomegaly, non-tender to palpation  MSK: Normal gait, no clubbing cyanosis, edema  Skin: No rashes, palpation reveals normal skin turgor  Psychiatric: Appropriate judgment and insight, oriented to person, place, and time    Assessment & Plan [name](Paulena Servais MD; 12/08/2017 9:48 AM) HIATAL HERNIA (K44.9) Impression: 47 year old female with a type III hiatal hernia. Patient may also be experiencing delayed gastric emptying. 1. We will order a gastric emptying study to evaluate her for delayed gastric emptying. This may require a partial wrap versus a full wrap. 2. We will set the patient up for a robotic had a hernia. Nissen fundoplication versus possible toupee fundoplication. 3. Discussed with patient the risks and benefits of the procedure to include but not limited to: Infection, bleeding, damage to structures, possible pneumothorax, possible recurrence. The patient voiced understanding and wishes to proceed.

## 2017-12-08 NOTE — H&P (View-Only) (Signed)
History of Present Illness (Allexa Acoff MD; 12/08/2017 9:46 AM) The patient is a 47 year old female who presents with a hiatal hernia. Chief Complaint: Hiatal hernia  Patient is a 47-year-old female who is recently admitted to the hospital secondary to nausea, vomiting. Patient underwent CT scan which revealed a hiatal hernia. He had a hernia measured approximately 3.7 cm. I did review this personally. Patient nausea vomiting was attributed to possible twist of right hernia. I do not feel at this is the case. Patient does have a long history of a hiatal hernia. She states she's had previous endoscopy in 2010. This did reveal some Barrett's esophagus that time. Patient has been treating her reflux that she's experience with Zegerid. This is been treating her reflux well. Patient does state that she feels that she had a previous gastric emptying study which revealed slow transit time.  Patient had a previous exploratory surgery at 47 years old secondary to constipation. sHe's also had a tubal ligation. Patient's had previous lung surgery we she's had a right lower lobe and portion of her left lower lobe were removed. Patient's had no shortness of breath and is currently on no inhalers.    Past Surgical History (Hadelyn Massenburg, LPN; 12/08/2017 9:23 AM) Knee Surgery  Right. Lung Surgery  Bilateral. Resection of Small Bowel   Diagnostic Studies History (Hadelyn Massenburg, LPN; 12/08/2017 9:23 AM) Colonoscopy  never Mammogram  never Pap Smear  1-5 years ago  Allergies (Hadelyn Massenburg, LPN; 12/08/2017 9:28 AM) Iron (Ferrous Gluconate) *HEMATOPOIETIC AGENTS*  GuaiFENesin *COUGH/COLD/ALLERGY*  Amoxapine *ANTIDEPRESSANTS*  Codeine Phosphate *ANALGESICS - OPIOID*  Doxepin HCl (Antipruritic) *DERMATOLOGICALS*  HYDROmorphone HCl *ANALGESICS - OPIOID*  Metoprolol & Diet Manage Prod *ANTIHYPERTENSIVES*  Morphine Sulfate (Concentrate) *ANALGESICS - OPIOID*   Pantoprazole Sodium *CHEMICALS*  PredniSONE (Pak) *CORTICOSTEROIDS*   Medication History (Hadelyn Massenburg, LPN; 12/08/2017 9:25 AM) Allopurinol (100MG Tablet, Oral) Active. Azithromycin (250MG Tablet, Oral) Active. HydroCHLOROthiazide (25MG Tablet, Oral) Active. Klor-Con M20 (20MEQ Tablet ER, Oral) Active. Fluconazole (100MG Tablet, Oral) Active. Famotidine (20MG Tablet, Oral) Active.  Social History (Hadelyn Massenburg, LPN; 12/08/2017 9:23 AM) Alcohol use  Occasional alcohol use. Caffeine use  Tea. No drug use  Tobacco use  Never smoker.  Family History (Hadelyn Massenburg, LPN; 12/08/2017 9:23 AM) Arthritis  Sister. Depression  Mother, Sister. Diabetes Mellitus  Mother, Sister. Heart Disease  Mother, Sister. Heart disease in female family member before age 65  Hypertension  Mother. Ischemic Bowel Disease  Mother. Thyroid problems  Sister.  Pregnancy / Birth History (Hadelyn Massenburg, LPN; 12/08/2017 9:23 AM) Age at menarche  11 years. Gravida  0 Irregular periods  Para  0  Other Problems (Hadelyn Massenburg, LPN; 12/08/2017 9:23 AM) Depression  Gastric Ulcer  Gastroesophageal Reflux Disease  High blood pressure  Migraine Headache  Other disease, cancer, significant illness  Pulmonary Embolism / Blood Clot in Legs  Transfusion history     Review of Systems (Keymoni Mccaster MD; 12/08/2017 9:44 AM) General Present- Fatigue. Not Present- Appetite Loss, Chills, Fever, Night Sweats, Weight Gain and Weight Loss. Skin Not Present- Change in Wart/Mole, Dryness, Hives, Jaundice, New Lesions, Non-Healing Wounds, Rash and Ulcer. HEENT Present- Seasonal Allergies and Wears glasses/contact lenses. Not Present- Earache, Hearing Loss, Hoarseness, Nose Bleed, Oral Ulcers, Ringing in the Ears, Sinus Pain, Sore Throat, Visual Disturbances and Yellow Eyes. Respiratory Not Present- Bloody sputum, Chronic Cough, Difficulty Breathing, Snoring and  Wheezing. Breast Not Present- Breast Mass, Breast Pain, Nipple Discharge and Skin Changes. Cardiovascular Not Present- Chest Pain,   Difficulty Breathing Lying Down, Leg Cramps, Palpitations, Rapid Heart Rate, Shortness of Breath and Swelling of Extremities. Gastrointestinal Present- Excessive gas, Nausea and Vomiting. Not Present- Abdominal Pain, Bloating, Bloody Stool, Change in Bowel Habits, Chronic diarrhea, Constipation, Difficulty Swallowing, Gets full quickly at meals, Hemorrhoids, Indigestion and Rectal Pain. Female Genitourinary Not Present- Frequency, Nocturia, Painful Urination, Pelvic Pain and Urgency. Musculoskeletal Not Present- Back Pain, Joint Pain, Joint Stiffness, Muscle Pain, Muscle Weakness and Swelling of Extremities. Neurological Present- Headaches. Not Present- Decreased Memory, Fainting, Numbness, Seizures, Tingling, Tremor, Trouble walking and Weakness. Psychiatric Present- Depression. Not Present- Anxiety, Bipolar, Change in Sleep Pattern, Fearful and Frequent crying. Endocrine Not Present- Cold Intolerance, Excessive Hunger, Hair Changes, Heat Intolerance, Hot flashes and New Diabetes. Hematology Not Present- Blood Thinners, Easy Bruising, Excessive bleeding, Gland problems, HIV and Persistent Infections. All other systems negative  Vitals (Hadelyn Massenburg LPN; 12/08/2017 9:24 AM) 12/08/2017 9:23 AM Weight: 127.13 lb Height: 67in Body Surface Area: 1.67 m Body Mass Index: 19.91 kg/m  Temp.: 98.2F  Pulse: 66 (Regular)  Resp.: 18 (Unlabored)  P.OX: 98% (Room air)       Physical Exam (Ameia Morency MD; 12/08/2017 9:46 AM) The physical exam findings are as follows: Note:Constitutional: No acute distress, conversant, appears stated age  Eyes: Anicteric sclerae, moist conjunctiva, no lid lag  Neck: No thyromegaly, trachea midline, no cervical lymphadenopathy  Lungs: Clear to auscultation biilaterally, normal respiratory effot  Cardiovascular:  regular rate & rhythm, no murmurs, no peripheal edema, pedal pulses 2+  GI: Soft, no masses or hepatosplenomegaly, non-tender to palpation  MSK: Normal gait, no clubbing cyanosis, edema  Skin: No rashes, palpation reveals normal skin turgor  Psychiatric: Appropriate judgment and insight, oriented to person, place, and time    Assessment & Plan (Gentri Guardado MD; 12/08/2017 9:48 AM) HIATAL HERNIA (K44.9) Impression: 47-year-old female with a type III hiatal hernia. Patient may also be experiencing delayed gastric emptying. 1. We will order a gastric emptying study to evaluate her for delayed gastric emptying. This may require a partial wrap versus a full wrap. 2. We will set the patient up for a robotic had a hernia. Nissen fundoplication versus possible toupee fundoplication. 3. Discussed with patient the risks and benefits of the procedure to include but not limited to: Infection, bleeding, damage to structures, possible pneumothorax, possible recurrence. The patient voiced understanding and wishes to proceed. 

## 2017-12-11 ENCOUNTER — Other Ambulatory Visit (HOSPITAL_COMMUNITY): Payer: Self-pay | Admitting: General Surgery

## 2017-12-11 DIAGNOSIS — K449 Diaphragmatic hernia without obstruction or gangrene: Secondary | ICD-10-CM

## 2017-12-19 ENCOUNTER — Encounter (HOSPITAL_COMMUNITY): Payer: Self-pay

## 2017-12-19 ENCOUNTER — Encounter (HOSPITAL_COMMUNITY)
Admission: RE | Admit: 2017-12-19 | Discharge: 2017-12-19 | Disposition: A | Discharge status: Home / Self Care | Payer: Managed Care, Other (non HMO) | Source: Ambulatory Visit | Attending: General Surgery | Admitting: General Surgery

## 2017-12-19 DIAGNOSIS — K449 Diaphragmatic hernia without obstruction or gangrene: Secondary | ICD-10-CM | POA: Insufficient documentation

## 2017-12-19 MED ORDER — TECHNETIUM TC 99M SULFUR COLLOID
2.0000 | Freq: Once | INTRAVENOUS | Status: AC | PRN
  Administered 2017-12-19: 2 via ORAL

## 2017-12-19 NOTE — Patient Instructions (Addendum)
[name]  12/19/2017   Your procedure is scheduled on:  12-26-2017  Report to [facility] Main  Entrance  Report to admitting at   5:30 AM    Call this number if you have problems the morning of surgery [phone]   Remember: Do not eat food or drink liquids :After Midnight.     Take these medicines the morning of surgery with A SIP OF WATER:    IF NEEDED DIAZEPAM, ALLOPURINOL                                You may not have any metal on your body including hair pins and              piercings  Do not wear jewelry, make-up, lotions, powders or perfumes, deodorant             Do not wear nail polish.  Do not shave  48 hours prior to surgery.              Do not bring valuables to the hospital. [facility] IS NOT             RESPONSIBLE   FOR VALUABLES.  Contacts, dentures or bridgework may not be worn into surgery.  Leave suitcase in the car. After surgery it may be brought to your room.    Special Instructions:  Deep Breathing/ Cough and Leg Excercises              Please read over the following fact sheets you were given: _____________________________________________________________________             [facility] - Preparing for Surgery Before surgery, you can play an important role.  Because skin is not sterile, your skin needs to be as free of germs as possible.  You can reduce the number of germs on your skin by washing with CHG (chlorahexidine gluconate) soap before surgery.  CHG is an antiseptic cleaner which kills germs and bonds with the skin to continue killing germs even after washing. Please DO NOT use if you have an allergy to CHG or antibacterial soaps.  If your skin becomes reddened/irritated stop using the CHG and inform your nurse when you arrive at Short Stay. Do not shave (including legs and underarms) for at least 48 hours prior to the first CHG shower.  You may shave your face/neck. Please follow these instructions  carefully:  1.  Shower with CHG Soap the night before surgery and the  morning of Surgery.  2.  If you choose to wash your hair, wash your hair first as usual with your  normal  shampoo.  3.  After you shampoo, rinse your hair and body thoroughly to remove the  shampoo.                                       4.  Use CHG as you would any other liquid soap.  You can apply chg directly  to the skin and wash                       Gently with a scrungie or clean washcloth.  5.  Apply the CHG Soap to your body ONLY  FROM THE NECK DOWN.   Do not use on face/ open                           Wound or open sores. Avoid contact with eyes, ears mouth and genitals (private parts).                       Wash face,  Genitals (private parts) with your normal soap.             6.  Wash thoroughly, paying special attention to the area where your surgery  will be performed.  7.  Thoroughly rinse your body with warm water from the neck down.  8.  DO NOT shower/wash with your normal soap after using and rinsing off  the CHG Soap.             9.  Pat yourself dry with a clean towel.            10.  Wear clean pajamas.            11.  Place clean sheets on your bed the night of your first shower and do not  sleep with pets. Day of Surgery : Do not apply any lotions/deodorants the morning of surgery.  Please wear clean clothes to the hospital/surgery center.  FAILURE TO FOLLOW THESE INSTRUCTIONS MAY RESULT IN THE CANCELLATION OF YOUR SURGERY PATIENT SIGNATURE_________________________________  NURSE SIGNATURE__________________________________  ________________________________________________________________________

## 2017-12-20 ENCOUNTER — Other Ambulatory Visit: Payer: Self-pay

## 2017-12-20 ENCOUNTER — Encounter (HOSPITAL_COMMUNITY)
Admission: RE | Admit: 2017-12-20 | Discharge: 2017-12-20 | Disposition: A | Discharge status: Home / Self Care | Payer: Managed Care, Other (non HMO) | Source: Ambulatory Visit | Attending: General Surgery | Admitting: General Surgery

## 2017-12-20 ENCOUNTER — Encounter (HOSPITAL_COMMUNITY): Payer: Self-pay

## 2017-12-20 DIAGNOSIS — Z01818 Encounter for other preprocedural examination: Secondary | ICD-10-CM | POA: Insufficient documentation

## 2017-12-20 DIAGNOSIS — K449 Diaphragmatic hernia without obstruction or gangrene: Secondary | ICD-10-CM | POA: Diagnosis not present

## 2017-12-20 DIAGNOSIS — Z01810 Encounter for preprocedural cardiovascular examination: Secondary | ICD-10-CM | POA: Insufficient documentation

## 2017-12-20 HISTORY — DX: Vitamin D deficiency, unspecified: E55.9

## 2017-12-20 HISTORY — DX: Idiopathic chronic gout, unspecified site, without tophus (tophi): M1A.00X0

## 2017-12-20 HISTORY — DX: Deficiency of other specified B group vitamins: E53.8

## 2017-12-20 HISTORY — DX: Barrett's esophagus without dysplasia: K22.70

## 2017-12-20 HISTORY — DX: Diaphragmatic hernia without obstruction or gangrene: K44.9

## 2017-12-20 HISTORY — DX: Personal history of other specified conditions: Z87.898

## 2017-12-20 HISTORY — DX: Iron deficiency anemia, unspecified: D50.9

## 2017-12-20 HISTORY — DX: Other constipation: K59.09

## 2017-12-20 HISTORY — DX: Migraine, unspecified, not intractable, without status migrainosus: G43.909

## 2017-12-20 HISTORY — DX: Essential (primary) hypertension: I10

## 2017-12-20 LAB — HCG, SERUM, QUALITATIVE: Preg, Serum: NEGATIVE

## 2017-12-20 MED ORDER — CHLORHEXIDINE GLUCONATE CLOTH 2 % EX PADS
6.0000 | MEDICATED_PAD | Freq: Once | CUTANEOUS | Status: DC
  Filled 2017-12-20: qty 6

## 2017-12-20 NOTE — Progress Notes (Signed)
CBCdiff and BMET results dated 11-30-2017 in epic. CXR result dated 06-29-2017 in epic.

## 2017-12-25 NOTE — Anesthesia Preprocedure Evaluation (Addendum)
Anesthesia Evaluation  Patient identified by MRN, date of birth, ID band Patient awake    Reviewed: Allergy & Precautions, NPO status , Patient's Chart, lab work & pertinent test results  History of Anesthesia Complications (+) PONV  Airway Mallampati: III  TM Distance: <3 FB Neck ROM: Full  Mouth opening: Limited Mouth Opening  Dental  (+) Dental Advisory Given   Pulmonary asthma ,    breath sounds clear to auscultation       Cardiovascular hypertension, Pt. on medications + DVT   Rhythm:Regular Rate:Normal     Neuro/Psych  Headaches, Anxiety Depression    GI/Hepatic Neg liver ROS, hiatal hernia, GERD  ,  Endo/Other  negative endocrine ROS  Renal/GU negative Renal ROS     Musculoskeletal   Abdominal   Peds  Hematology negative hematology ROS (+)   Anesthesia Other Findings   Reproductive/Obstetrics                            Lab Results  Component Value Date   WBC 10.7 (H) 11/30/2017   HGB 10.6 (L) 11/30/2017   HCT 32.1 (L) 11/30/2017   MCV 89.2 11/30/2017   PLT 278 11/30/2017   Lab Results  Component Value Date   CREATININE 0.75 11/30/2017   BUN <5 (L) 11/30/2017   NA 144 11/30/2017   K 3.6 11/30/2017   CL 112 (H) 11/30/2017   CO2 26 11/30/2017    Anesthesia Physical Anesthesia Plan  ASA: II  Anesthesia Plan: General   Post-op Pain Management:    Induction: Intravenous  PONV Risk Score and Plan: 4 or greater and Scopolamine patch - Pre-op, Midazolam, Propofol infusion, Dexamethasone, Ondansetron and Treatment may vary due to age or medical condition  Airway Management Planned: Oral ETT  Additional Equipment:   Intra-op Plan:   Post-operative Plan: Extubation in OR  Informed Consent: I have reviewed the patients History and Physical, chart, labs and discussed the procedure including the risks, benefits and alternatives for the proposed anesthesia with the  patient or authorized representative who has indicated his/her understanding and acceptance.   Dental advisory given  Plan Discussed with: CRNA  Anesthesia Plan Comments:        Anesthesia Quick Evaluation

## 2017-12-26 ENCOUNTER — Observation Stay (HOSPITAL_COMMUNITY)
Admission: RE | Admit: 2017-12-26 | Discharge: 2017-12-28 | Disposition: A | Discharge status: Home / Self Care | Payer: Managed Care, Other (non HMO) | Source: Ambulatory Visit | Attending: General Surgery | Admitting: General Surgery

## 2017-12-26 ENCOUNTER — Encounter (HOSPITAL_COMMUNITY)
Admission: RE | Disposition: A | Discharge status: Home / Self Care | Payer: Self-pay | Source: Ambulatory Visit | Attending: General Surgery

## 2017-12-26 ENCOUNTER — Ambulatory Visit (HOSPITAL_COMMUNITY): Payer: Managed Care, Other (non HMO) | Admitting: Anesthesiology

## 2017-12-26 ENCOUNTER — Other Ambulatory Visit: Payer: Self-pay

## 2017-12-26 ENCOUNTER — Encounter (HOSPITAL_COMMUNITY): Payer: Self-pay | Admitting: *Deleted

## 2017-12-26 DIAGNOSIS — F419 Anxiety disorder, unspecified: Secondary | ICD-10-CM | POA: Insufficient documentation

## 2017-12-26 DIAGNOSIS — Z8249 Family history of ischemic heart disease and other diseases of the circulatory system: Secondary | ICD-10-CM | POA: Diagnosis not present

## 2017-12-26 DIAGNOSIS — K219 Gastro-esophageal reflux disease without esophagitis: Secondary | ICD-10-CM | POA: Insufficient documentation

## 2017-12-26 DIAGNOSIS — Z885 Allergy status to narcotic agent status: Secondary | ICD-10-CM | POA: Diagnosis not present

## 2017-12-26 DIAGNOSIS — F329 Major depressive disorder, single episode, unspecified: Secondary | ICD-10-CM | POA: Insufficient documentation

## 2017-12-26 DIAGNOSIS — Z86711 Personal history of pulmonary embolism: Secondary | ICD-10-CM | POA: Diagnosis not present

## 2017-12-26 DIAGNOSIS — Z888 Allergy status to other drugs, medicaments and biological substances status: Secondary | ICD-10-CM | POA: Diagnosis not present

## 2017-12-26 DIAGNOSIS — Z882 Allergy status to sulfonamides status: Secondary | ICD-10-CM | POA: Diagnosis not present

## 2017-12-26 DIAGNOSIS — Z9889 Other specified postprocedural states: Secondary | ICD-10-CM | POA: Diagnosis present

## 2017-12-26 DIAGNOSIS — I10 Essential (primary) hypertension: Secondary | ICD-10-CM | POA: Diagnosis not present

## 2017-12-26 DIAGNOSIS — Z79899 Other long term (current) drug therapy: Secondary | ICD-10-CM | POA: Insufficient documentation

## 2017-12-26 DIAGNOSIS — K449 Diaphragmatic hernia without obstruction or gangrene: Principal | ICD-10-CM | POA: Insufficient documentation

## 2017-12-26 DIAGNOSIS — Z86718 Personal history of other venous thrombosis and embolism: Secondary | ICD-10-CM | POA: Diagnosis not present

## 2017-12-26 DIAGNOSIS — J45909 Unspecified asthma, uncomplicated: Secondary | ICD-10-CM | POA: Insufficient documentation

## 2017-12-26 HISTORY — PX: INSERTION OF MESH: SHX5868

## 2017-12-26 LAB — TYPE AND SCREEN
ABO/RH(D): O POS
Antibody Screen: NEGATIVE

## 2017-12-26 SURGERY — FUNDOPLICATION, NISSEN, ROBOT-ASSISTED, LAPAROSCOPIC
Anesthesia: General | Site: Abdomen

## 2017-12-26 MED ORDER — LABETALOL HCL 5 MG/ML IV SOLN
INTRAVENOUS | Status: AC
  Filled 2017-12-26: qty 4

## 2017-12-26 MED ORDER — PROPOFOL 10 MG/ML IV BOLUS
INTRAVENOUS | Status: AC
  Filled 2017-12-26: qty 20

## 2017-12-26 MED ORDER — DEXAMETHASONE SODIUM PHOSPHATE 10 MG/ML IJ SOLN
INTRAMUSCULAR | Status: DC | PRN
  Administered 2017-12-26: 10 mg via INTRAVENOUS

## 2017-12-26 MED ORDER — EPHEDRINE 5 MG/ML INJ
INTRAVENOUS | Status: AC
  Filled 2017-12-26: qty 10

## 2017-12-26 MED ORDER — KETOROLAC TROMETHAMINE 30 MG/ML IJ SOLN
30.0000 mg | Freq: Four times a day (QID) | INTRAMUSCULAR | Status: DC | PRN
  Administered 2017-12-26 – 2017-12-27 (×4): 30 mg via INTRAVENOUS
  Filled 2017-12-26 (×4): qty 1

## 2017-12-26 MED ORDER — ONDANSETRON HCL 4 MG/2ML IJ SOLN
INTRAMUSCULAR | Status: DC | PRN
  Administered 2017-12-26: 4 mg via INTRAVENOUS

## 2017-12-26 MED ORDER — LACTATED RINGERS IV SOLN
INTRAVENOUS | Status: DC
  Administered 2017-12-26: 07:00:00 via INTRAVENOUS

## 2017-12-26 MED ORDER — ROCURONIUM BROMIDE 10 MG/ML (PF) SYRINGE
PREFILLED_SYRINGE | INTRAVENOUS | Status: DC | PRN
  Administered 2017-12-26: 20 mg via INTRAVENOUS
  Administered 2017-12-26: 50 mg via INTRAVENOUS

## 2017-12-26 MED ORDER — CEFAZOLIN SODIUM-DEXTROSE 2-4 GM/100ML-% IV SOLN
2.0000 g | INTRAVENOUS | Status: AC
  Administered 2017-12-26: 2 g via INTRAVENOUS
  Filled 2017-12-26: qty 100

## 2017-12-26 MED ORDER — MIDAZOLAM HCL 2 MG/2ML IJ SOLN
INTRAMUSCULAR | Status: AC
  Filled 2017-12-26: qty 2

## 2017-12-26 MED ORDER — LIDOCAINE 2% (20 MG/ML) 5 ML SYRINGE
INTRAMUSCULAR | Status: DC | PRN
  Administered 2017-12-26: 1 mg/kg/h via INTRAVENOUS

## 2017-12-26 MED ORDER — MIDAZOLAM HCL 5 MG/5ML IJ SOLN
INTRAMUSCULAR | Status: DC | PRN
  Administered 2017-12-26: 2 mg via INTRAVENOUS

## 2017-12-26 MED ORDER — SCOPOLAMINE 1 MG/3DAYS TD PT72
MEDICATED_PATCH | TRANSDERMAL | Status: AC
  Filled 2017-12-26: qty 1

## 2017-12-26 MED ORDER — ACETAMINOPHEN 500 MG PO TABS
1000.0000 mg | ORAL_TABLET | ORAL | Status: AC
  Administered 2017-12-26: 1000 mg via ORAL
  Filled 2017-12-26: qty 2

## 2017-12-26 MED ORDER — BUPIVACAINE-EPINEPHRINE (PF) 0.25% -1:200000 IJ SOLN
INTRAMUSCULAR | Status: AC
Start: 2017-12-26 — End: ?
  Filled 2017-12-26: qty 30

## 2017-12-26 MED ORDER — ONDANSETRON HCL 4 MG/2ML IJ SOLN
4.0000 mg | Freq: Four times a day (QID) | INTRAMUSCULAR | Status: DC | PRN
  Administered 2017-12-26 – 2017-12-27 (×2): 4 mg via INTRAVENOUS
  Filled 2017-12-26 (×2): qty 2

## 2017-12-26 MED ORDER — SCOPOLAMINE 1 MG/3DAYS TD PT72
MEDICATED_PATCH | TRANSDERMAL | Status: DC | PRN
  Administered 2017-12-26: 1 via TRANSDERMAL

## 2017-12-26 MED ORDER — FENTANYL CITRATE (PF) 250 MCG/5ML IJ SOLN
INTRAMUSCULAR | Status: AC
  Filled 2017-12-26: qty 5

## 2017-12-26 MED ORDER — CELECOXIB 200 MG PO CAPS
200.0000 mg | ORAL_CAPSULE | ORAL | Status: AC
  Administered 2017-12-26: 200 mg via ORAL
  Filled 2017-12-26: qty 1

## 2017-12-26 MED ORDER — HYDROMORPHONE HCL 1 MG/ML IJ SOLN: 1.0000 mg | INTRAMUSCULAR | Status: DC | PRN

## 2017-12-26 MED ORDER — PROMETHAZINE HCL 25 MG/ML IJ SOLN: 6.2500 mg | INTRAMUSCULAR | Status: DC | PRN

## 2017-12-26 MED ORDER — 0.9 % SODIUM CHLORIDE (POUR BTL) OPTIME
TOPICAL | Status: DC | PRN
  Administered 2017-12-26: 1000 mL

## 2017-12-26 MED ORDER — BUPIVACAINE-EPINEPHRINE 0.25% -1:200000 IJ SOLN
INTRAMUSCULAR | Status: DC | PRN
  Administered 2017-12-26: 20 mL

## 2017-12-26 MED ORDER — EPHEDRINE SULFATE-NACL 50-0.9 MG/10ML-% IV SOSY
PREFILLED_SYRINGE | INTRAVENOUS | Status: DC | PRN
  Administered 2017-12-26: 5 mg via INTRAVENOUS

## 2017-12-26 MED ORDER — KETAMINE HCL 10 MG/ML IJ SOLN
INTRAMUSCULAR | Status: AC
  Filled 2017-12-26: qty 1

## 2017-12-26 MED ORDER — SUCCINYLCHOLINE CHLORIDE 200 MG/10ML IV SOSY
PREFILLED_SYRINGE | INTRAVENOUS | Status: AC
  Filled 2017-12-26: qty 10

## 2017-12-26 MED ORDER — PROPOFOL 10 MG/ML IV BOLUS
INTRAVENOUS | Status: DC | PRN
  Administered 2017-12-26: 140 mg via INTRAVENOUS

## 2017-12-26 MED ORDER — LABETALOL HCL 5 MG/ML IV SOLN
INTRAVENOUS | Status: DC | PRN
  Administered 2017-12-26: 2.5 mg via INTRAVENOUS

## 2017-12-26 MED ORDER — SUCCINYLCHOLINE CHLORIDE 200 MG/10ML IV SOSY
PREFILLED_SYRINGE | INTRAVENOUS | Status: DC | PRN
  Administered 2017-12-26: 100 mg via INTRAVENOUS

## 2017-12-26 MED ORDER — FENTANYL CITRATE (PF) 100 MCG/2ML IJ SOLN
INTRAMUSCULAR | Status: AC
  Filled 2017-12-26: qty 2

## 2017-12-26 MED ORDER — SUGAMMADEX SODIUM 200 MG/2ML IV SOLN
INTRAVENOUS | Status: AC
  Filled 2017-12-26: qty 2

## 2017-12-26 MED ORDER — GABAPENTIN 300 MG PO CAPS
300.0000 mg | ORAL_CAPSULE | ORAL | Status: AC
  Administered 2017-12-26: 300 mg via ORAL
  Filled 2017-12-26: qty 1

## 2017-12-26 MED ORDER — DEXAMETHASONE SODIUM PHOSPHATE 10 MG/ML IJ SOLN
INTRAMUSCULAR | Status: AC
  Filled 2017-12-26: qty 1

## 2017-12-26 MED ORDER — ONDANSETRON HCL 4 MG/2ML IJ SOLN
INTRAMUSCULAR | Status: AC
  Filled 2017-12-26: qty 2

## 2017-12-26 MED ORDER — SUGAMMADEX SODIUM 200 MG/2ML IV SOLN
INTRAVENOUS | Status: DC | PRN
  Administered 2017-12-26: 120 mg via INTRAVENOUS

## 2017-12-26 MED ORDER — FENTANYL CITRATE (PF) 100 MCG/2ML IJ SOLN
25.0000 ug | INTRAMUSCULAR | Status: DC | PRN
  Administered 2017-12-26: 50 ug via INTRAVENOUS

## 2017-12-26 MED ORDER — ONDANSETRON 4 MG PO TBDP: 4.0000 mg | ORAL_TABLET | Freq: Four times a day (QID) | ORAL | Status: DC | PRN

## 2017-12-26 MED ORDER — PROPOFOL 500 MG/50ML IV EMUL
INTRAVENOUS | Status: DC | PRN
  Administered 2017-12-26: 25 ug/kg/min via INTRAVENOUS

## 2017-12-26 MED ORDER — LACTATED RINGERS IR SOLN
Status: DC | PRN
  Administered 2017-12-26: 1000 mL

## 2017-12-26 MED ORDER — HYDRALAZINE HCL 20 MG/ML IJ SOLN: 5.0000 mg | Freq: Once | INTRAMUSCULAR | Status: DC

## 2017-12-26 MED ORDER — ENOXAPARIN SODIUM 40 MG/0.4ML ~~LOC~~ SOLN
40.0000 mg | SUBCUTANEOUS | Status: DC
  Administered 2017-12-27 – 2017-12-28 (×2): 40 mg via SUBCUTANEOUS
  Filled 2017-12-26 (×2): qty 0.4

## 2017-12-26 MED ORDER — FENTANYL CITRATE (PF) 100 MCG/2ML IJ SOLN
INTRAMUSCULAR | Status: DC | PRN
  Administered 2017-12-26 (×3): 50 ug via INTRAVENOUS

## 2017-12-26 MED ORDER — LIDOCAINE 2% (20 MG/ML) 5 ML SYRINGE
INTRAMUSCULAR | Status: DC | PRN
  Administered 2017-12-26: 80 mg via INTRAVENOUS

## 2017-12-26 MED ORDER — DEXTROSE-NACL 5-0.9 % IV SOLN
INTRAVENOUS | Status: DC
  Administered 2017-12-26: 12:00:00 via INTRAVENOUS
  Administered 2017-12-26 – 2017-12-28 (×3): 1000 mL via INTRAVENOUS

## 2017-12-26 MED ORDER — KETAMINE HCL 10 MG/ML IJ SOLN
INTRAMUSCULAR | Status: DC | PRN
  Administered 2017-12-26 (×2): 15 mg via INTRAVENOUS

## 2017-12-26 MED ORDER — LIDOCAINE HCL 2 % IJ SOLN
INTRAMUSCULAR | Status: AC
  Filled 2017-12-26: qty 20

## 2017-12-26 SURGICAL SUPPLY — 54 items
APPLIER CLIP 5 13 M/L LIGAMAX5 (MISCELLANEOUS)
APPLIER CLIP ROT 10 11.4 M/L (STAPLE)
BLADE SURG SZ11 CARB STEEL (BLADE) ×3 IMPLANT
CHLORAPREP W/TINT 26ML (MISCELLANEOUS) ×3 IMPLANT
CLIP APPLIE 5 13 M/L LIGAMAX5 (MISCELLANEOUS) IMPLANT
CLIP APPLIE ROT 10 11.4 M/L (STAPLE) IMPLANT
CLIP VESOLOCK LG 6/CT PURPLE (CLIP) IMPLANT
CLIP VESOLOCK MED LG 6/CT (CLIP) IMPLANT
COVER SURGICAL LIGHT HANDLE (MISCELLANEOUS) ×3 IMPLANT
COVER TIP SHEARS 8 DVNC (MISCELLANEOUS) IMPLANT
COVER TIP SHEARS 8MM DA VINCI (MISCELLANEOUS)
DECANTER SPIKE VIAL GLASS SM (MISCELLANEOUS) ×3 IMPLANT
DERMABOND ADVANCED (GAUZE/BANDAGES/DRESSINGS) ×1
DERMABOND ADVANCED .7 DNX12 (GAUZE/BANDAGES/DRESSINGS) ×2 IMPLANT
DRAIN PENROSE 18X1/2 LTX STRL (DRAIN) ×3 IMPLANT
DRAPE ARM DVNC X/XI (DISPOSABLE) ×8 IMPLANT
DRAPE COLUMN DVNC XI (DISPOSABLE) ×2 IMPLANT
DRAPE DA VINCI XI ARM (DISPOSABLE) ×4
DRAPE DA VINCI XI COLUMN (DISPOSABLE) ×1
ELECT REM PT RETURN 15FT ADLT (MISCELLANEOUS) ×3 IMPLANT
ENDOLOOP SUT PDS II  0 18 (SUTURE)
ENDOLOOP SUT PDS II 0 18 (SUTURE) IMPLANT
GAUZE 4X4 16PLY RFD (DISPOSABLE) ×3 IMPLANT
GLOVE BIO SURGEON STRL SZ7.5 (GLOVE) ×6 IMPLANT
GOWN STRL REUS W/TWL XL LVL3 (GOWN DISPOSABLE) ×12 IMPLANT
KIT BASIN OR (CUSTOM PROCEDURE TRAY) ×3 IMPLANT
MARKER SKIN DUAL TIP RULER LAB (MISCELLANEOUS) ×3 IMPLANT
MESH BIO-A 7X10 SYN MAT (Mesh General) ×3 IMPLANT
NEEDLE HYPO 22GX1.5 SAFETY (NEEDLE) ×3 IMPLANT
NEEDLE INSUFFLATION 14GA 120MM (NEEDLE) ×3 IMPLANT
OBTURATOR OPTICAL STANDARD 8MM (TROCAR)
OBTURATOR OPTICAL STND 8 DVNC (TROCAR)
OBTURATOR OPTICALSTD 8 DVNC (TROCAR) IMPLANT
PACK CARDIOVASCULAR III (CUSTOM PROCEDURE TRAY) ×3 IMPLANT
PAD POSITIONING PINK XL (MISCELLANEOUS) ×3 IMPLANT
SCISSORS LAP 5X35 DISP (ENDOMECHANICALS) ×3 IMPLANT
SEAL CANN UNIV 5-8 DVNC XI (MISCELLANEOUS) ×8 IMPLANT
SEAL XI 5MM-8MM UNIVERSAL (MISCELLANEOUS) ×4
SEALER VESSEL DA VINCI XI (MISCELLANEOUS) ×1
SEALER VESSEL EXT DVNC XI (MISCELLANEOUS) ×2 IMPLANT
SET IRRIG TUBING LAPAROSCOPIC (IRRIGATION / IRRIGATOR) ×3 IMPLANT
SOLUTION ANTI FOG 6CC (MISCELLANEOUS) ×3 IMPLANT
SOLUTION ELECTROLUBE (MISCELLANEOUS) ×3 IMPLANT
STAPLER VISISTAT 35W (STAPLE) IMPLANT
SUT ETHIBOND 0 36 GRN (SUTURE) ×6 IMPLANT
SUT MNCRL AB 4-0 PS2 18 (SUTURE) ×3 IMPLANT
SUT SILK 0 SH 30 (SUTURE) ×9 IMPLANT
SUT SILK 2 0 SH (SUTURE) ×6 IMPLANT
SYR 20CC LL (SYRINGE) ×3 IMPLANT
TOWEL OR 17X26 10 PK STRL BLUE (TOWEL DISPOSABLE) ×3 IMPLANT
TOWEL OR NON WOVEN STRL DISP B (DISPOSABLE) ×3 IMPLANT
TRAY FOLEY MTR SLVR 16FR STAT (SET/KITS/TRAYS/PACK) IMPLANT
TROCAR ADV FIXATION 5X100MM (TROCAR) ×3 IMPLANT
TUBING INSUFFLATION (TUBING) ×3 IMPLANT

## 2017-12-26 NOTE — Op Note (Signed)
12/26/2017  9:57 AM  PATIENT:  [name]  47 y.o. female  PRE-OPERATIVE DIAGNOSIS:  hiatal hernia  POST-OPERATIVE DIAGNOSIS:  hiatal hernia  PROCEDURE:  Procedure(s): XI ROBOTIC HIATAL HERNIA REPAIR AND TOUPET ESOPHAGEAL WRAP  INSERTION OF BIO A MESH (N/A)  SURGEON:  Surgeon(s) and Role:    * [name], MD - Primary    * Berna Bue, MD - Assisting   ANESTHESIA:   local and general  EBL:  30 mL   BLOOD ADMINISTERED:none  DRAINS: none   LOCAL MEDICATIONS USED:  BUPIVICAINE   SPECIMEN:  No Specimen  DISPOSITION OF SPECIMEN:  N/A  COUNTS:  YES  TOURNIQUET:  * No tourniquets in log *  DICTATION: .Dragon Dictation The patient was taken back to the operating room and placed in the supine position with bilateral SCDs in place. The patient was prepped and draped in the usual sterile fashion. After appropriate antibiotics were confirmed a timeout was called and all facts were verified.   A Veress needle technique was used to insufflate the abdomen to 15 mm of mercury the paramedian stab incision. Subsequent to this an 8 mm trocar was introduced as was a 8 millimeter camera. At this time the subsequent robotic trochars x3, were then placed adjacent to this trocar approximately 8-10 cm away. Each trocar was inserted under direct visualization, there were total of 4 trochars. The assistant trocar was then placed in the right lower quadrant under direct visualization. The Nathanson retractor was then visualized inserted into the abdomen and the incision just to the left of the falciform ligament. This was then placed to retract the liver appropriately. At this time the patient was positioned in reverse Trendelenburg.   At this time the robot patient cart was brought to the bedside and placed in good position and the arms were docked to the trochars appropriately. At this time I proceeded to incised the gastrohepatic ligament. There was some adhesions to the the right and  left crus that were not classic. At this time I proceeded to mobilize the stomach inferiorly and visualize the right crus. The peritoneum over the right crus was incised and right crus was identified. I proceeded to dissect this inferiorly until the left crus was seen joining the right crus. Once the right crus was adequately dissected we turned our to the left crus which was dissected away. This required traction of the stomach to the right side. Once this was visualized we then proceeded to circumferentially dissect the esophagus away from the surrounding tissue. At this time a Penrose drain was placed around the esophagus to help with retraction. At this time the phrenoesophageal fat pad was dissected away from the esophagus. There was a moderate-sized hiatal hernia seen. I mobilized the esophagus cephalad approximately 5-6 cm, clearing away the surrounding tissue. The anterior hernia sac was dissected away from the stomach and esophagus.  At this time we turned our attention to the greater curvature the stomach and the omentum was mobilized using the robotic vessel sealer. This was taken up to the greater curvature to the hiatus.  The pancreas appeared chronically inflamed.  There was some chronic adhesions to the posterior wall of the stomach to the pancreas. This mobilized the entire greater curvature to allow mobilization and the wrap. I then proceeded to bring the greater curvature the stomach posterior to the esophagus, and a shoeshine technique was used to evaluate the mobilization of the greater curvature.   At this time I  proceeded to close the hiatus using figure of 8 0 Ethibonds x 3. This brought together the hiatal closure without undue stricture to the esophagus.   A piece of Gore Bio A hiatal mesh was placed over the hiatal closure and sutured to the crus using 2-0 silk sutures x 4.  At this time the greater curvature was brought around the esophagus and sutured using 0 silk sutures  interrupted fashion approximately 1 cm apart x3 on each end of the stomach.  The wrap was done in a Toupet fashion.  The wrap lay loose with no strangulation of the esophagus.  The perihepatic gutter was irrigated out.  There was some tearing of the hepatic capsule.  This required some cautery.  At this time the robot was undocked. The liver trocar was removed. At this time insufflation was evacuated. Skin was reapproximated for Monocryl subcuticular fashion. The skin was then dressed with Dermabond. The patient tolerated the procedure well and was taken to the recovery room in stable condition.     PLAN OF CARE: Admit for overnight observation  PATIENT DISPOSITION:  PACU - hemodynamically stable.   Delay start of Pharmacological VTE agent (>24hrs) due to surgical blood loss or risk of bleeding: yes

## 2017-12-26 NOTE — Discharge Instructions (Signed)
EATING AFTER YOUR ESOPHAGEAL SURGERY °(Stomach Fundoplication, Hiatal Hernia repair, Achalasia surgery, etc) ° °###################################################################### ° °EAT °Start with a pureed / full liquid diet (see below) °Gradually transition to a high fiber diet with a fiber supplement over the next month after discharge.   ° °WALK °Walk an hour a day.  Control your pain to do that.   ° °CONTROL PAIN °Control pain so that you can walk, sleep, tolerate sneezing/coughing, go up/down stairs. ° °HAVE A BOWEL MOVEMENT DAILY °Keep your bowels regular to avoid problems.  OK to try a laxative to override constipation.  OK to use an antidairrheal to slow down diarrhea.  Call if not better after 2 tries ° °CALL IF YOU HAVE PROBLEMS/CONCERNS °Call if you are still struggling despite following these instructions. °Call if you have concerns not answered by these instructions ° °###################################################################### ° ° °After your esophageal surgery, expect some sticking with swallowing over the next 1-2 months.   ° °If food sticks when you eat, it is called "dysphagia".  This is due to swelling around your esophagus at the wrap & hiatal diaphragm repair.  It will gradually ease off over the next few months.  To help you through this temporary phase, we start you out on a pureed (blenderized) diet. ° °Your first meal in the hospital was thin liquids.  You should have been given a pureed diet by the time you left the hospital.  We ask patients to stay on a pureed diet for the first 2-3 weeks to avoid anything getting "stuck" near your recent surgery.  Don't be alarmed if your ability to swallow doesn't progress according to this plan.  Everyone is different and some diets can advance more or less quickly.   ° ° °Some BASIC RULES to follow are: °· Maintain an upright position whenever eating or drinking. °· Take small bites - just a teaspoon size bite at a time. °· Eat slowly.   It may also help to eat only one food at a time. °· Consider nibbling through smaller, more frequent meals & avoid the urge to eat BIG meals °· Do not push through feelings of fullness, nausea, or bloatedness °· Do not mix solid foods and liquids in the same mouthful °· Try not to "wash foods down" with large gulps of liquids. °· Avoid carbonated (bubbly/fizzy) drinks.   °· Avoid foods that make you feel gassy or bloated.  Start with bland foods first.  Wait on trying greasy, fried, or spicy meals until you are tolerating more bland solids well. °· Understand that it will be hard to burp and belch at first.  This gradually improves with time.  Expect to be more gassy/flatulent/bloated initially.  Walking will help your body manage it better. °· Consider using medications for bloating that contain simethicone such as  Maalox or Gas-X  °· Eat in a relaxed atmosphere & minimize distractions. °· Avoid talking while eating.   °· Do not use straws. °· Following each meal, sit in an upright position (90 degree angle) for 60 to 90 minutes.  Going for a short walk can help as well °· If food does stick, don't panic.  Try to relax and let the food pass on its own.  Sipping WARM LIQUID such as strong hot black tea can also help slide it down. ° ° °Be gradual in changes & use common sense: ° °-If you easily tolerating a certain "level" of foods, advance to the next level gradually °-If you are   having trouble swallowing a particular food, then avoid it.   °-If food is sticking when you advance your diet, go back to thinner previous diet (the lower LEVEL) for 1-2 days. ° °LEVEL 1 = PUREED DIET ° °Do for the first 2 WEEKS AFTER SURGERY ° °-Foods in this group are pureed or blenderized to a smooth, mashed potato-like consistency.  °-If necessary, the pureed foods can keep their shape with the addition of a thickening agent.   °-Meat should be pureed to a smooth, pasty consistency.  Hot broth or gravy may be added to the pureed  meat, approximately 1 oz. of liquid per 3 oz. serving of meat. °-CAUTION:  If any foods do not puree into a smooth consistency, swallowing will be more difficult.  (For example, nuts or seeds sometimes do not blend well.) ° °Hot Foods Cold Foods  °Pureed scrambled eggs and cheese Pureed cottage cheese  °Baby cereals Thickened juices and nectars  °Thinned cooked cereals (no lumps) Thickened milk or eggnog  °Pureed French toast or pancakes Ensure  °Mashed potatoes Ice cream  °Pureed parsley, au gratin, scalloped potatoes, candied sweet potatoes Fruit or Italian ice, sherbet  °Pureed buttered or alfredo noodles Plain yogurt  °Pureed vegetables (no corn or peas) Instant breakfast  °Pureed soups and creamed soups Smooth pudding, mousse, custard  °Pureed scalloped apples Whipped gelatin  °Gravies Sugar, syrup, honey, jelly  °Sauces, cheese, tomato, barbecue, white, creamed Cream  °Any baby food Creamer  °Alcohol in moderation (not beer or champagne) Margarine  °Coffee or tea Mayonnaise  ° Ketchup, mustard  ° Apple sauce  ° °SAMPLE MENU:  PUREED DIET °Breakfast Lunch Dinner  °· Orange juice, 1/2 cup °· Cream of wheat, 1/2 cup · Pineapple juice, 1/2 cup · Pureed turkey, barley soup, 3/4 cup °· Pureed Hawaiian chicken, 3 oz  °· Scrambled eggs, mashed or blended with cheese, 1/2 cup °· Tea or coffee, 1 cup  °· Whole milk, 1 cup  °· Non-dairy creamer, 2 Tbsp. · Mashed potatoes, 1/2 cup °· Pureed cooled broccoli, 1/2 cup °· Apple sauce, 1/2 cup °· Coffee or tea · Mashed potatoes, 1/2 cup °· Pureed spinach, 1/2 cup °· Frozen yogurt, 1/2 cup °· Tea or coffee  ° ° ° ° °LEVEL 2 = SOFT DIET ° °After your first 2 weeks, you can advance to a soft diet.   °Keep on this diet until everything goes down easily. ° °Hot Foods Cold Foods  °White fish Cottage cheese  °Stuffed fish Junior baby fruit  °Baby food meals Semi thickened juices  °Minced soft cooked, scrambled, poached eggs nectars  °Souffle & omelets Ripe mashed bananas  °Cooked  cereals Canned fruit, pineapple sauce, milk  °potatoes Milkshake  °Buttered or Alfredo noodles Custard  °Cooked cooled vegetable Puddings, including tapioca  °Sherbet Yogurt  °Vegetable soup or alphabet soup Fruit ice, Italian ice  °Gravies Whipped gelatin  °Sugar, syrup, honey, jelly Junior baby desserts  °Sauces:  Cheese, creamed, barbecue, tomato, white Cream  °Coffee or tea Margarine  ° °SAMPLE MENU:  LEVEL 2 °Breakfast Lunch Dinner  °· Orange juice, 1/2 cup °· Oatmeal, 1/2 cup °· Scrambled eggs with cheese, 1/2 cup °· Decaffeinated tea, 1 cup °· Whole milk, 1 cup °· Non-dairy creamer, 2 Tbsp · Pineapple juice, 1/2 cup °· Minced beef, 3 oz °· Gravy, 2 Tbsp °· Mashed potatoes, 1/2 cup °· Minced fresh broccoli, 1/2 cup °· Applesauce, 1/2 cup °· Coffee, 1 cup · Turkey, barley soup, 3/4 cup °·   Minced Hawaiian chicken, 3 oz °· Mashed potatoes, 1/2 cup °· Cooked spinach, 1/2 cup °· Frozen yogurt, 1/2 cup °· Non-dairy creamer, 2 Tbsp  ° ° ° ° °LEVEL 3 = CHOPPED DIET ° °-After all the foods in level 2 (soft diet) are passing through well you should advance up to more chopped foods.  °-It is still important to cut these foods into small pieces and eat slowly. ° °Hot Foods Cold Foods  °Poultry Cottage cheese  °Chopped Swedish meatballs Yogurt  °Meat salads (ground or flaked meat) Milk  °Flaked fish (tuna) Milkshakes  °Poached or scrambled eggs Soft, cold, dry cereal  °Souffles and omelets Fruit juices or nectars  °Cooked cereals Chopped canned fruit  °Chopped French toast or pancakes Canned fruit cocktail  °Noodles or pasta (no rice) Pudding, mousse, custard  °Cooked vegetables (no frozen peas, corn, or mixed vegetables) Green salad  °Canned small sweet peas Ice cream  °Creamed soup or vegetable soup Fruit ice, Italian ice  °Pureed vegetable soup or alphabet soup Non-dairy creamer  °Ground scalloped apples Margarine  °Gravies Mayonnaise  °Sauces:  Cheese, creamed, barbecue, tomato, white Ketchup  °Coffee or tea Mustard   ° °SAMPLE MENU:  LEVEL 3 °Breakfast Lunch Dinner  °· Orange juice, 1/2 cup °· Oatmeal, 1/2 cup °· Scrambled eggs with cheese, 1/2 cup °· Decaffeinated tea, 1 cup °· Whole milk, 1 cup °· Non-dairy creamer, 2 Tbsp °· Ketchup, 1 Tbsp °· Margarine, 1 tsp °· Salt, 1/4 tsp °· Sugar, 2 tsp · Pineapple juice, 1/2 cup °· Ground beef, 3 oz °· Gravy, 2 Tbsp °· Mashed potatoes, 1/2 cup °· Cooked spinach, 1/2 cup °· Applesauce, 1/2 cup °· Decaffeinated coffee °· Whole milk °· Non-dairy creamer, 2 Tbsp °· Margarine, 1 tsp °· Salt, 1/4 tsp · Pureed turkey, barley soup, 3/4 cup °· Barbecue chicken, 3 oz °· Mashed potatoes, 1/2 cup °· Ground fresh broccoli, 1/2 cup °· Frozen yogurt, 1/2 cup °· Decaffeinated tea, 1 cup °· Non-dairy creamer, 2 Tbsp °· Margarine, 1 tsp °· Salt, 1/4 tsp °· Sugar, 1 tsp  ° ° °LEVEL 4:  REGULAR FOODS ° °-Foods in this group are soft, moist, regularly textured foods.   °-This level includes meat and breads, which tend to be the hardest things to swallow.   °-Eat very slowly, chew well and continue to avoid carbonated drinks. °-most people are at this level in 4-6 weeks ° °Hot Foods Cold Foods  °Baked fish or skinned Soft cheeses - cottage cheese  °Souffles and omelets Cream cheese  °Eggs Yogurt  °Stuffed shells Milk  °Spaghetti with meat sauce Milkshakes  °Cooked cereal Cold dry cereals (no nuts, dried fruit, coconut)  °French toast or pancakes Crackers  °Buttered toast Fruit juices or nectars  °Noodles or pasta (no rice) Canned fruit  °Potatoes (all types) Ripe bananas  °Soft, cooked vegetables (no corn, lima, or baked beans) Peeled, ripe, fresh fruit  °Creamed soups or vegetable soup Cakes (no nuts, dried fruit, coconut)  °Canned chicken noodle soup Plain doughnuts  °Gravies Ice cream  °Bacon dressing Pudding, mousse, custard  °Sauces:  Cheese, creamed, barbecue, tomato, white Fruit ice, Italian ice, sherbet  °Decaffeinated tea or coffee Whipped gelatin  °Pork chops Regular gelatin  ° Canned fruited  gelatin molds  ° Sugar, syrup, honey, jam, jelly  ° Cream  ° Non-dairy  ° Margarine  ° Oil  ° Mayonnaise  ° Ketchup  ° Mustard  ° °TROUBLESHOOTING IRREGULAR BOWELS  °1) Avoid extremes of bowel   movements (no bad constipation/diarrhea)  °2) Miralax 17gm mixed in 8oz. water or juice-daily. May use BID as needed.  °3) Gas-x,Phazyme, etc. as needed for gas & bloating.  °4) Soft,bland diet. No spicy,greasy,fried foods.  °5) Prilosec over-the-counter as needed  °6) May hold gluten/wheat products from diet to see if symptoms improve.  °7) May try probiotics (Align, Activa, etc) to help calm the bowels down  °7) If symptoms become worse call back immediately. ° ° ° °If you have any questions please call our office at [facility]: [phone]. ° °

## 2017-12-26 NOTE — Anesthesia Procedure Notes (Signed)
Procedure Name: Intubation Date/Time: 12/26/2017 7:27 AM Performed by: Victoriano Lain, CRNA Pre-anesthesia Checklist: Patient identified, Emergency Drugs available, Suction available, Patient being monitored and Timeout performed Patient Re-evaluated:Patient Re-evaluated prior to induction Oxygen Delivery Method: Circle system utilized Preoxygenation: Pre-oxygenation with 100% oxygen Induction Type: Rapid sequence and Cricoid Pressure applied Laryngoscope Size: Mac and 4 Grade View: Grade I Tube type: Oral Tube size: 7.5 mm Number of attempts: 1 Airway Equipment and Method: Stylet Secured at: 21 cm Tube secured with: Tape Dental Injury: Teeth and Oropharynx as per pre-operative assessment  Comments: RSI due to patient's history of GERD and hiatal hernia with cricoid pressure by Dr. Ola Spurr. Grade 1 view with MAC 4. ATOI.

## 2017-12-26 NOTE — Anesthesia Postprocedure Evaluation (Signed)
Anesthesia Post Note  Patient: Edwena FeltyBrandi D Sandberg  Procedure(s) Performed: XI ROBOTIC HIATAL HERNIA REPAIR AND ESOPHAGEAL WRAP AND PYLOROPLASTY (N/A Abdomen) INSERTION OF BIO A MESH (N/A )     Patient location during evaluation: PACU Anesthesia Type: General Level of consciousness: awake and alert Pain management: pain level controlled Vital Signs Assessment: post-procedure vital signs reviewed and stable Respiratory status: spontaneous breathing, nonlabored ventilation, respiratory function stable and patient connected to nasal cannula oxygen Cardiovascular status: blood pressure returned to baseline and stable Postop Assessment: no apparent nausea or vomiting Anesthetic complications: no    Last Vitals:  Vitals:   12/26/17 1100 12/26/17 1115  BP: (!) 148/86 (!) 145/81  Pulse: 64 60  Resp: 14 16  Temp: 36.7 C 36.6 C  SpO2: 100% 100%    Last Pain:  Vitals:   12/26/17 1100  TempSrc:   PainSc: 2                  Kennieth RadFitzgerald, Coner Gibbard E

## 2017-12-26 NOTE — Transfer of Care (Signed)
Immediate Anesthesia Transfer of Care Note  Patient: Carla FeltyBrandi D Lucena  Procedure(s) Performed: XI ROBOTIC HIATAL HERNIA REPAIR AND ESOPHAGEAL WRAP AND PYLOROPLASTY (N/A Abdomen) INSERTION OF BIO A MESH (N/A )  Patient Location: PACU  Anesthesia Type:General  Level of Consciousness: awake, alert , oriented and patient cooperative  Airway & Oxygen Therapy: Patient Spontanous Breathing and Patient connected to face mask oxygen  Post-op Assessment: Report given to RN, Post -op Vital signs reviewed and stable and Patient moving all extremities  Post vital signs: Reviewed and stable  Last Vitals:  Vitals Value Taken Time  BP 147/110 12/26/2017 10:12 AM  Temp    Pulse 91 12/26/2017 10:14 AM  Resp 19 12/26/2017 10:14 AM  SpO2 100 % 12/26/2017 10:14 AM  Vitals shown include unvalidated device data.  Last Pain:  Vitals:   12/26/17 0548  TempSrc: Oral  PainSc: 0-No pain         Complications: No apparent anesthesia complications

## 2017-12-26 NOTE — Interval H&P Note (Signed)
History and Physical Interval Note:  12/26/2017 7:03 AM  [name]  has presented today for surgery, with the diagnosis of hiatal hernia  The various methods of treatment have been discussed with the patient and family. After consideration of risks, benefits and other options for treatment, the patient has consented to  Procedure(s): XI ROBOTIC HIATAL HERNIA REPAIR AND ESOPHAGEAL WRAP (N/A) INSERTION OF BIO A MESH (N/A) as a surgical intervention .  The patient's history has been reviewed, patient examined, no change in status, stable for surgery.  I have reviewed the patient's chart and labs.  Questions were answered to the patient's satisfaction.     [name] Jr., Jed LimerickArmando

## 2017-12-27 ENCOUNTER — Observation Stay (HOSPITAL_COMMUNITY): Payer: Managed Care, Other (non HMO)

## 2017-12-27 ENCOUNTER — Encounter (HOSPITAL_COMMUNITY): Payer: Self-pay | Admitting: General Surgery

## 2017-12-27 DIAGNOSIS — K449 Diaphragmatic hernia without obstruction or gangrene: Secondary | ICD-10-CM | POA: Diagnosis not present

## 2017-12-27 LAB — CBC
HEMATOCRIT: 32.8 % — AB (ref 36.0–46.0)
Hemoglobin: 10.7 g/dL — ABNORMAL LOW (ref 12.0–15.0)
MCH: 28.8 pg (ref 26.0–34.0)
MCHC: 32.6 g/dL (ref 30.0–36.0)
MCV: 88.2 fL (ref 78.0–100.0)
PLATELETS: 279 10*3/uL (ref 150–400)
RBC: 3.72 MIL/uL — AB (ref 3.87–5.11)
RDW: 13.3 % (ref 11.5–15.5)
WBC: 18.1 10*3/uL — AB (ref 4.0–10.5)

## 2017-12-27 LAB — BASIC METABOLIC PANEL
Anion gap: 7 (ref 5–15)
BUN: 13 mg/dL (ref 6–20)
CHLORIDE: 108 mmol/L (ref 98–111)
CO2: 28 mmol/L (ref 22–32)
Calcium: 8.3 mg/dL — ABNORMAL LOW (ref 8.9–10.3)
Creatinine, Ser: 0.73 mg/dL (ref 0.44–1.00)
Glucose, Bld: 120 mg/dL — ABNORMAL HIGH (ref 70–99)
POTASSIUM: 3.3 mmol/L — AB (ref 3.5–5.1)
SODIUM: 143 mmol/L (ref 135–145)

## 2017-12-27 NOTE — Progress Notes (Signed)
1 Day Post-Op   Subjective/Chief Complaint: Feeling a little sore today Some slight nausea with pain rx   Objective: Vital signs in last 24 hours: Temp:  [97.5 F (36.4 C)-98.7 F (37.1 C)] 98.7 F (37.1 C) (07/10 0604) Pulse Rate:  [58-91] 84 (07/10 0604) Resp:  [12-19] 17 (07/10 0604) BP: (131-159)/(81-110) 134/84 (07/10 0604) SpO2:  [95 %-100 %] 97 % (07/10 0604) Last BM Date: 12/25/17  Intake/Output from previous day: 07/09 0701 - 07/10 0700 In: 3569.4 [I.V.:3469.4; IV Piggyback:100] Out: 1130 [Urine:1100; Blood:30] Intake/Output this shift: No intake/output data recorded.  Gen: AAOx3 Abd: soft, approp ttp, nd, inc c/d/i   Lab Results:  Recent Labs    12/27/17 0434  WBC 18.1*  HGB 10.7*  HCT 32.8*  PLT 279   BMET Recent Labs    12/27/17 0434  NA 143  K 3.3*  CL 108  CO2 28  GLUCOSE 120*  BUN 13  CREATININE 0.73  CALCIUM 8.3*   Anti-infectives: Anti-infectives (From admission, onward)   Start     Dose/Rate Route Frequency Ordered Stop   12/26/17 0600  ceFAZolin (ANCEF) IVPB 2g/100 mL premix     2 g 200 mL/hr over 30 Minutes Intravenous On call to O.R. 12/26/17 0541 12/26/17 0759      Assessment/Plan: s/p Procedure(s): XI ROBOTIC HIATAL HERNIA REPAIR AND ESOPHAGEAL WRAP AND PYLOROPLASTY (N/A) INSERTION OF BIO A MESH (N/A) Plan for esophagogram today Mobilize Pain control Home today vs tomorrow  LOS: 0 days    [name] Jr., Jed LimerickArmando 12/27/2017

## 2017-12-27 NOTE — Plan of Care (Signed)
Nutrition Education Note  RD consulted for nutrition education regarding patient who is s/p nissen fundoplication. Pt to be on clear liquid diet for 2 weeks.  RD provided handout on Nissen Fundoplication nutrition therapy. Reviewed clear and full liquids. Encouraged pt to avoid caffeine, carbonated beverages and use of straws.  Provided examples of appropriate foods on a soft diet. Reviewed gas producing foods. Discouraged intake of processed foods and red meat. Recommended use of a liquid multivitamin while following a liquid diet. Reviewed acceptable protein supplements.  RD discussed why it is important for patient to adhere to diet recommendations. Teach back method used.  Expect good compliance.   Body mass index is 19.5 kg/m.  Pt meets criteria for normal based on current BMI.  Current diet order is NPO. Labs and medications reviewed. No further nutrition interventions warranted at this time. If additional nutrition issues arise, please re-consult RD.   Carla FrancoLindsey Mordche Hedglin, MS, RD, LDN [facility] Clinical Dietitian Pager: [phone] After Hours Pager: [phone]

## 2017-12-28 DIAGNOSIS — K449 Diaphragmatic hernia without obstruction or gangrene: Secondary | ICD-10-CM | POA: Diagnosis not present

## 2017-12-28 NOTE — Discharge Summary (Signed)
Physician Discharge Summary  Patient ID: [name] MRN: [id number] DOB/AGE: 48/02/1971 48 y.o.  Admit date: 12/26/2017 Discharge date: 12/28/2017  Admission Diagnoses:hiatal hernia and GERD  Discharge Diagnoses:  Active Problems:   S/P Nissen fundoplication (without gastrostomy tube) procedure   Discharged Condition: fair  Hospital Course: Pt underwent robotic toupet fundoplication and hiatal hernia repair with mesh.  Pt was admitted post op to the floor.  She did well from a pain standpoint.  She underwent esophagram on POD 1 and it showed no leaks.  She was started on a FLD and tolerated that well. Pt stated she had no GERD while lying flat at night.  She was ambulating well on her own.  She was afebrile, deemed stable for DC and DC'd home.  Consults: None  Significant Diagnostic Studies: Esophagram showing no leaks  Treatments: surgery: as above  Discharge Exam: Blood pressure 127/80, pulse 80, temperature 97.9 F (36.6 C), temperature source Oral, resp. rate 16, height 5' 7.5" (1.715 m), weight 57.3 kg (126 lb 6 oz), SpO2 91 %. General appearance: alert and cooperative GI: soft, non-tender; bowel sounds normal; no masses,  no organomegaly and inc c/d/i  Disposition: Discharge disposition: 01-Home or Self Care       Discharge Instructions    Diet - low sodium heart healthy   Complete by:  As directed    Increase activity slowly   Complete by:  As directed      Allergies as of 12/28/2017      Reactions   Guaifenesin Shortness Of Breath   Iron Anaphylaxis, Other (See Comments)   IV only   Codeine Other (See Comments)   tiredness   Doxepin Hives   Hydromorphone Hcl Nausea And Vomiting   Metoprolol Succinate Other (See Comments)   REACTION: edema   Morphine Other (See Comments)   Does not work; hallucinations and irritable   Pantoprazole Sodium Other (See Comments)   heartburn   Pseudoeph-doxylamine-dm-apap Hives   Pseudoephedrine Hives   Sulfa  Antibiotics Hives   Prednisone Rash   Tetracyclines & Related Rash      Medication List    TAKE these medications   allopurinol 100 MG tablet Commonly known as:  ZYLOPRIM Take 1 tablet (100 mg total) by mouth daily. Must keep visit on 11/03/17 to get refills What changed:    when to take this  reasons to take this  additional instructions   aspirin-acetaminophen-caffeine 250-250-65 MG tablet Commonly known as:  EXCEDRIN MIGRAINE Take by mouth every 6 (six) hours as needed for headache.   bisacodyl 5 MG EC tablet Commonly known as:  DULCOLAX Take 5 mg by mouth daily as needed for moderate constipation.   diazepam 5 MG tablet Commonly known as:  VALIUM take 1 tablet by mouth every 12 hours if needed for anxiety What changed:    how much to take  how to take this  when to take this  reasons to take this  additional instructions   hydrochlorothiazide 25 MG tablet Commonly known as:  HYDRODIURIL Take 25 mg by mouth every morning.   IRON 27 240 (27 FE) MG tablet Generic drug:  ferrous gluconate Take 240 mg by mouth every morning.   MULTIVITAMIN ADULTS PO Take 1 tablet by mouth every morning.   potassium chloride SA 20 MEQ tablet Commonly known as:  K-DUR,KLOR-CON Take 20 mEq by mouth every morning.   Vitamin D3 2000 units Tabs Take 2,000 Units by mouth every morning.   ZEGERID OTC  20-1100 MG Caps capsule Generic drug:  Omeprazole-Sodium Bicarbonate Take 4 capsules by mouth every evening.      Follow-up Information    [name], MD. Schedule an appointment as soon as possible for a visit in 2 weeks.   Specialty:  General Surgery Why:  For wound re-check Contact information: [address] [place] [address] [phone]           Signed: Marigene Ehlers., Jed Limerick 12/28/2017, 8:29 AM

## 2017-12-28 NOTE — Progress Notes (Signed)
Discharge instructions reviewed with patient. All questions answered. Patient wheeled to vehicle with belongings by nurse tech. 

## 2018-03-31 ENCOUNTER — Ambulatory Visit (INDEPENDENT_AMBULATORY_CARE_PROVIDER_SITE_OTHER): Payer: Managed Care, Other (non HMO) | Admitting: Family Medicine

## 2018-03-31 ENCOUNTER — Encounter: Payer: Self-pay | Admitting: Family Medicine

## 2018-03-31 VITALS — BP 122/80 | HR 91 | Temp 97.9°F | Wt 111.0 lb

## 2018-03-31 DIAGNOSIS — R197 Diarrhea, unspecified: Secondary | ICD-10-CM | POA: Diagnosis not present

## 2018-03-31 NOTE — Patient Instructions (Signed)
Plenty of liquids.  Imodium (loperamide) per instructions as needed  No dairy for 1-2 weeks  I hope you are feeling better soon! Seek care promptly if your symptoms worsen, new concerns arise or you are not improving with treatment.

## 2018-03-31 NOTE — Progress Notes (Signed)
HPI:  Using dictation device. Unfortunately this device frequently misinterprets words/phrases.  Acute visit for Diarrhea: -started 3 days ago -symptoms include nausea and diarrhea, a few episodes of watery diarrhea the first day, 1 episode of watery diarrhea yesterday, no diarrhea today yet -denies: fevers, abd pain, hematochezia, melena, emesis, malaise, recent travel, recent abx  ROS: See pertinent positives and negatives per HPI.  Past Medical History:  Diagnosis Date  . Allergic rhinitis   . Anxiety   . B12 deficiency   . Barrett's esophagus   . Chronic constipation   . 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 Depression   . GERD (gastroesophageal reflux disease)   . Hiatal hernia   . History of DVT of lower extremity    08-14-2003  RIGHT LOWER EXTREMITY-- TREATED W/ COUMADIN----  per pt none since  . History of palpitations   . Hypertension   . Idiopathic chronic gout    12-20-2017  per pt stable ,   last episode 02/ 2019  . Iron deficiency anemia   . Migraines   . Personal history of PE (pulmonary embolism)    03/ 2005 RIGHT SIDE  --- treated w/ coumadin---  per pt none since  . PONV (postoperative nausea and vomiting)   . Tinea versicolor   . Vitamin D deficiency     Past Surgical History:  Procedure Laterality Date  . BIOPSY  11/29/2017   Procedure: BIOPSY;  Surgeon: [name], MD;  Location: [facility];  Service: Endoscopy;;  . BRONCHOSCOPY  05-20-2004  dr clance  . ESOPHAGOGASTRODUODENOSCOPY (EGD) WITH PROPOFOL N/A 11/29/2017   Procedure: ESOPHAGOGASTRODUODENOSCOPY (EGD) WITH PROPOFOL;  Surgeon: [name], MD;  Location: [facility];  Service: Endoscopy;  Laterality: N/A;  . EXPLORATORY LAPAROTOMY  AGE 6    [facility]   for constipation  . INSERTION OF MESH N/A 12/26/2017   Procedure: INSERTION OF BIO A MESH;  Surgeon: [name], MD;  Location: [facility];  Service: General;  Laterality: N/A;  . KNEE ARTHROSCOPY W/ ACL  RECONSTRUCTION Right 09/2003  . LAPAROSCOPY BILATERAL TUBAL FULGERATION AND ATTEMPTED THERMACHOICE ABLATION WITH UTERINE PERFORATION  07-17-2009   dr [name]  [facility]  . LUNG LOBECTOMY  1991   right lower lobe for massive hemoptyosis  . MINI-THORACOTOMY WEDGE RESECTION OF LEFT LOWER LOBE CYST Left 06-24-2004   dr Edwyna Shell [facility]   hemoptyosis    Family History  Problem Relation Age of Onset  . Hypertension Other   . Diabetes Other     SOCIAL HX: see hpi   Current Outpatient Medications:  .  allopurinol (ZYLOPRIM) 100 MG tablet, Take 1 tablet (100 mg total) by mouth daily. Must keep visit on 11/03/17 to get refills (Patient taking differently: Take 100 mg by mouth daily as needed (for gout flare up). Must keep visit on 11/03/17 to get refills), Disp: 90 tablet, Rfl: 3 .  aspirin-acetaminophen-caffeine (EXCEDRIN MIGRAINE) 250-250-65 MG tablet, Take by mouth every 6 (six) hours as needed for headache., Disp: , Rfl:  .  bisacodyl (DULCOLAX) 5 MG EC tablet, Take 5 mg by mouth daily as needed for moderate constipation., Disp: , Rfl:  .  Cholecalciferol (VITAMIN D3) 2000 units TABS, Take 2,000 Units by mouth every morning. , Disp: , Rfl:  .  diazepam (VALIUM) 5 MG tablet, take 1 tablet by mouth every 12 hours if needed for anxiety (Patient taking differently: Take 5 mg by mouth every 12 (twelve) hours as  needed for anxiety. ), Disp: 30 tablet, Rfl: 1 .  ferrous gluconate (IRON 27) 240 (27 FE) MG tablet, Take 240 mg by mouth every morning. , Disp: , Rfl:  .  hydrochlorothiazide (HYDRODIURIL) 25 MG tablet, Take 25 mg by mouth every morning. , Disp: , Rfl:  .  Multiple Vitamins-Minerals (MULTIVITAMIN ADULTS PO), Take 1 tablet by mouth every morning. , Disp: , Rfl:  .  potassium chloride SA (K-DUR,KLOR-CON) 20 MEQ tablet, Take 20 mEq by mouth every morning. , Disp: , Rfl:   EXAM:  Vitals:   03/31/18 0953  BP: 122/80  Pulse: 91  Temp: 97.9 F (36.6 C)  SpO2: 97%    Body mass index is 17.13  kg/m.  GENERAL: vitals reviewed and listed above, alert, oriented, appears well hydrated and in no acute distress  HEENT: atraumatic, conjunttiva clear, no obvious abnormalities on inspection of external nose and ears  NECK: no obvious masses on inspection  LUNGS: clear to auscultation bilaterally, no wheezes, rales or rhonchi, good air movement  CV: HRRR, no peripheral edema  ABD: BS+, soft, NTTP, no rebound or guarding  MS: moves all extremities without noticeable abnormality  PSYCH: pleasant and cooperative, no obvious depression or anxiety  ASSESSMENT AND PLAN:  Discussed the following assessment and plan:  Diarrhea, unspecified type  -we discussed possible serious and likely etiologies, workup and treatment, treatment risks and return precautions - suspect mild gastroenteritis most likely vs other - seems to be doing better today -after this discussion, Merrick opted for imodium, hydration, no dairy for 1-2 weeks -follow up advised as needed of symptoms persist, worsening or new concerns    Patient Instructions  Plenty of liquids.  Imodium (loperamide) per instructions as needed  No dairy for 1-2 weeks  I hope you are feeling better soon! Seek care promptly if your symptoms worsen, new concerns arise or you are not improving with treatment.     Terressa Koyanagi, DO

## 2018-04-11 ENCOUNTER — Ambulatory Visit (INDEPENDENT_AMBULATORY_CARE_PROVIDER_SITE_OTHER): Payer: Managed Care, Other (non HMO) | Admitting: Internal Medicine

## 2018-04-11 ENCOUNTER — Encounter: Payer: Self-pay | Admitting: Internal Medicine

## 2018-04-11 DIAGNOSIS — R197 Diarrhea, unspecified: Secondary | ICD-10-CM | POA: Insufficient documentation

## 2018-04-11 MED ORDER — CIPROFLOXACIN HCL 500 MG PO TABS: 500.0000 mg | ORAL_TABLET | Freq: Two times a day (BID) | ORAL | 0 refills | Status: DC

## 2018-04-11 NOTE — Progress Notes (Signed)
   Subjective:    Patient ID: [name], female    DOB: Jul 25, 1970, 47 y.o.   MRN: [id number]  HPI The patient is a 47 YO female coming in for about 2 weeks of diarrhea. She denies blood in stool. Usually 1-3 episodes of diarrhea daily. Mostly liquid and rarely partially formed. Started after eating some salmon and she is not sure if this is related. She was in the hospital June for surgery on hiatal hernia but this is healed now. She denies antibiotics in the last 3-4 months. She denies change in medication prior to onset. Denies pets. Denies camping or outdoor exposure. Denies fevers or chills during this time. Denies abdominal pain. Denies nausea or vomiting and is eating and drinking normally. Has not tried anything for this including imodium over the counter. Overall it is just not improving and had 3 episodes yesterday.   Review of Systems  Constitutional: Negative.   HENT: Negative.   Eyes: Negative.   Respiratory: Negative for cough, chest tightness and shortness of breath.   Cardiovascular: Negative for chest pain, palpitations and leg swelling.  Gastrointestinal: Positive for diarrhea. Negative for abdominal distention, abdominal pain, blood in stool, constipation, nausea and vomiting.  Musculoskeletal: Negative.   Skin: Negative.   Neurological: Negative.   Psychiatric/Behavioral: Negative.       Objective:   Physical Exam  Constitutional: She is oriented to person, place, and time. She appears well-developed and well-nourished.  HENT:  Head: Normocephalic and atraumatic.  Eyes: EOM are normal.  Neck: Normal range of motion.  Cardiovascular: Normal rate and regular rhythm.  Pulmonary/Chest: Effort normal and breath sounds normal. No respiratory distress. She has no wheezes. She has no rales.  Abdominal: Soft. Bowel sounds are normal. She exhibits no distension. There is no tenderness. There is no rebound.  Musculoskeletal: She exhibits no edema.  Neurological: She is  alert and oriented to person, place, and time.  Skin: Skin is warm and dry.  Psychiatric: She has a normal mood and affect.   Vitals:   04/11/18 1041  BP: 130/90  Pulse: 99  Temp: 97.6 F (36.4 C)  TempSrc: Oral  SpO2: 99%  Weight: 116 lb (52.6 kg)  Height: 5' 7.5" (1.715 m)      Assessment & Plan:

## 2018-04-11 NOTE — Patient Instructions (Signed)
We have sent in ciprofloxacin to take 1 pill twice a day for 5 days.   

## 2018-04-11 NOTE — Assessment & Plan Note (Addendum)
Rx for cipro in case of bacterial infection given lack of improvement. Advised that she can use imodium. No high risk for giardia or c dif. If no resolution will check GI pathogen panel. Talked to her about probiotics.

## 2018-05-11 ENCOUNTER — Ambulatory Visit: Payer: Managed Care, Other (non HMO) | Admitting: Internal Medicine

## 2018-05-16 ENCOUNTER — Other Ambulatory Visit: Payer: Self-pay | Admitting: Internal Medicine

## 2018-05-16 NOTE — Telephone Encounter (Signed)
[place] Controlled Database Checked Last filled: 01/12/18 # 30 LOV w/you: 11/03/17  Next appt w/you: None

## 2018-05-20 DIAGNOSIS — S069X9A Unspecified intracranial injury with loss of consciousness of unspecified duration, initial encounter: Secondary | ICD-10-CM

## 2018-05-20 HISTORY — DX: Unspecified intracranial injury with loss of consciousness of unspecified duration, initial encounter: S06.9X9A

## 2018-06-13 ENCOUNTER — Encounter (HOSPITAL_COMMUNITY): Payer: Self-pay

## 2018-06-13 ENCOUNTER — Observation Stay (HOSPITAL_COMMUNITY): Payer: 59

## 2018-06-13 ENCOUNTER — Inpatient Hospital Stay (HOSPITAL_COMMUNITY)
Admission: EM | Admit: 2018-06-13 | Discharge: 2018-06-16 | DRG: 178 | Disposition: A | Discharge status: Home / Self Care | Payer: 59 | Attending: Internal Medicine | Admitting: Internal Medicine

## 2018-06-13 ENCOUNTER — Emergency Department (HOSPITAL_COMMUNITY): Payer: 59

## 2018-06-13 ENCOUNTER — Other Ambulatory Visit: Payer: Self-pay

## 2018-06-13 DIAGNOSIS — E440 Moderate protein-calorie malnutrition: Secondary | ICD-10-CM

## 2018-06-13 DIAGNOSIS — J432 Centrilobular emphysema: Secondary | ICD-10-CM | POA: Diagnosis present

## 2018-06-13 DIAGNOSIS — R778 Other specified abnormalities of plasma proteins: Secondary | ICD-10-CM | POA: Diagnosis present

## 2018-06-13 DIAGNOSIS — J690 Pneumonitis due to inhalation of food and vomit: Principal | ICD-10-CM | POA: Diagnosis present

## 2018-06-13 DIAGNOSIS — R55 Syncope and collapse: Secondary | ICD-10-CM | POA: Diagnosis present

## 2018-06-13 DIAGNOSIS — I248 Other forms of acute ischemic heart disease: Secondary | ICD-10-CM | POA: Diagnosis present

## 2018-06-13 DIAGNOSIS — Z882 Allergy status to sulfonamides status: Secondary | ICD-10-CM

## 2018-06-13 DIAGNOSIS — I10 Essential (primary) hypertension: Secondary | ICD-10-CM | POA: Diagnosis not present

## 2018-06-13 DIAGNOSIS — R042 Hemoptysis: Secondary | ICD-10-CM | POA: Diagnosis present

## 2018-06-13 DIAGNOSIS — K219 Gastro-esophageal reflux disease without esophagitis: Secondary | ICD-10-CM | POA: Diagnosis present

## 2018-06-13 DIAGNOSIS — Y95 Nosocomial condition: Secondary | ICD-10-CM | POA: Diagnosis present

## 2018-06-13 DIAGNOSIS — J439 Emphysema, unspecified: Secondary | ICD-10-CM

## 2018-06-13 DIAGNOSIS — J189 Pneumonia, unspecified organism: Secondary | ICD-10-CM | POA: Diagnosis present

## 2018-06-13 DIAGNOSIS — J984 Other disorders of lung: Secondary | ICD-10-CM | POA: Diagnosis present

## 2018-06-13 DIAGNOSIS — Z885 Allergy status to narcotic agent status: Secondary | ICD-10-CM

## 2018-06-13 DIAGNOSIS — J181 Lobar pneumonia, unspecified organism: Secondary | ICD-10-CM | POA: Diagnosis not present

## 2018-06-13 DIAGNOSIS — Z681 Body mass index (BMI) 19 or less, adult: Secondary | ICD-10-CM

## 2018-06-13 DIAGNOSIS — Z86718 Personal history of other venous thrombosis and embolism: Secondary | ICD-10-CM

## 2018-06-13 DIAGNOSIS — Z79899 Other long term (current) drug therapy: Secondary | ICD-10-CM

## 2018-06-13 DIAGNOSIS — R7989 Other specified abnormal findings of blood chemistry: Secondary | ICD-10-CM

## 2018-06-13 DIAGNOSIS — R1310 Dysphagia, unspecified: Secondary | ICD-10-CM

## 2018-06-13 DIAGNOSIS — Z86711 Personal history of pulmonary embolism: Secondary | ICD-10-CM

## 2018-06-13 DIAGNOSIS — R4702 Dysphasia: Secondary | ICD-10-CM | POA: Diagnosis present

## 2018-06-13 DIAGNOSIS — R51 Headache: Secondary | ICD-10-CM | POA: Diagnosis present

## 2018-06-13 DIAGNOSIS — K2270 Barrett's esophagus without dysplasia: Secondary | ICD-10-CM | POA: Diagnosis present

## 2018-06-13 DIAGNOSIS — Z9889 Other specified postprocedural states: Secondary | ICD-10-CM | POA: Diagnosis present

## 2018-06-13 DIAGNOSIS — Z888 Allergy status to other drugs, medicaments and biological substances status: Secondary | ICD-10-CM

## 2018-06-13 DIAGNOSIS — R930 Abnormal findings on diagnostic imaging of skull and head, not elsewhere classified: Secondary | ICD-10-CM

## 2018-06-13 DIAGNOSIS — E876 Hypokalemia: Secondary | ICD-10-CM | POA: Diagnosis present

## 2018-06-13 LAB — BASIC METABOLIC PANEL
Anion gap: 15 (ref 5–15)
BUN: 19 mg/dL (ref 6–20)
CALCIUM: 9.5 mg/dL (ref 8.9–10.3)
CO2: 22 mmol/L (ref 22–32)
CREATININE: 0.81 mg/dL (ref 0.44–1.00)
Chloride: 105 mmol/L (ref 98–111)
GFR calc Af Amer: >60 mL/min (ref >60)
Glucose, Bld: 96 mg/dL (ref 70–99)
Potassium: 3.4 mmol/L — ABNORMAL LOW (ref 3.5–5.1)
Sodium: 142 mmol/L (ref 135–145)

## 2018-06-13 LAB — I-STAT BETA HCG BLOOD, ED (MC, WL, AP ONLY): I-stat hCG, quantitative: <5 m[IU]/mL (ref <5)

## 2018-06-13 LAB — CBC
HCT: 39.4 % (ref 36.0–46.0)
HEMATOCRIT: 44 % (ref 36.0–46.0)
Hemoglobin: 14.5 g/dL (ref 12.0–15.0)
Hemoglobin: 12.9 g/dL (ref 12.0–15.0)
MCH: 29.6 pg (ref 26.0–34.0)
MCH: 29.7 pg (ref 26.0–34.0)
MCHC: 33 g/dL (ref 30.0–36.0)
MCHC: 32.7 g/dL (ref 30.0–36.0)
MCV: 89.8 fL (ref 80.0–100.0)
MCV: 90.6 fL (ref 80.0–100.0)
NRBC: 0 % (ref 0.0–0.2)
PLATELETS: 338 10*3/uL (ref 150–400)
PLATELETS: 323 10*3/uL (ref 150–400)
RBC: 4.9 MIL/uL (ref 3.87–5.11)
RBC: 4.35 MIL/uL (ref 3.87–5.11)
RDW: 13.3 % (ref 11.5–15.5)
RDW: 13.4 % (ref 11.5–15.5)
WBC: 26.8 10*3/uL — AB (ref 4.0–10.5)
WBC: 28.8 10*3/uL — ABNORMAL HIGH (ref 4.0–10.5)
nRBC: 0 % (ref 0.0–0.2)

## 2018-06-13 LAB — RAPID URINE DRUG SCREEN, HOSP PERFORMED
AMPHETAMINES: NOT DETECTED
Barbiturates: NOT DETECTED
Benzodiazepines: NOT DETECTED
Cocaine: NOT DETECTED
Opiates: NOT DETECTED
TETRAHYDROCANNABINOL: NOT DETECTED

## 2018-06-13 LAB — TROPONIN I: Troponin I: 0.44 ng/mL (ref <0.03)

## 2018-06-13 MED ORDER — IOPAMIDOL (ISOVUE-370) INJECTION 76%
INTRAVENOUS | Status: AC
  Filled 2018-06-13: qty 100

## 2018-06-13 MED ORDER — SODIUM CHLORIDE (PF) 0.9 % IJ SOLN
INTRAMUSCULAR | Status: AC
  Filled 2018-06-13: qty 50

## 2018-06-13 MED ORDER — FERROUS GLUCONATE 324 (38 FE) MG PO TABS
324.0000 mg | ORAL_TABLET | Freq: Every morning | ORAL | Status: DC
  Administered 2018-06-14 – 2018-06-16 (×3): 324 mg via ORAL
  Filled 2018-06-13 (×3): qty 1

## 2018-06-13 MED ORDER — SENNOSIDES-DOCUSATE SODIUM 8.6-50 MG PO TABS: 1.0000 | ORAL_TABLET | Freq: Every day | ORAL | Status: DC | PRN

## 2018-06-13 MED ORDER — ONDANSETRON HCL 4 MG/2ML IJ SOLN
4.0000 mg | Freq: Four times a day (QID) | INTRAMUSCULAR | Status: DC | PRN
  Administered 2018-06-13: 4 mg via INTRAVENOUS
  Filled 2018-06-13: qty 2

## 2018-06-13 MED ORDER — SODIUM CHLORIDE 0.9 % IV SOLN
500.0000 mg | INTRAVENOUS | Status: DC
  Administered 2018-06-14: 500 mg via INTRAVENOUS
  Filled 2018-06-13: qty 500

## 2018-06-13 MED ORDER — SODIUM CHLORIDE 0.9 % IV SOLN
1.0000 g | INTRAVENOUS | Status: DC
  Administered 2018-06-14 – 2018-06-15 (×2): 1 g via INTRAVENOUS
  Filled 2018-06-13 (×2): qty 1
  Filled 2018-06-13: qty 10

## 2018-06-13 MED ORDER — IOPAMIDOL (ISOVUE-370) INJECTION 76%
100.0000 mL | Freq: Once | INTRAVENOUS | Status: AC | PRN
  Administered 2018-06-13: 100 mL via INTRAVENOUS

## 2018-06-13 MED ORDER — POTASSIUM CHLORIDE CRYS ER 20 MEQ PO TBCR: 20.0000 meq | EXTENDED_RELEASE_TABLET | Freq: Every morning | ORAL | Status: DC

## 2018-06-13 MED ORDER — SODIUM CHLORIDE 0.9 % IV SOLN
500.0000 mg | Freq: Once | INTRAVENOUS | Status: AC
  Administered 2018-06-13: 500 mg via INTRAVENOUS
  Filled 2018-06-13: qty 500

## 2018-06-13 MED ORDER — HYDROCHLOROTHIAZIDE 25 MG PO TABS
25.0000 mg | ORAL_TABLET | Freq: Every morning | ORAL | Status: DC
Start: 2018-06-14 — End: 2018-06-16
  Administered 2018-06-14 – 2018-06-16 (×3): 25 mg via ORAL
  Filled 2018-06-13 (×3): qty 1

## 2018-06-13 MED ORDER — DIAZEPAM 5 MG PO TABS
5.0000 mg | ORAL_TABLET | Freq: Two times a day (BID) | ORAL | Status: DC | PRN
  Administered 2018-06-13 – 2018-06-15 (×3): 5 mg via ORAL
  Filled 2018-06-13 (×3): qty 1

## 2018-06-13 MED ORDER — SODIUM CHLORIDE 0.9 % IV SOLN
1.0000 g | Freq: Once | INTRAVENOUS | Status: AC
  Administered 2018-06-13: 1 g via INTRAVENOUS
  Filled 2018-06-13: qty 10

## 2018-06-13 NOTE — ED Triage Notes (Signed)
Pt had syncopal episode at 1230 today. Pt states she was out for 20 minutes. Pt states taht she woke up with a pool of blood from her mouth. Pt states she has had a hx of the same. Pt has had part of her lungs removed. Pt states she has been coughing up bright red blood with clots.

## 2018-06-13 NOTE — Progress Notes (Signed)
CRITICAL VALUE ALERT  Critical Value:  Troponin 0.44  Date & Time Notied:  06/13/2018  2345  Provider Notified: NP Schorr  Orders Received/Actions taken: Pt. Asymptomatic, comfortable in bed. Will  Carry out any new orders.

## 2018-06-13 NOTE — ED Notes (Signed)
Patient transported to X-ray 

## 2018-06-13 NOTE — ED Notes (Signed)
Bed: WA04 Expected date:  Expected time:  Means of arrival:  Comments: 

## 2018-06-13 NOTE — ED Provider Notes (Addendum)
[facility]-EMERGENCY DEPT Provider Note   CSN: [id number] Arrival date & time: 06/13/18  1620     History   Chief Complaint Chief Complaint  Patient presents with  . Hemoptysis  . Loss of Consciousness    HPI [name] is a 47 y.o. female.  HPI 47 yo female with a hx of congenital cystic disease of the lung and a hx of recurrent PNA presents to the ER with complaints of generalized fatigue and weakness x 2 days, followed by lightheadedness, syncope, and when she awoke pt had some hemoptysis. No fevers or vomiting. Hx of syncope without clear etiology in her late 3720s. Hx of PE. No use of anticoagulants now. Hx of recurrent PNA. No unilateral leg swelling.    Past Medical History:  Diagnosis Date  . Allergic rhinitis   . Anxiety   . B12 deficiency   . Barrett's esophagus   . Chronic constipation   . 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 Depression   . GERD (gastroesophageal reflux disease)   . Hiatal hernia   . History of DVT of lower extremity    08-14-2003  RIGHT LOWER EXTREMITY-- TREATED W/ COUMADIN----  per pt none since  . History of palpitations   . Hypertension   . Idiopathic chronic gout    12-20-2017  per pt stable ,   last episode 02/ 2019  . Iron deficiency anemia   . Migraines   . Personal history of PE (pulmonary embolism)    03/ 2005 RIGHT SIDE  --- treated w/ coumadin---  per pt none since  . PONV (postoperative nausea and vomiting)   . Tinea versicolor   . Vitamin D deficiency     Patient Active Problem List   Diagnosis Date Noted  . Diarrhea 04/11/2018  . S/P Nissen fundoplication (without gastrostomy tube) procedure 12/26/2017  . Hiatal hernia 11/30/2017  . Vomiting 11/30/2017  . Hypokalemia 11/30/2017  . Barrett's esophagus 11/28/2017  . Essential hypertension 06/29/2017  . Gingivitis 10/14/2016  . Gout 01/03/2014  . Skin rash 05/14/2013  . Pityriasis rosea 05/14/2013  .  BRONCHITIS 07/09/2010  . VENOUS INSUFFICIENCY, CHRONIC 12/04/2009  . Dermatophytosis of body 12/02/2009  . OTHER SPECIFIED DISEASE OF NAIL 12/02/2009  . OTITIS MEDIA 11/10/2009  . B12 deficiency 10/01/2009  . OTITIS EXTERNA 10/01/2009  . CERUMEN IMPACTION 10/01/2009  . Anxiety state 08/21/2009  . Vitamin D deficiency 02/09/2009  . HYPOKALEMIA 02/09/2009  . DIZZINESS 02/09/2009  . Other malaise and fatigue 02/09/2009  . EDEMA 12/24/2008  . UNSPECIFIED ANEMIA 12/12/2008  . Palpitations 12/12/2008  . UPPER RESPIRATORY INFECTION (URI) 11/18/2008  . SINUSITIS- ACUTE-NOS 09/26/2008  . ACUTE BRONCHOSPASM 09/26/2008  . Depression 09/09/2008  . ONYCHOMYCOSIS 05/26/2008  . Migraine headache 05/26/2008  . Allergic rhinitis 05/26/2008  . Asthma 05/26/2008  . GERD 05/26/2008  . HEMOPTYSIS 01/13/2007  . DVT, HX OF 01/13/2007    Past Surgical History:  Procedure Laterality Date  . BIOPSY  11/29/2017   Procedure: BIOPSY;  Surgeon: Vida RiggerMagod, Marc, MD;  [facility];  Service: Endoscopy;;  . BRONCHOSCOPY  05-20-2004  dr clance  . ESOPHAGOGASTRODUODENOSCOPY (EGD) WITH PROPOFOL N/A 11/29/2017   Procedure: ESOPHAGOGASTRODUODENOSCOPY (EGD) WITH PROPOFOL;  Surgeon: Vida RiggerMagod, Marc, MD;  [facility];  Service: Endoscopy;  Laterality: N/A;  . EXPLORATORY LAPAROTOMY  AGE 51    [facility]   for constipation  . INSERTION OF MESH N/A 12/26/2017  Procedure: INSERTION OF BIO A MESH;  Surgeon: [name], MD;  Location: [facility];  Service: General;  Laterality: N/A;  . KNEE ARTHROSCOPY W/ ACL RECONSTRUCTION Right 09/2003  . LAPAROSCOPY BILATERAL TUBAL FULGERATION AND ATTEMPTED THERMACHOICE ABLATION WITH UTERINE PERFORATION  07-17-2009   dr [name]  [facility]  . LUNG LOBECTOMY  1991   right lower lobe for massive hemoptyosis  . MINI-THORACOTOMY WEDGE RESECTION OF LEFT LOWER LOBE CYST Left 06-24-2004   dr Edwyna Shell [facility]   hemoptyosis     OB History   No obstetric history on file.      Home  Medications    Prior to Admission medications   Medication Sig Start Date End Date Taking? Authorizing Provider  allopurinol (ZYLOPRIM) 100 MG tablet Take 1 tablet (100 mg total) by mouth daily. Must keep visit on 11/03/17 to get refills Patient taking differently: Take 100 mg by mouth daily as needed (for gout flare up). Must keep visit on 11/03/17 to get refills 11/03/17  Yes Plotnikov, Georgina Quint, MD  aspirin-acetaminophen-caffeine (EXCEDRIN MIGRAINE) [phone] MG tablet Take 1 tablet by mouth every 6 (six) hours as needed for headache or migraine.    Yes [provider]  Cholecalciferol (VITAMIN D3) 2000 units TABS Take 2,000 Units by mouth every morning.    Yes [provider]  diazepam (VALIUM) 5 MG tablet TAKE 1 TABLET BY MOUTH EVERY 12 HOURS IF NEEDED FOR ANXIETY Patient taking differently: Take 5 mg by mouth every 12 (twelve) hours as needed for anxiety. take 1 tablet by mouth every 12 hours if needed for anxiety 05/16/18  Yes Plotnikov, Georgina Quint, MD  ferrous gluconate (IRON 27) 240 (27 FE) MG tablet Take 240 mg by mouth every morning.    Yes [provider]  hydrochlorothiazide (HYDRODIURIL) 25 MG tablet Take 25 mg by mouth every morning.    Yes [provider]  Multiple Vitamins-Minerals (MULTIVITAMIN ADULTS PO) Take 1 tablet by mouth every morning.    Yes [provider]  potassium chloride SA (K-DUR,KLOR-CON) 20 MEQ tablet Take 20 mEq by mouth every morning.    Yes [provider]  sennosides-docusate sodium (SENOKOT-S) 8.6-50 MG tablet Take 1 tablet by mouth daily as needed for constipation.   Yes [provider]  ciprofloxacin (CIPRO) 500 MG tablet Take 1 tablet (500 mg total) by mouth 2 (two) times daily. Patient not taking: Reported on 06/13/2018 04/11/18   Myrlene Broker, MD    Family History Family History  Problem Relation Age of Onset  . Hypertension Other   . Diabetes Other     Social History Social  History   Tobacco Use  . Smoking status: Never Smoker  . Smokeless tobacco: Never Used  Substance Use Topics  . Alcohol use: Yes    Comment: rare  . Drug use: No     Allergies   Guaifenesin; Iron; Codeine; Doxepin; Hydromorphone hcl; Metoprolol succinate; Morphine; Pantoprazole sodium; Pseudoeph-doxylamine-dm-apap; Pseudoephedrine; Sulfa antibiotics; Prednisone; and Tetracyclines & related   Review of Systems Review of Systems  All other systems reviewed and are negative.    Physical Exam Updated Vital Signs BP (!) 140/109 (BP Location: Left Arm)   Pulse (!) 110   Temp 97.7 F (36.5 C) (Oral)   Resp 16   Wt 53 kg   LMP 05/22/2018   SpO2 99%   BMI 18.03 kg/m   Physical Exam Vitals signs and nursing note reviewed.  Constitutional:      General: She is not  in acute distress.    Appearance: She is well-developed.  HENT:     Head: Normocephalic and atraumatic.  Neck:     Musculoskeletal: Normal range of motion.  Cardiovascular:     Rate and Rhythm: Normal rate and regular rhythm.     Heart sounds: Normal heart sounds.  Pulmonary:     Effort: Pulmonary effort is normal.     Breath sounds: Normal breath sounds.  Abdominal:     General: There is no distension.     Palpations: Abdomen is soft.     Tenderness: There is no abdominal tenderness.  Musculoskeletal: Normal range of motion.  Skin:    General: Skin is warm and dry.  Neurological:     Mental Status: She is alert and oriented to person, place, and time.  Psychiatric:        Judgment: Judgment normal.      ED Treatments / Results  Labs (all labs ordered are listed, but only abnormal results are displayed) Labs Reviewed  CBC - Abnormal; Notable for the following components:      Result Value   WBC 26.8 (*)    All other components within normal limits  BASIC METABOLIC PANEL - Abnormal; Notable for the following components:   Potassium 3.4 (*)    All other components within normal limits  RAPID  URINE DRUG SCREEN, HOSP PERFORMED  I-STAT BETA HCG BLOOD, ED ([facility], [facility], [facility] ONLY)    EKG EKG Interpretation  Date/Time:  Wednesday June 13 2018 18:12:38 EST Ventricular Rate:  98 PR Interval:    QRS Duration: 81 QT Interval:  382 QTC Calculation: 488 R Axis:   70 Text Interpretation:  Sinus rhythm Consider left ventricular hypertrophy Borderline prolonged QT interval No significant change was found Confirmed by Azalia Bilisampos, Jeziel Hoffmann ([id number]) on 06/13/2018 7:34:14 PM   Radiology Dg Chest 2 View  Result Date: 06/13/2018 CLINICAL DATA:  Syncopal episode. Patient states she was out for 20 minutes and woke up with a pool of blood in her mouth. EXAM: CHEST - 2 VIEW COMPARISON:  06/29/2017 CXR FINDINGS: Patchy opacity at the right lung base suspicious for aspiration. Pulmonary hyperinflation with blunting the costophrenic angles is otherwise noted. No overt pulmonary edema. Heart and mediastinal contours are normal. No acute osseous abnormality is seen IMPRESSION: Patchy airspace opacity at the right lung base, suspicious for aspiration. Electronically Signed   By: Tollie Ethavid  Kwon M.D.   On: 06/13/2018 18:37    Procedures Procedures (including critical care time)  Medications Ordered in ED Medications - No data to display   Initial Impression / Assessment and Plan / ED Course  I have reviewed the triage vital signs and the nursing notes.  Pertinent labs & imaging results that were available during my care of the patient were reviewed by me and considered in my medical decision making (see chart for details).     IV abx for CAP. CT chest now to further evaluate. Admit to hospital on abx. No hemoptysis while in the ER. Admit to [facility].  Final Clinical Impressions(s) / ED Diagnoses   Final diagnoses:  Community acquired pneumonia of right lower lobe of lung [facility](HCC)  Hemoptysis    ED Discharge Orders    None       Azalia Bilisampos, Hanan Moen, MD 06/13/18 Babette Relic1960    Azalia Bilisampos, Jorja Empie,  MD 06/13/18 2033

## 2018-06-13 NOTE — ED Notes (Signed)
ED TO INPATIENT HANDOFF REPORT  Name/Age/Gender Carla FeltyBrandi D Gordon 47 y.o. female  Code Status    Code Status Orders  (From admission, onward)         Start     Ordered   06/13/18 2114  Full code  Continuous     06/13/18 2114        Code Status History    Date Active Date Inactive Code Status Order ID Comments User Context   12/26/2017 1121 12/28/2017 1430 Full Code [id number]  Axel Filleramirez, Armando, MD Inpatient   11/28/2017 0128 11/30/2017 1949 Full Code [id number]  Hillary BowGardner, Jared M, DO ED      Home/SNF/Other Home  Chief Complaint Coughing up Blood/Nausea   Level of Care/Admitting Diagnosis ED Disposition    ED Disposition Condition Comment   Admit  [facility] [[id number]]  Level of Care: Telemetry [5]  Admit to tele based on following criteria: Monitor for Ischemic changes  Diagnosis: CAP (community acquired pneumonia) [[id number]]) [340684]  Admitting Physician: Eduard ClosKAKRAKANDY, ARSHAD N [phone][[id number]]  Attending Physician: Eduard ClosKAKRAKANDY, ARSHAD N [phone][[id number]]  PT Class (Do Not Modify): Observation [104]  PT Acc Code (Do Not Modify): Observation [10022]       Medical History Past Medical History:  Diagnosis Date  . Allergic rhinitis   . Anxiety   . B12 deficiency   . Barrett's esophagus   . Chronic constipation   . 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 Depression   . GERD (gastroesophageal reflux disease)   . Hiatal hernia   . History of DVT of lower extremity    08-14-2003  RIGHT LOWER EXTREMITY-- TREATED W/ COUMADIN----  per pt none since  . History of palpitations   . Hypertension   . Idiopathic chronic gout    12-20-2017  per pt stable ,   last episode 02/ 2019  . Iron deficiency anemia   . Migraines   . Personal history of PE (pulmonary embolism)    03/ 2005 RIGHT SIDE  --- treated w/ coumadin---  per pt none since  . PONV (postoperative nausea and vomiting)   . Tinea versicolor   . Vitamin D deficiency      Allergies Allergies  Allergen Reactions  . Guaifenesin Shortness Of Breath  . Iron Anaphylaxis and Other (See Comments)    IV only  . Codeine Other (See Comments)    tiredness  . Doxepin Hives  . Hydromorphone Hcl Nausea And Vomiting  . Metoprolol Succinate Other (See Comments)    REACTION: edema  . Morphine Other (See Comments)    Does not work; hallucinations and irritable  . Pantoprazole Sodium Other (See Comments)    heartburn  . Pseudoeph-Doxylamine-Dm-Apap Hives  . Pseudoephedrine Hives  . Sulfa Antibiotics Hives  . Prednisone Rash  . Tetracyclines & Related Rash    IV Location/Drains/Wounds Patient Lines/Drains/Airways Status   Active Line/Drains/Airways    Name:   Placement date:   Placement time:   Site:   Days:   Peripheral IV 06/13/18 Right Antecubital   06/13/18    1849    Antecubital   less than 1   Incision (Closed) 12/26/17 Abdomen Other (Comment)   12/26/17    1005     169   Incision - 6 Ports Abdomen Right;Lateral Right;Medial Mid;Upper Left Left;Lateral Left   12/26/17    0815     169          Labs/Imaging  Results for orders placed or performed during the hospital encounter of 06/13/18 (from the past 48 hour(s))  CBC     Status: Abnormal   Collection Time: 06/13/18  6:04 PM  Result Value Ref Range   WBC 26.8 (H) 4.0 - 10.5 K/uL   RBC 4.90 3.87 - 5.11 MIL/uL   Hemoglobin 14.5 12.0 - 15.0 g/dL   HCT 04.544.0 40.936.0 - 81.146.0 %   MCV 89.8 80.0 - 100.0 fL   MCH 29.6 26.0 - 34.0 pg   MCHC 33.0 30.0 - 36.0 g/dL   RDW 91.413.3 78.211.5 - 95.615.5 %   Platelets 338 150 - 400 K/uL   nRBC 0.0 0.0 - 0.2 %    Comment: Performed at [facility], [address] [place], [place] [address]  Basic metabolic panel     Status: Abnormal   Collection Time: 06/13/18  6:04 PM  Result Value Ref Range   Sodium 142 135 - 145 mmol/L   Potassium 3.4 (L) 3.5 - 5.1 mmol/L   Chloride 105 98 - 111 mmol/L   CO2 22 22 - 32 mmol/L   Glucose, Bld 96 70 - 99 mg/dL   BUN  19 6 - 20 mg/dL   Creatinine, Ser 6.570.81 0.44 - 1.00 mg/dL   Calcium 9.5 8.9 - 84.610.3 mg/dL   GFR calc non Af Amer >60 >60 mL/min   GFR calc Af Amer >60 >60 mL/min   Anion gap 15 5 - 15    Comment: Performed at [facility], [address] [place], [place] [address]  Rapid urine drug screen (hospital performed)     Status: None   Collection Time: 06/13/18  6:15 PM  Result Value Ref Range   Opiates NONE DETECTED NONE DETECTED   Cocaine NONE DETECTED NONE DETECTED   Benzodiazepines NONE DETECTED NONE DETECTED   Amphetamines NONE DETECTED NONE DETECTED   Tetrahydrocannabinol NONE DETECTED NONE DETECTED   Barbiturates NONE DETECTED NONE DETECTED    Comment: (NOTE) DRUG SCREEN FOR MEDICAL PURPOSES ONLY.  IF CONFIRMATION IS NEEDED FOR ANY PURPOSE, NOTIFY LAB WITHIN 5 DAYS. LOWEST DETECTABLE LIMITS FOR URINE DRUG SCREEN Drug Class                     Cutoff (ng/mL) Amphetamine and metabolites    1000 Barbiturate and metabolites    200 Benzodiazepine                 200 Tricyclics and metabolites     300 Opiates and metabolites        300 Cocaine and metabolites        300 THC                            50 Performed at [facility], [address] [place], [place] [address]   I-Stat Beta hCG blood, ED ([facility], [facility], [facility] only)     Status: None   Collection Time: 06/13/18  6:44 PM  Result Value Ref Range   I-stat hCG, quantitative <5.0 <5 mIU/mL   Comment 3            Comment:   GEST. AGE      CONC.  (mIU/mL)   <=1 WEEK        5 - 50     2 WEEKS       50 - 500     3 WEEKS  100 - 10,000     4 WEEKS     1,000 - 30,000        FEMALE AND NON-PREGNANT FEMALE:     LESS THAN 5 mIU/mL    Dg Chest 2 View  Result Date: 06/13/2018 CLINICAL DATA:  Syncopal episode. Patient states she was out for 20 minutes and woke up with a pool of blood in her mouth. EXAM: CHEST - 2 VIEW COMPARISON:  06/29/2017 CXR FINDINGS: Patchy opacity at the right lung base  suspicious for aspiration. Pulmonary hyperinflation with blunting the costophrenic angles is otherwise noted. No overt pulmonary edema. Heart and mediastinal contours are normal. No acute osseous abnormality is seen IMPRESSION: Patchy airspace opacity at the right lung base, suspicious for aspiration. Electronically Signed   By: Tollie Eth M.D.   On: 06/13/2018 18:37   EKG Interpretation  Date/Time:  Wednesday June 13 2018 18:12:38 EST Ventricular Rate:  98 PR Interval:    QRS Duration: 81 QT Interval:  382 QTC Calculation: 488 R Axis:   70 Text Interpretation:  Sinus rhythm Consider left ventricular hypertrophy Borderline prolonged QT interval No significant change was found Confirmed by Azalia Bilis ([id number]) on 06/13/2018 7:34:14 PM   Pending Labs Unresulted Labs (From admission, onward)    Start     Ordered   06/14/18 0500  Basic metabolic panel  Tomorrow morning,   R     06/13/18 2114   06/14/18 0500  CBC  Tomorrow morning,   R     06/13/18 2114   06/13/18 2113  HIV antibody (Routine Screening)  Once,   R     06/13/18 2114   06/13/18 2113  Culture, blood (routine x 2) Call MD if unable to obtain prior to antibiotics being given  BLOOD CULTURE X 2,   R    Comments:  If blood cultures drawn in Emergency Department - Do not draw and cancel order    06/13/18 2114   06/13/18 2113  Culture, sputum-assessment  Once,   R     06/13/18 2114   06/13/18 2113  Gram stain  Once,   R     06/13/18 2114   06/13/18 2113  Strep pneumoniae urinary antigen  Once,   R     06/13/18 2114   06/13/18 2113  Legionella Pneumophila Serogp 1 Ur Ag  Once,   R     06/13/18 2114          Vitals/Pain Today's Vitals   06/13/18 1631 06/13/18 1645 06/13/18 1947 06/13/18 2100  BP:   130/88 (!) 132/93  Pulse:   97 (!) 112  Resp:   16 17  Temp:      TempSrc:      SpO2:   100% 99%  Weight: 53 kg     PainSc: 0-No pain 0-No pain      Isolation Precautions No active  isolations  Medications Medications  ondansetron (ZOFRAN) injection 4 mg (4 mg Intravenous Given 06/13/18 2121)  sodium chloride (PF) 0.9 % injection (has no administration in time range)  iopamidol (ISOVUE-370) 76 % injection (has no administration in time range)  hydrochlorothiazide (HYDRODIURIL) tablet 25 mg (has no administration in time range)  diazepam (VALIUM) tablet 5 mg (has no administration in time range)  sennosides-docusate sodium (SENOKOT-S) 8.6-50 MG tablet 1 tablet (has no administration in time range)  ferrous gluconate (FERGON) tablet 240 mg (has no administration in time range)  potassium chloride SA (K-DUR,KLOR-CON) CR tablet 20 mEq (has  no administration in time range)  cefTRIAXone (ROCEPHIN) 1 g in sodium chloride 0.9 % 100 mL IVPB (has no administration in time range)  azithromycin (ZITHROMAX) 500 mg in sodium chloride 0.9 % 250 mL IVPB (has no administration in time range)  cefTRIAXone (ROCEPHIN) 1 g in sodium chloride 0.9 % 100 mL IVPB ( Intravenous Stopped 06/13/18 2025)  azithromycin (ZITHROMAX) 500 mg in sodium chloride 0.9 % 250 mL IVPB (500 mg Intravenous New Bag/Given 06/13/18 2028)  iopamidol (ISOVUE-370) 76 % injection 100 mL (100 mLs Intravenous Contrast Given 06/13/18 2136)    Mobility Walks, normally

## 2018-06-13 NOTE — ED Notes (Signed)
ED Provider at bedside. 

## 2018-06-13 NOTE — H&P (Signed)
History and Physical    [name] ZOX:[id number] DOB: January 22, 1971 DOA: 06/13/2018  PCP: Tresa Garter, MD  Patient coming from: Home.  Chief Complaint: Coughing up blood.  HPI: [name] is a 47 y.o. female with history of congenital cystic disease of the lung has had required 2 previous surgery with cysts were removed first 1 was at age around 47 seconds about 10 years ago both of them were done at [facility], [place] (not able to trace records) who was admitted in June for vomiting and found to have large hiatal hernia and Barrett's esophagus eventually underwent nisin fundoplication in July 2019 presents to the ER after patient had a syncopal episode followed by coughing of blood.  Patient states that she has not been feeling well for last 2 weeks and this afternoon around 12 while being in her room she suddenly lost consciousness while sitting on the bed.  She was on the floor she did hit her head.  Her sister and brother-in-law were at the house and woke her up and she took some time to wake up at least 20 minutes.  Did not have any incontinence of urine or bowel.  Following which she started coughing up a lot of blood.  Did not have any fever chills.  Did not have any recent sick contacts.  Patient states whenever she gets lung infection which happens almost every year she gets the syncopal attacks.  Denies any abdominal pain nausea vomiting diarrhea chest pain.  ED Course: On exam patient has a right crepitations and CT angiogram of the chest was done given history of previous pulmonary embolism and hypercoagulable status which shows right lower lobe infiltrates concerning for aspiration with possible cyst measuring around 3 cm.  Patient was started on empiric antibiotics for community-acquired pneumonia.  EKG shows normal sinus rhythm with QTC of 488 ms.  Also shows features concerning for LVH.  Patient still has mild headache on the left frontal area which appears mildly  tender to touch.  WBC count is 26,000.  Review of Systems: As per HPI, rest all negative.   Past Medical History:  Diagnosis Date  . Allergic rhinitis   . Anxiety   . B12 deficiency   . Barrett's esophagus   . Chronic constipation   . 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 Depression   . GERD (gastroesophageal reflux disease)   . Hiatal hernia   . History of DVT of lower extremity    08-14-2003  RIGHT LOWER EXTREMITY-- TREATED W/ COUMADIN----  per pt none since  . History of palpitations   . Hypertension   . Idiopathic chronic gout    12-20-2017  per pt stable ,   last episode 02/ 2019  . Iron deficiency anemia   . Migraines   . Personal history of PE (pulmonary embolism)    03/ 2005 RIGHT SIDE  --- treated w/ coumadin---  per pt none since  . PONV (postoperative nausea and vomiting)   . Tinea versicolor   . Vitamin D deficiency     Past Surgical History:  Procedure Laterality Date  . BIOPSY  11/29/2017   Procedure: BIOPSY;  Surgeon: [name], MD;  Location: [facility];  Service: Endoscopy;;  . BRONCHOSCOPY  05-20-2004  dr clance  . ESOPHAGOGASTRODUODENOSCOPY (EGD) WITH PROPOFOL N/A 11/29/2017   Procedure: ESOPHAGOGASTRODUODENOSCOPY (EGD) WITH PROPOFOL;  Surgeon: [name], MD;  Location: [facility];  Service: Endoscopy;  Laterality: N/A;  . EXPLORATORY LAPAROTOMY  AGE 42    [facility]   for constipation  . INSERTION OF MESH N/A 12/26/2017   Procedure: INSERTION OF BIO A MESH;  Surgeon: Axel Filleramirez, Armando, MD;  Location: [facility] ORS;  Service: General;  Laterality: N/A;  . KNEE ARTHROSCOPY W/ ACL RECONSTRUCTION Right 09/2003  . LAPAROSCOPY BILATERAL TUBAL FULGERATION AND ATTEMPTED THERMACHOICE ABLATION WITH UTERINE PERFORATION  07-17-2009   [facility]er  [facility]  . LUNG LOBECTOMY  1991   right lower lobe for massive hemoptyosis  . MINI-THORACOTOMY WEDGE RESECTION OF LEFT LOWER LOBE CYST Left 06-24-2004   [facility]   hemoptyosis      reports that she has never smoked. She has never used smokeless tobacco. She reports current alcohol use. She reports that she does not use drugs.  Allergies  Allergen Reactions  . Guaifenesin Shortness Of Breath  . Iron Anaphylaxis and Other (See Comments)    IV only  . Codeine Other (See Comments)    tiredness  . Doxepin Hives  . Hydromorphone Hcl Nausea And Vomiting  . Metoprolol Succinate Other (See Comments)    REACTION: edema  . Morphine Other (See Comments)    Does not work; hallucinations and irritable  . Pantoprazole Sodium Other (See Comments)    heartburn  . Pseudoeph-Doxylamine-Dm-Apap Hives  . Pseudoephedrine Hives  . Sulfa Antibiotics Hives  . Prednisone Rash  . Tetracyclines & Related Rash    Family History  Problem Relation Age of Onset  . Hypertension Other   . Diabetes Other     Prior to Admission medications   Medication Sig Start Date End Date Taking? Authorizing Provider  allopurinol (ZYLOPRIM) 100 MG tablet Take 1 tablet (100 mg total) by mouth daily. Must keep visit on 11/03/17 to get refills Patient taking differently: Take 100 mg by mouth daily as needed (for gout flare up). Must keep visit on 11/03/17 to get refills 11/03/17  Yes Plotnikov, Georgina QuintAleksei V, MD  aspirin-acetaminophen-caffeine (EXCEDRIN MIGRAINE) [id number] MG tablet Take 1 tablet by mouth every 6 (six) hours as needed for headache or migraine.    Yes [provider]  Cholecalciferol (VITAMIN D3) 2000 units TABS Take 2,000 Units by mouth every morning.    Yes [provider]  diazepam (VALIUM) 5 MG tablet TAKE 1 TABLET BY MOUTH EVERY 12 HOURS IF NEEDED FOR ANXIETY Patient taking differently: Take 5 mg by mouth every 12 (twelve) hours as needed for anxiety. take 1 tablet by mouth every 12 hours if needed for anxiety 05/16/18  Yes Plotnikov, Georgina QuintAleksei V, MD  ferrous gluconate (IRON 27) 240 (27 FE) MG tablet Take 240 mg by mouth every morning.    Yes [provider]    hydrochlorothiazide (HYDRODIURIL) 25 MG tablet Take 25 mg by mouth every morning.    Yes [provider]  Multiple Vitamins-Minerals (MULTIVITAMIN ADULTS PO) Take 1 tablet by mouth every morning.    Yes [provider]  potassium chloride SA (K-DUR,KLOR-CON) 20 MEQ tablet Take 20 mEq by mouth every morning.    Yes [provider]  sennosides-docusate sodium (SENOKOT-S) 8.6-50 MG tablet Take 1 tablet by mouth daily as needed for constipation.   Yes [provider]  ciprofloxacin (CIPRO) 500 MG tablet Take 1 tablet (500 mg total) by mouth 2 (two) times daily. Patient not taking: Reported on 06/13/2018 04/11/18   Myrlene Brokerrawford, Elizabeth A, MD    Physical Exam: Vitals:   06/13/18 1630 06/13/18 1631 06/13/18  1947  BP: (!) 140/109  130/88  Pulse: (!) 110  97  Resp: 16  16  Temp: 97.7 F (36.5 C)    TempSrc: Oral    SpO2: 99%  100%  Weight:  53 kg       Constitutional: Moderately built and nourished. Vitals:   06/13/18 1630 06/13/18 1631 06/13/18 1947  BP: (!) 140/109  130/88  Pulse: (!) 110  97  Resp: 16  16  Temp: 97.7 F (36.5 C)    TempSrc: Oral    SpO2: 99%  100%  Weight:  53 kg    Eyes: Anicteric no pallor. ENMT: No discharge from the ears eyes nose or mouth. Neck: No mass felt.  No neck rigidity. Respiratory: No rhonchi or crepitations. Cardiovascular: S1-S2 heard. Abdomen: Soft nontender bowel sounds present. Musculoskeletal: No edema.  No joint effusion. Skin: No rash. Neurologic: Alert awake oriented to time place and person.  Moves all extremities. Psychiatric: Appears normal.   Labs on Admission: I have personally reviewed following labs and imaging studies  CBC: Recent Labs  Lab 06/13/18 1804  WBC 26.8*  HGB 14.5  HCT 44.0  MCV 89.8  PLT 338   Basic Metabolic Panel: Recent Labs  Lab 06/13/18 1804  NA 142  K 3.4*  CL 105  CO2 22  GLUCOSE 96  BUN 19  CREATININE 0.81  CALCIUM 9.5   GFR: Estimated Creatinine  Clearance: 71.1 mL/min (by C-G formula based on SCr of 0.81 mg/dL). Liver Function Tests: No results for input(s): AST, ALT, ALKPHOS, BILITOT, PROT, ALBUMIN in the last 168 hours. No results for input(s): LIPASE, AMYLASE in the last 168 hours. No results for input(s): AMMONIA in the last 168 hours. Coagulation Profile: No results for input(s): INR, PROTIME in the last 168 hours. Cardiac Enzymes: No results for input(s): CKTOTAL, CKMB, CKMBINDEX, TROPONINI in the last 168 hours. BNP (last 3 results) No results for input(s): PROBNP in the last 8760 hours. HbA1C: No results for input(s): HGBA1C in the last 72 hours. CBG: No results for input(s): GLUCAP in the last 168 hours. Lipid Profile: No results for input(s): CHOL, HDL, LDLCALC, TRIG, CHOLHDL, LDLDIRECT in the last 72 hours. Thyroid Function Tests: No results for input(s): TSH, T4TOTAL, FREET4, T3FREE, THYROIDAB in the last 72 hours. Anemia Panel: No results for input(s): VITAMINB12, FOLATE, FERRITIN, TIBC, IRON, RETICCTPCT in the last 72 hours. Urine analysis:    Component Value Date/Time   COLORURINE YELLOW 11/27/2017 2137   APPEARANCEUR CLEAR 11/27/2017 2137   LABSPEC 1.013 11/27/2017 2137   PHURINE 8.0 11/27/2017 2137   GLUCOSEU NEGATIVE 11/27/2017 2137   GLUCOSEU NEGATIVE 10/14/2016 1033   HGBUR MODERATE (A) 11/27/2017 2137   BILIRUBINUR NEGATIVE 11/27/2017 2137   KETONESUR 20 (A) 11/27/2017 2137   PROTEINUR 100 (A) 11/27/2017 2137   UROBILINOGEN 0.2 10/14/2016 1033   NITRITE NEGATIVE 11/27/2017 2137   LEUKOCYTESUR NEGATIVE 11/27/2017 2137   Sepsis Labs: @LABRCNTIP (procalcitonin:4,lacticidven:4) )No results found for this or any previous visit (from the past 240 hour(s)).   Radiological Exams on Admission: Dg Chest 2 View  Result Date: 06/13/2018 CLINICAL DATA:  Syncopal episode. Patient states she was out for 20 minutes and woke up with a pool of blood in her mouth. EXAM: CHEST - 2 VIEW COMPARISON:  06/29/2017  CXR FINDINGS: Patchy opacity at the right lung base suspicious for aspiration. Pulmonary hyperinflation with blunting the costophrenic angles is otherwise noted. No overt pulmonary edema. Heart and mediastinal contours are normal. No acute osseous  abnormality is seen IMPRESSION: Patchy airspace opacity at the right lung base, suspicious for aspiration. Electronically Signed   By: Tollie Eth M.D.   On: 06/13/2018 18:37    EKG: Independently reviewed.  Normal sinus rhythm with LVH and QTC of 488 ms.  Assessment/Plan Principal Problem:   CAP (community acquired pneumonia) Active Problems:   B12 deficiency   Asthma   Essential hypertension   Barrett's esophagus   S/P Nissen fundoplication (without gastrostomy tube) procedure   Hemoptysis    1. Pneumonia possibly aspiration versus community-acquired pneumonia and has been placed on ceftriaxone and Zithromax follow blood cultures sputum cultures urine for Legionella strep antigen.  Patient is afebrile but has significant leukocytosis. 2. Hemoptysis versus hematemesis -has not had any sick contacts or travel.  Has had previous episodes of hemoptysis when patient developed some lung infection.  Patient states she has had no contact with tuberculosis patients and was not exposed.  Patient also was previously checked for TB and was negative per the patient.  Follow CBC. 3. 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  4. Syncopal episode with no prodrome.  Has had previous episodes of syncope.  EKG shows borderline QTC and LVH changes and has not had any 2D echo previously for which I have ordered a 2D echo.  Monitor in telemetry.  Since patient had sudden syncope with some headache will check MRI brain. 5. Recently since fundoplication and CAT scan showed show some fluid in the esophagus.  Patient is allergic to Protonix I kept patient on PPI. 6. History of  Barrett's esophagus followed by gastroenterologist. 7. Hypertension on hydrochlorothiazide. 8. Previous history of PE not on any anticoagulation CT scan was negative at this time. 9. During June admission in 2019 patient had abnormal CT scan of the abdomen showing abnormalities in the pancreas.  Will need follow-up on that.   DVT prophylaxis: SCDs due to bleeding. Code Status: Full code. Family Communication: Patient's sister at the bedside. Disposition Plan: Home when stable. Consults called: Pulmonary consult for cystic disease and hemoptysis which required surgery previously. Admission status: Observation.   Eduard Clos MD [facility] Pager [phone].  If 7PM-7AM, please contact night-coverage [email or web address] Password [email or web address]  06/13/2018, 9:15 PM

## 2018-06-14 ENCOUNTER — Observation Stay (HOSPITAL_BASED_OUTPATIENT_CLINIC_OR_DEPARTMENT_OTHER): Payer: 59

## 2018-06-14 ENCOUNTER — Observation Stay (HOSPITAL_COMMUNITY): Payer: 59

## 2018-06-14 DIAGNOSIS — K22719 Barrett's esophagus with dysplasia, unspecified: Secondary | ICD-10-CM

## 2018-06-14 DIAGNOSIS — J690 Pneumonitis due to inhalation of food and vomit: Principal | ICD-10-CM

## 2018-06-14 DIAGNOSIS — E440 Moderate protein-calorie malnutrition: Secondary | ICD-10-CM

## 2018-06-14 DIAGNOSIS — R55 Syncope and collapse: Secondary | ICD-10-CM | POA: Diagnosis not present

## 2018-06-14 DIAGNOSIS — R778 Other specified abnormalities of plasma proteins: Secondary | ICD-10-CM | POA: Diagnosis present

## 2018-06-14 DIAGNOSIS — R7989 Other specified abnormal findings of blood chemistry: Secondary | ICD-10-CM | POA: Diagnosis not present

## 2018-06-14 DIAGNOSIS — R042 Hemoptysis: Secondary | ICD-10-CM

## 2018-06-14 DIAGNOSIS — R1310 Dysphagia, unspecified: Secondary | ICD-10-CM | POA: Diagnosis not present

## 2018-06-14 DIAGNOSIS — J181 Lobar pneumonia, unspecified organism: Secondary | ICD-10-CM | POA: Diagnosis not present

## 2018-06-14 DIAGNOSIS — I10 Essential (primary) hypertension: Secondary | ICD-10-CM | POA: Diagnosis not present

## 2018-06-14 DIAGNOSIS — J439 Emphysema, unspecified: Secondary | ICD-10-CM | POA: Diagnosis not present

## 2018-06-14 DIAGNOSIS — Z9889 Other specified postprocedural states: Secondary | ICD-10-CM

## 2018-06-14 LAB — ECHOCARDIOGRAM COMPLETE
Height: 67.5 in
Weight: 1764.8 oz

## 2018-06-14 LAB — STREP PNEUMONIAE URINARY ANTIGEN: Strep Pneumo Urinary Antigen: NEGATIVE

## 2018-06-14 LAB — BASIC METABOLIC PANEL
Anion gap: 9 (ref 5–15)
BUN: 17 mg/dL (ref 6–20)
CO2: 22 mmol/L (ref 22–32)
Calcium: 8.3 mg/dL — ABNORMAL LOW (ref 8.9–10.3)
Chloride: 110 mmol/L (ref 98–111)
Creatinine, Ser: 0.73 mg/dL (ref 0.44–1.00)
GFR calc Af Amer: >60 mL/min (ref >60)
GFR calc non Af Amer: >60 mL/min (ref >60)
Glucose, Bld: 111 mg/dL — ABNORMAL HIGH (ref 70–99)
Potassium: 3.2 mmol/L — ABNORMAL LOW (ref 3.5–5.1)
SODIUM: 141 mmol/L (ref 135–145)

## 2018-06-14 LAB — TYPE AND SCREEN
ABO/RH(D): O POS
Antibody Screen: NEGATIVE

## 2018-06-14 LAB — CBC
HCT: 35.1 % — ABNORMAL LOW (ref 36.0–46.0)
Hemoglobin: 11.3 g/dL — ABNORMAL LOW (ref 12.0–15.0)
MCH: 29.2 pg (ref 26.0–34.0)
MCHC: 32.2 g/dL (ref 30.0–36.0)
MCV: 90.7 fL (ref 80.0–100.0)
Platelets: 288 10*3/uL (ref 150–400)
RBC: 3.87 MIL/uL (ref 3.87–5.11)
RDW: 13.7 % (ref 11.5–15.5)
WBC: 21.4 10*3/uL — ABNORMAL HIGH (ref 4.0–10.5)
nRBC: 0 % (ref 0.0–0.2)

## 2018-06-14 LAB — HIV ANTIBODY (ROUTINE TESTING W REFLEX): HIV Screen 4th Generation wRfx: NONREACTIVE

## 2018-06-14 LAB — TROPONIN I
Troponin I: 0.2 ng/mL (ref <0.03)
Troponin I: 0.12 ng/mL (ref <0.03)
Troponin I: 0.08 ng/mL (ref <0.03)

## 2018-06-14 MED ORDER — IBUPROFEN 200 MG PO TABS: 400.0000 mg | ORAL_TABLET | Freq: Four times a day (QID) | ORAL | Status: DC | PRN

## 2018-06-14 MED ORDER — POTASSIUM CHLORIDE CRYS ER 20 MEQ PO TBCR
20.0000 meq | EXTENDED_RELEASE_TABLET | Freq: Three times a day (TID) | ORAL | Status: DC
  Administered 2018-06-14 – 2018-06-16 (×7): 20 meq via ORAL
  Filled 2018-06-14 (×7): qty 1

## 2018-06-14 MED ORDER — ACETAMINOPHEN 325 MG PO TABS
650.0000 mg | ORAL_TABLET | ORAL | Status: DC | PRN
  Administered 2018-06-14 – 2018-06-15 (×3): 650 mg via ORAL
  Filled 2018-06-14 (×3): qty 2

## 2018-06-14 MED ORDER — FAMOTIDINE IN NACL 20-0.9 MG/50ML-% IV SOLN
20.0000 mg | Freq: Two times a day (BID) | INTRAVENOUS | Status: DC
  Filled 2018-06-14: qty 50

## 2018-06-14 MED ORDER — FAMOTIDINE 20 MG PO TABS
20.0000 mg | ORAL_TABLET | Freq: Two times a day (BID) | ORAL | Status: DC
  Administered 2018-06-14: 20 mg via ORAL
  Filled 2018-06-14 (×5): qty 1

## 2018-06-14 MED ORDER — PRO-STAT SUGAR FREE PO LIQD
30.0000 mL | Freq: Two times a day (BID) | ORAL | Status: DC
  Administered 2018-06-14 – 2018-06-15 (×3): 30 mL via ORAL
  Filled 2018-06-14 (×5): qty 30

## 2018-06-14 MED ORDER — BOOST / RESOURCE BREEZE PO LIQD CUSTOM
1.0000 | ORAL | Status: DC
  Administered 2018-06-14 – 2018-06-15 (×2): 1 via ORAL

## 2018-06-14 NOTE — Progress Notes (Signed)
Initial Nutrition Assessment  DOCUMENTATION CODES:   Underweight, Non-severe (moderate) malnutrition in context of chronic illness  INTERVENTION:    30 ml Prostat BID, each supplement provides 100 kcals and 15 grams protein.   Boost Breeze po once daily, each supplement provides 250 kcal and 9 grams of protein  NUTRITION DIAGNOSIS:   Moderate Malnutrition related to chronic illness(s/p Nissen ) as evidenced by mild muscle depletion, moderate muscle depletion, percent weight loss.  GOAL:   Patient will meet greater than or equal to 90% of their needs  MONITOR:   PO intake, Supplement acceptance, Weight trends, Labs, I & O's  REASON FOR ASSESSMENT:   Malnutrition Screening Tool    ASSESSMENT:   Patient with PMH significant for Barrett's esophagus, HTN, PE, and hiatal hernia s/p Nissen fundoplication 12/2017. Presents this admission with CAP associated with hemoptysis and syncope.    Pt endorses prior to her Nissen in July she weighed 127 lb and after 6 weeks on a liquid diet her weight decreased to 116 lb. During this time she attempted to Ensure but could not tolerate the taste. Once she began adding regular food she maintained her weight until two months ago when she started having diarrhea that lasted for 3-4 weeks. Her weight decreased even further to 105 lb. Over the past month, her intake has progressed slightly. She eats two meals daily that consist of steak, chicken, vegetables, seafood, and sweet potatoes with butter. She isn't much of a breakfast person. Discussed the importance of protein intake for preservation of lean body mass. Went over supplement options pt has while admitted. Pt would like to try Prostat as a friend suggested this to her. RD recommends high calorie high protein supplements to promote weight gain.   Over all weight gain equates to 12.7% wt loss in 5 months. Significant for time frame. Nutrition-Focused physical exam completed.   Medications reviewed  and include: ferrous gluconate, 20 mEq KCl TID Labs reviewed: K 3.2 (L)   NUTRITION - FOCUSED PHYSICAL EXAM:    Most Recent Value  Orbital Region  No depletion  Upper Arm Region  Mild depletion  Thoracic and Lumbar Region  Unable to assess  Buccal Region  No depletion  Temple Region  Mild depletion  Clavicle Bone Region  Mild depletion  Clavicle and Acromion Bone Region  Mild depletion  Scapular Bone Region  Unable to assess  Dorsal Hand  No depletion  Patellar Region  Moderate depletion  Anterior Thigh Region  Moderate depletion  Posterior Calf Region  Moderate depletion  Edema (RD Assessment)  None  Hair  Reviewed  Eyes  Reviewed  Mouth  Reviewed  Skin  Reviewed  Nails  Reviewed     Diet Order:   Diet Order            Diet regular Room service appropriate? Yes; Fluid consistency: Thin  Diet effective now              EDUCATION NEEDS:   Education needs have been addressed  Skin:  Skin Assessment: Reviewed RN Assessment  Last BM:  06/09/18  Height:   Ht Readings from Last 1 Encounters:  06/13/18 5' 7.5" (1.715 m)    Weight:   Wt Readings from Last 1 Encounters:  06/13/18 50 kg    Ideal Body Weight:  63.6 kg  BMI:  Body mass index is 17.02 kg/m.  Estimated Nutritional Needs:   Kcal:  1500-1700 kcal  Protein:  75-90 grams  Fluid:  >/=  1.5 L/day   Mariana Single RD, LDN Clinical Nutrition Pager # [phone]

## 2018-06-14 NOTE — Progress Notes (Signed)
  Echocardiogram 2D Echocardiogram has been performed.  Leta JunglingCooper, Zaquan Duffner M 06/14/2018, 8:46 AM

## 2018-06-14 NOTE — Progress Notes (Addendum)
Progress Note    [name]  JYN:[id number] DOB: 06/22/1970  DOA: 06/13/2018 PCP: Tresa GarterPlotnikov, Carla V, MD    Brief Narrative:   Chief complaint: Follow-up hemoptysis  Medical records reviewed and are as summarized below:  [name] is an 47 y.o. 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  In the ED, noted to be hypertensive with a blood pressure of 140/109, pulse 110, but was otherwise afebrile.  A CT angiogram showed right lower lobe infiltrates and a 3 cm cyst.  WBC was 26,000.  Assessment/Plan:   Principal Problem:   CAP (community acquired pneumonia) associated with hemoptysis and syncope CT scan personally reviewed and shows a significant right-sided infiltrate around a large cystic area as pictured below.  Placed on empiric Rocephin and azithromycin.  Follow-up blood cultures, sputum culture, strep pneumonia and Legionella antigen testing.  Pulmonary critical care consulted by admitting MD, and they recommend outpatient follow-up with pulmonology and further diagnostic work-up for her cystic lung disease.  Remains tachycardic but continues to be afebrile.  WBC remains markedly elevated at 21.4.  Has had a 3 g drop in her hemoglobin, but suspect this is partially dilutional.  Given the severity of her symptoms and the high risk for decompensation, will move her to inpatient as she is unlikely to be discharged in under 48 hours.  Active Problems:   Troponin elevation likely from demand ischemia Troponin 0 0.44.  Likely demand ischemia in the setting of tachycardia, syncopal event and acute infection.  We will cycle troponins and keep on telemetry.  No aspirin given her significant history of hemoptysis.  Follow-up 2D echocardiogram.   Dysphasia Speech therapy evaluation requested.    Headache with possible fibromuscular dysplasia noted on MRI Given syncopal event, admitting MD ordered an MRI of the brain.  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 Suspect underlying fibromuscular dysplasia. Will need an outpatient work up.    Hypokalemia Will increase potassium supplementation given persistent hypokalemia.    Essential hypertension Continue hydrochlorothiazide.    Barrett's esophagus S/P Nissen fundoplication (without gastrostomy tube) procedure Continue Pepcid.   Family Communication/Anticipated D/C date and plan/Code Status   DVT prophylaxis: SCDs ordered. Code Status: Full Code.  Family Communication: Sister at the bedside. Disposition Plan: Home in 48 hours, depending on clinical improvement and pulmonology recommendations.   Medical Consultants:    Pulmonology   Anti-Infectives:    Rocephin 06/13/2018--->  Azithromycin 06/13/2018--->  Subjective:   Feels very weak.  Showed me a photo of the amount of blood on the carpet she found after she passed out yesterday.  Thinks she was out for 30 minutes, and reported left shoulder pain and a sore area on her left forehead afterwards.  Requesting pain medicine for left shoulder pain but reports her headache is only mild at present. No SOB right now. Anxious.   Objective:    Vitals:   06/13/18 2100 06/13/18 2204 06/13/18 2207 06/14/18 0515  BP: (!) 132/93 (!) 132/93  128/90  Pulse: (!) 112 (!) 105  (!) 105  Resp: 17 18  18   Temp:  97.6 F (36.4 C)  97.8 F (36.6 C)  TempSrc:  Oral  Oral  SpO2: 99% 96%  94%  Weight:   50 kg   Height:   5' 7.5" (1.715 m)     Intake/Output Summary (Last 24 hours) at 06/14/2018 0712 Last data filed at 06/14/2018 0600 Gross per 24 hour  Intake 1310.3 ml  Output  450 ml  Net 860.3 ml   Filed Weights   06/13/18 1631 06/13/18 2207  Weight: 53 kg 50 kg    Exam: General: No acute distress. Cardiovascular: Heart sounds show a regular rate, and rhythm. No gallops or rubs. No murmurs. No JVD. Lungs: Left side clear, right with rhonchi.  Abdomen: Soft, nontender, nondistended with normal active bowel sounds. No masses. No hepatosplenomegaly. Neurological: Alert and oriented 3. Moves all extremities 4 with equal strength. Cranial nerves II through XII grossly intact. Skin: Warm and dry. No rashes or lesions. Extremities: No clubbing or cyanosis. No edema. Pedal pulses 2+. Psychiatric: Mood and affect are mildly anxious. Insight and judgment are normal.   Data Reviewed:   I have personally reviewed following labs and imaging studies:  Labs: Labs show the following:   Basic Metabolic Panel: Recent Labs  Lab 06/13/18 1804 06/14/18 0345  NA 142 141  K 3.4* 3.2*  CL 105 110  CO2 22 22  GLUCOSE 96 111*  BUN 19 17  CREATININE 0.81 0.73  CALCIUM 9.5 8.3*   GFR Estimated Creatinine Clearance: 67.9 mL/min (by C-G formula based on SCr of 0.73 mg/dL).  CBC: Recent Labs  Lab 06/13/18 1804 06/13/18 2256 06/14/18 0345  WBC 26.8* 28.8* 21.4*  HGB 14.5 12.9 11.3*  HCT 44.0 39.4 35.1*  MCV 89.8 90.6 90.7  PLT 338 323 288   Cardiac Enzymes: Recent Labs  Lab 06/13/18 2256  TROPONINI 0.44*   Sepsis Labs: Recent Labs  Lab 06/13/18 1804 06/13/18 2256 06/14/18 0345  WBC 26.8* 28.8* 21.4*    Microbiology No results found for this or any previous visit (from the past 240 hour(s)).  Procedures and diagnostic studies:  Dg Chest 2 View  Result Date: 06/13/2018 CLINICAL DATA:  Syncopal episode. Patient states she was out for 20 minutes and woke up with a pool of blood in her mouth. EXAM: CHEST - 2 VIEW COMPARISON:  06/29/2017 CXR FINDINGS: Patchy opacity at the right lung base suspicious for aspiration. Pulmonary hyperinflation with  blunting the costophrenic angles is otherwise noted. No overt pulmonary edema. Heart and mediastinal contours are normal. No acute osseous abnormality is seen IMPRESSION: Patchy airspace opacity at the right lung base, suspicious for aspiration. Electronically Signed   By: Tollie Eth M.D.   On: 06/13/2018 18:37   Ct Angio Chest Pe W And/or Wo Contrast  Result Date: 06/13/2018 CLINICAL DATA:  Syncopal episode 1230 today. EXAM: CT ANGIOGRAPHY CHEST WITH CONTRAST TECHNIQUE: Multidetector CT imaging of the chest was performed using the standard protocol during bolus administration of intravenous contrast. Multiplanar CT image reconstructions and MIPs were obtained to evaluate the vascular anatomy. CONTRAST:  ISOVUE-370 IOPAMIDOL (ISOVUE-370) INJECTION 76% COMPARISON:  None. FINDINGS: Cardiovascular: Normal heart size without pericardial effusion or thickening. No significant coronary arteriosclerosis. Satisfactory opacification of pulmonary arteries without embolus. Nonaneurysmal thoracic aorta dissection. Conventional branch pattern of the great vessels. Mediastinum/Nodes: No enlarged mediastinal, hilar, or axillary lymph nodes. The thyroid gland is normal in size without dominant mass. Trachea is patent. Dilated debris-filled esophagus associated with a moderate-sized hiatal hernia. Lungs/Pleura: Patchy airspace opacities in the right lower lobe outlining what appears be a thin walled  pulmonary cyst measures approximately 3.2 x 2 x 0.7 cm. No effusion or pneumothorax. Mild centrilobular emphysema in the upper lobes. Upper Abdomen: No acute abnormality. Musculoskeletal: No chest wall abnormality. No acute or significant osseous findings. Review of the MIP images confirms the above findings. IMPRESSION: 1. No acute pulmonary embolus. 2. Patchy airspace opacities in the right lower lobe outlining what appears to be a thin walled pulmonary cyst measuring 3.2 x 2 x 0.7 cm. Findings raise concern for pneumonia  possibly aspiration related. 3. Moderate-sized hiatal hernia dilated air-filled esophagus containing frothy material within. Emphysema (ICD10-J43.9). Electronically Signed   By: Tollie Ethavid  Kwon M.D.   On: 06/13/2018 21:55    Medications:   . ferrous gluconate  324 mg Oral q morning - 10a  . hydrochlorothiazide  25 mg Oral q morning - 10a  . iopamidol      . potassium chloride SA  20 mEq Oral q morning - 10a  . sodium chloride (PF)       Continuous Infusions: . azithromycin    . cefTRIAXone (ROCEPHIN)  IV    . famotidine (PEPCID) IV       LOS: 0 days   Hillery AldoChristina   [facility] Pager [phone]. If unable to reach me by pager, please call my cell phone at [phone].  *Please refer to [email or web address], password [email or web address] to get updated schedule on who will round on this patient, as hospitalists switch teams weekly. If 7PM-7AM, please contact night-coverage at [email or web address], password [email or web address] for any overnight needs.  06/14/2018, 7:12 AM

## 2018-06-14 NOTE — Consult Note (Signed)
NAME:  [name], MRN:  [id number], DOB:  08-20-1970, LOS: 0 ADMISSION DATE:  06/13/2018, CONSULTATION DATE:  06/14/2018 REFERRING MD:  Thomasena Edis, CHIEF COMPLAINT:  Hemoptysis and pulmonary cyst   Brief History   47 year old female with PMH of pulmonary cyst removed x2, hemoptysis that occurs every time she has an infection and also suffers a syncopal episode when infected.  She presents again with a right sided aspiration PNA, syncopal episode and hemoptysis.  Hemoptysis has improved since admission and abx.  No fever or chills.  A pulmonary cyst was noted on CT as well that I reviewed myself that measures 3 cm and infiltrate in the RLL with a severely dilated esophagus.  History of present illness   47 year old female with PMH of pulmonary cyst removed x2, hemoptysis that occurs every time she has an infection and also suffers a syncopal episode when infected.  She presents again with a right sided aspiration PNA, syncopal episode and hemoptysis.  Hemoptysis has improved since admission and abx.  No fever or chills.  A pulmonary cyst was noted on CT as well that I reviewed myself that measures 3 cm and infiltrate in the RLL with a severely dilated esophagus.  Past Medical History  Pulmonary cysts Aspiration PNA Hemoptysis  Significant Hospital Events   Hemoptysis  Consults:  PCCM  Procedures:  CT of the chest that I reviewed myself with RLL infiltrate and a RLL cyst  Significant Diagnostic Tests:  CT of the chest that I reviewed myself with RLL infiltrate and a RLL cyst  Micro Data:  Blood 12/26>>> Sputum 12/26>>>  Antimicrobials:  Rocephin 12/26>>> Zithromax 12/26>>>   Interim history/subjective:  Hemoptysis resolved since being in the hospital  Objective   Blood pressure 128/90, pulse (!) 105, temperature 97.8 F (36.6 C), temperature source Oral, resp. rate 18, height 5' 7.5" (1.715 m), weight 50 kg, last menstrual period 05/22/2018, SpO2 94 %.         Intake/Output Summary (Last 24 hours) at 06/14/2018 1028 Last data filed at 06/14/2018 0600 Gross per 24 hour  Intake 1310.3 ml  Output 450 ml  Net 860.3 ml   Filed Weights   06/13/18 1631 06/13/18 2207  Weight: 53 kg 50 kg    Examination: General: Chronically ill appearing female, older than stated age, NAD HENT: [place]/AT, PERRL, EOM-I and MMM Lungs: RLL fine crackles Cardiovascular: RRR, Nl S1/S2 and -M/R/G Abdomen: Soft, NT, ND and +BS Extremities: -edema and -tenderness Neuro: Alert and interactive, moving all ext to command Skin: Intact  Resolved Hospital Problem list   N/A  Assessment & Plan:  47 year old female with PMH of previous pulmonary cysts that were removed as a child of unknown cause presenting with aspiration pneumonia, hemoptysis and a pulmonary cyst.  Discussed with PCCM-NP.  Hemoptysis: evidently occurs whenever the patient has an infection  - No bronch  - Monitor clinically  - Avoid anti-coagulation for now  Aspiration PNA:  - Rocephin  - Zithromax  - F/U on cultures  Dysphagia:  - SLP  Pulmonary cysts: congential, DDx of lymphangioleiomyomatosis (LAM), pulmonary Langerhans cell histiocytosis (PLCH), Birt-Hogg-Dub syndrome (BHD).  I do not see any diagnostic work up but realistically this is an outpatient work up.  - No need for cyst removal given size and lack of mass effect  - F/U with PCCM as outpatient  PCCM will sign off, please call back if needed.  Labs   CBC: Recent Labs  Lab  06/13/18 1804 06/13/18 2256 06/14/18 0345  WBC 26.8* 28.8* 21.4*  HGB 14.5 12.9 11.3*  HCT 44.0 39.4 35.1*  MCV 89.8 90.6 90.7  PLT 338 323 288    Basic Metabolic Panel: Recent Labs  Lab 06/13/18 1804 06/14/18 0345  NA 142 141  K 3.4* 3.2*  CL 105 110  CO2 22 22  GLUCOSE 96 111*  BUN 19 17  CREATININE 0.81 0.73  CALCIUM 9.5 8.3*   GFR: Estimated Creatinine Clearance: 67.9 mL/min (by C-G formula based on SCr of 0.73 mg/dL). Recent Labs   Lab 06/13/18 1804 06/13/18 2256 06/14/18 0345  WBC 26.8* 28.8* 21.4*    Liver Function Tests: No results for input(s): AST, ALT, ALKPHOS, BILITOT, PROT, ALBUMIN in the last 168 hours. No results for input(s): LIPASE, AMYLASE in the last 168 hours. No results for input(s): AMMONIA in the last 168 hours.  ABG    Component Value Date/Time   TCO2 29 09/13/2012 1406     Coagulation Profile: No results for input(s): INR, PROTIME in the last 168 hours.  Cardiac Enzymes: Recent Labs  Lab 06/13/18 2256 06/14/18 0650  TROPONINI 0.44* 0.20*    HbA1C: No results found for: HGBA1C  CBG: No results for input(s): GLUCAP in the last 168 hours.  Review of Systems:     Past Medical History  She,  has a past medical history of Allergic rhinitis, Anxiety, B12 deficiency, Barrett's esophagus, Chronic constipation, Congenital cystic disease of lung, Depression, GERD (gastroesophageal reflux disease), Hiatal hernia, History of DVT of lower extremity, History of palpitations, Hypertension, Idiopathic chronic gout, Iron deficiency anemia, Migraines, Personal history of PE (pulmonary embolism), PONV (postoperative nausea and vomiting), Tinea versicolor, and Vitamin D deficiency.   Surgical History    Past Surgical History:  Procedure Laterality Date  . BIOPSY  11/29/2017   Procedure: BIOPSY;  Surgeon: Vida RiggerMagod, Marc, MD;  Location: [facility];  Service: Endoscopy;;  . BRONCHOSCOPY  05-20-2004  dr clance  . ESOPHAGOGASTRODUODENOSCOPY (EGD) WITH PROPOFOL N/A 11/29/2017   Procedure: ESOPHAGOGASTRODUODENOSCOPY (EGD) WITH PROPOFOL;  Surgeon: Vida RiggerMagod, Marc, MD;  Location: [facility];  Service: Endoscopy;  Laterality: N/A;  . EXPLORATORY LAPAROTOMY  AGE 24    [facility]   for constipation  . INSERTION OF MESH N/A 12/26/2017   Procedure: INSERTION OF BIO A MESH;  Surgeon: Axel Filleramirez, Armando, MD;  Location: [facility];  Service: General;  Laterality: N/A;  . KNEE ARTHROSCOPY W/ ACL RECONSTRUCTION Right 09/2003   . LAPAROSCOPY BILATERAL TUBAL FULGERATION AND ATTEMPTED THERMACHOICE ABLATION WITH UTERINE PERFORATION  07-17-2009   [facility]  . LUNG LOBECTOMY  1991   right lower lobe for massive hemoptyosis  . MINI-THORACOTOMY WEDGE RESECTION OF LEFT LOWER LOBE CYST Left 06-24-2004   [facility]   hem[facility]osis     Social History   reports that she has never smoked. She has never used smokeless tobacco. She reports current alcohol use. She reports that she does not use drugs.   Family History   Her family history includes Diabetes in an other family member; Hypertension in an other family member.   Allergies Allergies  Allergen Reactions  . Guaifenesin Shortness Of Breath  . Iron Anaphylaxis and Other (See Comments)    IV only  . Codeine Other (See Comments)    tiredness  . Doxepin Hives  . Hydromorphone Hcl Nausea And Vomiting  . Metoprolol Succinate Other (See Comments)    REACTION: edema  . Morphine Other (See Comments)  Does not work; hallucinations and irritable  . Pantoprazole Sodium Other (See Comments)    heartburn  . Pseudoeph-Doxylamine-Dm-Apap Hives  . Pseudoephedrine Hives  . Sulfa Antibiotics Hives  . Prednisone Rash  . Tetracyclines & Related Rash     Home Medications  Prior to Admission medications   Medication Sig Start Date End Date Taking? Authorizing Provider  allopurinol (ZYLOPRIM) 100 MG tablet Take 1 tablet (100 mg total) by mouth daily. Must keep visit on 11/03/17 to get refills Patient taking differently: Take 100 mg by mouth daily as needed (for gout flare up). Must keep visit on 11/03/17 to get refills 11/03/17  Yes Plotnikov, Georgina QuintAleksei V, MD  aspirin-acetaminophen-caffeine (EXCEDRIN MIGRAINE) 920-062-3810250-250-65 MG tablet Take 1 tablet by mouth every 6 (six) hours as needed for headache or migraine.    Yes [provider]  Cholecalciferol (VITAMIN D3) 2000 units TABS Take 2,000 Units by mouth every morning.    Yes [provider]  diazepam  (VALIUM) 5 MG tablet TAKE 1 TABLET BY MOUTH EVERY 12 HOURS IF NEEDED FOR ANXIETY Patient taking differently: Take 5 mg by mouth every 12 (twelve) hours as needed for anxiety. take 1 tablet by mouth every 12 hours if needed for anxiety 05/16/18  Yes Plotnikov, Georgina QuintAleksei V, MD  ferrous gluconate (IRON 27) 240 (27 FE) MG tablet Take 240 mg by mouth every morning.    Yes [provider]  hydrochlorothiazide (HYDRODIURIL) 25 MG tablet Take 25 mg by mouth every morning.    Yes [provider]  Multiple Vitamins-Minerals (MULTIVITAMIN ADULTS PO) Take 1 tablet by mouth every morning.    Yes [provider]  potassium chloride SA (K-DUR,KLOR-CON) 20 MEQ tablet Take 20 mEq by mouth every morning.    Yes [provider]  sennosides-docusate sodium (SENOKOT-S) 8.6-50 MG tablet Take 1 tablet by mouth daily as needed for constipation.   Yes [provider]  ciprofloxacin (CIPRO) 500 MG tablet Take 1 tablet (500 mg total) by mouth 2 (two) times daily. Patient not taking: Reported on 06/13/2018 04/11/18   Myrlene Brokerrawford, Elizabeth A, MD    Alyson ReedyWesam G. , M.D. [facility]. Pager: [phone]. After hours pager: [phone]

## 2018-06-15 DIAGNOSIS — J984 Other disorders of lung: Secondary | ICD-10-CM | POA: Diagnosis present

## 2018-06-15 DIAGNOSIS — J432 Centrilobular emphysema: Secondary | ICD-10-CM | POA: Diagnosis present

## 2018-06-15 DIAGNOSIS — R042 Hemoptysis: Secondary | ICD-10-CM | POA: Diagnosis present

## 2018-06-15 DIAGNOSIS — R7989 Other specified abnormal findings of blood chemistry: Secondary | ICD-10-CM | POA: Diagnosis not present

## 2018-06-15 DIAGNOSIS — Z888 Allergy status to other drugs, medicaments and biological substances status: Secondary | ICD-10-CM | POA: Diagnosis not present

## 2018-06-15 DIAGNOSIS — K22719 Barrett's esophagus with dysplasia, unspecified: Secondary | ICD-10-CM | POA: Diagnosis not present

## 2018-06-15 DIAGNOSIS — Z86718 Personal history of other venous thrombosis and embolism: Secondary | ICD-10-CM | POA: Diagnosis not present

## 2018-06-15 DIAGNOSIS — K219 Gastro-esophageal reflux disease without esophagitis: Secondary | ICD-10-CM | POA: Diagnosis present

## 2018-06-15 DIAGNOSIS — R1310 Dysphagia, unspecified: Secondary | ICD-10-CM | POA: Diagnosis not present

## 2018-06-15 DIAGNOSIS — I10 Essential (primary) hypertension: Secondary | ICD-10-CM | POA: Diagnosis present

## 2018-06-15 DIAGNOSIS — I248 Other forms of acute ischemic heart disease: Secondary | ICD-10-CM | POA: Diagnosis present

## 2018-06-15 DIAGNOSIS — K2270 Barrett's esophagus without dysplasia: Secondary | ICD-10-CM | POA: Diagnosis present

## 2018-06-15 DIAGNOSIS — Z86711 Personal history of pulmonary embolism: Secondary | ICD-10-CM | POA: Diagnosis not present

## 2018-06-15 DIAGNOSIS — R4702 Dysphasia: Secondary | ICD-10-CM | POA: Diagnosis present

## 2018-06-15 DIAGNOSIS — Z681 Body mass index (BMI) 19 or less, adult: Secondary | ICD-10-CM | POA: Diagnosis not present

## 2018-06-15 DIAGNOSIS — Z885 Allergy status to narcotic agent status: Secondary | ICD-10-CM | POA: Diagnosis not present

## 2018-06-15 DIAGNOSIS — R55 Syncope and collapse: Secondary | ICD-10-CM | POA: Diagnosis present

## 2018-06-15 DIAGNOSIS — J181 Lobar pneumonia, unspecified organism: Secondary | ICD-10-CM

## 2018-06-15 DIAGNOSIS — Z882 Allergy status to sulfonamides status: Secondary | ICD-10-CM | POA: Diagnosis not present

## 2018-06-15 DIAGNOSIS — E440 Moderate protein-calorie malnutrition: Secondary | ICD-10-CM | POA: Diagnosis present

## 2018-06-15 DIAGNOSIS — E876 Hypokalemia: Secondary | ICD-10-CM | POA: Diagnosis present

## 2018-06-15 DIAGNOSIS — J690 Pneumonitis due to inhalation of food and vomit: Secondary | ICD-10-CM | POA: Diagnosis present

## 2018-06-15 DIAGNOSIS — R51 Headache: Secondary | ICD-10-CM | POA: Diagnosis present

## 2018-06-15 DIAGNOSIS — Y95 Nosocomial condition: Secondary | ICD-10-CM | POA: Diagnosis present

## 2018-06-15 DIAGNOSIS — Z79899 Other long term (current) drug therapy: Secondary | ICD-10-CM | POA: Diagnosis not present

## 2018-06-15 LAB — CBC
HCT: 35.1 % — ABNORMAL LOW (ref 36.0–46.0)
Hemoglobin: 11.4 g/dL — ABNORMAL LOW (ref 12.0–15.0)
MCH: 29.2 pg (ref 26.0–34.0)
MCHC: 32.5 g/dL (ref 30.0–36.0)
MCV: 90 fL (ref 80.0–100.0)
Platelets: 283 10*3/uL (ref 150–400)
RBC: 3.9 MIL/uL (ref 3.87–5.11)
RDW: 13.8 % (ref 11.5–15.5)
WBC: 14 10*3/uL — ABNORMAL HIGH (ref 4.0–10.5)
nRBC: 0 % (ref 0.0–0.2)

## 2018-06-15 LAB — LEGIONELLA PNEUMOPHILA SEROGP 1 UR AG: L. pneumophila Serogp 1 Ur Ag: NEGATIVE

## 2018-06-15 LAB — BASIC METABOLIC PANEL
Anion gap: 10 (ref 5–15)
BUN: 8 mg/dL (ref 6–20)
CO2: 23 mmol/L (ref 22–32)
Calcium: 8.2 mg/dL — ABNORMAL LOW (ref 8.9–10.3)
Chloride: 107 mmol/L (ref 98–111)
Creatinine, Ser: 0.65 mg/dL (ref 0.44–1.00)
GFR calc Af Amer: >60 mL/min (ref >60)
GFR calc non Af Amer: >60 mL/min (ref >60)
Glucose, Bld: 84 mg/dL (ref 70–99)
Potassium: 3.4 mmol/L — ABNORMAL LOW (ref 3.5–5.1)
Sodium: 140 mmol/L (ref 135–145)

## 2018-06-15 MED ORDER — COLCHICINE 0.6 MG PO TABS
0.6000 mg | ORAL_TABLET | Freq: Every day | ORAL | Status: AC
  Administered 2018-06-15 – 2018-06-16 (×2): 0.6 mg via ORAL
  Filled 2018-06-15 (×2): qty 1

## 2018-06-15 MED ORDER — ALLOPURINOL 100 MG PO TABS
100.0000 mg | ORAL_TABLET | Freq: Every day | ORAL | Status: DC
  Administered 2018-06-15 – 2018-06-16 (×2): 100 mg via ORAL
  Filled 2018-06-15 (×2): qty 1

## 2018-06-15 MED ORDER — COLCHICINE 0.6 MG PO TABS
1.2000 mg | ORAL_TABLET | Freq: Once | ORAL | Status: AC
  Administered 2018-06-15: 1.2 mg via ORAL
  Filled 2018-06-15: qty 2

## 2018-06-15 NOTE — Progress Notes (Addendum)
Progress Note    Daiva EvesBrandi D Schopf  ZOX:[id number] DOB: 07/18/1970  DOA: 06/13/2018 PCP: Tresa GarterPlotnikov, Aleksei V, MD    Brief Narrative:   Chief complaint: Follow-up hemoptysis  Medical records reviewed and are as summarized below:  [name] is an 47 y.o. 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  In the ED, noted to be hypertensive with a blood pressure of 140/109, pulse 110, but was otherwise afebrile.  A CT angiogram showed right lower lobe infiltrates and a 3 cm cyst.  WBC was 26,000.  Because of her headaches, the admitting MD ordered an MRI which was abnormal with pseudoaneurysms and a concern for fibromuscular dysplasia.  Given her cystic lung disease and these new abnormalities, pulmonology and neurology subsequently consulted.  Assessment/Plan:   Principal Problem:   Aspiration CAP (community acquired pneumonia) associated with hemoptysis and syncope Patient had a known syncopal event with significant hemoptysis.  CT scan personally reviewed and shows a significant right-sided infiltrate around a large cystic area as pictured below.  Placed on empiric Rocephin and azithromycin.  Will discontinue the azithromycin as this is not likely an atypical pneumonia, more likely from aspiration.  Follow-up blood cultures, sputum culture, and Legionella antigen testing.  Strep pneumonia antigen negative, HIV nonreactive.  Pulmonary critical care consulted by admitting MD, and they recommend outpatient follow-up with pulmonology and further diagnostic work-up for her cystic lung disease.  Given the MRI findings, also recommended a neurology evaluation.  Tachycardia improved and remains afebrile.  WBC remains beginning to come down and is 14 this morning.  Has had  a 3 g drop in her hemoglobin, but suspect this is partially dilutional, no further drop over the past 24 hours.  Patient continues to feel very weak.  Anxious about her diagnosis and implications/long-term prognosis.  Once stable for discharge, will need a return to work note and I recommend that she be excluded from working until her significant fatigue has improved.  We will moved to inpatient status as she is not discharging today as she needs a further diagnostic evaluation and continues to be profoundly weak.  Obtain PT evaluation.   Active Problems:   Troponin elevation likely from demand ischemia Troponin 0 0.44.  Likely demand ischemia in the setting of tachycardia, syncopal event and acute infection.  Troponins have trended down.  No aspirin given her significant history of hemoptysis.  2D echocardiogram done 06/14/2018: EF 60-65% but images were inadequate for LV wall motion assessment.    Dysphasia/moderate malnutrition Speech therapy evaluation requested.  Evaluated by dietitian found to have moderate malnutrition related to chronic illness evidenced by moderate muscle depletion.  Continue Protostat and boost supplementation.    Headache with possible fibromuscular dysplasia noted on MRI Given syncopal event, admitting MD ordered an MRI of the brain.  MRI personally reviewed with abnormal blood vessels as pictured below.  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 Suspect underlying fibromuscular dysplasia.  Neurology consulted.  Will need need further evaluation, particularly with a history of multiple pulmonary cysts.    Hypokalemia Remains hypokalemic despite supplementation, but improving so we will continue 3 times daily potassium supplementation.    Essential hypertension  Continue hydrochlorothiazide.    Barrett's esophagus S/P  Nissen fundoplication (without gastrostomy tube) procedure Continue Pepcid.    Gout Patient reports a flare in her gout overnight.  Given a dose of colchicine.  Will repeat x2.  Resume allopurinol.  Family Communication/Anticipated D/C date and plan/Code Status   DVT prophylaxis: SCDs ordered. Code Status: Full Code.  Family Communication: Sister at the bedside. Disposition Plan: Likely will need further diagnostic evaluation given findings on MRI.  Anticipate another 48 hours in the hospital.   Medical Consultants:    Pulmonology   Anti-Infectives:    Rocephin 06/13/2018--->  Azithromycin 06/13/2018--->  Subjective:   Continues to feel quite weak.  Denies significant cough.  No frank shortness of breath.  Main symptom is profound weakness.  Appetite fair.  No diarrhea.  Very anxious about her diagnosis.  Unable to sleep through the night.  Reports gout pain for which the cross covering MD gave her a dose of colchicine.  Objective:    Vitals:   06/14/18 0515 06/14/18 1600 06/14/18 2142 06/15/18 0348  BP: 128/90 119/88 (!) 127/91 122/84  Pulse: (!) 105 84 89 87  Resp: 18 18 18 18   Temp: 97.8 F (36.6 C) 98.1 F (36.7 C) 98.1 F (36.7 C) 98.4 F (36.9 C)  TempSrc: Oral Oral Oral Oral  SpO2: 94% 99% 95% 97%  Weight:      Height:        Intake/Output Summary (Last 24 hours) at 06/15/2018 0730 Last data filed at 06/15/2018 0600 Gross per 24 hour  Intake 590 ml  Output 1000 ml  Net -410 ml   Filed Weights   06/13/18 1631 06/13/18 2207  Weight: 53 kg 50 kg    Exam: General: No acute distress although quite anxious. Cardiovascular: Heart sounds show a regular rate, and rhythm. No gallops or rubs. No murmurs. No JVD. Lungs: Area on the right a little more clear today. Abdomen: Soft, nontender, nondistended with normal active bowel sounds. No masses. No hepatosplenomegaly. Neurological: Alert and oriented 3. Moves all extremities 4 with equal strength.  Cranial nerves II through XII grossly intact. Skin: Warm and dry.  Left periorbital bruising and bruise to the left upper arm now visible. Extremities: No clubbing or cyanosis. No edema. Pedal pulses 2+. Psychiatric: Mood and affect are anxious. Insight and judgment are normal.  Data Reviewed:   I have personally reviewed following labs and imaging studies:  Labs: Labs show the following:   Basic Metabolic Panel: Recent Labs  Lab 06/13/18 1804 06/14/18 0345 06/15/18 0415  NA 142 141 140  K 3.4* 3.2* 3.4*  CL 105 110 107  CO2 22 22 23   GLUCOSE 96 111* 84  BUN 19 17 8   CREATININE 0.81 0.73 0.65  CALCIUM 9.5 8.3* 8.2*   GFR Estimated Creatinine Clearance: 67.9 mL/min (by C-G formula based on SCr of 0.65 mg/dL).  CBC: Recent Labs  Lab 06/13/18 1804 06/13/18 2256 06/14/18 0345 06/15/18 0415  WBC 26.8* 28.8* 21.4* 14.0*  HGB 14.5 12.9 11.3* 11.4*  HCT 44.0 39.4 35.1* 35.1*  MCV 89.8 90.6 90.7 90.0  PLT 338 323 288 283   Cardiac Enzymes: Recent Labs  Lab 06/13/18 2256 06/14/18 0650 06/14/18 1240 06/14/18 1836  TROPONINI 0.44* 0.20* 0.12* 0.08*   Sepsis Labs: Recent Labs  Lab 06/13/18 1804 06/13/18 2256 06/14/18 0345 06/15/18 0415  WBC 26.8* 28.8* 21.4* 14.0*    Microbiology No results found for this or any previous visit (from the past 240 hour(s)).  Procedures and diagnostic studies:  Dg Chest 2 View  Result Date: 06/13/2018 CLINICAL DATA:  Syncopal episode. Patient states she was out for 20 minutes and woke up with a pool of blood in her mouth. EXAM: CHEST - 2 VIEW COMPARISON:  06/29/2017 CXR FINDINGS: Patchy opacity at the right lung base suspicious for aspiration. Pulmonary hyperinflation with blunting the costophrenic angles is otherwise noted. No overt pulmonary edema. Heart and mediastinal contours are normal. No acute osseous abnormality is seen IMPRESSION: Patchy airspace opacity at the right lung base, suspicious for aspiration. Electronically  Signed   By: Tollie Eth M.D.   On: 06/13/2018 18:37   Ct Angio Chest Pe W And/or Wo Contrast  Result Date: 06/13/2018 CLINICAL DATA:  Syncopal episode 1230 today. EXAM: CT ANGIOGRAPHY CHEST WITH CONTRAST TECHNIQUE: Multidetector CT imaging of the chest was performed using the standard protocol during bolus administration of intravenous contrast. Multiplanar CT image reconstructions and MIPs were obtained to evaluate the vascular anatomy. CONTRAST:  ISOVUE-370 IOPAMIDOL (ISOVUE-370) INJECTION 76% COMPARISON:  None. FINDINGS: Cardiovascular: Normal heart size without pericardial effusion or thickening. No significant coronary arteriosclerosis. Satisfactory opacification of pulmonary arteries without embolus. Nonaneurysmal thoracic aorta dissection. Conventional branch pattern of the great vessels. Mediastinum/Nodes: No enlarged mediastinal, hilar, or axillary lymph nodes. The thyroid gland is normal in size without dominant mass. Trachea is patent. Dilated debris-filled esophagus associated with a moderate-sized hiatal hernia. Lungs/Pleura: Patchy airspace opacities in the right lower lobe outlining what appears be a thin walled pulmonary cyst measures approximately 3.2 x 2 x 0.7 cm. No effusion or pneumothorax. Mild centrilobular emphysema in the upper lobes. Upper Abdomen: No acute abnormality. Musculoskeletal: No chest wall abnormality. No acute or significant osseous findings. Review of the MIP images confirms the above findings. IMPRESSION: 1. No acute pulmonary embolus. 2. Patchy airspace opacities in the right lower lobe outlining what appears to be a thin walled pulmonary cyst measuring 3.2 x 2 x 0.7 cm. Findings raise concern for pneumonia possibly aspiration related. 3. Moderate-sized hiatal hernia dilated air-filled esophagus containing frothy material within. Emphysema (ICD10-J43.9). Electronically Signed   By: Tollie Eth M.D.   On: 06/13/2018 21:55   Mr Brain Wo Contrast  Result Date:  06/14/2018 CLINICAL DATA:  47 year old female with syncopal episode 12:30 p.m. yesterday. Worst headache of life. EXAM: MRI HEAD WITHOUT CONTRAST TECHNIQUE: Multiplanar, multiecho pulse sequences of the brain and surrounding structures were obtained without intravenous contrast. COMPARISON:  Head CT without contrast 10/17/2005. FINDINGS: Brain: Cerebral volume similar to the 2007 CT with mild chronic ventriculomegaly for age. The temporal horns remain diminutive. No restricted diffusion to suggest acute infarction. No midline shift, mass effect, evidence of mass lesion, extra-axial collection or acute intracranial hemorrhage. Cervicomedullary junction and pituitary are within normal limits. Volume loss in the posterior body of the corpus callosum. Moderate for age patchy nonspecific cerebral white matter T2 and FLAIR hyperintensity, with periventricular involvement on the left. Mild posterior left temporal horn involvement in addition to left periatrial involvement. No cortical encephalomalacia or chronic cerebral blood products. Signal in the deep gray matter nuclei, brainstem, and cerebellum is normal. There is a small volume of left lateral cerebellum herniated into a prominent left occipital arachnoid granulation (series 6, image 7 and series 9, image 14. Vascular: Major intracranial vascular flow voids are preserved with generalized intracranial artery tortuosity. Dolichoectatic dominant distal left vertebral artery. Additionally cervical carotid arteries appear dolichoectatic and/or aneurysmal bilaterally (right ICA at the C1 level diameter estimated at 9  millimeters on series 4, image 9, and contralateral left ICA at that level up to 11 millimeters. Skull and upper cervical spine: Negative visible cervical spine. Normal bone marrow signal. Sinuses/Orbits: Negative. Other: Mastoids are clear. Visible internal auditory structures appear normal. Scalp and face soft tissues appear negative. IMPRESSION: 1.  No  acute intracranial abnormality. 2. 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 Consider fibromuscular dysplasia (FMD) or other connective tissue disease. 3. Chronic nonspecific ventriculomegaly for age appears not significantly changed since 2007. 4. Superimposed moderate nonspecific cerebral white matter signal changes. Associated chronic volume loss in the posterior corpus callosum. Differential considerations include accelerated small vessel ischemia, sequelae of trauma, hypercoagulable state, vasculitis, migraines, prior infection or demyelination. 5. A small volume of the left lateral cerebellum is herniated into an adjacent arachnoid granulation, and usually this is an asymptomatic finding. Electronically Signed   By: Odessa FlemingH  Hall M.D.   On: 06/14/2018 11:59    Medications:   . famotidine  20 mg Oral BID  . feeding supplement  1 Container Oral Q24H  . feeding supplement (PRO-STAT SUGAR FREE 64)  30 mL Oral BID  . ferrous gluconate  324 mg Oral q morning - 10a  . hydrochlorothiazide  25 mg Oral q morning - 10a  . potassium chloride SA  20 mEq Oral TID   Continuous Infusions: . azithromycin 500 mg (06/14/18 1726)  . cefTRIAXone (ROCEPHIN)  IV 1 g (06/14/18 2057)    LOS: 0 days  40 minutes with greater than 50% of the total time spent in face-to-face interaction with the patient and her sister who required extensive reassurance, and review of her diagnostic testing with possible differential diagnoses, and plan of care.  Trula OreChristina Gregorio Worley  [facility] Pager [phone]. If unable to reach me by pager, please call my cell phone at [phone].  *Please refer to [email or web address], password [email or web address] to get updated schedule on who will round on this patient, as hospitalists switch teams weekly. If 7PM-7AM, please contact night-coverage at [email or web address], password [email or web address] for any overnight needs.  06/15/2018,  7:30 AM

## 2018-06-15 NOTE — Progress Notes (Signed)
Contacted by Dr. Darnelle Catalanama, patient has questions about the pulmonary cyst.  The pulmonary cyst is essentially an incidental finding at this point.  There is no need for any interventions at this point but will need a repeat CT in 3-6 months to evaluate growth of the cyst and f/u with pulmonary as outpatient when ready for discharge.  All questions answered.  No acute interventions indicated.  We discussed the MRI findings with neurology, the feel is that cyst is an incidental finding however there are multiple findings in the MRI that are concerning in the non-acute setting and that f/u as outpatient with neurology is recommended.  They did not feel it necessary to see patient in the inpatient setting.    All information and phone numbers for PCCM and neurology were given to the patient as offices have been closed.  PCCM will be available PRN.  Alyson ReedyWesam G. Yacoub, M.D. [facility]. Pager: [phone]. After hours pager: [phone].

## 2018-06-15 NOTE — Evaluation (Signed)
Physical Therapy Evaluation Patient Details Name: Carla EvesBrandi D Runyon MRN: [id number] DOB: 01/11/1971 Today's Date: 06/15/2018   History of Present Illness  47 y.o. 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  A CT angiogram showed right lower lobe infiltrates and a 3 cm cyst.  Clinical Impression  Patient evaluated by Physical Therapy with no further acute PT needs identified. All education has been completed and the patient has no further questions.  Pt requiring increased time to perform mobility however reports generalized weakness.  Pt also reports syncope episodes prior to admission so performed with min/guard assist for mobility for safety today however pt performing well and without any episodes.  Pt assisted first to bathroom and then ambulated in hallway.  Pt utilized RW for safety today due to generalized weakness and encouraged to use upon d/c until feeling better (pt reports being at 30% her baseline).  Pt lives with her sister.  Pt encouraged to ambulate to tolerance with nursing staff, gait belt and RW during remainder of acute stay for safety and increasing mobility slowly.  See below for any follow-up Physical Therapy or equipment needs. PT is signing off. Thank you for this referral.     Follow Up Recommendations No PT follow up    Equipment Recommendations  None recommended by PT    Recommendations for Other Services       Precautions / Restrictions Precautions Precautions: Fall      Mobility  Bed Mobility Overal bed mobility: Needs Assistance Bed Mobility: Supine to Sit;Sit to Supine     Supine to sit: Supervision Sit to supine: Supervision   General bed mobility comments: increased time  Transfers Overall  transfer level: Needs assistance Equipment used: Rolling walker (2 wheeled) Transfers: Sit to/from Stand Sit to Stand: Min guard         General transfer comment: min/guard for safety since pt reports syncope episodes prior to admission  Ambulation/Gait Ambulation/Gait assistance: Min guard Gait Distance (Feet): 400 Feet Assistive device: Rolling walker (2 wheeled) Gait Pattern/deviations: Step-through pattern;Narrow base of support;Decreased stride length     General Gait Details: slow but steady pace with RW, 3 standing rest breaks due to fatigue, pt reports generalized weakness, no LOB or syncope episodes  Stairs            Wheelchair Mobility    Modified Rankin (Stroke Patients Only)       Balance Overall balance assessment: Needs assistance         Standing balance support: No upper extremity supported Standing balance-Leahy Scale: Fair Standing balance comment: able to stand without UE support, utilized RW for ambulation for endurance                             Pertinent Vitals/Pain Pain Assessment: No/denies pain    Home Living Family/patient expects to be discharged to:: Private residence Living Arrangements: Other relatives(sister and brother in [place]) Available Help at Discharge: Family Type of Home: House       Home Layout: One level Home Equipment: Environmental consultantWalker - 2 wheels      Prior Function Level of Independence: Independent               Hand Dominance  Extremity/Trunk Assessment        Lower Extremity Assessment Lower Extremity Assessment: Generalized weakness    Cervical / Trunk Assessment Cervical / Trunk Assessment: Normal  Communication   Communication: No difficulties  Cognition Arousal/Alertness: Awake/alert Behavior During Therapy: WFL for tasks assessed/performed Overall Cognitive Status: Within Functional Limits for tasks assessed                                         General Comments      Exercises     Assessment/Plan    PT Assessment Patent does not need any further PT services  PT Problem List         PT Treatment Interventions      PT Goals (Current goals can be found in the Care Plan section)  Acute Rehab PT Goals PT Goal Formulation: All assessment and education complete, DC therapy    Frequency     Barriers to discharge        Co-evaluation               AM-PAC PT "6 Clicks" Mobility  Outcome Measure Help needed turning from your back to your side while in a flat bed without using bedrails?: None Help needed moving from lying on your back to sitting on the side of a flat bed without using bedrails?: None Help needed moving to and from a bed to a chair (including a wheelchair)?: A Little Help needed standing up from a chair using your arms (e.g., wheelchair or bedside chair)?: A Little Help needed to walk in hospital room?: A Little Help needed climbing 3-5 steps with a railing? : A Little 6 Click Score: 20    End of Session Equipment Utilized During Treatment: Gait belt Activity Tolerance: Patient tolerated treatment well Patient left: in bed;with call bell/phone within reach;with bed alarm set;with family/visitor present Nurse Communication: Mobility status PT Visit Diagnosis: Muscle weakness (generalized) (M62.81)    Time: [id number] PT Time Calculation (min) (ACUTE ONLY): 34 min   Charges:   PT Evaluation $PT Eval Low Complexity: 1 Low PT Treatments $Gait Training: 8-22 mins      Zenovia JarredKati Merriam Brandner, PT, DPT Acute Rehabilitation Services Office: [phone] Pager: [phone]  Sarajane JewsLEMYRE,KATHrine E 06/15/2018, 3:53 PM

## 2018-06-16 DIAGNOSIS — R7989 Other specified abnormal findings of blood chemistry: Secondary | ICD-10-CM

## 2018-06-16 DIAGNOSIS — R55 Syncope and collapse: Secondary | ICD-10-CM

## 2018-06-16 DIAGNOSIS — E440 Moderate protein-calorie malnutrition: Secondary | ICD-10-CM

## 2018-06-16 DIAGNOSIS — E876 Hypokalemia: Secondary | ICD-10-CM

## 2018-06-16 LAB — CBC
HCT: 34.7 % — ABNORMAL LOW (ref 36.0–46.0)
Hemoglobin: 11 g/dL — ABNORMAL LOW (ref 12.0–15.0)
MCH: 28.7 pg (ref 26.0–34.0)
MCHC: 31.7 g/dL (ref 30.0–36.0)
MCV: 90.6 fL (ref 80.0–100.0)
Platelets: 288 10*3/uL (ref 150–400)
RBC: 3.83 MIL/uL — ABNORMAL LOW (ref 3.87–5.11)
RDW: 13.5 % (ref 11.5–15.5)
WBC: 11.3 10*3/uL — ABNORMAL HIGH (ref 4.0–10.5)
nRBC: 0 % (ref 0.0–0.2)

## 2018-06-16 LAB — BASIC METABOLIC PANEL
Anion gap: 11 (ref 5–15)
BUN: 9 mg/dL (ref 6–20)
CO2: 23 mmol/L (ref 22–32)
Calcium: 8.5 mg/dL — ABNORMAL LOW (ref 8.9–10.3)
Chloride: 105 mmol/L (ref 98–111)
Creatinine, Ser: 0.6 mg/dL (ref 0.44–1.00)
GFR calc Af Amer: >60 mL/min (ref >60)
GFR calc non Af Amer: >60 mL/min (ref >60)
Glucose, Bld: 101 mg/dL — ABNORMAL HIGH (ref 70–99)
Potassium: 3.5 mmol/L (ref 3.5–5.1)
Sodium: 139 mmol/L (ref 135–145)

## 2018-06-16 MED ORDER — PRO-STAT SUGAR FREE PO LIQD: 30.0000 mL | Freq: Two times a day (BID) | ORAL | 0 refills | Status: DC

## 2018-06-16 MED ORDER — FAMOTIDINE 20 MG PO TABS: 20.0000 mg | ORAL_TABLET | Freq: Two times a day (BID) | ORAL | 0 refills | Status: DC

## 2018-06-16 MED ORDER — AMOXICILLIN-POT CLAVULANATE 875-125 MG PO TABS: 1.0000 | ORAL_TABLET | Freq: Two times a day (BID) | ORAL | 0 refills | Status: AC

## 2018-06-16 MED ORDER — POTASSIUM CHLORIDE CRYS ER 20 MEQ PO TBCR: 40.0000 meq | EXTENDED_RELEASE_TABLET | Freq: Every morning | ORAL | 0 refills | Status: DC

## 2018-06-16 NOTE — Progress Notes (Signed)
D/c'ing to home w/ sister. Voices no c/o. NAD noted. All d/c instructions given w/ verbal understanding.

## 2018-06-16 NOTE — Discharge Instructions (Addendum)

## 2018-06-16 NOTE — Discharge Summary (Signed)
Physician Discharge Summary  [name] WUJ:[id number]:[id number] DOB: 09/17/1970  PCP: Tresa GarterPlotnikov, Aleksei V, MD  Admit date: 06/13/2018 Discharge date: 06/16/2018  Recommendations for Outpatient Follow-up:  1. Dr. Jacinta ShoeAleksei Plotnikov, PCP in 3 days with repeat labs (CBC & BMP). 2. [facility]: Patient was provided with contact information by the Pulmonologist to call their office for a new consultation appointment. 3. [facility]: Patient was provided with contact information by the Pulmonologist to call their office for a new consultation appointment.  I also sent ambulatory neurology referral. 4. Consider outpatient speech therapy consultation for swallow evaluation. 5. Recommend outpatient GI consultation/follow-up soon for further evaluation of hiatal hernia.  Home Health: None Equipment/Devices: None  Discharge Condition: Improved and stable CODE STATUS: Full Diet recommendation: Heart healthy diet.  Discharge Diagnoses:  Principal Problem:   CAP (community acquired pneumonia) Active Problems:   Essential hypertension   Barrett's esophagus   Hypokalemia   S/P Nissen fundoplication (without gastrostomy tube) procedure   Hemoptysis   Syncope   Elevated troponin likely from demand ischemia   Aspiration pneumonia of right lower lobe (HCC)   Pulmonary emphysema (HCC)   Dysphagia   Malnutrition of moderate degree   Aspiration pneumonia (HCC)   Brief Summary: [name] is an 47 y.o. 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  In the ED, noted to be hypertensive with a blood pressure of 140/109, pulse 110, but was otherwise afebrile.  A CT angiogram showed right lower lobe infiltrates and a 3 cm cyst.  WBC was 26,000.  Because  of her headaches, the admitting MD ordered an MRI which was abnormal with pseudoaneurysms and a concern for fibromuscular dysplasia.  Given her cystic lung disease and these new abnormalities, pulmonology subsequently consulted.  Assessment and plan:  Principal Problem:   Aspiration CAP (community acquired pneumonia) associated with hemoptysis and syncope Patient had a known syncopal event with significant hemoptysis.  CT scan personally reviewed and shows a significant right-sided infiltrate around a large cystic area as pictured below.  Placed on empiric Rocephin and azithromycin.  Azithromycin discontinued as this is not likely an atypical pneumonia, more likely from aspiration related to syncope.  Urine Legionella and pneumococcal antigen negative.  Blood cultures x2 were negative to date. HIV nonreactive. UDS negative. Pulmonary critical care consulted by admitting MD, and they recommend outpatient follow-up with pulmonology and further diagnostic work-up for her cystic lung disease.  Given the MRI findings, also recommended a Neurology evaluation.  Tachycardia improved and remains afebrile.  Leukocytosis has almost resolved.  Has had a 3 g drop in her hemoglobin, but suspect this is partially dilutional, no further drop in hemoglobin has been stable in the 11 g range for the last 3 days. She was anxious about her diagnosis and implications/long-term prognosis but has been reassured by the pulmonologist and the hospitalist team.  PT has evaluated and has no outpatient needs.  RN has ambulated patient and she is not hypoxic.   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  There is no need for any interventions at this point but will need a repeat CT in 3 to 6 months to evaluate growth of the cyst and follow-up with pulmonary as outpatient.  Hemoptysis has resolved without recurrence.  Patient reportedly has had previous  episodes of hemoptysis when patient developed some lung infection.  Low index of suspicion for pulmonary TB.    Active Problems:   Troponin elevation likely from demand ischemia Likely demand ischemia in the setting of tachycardia, syncopal event and acute infection.  Troponins have trended down.  No aspirin given her significant history of hemoptysis.  2D echocardiogram done 06/14/2018: EF 60-65% but images were inadequate for LV wall motion assessment.    Dysphasia/moderate malnutrition Evaluated by dietitian found to have moderate malnutrition related to chronic illness evidenced by moderate muscle depletion.  Continue Protostat and boost supplementation.  Although speech therapy consultation was requested, this has not yet occurred.  As per discussion with patient and her RN, she has had no difficulty swallowing or concerns for aspiration.  Patient also insistent on being discharged today and speech therapy swallow evaluation can be obtained as outpatient.    Headache with possible fibromuscular dysplasia noted on MRI Given syncopal event, admitting MD ordered an MRI of the brain.  MRI head report as below.  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 Suspect underlying fibromuscular dysplasia. Will need need further evaluation, particularly with a history of multiple pulmonary cysts.  Pulmonology evaluated patient on 12/28 and they discussed the MRI brain findings with neurology and neurology indicated that there are multiple findings in the MRI that are concerning in the nonacute setting and that follow-up as outpatient with neurology is recommended.  They did not feel it necessary to see patient in the inpatient setting.  Again pulmonology provided patient with contact details for [facility] follow-up.  I discussed with the  consulting pulmonologist today.    Hypokalemia Replaced.  Likely related to HCTZ.  Potassium dose was increased at discharge.  Follow BMP closely as outpatient.    Essential hypertension Continue hydrochlorothiazide.  Controlled.    Barrett's esophagus S/P Nissen fundoplication (without gastrostomy tube) procedure Continue Pepcid.    Gout Patient reports a flare in her gout overnight 2 nights ago.  Given a dose of colchicine.  Resolved.  Resumed allopurinol.  Syncope Without prodrome.  Reportedly has had previous episodes of syncope.  TTE results as noted above.  Less likely that her abnormal MRI brain findings contributed to this.  Driving restrictions advised.?  Cough syncope versus vasovagal.  History of pulmonary embolism Not on anticoagulation.  CTA chest this admission showed no acute pulmonary embolism.  During June 2019 admission, patient had abnormal CT of the abdomen showing abnormalities in the pancreas.  Recommend outpatient follow-up as deemed necessary.  Consultations:  Pulmonology  Procedures:  TTE 06/14/2018: Impressions:  - Normal biventricular function, LVEF 60-65%. Images inadequate for   the assessment of subtle regional wall motion abnormalities,   grossly normal wall motion. No evidence of LVOT obstruction or   systolic anterior motion of the mitral valve. No hemodynamically   significant valvular heart disease. IVC appears underfilled and   collapses easily.   Discharge Instructions  Discharge Instructions    Ambulatory referral to Neurology   Complete by:  As directed    An appointment is requested in approximately: 1 week.   Call MD for:   Complete by:  As directed    Passing out.  Coughing up blood.   Call MD for:  difficulty breathing, headache or visual disturbances   Complete by:  As directed    Call MD for:  extreme fatigue   Complete by:  As directed    Call MD for:  persistant dizziness or light-headedness   Complete by:  As  directed    Call MD for:  persistant nausea and vomiting   Complete by:  As directed    Call MD for:  severe uncontrolled pain   Complete by:  As directed    Call MD for:  temperature >100.4   Complete by:  As directed    Diet - low sodium heart healthy   Complete by:  As directed    Discharge instructions   Complete by:  As directed    1) Excedrin Migraine: Do not take more than 2 tablets in a 24-hour period.   Driving Restrictions   Complete by:  As directed    Do not drive for 6 months or until cleared by your physician to do so during outpatient office visit.   Increase activity slowly   Complete by:  As directed        Medication List    STOP taking these medications   ciprofloxacin 500 MG tablet Commonly known as:  CIPRO     TAKE these medications   allopurinol 100 MG tablet Commonly known as:  ZYLOPRIM Take 1 tablet (100 mg total) by mouth daily. Must keep visit on 11/03/17 to get refills What changed:    when to take this  reasons to take this   amoxicillin-clavulanate 875-125 MG tablet Commonly known as:  AUGMENTIN Take 1 tablet by mouth 2 (two) times daily for 4 days.   aspirin-acetaminophen-caffeine 250-250-65 MG tablet Commonly known as:  EXCEDRIN MIGRAINE Take 1 tablet by mouth every 6 (six) hours as needed for headache or migraine.   diazepam 5 MG tablet Commonly known as:  VALIUM TAKE 1 TABLET BY MOUTH EVERY 12 HOURS IF NEEDED FOR ANXIETY What changed:  See the new instructions.   famotidine 20 MG tablet Commonly known as:  PEPCID Take 1 tablet (20 mg total) by mouth 2 (two) times daily.   feeding supplement (PRO-STAT SUGAR FREE 64) Liqd Take 30 mLs by mouth 2 (two) times daily.   hydrochlorothiazide 25 MG tablet Commonly known as:  HYDRODIURIL Take 25 mg by mouth every morning.   IRON 27 240 (27 FE) MG tablet Generic drug:  ferrous gluconate Take 240 mg by mouth every morning.   MULTIVITAMIN ADULTS PO Take 1 tablet by mouth every  morning.   potassium chloride SA 20 MEQ tablet Commonly known as:  K-DUR,KLOR-CON Take 2 tablets (40 mEq total) by mouth every morning. What changed:  how much to take   sennosides-docusate sodium 8.6-50 MG tablet Commonly known as:  SENOKOT-S Take 1 tablet by mouth daily as needed for constipation.   Vitamin D3 50 MCG (2000 UT) Tabs Take 2,000 Units by mouth every morning.      Follow-up Information    Plotnikov, Georgina Quint, MD. Schedule an appointment as soon as possible for a visit in 3 day(s).   Specialty:  Internal Medicine Why:  To be seen with repeat labs (CBC & BMP). Contact information: [address] [place] [address] [phone]        [facility]. Schedule an appointment as soon as possible for a visit.   Specialty:  Pulmonology Why:  Please call on Monday, 06/18/2018 for a new consultation appointment.  This information was also provided to you by the lung doctor who saw  you. Contact information: [address] [place] [address] [phone]       [facility]. Schedule an appointment as soon as possible for a visit.   Why:  Please call on Monday, 06/18/2018 for a new consultation appointment. Contact information: [address] [place] [address]  Phone: [phone]         Allergies  Allergen Reactions  . Guaifenesin Shortness Of Breath  . Iron Anaphylaxis and Other (See Comments)    IV only  . Codeine Other (See Comments)    tiredness  . Doxepin Hives  . Hydromorphone Hcl Nausea And Vomiting  . Metoprolol Succinate Other (See Comments)    REACTION: edema  . Morphine Other (See Comments)    Does not work; hallucinations and irritable  . Pantoprazole Sodium Other (See Comments)    heartburn  . Pseudoeph-Doxylamine-Dm-Apap Hives  . Pseudoephedrine Hives  . Sulfa Antibiotics Hives  . Prednisone Rash  . Tetracyclines & Related Rash      Procedures/Studies: Dg  Chest 2 View  Result Date: 06/13/2018 CLINICAL DATA:  Syncopal episode. Patient states she was out for 20 minutes and woke up with a pool of blood in her mouth. EXAM: CHEST - 2 VIEW COMPARISON:  06/29/2017 CXR FINDINGS: Patchy opacity at the right lung base suspicious for aspiration. Pulmonary hyperinflation with blunting the costophrenic angles is otherwise noted. No overt pulmonary edema. Heart and mediastinal contours are normal. No acute osseous abnormality is seen IMPRESSION: Patchy airspace opacity at the right lung base, suspicious for aspiration. Electronically Signed   By: Tollie Eth M.D.   On: 06/13/2018 18:37   Ct Angio Chest Pe W And/or Wo Contrast  Result Date: 06/13/2018 CLINICAL DATA:  Syncopal episode 1230 today. EXAM: CT ANGIOGRAPHY CHEST WITH CONTRAST TECHNIQUE: Multidetector CT imaging of the chest was performed using the standard protocol during bolus administration of intravenous contrast. Multiplanar CT image reconstructions and MIPs were obtained to evaluate the vascular anatomy. CONTRAST:  ISOVUE-370 IOPAMIDOL (ISOVUE-370) INJECTION 76% COMPARISON:  None. FINDINGS: Cardiovascular: Normal heart size without pericardial effusion or thickening. No significant coronary arteriosclerosis. Satisfactory opacification of pulmonary arteries without embolus. Nonaneurysmal thoracic aorta dissection. Conventional branch pattern of the great vessels. Mediastinum/Nodes: No enlarged mediastinal, hilar, or axillary lymph nodes. The thyroid gland is normal in size without dominant mass. Trachea is patent. Dilated debris-filled esophagus associated with a moderate-sized hiatal hernia. Lungs/Pleura: Patchy airspace opacities in the right lower lobe outlining what appears be a thin walled pulmonary cyst measures approximately 3.2 x 2 x 0.7 cm. No effusion or pneumothorax. Mild centrilobular emphysema in the upper lobes. Upper Abdomen: No acute abnormality. Musculoskeletal: No chest wall  abnormality. No acute or significant osseous findings. Review of the MIP images confirms the above findings. IMPRESSION: 1. No acute pulmonary embolus. 2. Patchy airspace opacities in the right lower lobe outlining what appears to be a thin walled pulmonary cyst measuring 3.2 x 2 x 0.7 cm. Findings raise concern for pneumonia possibly aspiration related. 3. Moderate-sized hiatal hernia dilated air-filled esophagus containing frothy material within. Emphysema (ICD10-J43.9). Electronically Signed   By: Tollie Eth M.D.   On: 06/13/2018 21:55   Mr Brain Wo Contrast  Result Date: 06/14/2018 CLINICAL DATA:  47 year old female with syncopal episode 12:30 p.m. yesterday. Worst headache of life. EXAM: MRI HEAD WITHOUT CONTRAST TECHNIQUE: Multiplanar, multiecho pulse sequences of the brain and surrounding structures were obtained without intravenous contrast. COMPARISON:  Head CT without contrast  10/17/2005. FINDINGS: Brain: Cerebral volume similar to the 2007 CT with mild chronic ventriculomegaly for age. The temporal horns remain diminutive. No restricted diffusion to suggest acute infarction. No midline shift, mass effect, evidence of mass lesion, extra-axial collection or acute intracranial hemorrhage. Cervicomedullary junction and pituitary are within normal limits. Volume loss in the posterior body of the corpus callosum. Moderate for age patchy nonspecific cerebral white matter T2 and FLAIR hyperintensity, with periventricular involvement on the left. Mild posterior left temporal horn involvement in addition to left periatrial involvement. No cortical encephalomalacia or chronic cerebral blood products. Signal in the deep gray matter nuclei, brainstem, and cerebellum is normal. There is a small volume of left lateral cerebellum herniated into a prominent left occipital arachnoid granulation (series 6, image 7 and series 9, image 14. Vascular: Major intracranial vascular flow voids are preserved with generalized  intracranial artery tortuosity. Dolichoectatic dominant distal left vertebral artery. Additionally cervical carotid arteries appear dolichoectatic and/or aneurysmal bilaterally (right ICA at the C1 level diameter estimated at 9 millimeters on series 4, image 9, and contralateral left ICA at that level up to 11 millimeters. Skull and upper cervical spine: Negative visible cervical spine. Normal bone marrow signal. Sinuses/Orbits: Negative. Other: Mastoids are clear. Visible internal auditory structures appear normal. Scalp and face soft tissues appear negative. IMPRESSION: 1.  No acute intracranial abnormality. 2. 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 Consider fibromuscular dysplasia (FMD) or other connective tissue disease. 3. Chronic nonspecific ventriculomegaly for age appears not significantly changed since 2007. 4. Superimposed moderate nonspecific cerebral white matter signal changes. Associated chronic volume loss in the posterior corpus callosum. Differential considerations include accelerated small vessel ischemia, sequelae of trauma, hypercoagulable state, vasculitis, migraines, prior infection or demyelination. 5. A small volume of the left lateral cerebellum is herniated into an adjacent arachnoid granulation, and usually this is an asymptomatic finding. Electronically Signed   By: Odessa FlemingH  Hall M.D.   On: 06/14/2018 11:59      Subjective: Patient interviewed and examined along with her RN in room.  Patient insistent on being discharged home.  Reports feeling slightly weak but no cough, dyspnea or hemoptysis.  Denies dizziness or lightheadedness even with activity.  As per RN, ambulated halls without hypoxia.  No pain reported.  Discharge Exam:  Vitals:   06/15/18 1403 06/15/18 2042 06/16/18 0537 06/16/18 1230  BP: 123/90 112/87 121/87   Pulse: 89 99 82   Resp: 16 16 16    Temp: (!) 97.4 F (36.3 C)  98.3 F (36.8 C) 97.7 F (36.5 C)   TempSrc: Oral Oral Oral   SpO2: 98% 97% 98% 92%  Weight:      Height:        General: Pleasant young female, moderately built and nourished sitting up comfortably in bed. Cardiovascular: S1 & S2 heard, RRR, S1/S2 +. No murmurs, rubs, gallops or clicks. No JVD or pedal edema. Respiratory: Clear to auscultation without wheezing, rhonchi or crackles. No increased work of breathing. Abdominal:  Non distended, non tender & soft. No organomegaly or masses appreciated. Normal bowel sounds heard. CNS: Alert and oriented. No focal deficits. Extremities: no edema, no cyanosis    The results of significant diagnostics from this hospitalization (including imaging, microbiology, ancillary and laboratory) are listed below for reference.     Microbiology: Recent Results (from the past 240 hour(s))  Culture, blood (routine x 2) Call MD if unable to obtain prior  to antibiotics being given     Status: None (Preliminary result)   Collection Time: 06/13/18 10:56 PM  Result Value Ref Range Status   Specimen Description   Final    BLOOD BLOOD RIGHT HAND Performed at [facility], [address], [place], [place] [address]    Special Requests   Final    BOTTLES DRAWN AEROBIC AND ANAEROBIC Blood Culture adequate volume Performed at [facility], [address], [place], [place] [address]    Culture   Final    NO GROWTH 2 DAYS Performed at [facility], [address], [place], [place] [address]    Report Status PENDING  Incomplete  Culture, blood (routine x 2) Call MD if unable to obtain prior to antibiotics being given     Status: None (Preliminary result)   Collection Time: 06/13/18 10:56 PM  Result Value Ref Range Status   Specimen Description   Final    BLOOD LEFT WRIST Performed at [facility], [address], [place], [place] [address]    Special Requests   Final    BOTTLES DRAWN AEROBIC  AND ANAEROBIC Blood Culture adequate volume Performed at [facility], [address] [place], [place] [address]    Culture   Final    NO GROWTH 2 DAYS Performed at [facility], [address], [place], [place] [address]    Report Status PENDING  Incomplete     Labs: CBC: Recent Labs  Lab 06/13/18 1804 06/13/18 2256 06/14/18 0345 06/15/18 0415 06/16/18 0533  WBC 26.8* 28.8* 21.4* 14.0* 11.3*  HGB 14.5 12.9 11.3* 11.4* 11.0*  HCT 44.0 39.4 35.1* 35.1* 34.7*  MCV 89.8 90.6 90.7 90.0 90.6  PLT 338 323 288 283 288   Basic Metabolic Panel: Recent Labs  Lab 06/13/18 1804 06/14/18 0345 06/15/18 0415 06/16/18 0533  NA 142 141 140 139  K 3.4* 3.2* 3.4* 3.5  CL 105 110 107 105  CO2 22 22 23 23   GLUCOSE 96 111* 84 101*  BUN 19 17 8 9   CREATININE 0.81 0.73 0.65 0.60  CALCIUM 9.5 8.3* 8.2* 8.5*   Cardiac Enzymes: Recent Labs  Lab 06/13/18 2256 06/14/18 0650 06/14/18 1240 06/14/18 1836  TROPONINI 0.44* 0.20* 0.12* 0.08*    Although patient's sister was at bedside, she was asleep and did not wake up during my visit and was unable to update her regarding patient's care.  Patient indicated that she will update her sister when she wakes up.   Time coordinating discharge: 40 minutes  SIGNED:  Marcellus Scott, MD, FACP, [facility]. [facility] Pager [phone]  If 7PM-7AM, please contact night-coverage [email or web address] Password [email or web address] 06/16/2018, 2:21 PM

## 2018-06-18 ENCOUNTER — Telehealth: Payer: Self-pay | Admitting: *Deleted

## 2018-06-18 NOTE — Telephone Encounter (Signed)
Called pt to make hosp f/u appt w/PCP there was no answer LMOM RTC. Sent [facility] CRM fro [employer].Marland Kitchen.Raechel Chute/lmb

## 2018-06-19 LAB — CULTURE, BLOOD (ROUTINE X 2)
Culture: NO GROWTH
Culture: NO GROWTH
Special Requests: ADEQUATE
Special Requests: ADEQUATE

## 2018-06-19 NOTE — Telephone Encounter (Signed)
Pt call back yesterday [facility] made appt for 06/21/18. Called pt this am to verify appt and complete TCM call below.Raechel Chute./lmb  Transition Care Management Follow-up Telephone Call   Date discharged? 06/16/18   How have you been since you were released from the hospital? Pt states she is doing alright just tired   Do you understand why you were in the hospital? YES   Do you understand the discharge instructions? YES   Where were you discharged to? Home   Items Reviewed:  Medications reviewed: YES  Allergies reviewed: YES  Dietary changes reviewed: YES, heart healthy but she states she doesn't have appetite  Referrals reviewed: YES, still waiting on appt w/pulmonologist   Functional Questionnaire:   Activities of Daily Living (ADLs):   She states she are independent in the following: ambulation, bathing and hygiene, feeding, continence, grooming, toileting and dressing States she doesn't require assistance    Any transportation issues/concerns?: NO   Any patient concerns? NO   Confirmed importance and date/time of follow-up visits scheduled YES, appt 06/21/18  Provider Appointment booked with Dr. Posey ReaPlotnikov  Confirmed with patient if condition begins to worsen call PCP or go to the ER.  Patient was given the office number and encouraged to call back with question or concerns.  : YES

## 2018-06-21 ENCOUNTER — Ambulatory Visit (INDEPENDENT_AMBULATORY_CARE_PROVIDER_SITE_OTHER): Payer: 59 | Admitting: Internal Medicine

## 2018-06-21 ENCOUNTER — Encounter: Payer: Self-pay | Admitting: Neurology

## 2018-06-21 ENCOUNTER — Telehealth: Payer: Self-pay | Admitting: Neurology

## 2018-06-21 ENCOUNTER — Ambulatory Visit (INDEPENDENT_AMBULATORY_CARE_PROVIDER_SITE_OTHER): Payer: 59 | Admitting: Neurology

## 2018-06-21 ENCOUNTER — Encounter: Payer: Self-pay | Admitting: Internal Medicine

## 2018-06-21 VITALS — BP 124/79 | HR 97 | Ht 67.5 in | Wt 114.0 lb

## 2018-06-21 VITALS — BP 126/84 | HR 89 | Temp 98.0°F | Ht 67.5 in | Wt 114.0 lb

## 2018-06-21 DIAGNOSIS — E538 Deficiency of other specified B group vitamins: Secondary | ICD-10-CM | POA: Diagnosis not present

## 2018-06-21 DIAGNOSIS — I10 Essential (primary) hypertension: Secondary | ICD-10-CM

## 2018-06-21 DIAGNOSIS — F330 Major depressive disorder, recurrent, mild: Secondary | ICD-10-CM

## 2018-06-21 DIAGNOSIS — J690 Pneumonitis due to inhalation of food and vomit: Secondary | ICD-10-CM

## 2018-06-21 DIAGNOSIS — R55 Syncope and collapse: Secondary | ICD-10-CM

## 2018-06-21 DIAGNOSIS — G43709 Chronic migraine without aura, not intractable, without status migrainosus: Secondary | ICD-10-CM | POA: Diagnosis not present

## 2018-06-21 DIAGNOSIS — B9689 Other specified bacterial agents as the cause of diseases classified elsewhere: Secondary | ICD-10-CM | POA: Insufficient documentation

## 2018-06-21 DIAGNOSIS — N760 Acute vaginitis: Secondary | ICD-10-CM

## 2018-06-21 MED ORDER — METRONIDAZOLE 500 MG PO TABS: 500.0000 mg | ORAL_TABLET | Freq: Two times a day (BID) | ORAL | 2 refills | Status: DC

## 2018-06-21 NOTE — Assessment & Plan Note (Addendum)
Passed out on Christmas day - hit mouth, bled on the floor  Per Dr Anne Hahn:  1.  History of vasovagal syncope, recurrent syncope  2.  Recent episode of hemoptysis and syncope, pneumonia  3.  Arterial tortuosity, possible fibromuscular dysplasia  The patient does have unusual features on her clinical examination with long spindly fingers, tall stature.  The patient could potentially have a connective tissue disorder such as Marfan syndrome or Ehlers-Danlos syndrome.  The patient will be set up for CT angiogram of the head and neck.  The patient will follow-up through this office if needed.

## 2018-06-21 NOTE — Progress Notes (Signed)
Reason for visit: Syncope, abnormal MRI brain  Referring physician:   [facility]  [name] is a 48 y.o. female  History of present illness:  [name] is a 48 year old right-handed white female with a history of recurring syncope throughout her life.  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  The patient has had episodes of hemoptysis and syncope in the past associated with chronic bronchitis issues.  The patient had been feeling fatigued and had general malaise for about a month prior to her admission to the hospital on 13 June 2018.  The patient was at her computer at that time, she apparently lost consciousness suddenly, when she woke up she had coughed up a considerable amount of blood, she felt weak all over.  She was quite diaphoretic.  She went to the emergency room was found to have a white blood count of almost 30,000, she was found to have a pneumonia that required IV antibiotic therapy and then eventually was converted to oral antibiotics.  The patient has since been discharged from the hospital.  The patient has no reported focal numbness or weakness of the face, arms, legs.  She denies any headache but she does have a history of migraine.  She denies any vision changes, she denies issues controlling the bowels or the bladder but she does have some chronic constipation issues.  The patient had MRI of the brain done in the hospital that showed some mild ventriculomegaly, she has tortuosity of the arterial circulation and the possibility of fibromuscular dysplasia was entertained, for this reason, the patient is sent to this office.  A 2D echocardiogram was done in the hospital and did not show evidence of valve disease, a CT angiogram of the chest did not show evidence of a pulmonary embolism.  The patient does have a prior history of DVT, she was on Coumadin transiently.  Past Medical History:  Diagnosis Date  . Allergic rhinitis     . Anxiety   . B12 deficiency   . Barrett's esophagus   . Chronic constipation   . 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 Depression   . GERD (gastroesophageal reflux disease)   . Hiatal hernia   . History of DVT of lower extremity    08-14-2003  RIGHT LOWER EXTREMITY-- TREATED W/ COUMADIN----  per pt none since  . History of palpitations   . Hypertension   . Idiopathic chronic gout    12-20-2017  per pt stable ,   last episode 02/ 2019  . Iron deficiency anemia   . Migraines   . Personal history of PE (pulmonary embolism)    03/ 2005 RIGHT SIDE  --- treated w/ coumadin---  per pt none since  . PONV (postoperative nausea and vomiting)   . Tinea versicolor   . Vitamin D deficiency     Past Surgical History:  Procedure Laterality Date  . BIOPSY  11/29/2017   Procedure: BIOPSY;  Surgeon: Vida RiggerMagod, Marc, MD;  Location: [facility];  Service: Endoscopy;;  . BRONCHOSCOPY  05-20-2004  dr clance  . ESOPHAGOGASTRODUODENOSCOPY (EGD) WITH PROPOFOL N/A 11/29/2017   Procedure: ESOPHAGOGASTRODUODENOSCOPY (EGD) WITH PROPOFOL;  Surgeon: Vida RiggerMagod, Marc, MD;  Location: [facility];  Service: Endoscopy;  Laterality: N/A;  . EXPLORATORY LAPAROTOMY  AGE 51    [facility]   for constipation  . INSERTION OF MESH N/A 12/26/2017   Procedure: INSERTION OF  BIO A MESH;  Surgeon: [name], MD;  Location: [facility];  Service: General;  Laterality: N/A;  . KNEE ARTHROSCOPY W/ ACL RECONSTRUCTION Right 09/2003  . LAPAROSCOPY BILATERAL TUBAL FULGERATION AND ATTEMPTED THERMACHOICE ABLATION WITH UTERINE PERFORATION  07-17-2009   dr [name]  [facility]  . LUNG LOBECTOMY  1991   right lower lobe for massive hemoptyosis  . MINI-THORACOTOMY WEDGE RESECTION OF LEFT LOWER LOBE CYST Left 06-24-2004   dr Edwyna Shell [facility]   hemoptyosis    Family History  Problem Relation Age of Onset  . Hypertension Other   . Diabetes Other     Social history:  reports that she has never smoked. She has never  used smokeless tobacco. She reports current alcohol use. She reports that she does not use drugs.  Medications:  Prior to Admission medications   Medication Sig Start Date End Date Taking? Authorizing Provider  allopurinol (ZYLOPRIM) 100 MG tablet Take 1 tablet (100 mg total) by mouth daily. Must keep visit on 11/03/17 to get refills Patient taking differently: Take 100 mg by mouth daily as needed (for gout flare up). Must keep visit on 11/03/17 to get refills 11/03/17   Plotnikov, Georgina Quint, MD  Amino Acids-Protein Hydrolys (FEEDING SUPPLEMENT, PRO-STAT SUGAR FREE 64,) LIQD Take 30 mLs by mouth 2 (two) times daily. 06/16/18   Elease Etienne, MD  aspirin-acetaminophen-caffeine (EXCEDRIN MIGRAINE) [phone] MG tablet Take 1 tablet by mouth every 6 (six) hours as needed for headache or migraine.     [provider]  Cholecalciferol (VITAMIN D3) 2000 units TABS Take 2,000 Units by mouth every morning.     [provider]  diazepam (VALIUM) 5 MG tablet TAKE 1 TABLET BY MOUTH EVERY 12 HOURS IF NEEDED FOR ANXIETY Patient taking differently: Take 5 mg by mouth every 12 (twelve) hours as needed for anxiety. take 1 tablet by mouth every 12 hours if needed for anxiety 05/16/18   Plotnikov, Georgina Quint, MD  famotidine (PEPCID) 20 MG tablet Take 1 tablet (20 mg total) by mouth 2 (two) times daily. 06/16/18   Hongalgi, Maximino Greenland, MD  ferrous gluconate (IRON 27) 240 (27 FE) MG tablet Take 240 mg by mouth every morning.     [provider]  hydrochlorothiazide (HYDRODIURIL) 25 MG tablet Take 25 mg by mouth every morning.     [provider]  Multiple Vitamins-Minerals (MULTIVITAMIN ADULTS PO) Take 1 tablet by mouth every morning.     [provider]  potassium chloride SA (K-DUR,KLOR-CON) 20 MEQ tablet Take 2 tablets (40 mEq total) by mouth every morning. 06/16/18   Hongalgi, Maximino Greenland, MD  sennosides-docusate sodium (SENOKOT-S) 8.6-50 MG tablet Take 1 tablet by mouth  daily as needed for constipation.    [provider]      Allergies  Allergen Reactions  . Guaifenesin Shortness Of Breath  . Iron Anaphylaxis and Other (See Comments)    IV only  . Codeine Other (See Comments)    tiredness  . Doxepin Hives  . Hydromorphone Hcl Nausea And Vomiting  . Metoprolol Succinate Other (See Comments)    REACTION: edema  . Morphine Other (See Comments)    Does not work; hallucinations and irritable  . Pantoprazole Sodium Other (See Comments)    heartburn  . Pseudoeph-Doxylamine-Dm-Apap Hives  . Pseudoephedrine Hives  . Sulfa Antibiotics Hives  . Prednisone Rash  . Tetracyclines & Related Rash    ROS:  Out of a complete 14 system review of symptoms, the patient  complains only of the following symptoms, and all other reviewed systems are negative.  Fatigue Constipation Easy bruising Passing out Anxiety, decreased energy, disinterest in activities  Blood pressure 124/79, pulse 97, height 5' 7.5" (1.715 m), weight 114 lb (51.7 kg), last menstrual period 05/22/2018.  Physical Exam  General: The patient is alert and cooperative at the time of the examination.  Eyes: Pupils are equal, round, and reactive to light. Discs are flat bilaterally.  Neck: The neck is supple, no carotid bruits are noted.  Respiratory: The respiratory examination is clear.  Cardiovascular: The cardiovascular examination reveals a regular rate and rhythm, no obvious murmurs or rubs are noted.  Skin: Extremities are without significant edema.  Neurologic Exam  Mental status: The patient is alert and oriented x 3 at the time of the examination. The patient has apparent normal recent and remote memory, with an apparently normal attention span and concentration ability.  Cranial nerves: Facial symmetry is present. There is good sensation of the face to pinprick and soft touch bilaterally. The strength of the facial muscles and the muscles to head turning and  shoulder shrug are normal bilaterally. Speech is well enunciated, no aphasia or dysarthria is noted. Extraocular movements are full. Visual fields are full. The tongue is midline, and the patient has symmetric elevation of the soft palate. No obvious hearing deficits are noted.  Motor: The motor testing reveals 5 over 5 strength of all 4 extremities. Good symmetric motor tone is noted throughout.  Sensory: Sensory testing is intact to pinprick, soft touch, vibration sensation, and position sense on all 4 extremities. No evidence of extinction is noted.  Coordination: Cerebellar testing reveals good finger-nose-finger and heel-to-shin bilaterally.  Gait and station: Gait is normal. Tandem gait is normal. Romberg is negative. No drift is seen.  Reflexes: Deep tendon reflexes are symmetric and normal bilaterally. Toes are downgoing bilaterally.    MRI brain 06/14/18:  IMPRESSION: 1.  No acute intracranial abnormality.  2. 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 Consider fibromuscular dysplasia (FMD) or other connective tissue disease.  3. Chronic nonspecific ventriculomegaly for age appears not significantly changed since 2007.  4. Superimposed moderate nonspecific cerebral white matter signal changes. Associated chronic volume loss in the posterior corpus callosum. Differential considerations include accelerated small vessel ischemia, sequelae of trauma, hypercoagulable state, vasculitis, migraines, prior infection or demyelination.  5. A small volume of the left lateral cerebellum is herniated into an adjacent arachnoid granulation, and usually this is an asymptomatic finding.  * MRI scan images were reviewed online. I agree with the written report.   2D Echo 06/14/18:  Study Conclusions  - Left ventricle: The cavity size was normal. There was mild focal   basal  hypertrophy of the septum. Systolic function was normal.   The estimated ejection fraction was in the range of 60% to 65%.   Images were inadequate for LV wall motion assessment. Left   ventricular diastolic function parameters were normal. No   definite LVOT flow acceleration or evidence of obstruction. No   systolic anterior motion detected. Imaging suboptimal for   detection of mid-cavitary gradients. - Aortic valve: Transvalvular velocity was within the normal range.   There was no stenosis. There was no regurgitation. - Mitral valve: Transvalvular velocity was within the normal range.   There was no evidence for stenosis. There was trivial   regurgitation. - Left atrium: The atrium was normal  in size. - Right ventricle: The cavity size was normal. Wall thickness was   normal. Systolic function was normal. - Right atrium: The atrium was normal in size. - Atrial septum: No defect or patent foramen ovale was identified. - Tricuspid valve: There was trivial regurgitation. - Inferior vena cava: The vessel was normal in size. The   respirophasic diameter changes were in the normal range (= 50%),   consistent with normal central venous pressure. - Pericardium, extracardiac: There was no pericardial effusion.  Impressions:  - Normal biventricular function, LVEF 60-65%. Images inadequate for   the assessment of subtle regional wall motion abnormalities,   grossly normal wall motion. No evidence of LVOT obstruction or   systolic anterior motion of the mitral valve. No hemodynamically   significant valvular heart disease. IVC appears underfilled and   collapses easily.    Assessment/Plan:  1.  History of vasovagal syncope, recurrent syncope  2.  Recent episode of hemoptysis and syncope, pneumonia  3.  Arterial tortuosity, possible fibromuscular dysplasia  The patient does have unusual features on her clinical examination with long spindly fingers, and a tall stature.  The  patient could potentially have a connective tissue disorder such as Marfan syndrome or Ehlers-Danlos syndrome.  The patient will be set up for CT angiogram of the head and neck.  The patient will follow-up through this office if needed.  Marlan Palau MD 06/21/2018 9:49 AM  [facility] [address] [place], [place] [address]  Phone [phone] Fax [phone]

## 2018-06-21 NOTE — Assessment & Plan Note (Signed)
Celexa - low dose - stopped due to HAs

## 2018-06-21 NOTE — Progress Notes (Signed)
Subjective:  Patient ID: [name], female    DOB: [birth year]  Age: 48 y.o. MRN: [id number]  CC: No chief complaint on file.   HPI [name] presents for a post-hosp f/u. She was d/c'd on 12/28 - aspiration pneumonia, syncope. C/o BV recurrent sx's. Per d/c:   "Principal Problem: AspirationCAP (community acquired pneumonia) associated with hemoptysis and syncope Patient had a known syncopal event with significant hemoptysis.CT scan personally reviewed and shows a significant right-sided infiltrate around a large cystic area as pictured below. Placed on empiric Rocephin and azithromycin. Azithromycin discontinued as this is not likely an atypical pneumonia, more likely from aspiration related to syncope.  Urine Legionella and pneumococcal antigen negative.  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 Tachycardia improved and remains afebrile. Leukocytosis has almost resolved. Has had a 3 g drop in her hemoglobin, but suspect this is partially dilutional, no further drop in hemoglobin has been stable in the 11 g range for the last 3 days.She was anxious about her diagnosis and implications/long-term prognosis but has been reassured by the pulmonologist and the hospitalist team.  PT has evaluated and has no outpatient needs.  RN has ambulated patient and she is not hypoxic.  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  There is no need for any interventions at this point but will need a repeat CT in 3 to 6 months to evaluate growth of the cyst and follow-up with pulmonary as outpatient.  Hemoptysis has resolved without recurrence.  Patient  reportedly has had previous episodes of hemoptysis when patient developed some lung infection.  Low index of suspicion for pulmonary TB.    Active Problems: Troponin elevation likely from demand ischemia Likely demand ischemia in the setting of tachycardia, syncopal event and acute infection. Troponins havetrended down. No aspirin given her significant history of hemoptysis. 2D echocardiogramdone 06/14/2018: EF 60-65% but images were inadequate for LV wall motion assessment.  Dysphasia/moderate malnutrition Evaluated by dietitian found to have moderate malnutrition related to chronic illness evidenced by moderate muscle depletion. Continue Protostat and boost supplementation.  Although speech therapy consultation was requested, this has not yet occurred.  As per discussion with patient and her RN, she has had no difficulty swallowing or concerns for aspiration.  Patient also insistent on being discharged today and speech therapy swallow evaluation can be obtained as outpatient.  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 Suspect underlying fibromuscular dysplasia.Will need need further evaluation, particularly with a history of multiple pulmonary cysts.  Pulmonology evaluated patient on 12/28 and they discussed the MRI brain findings with neurology and neurology indicated that there are multiple findings in the MRI that are concerning in the nonacute setting and that follow-up as outpatient with neurology is recommended.  They did not feel it necessary to see patient in the inpatient setting.  Again pulmonology provided patient with contact details for [facility]  follow-up.  I discussed with the consulting pulmonologist today.  Hypokalemia Replaced.  Likely related to HCTZ.  Potassium dose was increased at discharge.  Follow BMP closely as outpatient.  Essential hypertension Continue hydrochlorothiazide.  Controlled.  Barrett's esophagus S/P Nissen fundoplication (without gastrostomy tube) procedure Continue Pepcid.  Gout Patient reports a flare in her gout overnight 2 nights ago. Given a dose of colchicine.  Resolved.Resumed allopurinol.  Syncope Without prodrome.  Reportedly has had previous episodes of syncope.  TTE results as noted above.  Less likely that her abnormal MRI brain findings contributed to this.  Driving restrictions advised.?  Cough syncope versus vasovagal.  History of pulmonary embolism Not on anticoagulation.  CTA chest this admission showed no acute pulmonary embolism.  During June 2019 admission, patient had abnormal CT of the abdomen showing abnormalities in the pancreas.  Recommend outpatient follow-up as deemed necessary.  Consultations:  Pulmonology  Procedures:  TTE 06/14/2018: Impressions:  - Normal biventricular function, LVEF 60-65%. Images inadequate for the assessment of subtle regional wall motion abnormalities, grossly normal wall motion. No evidence of LVOT obstruction or systolic anterior motion of the mitral valve. No hemodynamically significant valvular heart disease. IVC appears underfilled and collapses easily."   Outpatient Medications Prior to Visit  Medication Sig Dispense Refill  . allopurinol (ZYLOPRIM) 100 MG tablet Take 1 tablet (100 mg total) by mouth daily. Must keep visit on 11/03/17 to get refills (Patient taking differently: Take 100 mg by mouth daily as needed (for gout flare up). Must keep visit on 11/03/17 to get refills) 90 tablet 3  . aspirin-acetaminophen-caffeine (EXCEDRIN MIGRAINE) 250-250-65 MG tablet Take 1 tablet by mouth every 6 (six)  hours as needed for headache or migraine.     . Cholecalciferol (VITAMIN D3) 2000 units TABS Take 2,000 Units by mouth every morning.     . diazepam (VALIUM) 5 MG tablet TAKE 1 TABLET BY MOUTH EVERY 12 HOURS IF NEEDED FOR ANXIETY (Patient taking differently: Take 5 mg by mouth every 12 (twelve) hours as needed for anxiety. take 1 tablet by mouth every 12 hours if needed for anxiety) 60 tablet 0  . ferrous gluconate (IRON 27) 240 (27 FE) MG tablet Take 240 mg by mouth every morning.     . hydrochlorothiazide (HYDRODIURIL) 25 MG tablet Take 25 mg by mouth every morning.     . Multiple Vitamins-Minerals (MULTIVITAMIN ADULTS PO) Take 1 tablet by mouth every morning.     . potassium chloride SA (K-DUR,KLOR-CON) 20 MEQ tablet Take 2 tablets (40 mEq total) by mouth every morning. 60 tablet 0  . sennosides-docusate sodium (SENOKOT-S) 8.6-50 MG tablet Take 1 tablet by mouth daily as needed for constipation.     No facility-administered medications prior to visit.     ROS: Review of Systems  Objective:  BP 126/84 (BP Location: Right Arm, Patient Position: Sitting, Cuff Size: Normal)   Pulse 89   Temp 98 F (36.7 C) (Oral)   Ht 5' 7.5" (1.715 m)   Wt 114 lb (51.7 kg)   LMP 05/22/2018   SpO2 97%   BMI 17.59 kg/m   BP Readings from Last 3 Encounters:  06/21/18 126/84  06/21/18 124/79  06/16/18 121/87    Wt Readings from Last 3 Encounters:  06/21/18 114 lb (51.7 kg)  06/21/18 114 lb (51.7 kg)  06/13/18 110 lb 4.8 oz (50 kg)    Physical Exam  Lab Results  Component Value Date   WBC 11.3 (H) 06/16/2018   HGB 11.0 (L) 06/16/2018   HCT 34.7 (L) 06/16/2018   PLT 288 06/16/2018   GLUCOSE 101 (H)  06/16/2018   CHOL 222 (H) 11/28/2017   TRIG 164 (H) 11/28/2017   HDL 37 (L) 11/28/2017   LDLDIRECT 173.9 04/09/2009   LDLCALC 152 (H) 11/28/2017   ALT 17 11/29/2017   AST 20 11/29/2017   NA 139 06/16/2018   K 3.5 06/16/2018   CL 105 06/16/2018   CREATININE 0.60 06/16/2018   BUN 9  06/16/2018   CO2 23 06/16/2018   TSH 3.54 09/15/2015   INR 1.02 02/16/2013    Dg Chest 2 View  Result Date: 06/13/2018 CLINICAL DATA:  Syncopal episode. Patient states she was out for 20 minutes and woke up with a pool of blood in her mouth. EXAM: CHEST - 2 VIEW COMPARISON:  06/29/2017 CXR FINDINGS: Patchy opacity at the right lung base suspicious for aspiration. Pulmonary hyperinflation with blunting the costophrenic angles is otherwise noted. No overt pulmonary edema. Heart and mediastinal contours are normal. No acute osseous abnormality is seen IMPRESSION: Patchy airspace opacity at the right lung base, suspicious for aspiration. Electronically Signed   By: Tollie Ethavid  Kwon M.D.   On: 06/13/2018 18:37   Ct Angio Chest Pe W And/or Wo Contrast  Result Date: 06/13/2018 CLINICAL DATA:  Syncopal episode 1230 today. EXAM: CT ANGIOGRAPHY CHEST WITH CONTRAST TECHNIQUE: Multidetector CT imaging of the chest was performed using the standard protocol during bolus administration of intravenous contrast. Multiplanar CT image reconstructions and MIPs were obtained to evaluate the vascular anatomy. CONTRAST:  100mL ISOVUE-370 IOPAMIDOL (ISOVUE-370) INJECTION 76% COMPARISON:  None. FINDINGS: Cardiovascular: Normal heart size without pericardial effusion or thickening. No significant coronary arteriosclerosis. Satisfactory opacification of pulmonary arteries without embolus. Nonaneurysmal thoracic aorta dissection. Conventional branch pattern of the great vessels. Mediastinum/Nodes: No enlarged mediastinal, hilar, or axillary lymph nodes. The thyroid gland is normal in size without dominant mass. Trachea is patent. Dilated debris-filled esophagus associated with a moderate-sized hiatal hernia. Lungs/Pleura: Patchy airspace opacities in the right lower lobe outlining what appears be a thin walled pulmonary cyst measures approximately 3.2 x 2 x 0.7 cm. No effusion or pneumothorax. Mild centrilobular emphysema in the upper  lobes. Upper Abdomen: No acute abnormality. Musculoskeletal: No chest wall abnormality. No acute or significant osseous findings. Review of the MIP images confirms the above findings. IMPRESSION: 1. No acute pulmonary embolus. 2. Patchy airspace opacities in the right lower lobe outlining what appears to be a thin walled pulmonary cyst measuring 3.2 x 2 x 0.7 cm. Findings raise concern for pneumonia possibly aspiration related. 3. Moderate-sized hiatal hernia dilated air-filled esophagus containing frothy material within. Emphysema (ICD10-J43.9). Electronically Signed   By: Tollie Ethavid  Kwon M.D.   On: 06/13/2018 21:55   Mr Brain Wo Contrast  Result Date: 06/14/2018 CLINICAL DATA:  48 year old female with syncopal episode 12:30 p.m. yesterday. Worst headache of life. EXAM: MRI HEAD WITHOUT CONTRAST TECHNIQUE: Multiplanar, multiecho pulse sequences of the brain and surrounding structures were obtained without intravenous contrast. COMPARISON:  Head CT without contrast 10/17/2005. FINDINGS: Brain: Cerebral volume similar to the 2007 CT with mild chronic ventriculomegaly for age. The temporal horns remain diminutive. No restricted diffusion to suggest acute infarction. No midline shift, mass effect, evidence of mass lesion, extra-axial collection or acute intracranial hemorrhage. Cervicomedullary junction and pituitary are within normal limits. Volume loss in the posterior body of the corpus callosum. Moderate for age patchy nonspecific cerebral white matter T2 and FLAIR hyperintensity, with periventricular involvement on the left. Mild posterior left temporal horn involvement in addition to left periatrial involvement. No cortical encephalomalacia or chronic cerebral  blood products. Signal in the deep gray matter nuclei, brainstem, and cerebellum is normal. There is a small volume of left lateral cerebellum herniated into a prominent left occipital arachnoid granulation (series 6, image 7 and series 9, image 14.  Vascular: Major intracranial vascular flow voids are preserved with generalized intracranial artery tortuosity. Dolichoectatic dominant distal left vertebral artery. Additionally cervical carotid arteries appear dolichoectatic and/or aneurysmal bilaterally (right ICA at the C1 level diameter estimated at 9 millimeters on series 4, image 9, and contralateral left ICA at that level up to 11 millimeters. Skull and upper cervical spine: Negative visible cervical spine. Normal bone marrow signal. Sinuses/Orbits: Negative. Other: Mastoids are clear. Visible internal auditory structures appear normal. Scalp and face soft tissues appear negative. IMPRESSION: 1.  No acute intracranial abnormality. 2. 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 Consider fibromuscular dysplasia (FMD) or other connective tissue disease. 3. Chronic nonspecific ventriculomegaly for age appears not significantly changed since 2007. 4. Superimposed moderate nonspecific cerebral white matter signal changes. Associated chronic volume loss in the posterior corpus callosum. Differential considerations include accelerated small vessel ischemia, sequelae of trauma, hypercoagulable state, vasculitis, migraines, prior infection or demyelination. 5. A small volume of the left lateral cerebellum is herniated into an adjacent arachnoid granulation, and usually this is an asymptomatic finding. Electronically Signed   By: Odessa FlemingH  Hall M.D.   On: 06/14/2018 11:59    Assessment & Plan:   There are no diagnoses linked to this encounter.   No orders of the defined types were placed in this encounter.    Follow-up: No follow-ups on file.  Sonda PrimesAlex Ashan Cueva, MD

## 2018-06-21 NOTE — Assessment & Plan Note (Signed)
On B12 

## 2018-06-21 NOTE — Assessment & Plan Note (Addendum)
BP Readings from Last 3 Encounters:  06/21/18 126/84  06/21/18 124/79  06/16/18 121/87   HCTZ

## 2018-06-21 NOTE — Assessment & Plan Note (Addendum)
Recovering slow Finished the abx To work on 1/13 if ok

## 2018-06-21 NOTE — Telephone Encounter (Signed)
Aenta pending faxed clinical notes

## 2018-06-21 NOTE — Assessment & Plan Note (Signed)
Recurrent Flagyl 500 mg bid x 5 d prn

## 2018-06-21 NOTE — Patient Instructions (Addendum)
To work on 1/13 if ok  No driving

## 2018-06-22 NOTE — Telephone Encounter (Signed)
I called [employer] to check the status on the case they did receive my fax and it is still pending.

## 2018-06-25 NOTE — Telephone Encounter (Signed)
Drucie Opitz: [id number] (exp. 06/25/18 to 12/22/18) order sent to GI. lvm for pt to be aware I gave her GI phone number of [phone] and to give them a call if she has not heard from them in the next 2-3 business days.

## 2018-06-26 ENCOUNTER — Telehealth: Payer: Self-pay | Admitting: Internal Medicine

## 2018-06-26 ENCOUNTER — Telehealth: Payer: Self-pay | Admitting: Neurology

## 2018-06-26 NOTE — Telephone Encounter (Signed)
Fwd to medical records to verify if forms have been received. MB RN

## 2018-06-26 NOTE — Telephone Encounter (Signed)
Patient calling to see if we have received FMLA paper work from [place] . Telephone [phone] Fax [phone].  [name] is waiting on  FMLA  Paper work . Patient is accepted back to work on 07/02/2018  Please put claim  number on paper work  # [id number].  Please call patient. [phone].

## 2018-06-26 NOTE — Telephone Encounter (Signed)
Copied from CRM [id number]. Topic: Quick Communication - See Telephone Encounter >> Jun 26, 2018  4:37 PM Lorrine Kin, NT wrote: CRM for notification. See Telephone encounter for: 06/26/18. Patient calling and states that Rosann Auerbach has faxed over FLMA and short term disability paperwork. States that they are needing OV notes and medical request form from 06/21/2018. Patient states that she has to return to work on Monday and they need this by then, so she can work. Please advise.  CB#: [phone] Fax# for [employer]: [phone] Claim #: [id number]

## 2018-06-27 NOTE — Telephone Encounter (Signed)
I contacted the pt and left a vm, (ok per pt's last message) advising we received the paper work this morning (06/27/18). Pt advised paper work has been given to Dr. Anne HahnWillis to review/sign if appropriate. Pt advised we would call back once Dr. Anne HahnWillis has been able to review forms. Pt advised to call back with further questions/concerns. MB RN

## 2018-06-27 NOTE — Telephone Encounter (Signed)
Sending to medical records.

## 2018-06-27 NOTE — Telephone Encounter (Signed)
Spoke with patient. Rosann Auerbach is needing OV notes from Dr.Plotnikov. The form has been signed to medical records.

## 2018-06-27 NOTE — Telephone Encounter (Signed)
Pt calling to check the status of her paperwork that needs to faxed to [place]. Pt would like a call back today, as she is anxious about this because she could lose her job. Pt states ok to please leave a message.

## 2018-06-28 ENCOUNTER — Telehealth: Payer: Self-pay | Admitting: *Deleted

## 2018-06-28 NOTE — Telephone Encounter (Signed)
I faxed pt form back to [employer] on 06/28/18 @ [phone]

## 2018-06-28 NOTE — Telephone Encounter (Signed)
Disability paper work from [employer] completed/signed by Dr. Anne Hahn. Forms fwd back to medical records to send out. Attempted to reach pt and advise of completion, but was unable to reach the pt. If pt calls back please advise forms have been sent/completed.

## 2018-06-29 ENCOUNTER — Ambulatory Visit
Admission: RE | Admit: 2018-06-29 | Discharge: 2018-06-29 | Disposition: A | Discharge status: Home / Self Care | Payer: 59 | Source: Ambulatory Visit | Attending: Neurology | Admitting: Neurology

## 2018-06-29 DIAGNOSIS — R55 Syncope and collapse: Secondary | ICD-10-CM

## 2018-06-29 MED ORDER — IOPAMIDOL (ISOVUE-370) INJECTION 76%
100.0000 mL | Freq: Once | INTRAVENOUS | Status: AC | PRN
  Administered 2018-06-29: 100 mL via INTRAVENOUS

## 2018-06-29 MED ORDER — IOPAMIDOL (ISOVUE-370) INJECTION 76%: 100.0000 mL | Freq: Once | INTRAVENOUS | Status: DC | PRN

## 2018-07-01 ENCOUNTER — Telehealth: Payer: Self-pay | Admitting: Neurology

## 2018-07-01 NOTE — Telephone Encounter (Signed)
I called the patient. 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 ? If patient has a connective tissue disease such as Marfans.    Would recommend a cerebral angiogram, possible ablation of the aneurysm. The patient is to call our office next week to discuss.   CTA head and neck 06/29/18:  IMPRESSION: 1. 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 Findings may represent fibromuscular dysplasia or connective tissue disorder. 2. Patent carotid and vertebral arteries. No dissection or hemodynamically significant stenosis utilizing NASCET criteria. 3. Patent anterior and posterior intracranial circulation. No large vessel occlusion, focal aneurysm, or significant stenosis.

## 2018-07-04 ENCOUNTER — Telehealth: Payer: Self-pay

## 2018-07-04 NOTE — Telephone Encounter (Signed)
Received a Skype message at 10:15 am from [facility] with Dr. Posey Rea office  requesting I call her at # [phone]. I contacted Tammy and was advised the pt's insurance company is in need of Short Term Disability forms. Tammy stated the pt advised her Dr. Anne Hahn had completed her FMLA paper work along with Short term disability.  I advised Dr. Anne Hahn had completed FMLA paper work on 06/28/18 and we faxed the information back to [facility] fax # [phone], but we had not received short term disability paper work. Tammy stated she would fax over the short term disability paper work for me to review advise on how to proceed. Tammy also advised I could contact Bonita Quin # [phone]) medical records representative to further discuss this information.  At 1243 pm I contacted Bonita Quin at number provided. After discussing the forms with Bonita Quin she stated Dr. Posey Rea saw the pt on 06/21/18 and recommended the pt return to work on 07/02/18(see office note from 06/21/18).   I discussed information with Dr. Anne Hahn verbally at 1500 and advised I was going to fwd the forms back to Dr. Posey Rea to completed since he was the provider recommending the pt to return to work on 07/02/18. Dr. Patrecia Pace was agreeable.  I contacted Tammy at 1525 and left a vm on # [phone] advising I would fwd the forms back to PCP to complete. Advised Tammy to call back if she had any further questions.   At 1527 I contacted the pt and left a vm (ok per DPR) advising Dr. Anne Hahn completed the FMLA paper work on 06/28/18 per her request and we faxed forms to [facility].I advised in the FMLA paper work Dr. Anne Hahn did not state she had any restrictions returning to work and did not state she needed to stay out of work and therefore, we cannot fill out short term disability paperwork. Pt also advised I would be fwd short term disability work back to PCP since Dr. Posey Rea was the prescriber recommending the pt return to work on 07/02/18. I advised if the pt had  questions/concerns about the FMLA paper work to return my call and I would be happy to speak with her.   I faxed the forms to # [phone] and received confirmation.

## 2018-07-06 NOTE — Telephone Encounter (Signed)
We called [employer] and I faxed to them. We did not seem to get all the forms. But what we did have was completed and faxed.

## 2018-07-10 DIAGNOSIS — Z0279 Encounter for issue of other medical certificate: Secondary | ICD-10-CM

## 2018-07-11 ENCOUNTER — Emergency Department (HOSPITAL_COMMUNITY)
Admission: EM | Admit: 2018-07-11 | Discharge: 2018-07-11 | Disposition: A | Discharge status: Home / Self Care | Payer: 59 | Attending: Emergency Medicine | Admitting: Emergency Medicine

## 2018-07-11 ENCOUNTER — Other Ambulatory Visit: Payer: Self-pay

## 2018-07-11 ENCOUNTER — Emergency Department (HOSPITAL_COMMUNITY): Payer: 59

## 2018-07-11 DIAGNOSIS — I10 Essential (primary) hypertension: Secondary | ICD-10-CM | POA: Insufficient documentation

## 2018-07-11 DIAGNOSIS — R042 Hemoptysis: Secondary | ICD-10-CM | POA: Diagnosis not present

## 2018-07-11 DIAGNOSIS — R55 Syncope and collapse: Secondary | ICD-10-CM | POA: Diagnosis present

## 2018-07-11 DIAGNOSIS — Z79899 Other long term (current) drug therapy: Secondary | ICD-10-CM | POA: Insufficient documentation

## 2018-07-11 DIAGNOSIS — E860 Dehydration: Secondary | ICD-10-CM

## 2018-07-11 LAB — CBC WITH DIFFERENTIAL/PLATELET
Abs Immature Granulocytes: 0.07 10*3/uL (ref 0.00–0.07)
Basophils Absolute: 0 10*3/uL (ref 0.0–0.1)
Basophils Relative: 0 %
EOS ABS: 0 10*3/uL (ref 0.0–0.5)
Eosinophils Relative: 0 %
HEMATOCRIT: 42.8 % (ref 36.0–46.0)
Hemoglobin: 14.2 g/dL (ref 12.0–15.0)
Immature Granulocytes: 0 %
Lymphocytes Relative: 7 %
Lymphs Abs: 1.1 10*3/uL (ref 0.7–4.0)
MCH: 29.7 pg (ref 26.0–34.0)
MCHC: 33.2 g/dL (ref 30.0–36.0)
MCV: 89.5 fL (ref 80.0–100.0)
Monocytes Absolute: 0.8 10*3/uL (ref 0.1–1.0)
Monocytes Relative: 5 %
Neutro Abs: 13.8 10*3/uL — ABNORMAL HIGH (ref 1.7–7.7)
Neutrophils Relative %: 88 %
Platelets: 332 10*3/uL (ref 150–400)
RBC: 4.78 MIL/uL (ref 3.87–5.11)
RDW: 13.2 % (ref 11.5–15.5)
WBC: 15.8 10*3/uL — ABNORMAL HIGH (ref 4.0–10.5)
nRBC: 0 % (ref 0.0–0.2)

## 2018-07-11 LAB — COMPREHENSIVE METABOLIC PANEL
ALT: 13 U/L (ref 0–44)
AST: 45 U/L — AB (ref 15–41)
Albumin: 4.3 g/dL (ref 3.5–5.0)
Alkaline Phosphatase: 49 U/L (ref 38–126)
Anion gap: 16 — ABNORMAL HIGH (ref 5–15)
BUN: 13 mg/dL (ref 6–20)
CO2: 20 mmol/L — ABNORMAL LOW (ref 22–32)
Calcium: 9.8 mg/dL (ref 8.9–10.3)
Chloride: 107 mmol/L (ref 98–111)
Creatinine, Ser: 0.97 mg/dL (ref 0.44–1.00)
GFR calc Af Amer: >60 mL/min (ref >60)
GFR calc non Af Amer: >60 mL/min (ref >60)
GLUCOSE: 119 mg/dL — AB (ref 70–99)
Potassium: 4 mmol/L (ref 3.5–5.1)
Sodium: 143 mmol/L (ref 135–145)
Total Bilirubin: 1.5 mg/dL — ABNORMAL HIGH (ref 0.3–1.2)
Total Protein: 7.6 g/dL (ref 6.5–8.1)

## 2018-07-11 LAB — TROPONIN I
Troponin I: 0.06 ng/mL (ref <0.03)
Troponin I: 0.08 ng/mL (ref <0.03)

## 2018-07-11 LAB — URINALYSIS, ROUTINE W REFLEX MICROSCOPIC
Bilirubin Urine: NEGATIVE
Glucose, UA: NEGATIVE mg/dL
Hgb urine dipstick: NEGATIVE
Ketones, ur: 20 mg/dL — AB
Leukocytes, UA: NEGATIVE
Nitrite: NEGATIVE
Protein, ur: 100 mg/dL — AB
Specific Gravity, Urine: >1.046 — ABNORMAL HIGH (ref 1.005–1.030)
pH: 7 (ref 5.0–8.0)

## 2018-07-11 MED ORDER — IOPAMIDOL (ISOVUE-370) INJECTION 76%
INTRAVENOUS | Status: AC
  Filled 2018-07-11: qty 100

## 2018-07-11 MED ORDER — LORAZEPAM 1 MG PO TABS
1.0000 mg | ORAL_TABLET | Freq: Once | ORAL | Status: AC
  Administered 2018-07-11: 1 mg via ORAL
  Filled 2018-07-11: qty 1

## 2018-07-11 MED ORDER — IOPAMIDOL (ISOVUE-370) INJECTION 76%
100.0000 mL | Freq: Once | INTRAVENOUS | Status: AC | PRN
  Administered 2018-07-11: 100 mL via INTRAVENOUS

## 2018-07-11 NOTE — ED Provider Notes (Signed)
[facility] EMERGENCY DEPARTMENT Provider Note   CSN: [id number] Arrival date & time: 07/11/18  1232     History   Chief Complaint Chief Complaint  Patient presents with  . Loss of Consciousness    HPI [name] is a 48 y.o. female.  The history is provided by the patient. No language interpreter was used.  Loss of Consciousness  Episode history:  Single Most recent episode:  Today Duration:  30 minutes Timing:  Constant Progression:  Worsening Chronicity:  New Witnessed: no   Relieved by:  Nothing Worsened by:  Nothing Ineffective treatments:  None tried Associated symptoms: no difficulty breathing   Pt reports she has a history of pneumonia and bronchitis.  Pt reports she has a history of lung disease.   Pt reports she has coughed up blood in the past.  Pt was admitted 12/25 after coughing up blood and having a syncopal episode.  Pt was diagnosed with pneumonia.  Pt had a cyst discovered on chest ct. 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  Pt has been seen by Dr. Anne Hahn and has been told she needs an EEg and possible ablation   Past Medical History:  Diagnosis Date  . Allergic rhinitis   . Anxiety   . B12 deficiency   . Barrett's esophagus   . Chronic constipation   . 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 Depression   . GERD (gastroesophageal reflux disease)   . Hiatal hernia   . History of DVT of lower extremity    08-14-2003  RIGHT LOWER EXTREMITY-- TREATED W/ COUMADIN----  per pt none since  . History of palpitations   . Hypertension   . Idiopathic chronic gout    12-20-2017  per pt stable ,   last episode 02/ 2019  . Iron deficiency anemia   . Migraines   . Personal history of PE (pulmonary embolism)    03/ 2005 RIGHT SIDE  --- treated w/ coumadin---  per pt none since  . PONV (postoperative nausea and vomiting)   . Tinea  versicolor   . Vitamin D deficiency     Patient Active Problem List   Diagnosis Date Noted  . Bacterial vaginosis 06/21/2018  . Aspiration pneumonia (HCC) 06/15/2018  . Syncope 06/14/2018  . Elevated troponin likely from demand ischemia 06/14/2018  . Malnutrition of moderate degree 06/14/2018  . Aspiration pneumonia of right lower lobe (HCC)   . Pulmonary emphysema (HCC)   . Dysphagia   . CAP (community acquired pneumonia) 06/13/2018  . Hemoptysis 06/13/2018  . S/P Nissen fundoplication (without gastrostomy tube) procedure 12/26/2017  . Hiatal hernia 11/30/2017  . Hypokalemia 11/30/2017  . Barrett's esophagus 11/28/2017  . Essential hypertension 06/29/2017  . Gingivitis 10/14/2016  . Gout 01/03/2014  . Pityriasis rosea 05/14/2013  . VENOUS INSUFFICIENCY, CHRONIC 12/04/2009  . Dermatophytosis of body 12/02/2009  . OTHER SPECIFIED DISEASE OF NAIL 12/02/2009  . OTITIS MEDIA 11/10/2009  . B12 deficiency 10/01/2009  . OTITIS EXTERNA 10/01/2009  . CERUMEN IMPACTION 10/01/2009  . Anxiety state 08/21/2009  . Vitamin D deficiency 02/09/2009  . DIZZINESS 02/09/2009  . Other malaise and fatigue 02/09/2009  . UNSPECIFIED ANEMIA 12/12/2008  . Palpitations 12/12/2008  . SINUSITIS- ACUTE-NOS 09/26/2008  . ACUTE BRONCHOSPASM 09/26/2008  . Depression 09/09/2008  . ONYCHOMYCOSIS 05/26/2008  . Migraine headache 05/26/2008  .  Allergic rhinitis 05/26/2008  . Asthma 05/26/2008  . GERD 05/26/2008  . HEMOPTYSIS 01/13/2007  . DVT, HX OF 01/13/2007    Past Surgical History:  Procedure Laterality Date  . BIOPSY  11/29/2017   Procedure: BIOPSY;  Surgeon: Vida RiggerMagod, Marc, MD;  Location: [facility];  Service: Endoscopy;;  . BRONCHOSCOPY  05-20-2004  dr clance  . ESOPHAGOGASTRODUODENOSCOPY (EGD) WITH PROPOFOL N/A 11/29/2017   Procedure: ESOPHAGOGASTRODUODENOSCOPY (EGD) WITH PROPOFOL;  Surgeon: Vida RiggerMagod, Marc, MD;  Location: [facility];  Service: Endoscopy;  Laterality: N/A;  . EXPLORATORY  LAPAROTOMY  AGE 89    [facility]   for constipation  . INSERTION OF MESH N/A 12/26/2017   Procedure: INSERTION OF BIO A MESH;  Surgeon: Axel Filleramirez, Armando, MD;  Location: [facility];  Service: General;  Laterality: N/A;  . KNEE ARTHROSCOPY W/ ACL RECONSTRUCTION Right 09/2003  . LAPAROSCOPY BILATERAL TUBAL FULGERATION AND ATTEMPTED THERMACHOICE ABLATION WITH UTERINE PERFORATION  07-17-2009   [facility]  . LUNG LOBECTOMY  1991   right lower lobe for massive hemoptyosis  . MINI-THORACOTOMY WEDGE RESECTION OF LEFT LOWER LOBE CYST Left 06-24-2004   [facility]   hemoptyosis     OB History   No obstetric history on file.      Home Medications    Prior to Admission medications   Medication Sig Start Date End Date Taking? Authorizing Provider  allopurinol (ZYLOPRIM) 100 MG tablet Take 1 tablet (100 mg total) by mouth daily. Must keep visit on 11/03/17 to get refills Patient taking differently: Take 100 mg by mouth daily as needed (for gout flare up). Must keep visit on 11/03/17 to get refills 11/03/17   Plotnikov, Georgina QuintAleksei V, MD  aspirin-acetaminophen-caffeine (EXCEDRIN MIGRAINE) [phone] MG tablet Take 1 tablet by mouth every 6 (six) hours as needed for headache or migraine.     [provider]  Cholecalciferol (VITAMIN D3) 2000 units TABS Take 2,000 Units by mouth every morning.     [provider]  diazepam (VALIUM) 5 MG tablet TAKE 1 TABLET BY MOUTH EVERY 12 HOURS IF NEEDED FOR ANXIETY Patient taking differently: Take 5 mg by mouth every 12 (twelve) hours as needed for anxiety. take 1 tablet by mouth every 12 hours if needed for anxiety 05/16/18   Plotnikov, Georgina QuintAleksei V, MD  ferrous gluconate (IRON 27) 240 (27 FE) MG tablet Take 240 mg by mouth every morning.     [provider]  hydrochlorothiazide (HYDRODIURIL) 25 MG tablet Take 25 mg by mouth every morning.     [provider]  metroNIDAZOLE (FLAGYL) 500 MG tablet Take 1 tablet (500 mg total) by mouth 2 (two)  times daily. 06/21/18   Plotnikov, Georgina QuintAleksei V, MD  Multiple Vitamins-Minerals (MULTIVITAMIN ADULTS PO) Take 1 tablet by mouth every morning.     [provider]  potassium chloride SA (K-DUR,KLOR-CON) 20 MEQ tablet Take 2 tablets (40 mEq total) by mouth every morning. 06/16/18   Hongalgi, Maximino GreenlandAnand D, MD  sennosides-docusate sodium (SENOKOT-S) 8.6-50 MG tablet Take 1 tablet by mouth daily as needed for constipation.    [provider]    Family History Family History  Problem Relation Age of Onset  . Hypertension Other   . Diabetes Other     Social History Social History   Tobacco Use  . Smoking status: Never Smoker  . Smokeless tobacco: Never Used  Substance Use Topics  . Alcohol use: Yes    Comment: rare  . Drug use: No  Allergies   Guaifenesin; Iron; Celexa [citalopram hydrobromide]; Codeine; Doxepin; Hydromorphone hcl; Metoprolol succinate; Morphine; Pantoprazole sodium; Pseudoeph-doxylamine-dm-apap; Pseudoephedrine; Sulfa antibiotics; Prednisone; and Tetracyclines & related   Review of Systems Review of Systems  Cardiovascular: Positive for syncope.  All other systems reviewed and are negative.    Physical Exam Updated Vital Signs BP (!) 138/106   Pulse 97   Temp (!) 97.5 F (36.4 C) (Oral)   SpO2 100%   Physical Exam Vitals signs reviewed.  Constitutional:      Comments: tearful  HENT:     Head: Normocephalic.     Comments: Dried blood corner of mouth     Right Ear: Tympanic membrane normal.     Nose: Nose normal.     Mouth/Throat:     Mouth: Mucous membranes are moist.  Eyes:     Extraocular Movements: Extraocular movements intact.     Pupils: Pupils are equal, round, and reactive to light.  Neck:     Musculoskeletal: Normal range of motion.  Cardiovascular:     Rate and Rhythm: Normal rate.     Pulses: Normal pulses.  Pulmonary:     Effort: Pulmonary effort is normal.  Abdominal:     General: Abdomen is flat.  Musculoskeletal:  Normal range of motion.  Skin:    General: Skin is warm.  Neurological:     General: No focal deficit present.     Mental Status: She is alert and oriented to person, place, and time.  Psychiatric:        Mood and Affect: Mood normal.      ED Treatments / Results  Labs (all labs ordered are listed, but only abnormal results are displayed) Labs Reviewed  CBC WITH DIFFERENTIAL/PLATELET  COMPREHENSIVE METABOLIC PANEL  URINALYSIS, ROUTINE W REFLEX MICROSCOPIC  TROPONIN I    EKG None  Radiology No results found.  Procedures Procedures (including critical care time)  Medications Ordered in ED Medications - No data to display   Initial Impression / Assessment and Plan / ED Course  I have reviewed the triage vital signs and the nursing notes.  Pertinent labs & imaging results that were available during my care of the patient were reviewed by me and considered in my medical decision making (see chart for details).  Clinical Course as of Jul 11 1426  Wed Jul 11, 2018  1425 Troponin I - ONCE - STAT(!!) [LS]    Clinical Course User Index [LS] Elson Areas, PA-C    MDM  I spoke with Dr. Arbutus Ped who advised doubt aneurysm is unlikely to be cause of pt's syncope.  He advised out pt EEG and arteriogram.  Pt has elevated troponin.  Pt has had elevation in the past.   Pt complains of anxiety.  Pt given ativan 1mg  po. Ct chest pending. Repeat troponin pending.  Pt's care turned over to Dr. Particia Nearing.   Final Clinical Impressions(s) / ED Diagnoses   Final diagnoses:  Syncope and collapse  Hemoptysis    ED Discharge Orders    None       Elson Areas, [place] 07/11/18 1538    Tegeler, Canary Brim, MD 07/11/18 1900

## 2018-07-11 NOTE — ED Notes (Signed)
Pt given discharge instruction. Pt verbalized understanding. Pt given the opportunity to answer questions.

## 2018-07-11 NOTE — ED Provider Notes (Signed)
Pt signed out by PA Sophia.  UA, 2nd trop, and CT chest pending.   2nd troponin is mildly elevated, 0.08, but consistent with what it was at d/c.  UA showed pt is dehydrated.  CT chest: IMPRESSION:  1. No pulmonary embolus or acute aortic syndrome.  2. Improved aeration of the right lung base. Unchanged left basilar  opacities.     Pt is feeling much better after treatment.  She has good follow up.  She is instructed to return if worse.    Jacalyn Lefevre, MD 07/11/18 [phone]

## 2018-07-11 NOTE — ED Notes (Signed)
Patient transported to X-ray 

## 2018-07-11 NOTE — ED Triage Notes (Signed)
Pt to ED from home after unwitnessed syncopal episode or seizure activity with unknown downtime. No fall or injury, not post-ictal. Pt sts when she woke up she was in a cold sweat and coughed up a blood clot. Hx of same. Recent dx of aneurysm.

## 2018-07-24 ENCOUNTER — Ambulatory Visit (INDEPENDENT_AMBULATORY_CARE_PROVIDER_SITE_OTHER): Payer: 59 | Admitting: Neurology

## 2018-07-24 ENCOUNTER — Telehealth: Payer: Self-pay | Admitting: Neurology

## 2018-07-24 DIAGNOSIS — R55 Syncope and collapse: Secondary | ICD-10-CM | POA: Diagnosis not present

## 2018-07-24 DIAGNOSIS — I720 Aneurysm of carotid artery: Secondary | ICD-10-CM

## 2018-07-24 NOTE — Addendum Note (Signed)
Addended by: York Spaniel on: 07/24/2018 05:03 PM   Modules accepted: Orders

## 2018-07-24 NOTE — Telephone Encounter (Signed)
I called the patient, the EEG study was normal, I had never heard back from the patient regarding the referral to Dr. Corliss Skains regarding the carotid aneurysm.  I would recommend an evaluation in this regard, if the patient is amenable to this, she is to contact our office.

## 2018-07-24 NOTE — Telephone Encounter (Signed)
The patient called back, she is okay with the interventional neuroradiology evaluation, I will get this set up.  I also recommended genetic testing, I will see if I can get her refered for genetic counseling through [facility].

## 2018-07-24 NOTE — Procedures (Signed)
    History:  [name] is a 48 year old patient with a history of hemoptysis associated with syncope in the past.  The patient had a syncopal event on 13 June 2018 while at her computer, she had sudden loss of consciousness and woke up on the floor, she had coughed up a considerable amount of blood.  The patient is being evaluated for these events.  This is a routine EEG.  No skull defects are noted.  Medications include diazepam, Pepcid, iron gluconate, hydrochlorothiazide, multivitamins, potassium supplementation, Senokot, Zyloprim, Excedrin Migraine, and vitamin D supplementation.  EEG classification: Normal awake  Description of the recording: The background rhythms of this recording consists of a fairly well modulated medium amplitude alpha rhythm of 10 Hz that is reactive to eye opening and closure. As the record progresses, the patient appears to remain in the waking state throughout the recording. Photic stimulation was performed, resulting in a bilateral and symmetric photic driving response. Hyperventilation was also performed, resulting in a minimal buildup of the background rhythm activities without significant slowing seen. At no time during the recording does there appear to be evidence of spike or spike wave discharges or evidence of focal slowing. EKG monitor shows no evidence of cardiac rhythm abnormalities with a heart rate of 84.  Impression: This is a normal EEG recording in the waking state. No evidence of ictal or interictal discharges are seen.

## 2018-07-25 ENCOUNTER — Telehealth (HOSPITAL_COMMUNITY): Payer: Self-pay

## 2018-07-25 NOTE — Telephone Encounter (Signed)
Called to schedule consult, no answer, left vm. AW  

## 2018-07-27 ENCOUNTER — Telehealth (HOSPITAL_COMMUNITY): Payer: Self-pay | Admitting: Radiology

## 2018-07-27 NOTE — Telephone Encounter (Signed)
Called pt, left VM for her to call to schedule her consult with Deveshwar for brain aneurysm tx. JM

## 2018-07-30 ENCOUNTER — Telehealth: Payer: Self-pay | Admitting: Neurology

## 2018-07-30 NOTE — Telephone Encounter (Signed)
I called Morrie Sheldon but she did not answer so I left a VM asking her to call me back.

## 2018-08-01 ENCOUNTER — Other Ambulatory Visit (HOSPITAL_COMMUNITY): Payer: Self-pay | Admitting: Interventional Radiology

## 2018-08-01 DIAGNOSIS — I729 Aneurysm of unspecified site: Secondary | ICD-10-CM

## 2018-08-03 ENCOUNTER — Ambulatory Visit (HOSPITAL_COMMUNITY): Payer: 59

## 2018-08-14 ENCOUNTER — Telehealth (HOSPITAL_COMMUNITY): Payer: Self-pay | Admitting: Radiology

## 2018-08-14 NOTE — Telephone Encounter (Signed)
Called pt, left VM to call back. Told her that her appt with Deveshwar on 08/17/18 has been changed from 9 am to 8 am. Also called pt's sister, no answer, no VM. JM

## 2018-08-17 ENCOUNTER — Ambulatory Visit (INDEPENDENT_AMBULATORY_CARE_PROVIDER_SITE_OTHER): Payer: 59 | Admitting: Internal Medicine

## 2018-08-17 ENCOUNTER — Ambulatory Visit (HOSPITAL_COMMUNITY)
Admission: RE | Admit: 2018-08-17 | Discharge: 2018-08-17 | Disposition: A | Discharge status: Home / Self Care | Payer: 59 | Source: Ambulatory Visit | Attending: Interventional Radiology | Admitting: Interventional Radiology

## 2018-08-17 ENCOUNTER — Encounter: Payer: Self-pay | Admitting: Internal Medicine

## 2018-08-17 VITALS — BP 120/90 | HR 102 | Temp 98.5°F | Ht 67.5 in | Wt 111.0 lb

## 2018-08-17 DIAGNOSIS — J690 Pneumonitis due to inhalation of food and vomit: Secondary | ICD-10-CM | POA: Diagnosis not present

## 2018-08-17 DIAGNOSIS — F411 Generalized anxiety disorder: Secondary | ICD-10-CM

## 2018-08-17 DIAGNOSIS — R6889 Other general symptoms and signs: Secondary | ICD-10-CM | POA: Diagnosis not present

## 2018-08-17 DIAGNOSIS — I729 Aneurysm of unspecified site: Secondary | ICD-10-CM

## 2018-08-17 LAB — POC INFLUENZA A&B (BINAX/QUICKVUE)
Influenza A, POC: POSITIVE — AB
Influenza B, POC: NEGATIVE

## 2018-08-17 MED ORDER — OSELTAMIVIR PHOSPHATE 75 MG PO CAPS: 75.0000 mg | ORAL_CAPSULE | Freq: Two times a day (BID) | ORAL | 0 refills | Status: DC

## 2018-08-17 NOTE — Assessment & Plan Note (Signed)
Flu test done in the office and positive. Given tamiflu and advised to consider flu vaccine after well. Offered tessalon perles and she declined.

## 2018-08-17 NOTE — Consult Note (Signed)
Chief Complaint: Patient was seen in consultation today for bilateral ICA aneurysms.  Referring Physician(s): York Spaniel  Supervising Physician: Julieanne Cotton  Patient Status: [facility] - Out-pt  History of Present Illness: [name] is a 48 y.o. female with a past medical history as below, with pertinent past medical history including hypertension, PE 2005, DVT 2005, migraines, GERD, hiatal hernia, Barrett's esophagus, B12 deficiency, vitamin D deficiency, anxiety, and depression. In 05/2018, patient had a syncopal episode while sitting at her computer. She went to [facility] ED 06/13/2018 for further management. She was found to have pneumonia and was admitted from 06/13/2018 to 06/16/2018. During hospitalization, she underwent syncopal workup which revealed an incidental finding of bilateral ICA aneurysms. Upon discharge, she was referred to neurology for further management of these incidental findings. She consulted with Dr. Anne Hahn 06/21/2018 who ordered further imaging scans for evaluation.  MR brain 06/14/2018: 1.  No acute intracranial abnormality. 2. 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 Consider fibromuscular dysplasia (FMD) or other connective tissue disease. 3. Chronic nonspecific ventriculomegaly for age appears not significantly changed since 2007. 4. Superimposed moderate nonspecific cerebral white matter signal changes. Associated chronic volume loss in the posterior corpus callosum. Differential considerations include accelerated small vessel ischemia, sequelae of trauma, hypercoagulable state, vasculitis, migraines, prior infection or demyelination. 5. A small volume of the left lateral cerebellum is herniated into an adjacent arachnoid granulation, and usually this is an asymptomatic finding.  CTA head/neck 06/29/2018: 1. 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 Findings may represent fibromuscular dysplasia or connective tissue disorder. 2. Patent carotid and vertebral arteries. No dissection or hemodynamically significant stenosis utilizing NASCET criteria. 3. Patent anterior and posterior intracranial circulation. No large vessel occlusion, focal aneurysm, or significant stenosis.  IR requested by Dr. Anne Hahn for management of bilateral ICA aneurysms. Patient awake and alert sitting in chair. Accompanied by sister. Complains of intermittent migraines. States that she has had migraines since 2007. States that she has not noticed any change to her migraines. Complains of intermittent "whooshing sound in my head" that she describes as "like when you're under water". Denies weakness, numbness/tingling, dizziness, vision changes, hearing changes, or speech difficulty.   Past Medical History:  Diagnosis Date  . Allergic rhinitis   . Anxiety   . B12 deficiency   . Barrett's esophagus   . Chronic constipation   . 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 Depression   . GERD (gastroesophageal reflux disease)   . Hiatal hernia   . History of DVT of lower extremity    08-14-2003  RIGHT LOWER EXTREMITY-- TREATED W/ COUMADIN----  per pt none since  . History of palpitations   . Hypertension   . Idiopathic chronic gout    12-20-2017  per pt stable ,   last episode 02/ 2019  . Iron deficiency anemia   . Migraines   . Personal history of PE (pulmonary embolism)    03/ 2005 RIGHT SIDE  --- treated w/ coumadin---  per pt none since  . PONV (postoperative nausea and vomiting)   . Tinea versicolor   . Vitamin D deficiency     Past Surgical History:  Procedure Laterality Date  . BIOPSY  11/29/2017   Procedure:  BIOPSY;  Surgeon: [name], MD;  Location: [facility] ENDOSCOPY;  Service: Endoscopy;;  . BRONCHOSCOPY   05-20-2004  dr clance  . ESOPHAGOGASTRODUODENOSCOPY (EGD) WITH PROPOFOL N/A 11/29/2017   Procedure: ESOPHAGOGASTRODUODENOSCOPY (EGD) WITH PROPOFOL;  Surgeon: [name], MD;  Location: [facility] ENDOSCOPY;  Service: Endoscopy;  Laterality: N/A;  . EXPLORATORY LAPAROTOMY  AGE 69    [facility]   for constipation  . INSERTION OF MESH N/A 12/26/2017   Procedure: INSERTION OF BIO A MESH;  Surgeon: [name], MD;  Location: [facility] ORS;  Service: General;  Laterality: N/A;  . KNEE ARTHROSCOPY W/ ACL RECONSTRUCTION Right 09/2003  . LAPAROSCOPY BILATERAL TUBAL FULGERATION AND ATTEMPTED THERMACHOICE ABLATION WITH UTERINE PERFORATION  07-17-2009   dr [name]  [facility]  . LUNG LOBECTOMY  1991   right lower lobe for massive hemoptyosis  . MINI-THORACOTOMY WEDGE RESECTION OF LEFT LOWER LOBE CYST Left 06-24-2004   dr Edwyna Shell [facility]   hemoptyosis    Allergies: Guaifenesin; Iron; Celexa [citalopram hydrobromide]; Codeine; Doxepin; Hydromorphone hcl; Metoprolol succinate; Morphine; Pantoprazole sodium; Pseudoeph-doxylamine-dm-apap; Pseudoephedrine; Sulfa antibiotics; Prednisone; and Tetracyclines & related  Medications: Prior to Admission medications   Medication Sig Start Date End Date Taking? Authorizing Provider  allopurinol (ZYLOPRIM) 100 MG tablet Take 1 tablet (100 mg total) by mouth daily. Must keep visit on 11/03/17 to get refills Patient taking differently: Take 100 mg by mouth daily as needed (for gout flare up). Must keep visit on 11/03/17 to get refills 11/03/17   Plotnikov, Georgina Quint, MD  aspirin-acetaminophen-caffeine (EXCEDRIN MIGRAINE) [phone] MG tablet Take 1 tablet by mouth every 6 (six) hours as needed for headache or migraine.     [provider]  Cholecalciferol (VITAMIN D3) 2000 units TABS Take 2,000 Units by mouth every morning.     [provider]  diazepam (VALIUM) 5 MG tablet TAKE 1 TABLET BY MOUTH EVERY 12 HOURS IF NEEDED FOR ANXIETY Patient taking differently: Take 5 mg by mouth  every 12 (twelve) hours as needed for anxiety. take 1 tablet by mouth every 12 hours if needed for anxiety 05/16/18   Plotnikov, Georgina Quint, MD  ferrous gluconate (IRON 27) 240 (27 FE) MG tablet Take 240 mg by mouth every morning.     [provider]  hydrochlorothiazide (HYDRODIURIL) 25 MG tablet Take 25 mg by mouth every morning.     [provider]  metroNIDAZOLE (FLAGYL) 500 MG tablet Take 1 tablet (500 mg total) by mouth 2 (two) times daily. Patient not taking: Reported on 07/11/2018 06/21/18   Plotnikov, Georgina Quint, MD  Multiple Vitamins-Minerals (MULTIVITAMIN ADULTS PO) Take 1 tablet by mouth every morning.     [provider]  potassium chloride SA (K-DUR,KLOR-CON) 20 MEQ tablet Take 2 tablets (40 mEq total) by mouth every morning. 06/16/18   Elease Etienne, MD     Family History  Problem Relation Age of Onset  . Hypertension Other   . Diabetes Other     Social History   Socioeconomic History  . Marital status: Single    Spouse name: Not on file  . Number of children: Not on file  . Years of education: Not on file  . Highest education level: Not on file  Occupational History  . Not on file  Social Needs  . Financial resource strain: Not on file  . Food insecurity:    Worry: Not on file    Inability: Not on file  . Transportation needs:    Medical: Not on file  Non-medical: Not on file  Tobacco Use  . Smoking status: Never Smoker  . Smokeless tobacco: Never Used  Substance and Sexual Activity  . Alcohol use: Yes    Comment: rare  . Drug use: No  . Sexual activity: Not on file    Comment: BTL  Lifestyle  . Physical activity:    Days per week: Not on file    Minutes per session: Not on file  . Stress: Not on file  Relationships  . Social connections:    Talks on phone: Not on file    Gets together: Not on file    Attends religious service: Not on file    Active member of club or organization: Not on file    Attends meetings of clubs  or organizations: Not on file    Relationship status: Not on file  Other Topics Concern  . Not on file  Social History Narrative  . Not on file     Review of Systems: A 12 point ROS discussed and pertinent positives are indicated in the HPI above.  All other systems are negative.  Review of Systems  Constitutional: Negative for chills and fever.  HENT: Positive for tinnitus. Negative for hearing loss.   Eyes: Negative for visual disturbance.  Respiratory: Negative for shortness of breath and wheezing.   Cardiovascular: Negative for chest pain and palpitations.  Neurological: Positive for headaches. Negative for dizziness, speech difficulty, weakness and numbness.  Psychiatric/Behavioral: Negative for behavioral problems and confusion.     Physical Exam Constitutional:      General: She is not in acute distress.    Appearance: Normal appearance.  Pulmonary:     Effort: Pulmonary effort is normal. No respiratory distress.  Skin:    General: Skin is warm and dry.  Neurological:     Mental Status: She is alert and oriented to person, place, and time.  Psychiatric:        Mood and Affect: Mood normal.        Behavior: Behavior normal.        Thought Content: Thought content normal.        Judgment: Judgment normal.      Labs:  CBC: Recent Labs    06/14/18 0345 06/15/18 0415 06/16/18 0533 07/11/18 1300  WBC 21.4* 14.0* 11.3* 15.8*  HGB 11.3* 11.4* 11.0* 14.2  HCT 35.1* 35.1* 34.7* 42.8  PLT 288 283 288 332    BMP: Recent Labs    06/14/18 0345 06/15/18 0415 06/16/18 0533 07/11/18 1300  NA 141 140 139 143  K 3.2* 3.4* 3.5 4.0  CL 110 107 105 107  CO2 20*  GLUCOSE 111* 84 101* 119*  BUN CALCIUM 8.3* 8.2* 8.5* 9.8  CREATININE 0.73 0.65 0.60 0.97  GFRNONAA >60 >60 >60 >60  GFRAA >60 >60 >60 >60    LIVER FUNCTION TESTS: Recent Labs    11/27/17 1655 11/28/17 0434 11/29/17 0446 07/11/18 1300  BILITOT 1.1 0.9 0.7 1.5*  AST 35 24  20 45*  ALT ALKPHOS 65 55 46 49  PROT 8.0 7.5 6.5 7.6  ALBUMIN 4.6 4.2 3.7 4.3     Assessment and Plan:  Bilateral ICA aneurysms. Dr. Corliss Skains was present for consultation. Reviewed imaging with patient and sister. Brought to their attention were patient's bilateral ICA aneurysms. Since patient is grossly asymptomatic, explained that there are to management options moving forward: 1- routine imaging scans to  monitor for changes or 2- a procedure called an image-guided diagnostic cerebral arteriogram. Explained procedure including risks and benefits. Explained the indication for this procedure is to see if treatment is indicated for her bilateral ICA aneurysms. Patient expresses desire to move forward with procedure.  Of note, patient is 5'7, thin, with ling thin fingers. She has a past medical history of Barrett's esophagus and possible fibromuscular dysplasia (based on CTA findings). Could be indicative of possible mixed connective tissue disease.  Plan for follow-up with an image-guided diagnostic cerebral arteriogram ASAP. Informed patient that our schedulers will call her to set up this imaging scan. Instructed patient to begin taking Aspirin 81 mg once daily. Patient states that she has a history of a bleeding gastric ulcer. Instructed patient to drink a glass of milk with Aspirin use.  All questions answered and concerns addressed. Patient and sister convey understanding and agree with plan.  Thank you for this interesting consult.  I greatly enjoyed meeting [name] and look forward to participating in their care.  A copy of this report was sent to the requesting provider on this date.  Electronically Signed: Elwin Mocha, PA-C 08/17/2018, 10:00 AM   I spent a total of 60 Minutes in face to face in clinical consultation, greater than 50% of which was counseling/coordinating care for bilateral ICA aneurysms.

## 2018-08-17 NOTE — Patient Instructions (Addendum)
You have the flu today. We have sent in tamiflu for you to take 1 pill twice a day for 5 days.  Keep taking oral allergy medicine like zyrtec or claritin daily.   Influenza, Adult Influenza is also called "the flu." It is an infection in the lungs, nose, and throat (respiratory tract). It is caused by a virus. The flu causes symptoms that are similar to symptoms of a cold. It also causes a high fever and body aches. The flu spreads easily from person to person (is contagious). Getting a flu shot (influenza vaccination) every year is the best way to prevent the flu. What are the causes? This condition is caused by the influenza virus. You can get the virus by:  Breathing in droplets that are in the air from the cough or sneeze of a person who has the virus.  Touching something that has the virus on it (is contaminated) and then touching your mouth, nose, or eyes. What increases the risk? Certain things may make you more likely to get the flu. These include:  Not washing your hands often.  Having close contact with many people during cold and flu season.  Touching your mouth, eyes, or nose without first washing your hands.  Not getting a flu shot every year. You may have a higher risk for the flu, along with serious problems such as a lung infection (pneumonia), if you:  Are older than 65.  Are pregnant.  Have a weakened disease-fighting system (immune system) because of a disease or taking certain medicines.  Have a long-term (chronic) illness, such as: ? Heart, kidney, or lung disease. ? Diabetes. ? Asthma.  Have a liver disorder.  Are very overweight (morbidly obese).  Have anemia. This is a condition that affects your red blood cells. What are the signs or symptoms? Symptoms usually begin suddenly and last 4-14 days. They may include:  Fever and chills.  Headaches, body aches, or muscle aches.  Sore throat.  Cough.  Runny or stuffy (congested) nose.  Chest  discomfort.  Not wanting to eat as much as normal (poor appetite).  Weakness or feeling tired (fatigue).  Dizziness.  Feeling sick to your stomach (nauseous) or throwing up (vomiting). How is this treated? If the flu is found early, you can be treated with medicine that can help reduce how bad the illness is and how long it lasts (antiviral medicine). This may be given by mouth (orally) or through an IV tube. Taking care of yourself at home can help your symptoms get better. Your doctor may suggest:  Taking over-the-counter medicines.  Drinking plenty of fluids. The flu often goes away on its own. If you have very bad symptoms or other problems, you may be treated in a hospital. Follow these instructions at home:     Activity  Rest as needed. Get plenty of sleep.  Stay home from work or school as told by your doctor. ? Do not leave home until you do not have a fever for 24 hours without taking medicine. ? Leave home only to visit your doctor. Eating and drinking  Take an ORS (oral rehydration solution). This is a drink that is sold at pharmacies and stores.  Drink enough fluid to keep your pee (urine) pale yellow.  Drink clear fluids in small amounts as you are able. Clear fluids include: ? Water. ? Ice chips. ? Fruit juice that has water added (diluted fruit juice). ? Low-calorie sports drinks.  Eat bland, easy-to-digest foods  in small amounts as you are able. These foods include: ? Bananas. ? Applesauce. ? Rice. ? Lean meats. ? Toast. ? Crackers.  Do not eat or drink: ? Fluids that have a lot of sugar or caffeine. ? Alcohol. ? Spicy or fatty foods. General instructions  Take over-the-counter and prescription medicines only as told by your doctor.  Use a cool mist humidifier to add moisture to the air in your home. This can make it easier for you to breathe.  Cover your mouth and nose when you cough or sneeze.  Wash your hands with soap and water often,  especially after you cough or sneeze. If you cannot use soap and water, use alcohol-based hand sanitizer.  Keep all follow-up visits as told by your doctor. This is important. How is this prevented?   Get a flu shot every year. You may get the flu shot in late summer, fall, or winter. Ask your doctor when you should get your flu shot.  Avoid contact with people who are sick during fall and winter (cold and flu season). Contact a doctor if:  You get new symptoms.  You have: ? Chest pain. ? Watery poop (diarrhea). ? A fever.  Your cough gets worse.  You start to have more mucus.  You feel sick to your stomach.  You throw up. Get help right away if you:  Have shortness of breath.  Have trouble breathing.  Have skin or nails that turn a bluish color.  Have very bad pain or stiffness in your neck.  Get a sudden headache.  Get sudden pain in your face or ear.  Cannot eat or drink without throwing up. Summary  Influenza ("the flu") is an infection in the lungs, nose, and throat. It is caused by a virus.  Take over-the-counter and prescription medicines only as told by your doctor.  Getting a flu shot every year is the best way to avoid getting the flu. This information is not intended to replace advice given to you by your health care provider. Make sure you discuss any questions you have with your health care provider. Document Released: 03/15/2008 Document Revised: 11/22/2017 Document Reviewed: 11/22/2017 Elsevier Interactive Patient Education  2019 [employer].

## 2018-08-17 NOTE — Assessment & Plan Note (Signed)
She does request more or different anxiety medication at the end of our visit. As this was not discussed and her PCP is already treating this was declined. Review of [facility] narcotic database reveals last fill for increased amount of valium 07/25/2018 for #60 and previously was getting #30 per month.

## 2018-08-17 NOTE — Progress Notes (Signed)
   Subjective:   Patient ID: [name], female    DOB: 1971-05-14, 48 y.o.   MRN: [id number]  HPI The patient is a 48 y.o. female coming in for cold/flu symptoms. Has recent complicated medical history. Reviewed hospital admission for December with syncope and aspiration pneumonia with underlying lung cyst detected. Needs follow up with pulmonary with CT in 3-6 months from that time. Started 2-3 days ago. Main symptoms are: fevers and chills and body aches and congestion. Denies SOB and mild coughing. Overall it is worsening. Has tried tylenol which helped with fevers.   Review of Systems  Constitutional: Positive for activity change, appetite change, chills, fatigue and fever. Negative for unexpected weight change.  HENT: Positive for congestion, postnasal drip, rhinorrhea and sinus pressure. Negative for ear discharge, ear pain, sinus pain, sneezing, sore throat, tinnitus, trouble swallowing and voice change.   Eyes: Negative.   Respiratory: Positive for cough. Negative for chest tightness, shortness of breath and wheezing.   Cardiovascular: Negative.   Gastrointestinal: Negative.   Musculoskeletal: Positive for myalgias.  Neurological: Negative.     Objective:  Physical Exam Constitutional:      Appearance: She is well-developed.  HENT:     Head: Normocephalic and atraumatic.     Comments: Oropharynx with redness and clear drainage, nose with swollen turbinates, TMs normal bilaterally.  Neck:     Musculoskeletal: Normal range of motion.     Thyroid: No thyromegaly.  Cardiovascular:     Rate and Rhythm: Normal rate and regular rhythm.  Pulmonary:     Effort: Pulmonary effort is normal. No respiratory distress.     Breath sounds: Normal breath sounds. No wheezing or rales.  Abdominal:     Palpations: Abdomen is soft.  Musculoskeletal:        General: Tenderness present.  Lymphadenopathy:     Cervical: No cervical adenopathy.  Skin:    General: Skin is warm and dry.    Neurological:     Mental Status: She is alert and oriented to person, place, and time.     Vitals:   08/17/18 0944  BP: 120/90  Pulse: (!) 102  Temp: 98.5 F (36.9 C)  TempSrc: Oral  SpO2: 98%  Weight: 111 lb (50.3 kg)  Height: 5' 7.5" (1.715 m)    Assessment & Plan:

## 2018-08-17 NOTE — Assessment & Plan Note (Signed)
Will be following with pulmonary and has cyst which needs CT chest in March-June 2020.

## 2018-08-22 ENCOUNTER — Ambulatory Visit: Payer: Self-pay | Admitting: *Deleted

## 2018-08-22 NOTE — Telephone Encounter (Signed)
Summary: flu not getting better    Pts sister would like advice of what her sister can do before her appt in the morning to help with her flu symptoms not getting any better. Please advise.      Patient finished Tamiflu this morning- patient states it helped keep the temperature down. Patient is still coughing, vomiting,and weakness. Patient is concerned about her job- she works from home.  Gatorade, tylenol, cough- delsym, Tussinex when able.  Patient is requesting antibiotic called tonight- she will come the appointment tomorrow. Patient does not want to go to ED. Patient is coughing- severe- chest tightness reported. Patient will not go to ED or UC. She will keep appointment at office tomorrow- but sister is requesting to start her on antibiotic tonight. Told her I would send request- but can not promise anything.   Reason for Disposition . [1] Difficulty breathing AND [2] not severe AND [3] not from stuffy nose (e.g., not relieved by cleaning out the nose)    Patient is coughing- severe- chest tightness reported. Patient will not go to ED or UC. She will keep appointment at office tomorrow- but sister is requesting to start her on antibiotic tonight. Told her I would send request- but can not promise anything.  Answer Assessment - Initial Assessment Questions 1. DIAGNOSIS CONFIRMATION: "When was the influenza diagnosed?" "By whom?" "Did you get a test for it?"     Friday- tested positive 2. MEDICINES: "Were you prescribed any medications for the influenza?"  (e.g., zanamivir [Relenza], oseltamavir [Tamiflu]).      Tamiflu 3. ONSET of SYMPTOMS: "When did your symptoms start?"     Tuesday night- last week 4. SYMPTOMS: "What symptoms are you most concerned about?" (e.g., runny nose, stuffy nose, sore throat, cough, breathing difficulty, fever)     Weakness, cough, vomiting 5. COUGH: "How bad is the cough?"     severe 6. FEVER: "Do you have a fever?" If so, ask: "What is your temperature,  how was it measured, and when did it start?"     98.3 7. RESPIRATORY DISTRESS: "Are you having any trouble breathing?" If yes, ask: "Describe your breathing."      Yes- tightness in chest 8. FLU VACCINE: "Did you receive a flu shot this year?" (e.g., seasonal influenza, H1N1)     no 9. PREGNANCY: "Is there any chance you are pregnant?" "When was your last menstrual period?"     no 10. HIGH RISK for COMPLICATIONS: "Do you have any heart or lung problems? Do you have a weakened immune system?" (e.g., CHF, COPD, asthma, HIV positive, chemotherapy, renal failure, diabetes mellitus, sickle cell anemia)       Hx lung surgery, pneumonia hx, bronchitis hx  Protocols used: INFLUENZA FOLLOW-UP CALL-A-AH

## 2018-08-23 ENCOUNTER — Ambulatory Visit: Payer: 59 | Admitting: Internal Medicine

## 2018-08-23 NOTE — Telephone Encounter (Signed)
Pt canceled appointment, no medication will be sent in

## 2018-09-04 ENCOUNTER — Telehealth (HOSPITAL_COMMUNITY): Payer: Self-pay | Admitting: Radiology

## 2018-09-04 NOTE — Telephone Encounter (Signed)
Called pt, left VM for her to call to scheduled cerebral angiogram with Deveshwar. JM

## 2018-09-12 ENCOUNTER — Telehealth (HOSPITAL_COMMUNITY): Payer: Self-pay | Admitting: Radiology

## 2018-09-12 NOTE — Telephone Encounter (Signed)
Pt returned my call on 09/11/18. She was concerned about scheduling her cerebral angiogram. I told her that at this time, due to COVID-19, we are not scheduling elective procedures and that I would call her back mid to late April. She was in agreement with this plan of care. JM

## 2018-10-16 ENCOUNTER — Other Ambulatory Visit (HOSPITAL_COMMUNITY): Payer: Self-pay | Admitting: Interventional Radiology

## 2018-10-16 DIAGNOSIS — I671 Cerebral aneurysm, nonruptured: Secondary | ICD-10-CM

## 2018-10-16 DIAGNOSIS — L988 Other specified disorders of the skin and subcutaneous tissue: Secondary | ICD-10-CM

## 2018-10-19 ENCOUNTER — Telehealth: Payer: Self-pay | Admitting: Neurology

## 2018-10-19 ENCOUNTER — Ambulatory Visit (INDEPENDENT_AMBULATORY_CARE_PROVIDER_SITE_OTHER): Payer: 59 | Admitting: Internal Medicine

## 2018-10-19 ENCOUNTER — Encounter: Payer: Self-pay | Admitting: Internal Medicine

## 2018-10-19 ENCOUNTER — Telehealth: Payer: Self-pay | Admitting: Internal Medicine

## 2018-10-19 ENCOUNTER — Ambulatory Visit: Payer: Self-pay

## 2018-10-19 ENCOUNTER — Other Ambulatory Visit: Payer: Self-pay

## 2018-10-19 ENCOUNTER — Inpatient Hospital Stay (HOSPITAL_COMMUNITY)
Admission: EM | Admit: 2018-10-19 | Discharge: 2018-11-02 | DRG: 027 | Disposition: A | Discharge status: Home / Self Care | Payer: 59 | Attending: Internal Medicine | Admitting: Internal Medicine

## 2018-10-19 DIAGNOSIS — Z833 Family history of diabetes mellitus: Secondary | ICD-10-CM

## 2018-10-19 DIAGNOSIS — F419 Anxiety disorder, unspecified: Secondary | ICD-10-CM | POA: Diagnosis present

## 2018-10-19 DIAGNOSIS — R059 Cough, unspecified: Secondary | ICD-10-CM

## 2018-10-19 DIAGNOSIS — Z885 Allergy status to narcotic agent status: Secondary | ICD-10-CM

## 2018-10-19 DIAGNOSIS — G43909 Migraine, unspecified, not intractable, without status migrainosus: Secondary | ICD-10-CM | POA: Diagnosis present

## 2018-10-19 DIAGNOSIS — H9312 Tinnitus, left ear: Secondary | ICD-10-CM | POA: Diagnosis not present

## 2018-10-19 DIAGNOSIS — Z86711 Personal history of pulmonary embolism: Secondary | ICD-10-CM

## 2018-10-19 DIAGNOSIS — I10 Essential (primary) hypertension: Secondary | ICD-10-CM | POA: Diagnosis present

## 2018-10-19 DIAGNOSIS — K219 Gastro-esophageal reflux disease without esophagitis: Secondary | ICD-10-CM | POA: Diagnosis present

## 2018-10-19 DIAGNOSIS — K449 Diaphragmatic hernia without obstruction or gangrene: Secondary | ICD-10-CM | POA: Diagnosis present

## 2018-10-19 DIAGNOSIS — Z882 Allergy status to sulfonamides status: Secondary | ICD-10-CM

## 2018-10-19 DIAGNOSIS — H5704 Mydriasis: Secondary | ICD-10-CM | POA: Diagnosis present

## 2018-10-19 DIAGNOSIS — F329 Major depressive disorder, single episode, unspecified: Secondary | ICD-10-CM | POA: Diagnosis present

## 2018-10-19 DIAGNOSIS — I671 Cerebral aneurysm, nonruptured: Secondary | ICD-10-CM | POA: Diagnosis not present

## 2018-10-19 DIAGNOSIS — R112 Nausea with vomiting, unspecified: Secondary | ICD-10-CM

## 2018-10-19 DIAGNOSIS — E876 Hypokalemia: Secondary | ICD-10-CM | POA: Diagnosis present

## 2018-10-19 DIAGNOSIS — H93A2 Pulsatile tinnitus, left ear: Secondary | ICD-10-CM | POA: Diagnosis present

## 2018-10-19 DIAGNOSIS — Z86718 Personal history of other venous thrombosis and embolism: Secondary | ICD-10-CM

## 2018-10-19 DIAGNOSIS — Z902 Acquired absence of lung [part of]: Secondary | ICD-10-CM

## 2018-10-19 DIAGNOSIS — E538 Deficiency of other specified B group vitamins: Secondary | ICD-10-CM | POA: Diagnosis present

## 2018-10-19 DIAGNOSIS — M1A00X0 Idiopathic chronic gout, unspecified site, without tophus (tophi): Secondary | ICD-10-CM | POA: Diagnosis present

## 2018-10-19 DIAGNOSIS — Z8249 Family history of ischemic heart disease and other diseases of the circulatory system: Secondary | ICD-10-CM

## 2018-10-19 DIAGNOSIS — L988 Other specified disorders of the skin and subcutaneous tissue: Secondary | ICD-10-CM

## 2018-10-19 DIAGNOSIS — Z1159 Encounter for screening for other viral diseases: Secondary | ICD-10-CM

## 2018-10-19 DIAGNOSIS — J449 Chronic obstructive pulmonary disease, unspecified: Secondary | ICD-10-CM | POA: Diagnosis present

## 2018-10-19 DIAGNOSIS — R05 Cough: Secondary | ICD-10-CM

## 2018-10-19 DIAGNOSIS — K2270 Barrett's esophagus without dysplasia: Secondary | ICD-10-CM | POA: Diagnosis present

## 2018-10-19 DIAGNOSIS — Z888 Allergy status to other drugs, medicaments and biological substances status: Secondary | ICD-10-CM

## 2018-10-19 DIAGNOSIS — D72829 Elevated white blood cell count, unspecified: Secondary | ICD-10-CM | POA: Diagnosis not present

## 2018-10-19 DIAGNOSIS — Z79899 Other long term (current) drug therapy: Secondary | ICD-10-CM

## 2018-10-19 DIAGNOSIS — D509 Iron deficiency anemia, unspecified: Secondary | ICD-10-CM | POA: Diagnosis present

## 2018-10-19 DIAGNOSIS — H9319 Tinnitus, unspecified ear: Secondary | ICD-10-CM | POA: Insufficient documentation

## 2018-10-19 LAB — URINALYSIS, ROUTINE W REFLEX MICROSCOPIC
Bilirubin Urine: NEGATIVE
Glucose, UA: NEGATIVE mg/dL
Ketones, ur: 20 mg/dL — AB
Leukocytes,Ua: NEGATIVE
Nitrite: NEGATIVE
Protein, ur: 100 mg/dL — AB
RBC / HPF: >50 RBC/hpf — ABNORMAL HIGH (ref 0–5)
Specific Gravity, Urine: 1.014 (ref 1.005–1.030)
pH: 8 (ref 5.0–8.0)

## 2018-10-19 LAB — COMPREHENSIVE METABOLIC PANEL
ALT: 12 U/L (ref 0–44)
AST: 25 U/L (ref 15–41)
Albumin: 5.2 g/dL — ABNORMAL HIGH (ref 3.5–5.0)
Alkaline Phosphatase: 57 U/L (ref 38–126)
Anion gap: 18 — ABNORMAL HIGH (ref 5–15)
BUN: 11 mg/dL (ref 6–20)
CO2: 22 mmol/L (ref 22–32)
Calcium: 12.7 mg/dL — ABNORMAL HIGH (ref 8.9–10.3)
Chloride: 100 mmol/L (ref 98–111)
Creatinine, Ser: 0.93 mg/dL (ref 0.44–1.00)
GFR calc Af Amer: >60 mL/min (ref >60)
GFR calc non Af Amer: >60 mL/min (ref >60)
Glucose, Bld: 123 mg/dL — ABNORMAL HIGH (ref 70–99)
Potassium: 3 mmol/L — ABNORMAL LOW (ref 3.5–5.1)
Sodium: 140 mmol/L (ref 135–145)
Total Bilirubin: 1.4 mg/dL — ABNORMAL HIGH (ref 0.3–1.2)
Total Protein: 8.6 g/dL — ABNORMAL HIGH (ref 6.5–8.1)

## 2018-10-19 LAB — CBC
HCT: 48.3 % — ABNORMAL HIGH (ref 36.0–46.0)
Hemoglobin: 16.5 g/dL — ABNORMAL HIGH (ref 12.0–15.0)
MCH: 29.6 pg (ref 26.0–34.0)
MCHC: 34.2 g/dL (ref 30.0–36.0)
MCV: 86.6 fL (ref 80.0–100.0)
Platelets: 350 10*3/uL (ref 150–400)
RBC: 5.58 MIL/uL — ABNORMAL HIGH (ref 3.87–5.11)
RDW: 12.5 % (ref 11.5–15.5)
WBC: 11.9 10*3/uL — ABNORMAL HIGH (ref 4.0–10.5)
nRBC: 0 % (ref 0.0–0.2)

## 2018-10-19 LAB — I-STAT BETA HCG BLOOD, ED (MC, WL, AP ONLY): I-stat hCG, quantitative: <5 m[IU]/mL (ref <5)

## 2018-10-19 LAB — LIPASE, BLOOD: Lipase: 23 U/L (ref 11–51)

## 2018-10-19 MED ORDER — SODIUM CHLORIDE 0.9% FLUSH
3.0000 mL | Freq: Once | INTRAVENOUS | Status: AC
  Administered 2018-10-20: 3 mL via INTRAVENOUS

## 2018-10-19 MED ORDER — METOCLOPRAMIDE HCL 5 MG/ML IJ SOLN
10.0000 mg | Freq: Once | INTRAMUSCULAR | Status: AC
  Administered 2018-10-20: 10 mg via INTRAVENOUS
  Filled 2018-10-19: qty 2

## 2018-10-19 MED ORDER — PROMETHAZINE HCL 12.5 MG PO TABS: 12.5000 mg | ORAL_TABLET | Freq: Three times a day (TID) | ORAL | 0 refills | Status: DC | PRN

## 2018-10-19 NOTE — Telephone Encounter (Signed)
Noted link sent 

## 2018-10-19 NOTE — Telephone Encounter (Signed)
Pt. Called to report onset of hearing sound in left ear last night at 9:00 PM, that has continued; describes that it sounds like she is "in a wind tunnel."   Reported "left eye puffy", when she woke up this morning; denied redness, itching or drainage from left eye.  Denied headache, dizziness, blurred vision, speech changes, weakness in extremities, or balance issues.  Also denied any sinus or nasal congestion at this time.  Had runny nose 1-2 weeks ago.  Denied fever.  Also, stated "it's like blood is trying to get through a blood vessel to my brain."  Stated she had an episode in December, where she fainted, and was diagnosed with an aneurysm outside the brain, on the right side.  Called [facility]; transferred pt. To the office for virtual appt.  Agreed with plan.   Reason for Disposition . MODERATE-SEVERE tinnitus (i.e., interferes with work, school, or sleep)  Answer Assessment - Initial Assessment Questions 1. LOCATION: "Which ear is involved?"       Left ear  2. SENSATION: "Describe how the ear feels."      No discomfort; the noise is annoying  3. ONSET:  "When did the ear symptoms start?"       Last night about 9:00 PM 4. PAIN: "Do you also have an earache?" If so, ask: "How bad is it?" (Scale 1-10; or mild, moderate, severe)     Denied pain  5. CAUSE: "What do you think is causing the ear congestion?"     Unknown; sounds like she is in a wind tunnel ; amplifies if she covers her ears 6. URI: "Do you have a runny nose or cough?"      Had "a little bit of a runny nose about 1-2 weeks ago" 7. NASAL ALLERGIES: "Are there symptoms of hay fever, such as sneezing or a clear nasal discharge?"    Denied any allergy symptoms; left eye puffy this morning; no watering or itching; no fever / chills ; denied dizziness or headache; "I don't feel right"  8. PREGNANCY: "Is there any chance you are pregnant?" "When was your last menstrual period?"    LMP; on menstrual cycle now ; had tubal ligation  Answer  Assessment - Initial Assessment Questions 1. DESCRIPTION: "Describe the sound you are hearing." (e.g., hissing, humming, pounding, ringing)     Left ear- sounds like in a wind tunnel 2. LOCATION: "One or both ears?" If one, ask: "Which ear?"     Left ear and head 3. SEVERITY: "How bad is it?"    - MILD - doesn't interfere with normal activities, only can hear in a quiet room    - MODERATE - SEVERE ([place]other[place]): interferes with work, school, sleep, or other activities      moderate 4. ONSET: "When did this begin?" "Did it start suddenly or come on gradually?"     4/30 @ approx. 9:00 PM 5. PATTERN: "Does this come and go, or has it been constant since it started?"     constant 6. HEARING LOSS: "Is your hearing decreased?" (e.g., normal, decreased)       no 7. OTHER SYMPTOMS: "Do you have any other symptoms?" (e.g., dizziness, earache)     Denied dizziness, headache, ear pain, fever, blurred vision, speech changes, weakness of extremities, balance issues.  C/o puffy left eye; no redness, itching, or drainage from left eye 8. PREGNANCY: "Is there any chance you are pregnant?" "When was your last menstrual period?"     LMP; on  menstrual cycle now; tubal ligation  Protocols used: TINNITUS-A-AH, EAR - CONGESTION-A-AH

## 2018-10-19 NOTE — ED Notes (Signed)
Pt updated on wait for treatment room. 

## 2018-10-19 NOTE — Telephone Encounter (Signed)
Patient's sister called and told the PEC agent that the patient was not feeling any better and could not talk. When the call was transferred to me, I asked what all was going on and if she was not able to talk. The patient's sister said that she would try and gave the phone to the patient. She was able to talk but sounded slightly winded.  She said that she is still having the sound in her ear. She compared it to the sound that a DVD player makes when it is loading a disk and said that it is hard to hear people talking because of the noise. She was advised this morning during a Virtual Visit with Dr. Okey Dupre to take Zytrec and Phenergan. She did take the medication this morning but then threw up about an hour later. Patient states that she has thrown up 3 times today.  She mentioned that at one point her eye started to feel like it was closing and swollen. Per patient, it has remained closed and swollen throughout the day. She was able to get around 2 1/2 hours of sleep today but it did not seem to help and has been trying to drink liquids. She has been antsy most of the day and not able to sit still.  While on the phone, the patient coughed and moaned. I asked if she was having headaches and she said no but "my head does not feel like it is all together". She also threw up while on the phone. I advised the patient and her sister that she needed to be evaluated and instructed them to go to [facility] Emergency Room.  She expressed understanding and appreciation and said they would go now.

## 2018-10-19 NOTE — Progress Notes (Signed)
Virtual Visit via Video Note  I connected with [name] on 10/19/18 at  9:40 AM EDT by a video enabled telemedicine application and verified that I am speaking with the correct person using two identifiers.   I discussed the limitations of evaluation and management by telemedicine and the availability of in person appointments. The patient expressed understanding and agreed to proceed.  History of Present Illness: The patient is a 48 y.o. female with visit for whooshing sound in her left ear which started last night. 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 She has been evaluated and needs coil/stent which has been delayed due to pandemic. She does normally get allergy symptoms around this time of year. She does not normally take anything for allergies. She denies ear pain or pressure. Denies sinus pain or pressure. Some left eye swelling this morning which is resolving now. She denies headache (has migraines but none lately) or weakness or numbness. No speech changes or facial drooping. Has been persistent since onset. Denies similar symptoms in the past. Denies fevers or chills or cough or SOB. Overall it is stable. Has tried nothing  Observations/Objective: Appearance: normal, breathing appears normal, casual grooming, abdomen does not appear distended, throat no swelling oropharynx, mental status is A and O times 3, no facial drooping or slurred speech on exam  Assessment and Plan: See problem oriented charting  Follow Up Instructions: start zyrtec for allergies, contact us next week with progress, if new headache or numbness or weakness or speech changes seek care in ER immediately.   I discussed the assessment and treatment plan with the patient. The patient was provided an opportunity to ask questions and all were answered. The patient agreed with the plan and demonstrated an understanding of the instructions.   The patient was advised to call back or seek an  in-person evaluation if the symptoms worsen or if the condition fails to improve as anticipated.  Myrlene Broker, MD

## 2018-10-19 NOTE — Telephone Encounter (Signed)
Patient called on 10/18/18 at 10:08, via after hours call service re: windtunnel like sound in her head. I was able to call her right away, this is a late entry. She reported, she has a Hx of Migraine and recent Dx of br aneurym.  She denied one sided weakn,, numbn, HA, tingling, slurring of speech, vertigo. Has had ear inf. Before and felt different. She was advised, that if Sx sounds non specific and unlikely d/t aneurysm issue, could be due to BP issue, stress, dehydration, lack of sleep. She was advised to try to rest, she asked, if she could take tylenol, and she was advised, that I did not think tylenol would help very much, as she is not in pain, however, if she thought, she could rest better after taking it and go to bed, she certainly could take it. She is to have an IR procedure for her aneurysm, under Dr. Corliss Skains (?coiling), but this has been delayed, she has not heard back yet. She was advised, I will send message to Dr. Anne Hahn and copy Dr. Corliss Skains. She was advised to call 911 should she have any acute/severe HA, or new or one sided neurological Sx. Her sister was with her. If Dr. Anne Hahn has any other interim advice, he or his nurse may reach out to her. She demonstrated understanding and voiced agreement.

## 2018-10-19 NOTE — Assessment & Plan Note (Signed)
This does not sound typical for aneurysm rupture and is not on the correct side of the aneurysm. Likely related to some fluid in the ear from allergies and advised to start taking zyrtec. Advised on typical symptoms of aneurysm rupture and when she should seek care in ER emergently. Also counseled on the importance of social distancing.

## 2018-10-19 NOTE — ED Triage Notes (Signed)
Pt reports she has had a "whooshing sound" in her head since last nigh. 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 Pt reports noticing her L eye was puffy this am. Pt reports nausea, dry heaves, and vomiting. Pt reports talking to her PCP, prescribed phenergan, took one at noon today with no relief. Pt denies fever, cough, sick contact, or recent travel.

## 2018-10-19 NOTE — ED Provider Notes (Signed)
[facility] EMERGENCY DEPARTMENT Provider Note   CSN: [id number] Arrival date & time: 10/19/18  1810    History   Chief Complaint Chief Complaint  Patient presents with  . Emesis    HPI Lema Stidham Falero is a 48 y.o. female.     Patient presents to the emergency department with a chief complaint of tinnitus.  She states that she has been having a "whooshing" sound in her ear, which has been worsening over the past couple of days.  She states that she had several episodes of forceful vomiting today and burst a blood vessel in her eye.  She complains of some left sided eye pain with movement, but denies any vision changes.  She denies any headache.  Of note, she has a left ICA aneurysm 22mm, and is scheduled to have this fixed in the near future, but has been delayed due to COVID-19 pandemic.  She denies any chest pain, or abdominal pain.  The history is provided by the patient. No language interpreter was used.    Past Medical History:  Diagnosis Date  . Allergic rhinitis   . Anxiety   . B12 deficiency   . Barrett's esophagus   . Chronic constipation   . 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 Depression   . GERD (gastroesophageal reflux disease)   . Hiatal hernia   . History of DVT of lower extremity    08-14-2003  RIGHT LOWER EXTREMITY-- TREATED W/ COUMADIN----  per pt none since  . History of palpitations   . Hypertension   . Idiopathic chronic gout    12-20-2017  per pt stable ,   last episode 02/ 2019  . Iron deficiency anemia   . Migraines   . Personal history of PE (pulmonary embolism)    03/ 2005 RIGHT SIDE  --- treated w/ coumadin---  per pt none since  . PONV (postoperative nausea and vomiting)   . Tinea versicolor   . Vitamin D deficiency     Patient Active Problem List   Diagnosis Date Noted  . Tinnitus 10/19/2018  . Flu-like symptoms 08/17/2018  . Bacterial vaginosis 06/21/2018  . Aspiration  pneumonia (HCC) 06/15/2018  . Syncope 06/14/2018  . Elevated troponin likely from demand ischemia 06/14/2018  . Malnutrition of moderate degree 06/14/2018  . Aspiration pneumonia of right lower lobe (HCC)   . Pulmonary emphysema (HCC)   . Dysphagia   . CAP (community acquired pneumonia) 06/13/2018  . Hemoptysis 06/13/2018  . S/P Nissen fundoplication (without gastrostomy tube) procedure 12/26/2017  . Hiatal hernia 11/30/2017  . Hypokalemia 11/30/2017  . Barrett's esophagus 11/28/2017  . Essential hypertension 06/29/2017  . Gingivitis 10/14/2016  . Gout 01/03/2014  . Pityriasis rosea 05/14/2013  . VENOUS INSUFFICIENCY, CHRONIC 12/04/2009  . Dermatophytosis of body 12/02/2009  . OTHER SPECIFIED DISEASE OF NAIL 12/02/2009  . OTITIS MEDIA 11/10/2009  . B12 deficiency 10/01/2009  . OTITIS EXTERNA 10/01/2009  . CERUMEN IMPACTION 10/01/2009  . Anxiety state 08/21/2009  . Vitamin D deficiency 02/09/2009  . DIZZINESS 02/09/2009  . Other malaise and fatigue 02/09/2009  . UNSPECIFIED ANEMIA 12/12/2008  . Palpitations 12/12/2008  . SINUSITIS- ACUTE-NOS 09/26/2008  . ACUTE BRONCHOSPASM 09/26/2008  . Depression 09/09/2008  . ONYCHOMYCOSIS 05/26/2008  . Migraine headache 05/26/2008  . Allergic rhinitis 05/26/2008  . Asthma 05/26/2008  . GERD 05/26/2008  . HEMOPTYSIS 01/13/2007  . DVT, HX OF 01/13/2007  Past Surgical History:  Procedure Laterality Date  . BIOPSY  11/29/2017   Procedure: BIOPSY;  Surgeon: [name], MD;  Location: [facility];  Service: Endoscopy;;  . BRONCHOSCOPY  05-20-2004  dr clance  . ESOPHAGOGASTRODUODENOSCOPY (EGD) WITH PROPOFOL N/A 11/29/2017   Procedure: ESOPHAGOGASTRODUODENOSCOPY (EGD) WITH PROPOFOL;  Surgeon: [name], MD;  Location: [facility] END[facility]COPY;  Service: Endoscopy;  Laterality: N/A;  . EXPLORATORY LAPAROTOMY  AGE 62    [facility]   for constipation  . INSERTION OF MESH N/A 12/26/2017   Procedure: INSERTION OF BIO A MESH;  Surgeon: [name], MD;   Location: [facility] ORS;  Service: General;  Laterality: N/A;  . KNEE ARTHROSCOPY W/ ACL RECONSTRUCTION Right 09/2003  . LAPAROSCOPY BILATERAL TUBAL FULGERATION AND ATTEMPTED THERMACHOICE ABLATION WITH UTERINE PERFORATION  07-17-2009   dr [name]  [facility]  . LUNG LOBECTOMY  1991   right lower lobe for massive hemoptyosis  . MINI-THORACOTOMY WEDGE RESECTION OF LEFT LOWER LOBE CYST Left 06-24-2004   dr Edwyna Shell [facility]   hemoptyosis     OB History   No obstetric history on file.      Home Medications    Prior to Admission medications   Medication Sig Start Date End Date Taking? Authorizing Provider  allopurinol (ZYLOPRIM) 100 MG tablet Take 1 tablet (100 mg total) by mouth daily. Must keep visit on 11/03/17 to get refills Patient taking differently: Take 100 mg by mouth daily as needed (for gout flare up). Must keep visit on 11/03/17 to get refills 11/03/17   Plotnikov, Georgina Quint, MD  aspirin-acetaminophen-caffeine (EXCEDRIN MIGRAINE) [phone] MG tablet Take 1 tablet by mouth every 6 (six) hours as needed for headache or migraine.     [provider]  Cholecalciferol (VITAMIN D3) 2000 units TABS Take 2,000 Units by mouth every morning.     [provider]  diazepam (VALIUM) 5 MG tablet TAKE 1 TABLET BY MOUTH EVERY 12 HOURS IF NEEDED FOR ANXIETY Patient taking differently: Take 5 mg by mouth every 12 (twelve) hours as needed for anxiety. take 1 tablet by mouth every 12 hours if needed for anxiety 05/16/18   Plotnikov, Georgina Quint, MD  ferrous gluconate (IRON 27) 240 (27 FE) MG tablet Take 240 mg by mouth every morning.     [provider]  hydrochlorothiazide (HYDRODIURIL) 25 MG tablet Take 25 mg by mouth every morning.     [provider]  metroNIDAZOLE (FLAGYL) 500 MG tablet Take 1 tablet (500 mg total) by mouth 2 (two) times daily. Patient not taking: Reported on 07/11/2018 06/21/18   Plotnikov, Georgina Quint, MD  Multiple Vitamins-Minerals (MULTIVITAMIN ADULTS PO) Take 1  tablet by mouth every morning.     [provider]  oseltamivir (TAMIFLU) 75 MG capsule Take 1 capsule (75 mg total) by mouth 2 (two) times daily. 08/17/18   Myrlene Broker, MD  potassium chloride SA (K-DUR,KLOR-CON) 20 MEQ tablet Take 2 tablets (40 mEq total) by mouth every morning. 06/16/18   Hongalgi, Maximino Greenland, MD  promethazine (PHENERGAN) 12.5 MG tablet Take 1 tablet (12.5 mg total) by mouth every 8 (eight) hours as needed for nausea or vomiting. 10/19/18   Myrlene Broker, MD    Family History Family History  Problem Relation Age of Onset  . Hypertension Other   . Diabetes Other     Social History Social History   Tobacco Use  . Smoking status: Never Smoker  . Smokeless tobacco: Never Used  Substance Use Topics  . Alcohol  use: Yes    Comment: rare  . Drug use: No     Allergies   Guaifenesin; Iron; Celexa [citalopram hydrobromide]; Codeine; Doxepin; Hydromorphone hcl; Metoprolol succinate; Morphine; Pantoprazole sodium; Pseudoeph-doxylamine-dm-apap; Pseudoephedrine; Sulfa antibiotics; Prednisone; and Tetracyclines & related   Review of Systems Review of Systems  All other systems reviewed and are negative.    Physical Exam Updated Vital Signs BP (!) 169/112   Pulse (!) 101   Temp 98.7 F (37.1 C) (Oral)   Resp 19   Ht 5' 7.5" (1.715 m)   Wt 50.3 kg   SpO2 99%   BMI 17.13 kg/m   Physical Exam Vitals signs and nursing note reviewed.  Constitutional:      General: She is not in acute distress.    Appearance: She is well-developed.  HENT:     Head: Normocephalic and atraumatic.  Eyes:     Conjunctiva/sclera: Conjunctivae normal.     Comments: Mildly proptotic left eye with subconjunctival hematoma, some discomfort with eye movement  Neck:     Musculoskeletal: Neck supple.  Cardiovascular:     Rate and Rhythm: Normal rate and regular rhythm.     Heart sounds: No murmur.  Pulmonary:     Effort: Pulmonary effort is normal. No respiratory  distress.     Breath sounds: Normal breath sounds.  Abdominal:     Palpations: Abdomen is soft.     Tenderness: There is no abdominal tenderness.  Skin:    General: Skin is warm and dry.  Neurological:     Mental Status: She is alert and oriented to person, place, and time.  Psychiatric:        Mood and Affect: Mood normal.        Behavior: Behavior normal.        Thought Content: Thought content normal.        Judgment: Judgment normal.      ED Treatments / Results  Labs (all labs ordered are listed, but only abnormal results are displayed) Labs Reviewed  COMPREHENSIVE METABOLIC PANEL - Abnormal; Notable for the following components:      Result Value   Potassium 3.0 (*)    Glucose, Bld 123 (*)    Calcium 12.7 (*)    Total Protein 8.6 (*)    Albumin 5.2 (*)    Total Bilirubin 1.4 (*)    Anion gap 18 (*)    All other components within normal limits  CBC - Abnormal; Notable for the following components:   WBC 11.9 (*)    RBC 5.58 (*)    Hemoglobin 16.5 (*)    HCT 48.3 (*)    All other components within normal limits  URINALYSIS, ROUTINE W REFLEX MICROSCOPIC - Abnormal; Notable for the following components:   APPearance CLOUDY (*)    Hgb urine dipstick LARGE (*)    Ketones, ur 20 (*)    Protein, ur 100 (*)    RBC / HPF >50 (*)    Bacteria, UA FEW (*)    All other components within normal limits  LIPASE, BLOOD  I-STAT BETA HCG BLOOD, ED ([facility], [facility], [facility] ONLY)    EKG None  Radiology No results found.  Procedures Procedures (including critical care time) CRITICAL CARE Performed by: Roxy Horseman   Total critical care time: 41 minutes  Critical care time was exclusive of separately billable procedures and treating other patients.  Critical care was necessary to treat or prevent imminent or life-threatening deterioration.  Critical care was  time spent personally by me on the following activities: development of treatment plan with patient and/or surrogate as  well as nursing, discussions with consultants, evaluation of patient's response to treatment, examination of patient, obtaining history from patient or surrogate, ordering and performing treatments and interventions, ordering and review of laboratory studies, ordering and review of radiographic studies, pulse oximetry and re-evaluation of patient's condition.  Medications Ordered in ED Medications  sodium chloride flush (NS) 0.9 % injection 3 mL (has no administration in time range)  metoCLOPramide (REGLAN) injection 10 mg (has no administration in time range)     Initial Impression / Assessment and Plan / ED Course  I have reviewed the triage vital signs and the nursing notes.  Pertinent labs & imaging results that were available during my care of the patient were reviewed by me and considered in my medical decision making (see chart for details).       Patient with history of right-sided ICA aneurysm, awaiting repair by Dr. [name].  Delay in repair secondary to COVID-19 pandemic.  Patient presents today with a whooshing sound in her head.  She states that she has also had some vomiting and left eye pain/fullness sensation.  She denies any vision changes or vision loss.  Patient denies any headache.  She is not anticoagulated.  Patient immediately seen by Dr. Madilyn Hookees, who agrees with plan for CT angios.   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  I discussed case with on-call neurosurgery nurse practitioner, who will discuss case with Dr. Wynetta Emeryram from neurosurgery.  Dr. Wynetta Emeryram recommends consulting with Dr. [name].  Case discussed with Dr. [name], who is scheduled to take care of the patient's right-sided ICA aneurysm, but has not yet formally seen the patient.  Appreciate telephone consultation, in which he recommends patient be admitted for diagnostic arteriogram.  He recommends that the hospitalist service prepare patient for the diagnostic arteriogram  and contact the neurointerventional team for formal consult later today.  Dr. [name] states that they might be able to take care of this today or tomorrow.   Final Clinical Impressions(s) / ED Diagnoses   Final diagnoses:  Carotid-cavernous fistula    ED Discharge Orders    None       Roxy HorsemanBrowning, Twisha Vanpelt, PA-C 10/20/18 0603    Tilden Fossaees, Elizabeth, MD 10/20/18 1131

## 2018-10-19 NOTE — Telephone Encounter (Signed)
Pt states that she has this wind tunnel whooshing sound going on in her ears. She states that she called the on call doctor and was advised to get some rest and stay hydrated. Pt states she was not able to rest last night. She states that this morning her R eye was swollen. Pt will call her PCP.

## 2018-10-20 ENCOUNTER — Emergency Department (HOSPITAL_COMMUNITY): Payer: 59

## 2018-10-20 ENCOUNTER — Inpatient Hospital Stay (HOSPITAL_COMMUNITY): Payer: 59

## 2018-10-20 ENCOUNTER — Other Ambulatory Visit: Payer: Self-pay

## 2018-10-20 DIAGNOSIS — Z1159 Encounter for screening for other viral diseases: Secondary | ICD-10-CM | POA: Diagnosis not present

## 2018-10-20 DIAGNOSIS — E876 Hypokalemia: Secondary | ICD-10-CM | POA: Diagnosis present

## 2018-10-20 DIAGNOSIS — E538 Deficiency of other specified B group vitamins: Secondary | ICD-10-CM | POA: Diagnosis present

## 2018-10-20 DIAGNOSIS — I10 Essential (primary) hypertension: Secondary | ICD-10-CM

## 2018-10-20 DIAGNOSIS — K2270 Barrett's esophagus without dysplasia: Secondary | ICD-10-CM | POA: Diagnosis present

## 2018-10-20 DIAGNOSIS — D509 Iron deficiency anemia, unspecified: Secondary | ICD-10-CM | POA: Diagnosis present

## 2018-10-20 DIAGNOSIS — H5704 Mydriasis: Secondary | ICD-10-CM | POA: Diagnosis present

## 2018-10-20 DIAGNOSIS — F419 Anxiety disorder, unspecified: Secondary | ICD-10-CM | POA: Diagnosis present

## 2018-10-20 DIAGNOSIS — Z882 Allergy status to sulfonamides status: Secondary | ICD-10-CM | POA: Diagnosis not present

## 2018-10-20 DIAGNOSIS — M109 Gout, unspecified: Secondary | ICD-10-CM | POA: Diagnosis not present

## 2018-10-20 DIAGNOSIS — M1A00X0 Idiopathic chronic gout, unspecified site, without tophus (tophi): Secondary | ICD-10-CM | POA: Diagnosis present

## 2018-10-20 DIAGNOSIS — K219 Gastro-esophageal reflux disease without esophagitis: Secondary | ICD-10-CM | POA: Diagnosis present

## 2018-10-20 DIAGNOSIS — L988 Other specified disorders of the skin and subcutaneous tissue: Secondary | ICD-10-CM | POA: Diagnosis not present

## 2018-10-20 DIAGNOSIS — K449 Diaphragmatic hernia without obstruction or gangrene: Secondary | ICD-10-CM | POA: Diagnosis present

## 2018-10-20 DIAGNOSIS — G43909 Migraine, unspecified, not intractable, without status migrainosus: Secondary | ICD-10-CM | POA: Diagnosis present

## 2018-10-20 DIAGNOSIS — I671 Cerebral aneurysm, nonruptured: Secondary | ICD-10-CM | POA: Diagnosis present

## 2018-10-20 DIAGNOSIS — Z902 Acquired absence of lung [part of]: Secondary | ICD-10-CM | POA: Diagnosis not present

## 2018-10-20 DIAGNOSIS — H93A2 Pulsatile tinnitus, left ear: Secondary | ICD-10-CM | POA: Diagnosis present

## 2018-10-20 DIAGNOSIS — D72829 Elevated white blood cell count, unspecified: Secondary | ICD-10-CM | POA: Diagnosis not present

## 2018-10-20 DIAGNOSIS — Z881 Allergy status to other antibiotic agents status: Secondary | ICD-10-CM | POA: Diagnosis not present

## 2018-10-20 DIAGNOSIS — Z885 Allergy status to narcotic agent status: Secondary | ICD-10-CM | POA: Diagnosis not present

## 2018-10-20 DIAGNOSIS — F329 Major depressive disorder, single episode, unspecified: Secondary | ICD-10-CM | POA: Diagnosis present

## 2018-10-20 DIAGNOSIS — J449 Chronic obstructive pulmonary disease, unspecified: Secondary | ICD-10-CM | POA: Diagnosis present

## 2018-10-20 DIAGNOSIS — Z86718 Personal history of other venous thrombosis and embolism: Secondary | ICD-10-CM | POA: Diagnosis not present

## 2018-10-20 DIAGNOSIS — Z86711 Personal history of pulmonary embolism: Secondary | ICD-10-CM | POA: Diagnosis not present

## 2018-10-20 DIAGNOSIS — M7989 Other specified soft tissue disorders: Secondary | ICD-10-CM | POA: Diagnosis not present

## 2018-10-20 HISTORY — PX: IR ANGIO INTRA EXTRACRAN SEL COM CAROTID INNOMINATE BILAT MOD SED: IMG5360

## 2018-10-20 HISTORY — PX: IR ANGIO VERTEBRAL SEL SUBCLAVIAN INNOMINATE UNI R MOD SED: IMG5365

## 2018-10-20 HISTORY — PX: IR ANGIO VERTEBRAL SEL VERTEBRAL UNI L MOD SED: IMG5367

## 2018-10-20 LAB — BASIC METABOLIC PANEL
Anion gap: 16 — ABNORMAL HIGH (ref 5–15)
BUN: 13 mg/dL (ref 6–20)
CO2: 17 mmol/L — ABNORMAL LOW (ref 22–32)
Calcium: 9.4 mg/dL (ref 8.9–10.3)
Chloride: 105 mmol/L (ref 98–111)
Creatinine, Ser: 0.8 mg/dL (ref 0.44–1.00)
GFR calc Af Amer: >60 mL/min (ref >60)
GFR calc non Af Amer: >60 mL/min (ref >60)
Glucose, Bld: 108 mg/dL — ABNORMAL HIGH (ref 70–99)
Potassium: 3.5 mmol/L (ref 3.5–5.1)
Sodium: 138 mmol/L (ref 135–145)

## 2018-10-20 MED ORDER — ACETAMINOPHEN 650 MG RE SUPP: 650.0000 mg | Freq: Four times a day (QID) | RECTAL | Status: DC | PRN

## 2018-10-20 MED ORDER — DEXTROSE IN LACTATED RINGERS 5 % IV SOLN
INTRAVENOUS | Status: DC
  Administered 2018-10-20: 21:00:00 via INTRAVENOUS

## 2018-10-20 MED ORDER — SODIUM CHLORIDE 0.9 % IV SOLN
INTRAVENOUS | Status: AC
  Administered 2018-10-20: 16:00:00 via INTRAVENOUS

## 2018-10-20 MED ORDER — ONDANSETRON HCL 4 MG/2ML IJ SOLN
INTRAMUSCULAR | Status: AC
  Filled 2018-10-20: qty 2

## 2018-10-20 MED ORDER — IOHEXOL 350 MG/ML SOLN
100.0000 mL | Freq: Once | INTRAVENOUS | Status: AC | PRN
  Administered 2018-10-20: 100 mL via INTRAVENOUS

## 2018-10-20 MED ORDER — SODIUM CHLORIDE 0.9 % IV SOLN
INTRAVENOUS | Status: DC
  Administered 2018-10-20: 06:00:00 via INTRAVENOUS

## 2018-10-20 MED ORDER — FENTANYL CITRATE (PF) 100 MCG/2ML IJ SOLN
INTRAMUSCULAR | Status: AC
  Filled 2018-10-20: qty 2

## 2018-10-20 MED ORDER — IOHEXOL 300 MG/ML  SOLN
80.0000 mL | Freq: Once | INTRAMUSCULAR | Status: AC | PRN
  Administered 2018-10-20: 80 mL via INTRA_ARTERIAL

## 2018-10-20 MED ORDER — FENTANYL CITRATE (PF) 100 MCG/2ML IJ SOLN
INTRAMUSCULAR | Status: AC | PRN
  Administered 2018-10-20: 25 ug via INTRAVENOUS

## 2018-10-20 MED ORDER — HYDROCHLOROTHIAZIDE 12.5 MG PO CAPS
12.5000 mg | ORAL_CAPSULE | Freq: Every day | ORAL | Status: DC
  Administered 2018-10-20 – 2018-10-25 (×5): 12.5 mg via ORAL
  Filled 2018-10-20 (×6): qty 1

## 2018-10-20 MED ORDER — ONDANSETRON HCL 4 MG PO TABS: 4.0000 mg | ORAL_TABLET | Freq: Four times a day (QID) | ORAL | Status: DC | PRN

## 2018-10-20 MED ORDER — MIDAZOLAM HCL 2 MG/2ML IJ SOLN
INTRAMUSCULAR | Status: AC
  Filled 2018-10-20: qty 2

## 2018-10-20 MED ORDER — LIDOCAINE HCL 1 % IJ SOLN
INTRAMUSCULAR | Status: AC | PRN
  Administered 2018-10-20: 10 mL

## 2018-10-20 MED ORDER — POTASSIUM CHLORIDE CRYS ER 20 MEQ PO TBCR: 40.0000 meq | EXTENDED_RELEASE_TABLET | Freq: Once | ORAL | Status: DC

## 2018-10-20 MED ORDER — HYDRALAZINE HCL 20 MG/ML IJ SOLN
5.0000 mg | INTRAMUSCULAR | Status: DC | PRN
  Administered 2018-10-22 (×2): 5 mg via INTRAVENOUS
  Administered 2018-10-28: 10 mg via INTRAVENOUS
  Filled 2018-10-20 (×3): qty 1

## 2018-10-20 MED ORDER — PROMETHAZINE HCL 25 MG/ML IJ SOLN
6.2500 mg | Freq: Four times a day (QID) | INTRAMUSCULAR | Status: DC | PRN
  Administered 2018-10-20 – 2018-10-25 (×7): 6.25 mg via INTRAVENOUS
  Filled 2018-10-20 (×8): qty 1

## 2018-10-20 MED ORDER — ONDANSETRON HCL 4 MG/2ML IJ SOLN: 4.0000 mg | Freq: Four times a day (QID) | INTRAMUSCULAR | Status: DC | PRN

## 2018-10-20 MED ORDER — MIDAZOLAM HCL 2 MG/2ML IJ SOLN
INTRAMUSCULAR | Status: AC | PRN
  Administered 2018-10-20: 1 mg via INTRAVENOUS

## 2018-10-20 MED ORDER — LIDOCAINE HCL 1 % IJ SOLN
INTRAMUSCULAR | Status: AC
  Filled 2018-10-20: qty 20

## 2018-10-20 MED ORDER — HYDRALAZINE HCL 20 MG/ML IJ SOLN
INTRAMUSCULAR | Status: AC | PRN
  Administered 2018-10-20: 10 mg via INTRAVENOUS

## 2018-10-20 MED ORDER — POTASSIUM CHLORIDE 10 MEQ/100ML IV SOLN
10.0000 meq | INTRAVENOUS | Status: AC
  Administered 2018-10-20 (×3): 10 meq via INTRAVENOUS
  Filled 2018-10-20 (×2): qty 100

## 2018-10-20 MED ORDER — HEPARIN SODIUM (PORCINE) 1000 UNIT/ML IJ SOLN
INTRAMUSCULAR | Status: AC | PRN
  Administered 2018-10-20: 500 mL via INTRAVENOUS

## 2018-10-20 MED ORDER — POTASSIUM CHLORIDE CRYS ER 20 MEQ PO TBCR
40.0000 meq | EXTENDED_RELEASE_TABLET | Freq: Once | ORAL | Status: AC
  Administered 2018-10-20: 40 meq via ORAL
  Filled 2018-10-20: qty 2

## 2018-10-20 MED ORDER — DIAZEPAM 5 MG PO TABS
5.0000 mg | ORAL_TABLET | Freq: Two times a day (BID) | ORAL | Status: DC | PRN
  Administered 2018-10-20 – 2018-11-01 (×12): 5 mg via ORAL
  Filled 2018-10-20 (×12): qty 1

## 2018-10-20 MED ORDER — HEPARIN SODIUM (PORCINE) 1000 UNIT/ML IJ SOLN
INTRAMUSCULAR | Status: AC
  Filled 2018-10-20: qty 1

## 2018-10-20 MED ORDER — HYDRALAZINE HCL 20 MG/ML IJ SOLN
INTRAMUSCULAR | Status: AC
  Filled 2018-10-20: qty 1

## 2018-10-20 MED ORDER — ACETAMINOPHEN 325 MG PO TABS
650.0000 mg | ORAL_TABLET | Freq: Four times a day (QID) | ORAL | Status: DC | PRN
  Administered 2018-10-22 – 2018-10-27 (×4): 650 mg via ORAL
  Filled 2018-10-20 (×4): qty 2

## 2018-10-20 NOTE — ED Notes (Signed)
Sister, Bonita Quin, would like an update as soon as possible at [phone]

## 2018-10-20 NOTE — ED Notes (Signed)
ED TO INPATIENT HANDOFF REPORT  ED Nurse Name and Phone #:Kanda Deluna [phone]   S Name/Age/Gender [name] 48 y.o. female Room/Bed: 017C/017C  Code Status   Code Status: Full Code  Home/SNF/Other Home Is this baseline? Yes  Triage Complete: Triage complete  Chief Complaint Hearing noises in her head, puffy L eye with pain  Triage Note Pt reports she has had a "whooshing sound" in her head since last nigh. 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 Pt reports noticing her L eye was puffy this am. Pt reports nausea, dry heaves, and vomiting. Pt reports talking to her PCP, prescribed phenergan, took one at noon today with no relief. Pt denies fever, cough, sick contact, or recent travel.    Allergies Allergies  Allergen Reactions  . Guaifenesin Shortness Of Breath  . Iron Anaphylaxis and Other (See Comments)    IV only  . Celexa [Citalopram Hydrobromide]     HAs  . Codeine Other (See Comments)    tiredness  . Doxepin Hives  . Hydromorphone Hcl Nausea And Vomiting  . Metoprolol Succinate Other (See Comments)    REACTION: edema  . Morphine Other (See Comments)    Does not work; hallucinations and irritable  . Pantoprazole Sodium Other (See Comments)    heartburn  . Pseudoeph-Doxylamine-Dm-Apap Hives  . Pseudoephedrine Hives  . Sulfa Antibiotics Hives  . Prednisone Rash  . 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 Allergic rhinitis   . Anxiety   . B12 deficiency   . Barrett's esophagus   . Chronic constipation   . 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 Depression   . GERD (gastroesophageal reflux disease)   . Hiatal hernia   . History of DVT of lower extremity    08-14-2003  RIGHT LOWER EXTREMITY-- TREATED W/ COUMADIN----  per pt none since  . History of palpitations   . Hypertension   . Idiopathic chronic gout    12-20-2017  per pt stable ,   last episode 02/ 2019  . Iron deficiency anemia   . Migraines   . Personal history of PE (pulmonary embolism)    03/ 2005 RIGHT SIDE  --- treated w/ coumadin---  per pt none since  . PONV (postoperative nausea and vomiting)   . Tinea versicolor   . Vitamin D deficiency    Past Surgical History:  Procedure Laterality Date  . BIOPSY  11/29/2017   Procedure: BIOPSY;  Surgeon: [name], MD;  Location: [facility];  Service: Endoscopy;;  . BRONCHOSCOPY  05-20-2004  dr clance  . ESOPHAGOGASTRODUODENOSCOPY (EGD) WITH PROPOFOL N/A 11/29/2017   Procedure: ESOPHAGOGASTRODUODENOSCOPY (EGD) WITH PROPOFOL;  Surgeon: [name], MD;  Location: [facility];  Service: Endoscopy;  Laterality: N/A;  . EXPLORATORY LAPAROTOMY  AGE 33    [facility]   for constipation  . INSERTION OF MESH N/A 12/26/2017   Procedure: INSERTION OF BIO A MESH;  Surgeon: [name], MD;  Location: [facility]S;  Service: General;  Laterality: N/A;  . KNEE ARTHROSCOPY W/ ACL RECONSTRUCTION Right 09/2003  . LAPAROSCOPY BILATERAL TUBAL FULGERATION AND ATTEMPTED THERMACHOICE ABLATION WITH UTERINE PERFORATION  07-17-2009   dr [name]  [facility]  . LUNG LOBECTOMY  1991   right lower lobe for massive hemoptyosis  . MINI-THORACOTOMY WEDGE RESECTION OF LEFT LOWER LOBE CYST Left 06-24-2004   dr Edwyna Shell [facility]   hemoptyosis     A IV Location/Drains/Wounds Patient  Lines/Drains/Airways Status   Active Line/Drains/Airways    Name:   Placement date:   Placement time:   Site:   Days:   Peripheral IV 10/20/18 Right;Upper Wrist   10/20/18    0002    Wrist   less than 1   Peripheral IV 10/20/18 Left Forearm   10/20/18    0038    Forearm   less than 1   Incision (Closed) 12/26/17 Abdomen Other (Comment)   12/26/17    1005     298   Incision - 6 Ports Abdomen Right;Lateral Right;Medial Mid;Upper Left Left;Lateral Left   12/26/17    0815     298          Intake/Output Last 24 hours No intake or output data in the 24 hours ending 10/20/18 1884  Labs/Imaging Results for orders placed or performed during the hospital encounter of 10/19/18 (from the past 48 hour(s))  Lipase, blood     Status: None   Collection Time: 10/19/18  6:37 PM  Result Value Ref Range   Lipase 23 11 - 51 U/L    Comment: Performed at [facility], [address] [place], [place] [address]  Comprehensive metabolic panel     Status: Abnormal   Collection Time: 10/19/18  6:37 PM  Result Value Ref Range   Sodium 140 135 - 145 mmol/L   Potassium 3.0 (L) 3.5 - 5.1 mmol/L   Chloride 100 98 - 111 mmol/L   CO2 22 22 - 32 mmol/L   Glucose, Bld 123 (H) 70 - 99 mg/dL   BUN 11 6 - 20 mg/dL   Creatinine, Ser 3.01 0.44 - 1.00 mg/dL   Calcium 60.1 (H) 8.9 - 10.3 mg/dL   Total Protein 8.6 (H) 6.5 - 8.1 g/dL   Albumin 5.2 (H) 3.5 - 5.0 g/dL   AST 25 15 - 41 U/L   ALT 12 0 - 44 U/L   Alkaline Phosphatase 57 38 - 126 U/L   Total Bilirubin 1.4 (H) 0.3 - 1.2 mg/dL   GFR calc non Af Amer >60 >60 mL/min   GFR calc Af Amer >60 >60 mL/min   Anion gap 18 (H) 5 - 15    Comment: Performed at [facility], [address], [place], [place] [address]  CBC     Status: Abnormal   Collection Time: 10/19/18  6:37 PM  Result Value Ref Range   WBC 11.9 (H) 4.0 - 10.5 K/uL   RBC 5.58 (H) 3.87 - 5.11 MIL/uL   Hemoglobin 16.5 (H) 12.0 - 15.0 g/dL   HCT 55.7 (H) 32.2 - 02.5 %   MCV 86.6 80.0 -  100.0 fL   MCH 29.6 26.0 - 34.0 pg   MCHC 34.2 30.0 - 36.0 g/dL   RDW 42.7 06.2 - 37.6 %  Platelets 350 150 - 400 K/uL   nRBC 0.0 0.0 - 0.2 %    Comment: Performed at [facility], [address] [place], [place] [address]  I-Stat beta hCG blood, ED     Status: None   Collection Time: 10/19/18  6:43 PM  Result Value Ref Range   I-stat hCG, quantitative <5.0 <5 mIU/mL   Comment 3            Comment:   GEST. AGE      CONC.  (mIU/mL)   <=1 WEEK        5 - 50     2 WEEKS       50 - 500     3 WEEKS       100 - 10,000     4 WEEKS     1,000 - 30,000        FEMALE AND NON-PREGNANT FEMALE:     LESS THAN 5 mIU/mL   Urinalysis, Routine w reflex microscopic     Status: Abnormal   Collection Time: 10/19/18  7:37 PM  Result Value Ref Range   Color, Urine YELLOW YELLOW   APPearance CLOUDY (A) CLEAR   Specific Gravity, Urine 1.014 1.005 - 1.030   pH 8.0 5.0 - 8.0   Glucose, UA NEGATIVE NEGATIVE mg/dL   Hgb urine dipstick LARGE (A) NEGATIVE   Bilirubin Urine NEGATIVE NEGATIVE   Ketones, ur 20 (A) NEGATIVE mg/dL   Protein, ur 191 (A) NEGATIVE mg/dL   Nitrite NEGATIVE NEGATIVE   Leukocytes,Ua NEGATIVE NEGATIVE   RBC / HPF >50 (H) 0 - 5 RBC/hpf   WBC, UA 0-5 0 - 5 WBC/hpf   Bacteria, UA FEW (A) NONE SEEN   Squamous Epithelial / LPF 0-5 0 - 5   Mucus PRESENT     Comment: Performed at [facility], [address], [place], [place] [address]   Ct Angio Head W Or Wo Contrast  Result Date: 10/20/2018 CLINICAL DATA:  Initial evaluation for acute headache. EXAM: CT ANGIOGRAPHY HEAD AND NECK TECHNIQUE: Multidetector CT imaging of the head and neck was performed using the standard protocol during bolus administration of intravenous contrast. Multiplanar CT image reconstructions and MIPs were obtained to evaluate the vascular anatomy. Carotid stenosis measurements (when applicable) are obtained utilizing NASCET criteria, using the distal internal carotid diameter as the denominator.  CONTRAST:  OMNIPAQUE IOHEXOL 350 MG/ML SOLN COMPARISON:  Prior CTA from 06/29/2018. FINDINGS: CT HEAD FINDINGS Brain: Cerebral volume stable, and within normal limits. No acute intracranial hemorrhage. No acute large vessel territory infarct. No mass lesion, midline shift or mass effect. No hydrocephalus. No extra-axial fluid collection. Vascular: No asymmetric hyperdense vessel. Skull: Scalp soft tissues demonstrate no acute finding. Calvarium intact. Sinuses: Air-fluid level noted within the dominant left sphenoid sinus. Paranasal sinuses are otherwise clear. Mastoid air cells and middle ear cavities are well pneumatized and free of fluid. Soft tissue density within the left EAC likely reflects cerumen. Orbits: Asymmetric soft tissue stranding seen involving the intraconal fat of the left orbit (series 5, image 118). Left lateral rectus muscle appears enlarged, new from previous. Globes remain symmetric in size with normal morphology and appearance. Review of the MIP images confirms the above findings CTA NECK FINDINGS Aortic arch: Visualized aortic arch normal in caliber with normal 3 vessel morphology. Minimal plaque at the origin of the left subclavian artery. No flow-limiting stenosis about the origin of the great vessels. Subclavian arteries otherwise widely  patent. Right carotid system: Right common carotid artery widely patent from its origin to the bifurcation without abnormality or stenosis. No atheromatous narrowing about the right carotid bifurcation. Multiple segments of ectasias again seen involving the mid-distal right ICA measuring up to 9 mm in maximal diameter, similar to previous. Superimposed saccular aneurysm at the distal right ICA just proximal to the skull base relatively stable measuring 8 x 7 x 8 mm (transverse by craniocaudad by AP, series 12, image 111). No dissection or stenosis. Left carotid system: Left common carotid artery patent from its origin to the bifurcation without  abnormality. No atheromatous narrowing about the left bifurcation. Aneurysmal dilatation of the distal left ICA up to 12 mm, relatively similar. No dissection or stenosis. Vertebral arteries: Both of the vertebral arteries arise from the subclavian arteries. Left vertebral artery dominant. No evidence for dissection or flow-limiting stenosis. Focal ectasia up at the level of C2 measuring up to 6 mm on the left and 5 mm on the right, stable. Skeleton: Mild multilevel cervical spondylolysis without significant spinal stenosis, stable. Other neck: No other acute soft tissue abnormality within the neck. Few subcentimeter hypodense thyroid nodules noted, of doubtful significance. Upper chest: Visualized upper chest demonstrates no acute finding. Review of the MIP images confirms the above findings CTA HEAD FINDINGS Anterior circulation: Petrous segments patent bilaterally. Cavernous/supraclinoid ICAs patent without flow-limiting stenosis or definite abnormality. Adjacent venous contamination within the cavernous sinus limits evaluation. There is asymmetric engorgement and filling of the left superior orbital vein, raising the possibility for CC fistula (series 12, image 71). Additionally, there is asymmetric filling of the left inferior petrosal sinus. A1 segments patent bilaterally. Normal anterior communicating artery. Anterior cerebral arteries well perfused to their distal aspects. M1 segments patent bilaterally. Distal MCA branches well perfused and symmetric. Posterior circulation: Left vertebral artery ectatic measuring up to 5 mm, similar to previous. Right vertebral artery diffusely hypoplastic. Posterior inferior cerebral arteries patent proximally. Basilar patent to its distal aspect. Superior cerebral arteries perfuse bilaterally. Both of the PCAs patent to their distal aspects without stenosis. Venous sinuses: Concern for left-sided CC fistula or possibly dural AVF as above. Venous sinuses otherwise grossly  patent allowing for timing of the contrast bolus. Anatomic variants: Tiny right posterior communicating artery noted. No other significant anatomic variant. No appreciable aneurysm. Delayed phase: No abnormal enhancement. Review of the MIP images confirms the above findings IMPRESSION: 1. Abnormal fat stranding involving the intraconal fat of the left orbit with associated extraocular muscle enlargement and engorgement of the left superior orbital vein. Findings concerning for possible cavernous carotid fistula or other arteriovenous shunt about the cavernous sinus. Further assessment with dedicated catheter directed arteriogram recommended for further evaluation. 2. Otherwise stable CTA with multiple segments of ectasia involving the carotid and vertebral arteries bilaterally. 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 Again, findings may reflect fibromuscular dysplasia or underlying connective tissue disorder. 3. No large vessel occlusion, arterial dissection, or hemodynamically significant stenosis identified. 4. No other acute intracranial abnormality. Electronically Signed   By: Rise Mu M.D.   On: 10/20/2018 02:29   Ct Angio Neck W And/or Wo Contrast  Result Date: 10/20/2018 CLINICAL DATA:  Initial evaluation for acute headache. EXAM: CT ANGIOGRAPHY HEAD AND NECK TECHNIQUE: Multidetector CT imaging of the head and neck was performed using the standard protocol during bolus administration of intravenous contrast. Multiplanar CT image reconstructions and MIPs were obtained to evaluate the vascular  anatomy. Carotid stenosis measurements (when applicable) are obtained utilizing NASCET criteria, using the distal internal carotid diameter as the denominator. CONTRAST:  OMNIPAQUE IOHEXOL 350 MG/ML SOLN COMPARISON:  Prior CTA from 06/29/2018. FINDINGS: CT HEAD FINDINGS Brain: Cerebral volume stable, and within normal limits. No  acute intracranial hemorrhage. No acute large vessel territory infarct. No mass lesion, midline shift or mass effect. No hydrocephalus. No extra-axial fluid collection. Vascular: No asymmetric hyperdense vessel. Skull: Scalp soft tissues demonstrate no acute finding. Calvarium intact. Sinuses: Air-fluid level noted within the dominant left sphenoid sinus. Paranasal sinuses are otherwise clear. Mastoid air cells and middle ear cavities are well pneumatized and free of fluid. Soft tissue density within the left EAC likely reflects cerumen. Orbits: Asymmetric soft tissue stranding seen involving the intraconal fat of the left orbit (series 5, image 118). Left lateral rectus muscle appears enlarged, new from previous. Globes remain symmetric in size with normal morphology and appearance. Review of the MIP images confirms the above findings CTA NECK FINDINGS Aortic arch: Visualized aortic arch normal in caliber with normal 3 vessel morphology. Minimal plaque at the origin of the left subclavian artery. No flow-limiting stenosis about the origin of the great vessels. Subclavian arteries otherwise widely patent. Right carotid system: Right common carotid artery widely patent from its origin to the bifurcation without abnormality or stenosis. No atheromatous narrowing about the right carotid bifurcation. Multiple segments of ectasias again seen involving the mid-distal right ICA measuring up to 9 mm in maximal diameter, similar to previous. Superimposed saccular aneurysm at the distal right ICA just proximal to the skull base relatively stable measuring 8 x 7 x 8 mm (transverse by craniocaudad by AP, series 12, image 111). No dissection or stenosis. Left carotid system: Left common carotid artery patent from its origin to the bifurcation without abnormality. No atheromatous narrowing about the left bifurcation. Aneurysmal dilatation of the distal left ICA up to 12 mm, relatively similar. No dissection or stenosis. Vertebral  arteries: Both of the vertebral arteries arise from the subclavian arteries. Left vertebral artery dominant. No evidence for dissection or flow-limiting stenosis. Focal ectasia up at the level of C2 measuring up to 6 mm on the left and 5 mm on the right, stable. Skeleton: Mild multilevel cervical spondylolysis without significant spinal stenosis, stable. Other neck: No other acute soft tissue abnormality within the neck. Few subcentimeter hypodense thyroid nodules noted, of doubtful significance. Upper chest: Visualized upper chest demonstrates no acute finding. Review of the MIP images confirms the above findings CTA HEAD FINDINGS Anterior circulation: Petrous segments patent bilaterally. Cavernous/supraclinoid ICAs patent without flow-limiting stenosis or definite abnormality. Adjacent venous contamination within the cavernous sinus limits evaluation. There is asymmetric engorgement and filling of the left superior orbital vein, raising the possibility for CC fistula (series 12, image 71). Additionally, there is asymmetric filling of the left inferior petrosal sinus. A1 segments patent bilaterally. Normal anterior communicating artery. Anterior cerebral arteries well perfused to their distal aspects. M1 segments patent bilaterally. Distal MCA branches well perfused and symmetric. Posterior circulation: Left vertebral artery ectatic measuring up to 5 mm, similar to previous. Right vertebral artery diffusely hypoplastic. Posterior inferior cerebral arteries patent proximally. Basilar patent to its distal aspect. Superior cerebral arteries perfuse bilaterally. Both of the PCAs patent to their distal aspects without stenosis. Venous sinuses: Concern for left-sided CC fistula or possibly dural AVF as above. Venous sinuses otherwise grossly patent allowing for timing of the contrast bolus. Anatomic variants: Tiny right posterior communicating artery noted.  No other significant anatomic variant. No appreciable aneurysm.  Delayed phase: No abnormal enhancement. Review of the MIP images confirms the above findings IMPRESSION: 1. Abnormal fat stranding involving the intraconal fat of the left orbit with associated extraocular muscle enlargement and engorgement of the left superior orbital vein. Findings concerning for possible cavernous carotid fistula or other arteriovenous shunt about the cavernous sinus. Further assessment with dedicated catheter directed arteriogram recommended for further evaluation. 2. Otherwise stable CTA with multiple segments of ectasia involving the carotid and vertebral arteries bilaterally. 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 Again, findings may reflect fibromuscular dysplasia or underlying connective tissue disorder. 3. No large vessel occlusion, arterial dissection, or hemodynamically significant stenosis identified. 4. No other acute intracranial abnormality. Electronically Signed   By: Rise MuBenjamin  McClintock M.D.   On: 10/20/2018 02:29   Dg Chest Port 1 View  Result Date: 10/20/2018 CLINICAL DATA:  Hypercalcemia. EXAM: PORTABLE CHEST 1 VIEW COMPARISON:  CT chest and chest x-ray dated July 11, 2018. FINDINGS: The heart size and mediastinal contours are within normal limits. Normal pulmonary vascularity. Chronic pleural thickening at the bilateral costophrenic angles is unchanged. No focal consolidation, pleural effusion, or pneumothorax. No acute osseous abnormality. IMPRESSION: No active disease. Electronically Signed   By: Obie DredgeWilliam T Derry M.D.   On: 10/20/2018 05:55    Pending Labs Unresulted Labs (From admission, onward)    Start     Ordered   10/21/18 0500  Basic metabolic panel  Tomorrow morning,   R     10/20/18 0537   10/20/18 0524  PTH, intact and calcium  Once,   R     10/20/18 0525   10/20/18 0524  PTH-related peptide  Once,   R     10/20/18 0525          Vitals/Pain Today's Vitals   10/20/18 0400  10/20/18 0430 10/20/18 0515 10/20/18 0610  BP: (!) 133/97 (!) 138/96 (!) 146/107   Pulse: 79 75 89   Resp:      Temp:      TempSrc:      SpO2: 97% 96% 97%   Weight:      Height:      PainSc:    7     Isolation Precautions No active isolations  Medications Medications  acetaminophen (TYLENOL) tablet 650 mg (has no administration in time range)    Or  acetaminophen (TYLENOL) suppository 650 mg (has no administration in time range)  ondansetron (ZOFRAN) tablet 4 mg (has no administration in time range)    Or  ondansetron (ZOFRAN) injection 4 mg (has no administration in time range)  potassium chloride 10 mEq in 100 mL IVPB (10 mEq Intravenous New Bag/Given 10/20/18 0607)  0.9 %  sodium chloride infusion ( Intravenous New Bag/Given 10/20/18 0606)  hydrALAZINE (APRESOLINE) injection 5-10 mg (has no administration in time range)  sodium chloride flush (NS) 0.9 % injection 3 mL (3 mLs Intravenous Given 10/20/18 0006)  metoCLOPramide (REGLAN) injection 10 mg (10 mg Intravenous Given 10/20/18 0003)  iohexol (OMNIPAQUE) 350 MG/ML injection 100 mL (100 mLs Intravenous Contrast Given 10/20/18 0051)    Mobility walks Low fall risk   Focused Assessments    R Recommendations: See Admitting Provider Note  Report given to:   Additional Notes: patient alert and oriented.  C/O pain in left eye with swelling and bruising noted.  Ambulatory.  Receiving Potassium run at 3075ml/hr.  Slowed rate down  per patient request due to sensitivity to medication.

## 2018-10-20 NOTE — Progress Notes (Signed)
Chief Complaint: Patient was seen in consultation today for cerebral angiogram at the request of Dr. Lyda PeroneJared Gardner  Referring Physician(s): Dr. Lyda PeroneJared Gardner  Supervising Physician: [name], [name]  [facility]  History of Present Illness: [name] is a 48 y.o. [facility] aneurysm. She was to be scheduled for angiogram once restrictions lifted, however, she developed 'whooshing' sound on left side of her head a few days ago. This was followed by development of nausea and eventual vomiting episodes. She has also developed pain/fullness in her left orbit as well as conjunctival and periorbital ecchymosis. She was sent to the ER for evaluation. 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 Findings concerning for possible cavernous carotid fistula or other arteriovenous shunt about thecavernous sinus. NIR has been consulted to assess and perform formal cerebral angiogram. PMHx, meds, labs, imaging, allergies reviewed. Has been NPO today as directed. Pt denies HA aside from orbital region. No double vision, no speech issues. Denies UE or LE weakness.  Past Medical History:  Diagnosis Date  . Allergic rhinitis   . Anxiety   . B12 deficiency   . Barrett's esophagus   . Chronic constipation   . 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 Depression   . GERD (gastroesophageal reflux disease)   . Hiatal hernia   . History of DVT of lower extremity    08-14-2003  RIGHT LOWER EXTREMITY-- TREATED W/ COUMADIN----  per pt none since  . History of palpitations   . Hypertension   . Idiopathic chronic gout    12-20-2017  per pt stable ,   last episode 02/ 2019  . Iron deficiency anemia   . Migraines   . Personal  history of PE (pulmonary embolism)    03/ 2005 RIGHT SIDE  --- treated w/ coumadin---  per pt none since  . PONV (postoperative nausea and vomiting)   . Tinea versicolor   . Vitamin D deficiency     Past Surgical History:  Procedure Laterality Date  . BIOPSY  11/29/2017   Procedure: BIOPSY;  Surgeon: Vida RiggerMagod, Marc, MD;  Location: [facility];  Service: Endoscopy;;  . BRONCHOSCOPY  05-20-2004  dr clance  . ESOPHAGOGASTRODUODENOSCOPY (EGD) WITH PROPOFOL N/A 11/29/2017   Procedure: ESOPHAGOGASTRODUODENOSCOPY (EGD) WITH PROPOFOL;  Surgeon: Vida RiggerMagod, Marc, MD;  Location: [facility];  Service: Endoscopy;  Laterality: N/A;  . EXPLORATORY LAPAROTOMY  AGE 63    [facility]   for constipation  . INSERTION OF MESH N/A 12/26/2017   Procedure: INSERTION OF BIO A MESH;  Surgeon: Axel Filleramirez, Armando, MD;  Location: [facility];  Service: General;  Laterality: N/A;  . KNEE ARTHROSCOPY W/ ACL RECONSTRUCTION Right 09/2003  . LAPAROSCOPY BILATERAL TUBAL FULGERATION AND ATTEMPTED THERMACHOICE ABLATION WITH UTERINE PERFORATION  07-17-2009   [facility]  . LUNG LOBECTOMY  1991   right lower lobe for massive hemoptyosis  . MINI-THORACOTOMY WEDGE RESECTION OF LEFT LOWER LOBE CYST Left 06-24-2004   [facility]   hemoptyosis    Allergies: Guaifenesin; Iron; Celexa [citalopram hydrobromide]; Codeine; Doxepin; Hydrocodone-acetaminophen; Hydromorphone hcl; Metoprolol succinate; Morphine; Pantoprazole sodium; Pseudoeph-doxylamine-dm-apap; Pseudoephedrine; Sulfa antibiotics; Prednisone; and Tetracyclines & related  Medications:  Current Facility-Administered Medications:  .  acetaminophen (TYLENOL) tablet 650 mg,  650 mg, Oral, Q6H PRN **OR** acetaminophen (TYLENOL) suppository 650 mg, 650 mg, Rectal, Q6H PRN, Hillary Bow, DO .  dextrose 5 % in lactated ringers infusion, , Intravenous, Continuous, Arrien, York Ram, MD .  diazepam (VALIUM) tablet 5 mg, 5 mg, Oral, Q12H PRN, Arrien, York Ram, MD .  fentaNYL  (SUBLIMAZE) 100 MCG/2ML injection, , , ,  .  heparin 1000 UNIT/ML injection, , , ,  .  hydrALAZINE (APRESOLINE) injection 5-10 mg, 5-10 mg, Intravenous, Q4H PRN, Julian Reil, Jared M, DO .  lidocaine (XYLOCAINE) 1 % (with pres) injection, , , ,  .  midazolam (VERSED) 2 MG/2ML injection, , , ,  .  ondansetron (ZOFRAN) 4 MG/2ML injection, , , ,  .  potassium chloride SA (K-DUR) CR tablet 40 mEq, 40 mEq, Oral, Once, Arrien, York Ram, MD .  promethazine (PHENERGAN) injection 6.25 mg, 6.25 mg, Intravenous, Q6H PRN, Arrien, York Ram, MD, 6.25 mg at 10/20/18 6256    Family History  Problem Relation Age of Onset  . Hypertension Other   . Diabetes Other     Social History   Socioeconomic History  . Marital status: Single    Spouse name: Not on file  . Number of children: Not on file  . Years of education: Not on file  . Highest education level: Not on file  Occupational History  . Not on file  Social Needs  . Financial resource strain: Not on file  . Food insecurity:    Worry: Not on file    Inability: Not on file  . Transportation needs:    Medical: Not on file    Non-medical: Not on file  Tobacco Use  . Smoking status: Never Smoker  . Smokeless tobacco: Never Used  Substance and Sexual Activity  . Alcohol use: Yes    Comment: rare  . Drug use: No  . Sexual activity: Not on file    Comment: BTL  Lifestyle  . Physical activity:    Days per week: Not on file    Minutes per session: Not on file  . Stress: Not on file  Relationships  . Social connections:    Talks on phone: Not on file    Gets together: Not on file    Attends religious service: Not on file    Active member of club or organization: Not on file    Attends meetings of clubs or organizations: Not on file    Relationship status: Not on file  Other Topics Concern  . Not on file  Social History Narrative  . Not on file     Review of Systems: A 12 point ROS discussed and pertinent positives are  indicated in the HPI above.  All other systems are negative.  Review of Systems  Vital Signs: BP (!) 149/97 (BP Location: Right Arm)   Pulse 92   Temp 99.1 F (37.3 C) (Oral)   Resp 13   Ht 5\' 7"  (1.702 m)   Wt 47.2 kg   SpO2 96%   BMI 16.30 kg/m   Physical Exam Constitutional:      Appearance: Normal appearance. She is not ill-appearing.  Eyes:     Extraocular Movements: Extraocular movements intact.     Pupils: Pupils are equal, round, and reactive to light.     Comments: Periorbital and conjunctival echcymosis on the left  Neck:     Musculoskeletal: Normal range of motion.     Vascular: No carotid bruit.  Cardiovascular:  Rate and Rhythm: Normal rate and regular rhythm.     Pulses: Normal pulses.     Heart sounds: Normal heart sounds.  Musculoskeletal: Normal range of motion.  Skin:    General: Skin is warm and dry.  Neurological:     General: No focal deficit present.     Mental Status: She is alert and oriented to person, place, and time. Mental status is at baseline.     Cranial Nerves: No cranial nerve deficit.     Sensory: No sensory deficit.     Motor: No weakness.     Coordination: Coordination normal.  Psychiatric:        Mood and Affect: Mood normal.        Judgment: Judgment normal.      Imaging: Ct Angio Head W Or Wo Contrast  Result Date: 10/20/2018 CLINICAL DATA:  Initial evaluation for acute headache. EXAM: CT ANGIOGRAPHY HEAD AND NECK TECHNIQUE: Multidetector CT imaging of the head and neck was performed using the standard protocol during bolus administration of intravenous contrast. Multiplanar CT image reconstructions and MIPs were obtained to evaluate the vascular anatomy. Carotid stenosis measurements (when applicable) are obtained utilizing NASCET criteria, using the distal internal carotid diameter as the denominator. CONTRAST:  OMNIPAQUE IOHEXOL 350 MG/ML SOLN COMPARISON:  Prior CTA from 06/29/2018. FINDINGS: CT HEAD FINDINGS Brain:  Cerebral volume stable, and within normal limits. No acute intracranial hemorrhage. No acute large vessel territory infarct. No mass lesion, midline shift or mass effect. No hydrocephalus. No extra-axial fluid collection. Vascular: No asymmetric hyperdense vessel. Skull: Scalp soft tissues demonstrate no acute finding. Calvarium intact. Sinuses: Air-fluid level noted within the dominant left sphenoid sinus. Paranasal sinuses are otherwise clear. Mastoid air cells and middle ear cavities are well pneumatized and free of fluid. Soft tissue density within the left EAC likely reflects cerumen. Orbits: Asymmetric soft tissue stranding seen involving the intraconal fat of the left orbit (series 5, image 118). Left lateral rectus muscle appears enlarged, new from previous. Globes remain symmetric in size with normal morphology and appearance. Review of the MIP images confirms the above findings CTA NECK FINDINGS Aortic arch: Visualized aortic arch normal in caliber with normal 3 vessel morphology. Minimal plaque at the origin of the left subclavian artery. No flow-limiting stenosis about the origin of the great vessels. Subclavian arteries otherwise widely patent. Right carotid system: Right common carotid artery widely patent from its origin to the bifurcation without abnormality or stenosis. No atheromatous narrowing about the right carotid bifurcation. Multiple segments of ectasias again seen involving the mid-distal right ICA measuring up to 9 mm in maximal diameter, similar to previous. Superimposed saccular aneurysm at the distal right ICA just proximal to the skull base relatively stable measuring 8 x 7 x 8 mm (transverse by craniocaudad by AP, series 12, image 111). No dissection or stenosis. Left carotid system: Left common carotid artery patent from its origin to the bifurcation without abnormality. No atheromatous narrowing about the left bifurcation. Aneurysmal dilatation of the distal left ICA up to 12 mm,  relatively similar. No dissection or stenosis. Vertebral arteries: Both of the vertebral arteries arise from the subclavian arteries. Left vertebral artery dominant. No evidence for dissection or flow-limiting stenosis. Focal ectasia up at the level of C2 measuring up to 6 mm on the left and 5 mm on the right, stable. Skeleton: Mild multilevel cervical spondylolysis without significant spinal stenosis, stable. Other neck: No other acute soft tissue abnormality within the neck. Few subcentimeter  hypodense thyroid nodules noted, of doubtful significance. Upper chest: Visualized upper chest demonstrates no acute finding. Review of the MIP images confirms the above findings CTA HEAD FINDINGS Anterior circulation: Petrous segments patent bilaterally. Cavernous/supraclinoid ICAs patent without flow-limiting stenosis or definite abnormality. Adjacent venous contamination within the cavernous sinus limits evaluation. There is asymmetric engorgement and filling of the left superior orbital vein, raising the possibility for CC fistula (series 12, image 71). Additionally, there is asymmetric filling of the left inferior petrosal sinus. A1 segments patent bilaterally. Normal anterior communicating artery. Anterior cerebral arteries well perfused to their distal aspects. M1 segments patent bilaterally. Distal MCA branches well perfused and symmetric. Posterior circulation: Left vertebral artery ectatic measuring up to 5 mm, similar to previous. Right vertebral artery diffusely hypoplastic. Posterior inferior cerebral arteries patent proximally. Basilar patent to its distal aspect. Superior cerebral arteries perfuse bilaterally. Both of the PCAs patent to their distal aspects without stenosis. Venous sinuses: Concern for left-sided CC fistula or possibly dural AVF as above. Venous sinuses otherwise grossly patent allowing for timing of the contrast bolus. Anatomic variants: Tiny right posterior communicating artery noted. No  other significant anatomic variant. No appreciable aneurysm. Delayed phase: No abnormal enhancement. Review of the MIP images confirms the above findings IMPRESSION: 1. Abnormal fat stranding involving the intraconal fat of the left orbit with associated extraocular muscle enlargement and engorgement of the left superior orbital vein. Findings concerning for possible cavernous carotid fistula or other arteriovenous shunt about the cavernous sinus. Further assessment with dedicated catheter directed arteriogram recommended for further evaluation. 2. Otherwise stable CTA with multiple segments of ectasia involving the carotid and vertebral arteries bilaterally. 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 Again, findings may reflect fibromuscular dysplasia or underlying connective tissue disorder. 3. No large vessel occlusion, arterial dissection, or hemodynamically significant stenosis identified. 4. No other acute intracranial abnormality. Electronically Signed   By: Rise Mu M.D.   On: 10/20/2018 02:29   Ct Angio Neck W And/or Wo Contrast  Result Date: 10/20/2018 CLINICAL DATA:  Initial evaluation for acute headache. EXAM: CT ANGIOGRAPHY HEAD AND NECK TECHNIQUE: Multidetector CT imaging of the head and neck was performed using the standard protocol during bolus administration of intravenous contrast. Multiplanar CT image reconstructions and MIPs were obtained to evaluate the vascular anatomy. Carotid stenosis measurements (when applicable) are obtained utilizing NASCET criteria, using the distal internal carotid diameter as the denominator. CONTRAST:  OMNIPAQUE IOHEXOL 350 MG/ML SOLN COMPARISON:  Prior CTA from 06/29/2018. FINDINGS: CT HEAD FINDINGS Brain: Cerebral volume stable, and within normal limits. No acute intracranial hemorrhage. No acute large vessel territory infarct. No mass lesion, midline shift or mass effect. No  hydrocephalus. No extra-axial fluid collection. Vascular: No asymmetric hyperdense vessel. Skull: Scalp soft tissues demonstrate no acute finding. Calvarium intact. Sinuses: Air-fluid level noted within the dominant left sphenoid sinus. Paranasal sinuses are otherwise clear. Mastoid air cells and middle ear cavities are well pneumatized and free of fluid. Soft tissue density within the left EAC likely reflects cerumen. Orbits: Asymmetric soft tissue stranding seen involving the intraconal fat of the left orbit (series 5, image 118). Left lateral rectus muscle appears enlarged, new from previous. Globes remain symmetric in size with normal morphology and appearance. Review of the MIP images confirms the above findings CTA NECK FINDINGS Aortic arch: Visualized aortic arch normal in caliber with normal 3 vessel morphology. Minimal plaque at the origin of the left subclavian  artery. No flow-limiting stenosis about the origin of the great vessels. Subclavian arteries otherwise widely patent. Right carotid system: Right common carotid artery widely patent from its origin to the bifurcation without abnormality or stenosis. No atheromatous narrowing about the right carotid bifurcation. Multiple segments of ectasias again seen involving the mid-distal right ICA measuring up to 9 mm in maximal diameter, similar to previous. Superimposed saccular aneurysm at the distal right ICA just proximal to the skull base relatively stable measuring 8 x 7 x 8 mm (transverse by craniocaudad by AP, series 12, image 111). No dissection or stenosis. Left carotid system: Left common carotid artery patent from its origin to the bifurcation without abnormality. No atheromatous narrowing about the left bifurcation. Aneurysmal dilatation of the distal left ICA up to 12 mm, relatively similar. No dissection or stenosis. Vertebral arteries: Both of the vertebral arteries arise from the subclavian arteries. Left vertebral artery dominant. No evidence  for dissection or flow-limiting stenosis. Focal ectasia up at the level of C2 measuring up to 6 mm on the left and 5 mm on the right, stable. Skeleton: Mild multilevel cervical spondylolysis without significant spinal stenosis, stable. Other neck: No other acute soft tissue abnormality within the neck. Few subcentimeter hypodense thyroid nodules noted, of doubtful significance. Upper chest: Visualized upper chest demonstrates no acute finding. Review of the MIP images confirms the above findings CTA HEAD FINDINGS Anterior circulation: Petrous segments patent bilaterally. Cavernous/supraclinoid ICAs patent without flow-limiting stenosis or definite abnormality. Adjacent venous contamination within the cavernous sinus limits evaluation. There is asymmetric engorgement and filling of the left superior orbital vein, raising the possibility for CC fistula (series 12, image 71). Additionally, there is asymmetric filling of the left inferior petrosal sinus. A1 segments patent bilaterally. Normal anterior communicating artery. Anterior cerebral arteries well perfused to their distal aspects. M1 segments patent bilaterally. Distal MCA branches well perfused and symmetric. Posterior circulation: Left vertebral artery ectatic measuring up to 5 mm, similar to previous. Right vertebral artery diffusely hypoplastic. Posterior inferior cerebral arteries patent proximally. Basilar patent to its distal aspect. Superior cerebral arteries perfuse bilaterally. Both of the PCAs patent to their distal aspects without stenosis. Venous sinuses: Concern for left-sided CC fistula or possibly dural AVF as above. Venous sinuses otherwise grossly patent allowing for timing of the contrast bolus. Anatomic variants: Tiny right posterior communicating artery noted. No other significant anatomic variant. No appreciable aneurysm. Delayed phase: No abnormal enhancement. Review of the MIP images confirms the above findings IMPRESSION: 1. Abnormal fat  stranding involving the intraconal fat of the left orbit with associated extraocular muscle enlargement and engorgement of the left superior orbital vein. Findings concerning for possible cavernous carotid fistula or other arteriovenous shunt about the cavernous sinus. Further assessment with dedicated catheter directed arteriogram recommended for further evaluation. 2. Otherwise stable CTA with multiple segments of ectasia involving the carotid and vertebral arteries bilaterally. 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 Again, findings may reflect fibromuscular dysplasia or underlying connective tissue disorder. 3. No large vessel occlusion, arterial dissection, or hemodynamically significant stenosis identified. 4. No other acute intracranial abnormality. Electronically Signed   By: Rise Mu M.D.   On: 10/20/2018 02:29   Dg Chest Port 1 View  Result Date: 10/20/2018 CLINICAL DATA:  Hypercalcemia. EXAM: PORTABLE CHEST 1 VIEW COMPARISON:  CT chest and chest x-ray dated July 11, 2018. FINDINGS: The heart size and mediastinal contours are within normal limits. Normal pulmonary vascularity.  Chronic pleural thickening at the bilateral costophrenic angles is unchanged. No focal consolidation, pleural effusion, or pneumothorax. No acute osseous abnormality. IMPRESSION: No active disease. Electronically Signed   By: Obie Dredge M.D.   On: 10/20/2018 05:55    Labs:  CBC: Recent Labs    06/15/18 0415 06/16/18 0533 07/11/18 1300 10/19/18 1837  WBC 14.0* 11.3* 15.8* 11.9*  HGB 11.4* 11.0* 14.2 16.5*  HCT 35.1* 34.7* 42.8 48.3*  PLT 283 288 332 350    COAGS: No results for input(s): INR, APTT in the last 8760 hours.  BMP: Recent Labs    06/15/18 0415 06/16/18 0533 07/11/18 1300 10/19/18 1837  NA 140 139 143 140  K 3.4* 3.5 4.0 3.0*  CL 107 105 107 100  CO2 23 23 20* 22  GLUCOSE 84 101* 119* 123*  BUN CALCIUM 8.2* 8.5* 9.8 12.7*  CREATININE 0.65 0.60 0.97 0.93  GFRNONAA >60 >60 >60 >60  GFRAA >60 >60 >60 >60    LIVER FUNCTION TESTS: Recent Labs    11/28/17 0434 11/29/17 0446 07/11/18 1300 10/19/18 1837  BILITOT 0.9 0.7 1.5* 1.4*  AST 24 20 45* 25  ALT ALKPHOS 55 46 49 57  PROT 7.5 6.5 7.6 8.6*  ALBUMIN 4.2 3.7 4.3 5.2*    TUMOR MARKERS: No results for input(s): AFPTM, CEA, CA199, CHROMGRNA in the last 8760 hours.  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 Plan for diagnostic cerebral arteriogram today. Labs reviewed. Risks and benefits of cerebral angio were discussed with the patient including, but not limited to bleeding, infection, vascular injury or contrast induced renal failure.  This interventional procedure involves the use of X-rays and because of the nature of the planned procedure, it is possible that we will have prolonged use of X-ray fluoroscopy.  Potential radiation risks to you include (but are not limited to) the following: - A slightly elevated risk for cancer  several years later in life. This risk is typically less than 0.5% percent. This risk is low in comparison to the normal incidence of human cancer, which is 33% for women and 50% for men according to the [employer]. - Radiation induced injury can include skin redness, resembling a rash, tissue breakdown / ulcers and hair loss (which can be temporary or permanent).   The likelihood of either of these occurring depends on the difficulty of the procedure and whether you are sensitive to radiation due to previous procedures, disease, or genetic conditions.   IF your procedure requires a prolonged use of radiation, you will be notified and given written instructions for further action.  It is your responsibility to monitor the irradiated area for the 2 weeks following the procedure and to notify your physician if you  are concerned that you have suffered a radiation induced injury.    All of the patient's questions were answered, patient is agreeable to proceed.  Consent signed and in chart.    Thank you for this interesting consult.  I greatly enjoyed meeting Breniya D Tabet and look forward to participating in their care.  A copy of this report was sent to the requesting provider on this date.  Electronically Signed: Brayton El, PA-C 10/20/2018, 11:46 AM   I spent a total of 30 minutes in face to face in clinical consultation, greater than 50% of which was counseling/coordinating care for cerebral angio

## 2018-10-20 NOTE — ED Notes (Signed)
Visual acuity screening  Left eye 10/12.5 Right eye 10/10  Both eyes 10/8

## 2018-10-20 NOTE — Procedures (Signed)
S/P 4 vessel cerebral arteriogram RT CFA approach. Findings. 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 4.Prominent AV shunting at  Lt ICA  [facility].

## 2018-10-20 NOTE — Sedation Documentation (Signed)
Sheath removed at 1228

## 2018-10-20 NOTE — ED Notes (Signed)
Taken to CT at this time. 

## 2018-10-20 NOTE — Progress Notes (Signed)
Patient ID: [name], female   DOB: [birth year], 48 y.o.   MRN: [id number] INR. Findings of diagnostic arteriogram D/W patient and  Dr Marlin Canary. Hospitalist. 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 However presently denies diplopia or visual acuity changes. Given her clinical exam and angio findings endovascular occlusion of the fistula to prevent vision loss and or diplopia is recommended.  Suggest keeping  close neuro monitoring   as an inpatient until treatment finalized for ?  next Tuesday under [place]. S.Jamont Mellin MD

## 2018-10-20 NOTE — H&P (Signed)
History and Physical    [name] GNF:[id number] DOB: 1970-10-20 DOA: 10/19/2018  PCP: Tresa Garter, MD  Patient coming from: Home  I have personally briefly reviewed patient's old medical records in [facility]  Chief Complaint: Tinitus  HPI: [name] is a 48 y.o. female with medical history significant of gout, barrett's esophagus, HTN.  Patient presents to the ED with c/o "whooshing" sound in her ear, which has been worsening over the past couple of days.  She states that she had several episodes of forceful vomiting today and "burst a blood vessel in her eye".  Now has some L eye pain with movement, though no vision changes.  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  Is supposed to have this electively fixed in the near future (by Dr. Corliss Skains) though this, like all elective surgeries, has been delayed due to the current COVID pandemic.   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  EDP first called neurosurgery who said to call neurointerventional.  EDP then called Dr. Corliss Skains who recommended patient be admitted for diagnostic arteriogram which he will perform either today or tomorrow.   Review of Systems: As per HPI otherwise 10 point review of systems negative.   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---   Depression    GERD (gastroesophageal reflux disease)    Hiatal hernia    History of DVT of lower extremity    08-14-2003  RIGHT LOWER EXTREMITY-- TREATED W/ COUMADIN----  per pt none since   History of palpitations    Hypertension    Idiopathic chronic gout    12-20-2017  per pt stable ,   last episode  02/ 2019   Iron deficiency anemia    Migraines    Personal history of PE (pulmonary embolism)    03/ 2005 RIGHT SIDE  --- treated w/ coumadin---  per pt none since   PONV (postoperative nausea and vomiting)    Tinea versicolor    Vitamin D deficiency     Past Surgical History:  Procedure Laterality Date   BIOPSY  11/29/2017   Procedure: BIOPSY;  Surgeon: [name], MD;  Location: [facility];  Service: Endoscopy;;   BRONCHOSCOPY  05-20-2004  dr clance   ESOPHAGOGASTRODUODENOSCOPY (EGD) WITH PROPOFOL N/A 11/29/2017   Procedure: ESOPHAGOGASTRODUODENOSCOPY (EGD) WITH PROPOFOL;  Surgeon: [name], MD;  Location: [facility];  Service: Endoscopy;  Laterality: N/A;   EXPLORATORY LAPAROTOMY  AGE 79    [facility]   for constipation   INSERTION OF MESH N/A 12/26/2017   Procedure: INSERTION OF BIO A MESH;  Surgeon: [name], MD;  Location: [facility];  Service: General;  Laterality: N/A;   KNEE ARTHROSCOPY W/ ACL RECONSTRUCTION Right 09/2003   LAPAROSCOPY BILATERAL TUBAL FULGERATION AND ATTEMPTED THERMACHOICE ABLATION WITH UTERINE PERFORATION  07-17-2009   dr meisinger  [facility]   LUNG LOBECTOMY  1991   right lower lobe for massive hemoptyosis   MINI-THORACOTOMY WEDGE RESECTION OF LEFT LOWER LOBE CYST Left 06-24-2004   dr Edwyna [facility]   hemoptyosis     reports that she  has never smoked. She has never used smokeless tobacco. She reports current alcohol use. She reports that she does not use drugs.  Allergies  Allergen Reactions   Guaifenesin Shortness Of Breath   Iron Anaphylaxis and Other (See Comments)    IV only   Celexa [Citalopram Hydrobromide]     HAs   Codeine Other (See Comments)    tiredness   Doxepin Hives   Hydrocodone-Acetaminophen    Hydromorphone Hcl Nausea And Vomiting   Metoprolol Succinate Other (See Comments)    REACTION: edema   Morphine Other (See Comments)    Does not work; hallucinations and irritable   Pantoprazole Sodium Other (See  Comments)    heartburn   Pseudoeph-Doxylamine-Dm-Apap Hives   Pseudoephedrine Hives   Sulfa Antibiotics Hives   Prednisone Rash   Tetracyclines & Related Rash    Family History  Problem Relation Age of Onset   Hypertension Other    Diabetes Other      Prior to Admission medications   Medication Sig Start Date End Date Taking? Authorizing Provider  allopurinol (ZYLOPRIM) 100 MG tablet Take 1 tablet (100 mg total) by mouth daily. Must keep visit on 11/03/17 to get refills Patient taking differently: Take 100 mg by mouth daily as needed (for gout flare up). Must keep visit on 11/03/17 to get refills 11/03/17  Yes Plotnikov, Georgina Quint, MD  aspirin-acetaminophen-caffeine (EXCEDRIN MIGRAINE) [phone] MG tablet Take 1 tablet by mouth every 6 (six) hours as needed for headache or migraine.    Yes [provider]  cetirizine (ZYRTEC) 10 MG tablet Take 10 mg by mouth daily as needed for allergies.   Yes [provider]  Cholecalciferol (VITAMIN D3) 2000 units TABS Take 2,000 Units by mouth daily.    Yes [provider]  diazepam (VALIUM) 5 MG tablet TAKE 1 TABLET BY MOUTH EVERY 12 HOURS IF NEEDED FOR ANXIETY Patient taking differently: Take 5 mg by mouth every 12 (twelve) hours as needed for anxiety. take 1 tablet by mouth every 12 hours if needed for anxiety 05/16/18  Yes Plotnikov, Georgina Quint, MD  ferrous gluconate (IRON 27) 240 (27 FE) MG tablet Take 240 mg by mouth daily.    Yes [provider]  hydrochlorothiazide (HYDRODIURIL) 25 MG tablet Take 25 mg by mouth daily.    Yes [provider]  Multiple Vitamins-Minerals (MULTIVITAMIN ADULTS PO) Take 1 tablet by mouth daily.    Yes [provider]  potassium chloride SA (K-DUR,KLOR-CON) 20 MEQ tablet Take 2 tablets (40 mEq total) by mouth every morning. Patient taking differently: Take 20 mEq by mouth daily.  06/16/18  Yes Hongalgi, Maximino Greenland, MD  promethazine (PHENERGAN) 12.5 MG tablet  Take 1 tablet (12.5 mg total) by mouth every 8 (eight) hours as needed for nausea or vomiting. 10/19/18  Yes Myrlene Broker, MD  metroNIDAZOLE (FLAGYL) 500 MG tablet Take 1 tablet (500 mg total) by mouth 2 (two) times daily. Patient not taking: Reported on 07/11/2018 06/21/18   Plotnikov, Georgina Quint, MD  oseltamivir (TAMIFLU) 75 MG capsule Take 1 capsule (75 mg total) by mouth 2 (two) times daily. Patient not taking: Reported on 10/20/2018 08/17/18   Myrlene Broker, MD    Physical Exam: Vitals:   10/20/18 0430 10/20/18 0515 10/20/18 0623 10/20/18 0642  BP: (!) 138/96 (!) 146/107  (!) 151/103  Pulse: 75 89  82  Resp:    16  Temp:   98.7 F (37.1 C) 98 F (36.7 C)  TempSrc:   Oral Oral  SpO2: 96% 97%  98%  Weight:    47.2 kg  Height:     (1.702 m)    Constitutional: NAD, calm, comfortable Eyes: Mild proptosis of L eye, subconjunctival hematoma, some discomfort with eye movement. ENMT: Mucous membranes are moist. Posterior pharynx clear of any exudate or lesions.Normal dentition.  Neck: normal, supple, no masses, no thyromegaly Respiratory: clear to auscultation bilaterally, no wheezing, no crackles. Normal respiratory effort. No accessory muscle use.  Cardiovascular: Regular rate and rhythm, no murmurs / rubs / gallops. No extremity edema. 2+ pedal pulses. No carotid bruits.  Abdomen: no tenderness, no masses palpated. No hepatosplenomegaly. Bowel sounds positive.  Musculoskeletal: no clubbing / cyanosis. No joint deformity upper and lower extremities. Good ROM, no contractures. Normal muscle tone.  Skin: no rashes, lesions, ulcers. No induration Neurologic: CN 2-12 grossly intact. Sensation intact, DTR normal. Strength 5/5 in all 4.  Psychiatric: Normal judgment and insight. Alert and oriented x 3. Normal mood.    Labs on Admission: I have personally reviewed following labs and imaging studies  CBC: Recent Labs  Lab 10/19/18 1837  WBC 11.9*  HGB 16.5*  HCT 48.3*    MCV 86.6  PLT 350   Basic Metabolic Panel: Recent Labs  Lab 10/19/18 1837  NA 140  K 3.0*  CL 100  CO2 22  GLUCOSE 123*  BUN 11  CREATININE 0.93  CALCIUM 12.7*   GFR: Estimated Creatinine Clearance: 55.1 mL/min (by C-G formula based on SCr of 0.93 mg/dL). Liver Function Tests: Recent Labs  Lab 10/19/18 1837  AST 25  ALT 12  ALKPHOS 57  BILITOT 1.4*  PROT 8.6*  ALBUMIN 5.2*   Recent Labs  Lab 10/19/18 1837  LIPASE 23   No results for input(s): AMMONIA in the last 168 hours. Coagulation Profile: No results for input(s): INR, PROTIME in the last 168 hours. Cardiac Enzymes: No results for input(s): CKTOTAL, CKMB, CKMBINDEX, TROPONINI in the last 168 hours. BNP (last 3 results) No results for input(s): PROBNP in the last 8760 hours. HbA1C: No results for input(s): HGBA1C in the last 72 hours. CBG: No results for input(s): GLUCAP in the last 168 hours. Lipid Profile: No results for input(s): CHOL, HDL, LDLCALC, TRIG, CHOLHDL, LDLDIRECT in the last 72 hours. Thyroid Function Tests: No results for input(s): TSH, T4TOTAL, FREET4, T3FREE, THYROIDAB in the last 72 hours. Anemia Panel: No results for input(s): VITAMINB12, FOLATE, FERRITIN, TIBC, IRON, RETICCTPCT in the last 72 hours. Urine analysis:    Component Value Date/Time   COLORURINE YELLOW 10/19/2018 1937   APPEARANCEUR CLOUDY (A) 10/19/2018 1937   LABSPEC 1.014 10/19/2018 1937   PHURINE 8.0 10/19/2018 1937   GLUCOSEU NEGATIVE 10/19/2018 1937   GLUCOSEU NEGATIVE 10/14/2016 1033   HGBUR LARGE (A) 10/19/2018 1937   BILIRUBINUR NEGATIVE 10/19/2018 1937   KETONESUR 20 (A) 10/19/2018 1937   PROTEINUR 100 (A) 10/19/2018 1937   UROBILINOGEN 0.2 10/14/2016 1033   NITRITE NEGATIVE 10/19/2018 1937   LEUKOCYTESUR NEGATIVE 10/19/2018 1937    Radiological Exams on Admission: Ct Angio Head W Or Wo Contrast  Result Date: 10/20/2018 CLINICAL DATA:  Initial evaluation for acute headache. EXAM: CT ANGIOGRAPHY HEAD  AND NECK TECHNIQUE: Multidetector CT imaging of the head and neck was performed using the standard protocol during bolus administration of intravenous contrast. Multiplanar CT image reconstructions and MIPs were obtained to evaluate the vascular anatomy. Carotid stenosis measurements (when applicable) are obtained utilizing NASCET criteria, using the distal  internal carotid diameter as the denominator. CONTRAST:  100mL OMNIPAQUE IOHEXOL 350 MG/ML SOLN COMPARISON:  Prior CTA from 06/29/2018. FINDINGS: CT HEAD FINDINGS Brain: Cerebral volume stable, and within normal limits. No acute intracranial hemorrhage. No acute large vessel territory infarct. No mass lesion, midline shift or mass effect. No hydrocephalus. No extra-axial fluid collection. Vascular: No asymmetric hyperdense vessel. Skull: Scalp soft tissues demonstrate no acute finding. Calvarium intact. Sinuses: Air-fluid level noted within the dominant left sphenoid sinus. Paranasal sinuses are otherwise clear. Mastoid air cells and middle ear cavities are well pneumatized and free of fluid. Soft tissue density within the left EAC likely reflects cerumen. Orbits: Asymmetric soft tissue stranding seen involving the intraconal fat of the left orbit (series 5, image 118). Left lateral rectus muscle appears enlarged, new from previous. Globes remain symmetric in size with normal morphology and appearance. Review of the MIP images confirms the above findings CTA NECK FINDINGS Aortic arch: Visualized aortic arch normal in caliber with normal 3 vessel morphology. Minimal plaque at the origin of the left subclavian artery. No flow-limiting stenosis about the origin of the great vessels. Subclavian arteries otherwise widely patent. Right carotid system: Right common carotid artery widely patent from its origin to the bifurcation without abnormality or stenosis. No atheromatous narrowing about the right carotid bifurcation. Multiple segments of ectasias again seen  involving the mid-distal right ICA measuring up to 9 mm in maximal diameter, similar to previous. Superimposed saccular aneurysm at the distal right ICA just proximal to the skull base relatively stable measuring 8 x 7 x 8 mm (transverse by craniocaudad by AP, series 12, image 111). No dissection or stenosis. Left carotid system: Left common carotid artery patent from its origin to the bifurcation without abnormality. No atheromatous narrowing about the left bifurcation. Aneurysmal dilatation of the distal left ICA up to 12 mm, relatively similar. No dissection or stenosis. Vertebral arteries: Both of the vertebral arteries arise from the subclavian arteries. Left vertebral artery dominant. No evidence for dissection or flow-limiting stenosis. Focal ectasia up at the level of C2 measuring up to 6 mm on the left and 5 mm on the right, stable. Skeleton: Mild multilevel cervical spondylolysis without significant spinal stenosis, stable. Other neck: No other acute soft tissue abnormality within the neck. Few subcentimeter hypodense thyroid nodules noted, of doubtful significance. Upper chest: Visualized upper chest demonstrates no acute finding. Review of the MIP images confirms the above findings CTA HEAD FINDINGS Anterior circulation: Petrous segments patent bilaterally. Cavernous/supraclinoid ICAs patent without flow-limiting stenosis or definite abnormality. Adjacent venous contamination within the cavernous sinus limits evaluation. There is asymmetric engorgement and filling of the left superior orbital vein, raising the possibility for CC fistula (series 12, image 71). Additionally, there is asymmetric filling of the left inferior petrosal sinus. A1 segments patent bilaterally. Normal anterior communicating artery. Anterior cerebral arteries well perfused to their distal aspects. M1 segments patent bilaterally. Distal MCA branches well perfused and symmetric. Posterior circulation: Left vertebral artery ectatic  measuring up to 5 mm, similar to previous. Right vertebral artery diffusely hypoplastic. Posterior inferior cerebral arteries patent proximally. Basilar patent to its distal aspect. Superior cerebral arteries perfuse bilaterally. Both of the PCAs patent to their distal aspects without stenosis. Venous sinuses: Concern for left-sided CC fistula or possibly dural AVF as above. Venous sinuses otherwise grossly patent allowing for timing of the contrast bolus. Anatomic variants: Tiny right posterior communicating artery noted. No other significant anatomic variant. No appreciable aneurysm. Delayed phase: No abnormal enhancement. Review  of the MIP images confirms the above findings IMPRESSION: 1. Abnormal fat stranding involving the intraconal fat of the left orbit with associated extraocular muscle enlargement and engorgement of the left superior orbital vein. Findings concerning for possible cavernous carotid fistula or other arteriovenous shunt about the cavernous sinus. Further assessment with dedicated catheter directed arteriogram recommended for further evaluation. 2. Otherwise stable CTA with multiple segments of ectasia involving the carotid and vertebral arteries bilaterally. 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 Again, findings may reflect fibromuscular dysplasia or underlying connective tissue disorder. 3. No large vessel occlusion, arterial dissection, or hemodynamically significant stenosis identified. 4. No other acute intracranial abnormality. Electronically Signed   By: Rise Mu M.D.   On: 10/20/2018 02:29   Ct Angio Neck W And/or Wo Contrast  Result Date: 10/20/2018 CLINICAL DATA:  Initial evaluation for acute headache. EXAM: CT ANGIOGRAPHY HEAD AND NECK TECHNIQUE: Multidetector CT imaging of the head and neck was performed using the standard protocol during bolus administration of intravenous contrast. Multiplanar  CT image reconstructions and MIPs were obtained to evaluate the vascular anatomy. Carotid stenosis measurements (when applicable) are obtained utilizing NASCET criteria, using the distal internal carotid diameter as the denominator. CONTRAST:  OMNIPAQUE IOHEXOL 350 MG/ML SOLN COMPARISON:  Prior CTA from 06/29/2018. FINDINGS: CT HEAD FINDINGS Brain: Cerebral volume stable, and within normal limits. No acute intracranial hemorrhage. No acute large vessel territory infarct. No mass lesion, midline shift or mass effect. No hydrocephalus. No extra-axial fluid collection. Vascular: No asymmetric hyperdense vessel. Skull: Scalp soft tissues demonstrate no acute finding. Calvarium intact. Sinuses: Air-fluid level noted within the dominant left sphenoid sinus. Paranasal sinuses are otherwise clear. Mastoid air cells and middle ear cavities are well pneumatized and free of fluid. Soft tissue density within the left EAC likely reflects cerumen. Orbits: Asymmetric soft tissue stranding seen involving the intraconal fat of the left orbit (series 5, image 118). Left lateral rectus muscle appears enlarged, new from previous. Globes remain symmetric in size with normal morphology and appearance. Review of the MIP images confirms the above findings CTA NECK FINDINGS Aortic arch: Visualized aortic arch normal in caliber with normal 3 vessel morphology. Minimal plaque at the origin of the left subclavian artery. No flow-limiting stenosis about the origin of the great vessels. Subclavian arteries otherwise widely patent. Right carotid system: Right common carotid artery widely patent from its origin to the bifurcation without abnormality or stenosis. No atheromatous narrowing about the right carotid bifurcation. Multiple segments of ectasias again seen involving the mid-distal right ICA measuring up to 9 mm in maximal diameter, similar to previous. Superimposed saccular aneurysm at the distal right ICA just proximal to the skull  base relatively stable measuring 8 x 7 x 8 mm (transverse by craniocaudad by AP, series 12, image 111). No dissection or stenosis. Left carotid system: Left common carotid artery patent from its origin to the bifurcation without abnormality. No atheromatous narrowing about the left bifurcation. Aneurysmal dilatation of the distal left ICA up to 12 mm, relatively similar. No dissection or stenosis. Vertebral arteries: Both of the vertebral arteries arise from the subclavian arteries. Left vertebral artery dominant. No evidence for dissection or flow-limiting stenosis. Focal ectasia up at the level of C2 measuring up to 6 mm on the left and 5 mm on the right, stable. Skeleton: Mild multilevel cervical spondylolysis without significant spinal stenosis, stable. Other neck: No other acute soft tissue abnormality within the  neck. Few subcentimeter hypodense thyroid nodules noted, of doubtful significance. Upper chest: Visualized upper chest demonstrates no acute finding. Review of the MIP images confirms the above findings CTA HEAD FINDINGS Anterior circulation: Petrous segments patent bilaterally. Cavernous/supraclinoid ICAs patent without flow-limiting stenosis or definite abnormality. Adjacent venous contamination within the cavernous sinus limits evaluation. There is asymmetric engorgement and filling of the left superior orbital vein, raising the possibility for CC fistula (series 12, image 71). Additionally, there is asymmetric filling of the left inferior petrosal sinus. A1 segments patent bilaterally. Normal anterior communicating artery. Anterior cerebral arteries well perfused to their distal aspects. M1 segments patent bilaterally. Distal MCA branches well perfused and symmetric. Posterior circulation: Left vertebral artery ectatic measuring up to 5 mm, similar to previous. Right vertebral artery diffusely hypoplastic. Posterior inferior cerebral arteries patent proximally. Basilar patent to its distal aspect.  Superior cerebral arteries perfuse bilaterally. Both of the PCAs patent to their distal aspects without stenosis. Venous sinuses: Concern for left-sided CC fistula or possibly dural AVF as above. Venous sinuses otherwise grossly patent allowing for timing of the contrast bolus. Anatomic variants: Tiny right posterior communicating artery noted. No other significant anatomic variant. No appreciable aneurysm. Delayed phase: No abnormal enhancement. Review of the MIP images confirms the above findings IMPRESSION: 1. Abnormal fat stranding involving the intraconal fat of the left orbit with associated extraocular muscle enlargement and engorgement of the left superior orbital vein. Findings concerning for possible cavernous carotid fistula or other arteriovenous shunt about the cavernous sinus. Further assessment with dedicated catheter directed arteriogram recommended for further evaluation. 2. Otherwise stable CTA with multiple segments of ectasia involving the carotid and vertebral arteries bilaterally. 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 Again, findings may reflect fibromuscular dysplasia or underlying connective tissue disorder. 3. No large vessel occlusion, arterial dissection, or hemodynamically significant stenosis identified. 4. No other acute intracranial abnormality. Electronically Signed   By: Rise Mu M.D.   On: 10/20/2018 02:29   Dg Chest Port 1 View  Result Date: 10/20/2018 CLINICAL DATA:  Hypercalcemia. EXAM: PORTABLE CHEST 1 VIEW COMPARISON:  CT chest and chest x-ray dated July 11, 2018. FINDINGS: The heart size and mediastinal contours are within normal limits. Normal pulmonary vascularity. Chronic pleural thickening at the bilateral costophrenic angles is unchanged. No focal consolidation, pleural effusion, or pneumothorax. No acute osseous abnormality. IMPRESSION: No active disease. Electronically Signed    By: Obie Dredge M.D.   On: 10/20/2018 05:55    EKG: Independently reviewed.  Assessment/Plan Principal Problem:   Carotid-cavernous fistula Active Problems:   Essential hypertension   Hypokalemia   Hypercalcemia   Aneurysm of right internal carotid artery    1. 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 Vision seems to be grossly intact for the moment 1. But history certainly suspect for increased IOP in L eye at the least. 2. Consult optho for eval later this AM, though from my understanding: closure of the [facility] is the definitive treatment. 2. Dr. Corliss Skains to evaluate, perform arteriogram for diagnosis (and presumably treatment). 3. NPO 4. Zofran PRN nausea 2. R ICA aneurysm - 1. To be coiled electively (either with [facility] procedure or at later date, TBD by Dr. Corliss Skains) 3. Hypercalcemia - Suspect dehydration from N/V + HCTZ most likely cause 1. Hold HCTZ 2. IVF: NS at 75 3. CXR 4. PTH and PTHrp 5. Repeat BMP tomorrow AM 4. HTN - 1. Hold HCTZ 2. Use PRN IV hydralazine  instead 5. Hypokalemia - Replace, hold HCTZ  DVT prophylaxis: SCDs Code Status: Full Family Communication: No family in room Disposition Plan: Home after admit Consults called: EDP spoke with Dr. Corliss Skains Admission status: Admit to inpatient  Severity of Illness: The appropriate patient status for this patient is INPATIENT. Inpatient status is judged to be reasonable and necessary in order to provide the required intensity of service to ensure the patient's safety. The patient's presenting symptoms, physical exam findings, and initial radiographic and laboratory data in the context of their chronic comorbidities is felt to place them at high risk for further clinical deterioration. Furthermore, it is not anticipated that the patient will be medically stable for discharge from the hospital within 2 midnights of admission. The following factors support the patient status of inpatient.   " The patient's  presenting symptoms include Tinitus, N/V. " The initial radiographic and laboratory data are worrisome because of Apparent new Cavernous Carotid fistula with engorgement of retinal vein.  * I certify that at the point of admission it is my clinical judgment that the patient will require inpatient hospital care spanning beyond 2 midnights from the point of admission due to high intensity of service, high risk for further deterioration and high frequency of surveillance required.*    Aria Jarrard M. DO [facility]  How to contact the [facility] Attending or Consulting provider 7A - 7P or covering provider during after hours 7P -7A, for this patient?  1. Check the care team in [facility] and look for a) attending/consulting [facility] provider listed and b) the [facility] team listed 2. Log into [email or web address]  [employer] Physician Scheduling and messaging for groups and whole hospitals  On call and physician scheduling software for group practices, residents, hospitalists and other medical providers for call, clinic, rotation and shift schedules. [employer] is a hospital-wide system for scheduling doctors and paging doctors on call. [employer] is for scientific plotting and data analysis.  [email or web address]  and use [place]'s universal password to access. If you do not have the password, please contact the hospital operator.  3. Locate the [facility] provider you are looking for under [facility] and page to a number that you can be directly reached. 4. If you still have difficulty reaching the provider, please page the [facility] (Director on Call) for the Hospitalists listed on amion for assistance.  10/20/2018, 7:12 AM

## 2018-10-20 NOTE — Progress Notes (Signed)
PROGRESS NOTE    [name]  ZOX:[id number] DOB: 11/03/1970 DOA: 10/19/2018 PCP: Tresa GarterPlotnikov, Aleksei V, MD    Brief Narrative:  48 year old female who presented with nausea, vomiting, and left eye pain.  She does have significant past medical history for Barrett's esophagus and hypertension.  Was recently diagnosed with a 9 mm right ICA aneurysm which repair was on hold due to current COVID pandemic.  Her initial physical examination blood pressure 138/96, heart rate 89, respirate 16, temperature 98, oxygen saturation 98%.  She had mild proptosis of the left eye, subconjunctival hematoma and positive discomfort with eye movement.  Moist mucous membranes, lungs clear to auscultation bilaterally, heart S1-S2 present rhythmic, abdomen soft, no lower extremity edema.  CT angiography of the head showed abnormal fat stranding involving the intraconal fat of the left orbit with associated extraocular muscle enlargement and engorgement of the left superior orbital vein, findings concerning for possible cavernous carotid fistula or arteriovenous shunt about the cavernous sinus. Chest film with no infiltrates, left base pleural thickening, costophrenic angle.   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   Assessment & Plan:   Principal Problem:   Carotid-cavernous fistula Active Problems:   Essential hypertension   Hypokalemia   Hypercalcemia   Aneurysm of right internal carotid artery   1. Left carotid-cavernous fistula. Patient continue to have left periorbital edema, positive conjucntival bleeding. Will follow with angiography per Dr. [name], and further recommendations.   2. HTN. Blood pressure today 137/86. Continue blood pressure monitoring.   3. Hypokalemia with hypercalcemia. K last pm at 3,0, had 40 kcl this am, will add another 40 kcl for a total of 80 meq today. Continue IV fluids with balanced electrolyte solutions. Follow on  renal panel today. Ca at 12,7, will continue hydration. Patient at home on HCTZ.   4. Dyspepsia. Will continue nausea control with phenergan and will add antiacid therapy.   5. Anxiety. Resume diazepam as needed.   DVT prophylaxis: scd   Code Status:  full Family Communication: no family at the bedside  Disposition Plan/ discharge barriers: pending angiography   Body mass index is 16.3 kg/m. Malnutrition Type:      Malnutrition Characteristics:      Nutrition Interventions:     RN Pressure Injury Documentation:     Consultants:   Interventional   Procedures:   Cerebral angiogram   Antimicrobials:       Subjective: Patient with nausea and vomiting, left eye discomfort, but no pain, no visual disturbances, no chest pain or dyspnea. No headache.   Objective: Vitals:   10/20/18 0515 10/20/18 0623 10/20/18 0642 10/20/18 0850  BP: (!) 146/107  (!) 151/103 (!) 149/97  Pulse: 89  82 92  Resp:   16 13  Temp:  98.7 F (37.1 C) 98 F (36.7 C) 99.1 F (37.3 C)  TempSrc:  Oral Oral Oral  SpO2: 97%  98% 96%  Weight:   47.2 kg   Height:   5\' 7"  (1.702 m)    No intake or output data in the 24 hours ending 10/20/18 0903 Filed Weights   10/19/18 2206 10/20/18 [id number]  Weight: 50.3 kg 47.2 kg    Examination:   General: Not in pain or dyspnea, deconditioned  Neurology: Awake and alert, non focal  E ENT: no pallor, no icterus, oral mucosa moist/ Left eye with periorbital edema, conjunctival hematoma.  Cardiovascular: No JVD. S1-S2 present, rhythmic, no gallops, rubs, or  murmurs. No lower extremity edema. Pulmonary: vesicular breath sounds bilaterally, adequate air movement, no wheezing, rhonchi or rales. Gastrointestinal. Abdomen with no organomegaly, non tender, no rebound or guarding Skin. No rashes Musculoskeletal: no joint deformities     Data Reviewed: I have personally reviewed following labs and imaging studies  CBC: Recent Labs  Lab 10/19/18 1837   WBC 11.9*  HGB 16.5*  HCT 48.3*  MCV 86.6  PLT 350   Basic Metabolic Panel: Recent Labs  Lab 10/19/18 1837  NA 140  K 3.0*  CL 100  CO2 22  GLUCOSE 123*  BUN 11  CREATININE 0.93  CALCIUM 12.7*   GFR: Estimated Creatinine Clearance: 55.1 mL/min (by C-G formula based on SCr of 0.93 mg/dL). Liver Function Tests: Recent Labs  Lab 10/19/18 1837  AST 25  ALT 12  ALKPHOS 57  BILITOT 1.4*  PROT 8.6*  ALBUMIN 5.2*   Recent Labs  Lab 10/19/18 1837  LIPASE 23   No results for input(s): AMMONIA in the last 168 hours. Coagulation Profile: No results for input(s): INR, PROTIME in the last 168 hours. Cardiac Enzymes: No results for input(s): CKTOTAL, CKMB, CKMBINDEX, TROPONINI in the last 168 hours. BNP (last 3 results) No results for input(s): PROBNP in the last 8760 hours. HbA1C: No results for input(s): HGBA1C in the last 72 hours. CBG: No results for input(s): GLUCAP in the last 168 hours. Lipid Profile: No results for input(s): CHOL, HDL, LDLCALC, TRIG, CHOLHDL, LDLDIRECT in the last 72 hours. Thyroid Function Tests: No results for input(s): TSH, T4TOTAL, FREET4, T3FREE, THYROIDAB in the last 72 hours. Anemia Panel: No results for input(s): VITAMINB12, FOLATE, FERRITIN, TIBC, IRON, RETICCTPCT in the last 72 hours.    Radiology Studies: I have reviewed all of the imaging during this hospital visit personally     Scheduled Meds: Continuous Infusions: . sodium chloride 75 mL/hr at 10/20/18 0606  . potassium chloride 10 mEq (10/20/18 0851)     LOS: 0 days        Mauricio Annett Gula, MD

## 2018-10-21 LAB — BASIC METABOLIC PANEL
Anion gap: 14 (ref 5–15)
BUN: 12 mg/dL (ref 6–20)
CO2: 22 mmol/L (ref 22–32)
Calcium: 9.2 mg/dL (ref 8.9–10.3)
Chloride: 103 mmol/L (ref 98–111)
Creatinine, Ser: 0.71 mg/dL (ref 0.44–1.00)
GFR calc Af Amer: >60 mL/min (ref >60)
GFR calc non Af Amer: >60 mL/min (ref >60)
Glucose, Bld: 92 mg/dL (ref 70–99)
Potassium: 2.6 mmol/L — CL (ref 3.5–5.1)
Sodium: 139 mmol/L (ref 135–145)

## 2018-10-21 LAB — CBC WITH DIFFERENTIAL/PLATELET
Abs Immature Granulocytes: 0.05 10*3/uL (ref 0.00–0.07)
Basophils Absolute: 0 10*3/uL (ref 0.0–0.1)
Basophils Relative: 0 %
Eosinophils Absolute: 0.1 10*3/uL (ref 0.0–0.5)
Eosinophils Relative: 0 %
HCT: 35.7 % — ABNORMAL LOW (ref 36.0–46.0)
Hemoglobin: 12.2 g/dL (ref 12.0–15.0)
Immature Granulocytes: 0 %
Lymphocytes Relative: 25 %
Lymphs Abs: 3.8 10*3/uL (ref 0.7–4.0)
MCH: 29.8 pg (ref 26.0–34.0)
MCHC: 34.2 g/dL (ref 30.0–36.0)
MCV: 87.3 fL (ref 80.0–100.0)
Monocytes Absolute: 1.2 10*3/uL — ABNORMAL HIGH (ref 0.1–1.0)
Monocytes Relative: 8 %
Neutro Abs: 9.8 10*3/uL — ABNORMAL HIGH (ref 1.7–7.7)
Neutrophils Relative %: 67 %
Platelets: 271 10*3/uL (ref 150–400)
RBC: 4.09 MIL/uL (ref 3.87–5.11)
RDW: 13.3 % (ref 11.5–15.5)
WBC: 14.9 10*3/uL — ABNORMAL HIGH (ref 4.0–10.5)
nRBC: 0 % (ref 0.0–0.2)

## 2018-10-21 LAB — PTH, INTACT AND CALCIUM
Calcium, Total (PTH): 11.2 mg/dL — ABNORMAL HIGH (ref 8.7–10.2)
PTH: 10 pg/mL — ABNORMAL LOW (ref 15–65)

## 2018-10-21 MED ORDER — POTASSIUM CHLORIDE CRYS ER 20 MEQ PO TBCR: 40.0000 meq | EXTENDED_RELEASE_TABLET | Freq: Two times a day (BID) | ORAL | Status: DC

## 2018-10-21 MED ORDER — ONDANSETRON HCL 4 MG/2ML IJ SOLN: 4.0000 mg | Freq: Four times a day (QID) | INTRAMUSCULAR | Status: DC | PRN

## 2018-10-21 MED ORDER — POTASSIUM CHLORIDE CRYS ER 20 MEQ PO TBCR
40.0000 meq | EXTENDED_RELEASE_TABLET | Freq: Once | ORAL | Status: AC
  Administered 2018-10-21: 40 meq via ORAL
  Filled 2018-10-21: qty 4

## 2018-10-21 MED ORDER — POTASSIUM CHLORIDE 10 MEQ/100ML IV SOLN
10.0000 meq | INTRAVENOUS | Status: AC
  Administered 2018-10-21 (×4): 10 meq via INTRAVENOUS
  Filled 2018-10-21 (×2): qty 100

## 2018-10-21 NOTE — Progress Notes (Signed)
CRITICAL VALUE ALERT  Critical Value:  Potassium 2.6  Date & Time Notied:  10/21/18 @0532   Provider Notified: NP on call (Bodenheimer) Orders Received/Actions taken: see orders

## 2018-10-21 NOTE — Progress Notes (Addendum)
Patient stable, had 2x vomit during the night.

## 2018-10-21 NOTE — Progress Notes (Signed)
PROGRESS NOTE    [name]  KMM:[id number] DOB: 1970/09/22 DOA: 10/19/2018 PCP: Tresa Garter, MD    Brief Narrative:  48 year old female who presented with nausea, vomiting, and left eye pain.  She does have significant past medical history for Barrett's esophagus and hypertension.  Was recently diagnosed with a 9 mm right ICA aneurysm which repair was on hold due to current COVID pandemic.  Her initial physical examination blood pressure 138/96, heart rate 89, respirate 16, temperature 98, oxygen saturation 98%.  She had mild proptosis of the left eye, subconjunctival hematoma and positive discomfort with eye movement.  Moist mucous membranes, lungs clear to auscultation bilaterally, heart S1-S2 present rhythmic, abdomen soft, no lower extremity edema.  CT angiography of the head showed abnormal fat stranding involving the intraconal fat of the left orbit with associated extraocular muscle enlargement and engorgement of the left superior orbital vein, findings concerning for possible cavernous carotid fistula or arteriovenous shunt about the cavernous sinus. Chest film with no infiltrates, left base pleural thickening, costophrenic angle.   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    Assessment & Plan:   Principal Problem:   Carotid-cavernous fistula Active Problems:   Essential hypertension   Hypokalemia   Hypercalcemia   Aneurysm of right internal carotid artery   1. Left carotid-cavernous fistula. Persistent left periorbital ecchymosis, positive conjucntival bleeding and edema. Continue close neurologic monitoring, plan for IR intervention on 10/23/18. Continue pain and nausea control.   2. HTN. Resumed low dose HCTZ for further blood pressure control,  Today systolic 127 to 147 mmHg.   3. Hypokalemia with hypercalcemia. Will continue aggressive K correction with kcl IV and PO, renal function preserved with serum  cr at 0,71 and serum bicarbonate at 22. Serum Ca at 9,2. Continue gentle IV fluids with dextrose and balance electrolyte solutions.   4. Dyspepsia. Persistent nausea, continue zofran and phenergan as needed. Advance diet as tolerated.   5. Anxiety. Continue as needed diazepam for anxiety per home regimen. .   DVT prophylaxis: scd   Code Status:  full Family Communication: no family at the bedside  Disposition Plan/ discharge barriers: pending IR procedure 05/05.   Body mass index is 16.3 kg/m. Malnutrition Type:      Malnutrition Characteristics:      Nutrition Interventions:     RN Pressure Injury Documentation:     Consultants:   Interventional radiology   Procedures:   Cerebral angiogram   Antimicrobials:      Subjective: Patient continue to have nausea and decreased po intake, no chest pain, no dyspnea. Continue to have fullness on her left eye.   Objective: Vitals:   10/20/18 2030 10/20/18 2130 10/20/18 2308 10/21/18 0309  BP: 129/82 127/88 136/89 124/79  Pulse: 94 99 (!) 102 90  Resp: 16 18 (!) 21 15  Temp:   98.2 F (36.8 C) 98 F (36.7 C)  TempSrc:   Oral Oral  SpO2: 99% 99% 98% 99%  Weight:      Height:        Intake/Output Summary (Last 24 hours) at 10/21/2018 0820 Last data filed at 10/21/2018 0300 Gross per 24 hour  Intake 793.22 ml  Output 900 ml  Net -106.78 ml   Filed Weights   10/19/18 2206 10/20/18 0642  Weight: 50.3 kg 47.2 kg    Examination:   General: deconditioned  Neurology: Awake and alert, non focal  E ENT: no pallor,  no icterus, oral mucosa moist/ left eye with worsening conjunctival edema and periorbital ecchymosis.  Cardiovascular: No JVD. S1-S2 present, rhythmic, no gallops, rubs, or murmurs. No lower extremity edema. Pulmonary: positive breath sounds bilaterally, adequate air movement, no wheezing, rhonchi or rales. Gastrointestinal. Abdomen with no organomegaly, non tender, no rebound or guarding Skin. No  rashes Musculoskeletal: no joint deformities     Data Reviewed: I have personally reviewed following labs and imaging studies  CBC: Recent Labs  Lab 10/19/18 1837 10/21/18 0401  WBC 11.9* 14.9*  NEUTROABS  --  9.8*  HGB 16.5* 12.2  HCT 48.3* 35.7*  MCV 86.6 87.3  PLT 350 271   Basic Metabolic Panel: Recent Labs  Lab 10/19/18 1837 10/20/18 1541 10/21/18 0401  NA 140 138 139  K 3.0* 3.5 2.6*  CL 100 105 103  CO2 22 17* 22  GLUCOSE 123* 108* 92  BUN 11 13 12   CREATININE 0.93 0.80 0.71  CALCIUM 12.7* 9.4 9.2   GFR: Estimated Creatinine Clearance: 64.1 mL/min (by C-G formula based on SCr of 0.71 mg/dL). Liver Function Tests: Recent Labs  Lab 10/19/18 1837  AST 25  ALT 12  ALKPHOS 57  BILITOT 1.4*  PROT 8.6*  ALBUMIN 5.2*   Recent Labs  Lab 10/19/18 1837  LIPASE 23   No results for input(s): AMMONIA in the last 168 hours. Coagulation Profile: No results for input(s): INR, PROTIME in the last 168 hours. Cardiac Enzymes: No results for input(s): CKTOTAL, CKMB, CKMBINDEX, TROPONINI in the last 168 hours. BNP (last 3 results) No results for input(s): PROBNP in the last 8760 hours. HbA1C: No results for input(s): HGBA1C in the last 72 hours. CBG: No results for input(s): GLUCAP in the last 168 hours. Lipid Profile: No results for input(s): CHOL, HDL, LDLCALC, TRIG, CHOLHDL, LDLDIRECT in the last 72 hours. Thyroid Function Tests: No results for input(s): TSH, T4TOTAL, FREET4, T3FREE, THYROIDAB in the last 72 hours. Anemia Panel: No results for input(s): VITAMINB12, FOLATE, FERRITIN, TIBC, IRON, RETICCTPCT in the last 72 hours.    Radiology Studies: I have reviewed all of the imaging during this hospital visit personally     Scheduled Meds: . hydrochlorothiazide  12.5 mg Oral Daily  . potassium chloride  40 mEq Oral Once   Continuous Infusions: . dextrose 5% lactated ringers 75 mL/hr at 10/20/18 2112  . potassium chloride 10 mEq (10/21/18 0804)      LOS: 1 day         Annett Gulaaniel , MD

## 2018-10-22 ENCOUNTER — Other Ambulatory Visit: Payer: Self-pay | Admitting: Student

## 2018-10-22 LAB — PROTIME-INR
INR: 1 (ref 0.8–1.2)
Prothrombin Time: 13.3 seconds (ref 11.4–15.2)

## 2018-10-22 LAB — BASIC METABOLIC PANEL
Anion gap: 10 (ref 5–15)
BUN: 11 mg/dL (ref 6–20)
CO2: 24 mmol/L (ref 22–32)
Calcium: 9 mg/dL (ref 8.9–10.3)
Chloride: 107 mmol/L (ref 98–111)
Creatinine, Ser: 0.86 mg/dL (ref 0.44–1.00)
GFR calc Af Amer: >60 mL/min (ref >60)
GFR calc non Af Amer: >60 mL/min (ref >60)
Glucose, Bld: 89 mg/dL (ref 70–99)
Potassium: 3 mmol/L — ABNORMAL LOW (ref 3.5–5.1)
Sodium: 141 mmol/L (ref 135–145)

## 2018-10-22 LAB — MAGNESIUM: Magnesium: 1.7 mg/dL (ref 1.7–2.4)

## 2018-10-22 MED ORDER — CLOPIDOGREL BISULFATE 75 MG PO TABS: 75.0000 mg | ORAL_TABLET | Freq: Once | ORAL | Status: DC

## 2018-10-22 MED ORDER — ASPIRIN 325 MG PO TABS
325.0000 mg | ORAL_TABLET | Freq: Every day | ORAL | Status: DC
  Administered 2018-10-22: 325 mg via ORAL
  Filled 2018-10-22: qty 1

## 2018-10-22 MED ORDER — CLOPIDOGREL BISULFATE 75 MG PO TABS
75.0000 mg | ORAL_TABLET | Freq: Once | ORAL | Status: AC
  Administered 2018-10-22: 75 mg via ORAL
  Filled 2018-10-22: qty 1

## 2018-10-22 MED ORDER — CLOPIDOGREL BISULFATE 75 MG PO TABS: 75.0000 mg | ORAL_TABLET | Freq: Every day | ORAL | Status: DC

## 2018-10-22 MED ORDER — POTASSIUM CHLORIDE CRYS ER 20 MEQ PO TBCR
40.0000 meq | EXTENDED_RELEASE_TABLET | ORAL | Status: AC
  Administered 2018-10-22 (×2): 40 meq via ORAL
  Filled 2018-10-22 (×2): qty 2

## 2018-10-22 MED ORDER — CLOPIDOGREL BISULFATE 75 MG PO TABS
300.0000 mg | ORAL_TABLET | Freq: Once | ORAL | Status: AC
  Administered 2018-10-22: 300 mg via ORAL
  Filled 2018-10-22: qty 4

## 2018-10-22 MED ORDER — MAGNESIUM SULFATE 2 GM/50ML IV SOLN
2.0000 g | Freq: Once | INTRAVENOUS | Status: AC
  Administered 2018-10-22: 2 g via INTRAVENOUS
  Filled 2018-10-22: qty 50

## 2018-10-22 NOTE — Progress Notes (Signed)
PROGRESS NOTE    Carla EvesBrandi D Cavanaugh  ZOX:[id number] DOB: 09/25/1970 DOA: 10/19/2018 PCP: Tresa GarterPlotnikov, Aleksei V, MD    Brief Narrative:  48 year old female who presented with nausea, vomiting,andleft eye pain.She does have significant past medical history for Barrett's esophagus and hypertension. Was recently diagnosed with a 9 mm right ICA aneurysm which repair was on hold due to current COVID pandemic.Her initial physical examination blood pressure 138/96, heart rate 89, respirate 16, temperature 98, oxygen saturation 98%. She had mild proptosis of the left eye, subconjunctival hematoma and positive discomfort with eye movement. Moist mucous membranes, lungs clear to auscultation bilaterally, heart S1-S2 present rhythmic, abdomen soft, no lower extremity edema. CT angiography of the head showed abnormal fat stranding involving the intraconal fat of the left orbit with associated extraocular muscle enlargement and engorgement of the left superior orbital vein, findings concerning for possible cavernous carotid fistula or arteriovenous shunt about the cavernous sinus.Chest film with no infiltrates, left base pleural thickening, costophrenic angle.  Patient was admitted to the hospital with a working diagnosis ofleftcarotid cavernous fistula.   Assessment & Plan:   Principal Problem:   Carotid-cavernous fistula Active Problems:   Essential hypertension   Hypokalemia   Hypercalcemia   Aneurysm of right internal carotid artery   1. Large fast flow Left internal carotid artery-cavernous fistula with primary drainage into the Lt cavernous sinus.Positive left periorbital ecchymosis, with conjucntival bleeding and edema. No eye pain. Improved nausea and vomiting. Plan for cerebral angiogram with embolization to the left ICA cavernous fistula in am. Patient has received asa and clopidogrel today.   2. HTN. Blood pressure 138/99 this am will continue with low dose HCTZ for blood pressure  control.    3. Persistent Hypokalemia with hypomagnesemia with improved hypercalcemia. Aggressive K correction 40 meq Kcl x2 per PO, IV mag sulfate 2 grams. Patient is tolerating po well, improved nausea and vomiting, will discontinue iV fluids for now. Follow on renal panel in am.   4. Dyspepsia. Improved nausea and vomiting, continue antiacid therapy and as needed antiemetics.  5. Anxiety. Will continue diazepam for anxiety.  DVT prophylaxis:scd Code Status:full Family Communication:no family at the bedside Disposition Plan/ discharge barriers:pending IR procedure 05/05.   Body mass index is 16.3 kg/m. Malnutrition Type:      Malnutrition Characteristics:      Nutrition Interventions:     RN Pressure Injury Documentation:     Consultants:   Interventional radiology   Procedures:   Cerebral angiogram 05/02.   Antimicrobials:       Subjective: Patient is feeling better in regards of nausea and vomiting, no chest pain, no dyspnea. No eye pain or headache.   Objective: Vitals:   10/21/18 2050 10/22/18 0000 10/22/18 0536 10/22/18 0846  BP: (!) 138/95 136/89 (!) 143/86 139/90  Pulse: 89 81 76 (!) 103  Resp: 18 18 18 18   Temp: 98.5 F (36.9 C) 98.4 F (36.9 C)  97.9 F (36.6 C)  TempSrc: Oral Oral  Oral  SpO2: 98% 99% 95% 99%  Weight:      Height:        Intake/Output Summary (Last 24 hours) at 10/22/2018 1122 Last data filed at 10/21/2018 2259 Gross per 24 hour  Intake 240 ml  Output 1800 ml  Net -1560 ml   Filed Weights   10/19/18 2206 10/20/18 0642  Weight: 50.3 kg 47.2 kg    Examination:   General: Not in pain or dyspnea, deconditioned  Neurology: Awake and alert, non focal  E ENT: no pallor, no icterus, oral mucosa moist/ right eye with edema and ecchymosis. Positive subconjunctival hematoma.  Cardiovascular: No JVD. S1-S2 present, rhythmic, no gallops, rubs, or murmurs. No lower extremity edema. Pulmonary: positive breath  sounds bilaterally, adequate air movement, no wheezing, rhonchi or rales. Gastrointestinal. Abdomen with no organomegaly, non tender, no rebound or guarding Skin. No rashes Musculoskeletal: no joint deformities     Data Reviewed: I have personally reviewed following labs and imaging studies  CBC: Recent Labs  Lab 10/19/18 1837 10/21/18 0401  WBC 11.9* 14.9*  NEUTROABS  --  9.8*  HGB 16.5* 12.2  HCT 48.3* 35.7*  MCV 86.6 87.3  PLT 350 271   Basic Metabolic Panel: Recent Labs  Lab 10/19/18 1837 10/20/18 0540 10/20/18 1541 10/21/18 0401 10/22/18 0514  NA 140  --  138 139 141  K 3.0*  --  3.5 2.6* 3.0*  CL 100  --  105 103 107  CO2 22  --  17* 22 24  GLUCOSE 123*  --  108* 92 89  BUN 11  --  13 12 11   CREATININE 0.93  --  0.80 0.71 0.86  CALCIUM 12.7* 11.2* 9.4 9.2 9.0  MG  --   --   --   --  1.7   GFR: Estimated Creatinine Clearance: 59.6 mL/min (by C-G formula based on SCr of 0.86 mg/dL). Liver Function Tests: Recent Labs  Lab 10/19/18 1837  AST 25  ALT 12  ALKPHOS 57  BILITOT 1.4*  PROT 8.6*  ALBUMIN 5.2*   Recent Labs  Lab 10/19/18 1837  LIPASE 23   No results for input(s): AMMONIA in the last 168 hours. Coagulation Profile: Recent Labs  Lab 10/22/18 0923  INR 1.0   Cardiac Enzymes: No results for input(s): CKTOTAL, CKMB, CKMBINDEX, TROPONINI in the last 168 hours. BNP (last 3 results) No results for input(s): PROBNP in the last 8760 hours. HbA1C: No results for input(s): HGBA1C in the last 72 hours. CBG: No results for input(s): GLUCAP in the last 168 hours. Lipid Profile: No results for input(s): CHOL, HDL, LDLCALC, TRIG, CHOLHDL, LDLDIRECT in the last 72 hours. Thyroid Function Tests: No results for input(s): TSH, T4TOTAL, FREET4, T3FREE, THYROIDAB in the last 72 hours. Anemia Panel: No results for input(s): VITAMINB12, FOLATE, FERRITIN, TIBC, IRON, RETICCTPCT in the last 72 hours.    Radiology Studies: I have reviewed all of the  imaging during this hospital visit personally     Scheduled Meds: . aspirin  325 mg Oral Daily  . [START ON 10/23/2018] clopidogrel  75 mg Oral Daily  . hydrochlorothiazide  12.5 mg Oral Daily   Continuous Infusions: . dextrose 5% lactated ringers 75 mL/hr at 10/20/18 2112     LOS: 2 days        Jahdai Padovano Annett Gula, MD

## 2018-10-22 NOTE — Anesthesia Preprocedure Evaluation (Addendum)
Anesthesia Evaluation  Patient identified by MRN, date of birth, ID band Patient awake    Reviewed: Allergy & Precautions, NPO status , Patient's Chart, lab work & pertinent test results  History of Anesthesia Complications (+) PONV  Airway Mallampati: II  TM Distance: >3 FB Neck ROM: Full    Dental  (+) Teeth Intact, Dental Advisory Given   Pulmonary asthma , COPD,    Pulmonary exam normal breath sounds clear to auscultation       Cardiovascular hypertension, Normal cardiovascular exam Rhythm:Regular Rate:Normal  TTE 2019 unremarkable, EF 60-65%   Neuro/Psych  Headaches, Anxiety Depression Left ICA cavernous fistula    GI/Hepatic Neg liver ROS, hiatal hernia, GERD  Controlled,  Endo/Other  negative endocrine ROS  Renal/GU negative Renal ROS     Musculoskeletal negative musculoskeletal ROS (+)   Abdominal   Peds  Hematology negative hematology ROS (+)   Anesthesia Other Findings Day of surgery medications reviewed with the patient.  Reproductive/Obstetrics                            Anesthesia Physical Anesthesia Plan  ASA: II  Anesthesia Plan: General   Post-op Pain Management:    Induction: Intravenous  PONV Risk Score and Plan: 4 or greater and Treatment may vary due to age or medical condition, Ondansetron, Midazolam and Propofol infusion  Airway Management Planned: Oral ETT  Additional Equipment: Arterial line  Intra-op Plan:   Post-operative Plan: Extubation in OR  Informed Consent: I have reviewed the patients History and Physical, chart, labs and discussed the procedure including the risks, benefits and alternatives for the proposed anesthesia with the patient or authorized representative who has indicated his/her understanding and acceptance.     Dental advisory given  Plan Discussed with: CRNA  Anesthesia Plan Comments:       Anesthesia Quick  Evaluation

## 2018-10-22 NOTE — TOC Initial Note (Signed)
Transition of Care [facility]) - Initial/Assessment Note    Patient Details  Name: [name] MRN: [id number] Date of Birth: 1970-11-27  Transition of Care [facility]) CM/SW Contact:    Kermit Balo, RN Phone Number: 10/22/2018, 2:10 PM  Clinical Narrative:                 Pt denies issues obtaining or taking home meds.  Pt denies issues with transportation.  Expected Discharge Plan: Home/Self Care Barriers to Discharge: Continued Medical Work up   Patient Goals and CMS Choice        Expected Discharge Plan and Services Expected Discharge Plan: Home/Self Care       Living arrangements for the past 2 months: Single Family Home(one level)                                      Prior Living Arrangements/Services Living arrangements for the past 2 months: Single Family Home(one level) Lives with:: Siblings(sister and brother in Social worker) Patient language and need for interpreter reviewed:: Yes(no needs) Do you feel safe going back to the place where you live?: Yes        Care giver support system in place?: Yes (comment)(states she has 24 hour supervision between sister and BIL) Current home services: DME(walker) Criminal Activity/Legal Involvement Pertinent to Current Situation/Hospitalization: No - Comment as needed  Activities of Daily Living Home Assistive Devices/Equipment: None ADL Screening (condition at time of admission) Patient's cognitive ability adequate to safely complete daily activities?: Yes Is the patient deaf or have difficulty hearing?: No Does the patient have difficulty seeing, even when wearing glasses/contacts?: No Does the patient have difficulty concentrating, remembering, or making decisions?: No Patient able to express need for assistance with ADLs?: Yes Does the patient have difficulty dressing or bathing?: No Independently performs ADLs?: Yes (appropriate for developmental age) Does the patient have difficulty walking or climbing stairs?:  No Weakness of Legs: None Weakness of Arms/Hands: None  Permission Sought/Granted                  Emotional Assessment Appearance:: Appears stated age Attitude/Demeanor/Rapport: Engaged Affect (typically observed): Accepting, Appropriate, Pleasant Orientation: : Oriented to Self, Oriented to Place, Oriented to  Time, Oriented to Situation   Psych Involvement: No (comment)  Admission diagnosis:  Hypercalcemia [E83.52] 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 Hypercalcemia 10/20/2018  . Aneurysm of right internal carotid artery 10/20/2018  . 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 Tinnitus 10/19/2018  . Flu-like symptoms 08/17/2018  . Bacterial vaginosis 06/21/2018  . Aspiration pneumonia (HCC) 06/15/2018  . Syncope 06/14/2018  . Elevated troponin likely from demand ischemia 06/14/2018  . Malnutrition of moderate degree 06/14/2018  . Aspiration pneumonia of right lower lobe (HCC)   . Pulmonary emphysema (HCC)   . Dysphagia   . CAP (community acquired pneumonia) 06/13/2018  . Hemoptysis 06/13/2018  . S/P Nissen fundoplication (without gastrostomy tube) procedure 12/26/2017  . Hiatal hernia 11/30/2017  . Hypokalemia 11/30/2017  . Barrett's esophagus 11/28/2017  . Essential hypertension 06/29/2017  . Gingivitis 10/14/2016  . Gout 01/03/2014  . Pityriasis rosea 05/14/2013  . VENOUS INSUFFICIENCY, CHRONIC 12/04/2009  . Dermatophytosis of body 12/02/2009  . OTHER SPECIFIED DISEASE OF NAIL 12/02/2009  . OTITIS MEDIA 11/10/2009  . B12 deficiency 10/01/2009  . OTITIS EXTERNA 10/01/2009  . CERUMEN IMPACTION  10/01/2009  . Anxiety state 08/21/2009  . Vitamin D deficiency 02/09/2009  . DIZZINESS 02/09/2009  . Other malaise and fatigue 02/09/2009  . UNSPECIFIED ANEMIA 12/12/2008  . Palpitations 12/12/2008  . SINUSITIS- ACUTE-NOS 09/26/2008  . ACUTE BRONCHOSPASM 09/26/2008  .  Depression 09/09/2008  . ONYCHOMYCOSIS 05/26/2008  . Migraine headache 05/26/2008  . Allergic rhinitis 05/26/2008  . Asthma 05/26/2008  . GERD 05/26/2008  . HEMOPTYSIS 01/13/2007  . DVT, HX OF 01/13/2007   PCP:  Tresa GarterPlotnikov, Aleksei V, MD Pharmacy:   [facility] - [place], [place] - [address]  [place] [id number] Phone: [phone] Fax: [phone]  [id number] [place] [address] [place] [id number] Phone: [phone] Fax: [phone]     Social Determinants of Health (SDOH) Interventions    Readmission Risk Interventions No flowsheet data found.

## 2018-10-22 NOTE — Plan of Care (Signed)
  Problem: Nutrition: Goal: Adequate nutrition will be maintained Outcome: Progressing   Problem: Clinical Measurements: Goal: Ability to maintain clinical measurements within normal limits will improve Outcome: Progressing

## 2018-10-22 NOTE — Consult Note (Signed)
Chief Complaint: Patient was seen in consultation today for left ICA cavernous fistula/embolization.  Referring Physician(s): Hillary Bow  Supervising Physician: Julieanne Cotton  Patient Status: [address]  History of Present Illness: [name] is a 48 y.o. female with a past medical history of hypertension, migraines, pulmonary embolism 2005, DVT 2005, hiatal hernia, GERD, Barrett's esophagus, iron deficiency anemia, vitamin D deficiency, B12 deficiency, gout, anxiety, and depression. She is known to [facility] and has been followed by Dr. Corliss Skains since 07/2018. She first presented to our department at the request of Dr. Anne Hahn for management of bilateral ICA aneurysms. She consulted with Dr. Corliss Skains 08/17/2018 to discuss management options. At that time, patient decided to pursue endovascular intervention for her aneurysms. Unfortunately, due to restrictions regarding COVID-19, this has been postponed.  Patient presented to [facility] ED 10/19/2018 with complaint of tinnitus and left eye pain. 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 At that time, patient decided to pursue endovascular intervention for her left ICA cavernous fistula.  CTA head/neck 10/20/2018: 1. Abnormal fat stranding involving the intraconal fat of the left orbit with associated extraocular muscle enlargement and engorgement of the left superior orbital vein. Findings concerning for possible cavernous carotid fistula or other arteriovenous shunt about the cavernous sinus. Further assessment with dedicated catheter directed arteriogram recommended for further evaluation. 2. Otherwise stable CTA with multiple segments of ectasia involving the carotid and vertebral arteries bilaterally. Associated saccular aneurysm involving the distal  cervical right ICA measuring up to 8 mm stable from previous. Again, findings may reflect fibromuscular dysplasia or underlying connective tissue disorder. 3. No large vessel occlusion, arterial dissection, or hemodynamically significant stenosis identified. 4. No other acute intracranial abnormality.  Patient presents for possible image-guided cerebral arteriogram with possible embolization of left ICA cavernous fistula. Patient awake and alert standing in room. Complains of left eye pain, stable since admission. Denies fever, chills, chest pain, dyspnea, abdominal pain, or headache.   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---   Depression    GERD (gastroesophageal reflux disease)    Hiatal hernia    History of DVT of lower extremity    08-14-2003  RIGHT LOWER EXTREMITY-- TREATED W/ COUMADIN----  per pt none since   History of palpitations    Hypertension    Idiopathic chronic gout    12-20-2017  per pt stable ,   last episode 02/ 2019   Iron deficiency anemia    Migraines    Personal history of PE (pulmonary embolism)    03/ 2005 RIGHT SIDE  --- treated w/ coumadin---  per pt none since   PONV (postoperative nausea and vomiting)    Tinea versicolor    Vitamin D deficiency     Past Surgical History:  Procedure Laterality Date   BIOPSY  11/29/2017   Procedure: BIOPSY;  Surgeon: [name], MD;  Location: [facility];  Service: Endoscopy;;   BRONCHOSCOPY  05-20-2004  dr clance   ESOPHAGOGASTRODUODENOSCOPY (EGD) WITH PROPOFOL N/A 11/29/2017   Procedure: ESOPHAGOGASTRODUODENOSCOPY (EGD) WITH PROPOFOL;  Surgeon: [name], MD;  Location: [facility];  Service: Endoscopy;  Laterality: N/A;   EXPLORATORY LAPAROTOMY  AGE 86    [facility]   for constipation   INSERTION OF MESH N/A 12/26/2017    Procedure: INSERTION OF BIO A MESH;  Surgeon: [name], MD;  Location: [facility] ORS;  Service: General;  Laterality: N/A;   KNEE ARTHROSCOPY W/ ACL RECONSTRUCTION Right 09/2003   LAPAROSCOPY BILATERAL TUBAL FULGERATION AND ATTEMPTED THERMACHOICE ABLATION WITH UTERINE PERFORATION  07-17-2009   dr meisinger  [facility]   LUNG LOBECTOMY  1991   right lower lobe for massive hemoptyosis   MINI-THORACOTOMY WEDGE RESECTION OF LEFT LOWER LOBE CYST Left 06-24-2004   dr Edwyna Shell [facility]   hemoptyosis    Allergies: Guaifenesin; Iron; Celexa [citalopram hydrobromide]; Codeine; Doxepin; Hydrocodone-acetaminophen; Hydromorphone hcl; Metoprolol succinate; Morphine; Pantoprazole sodium; Pseudoeph-doxylamine-dm-apap; Pseudoephedrine; Sulfa antibiotics; Prednisone; and Tetracyclines & related  Medications: Prior to Admission medications   Medication Sig Start Date End Date Taking? Authorizing Provider  allopurinol (ZYLOPRIM) 100 MG tablet Take 1 tablet (100 mg total) by mouth daily. Must keep visit on 11/03/17 to get refills Patient taking differently: Take 100 mg by mouth daily as needed (for gout flare up). Must keep visit on 11/03/17 to get refills 11/03/17  Yes Plotnikov, Georgina Quint, MD  aspirin-acetaminophen-caffeine (EXCEDRIN MIGRAINE) [phone] MG tablet Take 1 tablet by mouth every 6 (six) hours as needed for headache or migraine.    Yes [provider]  cetirizine (ZYRTEC) 10 MG tablet Take 10 mg by mouth daily as needed for allergies.   Yes [provider]  Cholecalciferol (VITAMIN D3) 2000 units TABS Take 2,000 Units by mouth daily.    Yes [provider]  diazepam (VALIUM) 5 MG tablet TAKE 1 TABLET BY MOUTH EVERY 12 HOURS IF NEEDED FOR ANXIETY Patient taking differently: Take 5 mg by mouth every 12 (twelve) hours as needed for anxiety. take 1 tablet by mouth every 12 hours if needed for anxiety 05/16/18  Yes Plotnikov, Georgina Quint, MD  ferrous gluconate (IRON 27) 240 (27 FE) MG  tablet Take 240 mg by mouth daily.    Yes [provider]  hydrochlorothiazide (HYDRODIURIL) 25 MG tablet Take 25 mg by mouth daily.    Yes [provider]  Multiple Vitamins-Minerals (MULTIVITAMIN ADULTS PO) Take 1 tablet by mouth daily.    Yes [provider]  potassium chloride SA (K-DUR,KLOR-CON) 20 MEQ tablet Take 2 tablets (40 mEq total) by mouth every morning. Patient taking differently: Take 20 mEq by mouth daily.  06/16/18  Yes Hongalgi, Maximino Greenland, MD  promethazine (PHENERGAN) 12.5 MG tablet Take 1 tablet (12.5 mg total) by mouth every 8 (eight) hours as needed for nausea or vomiting. 10/19/18  Yes Myrlene Broker, MD  metroNIDAZOLE (FLAGYL) 500 MG tablet Take 1 tablet (500 mg total) by mouth 2 (two) times daily. Patient not taking: Reported on 07/11/2018 06/21/18   Plotnikov, Georgina Quint, MD  oseltamivir (TAMIFLU) 75 MG capsule Take 1 capsule (75 mg total) by mouth 2 (two) times daily. Patient not taking: Reported on 10/20/2018 08/17/18   Myrlene Broker, MD     Family History  Problem Relation Age of Onset   Hypertension Other    Diabetes Other     Social History   Socioeconomic History   Marital status: Single    Spouse name: Not on file   Number of children: Not on file   Years of education: Not on file   Highest education level: Not on file  Occupational History   Not on file  Social Network engineer strain: Not on file   Food insecurity:    Worry: Not on file    Inability: Not on file   Transportation needs:    Medical: Not on file    Non-medical: Not on file  Tobacco Use   Smoking status: Never Smoker   Smokeless tobacco: Never Used  Substance and Sexual Activity   Alcohol use: Yes    Comment: rare   Drug use: No   Sexual activity: Not on file    Comment: BTL  Lifestyle   Physical activity:    Days per week: Not on file    Minutes per session: Not on file   Stress: Not on file  Relationships    Social connections:    Talks on phone: Not on file    Gets together: Not on file    Attends religious service: Not on file    Active member of club or organization: Not on file    Attends meetings of clubs or organizations: Not on file    Relationship status: Not on file  Other Topics Concern   Not on file  Social History Narrative   Not on file     Review of Systems: A 12 point ROS discussed and pertinent positives are indicated in the HPI above.  All other systems are negative.  Review of Systems  Constitutional: Negative for chills and fever.  Eyes: Positive for pain.  Respiratory: Negative for shortness of breath and wheezing.   Cardiovascular: Negative for chest pain and palpitations.  Gastrointestinal: Negative for abdominal pain.  Neurological: Negative for headaches.  Psychiatric/Behavioral: Negative for behavioral problems and confusion.    Vital Signs: BP 139/90 (BP Location: Left Arm)    Pulse (!) 103    Temp 97.9 F (36.6 C) (Oral)    Resp 18    Ht  (1.702 m)    Wt 104 lb 0.9 oz (47.2 kg)    SpO2 99%    BMI 16.30 kg/m   Physical Exam Vitals signs and nursing note reviewed.  Constitutional:      General: She is not in acute distress.    Appearance: Normal appearance.  Cardiovascular:     Rate and Rhythm: Normal rate and regular rhythm.     Heart sounds: Normal heart sounds. No murmur.  Pulmonary:     Effort: Pulmonary effort is normal. No respiratory distress.     Breath sounds: Normal breath sounds. No wheezing.  Skin:    General: Skin is warm and dry.  Neurological:     Mental Status: She is alert and oriented to person, place, and time.  Psychiatric:        Mood and Affect: Mood normal.        Behavior: Behavior normal.        Thought Content: Thought content normal.        Judgment: Judgment normal.      MD Evaluation Airway: WNL Heart: WNL Abdomen: WNL Chest/ Lungs: WNL ASA  Classification: 3 Mallampati/Airway Score:  One   Imaging: Ct Angio Head W Or Wo Contrast  Result Date: 10/20/2018 CLINICAL DATA:  Initial evaluation for acute headache. EXAM: CT ANGIOGRAPHY HEAD AND NECK TECHNIQUE: Multidetector CT imaging of the head and neck was performed using the standard protocol during bolus administration of intravenous contrast. Multiplanar CT image reconstructions and MIPs were obtained to evaluate the vascular anatomy. Carotid stenosis measurements (when applicable) are obtained utilizing NASCET criteria, using the distal  internal carotid diameter as the denominator. CONTRAST:  OMNIPAQUE IOHEXOL 350 MG/ML SOLN COMPARISON:  Prior CTA from 06/29/2018. FINDINGS: CT HEAD FINDINGS Brain: Cerebral volume stable, and within normal limits. No acute intracranial hemorrhage. No acute large vessel territory infarct. No mass lesion, midline shift or mass effect. No hydrocephalus. No extra-axial fluid collection. Vascular: No asymmetric hyperdense vessel. Skull: Scalp soft tissues demonstrate no acute finding. Calvarium intact. Sinuses: Air-fluid level noted within the dominant left sphenoid sinus. Paranasal sinuses are otherwise clear. Mastoid air cells and middle ear cavities are well pneumatized and free of fluid. Soft tissue density within the left EAC likely reflects cerumen. Orbits: Asymmetric soft tissue stranding seen involving the intraconal fat of the left orbit (series 5, image 118). Left lateral rectus muscle appears enlarged, new from previous. Globes remain symmetric in size with normal morphology and appearance. Review of the MIP images confirms the above findings CTA NECK FINDINGS Aortic arch: Visualized aortic arch normal in caliber with normal 3 vessel morphology. Minimal plaque at the origin of the left subclavian artery. No flow-limiting stenosis about the origin of the great vessels. Subclavian arteries otherwise widely patent. Right carotid system: Right common carotid artery widely patent from its origin to the  bifurcation without abnormality or stenosis. No atheromatous narrowing about the right carotid bifurcation. Multiple segments of ectasias again seen involving the mid-distal right ICA measuring up to 9 mm in maximal diameter, similar to previous. Superimposed saccular aneurysm at the distal right ICA just proximal to the skull base relatively stable measuring 8 x 7 x 8 mm (transverse by craniocaudad by AP, series 12, image 111). No dissection or stenosis. Left carotid system: Left common carotid artery patent from its origin to the bifurcation without abnormality. No atheromatous narrowing about the left bifurcation. Aneurysmal dilatation of the distal left ICA up to 12 mm, relatively similar. No dissection or stenosis. Vertebral arteries: Both of the vertebral arteries arise from the subclavian arteries. Left vertebral artery dominant. No evidence for dissection or flow-limiting stenosis. Focal ectasia up at the level of C2 measuring up to 6 mm on the left and 5 mm on the right, stable. Skeleton: Mild multilevel cervical spondylolysis without significant spinal stenosis, stable. Other neck: No other acute soft tissue abnormality within the neck. Few subcentimeter hypodense thyroid nodules noted, of doubtful significance. Upper chest: Visualized upper chest demonstrates no acute finding. Review of the MIP images confirms the above findings CTA HEAD FINDINGS Anterior circulation: Petrous segments patent bilaterally. Cavernous/supraclinoid ICAs patent without flow-limiting stenosis or definite abnormality. Adjacent venous contamination within the cavernous sinus limits evaluation. There is asymmetric engorgement and filling of the left superior orbital vein, raising the possibility for CC fistula (series 12, image 71). Additionally, there is asymmetric filling of the left inferior petrosal sinus. A1 segments patent bilaterally. Normal anterior communicating artery. Anterior cerebral arteries well perfused to their  distal aspects. M1 segments patent bilaterally. Distal MCA branches well perfused and symmetric. Posterior circulation: Left vertebral artery ectatic measuring up to 5 mm, similar to previous. Right vertebral artery diffusely hypoplastic. Posterior inferior cerebral arteries patent proximally. Basilar patent to its distal aspect. Superior cerebral arteries perfuse bilaterally. Both of the PCAs patent to their distal aspects without stenosis. Venous sinuses: Concern for left-sided CC fistula or possibly dural AVF as above. Venous sinuses otherwise grossly patent allowing for timing of the contrast bolus. Anatomic variants: Tiny right posterior communicating artery noted. No other significant anatomic variant. No appreciable aneurysm. Delayed phase: No abnormal enhancement. Review  of the MIP images confirms the above findings IMPRESSION: 1. Abnormal fat stranding involving the intraconal fat of the left orbit with associated extraocular muscle enlargement and engorgement of the left superior orbital vein. Findings concerning for possible cavernous carotid fistula or other arteriovenous shunt about the cavernous sinus. Further assessment with dedicated catheter directed arteriogram recommended for further evaluation. 2. Otherwise stable CTA with multiple segments of ectasia involving the carotid and vertebral arteries bilaterally. 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 Again, findings may reflect fibromuscular dysplasia or underlying connective tissue disorder. 3. No large vessel occlusion, arterial dissection, or hemodynamically significant stenosis identified. 4. No other acute intracranial abnormality. Electronically Signed   By: Rise Mu M.D.   On: 10/20/2018 02:29   Ct Angio Neck W And/or Wo Contrast  Result Date: 10/20/2018 CLINICAL DATA:  Initial evaluation for acute headache. EXAM: CT ANGIOGRAPHY HEAD AND NECK TECHNIQUE:  Multidetector CT imaging of the head and neck was performed using the standard protocol during bolus administration of intravenous contrast. Multiplanar CT image reconstructions and MIPs were obtained to evaluate the vascular anatomy. Carotid stenosis measurements (when applicable) are obtained utilizing NASCET criteria, using the distal internal carotid diameter as the denominator. CONTRAST:  OMNIPAQUE IOHEXOL 350 MG/ML SOLN COMPARISON:  Prior CTA from 06/29/2018. FINDINGS: CT HEAD FINDINGS Brain: Cerebral volume stable, and within normal limits. No acute intracranial hemorrhage. No acute large vessel territory infarct. No mass lesion, midline shift or mass effect. No hydrocephalus. No extra-axial fluid collection. Vascular: No asymmetric hyperdense vessel. Skull: Scalp soft tissues demonstrate no acute finding. Calvarium intact. Sinuses: Air-fluid level noted within the dominant left sphenoid sinus. Paranasal sinuses are otherwise clear. Mastoid air cells and middle ear cavities are well pneumatized and free of fluid. Soft tissue density within the left EAC likely reflects cerumen. Orbits: Asymmetric soft tissue stranding seen involving the intraconal fat of the left orbit (series 5, image 118). Left lateral rectus muscle appears enlarged, new from previous. Globes remain symmetric in size with normal morphology and appearance. Review of the MIP images confirms the above findings CTA NECK FINDINGS Aortic arch: Visualized aortic arch normal in caliber with normal 3 vessel morphology. Minimal plaque at the origin of the left subclavian artery. No flow-limiting stenosis about the origin of the great vessels. Subclavian arteries otherwise widely patent. Right carotid system: Right common carotid artery widely patent from its origin to the bifurcation without abnormality or stenosis. No atheromatous narrowing about the right carotid bifurcation. Multiple segments of ectasias again seen involving the mid-distal  right ICA measuring up to 9 mm in maximal diameter, similar to previous. Superimposed saccular aneurysm at the distal right ICA just proximal to the skull base relatively stable measuring 8 x 7 x 8 mm (transverse by craniocaudad by AP, series 12, image 111). No dissection or stenosis. Left carotid system: Left common carotid artery patent from its origin to the bifurcation without abnormality. No atheromatous narrowing about the left bifurcation. Aneurysmal dilatation of the distal left ICA up to 12 mm, relatively similar. No dissection or stenosis. Vertebral arteries: Both of the vertebral arteries arise from the subclavian arteries. Left vertebral artery dominant. No evidence for dissection or flow-limiting stenosis. Focal ectasia up at the level of C2 measuring up to 6 mm on the left and 5 mm on the right, stable. Skeleton: Mild multilevel cervical spondylolysis without significant spinal stenosis, stable. Other neck: No other acute soft tissue abnormality within the  neck. Few subcentimeter hypodense thyroid nodules noted, of doubtful significance. Upper chest: Visualized upper chest demonstrates no acute finding. Review of the MIP images confirms the above findings CTA HEAD FINDINGS Anterior circulation: Petrous segments patent bilaterally. Cavernous/supraclinoid ICAs patent without flow-limiting stenosis or definite abnormality. Adjacent venous contamination within the cavernous sinus limits evaluation. There is asymmetric engorgement and filling of the left superior orbital vein, raising the possibility for CC fistula (series 12, image 71). Additionally, there is asymmetric filling of the left inferior petrosal sinus. A1 segments patent bilaterally. Normal anterior communicating artery. Anterior cerebral arteries well perfused to their distal aspects. M1 segments patent bilaterally. Distal MCA branches well perfused and symmetric. Posterior circulation: Left vertebral artery ectatic measuring up to 5 mm,  similar to previous. Right vertebral artery diffusely hypoplastic. Posterior inferior cerebral arteries patent proximally. Basilar patent to its distal aspect. Superior cerebral arteries perfuse bilaterally. Both of the PCAs patent to their distal aspects without stenosis. Venous sinuses: Concern for left-sided CC fistula or possibly dural AVF as above. Venous sinuses otherwise grossly patent allowing for timing of the contrast bolus. Anatomic variants: Tiny right posterior communicating artery noted. No other significant anatomic variant. No appreciable aneurysm. Delayed phase: No abnormal enhancement. Review of the MIP images confirms the above findings IMPRESSION: 1. Abnormal fat stranding involving the intraconal fat of the left orbit with associated extraocular muscle enlargement and engorgement of the left superior orbital vein. Findings concerning for possible cavernous carotid fistula or other arteriovenous shunt about the cavernous sinus. Further assessment with dedicated catheter directed arteriogram recommended for further evaluation. 2. Otherwise stable CTA with multiple segments of ectasia involving the carotid and vertebral arteries bilaterally. 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 Again, findings may reflect fibromuscular dysplasia or underlying connective tissue disorder. 3. No large vessel occlusion, arterial dissection, or hemodynamically significant stenosis identified. 4. No other acute intracranial abnormality. Electronically Signed   By: Rise MuBenjamin  McClintock M.D.   On: 10/20/2018 02:29   Dg Chest Port 1 View  Result Date: 10/20/2018 CLINICAL DATA:  Hypercalcemia. EXAM: PORTABLE CHEST 1 VIEW COMPARISON:  CT chest and chest x-ray dated July 11, 2018. FINDINGS: The heart size and mediastinal contours are within normal limits. Normal pulmonary vascularity. Chronic pleural thickening at the bilateral costophrenic angles  is unchanged. No focal consolidation, pleural effusion, or pneumothorax. No acute osseous abnormality. IMPRESSION: No active disease. Electronically Signed   By: Obie DredgeWilliam T Derry M.D.   On: 10/20/2018 05:55    Labs:  CBC: Recent Labs    06/16/18 0533 07/11/18 1300 10/19/18 1837 10/21/18 0401  WBC 11.3* 15.8* 11.9* 14.9*  HGB 11.0* 14.2 16.5* 12.2  HCT 34.7* 42.8 48.3* 35.7*  PLT 288 332 350 271    COAGS: Recent Labs    10/22/18 0923  INR 1.0    BMP: Recent Labs    10/19/18 1837 10/20/18 0540 10/20/18 1541 10/21/18 0401 10/22/18 0514  NA 140  --  138 139 141  K 3.0*  --  3.5 2.6* 3.0*  CL 100  --  105 103 107  CO2 22  --  17* 22 24  GLUCOSE 123*  --  108* 92 89  BUN 11  --  13 12 11   CALCIUM 12.7* 11.2* 9.4 9.2 9.0  CREATININE 0.93  --  0.80 0.71 0.86  GFRNONAA >60  --  >60 >60 >60  GFRAA >60  --  >60 >60 >60    LIVER  FUNCTION TESTS: Recent Labs    11/28/17 0434 11/29/17 0446 07/11/18 1300 10/19/18 1837  BILITOT 0.9 0.7 1.5* 1.4*  AST 24 20 45* 25  ALT ALKPHOS 55 46 49 57  PROT 7.5 6.5 7.6 8.6*  ALBUMIN 4.2 3.7 4.3 5.2*     Assessment and Plan:  Left ICA cavernous fistula. Plan for image-guided cerebral arteriogram with possible embolization of left ICA cavernous fistula tentatively for Tuesday 10/23/2018 at 0800 with Dr. Corliss Skains. Patient will be NPO at midnight. Afebrile. Awaiting COVID-19 results. Ok to proceed with Plavix and Aspirin use per Dr. Corliss Skains- load with 300 mg Plavix this AM, then 75 mg Plavix tonight, then Plavix 75 mg QD. INR 1.0 seconds today. P2Y12 pending.  Risks and benefits of cerebral arteriogram with intervention were discussed with the patient including, but not limited to bleeding, infection, vascular injury, contrast induced renal failure, stroke or even death. This interventional procedure involves the use of X-rays and because of the nature of the planned procedure, it is possible that we will have  prolonged use of X-ray fluoroscopy. Potential radiation risks to you include (but are not limited to) the following: - A slightly elevated risk for cancer  several years later in life. This risk is typically less than 0.5% percent. This risk is low in comparison to the normal incidence of human cancer, which is 33% for women and 50% for men according to the [employer]. - Radiation induced injury can include skin redness, resembling a rash, tissue breakdown / ulcers and hair loss (which can be temporary or permanent).  The likelihood of either of these occurring depends on the difficulty of the procedure and whether you are sensitive to radiation due to previous procedures, disease, or genetic conditions.  IF your procedure requires a prolonged use of radiation, you will be notified and given written instructions for further action.  It is your responsibility to monitor the irradiated area for the 2 weeks following the procedure and to notify your physician if you are concerned that you have suffered a radiation induced injury.   All of the patient's questions were answered, patient is agreeable to proceed. Consent signed and in chart.   Thank you for this interesting consult.  I greatly enjoyed meeting [name] and look forward to participating in their care.  A copy of this report was sent to the requesting provider on this date.  Electronically Signed: Elwin Mocha, PA-C 10/22/2018, 11:43 AM   I spent a total of 40 Minutes in face to face in clinical consultation, greater than 50% of which was counseling/coordinating care for left ICA cavernous fistula/embolization.

## 2018-10-22 NOTE — Telephone Encounter (Signed)
I did not call the patient, she was admitted to the hospital with a carotid cavernous sinus fistula.  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

## 2018-10-22 NOTE — Telephone Encounter (Signed)
Noted  

## 2018-10-23 ENCOUNTER — Inpatient Hospital Stay (HOSPITAL_COMMUNITY): Payer: 59 | Admitting: Certified Registered"

## 2018-10-23 ENCOUNTER — Ambulatory Visit (HOSPITAL_COMMUNITY)
Admission: RE | Admit: 2018-10-23 | Discharge: 2018-10-23 | Disposition: A | Discharge status: Home / Self Care | Payer: 59 | Source: Ambulatory Visit | Attending: Interventional Radiology | Admitting: Interventional Radiology

## 2018-10-23 ENCOUNTER — Encounter (HOSPITAL_COMMUNITY)
Admission: EM | Disposition: A | Discharge status: Home / Self Care | Payer: Self-pay | Source: Home / Self Care | Attending: Internal Medicine

## 2018-10-23 DIAGNOSIS — I671 Cerebral aneurysm, nonruptured: Secondary | ICD-10-CM | POA: Diagnosis present

## 2018-10-23 DIAGNOSIS — L988 Other specified disorders of the skin and subcutaneous tissue: Secondary | ICD-10-CM | POA: Insufficient documentation

## 2018-10-23 HISTORY — PX: RADIOLOGY WITH ANESTHESIA: SHX6223

## 2018-10-23 HISTORY — PX: IR ANGIOGRAM FOLLOW UP STUDY: IMG697

## 2018-10-23 HISTORY — PX: IR ANGIO INTRA EXTRACRAN SEL INTERNAL CAROTID UNI L MOD SED: IMG5361

## 2018-10-23 HISTORY — PX: IR TRANSCATH/EMBOLIZ: IMG695

## 2018-10-23 LAB — MAGNESIUM: Magnesium: 2.2 mg/dL (ref 1.7–2.4)

## 2018-10-23 LAB — BASIC METABOLIC PANEL
Anion gap: 11 (ref 5–15)
BUN: 13 mg/dL (ref 6–20)
CO2: 22 mmol/L (ref 22–32)
Calcium: 9.3 mg/dL (ref 8.9–10.3)
Chloride: 107 mmol/L (ref 98–111)
Creatinine, Ser: 0.76 mg/dL (ref 0.44–1.00)
GFR calc Af Amer: >60 mL/min (ref >60)
GFR calc non Af Amer: >60 mL/min (ref >60)
Glucose, Bld: 94 mg/dL (ref 70–99)
Potassium: 3.7 mmol/L (ref 3.5–5.1)
Sodium: 140 mmol/L (ref 135–145)

## 2018-10-23 LAB — HEPARIN LEVEL (UNFRACTIONATED): Heparin Unfractionated: 0.34 IU/mL (ref 0.30–0.70)

## 2018-10-23 LAB — CBC WITH DIFFERENTIAL/PLATELET
Abs Immature Granulocytes: 0.04 10*3/uL (ref 0.00–0.07)
Basophils Absolute: 0.1 10*3/uL (ref 0.0–0.1)
Basophils Relative: 1 %
Eosinophils Absolute: 0.3 10*3/uL (ref 0.0–0.5)
Eosinophils Relative: 3 %
HCT: 38.6 % (ref 36.0–46.0)
Hemoglobin: 13.2 g/dL (ref 12.0–15.0)
Immature Granulocytes: 0 %
Lymphocytes Relative: 27 %
Lymphs Abs: 3.4 10*3/uL (ref 0.7–4.0)
MCH: 30.1 pg (ref 26.0–34.0)
MCHC: 34.2 g/dL (ref 30.0–36.0)
MCV: 87.9 fL (ref 80.0–100.0)
Monocytes Absolute: 1 10*3/uL (ref 0.1–1.0)
Monocytes Relative: 8 %
Neutro Abs: 8 10*3/uL — ABNORMAL HIGH (ref 1.7–7.7)
Neutrophils Relative %: 61 %
Platelets: 291 10*3/uL (ref 150–400)
RBC: 4.39 MIL/uL (ref 3.87–5.11)
RDW: 13.2 % (ref 11.5–15.5)
WBC: 12.8 10*3/uL — ABNORMAL HIGH (ref 4.0–10.5)
nRBC: 0 % (ref 0.0–0.2)

## 2018-10-23 LAB — POCT ACTIVATED CLOTTING TIME
Activated Clotting Time: 208 seconds
Activated Clotting Time: 202 seconds
Activated Clotting Time: 224 seconds
Activated Clotting Time: 230 seconds

## 2018-10-23 LAB — NOVEL CORONAVIRUS, NAA (HOSP ORDER, SEND-OUT TO REF LAB; TAT 18-24 HRS): SARS-CoV-2, NAA: NOT DETECTED

## 2018-10-23 LAB — SURGICAL PCR SCREEN
MRSA, PCR: NEGATIVE
Staphylococcus aureus: NEGATIVE

## 2018-10-23 LAB — PLATELET INHIBITION P2Y12: Platelet Function  P2Y12: 71 [PRU] — ABNORMAL LOW (ref 182–335)

## 2018-10-23 LAB — GLUCOSE, CAPILLARY: Glucose-Capillary: 151 mg/dL — ABNORMAL HIGH (ref 70–99)

## 2018-10-23 SURGERY — IR WITH ANESTHESIA
Anesthesia: General

## 2018-10-23 MED ORDER — HEPARIN (PORCINE) 25000 UT/250ML-% IV SOLN
INTRAVENOUS | Status: AC
  Filled 2018-10-23: qty 250

## 2018-10-23 MED ORDER — ONDANSETRON HCL 4 MG/2ML IJ SOLN
INTRAMUSCULAR | Status: DC | PRN
  Administered 2018-10-23: 4 mg via INTRAVENOUS

## 2018-10-23 MED ORDER — PROTAMINE SULFATE 10 MG/ML IV SOLN
INTRAVENOUS | Status: DC | PRN
  Administered 2018-10-23: 5 mg via INTRAVENOUS

## 2018-10-23 MED ORDER — CLOPIDOGREL BISULFATE 75 MG PO TABS
75.0000 mg | ORAL_TABLET | ORAL | Status: AC
  Administered 2018-10-23: 75 mg via ORAL
  Filled 2018-10-23: qty 1

## 2018-10-23 MED ORDER — HEPARIN (PORCINE) 25000 UT/250ML-% IV SOLN
500.0000 [IU]/h | INTRAVENOUS | Status: DC
  Filled 2018-10-23: qty 250

## 2018-10-23 MED ORDER — ACETAMINOPHEN 10 MG/ML IV SOLN: 1000.0000 mg | Freq: Once | INTRAVENOUS | Status: DC | PRN

## 2018-10-23 MED ORDER — SUGAMMADEX SODIUM 200 MG/2ML IV SOLN
INTRAVENOUS | Status: DC | PRN
  Administered 2018-10-23: 100 mg via INTRAVENOUS

## 2018-10-23 MED ORDER — HEPARIN (PORCINE) 25000 UT/250ML-% IV SOLN
450.0000 [IU]/h | INTRAVENOUS | Status: AC
  Administered 2018-10-23: 23:00:00 400 [IU]/h via INTRAVENOUS

## 2018-10-23 MED ORDER — ESMOLOL HCL 100 MG/10ML IV SOLN
INTRAVENOUS | Status: DC | PRN
  Administered 2018-10-23: 30 mg via INTRAVENOUS

## 2018-10-23 MED ORDER — NIMODIPINE 30 MG PO CAPS
0.0000 mg | ORAL_CAPSULE | ORAL | Status: DC
  Filled 2018-10-23: qty 1

## 2018-10-23 MED ORDER — NITROGLYCERIN 1 MG/10 ML FOR IR/CATH LAB
INTRA_ARTERIAL | Status: AC
  Filled 2018-10-23: qty 10

## 2018-10-23 MED ORDER — PROPOFOL 500 MG/50ML IV EMUL
INTRAVENOUS | Status: DC | PRN
  Administered 2018-10-23: 30 ug/kg/min via INTRAVENOUS

## 2018-10-23 MED ORDER — HEPARIN SODIUM (PORCINE) 1000 UNIT/ML IJ SOLN
INTRAMUSCULAR | Status: DC | PRN
  Administered 2018-10-23: 1000 [IU] via INTRAVENOUS
  Administered 2018-10-23: 2000 [IU] via INTRAVENOUS

## 2018-10-23 MED ORDER — FENTANYL CITRATE (PF) 100 MCG/2ML IJ SOLN: 25.0000 ug | INTRAMUSCULAR | Status: DC | PRN

## 2018-10-23 MED ORDER — ASPIRIN 325 MG PO TABS
325.0000 mg | ORAL_TABLET | Freq: Every day | ORAL | Status: DC
  Administered 2018-10-24 – 2018-10-30 (×7): 325 mg via ORAL
  Filled 2018-10-23 (×7): qty 1

## 2018-10-23 MED ORDER — SODIUM CHLORIDE 0.9 % IV SOLN
INTRAVENOUS | Status: DC
  Administered 2018-10-23 (×2): via INTRAVENOUS

## 2018-10-23 MED ORDER — NITROGLYCERIN 1 MG/10 ML FOR IR/CATH LAB
INTRA_ARTERIAL | Status: DC | PRN
  Administered 2018-10-23: 25 ug via INTRA_ARTERIAL

## 2018-10-23 MED ORDER — MIDAZOLAM HCL 5 MG/5ML IJ SOLN
INTRAMUSCULAR | Status: DC | PRN
  Administered 2018-10-23: 2 mg via INTRAVENOUS

## 2018-10-23 MED ORDER — SODIUM CHLORIDE 0.9 % IV SOLN
INTRAVENOUS | Status: DC
  Administered 2018-10-23 – 2018-10-27 (×6): via INTRAVENOUS

## 2018-10-23 MED ORDER — ASPIRIN EC 325 MG PO TBEC
325.0000 mg | DELAYED_RELEASE_TABLET | ORAL | Status: AC
  Administered 2018-10-23: 325 mg via ORAL
  Filled 2018-10-23: qty 1

## 2018-10-23 MED ORDER — IOHEXOL 300 MG/ML  SOLN
150.0000 mL | Freq: Once | INTRAMUSCULAR | Status: AC | PRN
  Administered 2018-10-23: 75 mL via INTRA_ARTERIAL

## 2018-10-23 MED ORDER — DEXAMETHASONE SODIUM PHOSPHATE 10 MG/ML IJ SOLN
INTRAMUSCULAR | Status: DC | PRN
  Administered 2018-10-23: 4 mg via INTRAVENOUS

## 2018-10-23 MED ORDER — HEPARIN (PORCINE) 25000 UT/250ML-% IV SOLN
500.0000 [IU]/h | INTRAVENOUS | Status: DC
  Administered 2018-10-23: 500 [IU]/h via INTRAVENOUS
  Filled 2018-10-23: qty 250

## 2018-10-23 MED ORDER — CLEVIDIPINE BUTYRATE 0.5 MG/ML IV EMUL
0.0000 mg/h | INTRAVENOUS | Status: AC
  Filled 2018-10-23: qty 50

## 2018-10-23 MED ORDER — SODIUM CHLORIDE 0.9 % IV SOLN
INTRAVENOUS | Status: DC | PRN
  Administered 2018-10-23: 40 ug/min via INTRAVENOUS

## 2018-10-23 MED ORDER — PROPOFOL 10 MG/ML IV BOLUS
INTRAVENOUS | Status: DC | PRN
  Administered 2018-10-23: 110 mg via INTRAVENOUS

## 2018-10-23 MED ORDER — CEFAZOLIN SODIUM-DEXTROSE 2-4 GM/100ML-% IV SOLN
2.0000 g | INTRAVENOUS | Status: AC
  Administered 2018-10-23: 2 g via INTRAVENOUS
  Filled 2018-10-23: qty 100

## 2018-10-23 MED ORDER — SODIUM CHLORIDE 0.9 % IV SOLN
INTRAVENOUS | Status: DC | PRN
  Administered 2018-10-23: 10:00:00 via INTRAVENOUS

## 2018-10-23 MED ORDER — ROCURONIUM BROMIDE 10 MG/ML (PF) SYRINGE
PREFILLED_SYRINGE | INTRAVENOUS | Status: DC | PRN
  Administered 2018-10-23: 60 mg via INTRAVENOUS
  Administered 2018-10-23: 20 mg via INTRAVENOUS
  Administered 2018-10-23: 40 mg via INTRAVENOUS
  Administered 2018-10-23: 20 mg via INTRAVENOUS

## 2018-10-23 MED ORDER — FENTANYL CITRATE (PF) 250 MCG/5ML IJ SOLN
INTRAMUSCULAR | Status: DC | PRN
  Administered 2018-10-23: 25 ug via INTRAVENOUS
  Administered 2018-10-23: 50 ug via INTRAVENOUS
  Administered 2018-10-23: 25 ug via INTRAVENOUS

## 2018-10-23 MED ORDER — PROPOFOL 500 MG/50ML IV EMUL: INTRAVENOUS | Status: DC | PRN

## 2018-10-23 MED ORDER — PROMETHAZINE HCL 25 MG/ML IJ SOLN
6.2500 mg | INTRAMUSCULAR | Status: AC | PRN
  Administered 2018-10-23 (×2): 6.25 mg via INTRAVENOUS

## 2018-10-23 MED ORDER — PROMETHAZINE HCL 25 MG/ML IJ SOLN
INTRAMUSCULAR | Status: AC
  Filled 2018-10-23: qty 1

## 2018-10-23 MED ORDER — LIDOCAINE 2% (20 MG/ML) 5 ML SYRINGE
INTRAMUSCULAR | Status: DC | PRN
  Administered 2018-10-23: 40 mg via INTRAVENOUS

## 2018-10-23 MED ORDER — IOHEXOL 300 MG/ML  SOLN
150.0000 mL | Freq: Once | INTRAMUSCULAR | Status: AC | PRN
  Administered 2018-10-23: 14:00:00 75 mL via INTRA_ARTERIAL

## 2018-10-23 MED ORDER — ASPIRIN 81 MG PO CHEW
324.0000 mg | CHEWABLE_TABLET | Freq: Every day | ORAL | Status: DC
  Filled 2018-10-23: qty 4

## 2018-10-23 NOTE — Procedures (Signed)
S/P Staged embolization of large fast flow Lt ICA [facility]  fistula  with coiling and stenting.

## 2018-10-23 NOTE — Progress Notes (Signed)
Pts sister Rayetta Pigg called, no answer. Will try again. Number correct as confirmed by pt.

## 2018-10-23 NOTE — Progress Notes (Signed)
Evaluated patient in PACU alongside Dr. Corliss Skains following procedure. Patient with history of large left ICA cavernous fistula s/p cerebral arteriogram with staged embolization of large left ICA cavernous fistula with coiling and stenting 10/23/2018 by Dr. Corliss Skains.  Patient awake and alert laying in bed. Complains of lightheadedness and feeling "hot".  Alert, awake, and oriented x3. Speech and comprehension intact. Pupils sluggish, right pupil 2 mm and left pupil 3-4 mm. EOMs intact bilaterally without nystagmus or subjective diplopia. No facial asymmetry. Tongue midline. Motor power symmetric proportional to effort. No pronator drift. Visual fields, fine motor/coordination, gait, romberg, and heel to toe not assessed. Distal pulses palpable bilaterally with Doppler. Right groin incision soft without active bleeding or hematoma.  Plan to transfer to neuro ICU for overnight observation. Right groin stable. Discontinue Plavix, continue taking Aspirin 325 mg once daily. Additional plans per [facility], Dr. Ella Jubilee aware- appreciate and agree with management. NIR to follow.  Waylan Boga Arrington Yohe, PA-C 10/23/2018, 3:40 PM

## 2018-10-23 NOTE — Sedation Documentation (Signed)
Patient is under the care of anesthesia  

## 2018-10-23 NOTE — Anesthesia Procedure Notes (Addendum)
Arterial Line Insertion Start/End5/10/2018 9:00 AM, 10/23/2018 9:04 AM Performed by: Kaylyn Layer, MD, anesthesiologist  Patient location: Pre-op. Preanesthetic checklist: patient identified, IV checked, site marked, risks and benefits discussed, surgical consent, monitors and equipment checked, pre-op evaluation, timeout performed and anesthesia consent Lidocaine 1% used for infiltration Right, brachial was placed Catheter size: 20 Fr Hand hygiene performed  and maximum sterile barriers used   Attempts: 4 Procedure performed using ultrasound guided technique. Ultrasound Notes:anatomy identified, needle tip was noted to be adjacent to the nerve/plexus identified, no ultrasound evidence of intravascular and/or intraneural injection and image(s) printed for medical record Following insertion, dressing applied and Biopatch. Post procedure assessment: normal and unchanged  Patient tolerated the procedure well with no immediate complications. Additional procedure comments: Multiple attempt by CRNA and myself on left arm, radial and brachial, with very small vessels observed. Multiple vessel punctures with good flow, unable to thread catheter. Right radial attempt x1 by Dr. Maple Hudson unsuccessful prior to successful access of right brachial artery. Stephannie Peters, MD.

## 2018-10-23 NOTE — Progress Notes (Addendum)
ANTICOAGULATION CONSULT NOTE - Initial Consult  Pharmacy Consult for heparin Indication: post-neuro procedure  Allergies  Allergen Reactions  . Guaifenesin Shortness Of Breath  . Iron Anaphylaxis and Other (See Comments)    IV only  . Doxepin Hives  . Celexa [Citalopram Hydrobromide]     HAs  . Codeine Other (See Comments)    tiredness  . Hydrocodone-Acetaminophen     UNSPECIFIED REACTION   . Metoprolol Succinate Swelling    UNSPECIFIED EDEMA  . Pseudoeph-Doxylamine-Dm-Apap Hives  . Pseudoephedrine Hives  . Sulfa Antibiotics Hives  . Hydromorphone Hcl Nausea And Vomiting  . Morphine Other (See Comments)    Does not work; hallucinations and irritable  . Pantoprazole Sodium Other (See Comments)    heartburn  . Prednisone Rash  . Tetracyclines & Related Rash    Patient Measurements: Height: 5\' 7"  (170.2 cm) Weight: 104 lb 0.9 oz (47.2 kg) IBW/kg (Calculated) : 61.6 Heparin Dosing Weight: 47kg  Vital Signs: Temp: 97.9 F (36.6 C) (05/05 1430) Temp Source: Oral (05/05 0312) BP: 116/69 (05/05 1445) Pulse Rate: 52 (05/05 1445)  Labs: Recent Labs    10/21/18 0401 10/22/18 0514 10/22/18 0923 10/23/18 0613  HGB 12.2  --   --  13.2  HCT 35.7*  --   --  38.6  PLT 271  --   --  291  LABPROT  --   --  13.3  --   INR  --   --  1.0  --   CREATININE 0.71 0.86  --  0.76    Estimated Creatinine Clearance: 64.1 mL/min (by C-G formula based on SCr of 0.76 mg/dL).   Medical History: Past Medical History:  Diagnosis Date  . Allergic rhinitis   . Anxiety   . B12 deficiency   . Barrett's esophagus   . Chronic constipation   . 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 Depression   . GERD (gastroesophageal reflux disease)   . Hiatal hernia   . History of DVT of lower extremity    08-14-2003  RIGHT LOWER EXTREMITY-- TREATED W/ COUMADIN----  per pt none since  . History of palpitations   . Hypertension   . Idiopathic chronic gout    12-20-2017  per pt stable ,   last episode 02/ 2019  . Iron deficiency anemia   . Migraines   . Personal history of PE (pulmonary embolism)    03/ 2005 RIGHT SIDE  --- treated w/ coumadin---  per pt none since  . PONV (postoperative nausea and vomiting)   . Tinea versicolor   . Vitamin D deficiency     Assessment: 27 yoF with PMH PE and DVT admitted for L ICA embolization to start on low-dose heparin post-neuroradiology procedure. Heparin ordered to begin at 500 units/hr with target aPTT 50-60 seconds (heparin level 0.1-0.25).  Goal of Therapy:  Heparin level 0.1-0.25 units/ml Monitor platelets by anticoagulation protocol: Yes   Plan:  -Heparin 500 units/hr for now -Check 6hr heparin level -Stop heparin at 0800 5/6 for sheath removal   ADDENDUM: Initial heparin level above goal at 0.35, will reduce to 400 units/hr.   Fredonia Highland, PharmD, BCPS Clinical Pharmacist [phone] Please check AMION for all [facility] Pharmacy numbers 10/23/2018

## 2018-10-23 NOTE — Progress Notes (Signed)
Pt admitted to 4 No 29 in stable condition.

## 2018-10-23 NOTE — Progress Notes (Signed)
PROGRESS NOTE    [name]  PQD:[id number] DOB: 10-Sep-1970 DOA: 10/19/2018 PCP: Tresa Garter, MD    Brief Narrative:  48 year old female who presented with nausea, vomiting,andleft eye pain.She does have significant past medical history for Barrett's esophagus and hypertension. Was recently diagnosed with a 9 mm right ICA aneurysm which repair was on hold due to current COVID pandemic.Her initial physical examination blood pressure 138/96, heart rate 89, respirate 16, temperature 98, oxygen saturation 98%. She had mild proptosis of the left eye, subconjunctival hematoma and positive discomfort with eye movement. Moist mucous membranes, lungs clear to auscultation bilaterally, heart S1-S2 present rhythmic, abdomen soft, no lower extremity edema. CT angiography of the head showed abnormal fat stranding involving the intraconal fat of the left orbit with associated extraocular muscle enlargement and engorgement of the left superior orbital vein, findings concerning for possible cavernous carotid fistula or arteriovenous shunt about the cavernous sinus.Chest film with no infiltrates, left base pleural thickening, costophrenic angle.  Patient was admitted to the hospital with a working diagnosis ofleftcarotid cavernous fistula   Assessment & Plan:   Principal Problem:   Carotid-cavernous fistula Active Problems:   Essential hypertension   Hypokalemia   Hypercalcemia   Aneurysm of right internal carotid artery   Acquired left carotid-cavernous fistula   1. Large fast flow Left internal carotid artery-cavernous fistula with primary drainage into the Lt cavernous sinus.Patient had cerebral angiogram with embolization to the left ICA cavernous fistula today, plan to continue asa 325 mg, discontinue clopidogrel, follow in 2 to 3 weeks as outpatient to repeat angiogram. Possible dc in am, continue with IR recommendations.   2. HTN. HCTZ for blood pressure control with  good toleration.   3. Persistent Hypokalemia with hypomagnesemia with improved hypercalcemia.Her K has corrected to 3,7, preserved renal function. Continue Hctz for blood pressure control, Will need outpatient follow up on electrolytes.   4. Dyspepsia. Clinically improved, continue antiacid therapy and as needed antiemetics.  5. Anxiety.On diazepam for anxiety.  DVT prophylaxis:scd Code Status:full Family Communication:no family at the bedside Disposition Plan/ discharge barriers:possible dc in am per IR recommendations.   Body mass index is 16.3 kg/m. Malnutrition Type:      Malnutrition Characteristics:      Nutrition Interventions:     RN Pressure Injury Documentation:     Consultants:   IR  Procedures:   Cerebral angiogram 05/02  Cerebral angiogram 05/05 with embolization.   Antimicrobials:       Subjective: Patient is feeling sedated post procedure, no frank eye pain, no chest pain or dyspnea.   Objective: Vitals:   10/23/18 1611 10/23/18 1626 10/23/18 1700 10/23/18 1715  BP: 119/70 108/70 109/73   Pulse:   (!) 56 (!) 56  Resp: 15 16 (!) 9 19  Temp:  97.9 F (36.6 C)    TempSrc:      SpO2:  100% 100% 100%  Weight:      Height:        Intake/Output Summary (Last 24 hours) at 10/23/2018 1735 Last data filed at 10/23/2018 1429 Gross per 24 hour  Intake 1400 ml  Output 525 ml  Net 875 ml   Filed Weights   10/19/18 2206 10/20/18 0642  Weight: 50.3 kg 47.2 kg    Examination:   General: Not in pain or dyspnea Neurology: Awake and alert, non focal  E ENT: no pallor, no icterus, oral mucosa moist/ left eye ecchymosis and peri-orbital edema.  Cardiovascular: No JVD. S1-S2 present, rhythmic,  no gallops, rubs, or murmurs. No lower extremity edema. Pulmonary: positive breath sounds bilaterally, adequate air movement, no wheezing, rhonchi or rales. Gastrointestinal. Abdomen with no organomegaly, non tender, no rebound or guarding  Skin. No rashes Musculoskeletal: no joint deformities     Data Reviewed: I have personally reviewed following labs and imaging studies  CBC: Recent Labs  Lab 10/19/18 1837 10/21/18 0401 10/23/18 0613  WBC 11.9* 14.9* 12.8*  NEUTROABS  --  9.8* 8.0*  HGB 16.5* 12.2 13.2  HCT 48.3* 35.7* 38.6  MCV 86.6 87.3 87.9  PLT 350 271 291   Basic Metabolic Panel: Recent Labs  Lab 10/19/18 1837 10/20/18 0540 10/20/18 1541 10/21/18 0401 10/22/18 0514 10/23/18 0613  NA 140  --  138 139 141 140  K 3.0*  --  3.5 2.6* 3.0* 3.7  CL 100  --  105 103 107 107  CO2 22  --  17* 22 24 22   GLUCOSE 123*  --  108* 92 89 94  BUN 11  --  13 12 11 13   CREATININE 0.93  --  0.80 0.71 0.86 0.76  CALCIUM 12.7* 11.2* 9.4 9.2 9.0 9.3  MG  --   --   --   --  1.7 2.2   GFR: Estimated Creatinine Clearance: 64.1 mL/min (by C-G formula based on SCr of 0.76 mg/dL). Liver Function Tests: Recent Labs  Lab 10/19/18 1837  AST 25  ALT 12  ALKPHOS 57  BILITOT 1.4*  PROT 8.6*  ALBUMIN 5.2*   Recent Labs  Lab 10/19/18 1837  LIPASE 23   No results for input(s): AMMONIA in the last 168 hours. Coagulation Profile: Recent Labs  Lab 10/22/18 0923  INR 1.0   Cardiac Enzymes: No results for input(s): CKTOTAL, CKMB, CKMBINDEX, TROPONINI in the last 168 hours. BNP (last 3 results) No results for input(s): PROBNP in the last 8760 hours. HbA1C: No results for input(s): HGBA1C in the last 72 hours. CBG: Recent Labs  Lab 10/23/18 1453  GLUCAP 151*   Lipid Profile: No results for input(s): CHOL, HDL, LDLCALC, TRIG, CHOLHDL, LDLDIRECT in the last 72 hours. Thyroid Function Tests: No results for input(s): TSH, T4TOTAL, FREET4, T3FREE, THYROIDAB in the last 72 hours. Anemia Panel: No results for input(s): VITAMINB12, FOLATE, FERRITIN, TIBC, IRON, RETICCTPCT in the last 72 hours.    Radiology Studies: I have reviewed all of the imaging during this hospital visit personally     Scheduled Meds:  . [START ON 10/24/2018] aspirin  325 mg Oral Daily   Or  . [START ON 10/24/2018] aspirin  324 mg Per Tube Daily  . clopidogrel  75 mg Oral Daily  . hydrochlorothiazide  12.5 mg Oral Daily  . nitroGLYCERIN      . promethazine       Continuous Infusions: . sodium chloride    . clevidipine    . heparin    . heparin 500 Units/hr (10/23/18 1515)     LOS: 3 days        Kandie Keiper Annett Gulaaniel Tunisha Ruland, MD

## 2018-10-23 NOTE — Anesthesia Postprocedure Evaluation (Signed)
Anesthesia Post Note  Patient: [name]  Procedure(s) Performed: IR WITH ANESTHESIA FOR EMBOLIZATION (N/A )     Patient location during evaluation: PACU Anesthesia Type: General Level of consciousness: sedated and patient cooperative Pain management: pain level controlled Vital Signs Assessment: post-procedure vital signs reviewed and stable Respiratory status: spontaneous breathing Cardiovascular status: stable Anesthetic complications: no    Last Vitals:  Vitals:   10/23/18 1700 10/23/18 1715  BP: 109/73   Pulse: (!) 56 (!) 56  Resp: (!) 9 19  Temp:    SpO2: 100% 100%    Last Pain:  Vitals:   10/23/18 1700  TempSrc:   PainSc: 0-No pain                 Lewie Loron

## 2018-10-23 NOTE — Anesthesia Procedure Notes (Signed)
Procedure Name: Intubation Performed by: Lizandro Spellman H, CRNA Pre-anesthesia Checklist: Patient identified, Emergency Drugs available, Suction available and Patient being monitored Patient Re-evaluated:Patient Re-evaluated prior to induction Oxygen Delivery Method: Circle System Utilized Preoxygenation: Pre-oxygenation with 100% oxygen Induction Type: IV induction Laryngoscope Size: Mac and 3 Grade View: Grade I Tube type: Oral Tube size: 7.0 mm Number of attempts: 1 Airway Equipment and Method: Stylet and Oral airway Placement Confirmation: ETT inserted through vocal cords under direct vision,  positive ETCO2 and breath sounds checked- equal and bilateral Secured at: 23 cm Tube secured with: Tape Dental Injury: Teeth and Oropharynx as per pre-operative assessment        

## 2018-10-23 NOTE — Transfer of Care (Signed)
Immediate Anesthesia Transfer of Care Note  Patient: [name]  Procedure(s) Performed: IR WITH ANESTHESIA FOR EMBOLIZATION (N/A )  Patient Location: PACU  Anesthesia Type:General  Level of Consciousness: drowsy  Airway & Oxygen Therapy: Patient Spontanous Breathing and Patient connected to nasal cannula oxygen  Post-op Assessment: Report given to RN and Post -op Vital signs reviewed and stable  Post vital signs: Reviewed and stable  Last Vitals:  Vitals Value Taken Time  BP 139/73 10/23/2018  2:26 PM  Temp    Pulse 65 10/23/2018  2:29 PM  Resp 16 10/23/2018  2:29 PM  SpO2 99 % 10/23/2018  2:29 PM  Vitals shown include unvalidated device data.  Last Pain:  Vitals:   10/23/18 0312  TempSrc: Oral  PainSc:          Complications: No apparent anesthesia complications

## 2018-10-23 NOTE — Progress Notes (Signed)
Patient ID: [name], female   DOB: [birth year], 48 y.o.   MRN: [id number] INR  Post procedure. Extubated . Maintaining O2  Sats. Pupils 90mm RT = LT sluggish . Follows simple commands appropriatelt Moves all 4s. RT groin soft with bruising releted to pearlier procedure. S.Mandalyn Pasqua MD

## 2018-10-24 ENCOUNTER — Encounter (HOSPITAL_COMMUNITY): Payer: Self-pay | Admitting: Interventional Radiology

## 2018-10-24 LAB — CBC WITH DIFFERENTIAL/PLATELET
Abs Immature Granulocytes: 0.07 10*3/uL (ref 0.00–0.07)
Basophils Absolute: 0 10*3/uL (ref 0.0–0.1)
Basophils Relative: 0 %
Eosinophils Absolute: 0 10*3/uL (ref 0.0–0.5)
Eosinophils Relative: 0 %
HCT: 28.3 % — ABNORMAL LOW (ref 36.0–46.0)
Hemoglobin: 9.4 g/dL — ABNORMAL LOW (ref 12.0–15.0)
Immature Granulocytes: 1 %
Lymphocytes Relative: 18 %
Lymphs Abs: 2.3 10*3/uL (ref 0.7–4.0)
MCH: 30.1 pg (ref 26.0–34.0)
MCHC: 33.2 g/dL (ref 30.0–36.0)
MCV: 90.7 fL (ref 80.0–100.0)
Monocytes Absolute: 1 10*3/uL (ref 0.1–1.0)
Monocytes Relative: 8 %
Neutro Abs: 9.1 10*3/uL — ABNORMAL HIGH (ref 1.7–7.7)
Neutrophils Relative %: 73 %
Platelets: 219 10*3/uL (ref 150–400)
RBC: 3.12 MIL/uL — ABNORMAL LOW (ref 3.87–5.11)
RDW: 13.5 % (ref 11.5–15.5)
WBC: 12.5 10*3/uL — ABNORMAL HIGH (ref 4.0–10.5)
nRBC: 0.2 % (ref 0.0–0.2)

## 2018-10-24 LAB — BASIC METABOLIC PANEL
Anion gap: 14 (ref 5–15)
BUN: 13 mg/dL (ref 6–20)
CO2: 17 mmol/L — ABNORMAL LOW (ref 22–32)
Calcium: 8 mg/dL — ABNORMAL LOW (ref 8.9–10.3)
Chloride: 109 mmol/L (ref 98–111)
Creatinine, Ser: 0.76 mg/dL (ref 0.44–1.00)
GFR calc Af Amer: >60 mL/min (ref >60)
GFR calc non Af Amer: >60 mL/min (ref >60)
Glucose, Bld: 106 mg/dL — ABNORMAL HIGH (ref 70–99)
Potassium: 3.3 mmol/L — ABNORMAL LOW (ref 3.5–5.1)
Sodium: 140 mmol/L (ref 135–145)

## 2018-10-24 LAB — HEMOGLOBIN AND HEMATOCRIT, BLOOD
HCT: 28.3 % — ABNORMAL LOW (ref 36.0–46.0)
Hemoglobin: 9.4 g/dL — ABNORMAL LOW (ref 12.0–15.0)

## 2018-10-24 LAB — HEPARIN LEVEL (UNFRACTIONATED): Heparin Unfractionated: <0.1 IU/mL — ABNORMAL LOW (ref 0.30–0.70)

## 2018-10-24 NOTE — Progress Notes (Signed)
ANTICOAGULATION CONSULT NOTE - Follow Up Consult  Pharmacy Consult for heparin Indication: s/p neuro procedure  Labs: Recent Labs    10/22/18 0514 10/22/18 0923 10/23/18 0613 10/23/18 2130 10/24/18 0425  HGB  --   --  13.2  --   --   HCT  --   --  38.6  --   --   PLT  --   --  291  --   --   LABPROT  --  13.3  --   --   --   INR  --  1.0  --   --   --   HEPARINUNFRC  --   --   --  0.34 <0.10*  CREATININE 0.86  --  0.76  --  0.76    Assessment: 48yo female now subtherapeutic on heparin after rate change; no gtt issues or signs of bleeding per RN.  Goal of Therapy:  Heparin level 0.1-0.25 units/ml   Plan:  Will increase heparin gtt slightly to 450 units/hr until off at 0800.    Vernard Gambles, PharmD, BCPS  10/24/2018,5:48 AM

## 2018-10-24 NOTE — Progress Notes (Signed)
Referring Physician(s): Hillary Bow  Supervising Physician: Julieanne Cotton  Patient Status:  [facility] - In-pt  Chief Complaint: "Tired; film over left eye"  Subjective:  Left ICA cavernous fistula s/p staged embolization using coils and stenting 10/23/2018 by Dr. Corliss Skains. Patient awake and alert laying in bed. Complains of feeling tired. Complains of "film" over left eye.   Allergies: Guaifenesin; Iron; Doxepin; Celexa [citalopram hydrobromide]; Codeine; Hydrocodone-acetaminophen; Metoprolol succinate; Pseudoeph-doxylamine-dm-apap; Pseudoephedrine; Sulfa antibiotics; Hydromorphone hcl; Morphine; Pantoprazole sodium; Prednisone; and Tetracyclines & related  Medications: Prior to Admission medications   Medication Sig Start Date End Date Taking? Authorizing Provider  allopurinol (ZYLOPRIM) 100 MG tablet Take 1 tablet (100 mg total) by mouth daily. Must keep visit on 11/03/17 to get refills Patient taking differently: Take 100 mg by mouth daily as needed (for gout flare up). Must keep visit on 11/03/17 to get refills 11/03/17  Yes Plotnikov, Georgina Quint, MD  aspirin-acetaminophen-caffeine (EXCEDRIN MIGRAINE) [phone] MG tablet Take 1 tablet by mouth every 6 (six) hours as needed for headache or migraine.    Yes [provider]  cetirizine (ZYRTEC) 10 MG tablet Take 10 mg by mouth daily as needed for allergies.   Yes [provider]  Cholecalciferol (VITAMIN D3) 2000 units TABS Take 2,000 Units by mouth daily.    Yes [provider]  diazepam (VALIUM) 5 MG tablet TAKE 1 TABLET BY MOUTH EVERY 12 HOURS IF NEEDED FOR ANXIETY Patient taking differently: Take 5 mg by mouth every 12 (twelve) hours as needed for anxiety. take 1 tablet by mouth every 12 hours if needed for anxiety 05/16/18  Yes Plotnikov, Georgina Quint, MD  ferrous gluconate (IRON 27) 240 (27 FE) MG tablet Take 240 mg by mouth daily.    Yes [provider]  hydrochlorothiazide (HYDRODIURIL)  25 MG tablet Take 25 mg by mouth daily.    Yes [provider]  Multiple Vitamins-Minerals (MULTIVITAMIN ADULTS PO) Take 1 tablet by mouth daily.    Yes [provider]  potassium chloride SA (K-DUR,KLOR-CON) 20 MEQ tablet Take 2 tablets (40 mEq total) by mouth every morning. Patient taking differently: Take 20 mEq by mouth daily.  06/16/18  Yes Hongalgi, Maximino Greenland, MD  promethazine (PHENERGAN) 12.5 MG tablet Take 1 tablet (12.5 mg total) by mouth every 8 (eight) hours as needed for nausea or vomiting. 10/19/18  Yes Myrlene Broker, MD  metroNIDAZOLE (FLAGYL) 500 MG tablet Take 1 tablet (500 mg total) by mouth 2 (two) times daily. Patient not taking: Reported on 07/11/2018 06/21/18   Plotnikov, Georgina Quint, MD  oseltamivir (TAMIFLU) 75 MG capsule Take 1 capsule (75 mg total) by mouth 2 (two) times daily. Patient not taking: Reported on 10/20/2018 08/17/18   Myrlene Broker, MD     Vital Signs: BP (!) 92/53 (BP Location: Left Arm)   Pulse 66   Temp 98.3 F (36.8 C) (Oral)   Resp (!) 21   Ht 5\' 7"  (1.702 m)   Wt 104 lb 0.9 oz (47.2 kg)   SpO2 97%   BMI 16.30 kg/m   Physical Exam Vitals signs and nursing note reviewed.  Constitutional:      General: She is not in acute distress.    Appearance: Normal appearance.  Eyes:     Comments: Left eye with worsening erythema and ecchymosis compared to yesterday.  Pulmonary:     Effort: Pulmonary effort is normal. No respiratory distress.  Skin:    General: Skin is warm and dry.  Comments: Right groin incision soft without active bleeding or hematoma.  Neurological:     Mental Status: She is alert.     Comments: Alert, awake, and oriented x3. Speech and comprehension intact. Left pupil 4 mm and sluggish to react, right pupil 2mm and brisk to react. Decreased abduction of left eye on left lateral gaze with subjective diplopia. Visual fields intact bilaterally. No facial asymmetry. Tongue midline. Motor power  symmetric proportional to effort. No pronator drift. Fine motor and coordination, gait, romberg, heel to toe not assessed. Distal pulses 1+ bilaterally.  Psychiatric:        Mood and Affect: Mood normal.        Behavior: Behavior normal.        Thought Content: Thought content normal.        Judgment: Judgment normal.     Imaging: No results found.  Labs:  CBC: Recent Labs    10/19/18 1837 10/21/18 0401 10/23/18 0613 10/24/18 0425  WBC 11.9* 14.9* 12.8* 12.5*  HGB 16.5* 12.2 13.2 9.4*  HCT 48.3* 35.7* 38.6 28.3*  PLT 350 271 291 219    COAGS: Recent Labs    10/22/18 0923  INR 1.0    BMP: Recent Labs    10/21/18 0401 10/22/18 0514 10/23/18 0613 10/24/18 0425  NA 139 141 140 140  K 2.6* 3.0* 3.7 3.3*  CL 103 107 107 109  CO2 22 24 22  17*  GLUCOSE 92 89 94 106*  BUN 12 11 13 13   CALCIUM 9.2 9.0 9.3 8.0*  CREATININE 0.71 0.86 0.76 0.76  GFRNONAA >60 >60 >60 >60  GFRAA >60 >60 >60 >60    LIVER FUNCTION TESTS: Recent Labs    11/28/17 0434 11/29/17 0446 07/11/18 1300 10/19/18 1837  BILITOT 0.9 0.7 1.5* 1.4*  AST 24 20 45* 25  ALT 18 17 13 12   ALKPHOS 55 46 49 57  PROT 7.5 6.5 7.6 8.6*  ALBUMIN 4.2 3.7 4.3 5.2*    Assessment and Plan:  Left ICA cavernous fistula s/p staged embolization using coils and stenting 10/23/2018 by Dr. [name]. On PE, appears that patient's left eye appears to be worse (worsening erythema/eccymosis, subjective diplopia on lateral gaze, pupil size 4 mm)- plan to stay another night and move up staged embolization to either Friday or Monday (pending scheduling). Continue taking Aspirin 325 mg once daily. Appreciate and [facility] with [facility]. NIR to follow.   Electronically Signed: Elwin MochaAlexandra Clayburn Weekly, PA-C 10/24/2018, 9:25 AM   I spent a total of 35 Minutes at the the patient's bedside AND on the patient's hospital floor or unit, greater than 50% of which was counseling/coordinating care for left ICA cavernous fistula  s/p embolization.

## 2018-10-24 NOTE — Progress Notes (Signed)
PROGRESS NOTE    [name]  FAO:[id number] DOB: 12/05/1970 DOA: 10/19/2018 PCP: Tresa GarterPlotnikov, Aleksei V, MD   Brief Narrative:  48 year old female who presented with nausea, vomiting,andleft eye pain.She does have significant past medical history for Barrett's esophagus and hypertension. Was recently diagnosed with a 9 mm right ICA aneurysm which repair was on hold due to current COVID pandemic.Her initial physical examination blood pressure 138/96, heart rate 89, respirate 16, temperature 98, oxygen saturation 98%. She had mild proptosis of the left eye, subconjunctival hematoma and positive discomfort with eye movement. Moist mucous membranes, lungs clear to auscultation bilaterally, heart S1-S2 present rhythmic, abdomen soft, no lower extremity edema. CT angiography of the head showed abnormal fat stranding involving the intraconal fat of the left orbit with associated extraocular muscle enlargement and engorgement of the left superior orbital vein, findings concerning for possible cavernous carotid fistula or arteriovenous shunt about the cavernous sinus.Chest film with no infiltrates, left base pleural thickening, costophrenic angle.  Patient was admitted to the hospital with a working diagnosis ofleftcarotid cavernous fistula Assessment & Plan:   Principal Problem:   Carotid-cavernous fistula Active Problems:   Essential hypertension   Hypokalemia   Hypercalcemia   Aneurysm of right internal carotid artery   Acquired left carotid-cavernous fistula    Left ICA cavernous fistula s/p staged embolization using coils and stenting 10/23/2018 by Dr. [name]. Further recommendations as per IR.    Hypertension:  Well controlled.    Hypokalemia and hypomagnesemia:  Replaced.    GERD: Improved.    Anxiety:  On diazepam.    DVT prophylaxis: scd's Code Status: full code.  Family Communication: none at bedside.  Disposition Plan: pending clinical improvement.     Consultants:   IR   Procedures:  Left ICA cavernous fistula s/p staged embolization using coils and stenting 10/23/2018 by Dr. [name].   Antimicrobials: none.    Subjective: Mild pain in the left eye.   Objective: Vitals:   10/24/18 1400 10/24/18 1500 10/24/18 1600 10/24/18 1700  BP: 109/69 108/60 117/68 99/61  Pulse: 65 67 63 68  Resp: (!) 21 20 (!) 22 (!) 6  Temp:      TempSrc:      SpO2: 93% 95% 95% 95%  Weight:      Height:        Intake/Output Summary (Last 24 hours) at 10/24/2018 1808 Last data filed at 10/24/2018 1700 Gross per 24 hour  Intake 1842.39 ml  Output 360 ml  Net 1482.39 ml   Filed Weights   10/19/18 2206 10/20/18 0642  Weight: 50.3 kg 47.2 kg    Examination:  General exam: left eye ecchyomosis , mild distress from pain in the left eye.  Respiratory system: Clear to auscultation. Respiratory effort normal. Cardiovascular system: S1 & S2 heard, RRR. No JVD,  No pedal edema. Gastrointestinal system: Abdomen is nondistended, soft and nontender. . Normal bowel sounds heard. Central nervous system: Alert and oriented. No focal neurological deficits. Extremities: Symmetric 5 x 5 power. Skin: No rashes, lesions or ulcers Psychiatry: . Mood & affect appropriate.     Data Reviewed: I have personally reviewed following labs and imaging studies  CBC: Recent Labs  Lab 10/19/18 1837 10/21/18 0401 10/23/18 0613 10/24/18 0425 10/24/18 1230  WBC 11.9* 14.9* 12.8* 12.5*  --   NEUTROABS  --  9.8* 8.0* 9.1*  --   HGB 16.5* 12.2 13.2 9.4* 9.4*  HCT 48.3* 35.7* 38.6 28.3* 28.3*  MCV 86.6 87.3 87.9 90.7  --  PLT 350 271 291 219  --    Basic Metabolic Panel: Recent Labs  Lab 10/20/18 1541 10/21/18 0401 10/22/18 0514 10/23/18 0613 10/24/18 0425  NA 138 139 141 140 140  K 3.5 2.6* 3.0* 3.7 3.3*  CL 105 103 107 107 109  CO2 17* 17*  GLUCOSE 108* 92 89 94 106*  BUN CREATININE 0.80 0.71 0.86 0.76 0.76  CALCIUM 9.4 9.2  9.0 9.3 8.0*  MG  --   --  1.7 2.2  --    GFR: Estimated Creatinine Clearance: 64.1 mL/min (by C-G formula based on SCr of 0.76 mg/dL). Liver Function Tests: Recent Labs  Lab 10/19/18 1837  AST 25  ALT 12  ALKPHOS 57  BILITOT 1.4*  PROT 8.6*  ALBUMIN 5.2*   Recent Labs  Lab 10/19/18 1837  LIPASE 23   No results for input(s): AMMONIA in the last 168 hours. Coagulation Profile: Recent Labs  Lab 10/22/18 0923  INR 1.0   Cardiac Enzymes: No results for input(s): CKTOTAL, CKMB, CKMBINDEX, TROPONINI in the last 168 hours. BNP (last 3 results) No results for input(s): PROBNP in the last 8760 hours. HbA1C: No results for input(s): HGBA1C in the last 72 hours. CBG: Recent Labs  Lab 10/23/18 1453  GLUCAP 151*   Lipid Profile: No results for input(s): CHOL, HDL, LDLCALC, TRIG, CHOLHDL, LDLDIRECT in the last 72 hours. Thyroid Function Tests: No results for input(s): TSH, T4TOTAL, FREET4, T3FREE, THYROIDAB in the last 72 hours. Anemia Panel: No results for input(s): VITAMINB12, FOLATE, FERRITIN, TIBC, IRON, RETICCTPCT in the last 72 hours. Sepsis Labs: No results for input(s): PROCALCITON, LATICACIDVEN in the last 168 hours.  Recent Results (from the past 240 hour(s))  Novel Coronavirus, NAA (hospital order; send-out to ref lab)     Status: None   Collection Time: 10/22/18  2:57 PM  Result Value Ref Range Status   SARS-CoV-2, NAA NOT DETECTED NOT DETECTED Final    Comment: (NOTE) This test was developed and its performance characteristics determined by [employer]. This test has not been FDA cleared or approved. This test has been authorized by FDA under an Emergency Use Authorization (EUA). This test is only authorized for the duration of time the declaration that circumstances exist justifying the authorization of the emergency use of in vitro diagnostic tests for detection of SARS-CoV-2 virus and/or diagnosis of COVID-19 infection under section 564(b)(1)  of the Act, 21 U.S.C. 161WRU-0(A)(5), unless the authorization is terminated or revoked sooner. When diagnostic testing is negative, the possibility of a false negative result should be considered in the context of a patient's recent exposures and the presence of clinical signs and symptoms consistent with COVID-19. An individual without symptoms of COVID-19 and who is not shedding SARS-CoV-2 virus would expect to have a negative (not detected) result in this assay. Performed  At: [facility] [address] [place], [place] [id number] Jolene Schimke MD NW:[id number]    Coronavirus Source NASOPHARYNGEAL  Final    Comment: Performed at [facility], [address], [place], [place] [address]  Surgical pcr screen     Status: None   Collection Time: 10/23/18  6:16 AM  Result Value Ref Range Status   MRSA, PCR NEGATIVE NEGATIVE Final   Staphylococcus aureus NEGATIVE NEGATIVE Final    Comment: (NOTE) The Xpert SA Assay (FDA approved for NASAL specimens in patients 40 years of age and older), is one component of a comprehensive  surveillance program. It is not intended to diagnose infection nor to guide or monitor treatment. Performed at [facility], [address] [place], [place] [address]          Radiology Studies: No results found.      Scheduled Meds: . aspirin  325 mg Oral Daily   Or  . aspirin  324 mg Per Tube Daily  . hydrochlorothiazide  12.5 mg Oral Daily   Continuous Infusions: . sodium chloride 75 mL/hr at 10/24/18 1229     LOS: 4 days    Time spent: 29 minutes.     Kathlen Mody, MD [facility] Pager [phone]  If 7PM-7AM, please contact night-coverage [email or web address] Password [email or web address] 10/24/2018, 6:08 PM

## 2018-10-25 ENCOUNTER — Inpatient Hospital Stay (HOSPITAL_COMMUNITY): Payer: 59 | Admitting: Registered Nurse

## 2018-10-25 ENCOUNTER — Other Ambulatory Visit: Payer: Self-pay | Admitting: Student

## 2018-10-25 ENCOUNTER — Encounter (HOSPITAL_COMMUNITY): Payer: Self-pay | Admitting: Interventional Radiology

## 2018-10-25 LAB — CBC WITH DIFFERENTIAL/PLATELET
Abs Immature Granulocytes: 0.07 10*3/uL (ref 0.00–0.07)
Basophils Absolute: 0 10*3/uL (ref 0.0–0.1)
Basophils Relative: 0 %
Eosinophils Absolute: 0 10*3/uL (ref 0.0–0.5)
Eosinophils Relative: 0 %
HCT: 32.2 % — ABNORMAL LOW (ref 36.0–46.0)
Hemoglobin: 10.8 g/dL — ABNORMAL LOW (ref 12.0–15.0)
Immature Granulocytes: 0 %
Lymphocytes Relative: 13 %
Lymphs Abs: 2.3 10*3/uL (ref 0.7–4.0)
MCH: 30.2 pg (ref 26.0–34.0)
MCHC: 33.5 g/dL (ref 30.0–36.0)
MCV: 89.9 fL (ref 80.0–100.0)
Monocytes Absolute: 1.1 10*3/uL — ABNORMAL HIGH (ref 0.1–1.0)
Monocytes Relative: 6 %
Neutro Abs: 14.5 10*3/uL — ABNORMAL HIGH (ref 1.7–7.7)
Neutrophils Relative %: 81 %
Platelets: 222 10*3/uL (ref 150–400)
RBC: 3.58 MIL/uL — ABNORMAL LOW (ref 3.87–5.11)
RDW: 13.1 % (ref 11.5–15.5)
WBC: 18 10*3/uL — ABNORMAL HIGH (ref 4.0–10.5)
nRBC: 0 % (ref 0.0–0.2)

## 2018-10-25 LAB — IRON AND TIBC
Iron: 34 ug/dL (ref 28–170)
Saturation Ratios: 11 % (ref 10.4–31.8)
TIBC: 297 ug/dL (ref 250–450)
UIBC: 263 ug/dL

## 2018-10-25 LAB — BASIC METABOLIC PANEL
Anion gap: 10 (ref 5–15)
BUN: 9 mg/dL (ref 6–20)
CO2: 23 mmol/L (ref 22–32)
Calcium: 8.1 mg/dL — ABNORMAL LOW (ref 8.9–10.3)
Chloride: 107 mmol/L (ref 98–111)
Creatinine, Ser: 0.8 mg/dL (ref 0.44–1.00)
GFR calc Af Amer: >60 mL/min (ref >60)
GFR calc non Af Amer: >60 mL/min (ref >60)
Glucose, Bld: 106 mg/dL — ABNORMAL HIGH (ref 70–99)
Potassium: 2.9 mmol/L — ABNORMAL LOW (ref 3.5–5.1)
Sodium: 140 mmol/L (ref 135–145)

## 2018-10-25 LAB — URINALYSIS, COMPLETE (UACMP) WITH MICROSCOPIC
Bacteria, UA: NONE SEEN
Bilirubin Urine: NEGATIVE
Glucose, UA: NEGATIVE mg/dL
Ketones, ur: 5 mg/dL — AB
Leukocytes,Ua: NEGATIVE
Nitrite: NEGATIVE
Protein, ur: NEGATIVE mg/dL
Specific Gravity, Urine: 1.004 — ABNORMAL LOW (ref 1.005–1.030)
pH: 7 (ref 5.0–8.0)

## 2018-10-25 LAB — PTH-RELATED PEPTIDE: PTH-related peptide: <2 pmol/L

## 2018-10-25 LAB — FOLATE: Folate: 22.6 ng/mL (ref >5.9)

## 2018-10-25 LAB — FERRITIN: Ferritin: 121 ng/mL (ref 11–307)

## 2018-10-25 LAB — POTASSIUM: Potassium: 3.1 mmol/L — ABNORMAL LOW (ref 3.5–5.1)

## 2018-10-25 LAB — MAGNESIUM: Magnesium: 1.8 mg/dL (ref 1.7–2.4)

## 2018-10-25 LAB — VITAMIN B12: Vitamin B-12: 665 pg/mL (ref 180–914)

## 2018-10-25 LAB — HEPARIN LEVEL (UNFRACTIONATED): Heparin Unfractionated: <0.1 IU/mL — ABNORMAL LOW (ref 0.30–0.70)

## 2018-10-25 MED ORDER — CLOPIDOGREL BISULFATE 75 MG PO TABS
75.0000 mg | ORAL_TABLET | ORAL | Status: AC
  Administered 2018-10-26: 75 mg via ORAL
  Filled 2018-10-25: qty 1

## 2018-10-25 MED ORDER — PROMETHAZINE HCL 25 MG/ML IJ SOLN
12.5000 mg | Freq: Four times a day (QID) | INTRAMUSCULAR | Status: DC | PRN
  Administered 2018-10-26 – 2018-10-31 (×2): 12.5 mg via INTRAVENOUS
  Filled 2018-10-25 (×2): qty 1

## 2018-10-25 MED ORDER — CLOPIDOGREL BISULFATE 75 MG PO TABS
300.0000 mg | ORAL_TABLET | Freq: Once | ORAL | Status: AC
  Administered 2018-10-25: 300 mg via ORAL
  Filled 2018-10-25: qty 4

## 2018-10-25 MED ORDER — ASPIRIN EC 325 MG PO TBEC: 325.0000 mg | DELAYED_RELEASE_TABLET | ORAL | Status: DC

## 2018-10-25 MED ORDER — SODIUM CHLORIDE 0.9 % IV SOLN
INTRAVENOUS | Status: DC
  Administered 2018-10-26: 03:00:00 via INTRAVENOUS

## 2018-10-25 MED ORDER — POTASSIUM CHLORIDE CRYS ER 20 MEQ PO TBCR
40.0000 meq | EXTENDED_RELEASE_TABLET | Freq: Two times a day (BID) | ORAL | Status: AC
  Administered 2018-10-25 – 2018-10-26 (×2): 40 meq via ORAL
  Filled 2018-10-25 (×2): qty 2

## 2018-10-25 MED ORDER — CLOPIDOGREL BISULFATE 75 MG PO TABS
75.0000 mg | ORAL_TABLET | Freq: Every day | ORAL | Status: DC
  Administered 2018-10-27 – 2018-10-30 (×4): 75 mg via ORAL
  Filled 2018-10-25 (×4): qty 1

## 2018-10-25 MED ORDER — BOOST / RESOURCE BREEZE PO LIQD CUSTOM
1.0000 | Freq: Three times a day (TID) | ORAL | Status: DC
  Administered 2018-10-25 – 2018-11-01 (×8): 1 via ORAL

## 2018-10-25 MED ORDER — NIMODIPINE 30 MG PO CAPS: 0.0000 mg | ORAL_CAPSULE | ORAL | Status: DC

## 2018-10-25 MED ORDER — CEFAZOLIN SODIUM-DEXTROSE 2-4 GM/100ML-% IV SOLN
2.0000 g | INTRAVENOUS | Status: AC
  Administered 2018-10-26: 2 g via INTRAVENOUS
  Filled 2018-10-25: qty 100

## 2018-10-25 NOTE — Anesthesia Preprocedure Evaluation (Deleted)
Anesthesia Evaluation  Patient identified by MRN, date of birth, ID band Patient awake    Reviewed: Allergy & Precautions, H&P , NPO status , Patient's Chart, lab work & pertinent test results, reviewed documented beta blocker date and time   History of Anesthesia Complications (+) PONV and history of anesthetic complications  Airway Mallampati: II  TM Distance: >3 FB Neck ROM: full    Dental no notable dental hx.    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    Pulmonary exam normal breath sounds clear to auscultation       Cardiovascular Exercise Tolerance: Good hypertension, negative cardio ROS   Rhythm:regular Rate:Normal     Neuro/Psych  Headaches, PSYCHIATRIC DISORDERS Anxiety Depression    GI/Hepatic Neg liver ROS, hiatal hernia, GERD  ,  Endo/Other  negative endocrine ROS  Renal/GU negative Renal ROS  negative genitourinary   Musculoskeletal   Abdominal   Peds  Hematology  (+) Blood dyscrasia, anemia ,   Anesthesia Other Findings   Reproductive/Obstetrics negative OB ROS                            Anesthesia Physical Anesthesia Plan  ASA: III  Anesthesia Plan: General   Post-op Pain Management:    Induction:   PONV Risk Score and Plan: 3 and Ondansetron and Treatment may vary due to age or medical condition  Airway Management Planned: Oral ETT  Additional Equipment: Arterial line  Intra-op Plan:   Post-operative Plan: Extubation in OR  Informed Consent: I have reviewed the patients History and Physical, chart, labs and discussed the procedure including the risks, benefits and alternatives for the proposed anesthesia with the patient or authorized representative who has indicated his/her understanding and acceptance.     Dental Advisory  Given  Plan Discussed with: CRNA, Anesthesiologist and Surgeon  Anesthesia Plan Comments:        Anesthesia Quick Evaluation

## 2018-10-25 NOTE — Progress Notes (Signed)
PROGRESS NOTE    [name]  ZOX:[id number] DOB: 21-Nov-1970 DOA: 10/19/2018 PCP: Tresa Garter, MD   Brief Narrative:  48 year old female who presented with nausea, vomiting,andleft eye pain.She does have significant past medical history for Barrett's esophagus and hypertension. Was recently diagnosed with a 9 mm right ICA aneurysm which repair was on hold due to current COVID pandemic.Her initial physical examination blood pressure 138/96, heart rate 89, respirate 16, temperature 98, oxygen saturation 98%. She had mild proptosis of the left eye, subconjunctival hematoma and positive discomfort with eye movement. Moist mucous membranes, lungs clear to auscultation bilaterally, heart S1-S2 present rhythmic, abdomen soft, no lower extremity edema. CT angiography of the head showed abnormal fat stranding involving the intraconal fat of the left orbit with associated extraocular muscle enlargement and engorgement of the left superior orbital vein, findings concerning for possible cavernous carotid fistula or arteriovenous shunt about the cavernous sinus.Chest film with no infiltrates, left base pleural thickening, costophrenic angle.  Patient was admitted to the hospital with a working diagnosis ofleftcarotid cavernous fistula Assessment & Plan:   Principal Problem:   Carotid-cavernous fistula Active Problems:   Essential hypertension   Hypokalemia   Hypercalcemia   Aneurysm of right internal carotid artery   Acquired left carotid-cavernous fistula    Left ICA cavernous fistula s/p staged embolization using coils and stenting 10/23/2018 by Dr. Corliss Skains. Further recommendations as per IR.  Plan for staged embolization of the large fast flow lt ICA [facility]CF fistula with coiling and stenting tomorrow .  Continue with aspirin and plavix.   Hypertension:  Well controlled.  Holding the HCTZ.    Hypokalemia and hypomagnesemia:  Replaced.  REPEAT potassium tonight.    Anemia of chronic disease vs anemia of blood loss  ? - anemia panel reviewed.  - iron levels adequate.  - will check for stool for occult blood.     GERD: Improved.    Anxiety:  On diazepam.    DVT prophylaxis: scd's Code Status: full code.  Family Communication: none at bedside.  Disposition Plan: pending clinical improvement.    Consultants:   IR   Procedures:  Left ICA cavernous fistula s/p staged embolization using coils and stenting 10/23/2018 by Dr. Corliss Skains.   Antimicrobials: none.    Subjective: Pain in the left eye with diplopia, no bM today.  Pt reports nauseated.  No vomiting or abdominal pain.   Objective: Vitals:   10/25/18 1000 10/25/18 1100 10/25/18 1200 10/25/18 1300  BP:  122/80 (!) 131/94 133/87  Pulse: 81 67 73 78  Resp: 14 18 (!) 25 16  Temp:   98.9 F (37.2 C)   TempSrc:   Oral   SpO2: 99% 96% 96% 99%  Weight:      Height:        Intake/Output Summary (Last 24 hours) at 10/25/2018 1434 Last data filed at 10/25/2018 1417 Gross per 24 hour  Intake 2383.99 ml  Output 525 ml  Net 1858.99 ml   Filed Weights   10/19/18 2206 10/20/18 0642  Weight: 50.3 kg 47.2 kg    Examination:  General exam: left eye ecchyomosis , mild distress from pain in the left eye.  Respiratory system: Clear to auscultation. Respiratory effort normal. No wheezing or rhonchi.  Cardiovascular system: S1 & S2 heard, RRR. No JVD,  No pedal edema. Gastrointestinal system: Abdomen is soft NT ND bs+ Central nervous system: Alert and oriented.  Extremities: Symmetric 5 x 5 power. Skin: No rashes, lesions or ulcers  Psychiatry: . Mood & affect appropriate.     Data Reviewed: I have personally reviewed following labs and imaging studies  CBC: Recent Labs  Lab 10/19/18 1837 10/21/18 0401 10/23/18 0613 10/24/18 0425 10/24/18 1230 10/25/18 1011  WBC 11.9* 14.9* 12.8* 12.5*  --  18.0*  NEUTROABS  --  9.8* 8.0* 9.1*  --  14.5*  HGB 16.5* 12.2 13.2 9.4* 9.4*  10.8*  HCT 48.3* 35.7* 38.6 28.3* 28.3* 32.2*  MCV 86.6 87.3 87.9 90.7  --  89.9  PLT 350 271 291 219  --  222   Basic Metabolic Panel: Recent Labs  Lab 10/21/18 0401 10/22/18 0514 10/23/18 0613 10/24/18 0425 10/25/18 0334 10/25/18 0906  NA 139 141 140 140 140  --   K 2.6* 3.0* 3.7 3.3* 2.9*  --   CL 103 107 107 109 107  --   CO2 22 24 22  17* 23  --   GLUCOSE 92 89 94 106* 106*  --   BUN 12 11 13 13 9   --   CREATININE 0.71 0.86 0.76 0.76 0.80  --   CALCIUM 9.2 9.0 9.3 8.0* 8.1*  --   MG  --  1.7 2.2  --   --  1.8   GFR: Estimated Creatinine Clearance: 64.1 mL/min (by C-G formula based on SCr of 0.8 mg/dL). Liver Function Tests: Recent Labs  Lab 10/19/18 1837  AST 25  ALT 12  ALKPHOS 57  BILITOT 1.4*  PROT 8.6*  ALBUMIN 5.2*   Recent Labs  Lab 10/19/18 1837  LIPASE 23   No results for input(s): AMMONIA in the last 168 hours. Coagulation Profile: Recent Labs  Lab 10/22/18 0923  INR 1.0   Cardiac Enzymes: No results for input(s): CKTOTAL, CKMB, CKMBINDEX, TROPONINI in the last 168 hours. BNP (last 3 results) No results for input(s): PROBNP in the last 8760 hours. HbA1C: No results for input(s): HGBA1C in the last 72 hours. CBG: Recent Labs  Lab 10/23/18 1453  GLUCAP 151*   Lipid Profile: No results for input(s): CHOL, HDL, LDLCALC, TRIG, CHOLHDL, LDLDIRECT in the last 72 hours. Thyroid Function Tests: No results for input(s): TSH, T4TOTAL, FREET4, T3FREE, THYROIDAB in the last 72 hours. Anemia Panel: Recent Labs    10/25/18 1011  VITAMINB12 665  FOLATE 22.6  FERRITIN 121  TIBC 297  IRON 34   Sepsis Labs: No results for input(s): PROCALCITON, LATICACIDVEN in the last 168 hours.  Recent Results (from the past 240 hour(s))  Novel Coronavirus, NAA (hospital order; send-out to ref lab)     Status: None   Collection Time: 10/22/18  2:57 PM  Result Value Ref Range Status   SARS-CoV-2, NAA NOT DETECTED NOT DETECTED Final    Comment: (NOTE) This  test was developed and its performance characteristics determined by [employer] [facility]. This test has not been FDA cleared or approved. This test has been authorized by FDA under an Emergency Use Authorization (EUA). This test is only authorized for the duration of time the declaration that circumstances exist justifying the authorization of the emergency use of in vitro diagnostic tests for detection of SARS-CoV-2 virus and/or diagnosis of COVID-19 infection under section 564(b)(1) of the Act, 21 U.S.C. 161WRU-0(A)(5360bbb-3(b)(1), unless the authorization is terminated or revoked sooner. When diagnostic testing is negative, the possibility of a false negative result should be considered in the context of a patient's recent exposures and the presence of clinical signs and symptoms consistent with COVID-19. An individual without symptoms of COVID-19 and  who is not shedding SARS-CoV-2 virus would expect to have a negative (not detected) result in this assay. Performed  At: [facility] [address] [place], [place] [id number] Jolene Schimke MD UX:[id number]    Coronavirus Source NASOPHARYNGEAL  Final    Comment: Performed at [facility], [address] [place], [place] [address]  Surgical pcr screen     Status: None   Collection Time: 10/23/18  6:16 AM  Result Value Ref Range Status   MRSA, PCR NEGATIVE NEGATIVE Final   Staphylococcus aureus NEGATIVE NEGATIVE Final    Comment: (NOTE) The Xpert SA Assay (FDA approved for NASAL specimens in patients 79 years of age and older), is one component of a comprehensive surveillance program. It is not intended to diagnose infection nor to guide or monitor treatment. Performed at [facility], [address] [place], [place] [address]          Radiology Studies: No results found.      Scheduled Meds: . aspirin  325 mg Oral Daily   Or  . aspirin  324 mg Per Tube Daily  . clopidogrel  300 mg Oral Once  . [START  ON 10/26/2018] clopidogrel  75 mg Oral Daily  . feeding supplement  1 Container Oral TID BM  . potassium chloride  40 mEq Oral BID   Continuous Infusions: . sodium chloride 75 mL/hr at 10/25/18 1300     LOS: 5 days    Time spent: 32 minutes.     Kathlen Mody, MD [facility] Pager [phone]  If 7PM-7AM, please contact night-coverage [email or web address] Password [facility] 10/25/2018, 2:34 PM

## 2018-10-25 NOTE — Progress Notes (Signed)
Referring Physician(s): Dr Blake DivineAkula Dr Ella JubileeArrien   Supervising Physician: [name], [name]  [facility]  [facility]  Chief Complaint:  Patient with history of large left ICA cavernous fistula s/p cerebral arteriogram with staged embolization of large left ICA cavernous fistula with coiling and stenting 10/23/2018 by Dr. [name].    Subjective:  Evaluation of pt with Dr [name] this am Pt denies N/V Denies headache Denies vision changes -- still has left gaze diplopia Left eye 'grittiness" in motion   Has eaten last night-- not yet this am Has been up to restroom with help   Allergies: Guaifenesin; Iron; Doxepin; Celexa [citalopram hydrobromide]; Codeine; Hydrocodone-acetaminophen; Metoprolol succinate; Pseudoeph-doxylamine-dm-apap; Pseudoephedrine; Sulfa antibiotics; Hydromorphone hcl; Morphine; Pantoprazole sodium; Prednisone; and Tetracyclines & related  Medications: Prior to Admission medications   Medication Sig Start Date End Date Taking? Authorizing Provider  allopurinol (ZYLOPRIM) 100 MG tablet Take 1 tablet (100 mg total) by mouth daily. Must keep visit on 11/03/17 to get refills Patient taking differently: Take 100 mg by mouth daily as needed (for gout flare up). Must keep visit on 11/03/17 to get refills 11/03/17  Yes Plotnikov, Georgina QuintAleksei V, MD  aspirin-acetaminophen-caffeine (EXCEDRIN MIGRAINE) [phone] MG tablet Take 1 tablet by mouth every 6 (six) hours as needed for headache or migraine.    Yes [provider]  cetirizine (ZYRTEC) 10 MG tablet Take 10 mg by mouth daily as needed for allergies.   Yes [provider]  Cholecalciferol (VITAMIN D3) 2000 units TABS Take 2,000 Units by mouth daily.    Yes [provider]  diazepam (VALIUM) 5 MG tablet TAKE 1 TABLET BY MOUTH EVERY 12 HOURS IF NEEDED FOR ANXIETY Patient taking differently: Take 5 mg by mouth every 12 (twelve) hours as needed for anxiety. take 1 tablet by mouth every 12  hours if needed for anxiety 05/16/18  Yes Plotnikov, Georgina QuintAleksei V, MD  ferrous gluconate (IRON 27) 240 (27 FE) MG tablet Take 240 mg by mouth daily.    Yes [provider]  hydrochlorothiazide (HYDRODIURIL) 25 MG tablet Take 25 mg by mouth daily.    Yes [provider]  Multiple Vitamins-Minerals (MULTIVITAMIN ADULTS PO) Take 1 tablet by mouth daily.    Yes [provider]  potassium chloride SA (K-DUR,KLOR-CON) 20 MEQ tablet Take 2 tablets (40 mEq total) by mouth every morning. Patient taking differently: Take 20 mEq by mouth daily.  06/16/18  Yes Hongalgi, Maximino GreenlandAnand D, MD  promethazine (PHENERGAN) 12.5 MG tablet Take 1 tablet (12.5 mg total) by mouth every 8 (eight) hours as needed for nausea or vomiting. 10/19/18  Yes Myrlene Brokerrawford, Elizabeth A, MD  metroNIDAZOLE (FLAGYL) 500 MG tablet Take 1 tablet (500 mg total) by mouth 2 (two) times daily. Patient not taking: Reported on 07/11/2018 06/21/18   Plotnikov, Georgina QuintAleksei V, MD  oseltamivir (TAMIFLU) 75 MG capsule Take 1 capsule (75 mg total) by mouth 2 (two) times daily. Patient not taking: Reported on 10/20/2018 08/17/18   Myrlene Brokerrawford, Elizabeth A, MD     Vital Signs: BP 118/79   Pulse 73   Temp 99 F (37.2 C) (Axillary)   Resp (!) 23   Ht 5\' 7"  (1.702 m)   Wt 104 lb 0.9 oz (47.2 kg)   SpO2 95%   BMI 16.30 kg/m   Physical Exam Vitals signs reviewed.  HENT:     Head: Atraumatic.     Mouth/Throat:     Pharynx: Oropharynx is clear.     Comments: Tongue midline No  swelling Face is symmetrical Smile = Eyes:     Comments: Rt eye with 2 mm pupil; +Reactive Left eye 4-5 mm pupil-- minimally reactive  No changes in left eye chemosis; proptosis + left eyelid droop    Musculoskeletal: Normal range of motion.        General: No tenderness.     Comments: Left arm ecchymosis noted from above wrist to elbow  Skin:    General: Skin is warm and dry.     Findings: Bruising present.     Comments: Left arm  Neurological:      General: No focal deficit present.     Mental Status: She is alert and oriented to person, place, and time.  Psychiatric:        Mood and Affect: Mood normal.        Behavior: Behavior normal.        Thought Content: Thought content normal.        Judgment: Judgment normal.     Imaging: No results found.  Labs:  CBC: Recent Labs    10/19/18 1837 10/21/18 0401 10/23/18 0613 10/24/18 0425 10/24/18 1230  WBC 11.9* 14.9* 12.8* 12.5*  --   HGB 16.5* 12.2 13.2 9.4* 9.4*  HCT 48.3* 35.7* 38.6 28.3* 28.3*  PLT 350 271 291 219  --     COAGS: Recent Labs    10/22/18 0923  INR 1.0    BMP: Recent Labs    10/22/18 0514 10/23/18 0613 10/24/18 0425 10/25/18 0334  NA 141 140 140 140  K 3.0* 3.7 3.3* 2.9*  CL 107 107 109 107  CO2 24 22 17* 23  GLUCOSE 89 94 106* 106*  BUN 11 13 13 9   CALCIUM 9.0 9.3 8.0* 8.1*  CREATININE 0.86 0.76 0.76 0.80  GFRNONAA >60 >60 >60 >60  GFRAA >60 >60 >60 >60    LIVER FUNCTION TESTS: Recent Labs    11/28/17 0434 11/29/17 0446 07/11/18 1300 10/19/18 1837  BILITOT 0.9 0.7 1.5* 1.4*  AST 24 20 45* 25  ALT 18 17 13 12   ALKPHOS 55 46 49 57  PROT 7.5 6.5 7.6 8.6*  ALBUMIN 4.2 3.7 4.3 5.2*    Assessment and Plan:  Staged embolization of large fast flow Lt ICA [facility]  fistula  with coiling and stenting. Checking Hg and UA If hg is stable or higher and no sign of infection We will plan for additional treatment Fri 5/8 with anesthesia Pt is aware of plan Dr Corliss Skains spoke to Dr Blake Divine   Electronically Signed: Robet Leu, PA-C 10/25/2018, 10:06 AM   I spent a total of 25 Minutes at the the patient's bedside AND on the patient's hospital floor or unit, greater than 50% of which was counseling/coordinating care for L ICA [facility] fistula treatment

## 2018-10-25 NOTE — Progress Notes (Signed)
Initial Nutrition Assessment  DOCUMENTATION CODES:   Underweight  INTERVENTION:   Boost Breeze po TID, each supplement provides 250 kcal and 9 grams of protein  Encourage PO intake  NUTRITION DIAGNOSIS:   Increased nutrient needs related to chronic illness as evidenced by estimated needs.  GOAL:   Patient will meet greater than or equal to 90% of their needs  MONITOR:   PO intake, Supplement acceptance  REASON FOR ASSESSMENT:   Other (Comment)    ASSESSMENT:   Pt with PMH of Barrett's esophagus and HTN who was recenlty diagnosed with ICA aneurysm (repair was on hold due to Twining pandemic). 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     Plan for further surgery tomorrow and will be NPO after midnight.  Per chart review pt met criteria for moderate malnutrition in 05/2018. Weight has continued to decrease. Pt's BMI is now underweight. Suspect malnutrition has continued or worsened. During previous admission pt did not tolerate ensure but did consumed boost breeze. Will offer while hospitalized.   Medications reviewed and include: KCl 40 mEq BID Labs reviewed: K+ 2.9 (L)    NUTRITION - FOCUSED PHYSICAL EXAM:  Deferred   Diet Order:   Diet Order            Diet NPO time specified  Diet effective midnight        Diet full liquid Room service appropriate? Yes; Fluid consistency: Thin  Diet effective now              EDUCATION NEEDS:   No education needs have been identified at this time  Skin:  Skin Assessment: Reviewed RN Assessment(incisions)  Last BM:  5/4  Height:   Ht Readings from Last 1 Encounters:  10/20/18 _0  (1.702 m)    Weight:   Wt Readings from Last 1 Encounters:  10/20/18 47.2 kg    Ideal Body Weight:  61.3 kg  BMI:  Body mass index is 16.3 kg/m.  Estimated Nutritional Needs:   Kcal:  1500-1700  Protein:  75-100 grams  Fluid:  > 1.5 L/day  Maylon Peppers RD, LDN, CNSC [phone] Pager [phone] After Hours Pager

## 2018-10-25 NOTE — Consult Note (Signed)
Chief Complaint: Patient was seen in consultation today for left ICA cavernous fistula/staged embolization.  Referring Physician(s): Etta Quill  Supervising Physician: Luanne Bras  Patient Status: [facility] - In-pt  History of Present Illness: [name] is a 48 y.o. female with a past medical history of hypertension, migraines, pulmonary embolism 2005, DVT 2005, hiatal hernia, GERD, Barrett's esophagus, iron deficiency anemia, vitamin D deficiency, B12 deficiency, gout, anxiety, and depression. She is known to [facility] and has been followed by Dr. Estanislado Pandy since 07/2018. She first presented to our department at the request of Dr. [name] for management of bilateral ICA aneurysms. She consulted with Dr. Estanislado Pandy 08/17/2018 to discuss management options. At that time, patient decided to pursue endovascular intervention for her aneurysms. Unfortunately, due to restrictions regarding COVID-19, this has been postponed.  Patient presented to [facility] ED 10/19/2018 with complaint of tinnitus and left eye pain. 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 At that time, patient decided to pursue endovascular intervention for her left ICA cavernous fistula. She underwent an image-guided cerebral arteriogram with staged embolization of her left ICA cavernous fistula with coiling and stenting 10/23/2018 with Dr. Estanislado Pandy. Patient has remained admitted to [facility] since procedure for observation. Since procedure, it appears that patient's left eye has worsened (worsening erythema/eccymosis, subjective diplopia on lateral gaze, pupil size 4 mm). Patient has been seen and evaluated by Dr. Estanislado Pandy daily who recommends staged embolization of left ICA cavernous fistula tomorrow 10/26/2018.  Patient awake and alert laying in bed.  Complains of "grittiness" of her left eye. Denies fever, chills, chest pain, dyspnea, abdominal pain, or headache.  Patient is currently taking Aspirin 325 mg once daily.   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---   Depression    GERD (gastroesophageal reflux disease)    Hiatal hernia    History of DVT of lower extremity    08-14-2003  RIGHT LOWER EXTREMITY-- TREATED W/ COUMADIN----  per pt none since   History of palpitations    Hypertension    Idiopathic chronic gout    12-20-2017  per pt stable ,   last episode 02/ 2019   Iron deficiency anemia    Migraines    Personal history of PE (pulmonary embolism)    03/ 2005 RIGHT SIDE  --- treated w/ coumadin---  per pt none since   PONV (postoperative nausea and vomiting)    Tinea versicolor    Vitamin D deficiency     Past Surgical History:  Procedure Laterality Date   BIOPSY  11/29/2017   Procedure: BIOPSY;  Surgeon: Clarene Essex, MD;  Location: [facility];  Service: Endoscopy;;   BRONCHOSCOPY  05-20-2004  dr clance   ESOPHAGOGASTRODUODENOSCOPY (EGD) WITH PROPOFOL N/A 11/29/2017   Procedure: ESOPHAGOGASTRODUODENOSCOPY (EGD) WITH PROPOFOL;  Surgeon: Clarene Essex, MD;  Location: [facility] ENDOSCOPY;  Service: Endoscopy;  Laterality: N/A;   EXPLORATORY LAPAROTOMY  AGE 63    [place]   for constipation   INSERTION OF MESH N/A 12/26/2017   Procedure: INSERTION OF BIO A MESH;  Surgeon: Ralene Ok, MD;  Location: [facility] ORS;  Service: General;  Laterality: N/A;  IR ANGIO INTRA EXTRACRAN SEL COM CAROTID INNOMINATE BILAT MOD SED  10/20/2018   IR ANGIO INTRA EXTRACRAN SEL INTERNAL CAROTID UNI L MOD SED  10/23/2018   IR ANGIO VERTEBRAL SEL VERTEBRAL UNI L MOD SED  10/20/2018   IR ANGIOGRAM FOLLOW UP STUDY  10/23/2018   IR TRANSCATH/EMBOLIZ  10/23/2018   KNEE  ARTHROSCOPY W/ ACL RECONSTRUCTION Right 09/2003   LAPAROSCOPY BILATERAL TUBAL FULGERATION AND ATTEMPTED THERMACHOICE ABLATION WITH UTERINE PERFORATION  07-17-2009   dr meisinger  [place]   right lower lobe for massive hemoptyosis   MINI-THORACOTOMY WEDGE RESECTION OF LEFT LOWER LOBE CYST Left 06-24-2004   dr Arlyce Dice [facility]   hemoptyosis   RADIOLOGY WITH ANESTHESIA N/A 10/23/2018   Procedure: IR WITH ANESTHESIA FOR EMBOLIZATION;  Surgeon: Luanne Bras, MD;  Location: [place];  Service: Radiology;  Laterality: N/A;    Allergies: Guaifenesin; Iron; Doxepin; Pseudoeph-doxylamine-dm-apap; Pseudoephedrine; Sulfa antibiotics; Hydrocodone-acetaminophen; Metoprolol succinate; Celexa [citalopram hydrobromide]; Codeine; Hydromorphone hcl; Morphine; Pantoprazole sodium; Prednisone; and Tetracyclines & related  Medications: Prior to Admission medications   Medication Sig Start Date End Date Taking? Authorizing Provider  allopurinol (ZYLOPRIM) 100 MG tablet Take 1 tablet (100 mg total) by mouth daily. Must keep visit on 11/03/17 to get refills Patient taking differently: Take 100 mg by mouth daily as needed (for gout flare up). Must keep visit on 11/03/17 to get refills 11/03/17  Yes Plotnikov, Evie Lacks, MD  aspirin-acetaminophen-caffeine (EXCEDRIN MIGRAINE) [phone] MG tablet Take 1 tablet by mouth every 6 (six) hours as needed for headache or migraine.    Yes [provider]  cetirizine (ZYRTEC) 10 MG tablet Take 10 mg by mouth daily as needed for allergies.   Yes [provider]  Cholecalciferol (VITAMIN D3) 2000 units TABS Take 2,000 Units by mouth daily.    Yes [provider]  diazepam (VALIUM) 5 MG tablet TAKE 1 TABLET BY MOUTH EVERY 12 HOURS IF NEEDED FOR ANXIETY Patient taking differently: Take 5 mg by mouth every 12 (twelve) hours as needed for anxiety. take 1 tablet by mouth every 12 hours if needed for anxiety 05/16/18  Yes Plotnikov, Evie Lacks,  MD  ferrous gluconate (IRON 27) 240 (27 FE) MG tablet Take 240 mg by mouth daily.    Yes [provider]  hydrochlorothiazide (HYDRODIURIL) 25 MG tablet Take 25 mg by mouth daily.    Yes [provider]  Multiple Vitamins-Minerals (MULTIVITAMIN ADULTS PO) Take 1 tablet by mouth daily.    Yes [provider]  potassium chloride SA (K-DUR,KLOR-CON) 20 MEQ tablet Take 2 tablets (40 mEq total) by mouth every morning. Patient taking differently: Take 20 mEq by mouth daily.  06/16/18  Yes Hongalgi, Lenis Dickinson, MD  promethazine (PHENERGAN) 12.5 MG tablet Take 1 tablet (12.5 mg total) by mouth every 8 (eight) hours as needed for nausea or vomiting. 10/19/18  Yes Hoyt Koch, MD  metroNIDAZOLE (FLAGYL) 500 MG tablet Take 1 tablet (500 mg total) by mouth 2 (two) times daily. Patient not taking: Reported on 07/11/2018 06/21/18   Plotnikov, Evie Lacks, MD  oseltamivir (TAMIFLU) 75 MG capsule Take 1 capsule (75 mg total) by mouth 2 (two) times daily. Patient not taking: Reported on 10/20/2018 08/17/18   Hoyt Koch, MD     Family History  Problem Relation Age of Onset   Hypertension Other    Diabetes Other     Social History   Socioeconomic History   Marital status: Single  Spouse name: Not on file   Number of children: Not on file   Years of education: Not on file   Highest education level: Not on file  Occupational History   Not on file  Social Needs   Financial resource strain: Not on file   Food insecurity:    Worry: Not on file    Inability: Not on file   Transportation needs:    Medical: Not on file    Non-medical: Not on file  Tobacco Use   Smoking status: Never Smoker   Smokeless tobacco: Never Used  Substance and Sexual Activity   Alcohol use: Yes    Comment: rare   Drug use: No   Sexual activity: Not on file    Comment: BTL  Lifestyle   Physical activity:    Days per week: Not on file    Minutes per session: Not on  file   Stress: Not on file  Relationships   Social connections:    Talks on phone: Not on file    Gets together: Not on file    Attends religious service: Not on file    Active member of club or organization: Not on file    Attends meetings of clubs or organizations: Not on file    Relationship status: Not on file  Other Topics Concern   Not on file  Social History Narrative   Not on file     Review of Systems: A 12 point ROS discussed and pertinent positives are indicated in the HPI above.  All other systems are negative.  Review of Systems  Constitutional: Negative for chills and fever.  Eyes: Positive for pain.  Respiratory: Negative for shortness of breath and wheezing.   Cardiovascular: Negative for chest pain and palpitations.  Gastrointestinal: Negative for abdominal pain.  Neurological: Negative for headaches.  Psychiatric/Behavioral: Negative for behavioral problems and confusion.    Vital Signs: BP 133/87    Pulse 78    Temp 98.9 F (37.2 C) (Oral)    Resp 16    Ht 5' 7"  (1.702 m)    Wt 104 lb 0.9 oz (47.2 kg)    SpO2 99%    BMI 16.30 kg/m   Physical Exam Vitals signs and nursing note reviewed.  Constitutional:      General: She is not in acute distress.    Appearance: Normal appearance.  Eyes:     Comments:  Left eye with stable erythema and ecchymosis compared to yesterday.   Cardiovascular:     Rate and Rhythm: Normal rate and regular rhythm.     Heart sounds: Normal heart sounds. No murmur.  Pulmonary:     Effort: Pulmonary effort is normal. No respiratory distress.     Breath sounds: Normal breath sounds. No wheezing.  Skin:    General: Skin is warm and dry.     Comments: Right groin incision soft without active bleeding or hematoma.  Neurological:     Mental Status: She is alert.     Comments: Alert, awake, and oriented x3. Speech and comprehension intact. Left pupil 4 mm and sluggish to react, right pupil 60m and brisk to react. Decreased  abduction of left eye on left lateral gaze with subjective diplopia. Visual fields intact bilaterally. No facial asymmetry. Tongue midline. Motor power symmetric proportional to effort. No pronator drift. Fine motor and coordination, gait, romberg, heel to toe not assessed. Distal pulses 1+ bilaterally.   Psychiatric:        Mood  and Affect: Mood normal.        Behavior: Behavior normal.        Thought Content: Thought content normal.        Judgment: Judgment normal.      MD Evaluation Airway: WNL Heart: WNL Abdomen: WNL Chest/ Lungs: WNL ASA  Classification: 3 Mallampati/Airway Score: One   Imaging: Ct Angio Head W Or Wo Contrast  Result Date: 10/20/2018 CLINICAL DATA:  Initial evaluation for acute headache. EXAM: CT ANGIOGRAPHY HEAD AND NECK TECHNIQUE: Multidetector CT imaging of the head and neck was performed using the standard protocol during bolus administration of intravenous contrast. Multiplanar CT image reconstructions and MIPs were obtained to evaluate the vascular anatomy. Carotid stenosis measurements (when applicable) are obtained utilizing NASCET criteria, using the distal internal carotid diameter as the denominator. CONTRAST:  153m OMNIPAQUE IOHEXOL 350 MG/ML SOLN COMPARISON:  Prior CTA from 06/29/2018. FINDINGS: CT HEAD FINDINGS Brain: Cerebral volume stable, and within normal limits. No acute intracranial hemorrhage. No acute large vessel territory infarct. No mass lesion, midline shift or mass effect. No hydrocephalus. No extra-axial fluid collection. Vascular: No asymmetric hyperdense vessel. Skull: Scalp soft tissues demonstrate no acute finding. Calvarium intact. Sinuses: Air-fluid level noted within the dominant left sphenoid sinus. Paranasal sinuses are otherwise clear. Mastoid air cells and middle ear cavities are well pneumatized and free of fluid. Soft tissue density within the left EAC likely reflects cerumen. Orbits: Asymmetric soft tissue stranding seen  involving the intraconal fat of the left orbit (series 5, image 118). Left lateral rectus muscle appears enlarged, new from previous. Globes remain symmetric in size with normal morphology and appearance. Review of the MIP images confirms the above findings CTA NECK FINDINGS Aortic arch: Visualized aortic arch normal in caliber with normal 3 vessel morphology. Minimal plaque at the origin of the left subclavian artery. No flow-limiting stenosis about the origin of the great vessels. Subclavian arteries otherwise widely patent. Right carotid system: Right common carotid artery widely patent from its origin to the bifurcation without abnormality or stenosis. No atheromatous narrowing about the right carotid bifurcation. Multiple segments of ectasias again seen involving the mid-distal right ICA measuring up to 9 mm in maximal diameter, similar to previous. Superimposed saccular aneurysm at the distal right ICA just proximal to the skull base relatively stable measuring 8 x 7 x 8 mm (transverse by craniocaudad by AP, series 12, image 111). No dissection or stenosis. Left carotid system: Left common carotid artery patent from its origin to the bifurcation without abnormality. No atheromatous narrowing about the left bifurcation. Aneurysmal dilatation of the distal left ICA up to 12 mm, relatively similar. No dissection or stenosis. Vertebral arteries: Both of the vertebral arteries arise from the subclavian arteries. Left vertebral artery dominant. No evidence for dissection or flow-limiting stenosis. Focal ectasia up at the level of C2 measuring up to 6 mm on the left and 5 mm on the right, stable. Skeleton: Mild multilevel cervical spondylolysis without significant spinal stenosis, stable. Other neck: No other acute soft tissue abnormality within the neck. Few subcentimeter hypodense thyroid nodules noted, of doubtful significance. Upper chest: Visualized upper chest demonstrates no acute finding. Review of the MIP  images confirms the above findings CTA HEAD FINDINGS Anterior circulation: Petrous segments patent bilaterally. Cavernous/supraclinoid ICAs patent without flow-limiting stenosis or definite abnormality. Adjacent venous contamination within the cavernous sinus limits evaluation. There is asymmetric engorgement and filling of the left superior orbital vein, raising the possibility for CC fistula (series 12, image  71). Additionally, there is asymmetric filling of the left inferior petrosal sinus. A1 segments patent bilaterally. Normal anterior communicating artery. Anterior cerebral arteries well perfused to their distal aspects. M1 segments patent bilaterally. Distal MCA branches well perfused and symmetric. Posterior circulation: Left vertebral artery ectatic measuring up to 5 mm, similar to previous. Right vertebral artery diffusely hypoplastic. Posterior inferior cerebral arteries patent proximally. Basilar patent to its distal aspect. Superior cerebral arteries perfuse bilaterally. Both of the PCAs patent to their distal aspects without stenosis. Venous sinuses: Concern for left-sided CC fistula or possibly dural AVF as above. Venous sinuses otherwise grossly patent allowing for timing of the contrast bolus. Anatomic variants: Tiny right posterior communicating artery noted. No other significant anatomic variant. No appreciable aneurysm. Delayed phase: No abnormal enhancement. Review of the MIP images confirms the above findings IMPRESSION: 1. Abnormal fat stranding involving the intraconal fat of the left orbit with associated extraocular muscle enlargement and engorgement of the left superior orbital vein. Findings concerning for possible cavernous carotid fistula or other arteriovenous shunt about the cavernous sinus. Further assessment with dedicated catheter directed arteriogram recommended for further evaluation. 2. Otherwise stable CTA with multiple segments of ectasia involving the carotid and vertebral  arteries bilaterally. 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 Again, findings may reflect fibromuscular dysplasia or underlying connective tissue disorder. 3. No large vessel occlusion, arterial dissection, or hemodynamically significant stenosis identified. 4. No other acute intracranial abnormality. Electronically Signed   By: Jeannine Boga M.D.   On: 10/20/2018 02:29   Ct Angio Neck W And/or Wo Contrast  Result Date: 10/20/2018 CLINICAL DATA:  Initial evaluation for acute headache. EXAM: CT ANGIOGRAPHY HEAD AND NECK TECHNIQUE: Multidetector CT imaging of the head and neck was performed using the standard protocol during bolus administration of intravenous contrast. Multiplanar CT image reconstructions and MIPs were obtained to evaluate the vascular anatomy. Carotid stenosis measurements (when applicable) are obtained utilizing NASCET criteria, using the distal internal carotid diameter as the denominator. CONTRAST:  150m OMNIPAQUE IOHEXOL 350 MG/ML SOLN COMPARISON:  Prior CTA from 06/29/2018. FINDINGS: CT HEAD FINDINGS Brain: Cerebral volume stable, and within normal limits. No acute intracranial hemorrhage. No acute large vessel territory infarct. No mass lesion, midline shift or mass effect. No hydrocephalus. No extra-axial fluid collection. Vascular: No asymmetric hyperdense vessel. Skull: Scalp soft tissues demonstrate no acute finding. Calvarium intact. Sinuses: Air-fluid level noted within the dominant left sphenoid sinus. Paranasal sinuses are otherwise clear. Mastoid air cells and middle ear cavities are well pneumatized and free of fluid. Soft tissue density within the left EAC likely reflects cerumen. Orbits: Asymmetric soft tissue stranding seen involving the intraconal fat of the left orbit (series 5, image 118). Left lateral rectus muscle appears enlarged, new from previous. Globes remain symmetric in size with normal  morphology and appearance. Review of the MIP images confirms the above findings CTA NECK FINDINGS Aortic arch: Visualized aortic arch normal in caliber with normal 3 vessel morphology. Minimal plaque at the origin of the left subclavian artery. No flow-limiting stenosis about the origin of the great vessels. Subclavian arteries otherwise widely patent. Right carotid system: Right common carotid artery widely patent from its origin to the bifurcation without abnormality or stenosis. No atheromatous narrowing about the right carotid bifurcation. Multiple segments of ectasias again seen involving the mid-distal right ICA measuring up to 9 mm in maximal diameter, similar to previous. Superimposed saccular aneurysm at the distal right ICA  just proximal to the skull base relatively stable measuring 8 x 7 x 8 mm (transverse by craniocaudad by AP, series 12, image 111). No dissection or stenosis. Left carotid system: Left common carotid artery patent from its origin to the bifurcation without abnormality. No atheromatous narrowing about the left bifurcation. Aneurysmal dilatation of the distal left ICA up to 12 mm, relatively similar. No dissection or stenosis. Vertebral arteries: Both of the vertebral arteries arise from the subclavian arteries. Left vertebral artery dominant. No evidence for dissection or flow-limiting stenosis. Focal ectasia up at the level of C2 measuring up to 6 mm on the left and 5 mm on the right, stable. Skeleton: Mild multilevel cervical spondylolysis without significant spinal stenosis, stable. Other neck: No other acute soft tissue abnormality within the neck. Few subcentimeter hypodense thyroid nodules noted, of doubtful significance. Upper chest: Visualized upper chest demonstrates no acute finding. Review of the MIP images confirms the above findings CTA HEAD FINDINGS Anterior circulation: Petrous segments patent bilaterally. Cavernous/supraclinoid ICAs patent without flow-limiting stenosis or  definite abnormality. Adjacent venous contamination within the cavernous sinus limits evaluation. There is asymmetric engorgement and filling of the left superior orbital vein, raising the possibility for CC fistula (series 12, image 71). Additionally, there is asymmetric filling of the left inferior petrosal sinus. A1 segments patent bilaterally. Normal anterior communicating artery. Anterior cerebral arteries well perfused to their distal aspects. M1 segments patent bilaterally. Distal MCA branches well perfused and symmetric. Posterior circulation: Left vertebral artery ectatic measuring up to 5 mm, similar to previous. Right vertebral artery diffusely hypoplastic. Posterior inferior cerebral arteries patent proximally. Basilar patent to its distal aspect. Superior cerebral arteries perfuse bilaterally. Both of the PCAs patent to their distal aspects without stenosis. Venous sinuses: Concern for left-sided CC fistula or possibly dural AVF as above. Venous sinuses otherwise grossly patent allowing for timing of the contrast bolus. Anatomic variants: Tiny right posterior communicating artery noted. No other significant anatomic variant. No appreciable aneurysm. Delayed phase: No abnormal enhancement. Review of the MIP images confirms the above findings IMPRESSION: 1. Abnormal fat stranding involving the intraconal fat of the left orbit with associated extraocular muscle enlargement and engorgement of the left superior orbital vein. Findings concerning for possible cavernous carotid fistula or other arteriovenous shunt about the cavernous sinus. Further assessment with dedicated catheter directed arteriogram recommended for further evaluation. 2. Otherwise stable CTA with multiple segments of ectasia involving the carotid and vertebral arteries bilaterally. 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 Again, findings may reflect fibromuscular  dysplasia or underlying connective tissue disorder. 3. No large vessel occlusion, arterial dissection, or hemodynamically significant stenosis identified. 4. No other acute intracranial abnormality. Electronically Signed   By: Jeannine Boga M.D.   On: 10/20/2018 02:29   Ir Transcath/emboliz  Result Date: 10/25/2018 CLINICAL DATA:  Left-sided pulsatile tinnitus. Left-sided orbital chemosis, proptosis and ptosis. Abnormal CT angiogram of the head and neck. EXAM: IR ANGIO INTRA EXTRACRAN SEL INTERNAL CAROTID UNI LEFT MOD SED; ARTERIOGRAPHY; BILATERAL COMMON CAROTID AND INNOMINATE ANGIOGRAPHY; TRANSCATHETER THERAPY EMBOLIZATION COMPARISON:  CT angiogram of the head and neck of 10/20/2018. MEDICATIONS: Heparin 1000 units IV; no antibiotic was administered within 1 hour of the procedure. ANESTHESIA/SEDATION: Versed 1 mg IV; Fentanyl 25 mcg IV. Moderate Sedation Time:  30 minutes. The patient was continuously monitored during the procedure by the interventional radiology nurse under my direct supervision. CONTRAST:  Isovue 300 approximately 60 mL. FLUOROSCOPY TIME:  Fluoroscopy Time: 12 minutes 6 seconds (747 mGy). COMPLICATIONS: None immediate. TECHNIQUE: Informed written consent was obtained from the patient after a thorough discussion of the procedural risks, benefits and alternatives. All questions were addressed. Maximal Sterile Barrier Technique was utilized including caps, mask, sterile gowns, sterile gloves, sterile drape, hand hygiene and skin antiseptic. A timeout was performed prior to the initiation of the procedure. The right groin was prepped and draped in the usual sterile fashion. Thereafter using modified Seldinger technique, transfemoral access into the right common femoral artery was obtained without difficulty. Over a 0.035 inch guidewire, a 5 French Pinnacle sheath was inserted. Through this, and also over 0.035 inch guidewire, a 5 [place] JB 1 catheter was advanced to the aortic arch region and  selectively positioned in the right common carotid artery, the right subclavian artery, the left common carotid artery and the left vertebral artery. FINDINGS: Right subclavian arteriogram demonstrates a hypoplastic right vertebral artery. The vertebral artery origin is widely patent. The vessel is seen to opacify to the cranial skull base where it is seen to opacify the right posterior-inferior cerebellar artery and the right vertebrobasilar junction. Flow is also demonstrated into the distal basilar artery with mixing of unopacified blood from the more dominant contralateral left vertebral artery. The right common carotid arteriogram demonstrates the right external carotid artery and its major branches to be widely patent. The right internal carotid artery at the bulb is widely patent. No patency is seen of the proximal 1/3 of the right internal carotid artery. Distal to this there is a fusiform smooth caliber irregularity extending to the cervical petrous junction. At the level of the distal right internal carotid artery cervical segment there is an approximately 12.3 mm x 8.2 mm aneurysm projecting inferiorly and medially. Distal to this the right internal carotid artery demonstrates wide patency. The petrous, the cavernous and the supraclinoid segments are widely patent. The right middle cerebral artery and the right anterior cerebral artery opacify into the capillary and venous phases. There is a simultaneous cross-filling via the anterior communicating artery of the left anterior cerebral artery and the left middle cerebral artery into the capillary and venous phases. Unopacified blood is seen in the region of the left internal carotid terminus and proximal left middle cerebral artery. A left common carotid arteriogram demonstrates abnormally prominent left common carotid artery. The left external carotid artery and its major branches are widely patent. The left internal carotid artery at the bulb to the  level of the junction of the middle 1/3 of the left internal carotid artery is widely patent. Arising at the junction of the distal 1/3 and the middle 1/3 of the left internal carotid artery is a 16.2 mm x 11.5 mm saccular aneurysm projecting posteriorly. Distal to this the left internal carotid artery assumes a normal caliber to the petrous segment. At the level of the caval cavernous segment of the left internal carotid artery saccular pouch is noted with immediate egress of contrast posteriorly and also medially inferiorly and also crossing the midline across the intra cavernous tributaries opacifying the contralateral right cavernous sinus and subsequently the inferior petrosal sinus on the right side. Also demonstrated is the abnormally prominent superior petrosal sinus draining into the junction of the transverse and the left sigmoid sinus. There is spontaneous fast flow egress also inferiorly and laterally into the pterygoid plexus, and also the left inferior petrosal sinus. Also noted is abnormal prominence of the superior ophthalmic vein, and the inferior ophthalmic vein projecting  anteriorly and laterally and superiorly. Distal to the caval cavernous segment, the distal cavernous and the supraclinoid segments are widely patent. However, there is significant diminution of blood supply to the left middle cerebral artery. No visible opacification of the left anterior cerebral artery is noted from the left internal carotid artery injection. Origin of the dominant left vertebral artery is widely patent. The vessel is seen to opacify to the cranial skull base. At the level of the first horizontal segment there is a 6.1 mm x 4.2 mm saccular aneurysm. Distal to this the left vertebrobasilar junction and the left posterior-inferior cerebellar artery demonstrate wide patency. The basilar artery, the posterior cerebral arteries, the superior cerebellar arteries and the anterior-inferior cerebellar arteries opacify  into the capillary and venous phases. No retrograde opacification of the posterior communicating artery is seen from the left vertebral artery injection. 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 Extensive shunting of blood from the left internal carotid artery intracranially by the large fistulous communications. Cross-filling via the anterior communicating artery of the left middle cerebral artery and the left anterior cerebral artery from the right-sided internal carotid artery injection. 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 Approximately 12.3 mm x 8.2 mm saccular aneurysm arising in the posterior wall of the right internal carotid artery and distal cervical segment. PLAN: Findings reviewed with the patient and her sister. 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 Electronically Signed   By: Luanne Bras M.D.   On: 10/24/2018 15:04   Ir Angiogram Follow Up Study  Result Date: 10/25/2018 CLINICAL DATA:  Left-sided pulsatile tinnitus. Left-sided orbital chemosis, proptosis and ptosis. Abnormal CT angiogram of the head and neck. EXAM: IR ANGIO INTRA EXTRACRAN SEL INTERNAL CAROTID UNI LEFT MOD SED; ARTERIOGRAPHY; BILATERAL COMMON CAROTID AND INNOMINATE ANGIOGRAPHY; TRANSCATHETER THERAPY EMBOLIZATION COMPARISON:  CT angiogram of the head and neck of 10/20/2018. MEDICATIONS: Heparin 1000 units IV; no antibiotic was administered within 1 hour of the procedure.  ANESTHESIA/SEDATION: Versed 1 mg IV; Fentanyl 25 mcg IV. Moderate Sedation Time:  30 minutes. The patient was continuously monitored during the procedure by the interventional radiology nurse under my direct supervision. CONTRAST:  Isovue 300 approximately 60 mL. FLUOROSCOPY TIME:  Fluoroscopy Time: 12 minutes 6 seconds (747 mGy). COMPLICATIONS: None immediate. TECHNIQUE: Informed written consent was obtained from the patient after a thorough discussion of the procedural risks, benefits and alternatives. All questions were addressed. Maximal Sterile Barrier Technique was utilized including caps, mask, sterile gowns, sterile gloves, sterile drape, hand hygiene and skin antiseptic. A timeout was performed prior to the initiation of the procedure. The right groin was prepped and draped in the usual sterile fashion. Thereafter using modified Seldinger technique, transfemoral access into the right common femoral artery was obtained without difficulty. Over a 0.035 inch guidewire, a 5 French Pinnacle sheath was inserted. Through this, and also over 0.035 inch guidewire, a 5 [place] JB 1 catheter was advanced to the aortic arch region and selectively positioned in the right common carotid artery, the right  subclavian artery, the left common carotid artery and the left vertebral artery. FINDINGS: Right subclavian arteriogram demonstrates a hypoplastic right vertebral artery. The vertebral artery origin is widely patent. The vessel is seen to opacify to the cranial skull base where it is seen to opacify the right posterior-inferior cerebellar artery and the right vertebrobasilar junction. Flow is also demonstrated into the distal basilar artery with mixing of unopacified blood from the more dominant contralateral left vertebral artery. The right common carotid arteriogram demonstrates the right external carotid artery and its major branches to be widely patent. The right internal carotid artery at the bulb is widely patent.  No patency is seen of the proximal 1/3 of the right internal carotid artery. Distal to this there is a fusiform smooth caliber irregularity extending to the cervical petrous junction. At the level of the distal right internal carotid artery cervical segment there is an approximately 12.3 mm x 8.2 mm aneurysm projecting inferiorly and medially. Distal to this the right internal carotid artery demonstrates wide patency. The petrous, the cavernous and the supraclinoid segments are widely patent. The right middle cerebral artery and the right anterior cerebral artery opacify into the capillary and venous phases. There is a simultaneous cross-filling via the anterior communicating artery of the left anterior cerebral artery and the left middle cerebral artery into the capillary and venous phases. Unopacified blood is seen in the region of the left internal carotid terminus and proximal left middle cerebral artery. A left common carotid arteriogram demonstrates abnormally prominent left common carotid artery. The left external carotid artery and its major branches are widely patent. The left internal carotid artery at the bulb to the level of the junction of the middle 1/3 of the left internal carotid artery is widely patent. Arising at the junction of the distal 1/3 and the middle 1/3 of the left internal carotid artery is a 16.2 mm x 11.5 mm saccular aneurysm projecting posteriorly. Distal to this the left internal carotid artery assumes a normal caliber to the petrous segment. At the level of the caval cavernous segment of the left internal carotid artery saccular pouch is noted with immediate egress of contrast posteriorly and also medially inferiorly and also crossing the midline across the intra cavernous tributaries opacifying the contralateral right cavernous sinus and subsequently the inferior petrosal sinus on the right side. Also demonstrated is the abnormally prominent superior petrosal sinus draining into the  junction of the transverse and the left sigmoid sinus. There is spontaneous fast flow egress also inferiorly and laterally into the pterygoid plexus, and also the left inferior petrosal sinus. Also noted is abnormal prominence of the superior ophthalmic vein, and the inferior ophthalmic vein projecting anteriorly and laterally and superiorly. Distal to the caval cavernous segment, the distal cavernous and the supraclinoid segments are widely patent. However, there is significant diminution of blood supply to the left middle cerebral artery. No visible opacification of the left anterior cerebral artery is noted from the left internal carotid artery injection. Origin of the dominant left vertebral artery is widely patent. The vessel is seen to opacify to the cranial skull base. At the level of the first horizontal segment there is a 6.1 mm x 4.2 mm saccular aneurysm. Distal to this the left vertebrobasilar junction and the left posterior-inferior cerebellar artery demonstrate wide patency. The basilar artery, the posterior cerebral arteries, the superior cerebellar arteries and the anterior-inferior cerebellar arteries opacify into the capillary and venous phases. No retrograde opacification of the posterior communicating artery  is seen from the left vertebral artery injection. 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 Extensive shunting of blood from the left internal carotid artery intracranially by the large fistulous communications. Cross-filling via the anterior communicating artery of the left middle cerebral artery and the left anterior cerebral artery from the  right-sided internal carotid artery injection. 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 Approximately 12.3 mm x 8.2 mm saccular aneurysm arising in the posterior wall of the right internal carotid artery and distal cervical segment. PLAN: Findings reviewed with the patient and her sister. 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 Electronically Signed   By: Luanne Bras M.D.   On: 10/24/2018 15:04   Dg Chest Port 1 View  Result Date: 10/20/2018 CLINICAL DATA:  Hypercalcemia. EXAM: PORTABLE CHEST 1 VIEW COMPARISON:  CT chest and chest x-ray dated July 11, 2018. FINDINGS: The heart size and mediastinal contours are within normal limits. Normal pulmonary vascularity. Chronic pleural thickening at the bilateral costophrenic angles is unchanged. No focal consolidation, pleural effusion, or pneumothorax. No acute osseous abnormality. IMPRESSION: No active disease. Electronically Signed   By: Titus Dubin M.D.   On: 10/20/2018 05:55   Ir Angio Intra Extracran Sel Com Carotid Innominate Bilat Mod Sed  Result Date: 10/25/2018 CLINICAL DATA:  Left-sided pulsatile tinnitus. Left-sided orbital chemosis, proptosis and ptosis. Abnormal CT angiogram of the head and neck. EXAM: IR ANGIO INTRA EXTRACRAN SEL INTERNAL CAROTID UNI LEFT MOD SED; ARTERIOGRAPHY; BILATERAL COMMON CAROTID AND INNOMINATE ANGIOGRAPHY; TRANSCATHETER THERAPY EMBOLIZATION COMPARISON:  CT angiogram of the head and neck of 10/20/2018. MEDICATIONS: Heparin 1000 units IV; no antibiotic was administered within 1 hour of the procedure. ANESTHESIA/SEDATION: Versed 1 mg IV; Fentanyl 25 mcg IV. Moderate Sedation Time:  30 minutes. The patient was continuously monitored during the procedure by the interventional radiology nurse under my direct  supervision. CONTRAST:  Isovue 300 approximately 60 mL. FLUOROSCOPY TIME:  Fluoroscopy Time: 12 minutes 6 seconds (747 mGy). COMPLICATIONS: None immediate. TECHNIQUE: Informed written consent was obtained from the patient after a thorough discussion of the procedural risks, benefits and alternatives. All questions were addressed. Maximal Sterile Barrier Technique was utilized including caps, mask, sterile gowns, sterile gloves, sterile drape, hand hygiene and skin antiseptic. A timeout was performed prior to the initiation of the procedure. The right groin was prepped and draped in the usual sterile fashion. Thereafter using modified Seldinger technique, transfemoral access into the right common femoral artery was obtained without difficulty. Over a 0.035 inch guidewire, a 5 French Pinnacle sheath was inserted. Through this, and also over 0.035 inch guidewire, a 5 [place] JB 1 catheter was advanced to the aortic arch region and selectively positioned in the right common carotid artery, the right subclavian artery, the left common carotid artery and the left vertebral artery. FINDINGS: Right subclavian arteriogram demonstrates a hypoplastic right vertebral artery. The vertebral artery origin is widely patent. The vessel is seen to opacify to the cranial skull base where it is seen to opacify the right posterior-inferior  cerebellar artery and the right vertebrobasilar junction. Flow is also demonstrated into the distal basilar artery with mixing of unopacified blood from the more dominant contralateral left vertebral artery. The right common carotid arteriogram demonstrates the right external carotid artery and its major branches to be widely patent. The right internal carotid artery at the bulb is widely patent. No patency is seen of the proximal 1/3 of the right internal carotid artery. Distal to this there is a fusiform smooth caliber irregularity extending to the cervical petrous junction. At the level of the  distal right internal carotid artery cervical segment there is an approximately 12.3 mm x 8.2 mm aneurysm projecting inferiorly and medially. Distal to this the right internal carotid artery demonstrates wide patency. The petrous, the cavernous and the supraclinoid segments are widely patent. The right middle cerebral artery and the right anterior cerebral artery opacify into the capillary and venous phases. There is a simultaneous cross-filling via the anterior communicating artery of the left anterior cerebral artery and the left middle cerebral artery into the capillary and venous phases. Unopacified blood is seen in the region of the left internal carotid terminus and proximal left middle cerebral artery. A left common carotid arteriogram demonstrates abnormally prominent left common carotid artery. The left external carotid artery and its major branches are widely patent. The left internal carotid artery at the bulb to the level of the junction of the middle 1/3 of the left internal carotid artery is widely patent. Arising at the junction of the distal 1/3 and the middle 1/3 of the left internal carotid artery is a 16.2 mm x 11.5 mm saccular aneurysm projecting posteriorly. Distal to this the left internal carotid artery assumes a normal caliber to the petrous segment. At the level of the caval cavernous segment of the left internal carotid artery saccular pouch is noted with immediate egress of contrast posteriorly and also medially inferiorly and also crossing the midline across the intra cavernous tributaries opacifying the contralateral right cavernous sinus and subsequently the inferior petrosal sinus on the right side. Also demonstrated is the abnormally prominent superior petrosal sinus draining into the junction of the transverse and the left sigmoid sinus. There is spontaneous fast flow egress also inferiorly and laterally into the pterygoid plexus, and also the left inferior petrosal sinus. Also noted  is abnormal prominence of the superior ophthalmic vein, and the inferior ophthalmic vein projecting anteriorly and laterally and superiorly. Distal to the caval cavernous segment, the distal cavernous and the supraclinoid segments are widely patent. However, there is significant diminution of blood supply to the left middle cerebral artery. No visible opacification of the left anterior cerebral artery is noted from the left internal carotid artery injection. Origin of the dominant left vertebral artery is widely patent. The vessel is seen to opacify to the cranial skull base. At the level of the first horizontal segment there is a 6.1 mm x 4.2 mm saccular aneurysm. Distal to this the left vertebrobasilar junction and the left posterior-inferior cerebellar artery demonstrate wide patency. The basilar artery, the posterior cerebral arteries, the superior cerebellar arteries and the anterior-inferior cerebellar arteries opacify into the capillary and venous phases. No retrograde opacification of the posterior communicating artery is seen from the left vertebral artery injection. 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 Extensive shunting of blood from the left internal carotid artery intracranially by the large fistulous communications. Cross-filling via the anterior communicating artery of the left middle cerebral artery and the left anterior cerebral artery from the right-sided internal carotid artery injection. 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  Approximately 12.3 mm x 8.2 mm saccular aneurysm arising in the posterior wall of the right internal carotid artery and distal cervical segment. PLAN: Findings reviewed with the patient and her sister. 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 Electronically Signed   By: Luanne Bras M.D.   On: 10/24/2018 15:04   Ir Angio Intra Extracran Sel Internal Carotid Uni L Mod Sed  Result Date: 10/25/2018 CLINICAL DATA:  Left-sided pulsatile tinnitus. Left-sided orbital chemosis, proptosis and ptosis. Abnormal CT angiogram of the head and neck. EXAM: IR ANGIO INTRA EXTRACRAN SEL INTERNAL CAROTID UNI LEFT MOD SED; ARTERIOGRAPHY; BILATERAL COMMON CAROTID AND INNOMINATE ANGIOGRAPHY; TRANSCATHETER THERAPY EMBOLIZATION COMPARISON:  CT angiogram of the head and neck of 10/20/2018. MEDICATIONS: Heparin 1000 units IV; no antibiotic was administered within 1 hour of the procedure. ANESTHESIA/SEDATION: Versed 1 mg IV; Fentanyl 25 mcg IV. Moderate Sedation Time:  30 minutes. The patient was continuously monitored during the procedure by the interventional radiology nurse under my direct supervision. CONTRAST:  Isovue 300 approximately 60 mL. FLUOROSCOPY TIME:  Fluoroscopy Time: 12 minutes 6 seconds (747 mGy). COMPLICATIONS: None immediate. TECHNIQUE: Informed written consent was obtained from the patient after a thorough discussion of the procedural risks, benefits and alternatives. All questions were addressed. Maximal Sterile Barrier Technique was utilized including caps, mask, sterile gowns, sterile gloves, sterile drape, hand hygiene and skin antiseptic. A timeout was performed prior to the initiation of the procedure. The right groin was prepped and draped in the usual sterile fashion. Thereafter using modified Seldinger technique, transfemoral access into the right common femoral artery was obtained without difficulty. Over a 0.035  inch guidewire, a 5 French Pinnacle sheath was inserted. Through this, and also over 0.035 inch guidewire, a 5 [place] JB 1 catheter was advanced to the aortic arch region and selectively positioned in the right common carotid artery, the right subclavian artery, the left common carotid artery and the left vertebral artery. FINDINGS: Right subclavian arteriogram demonstrates a hypoplastic right vertebral artery. The vertebral artery origin is widely patent. The vessel is seen to opacify to the cranial skull base where it is seen to opacify the right posterior-inferior cerebellar artery and the right vertebrobasilar junction. Flow is also demonstrated into the distal basilar artery with mixing of unopacified blood from the more dominant contralateral left vertebral artery. The right common carotid arteriogram demonstrates the right external carotid artery and its major branches to be widely patent. The right internal carotid artery at the bulb is widely patent. No patency is seen of the proximal 1/3 of the right internal carotid artery. Distal to this there is a fusiform smooth caliber irregularity extending to the cervical petrous junction. At the level of the distal right internal carotid artery cervical segment there is an approximately 12.3 mm x 8.2 mm aneurysm projecting inferiorly and medially. Distal to this the right internal carotid artery demonstrates wide patency. The petrous, the cavernous and the supraclinoid  segments are widely patent. The right middle cerebral artery and the right anterior cerebral artery opacify into the capillary and venous phases. There is a simultaneous cross-filling via the anterior communicating artery of the left anterior cerebral artery and the left middle cerebral artery into the capillary and venous phases. Unopacified blood is seen in the region of the left internal carotid terminus and proximal left middle cerebral artery. A left common carotid arteriogram demonstrates  abnormally prominent left common carotid artery. The left external carotid artery and its major branches are widely patent. The left internal carotid artery at the bulb to the level of the junction of the middle 1/3 of the left internal carotid artery is widely patent. Arising at the junction of the distal 1/3 and the middle 1/3 of the left internal carotid artery is a 16.2 mm x 11.5 mm saccular aneurysm projecting posteriorly. Distal to this the left internal carotid artery assumes a normal caliber to the petrous segment. At the level of the caval cavernous segment of the left internal carotid artery saccular pouch is noted with immediate egress of contrast posteriorly and also medially inferiorly and also crossing the midline across the intra cavernous tributaries opacifying the contralateral right cavernous sinus and subsequently the inferior petrosal sinus on the right side. Also demonstrated is the abnormally prominent superior petrosal sinus draining into the junction of the transverse and the left sigmoid sinus. There is spontaneous fast flow egress also inferiorly and laterally into the pterygoid plexus, and also the left inferior petrosal sinus. Also noted is abnormal prominence of the superior ophthalmic vein, and the inferior ophthalmic vein projecting anteriorly and laterally and superiorly. Distal to the caval cavernous segment, the distal cavernous and the supraclinoid segments are widely patent. However, there is significant diminution of blood supply to the left middle cerebral artery. No visible opacification of the left anterior cerebral artery is noted from the left internal carotid artery injection. Origin of the dominant left vertebral artery is widely patent. The vessel is seen to opacify to the cranial skull base. At the level of the first horizontal segment there is a 6.1 mm x 4.2 mm saccular aneurysm. Distal to this the left vertebrobasilar junction and the left posterior-inferior cerebellar  artery demonstrate wide patency. The basilar artery, the posterior cerebral arteries, the superior cerebellar arteries and the anterior-inferior cerebellar arteries opacify into the capillary and venous phases. No retrograde opacification of the posterior communicating artery is seen from the left vertebral artery injection. 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 Extensive shunting of blood from the left internal carotid artery intracranially by the large fistulous communications. Cross-filling via the anterior communicating artery of the left middle cerebral artery and the left anterior cerebral artery from the right-sided internal carotid artery injection. 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 Approximately 12.3 mm x 8.2 mm saccular aneurysm arising in the posterior wall of the right internal carotid artery and distal cervical segment. PLAN: Findings reviewed with the patient and her sister. There were discussions regarding the management of the large high-flow left internal carotid artery direct  carotid cavernous fistula. Electronically Signed   By: Luanne Bras M.D.   On: 10/24/2018 15:04   Ir Angio Vertebral Sel Vertebral Uni L Mod Sed  Result Date: 10/25/2018 INDICATION: Symptomatic left internal carotid artery direct CC fistula. EXAM: ENDOVASCULAR STAGED EMBOLIZATION OF LEFT INTERNAL CAROTID ARTERY [facility] DIRECT FISTULA. COMPARISON:  Diagnostic catheter arteriogram of 10/20/2018. MEDICATIONS: Ancef 2 g IV was administered within 1 hour of  the procedure. ANESTHESIA/SEDATION: General anesthesia. CONTRAST:  Isovue 300 approximately 120 mL. FLUOROSCOPY TIME:  Fluoroscopy Time: 124 minutes 12 seconds (3473 mGy). COMPLICATIONS: None immediate. TECHNIQUE: Informed written consent was obtained from the patient after a thorough discussion of the procedural risks, benefits and alternatives. All questions were addressed. Maximal Sterile Barrier Technique was utilized including caps, mask, sterile gowns, sterile gloves, sterile drape, hand hygiene and skin antiseptic. A timeout was performed prior to the initiation of the procedure. The patient was then put under general anesthesia by the Department of Anesthesiology. The right groin was prepped and draped in the usual sterile fashion. Therefore, using modified Seldinger technique trans femoral access in the right common femoral artery was obtained without difficulty. Over a 0.035 inch guidewire a 5 French Pinnacle sheath was inserted. Over a 0.035 inch guidewire and a 5 [place] JB 1 catheter was advanced to the aortic arch region and selectively positioned in the right common carotid artery and the left common carotid artery. FINDINGS: Right common carotid arteriogram again demonstrates the right external carotid artery and their branches to be widely patent. 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 The petrous, the cavernous and the supraclinoid segments are widely patent. The right middle cerebral artery and the right anterior cerebral artery opacify into the capillary and venous phases. There is prompt filling of the anterior communicating artery of the left anterior cerebral artery, and the left middle cerebral artery into the capillary and venous phases. The left common carotid arteriogram demonstrates the left external carotid artery and its major branches to be widely patent. The left internal carotid at the bulb again to the  cranial skull base demonstrates wide patency with the large laterally positioned aneurysm unchanged at the level of the junction of the distal and the middle 1/3 of the left internal carotid artery. Distal to this the patency of the left internal carotid artery in the petrous segment is noted. In the caval cavernous segment again demonstrated is the large fast flow shunting of blood into the left transverse sinus and via the intra cavernous tributaries into the right cavernous sinus and the right inferior petrosal sinus. Fast flow is noted into the enlarged superior petrosal sinus, the left superior ophthalmic vein, the inferior ophthalmic vein, the pterygoid plexus and the left inferior petrosal sinus. Partial flow is noted into the normal appearing distal cavernous and the left supraclinoid segments. Partial opacification is noted of the left middle cerebral artery branches with mixing of unopacified blood from the contralateral right internal carotid artery via the anterior communicating artery. PROCEDURE: The diagnostic JB 1 catheter in the left common carotid artery was exchanged over a 0.035 inch 300 cm Rosen exchange guidewire for an 85 cm 8 [place] Neuron Max guide catheter. The guidewire was removed. Good aspiration obtained from the hub of the Neuron Max sheath. Gentle control arteriogram demonstrated no evidence of spasms, dissections or of intraluminal filling defects. This was then connected to continuous heparinized saline infusion. Over a 0.035 inch Roadrunner guidewire, using  biplane roadmap technique and constant fluoroscopic guidance, a 7 French 132 cm Catalyst guide catheter was then advanced without difficulty into the horizontal petrous segment of the left internal carotid artery, the guidewire was removed. Good aspiration obtained from the hub of the 7 [place] Catalyst guide catheter. A control arteriogram performed through this again demonstrated the large fast flow left internal carotid artery  directed CC fistula. Using biplane roadmap technique and constant fluoroscopic guidance over a 0.014 inch Softip Synchro micro guidewire, a 4 mm x 11 mm extra compliant Scepter balloon which had been prepped with evacuation of air from the balloon, was advanced and positioned with its distal marker into the distal cavernous segment of the left internal carotid artery. This was then left with the micro guidewire in the supraclinoid left ICA. Through a second by bore of a double Tuohy Borst, over a 0.014 inch Asahi micro guidewire, a 014 Echelon microcatheter was then advanced into the proximal cavernous segment of the left internal carotid artery. Using a torque device, the micro guidewire was then gently manipulated and access was obtained into the posterior aspect of the left cavernous sinus at the origin of the superior petrosal sinus. The guidewire was removed. Good aspiration was obtained from the hub of the Echelon microcatheter. A gentle control arteriogram performed demonstrated intra cavernous positioning of the tip of the microcatheter. Thereafter using intermittent biplane roadmap technique and constant fluoroscopic guidance, the following coils were then advanced into the cavernous sinus. A 7 mm x 20 cm Target XL coil, a 7 mm x 15 cm Target 360 soft coil, a 6 mm x 20 cm Target XL 360 soft coil, a 6 mm x 20 cm Target 360 soft coil, a 6 mm x 15 cm Target 360 soft coil, a 6 mm x 20 cm 360 standard Target coil, a Codman neuro stretch resistant 6 mm x 15 cm coil, and a 5 mm x 15 cm Target 360 soft coil. Each coil was advanced into the cavernous sinus using biplane roadmap technique and constant fluoroscopic guidance. Prior to the detachment of each coil, a control arteriogram was performed ensuring safe positioning of the coil. Also each coil was advanced into the cavernous sinus with the Scepter C balloon inflated with 50% contrast and 50% heparinized saline infusion using a 1 mL syringe. Following the final  coil, placement of subsequent coils was met with the coils extending into the distal cavernous segment of the left internal carotid artery. A control arteriogram performed through the Neuron Max sheath in the left internal carotid artery continued to demonstrate egress through the pterygoid plexus on the left side and right side, into the right cavernous sinus via the intra cavernous tributaries, and also the superior and the inferior superior ophthalmic veins. There continued to be flow into the left internal carotid artery. Access into the supraclinoid left ICA and the left middle cerebral artery was then obtained using a Headway 17 2 tip microcatheter over a 0.014 inch standard Synchro micro guidewire. It was decided to place a 4.5 mm x 30 mm Atlas Neuroform stent in order to provide pathway for antegrade flow into the internal carotid artery intracranially. Also to provide a platform for reconstruction and possible further endovascular coiling in order to treat and eliminate or significant slow down flow through the left internal carotid artery direct CC fistula. This stent was then deployed without difficulty with stable anchoring in the supraclinoid left ICA, and also the petrous cavernous segment of the left internal  carotid artery. A final control arteriogram performed through the 6 [place] Catalyst guide catheter in the horizontal petrous segment demonstrated significant reduced flow through the posterior half of the left cavernous sinus, and also the superolateral aspect of the left cavernous sinus. There continued to be significant shunting of blood into the bilateral inferior petrosal sinuses, the left pterygoid plexus and the left superior and inferior ophthalmic veins. Catalyst guide catheter, and the Neuron Max sheath were then retrieved into the abdominal aorta and removed with hemostasis obtained in the right groin puncture site with manual compression. The right groin appeared soft without evidence  of a hematoma or bleeding. Distal pulses remained palpable in the dorsalis pedis, and the posterior tibial regions bilaterally at the end of the procedure. Throughout the procedure, the patient's blood pressure and neurological status remained stable. Patient was then extubated without difficulty. Upon recovery, the patient demonstrated no new neurological signs or symptoms. She continued to have chemosis with sluggish reaction of the left pupil. 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 Otherwise, neurologically, the patient remained stable without complaints of headaches, nausea or vomiting. She was then transferred to the neuro ICU to continue on low-dose IV heparin. She was to continue with regular aspirin for the time being. 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 PLAN: Contemplate scheduling the second stage of the embolization in next 1-2 weeks depending on the patient's clinical condition. This was discussed with the patient, and the patient's sister. Electronically Signed   By: Luanne Bras M.D.   On: 10/24/2018 15:47    Labs:  CBC: Recent Labs    10/21/18 0401 10/23/18 0613 10/24/18 0425 10/24/18 1230 10/25/18 1011  WBC 14.9* 12.8* 12.5*  --  18.0*  HGB 12.2 13.2 9.4* 9.4* 10.8*  HCT 35.7* 38.6 28.3* 28.3* 32.2*  PLT 271 291 219  --  222    COAGS: Recent Labs    10/22/18 0923  INR 1.0    BMP: Recent Labs    10/22/18 0514 10/23/18 0613 10/24/18 0425 10/25/18 0334  NA 141 140 140 140  K 3.0* 3.7 3.3* 2.9*  CL 107 107 109 107  CO2 24 22 17* 23  GLUCOSE 89 94 106* 106*  BUN 11 13 13 9   CALCIUM 9.0 9.3 8.0* 8.1*  CREATININE 0.86 0.76 0.76 0.80    GFRNONAA >60 >60 >60 >60  GFRAA >60 >60 >60 >60    LIVER FUNCTION TESTS: Recent Labs    11/28/17 0434 11/29/17 0446 07/11/18 1300 10/19/18 1837  BILITOT 0.9 0.7 1.5* 1.4*  AST 24 20 45* 25  ALT 18 17 13 12   ALKPHOS 55 46 49 57  PROT 7.5 6.5 7.6 8.6*  ALBUMIN 4.2 3.7 4.3 5.2*     Assessment and Plan:  Left ICA cavernous fistula. Plan for image-guided cerebral arteriogram with possible staged embolization of left ICA cavernous fistula/aneurysm tentatively for Friday 10/26/2018 with Dr. Estanislado Pandy. Patient will be NPO at midnight. Afebrile, WBCs elevated to 18.0 today- will check CBC with differential in AM prior to proceeding with procedure. Ok to proceed with Plavix and Aspirin use per Dr. Estanislado Pandy- load with 300 mg Plavix today, then begin Plavix 75 mg QD 5/8/2020Merrily Pew, RN aware. INR 1.0 10/22/2018.  P2Y12 pending for 0500 tomorrow, will check prior to procedure.  Risks and benefits of cerebral arteriogram with intervention were discussed with the patient including, but not limited to bleeding, infection, vascular injury, contrast induced renal failure, stroke or even death. This interventional procedure involves the use of X-rays and because of the nature of the planned procedure, it is possible that we will have prolonged use of X-ray fluoroscopy. Potential radiation risks to you include (but are not limited to) the following: - A slightly elevated risk for cancer several years later in life. This risk is typically less than 0.5% percent. This risk is low in comparison to the normal incidence of human cancer, which is 33% for women and 50% for men according to the [place]. - Radiation induced injury can include skin redness, resembling a rash, tissue breakdown / ulcers and hair loss (which can be temporary or permanent).  The likelihood of either of these occurring depends on the difficulty of the procedure and whether you are sensitive to radiation due to previous  procedures, disease, or genetic conditions.  IF your procedure requires a prolonged use of radiation, you will be notified and given written instructions for further action.  It is your responsibility to monitor the irradiated area for the 2 weeks following the procedure and to notify your physician if you are concerned that you have suffered a radiation induced injury.   All of the patient's questions were answered, patient is agreeable to proceed. Consent signed and in chart.   Thank you for this interesting consult.  I greatly enjoyed meeting Minette D Featherston and look forward to participating in their care.  A copy of this report was sent to the requesting provider on this date.  Electronically Signed: Earley Abide, PA-C 10/25/2018, 2:13 PM   I spent a total of 16 Miinutes in face to face in clinical consultation, greater than 50% of which was counseling/coordinating care for left ICA cavernous fistula/staged embolization.

## 2018-10-26 ENCOUNTER — Inpatient Hospital Stay (HOSPITAL_COMMUNITY): Payer: 59

## 2018-10-26 ENCOUNTER — Encounter (HOSPITAL_COMMUNITY): Payer: Self-pay | Admitting: Registered Nurse

## 2018-10-26 ENCOUNTER — Encounter (HOSPITAL_COMMUNITY)
Admission: EM | Disposition: A | Discharge status: Home / Self Care | Payer: Self-pay | Source: Home / Self Care | Attending: Internal Medicine

## 2018-10-26 ENCOUNTER — Other Ambulatory Visit (HOSPITAL_COMMUNITY): Payer: 59

## 2018-10-26 DIAGNOSIS — M7989 Other specified soft tissue disorders: Secondary | ICD-10-CM

## 2018-10-26 LAB — CBC WITH DIFFERENTIAL/PLATELET
Abs Immature Granulocytes: 0.05 10*3/uL (ref 0.00–0.07)
Basophils Absolute: 0 10*3/uL (ref 0.0–0.1)
Basophils Relative: 0 %
Eosinophils Absolute: 0.1 10*3/uL (ref 0.0–0.5)
Eosinophils Relative: 1 %
HCT: 29.8 % — ABNORMAL LOW (ref 36.0–46.0)
Hemoglobin: 10.1 g/dL — ABNORMAL LOW (ref 12.0–15.0)
Immature Granulocytes: 0 %
Lymphocytes Relative: 16 %
Lymphs Abs: 2.3 10*3/uL (ref 0.7–4.0)
MCH: 29.8 pg (ref 26.0–34.0)
MCHC: 33.9 g/dL (ref 30.0–36.0)
MCV: 87.9 fL (ref 80.0–100.0)
Monocytes Absolute: 1.2 10*3/uL — ABNORMAL HIGH (ref 0.1–1.0)
Monocytes Relative: 8 %
Neutro Abs: 11 10*3/uL — ABNORMAL HIGH (ref 1.7–7.7)
Neutrophils Relative %: 75 %
Platelets: 209 10*3/uL (ref 150–400)
RBC: 3.39 MIL/uL — ABNORMAL LOW (ref 3.87–5.11)
RDW: 13.2 % (ref 11.5–15.5)
WBC: 14.7 10*3/uL — ABNORMAL HIGH (ref 4.0–10.5)
nRBC: 0 % (ref 0.0–0.2)

## 2018-10-26 LAB — HEPATIC FUNCTION PANEL
ALT: 32 U/L (ref 0–44)
AST: 35 U/L (ref 15–41)
Albumin: 4.1 g/dL (ref 3.5–5.0)
Alkaline Phosphatase: 49 U/L (ref 38–126)
Bilirubin, Direct: 0.2 mg/dL (ref 0.0–0.2)
Indirect Bilirubin: 1.2 mg/dL — ABNORMAL HIGH (ref 0.3–0.9)
Total Bilirubin: 1.4 mg/dL — ABNORMAL HIGH (ref 0.3–1.2)
Total Protein: 7.6 g/dL (ref 6.5–8.1)

## 2018-10-26 LAB — LIPASE, BLOOD: Lipase: 22 U/L (ref 11–51)

## 2018-10-26 LAB — LACTIC ACID, PLASMA: Lactic Acid, Venous: 2.9 mmol/L (ref 0.5–1.9)

## 2018-10-26 LAB — BASIC METABOLIC PANEL
Anion gap: 12 (ref 5–15)
BUN: 5 mg/dL — ABNORMAL LOW (ref 6–20)
CO2: 25 mmol/L (ref 22–32)
Calcium: 8.6 mg/dL — ABNORMAL LOW (ref 8.9–10.3)
Chloride: 103 mmol/L (ref 98–111)
Creatinine, Ser: 0.62 mg/dL (ref 0.44–1.00)
GFR calc Af Amer: >60 mL/min (ref >60)
GFR calc non Af Amer: >60 mL/min (ref >60)
Glucose, Bld: 105 mg/dL — ABNORMAL HIGH (ref 70–99)
Potassium: 3.5 mmol/L (ref 3.5–5.1)
Sodium: 140 mmol/L (ref 135–145)

## 2018-10-26 LAB — PLATELET INHIBITION P2Y12: Platelet Function  P2Y12: 150 [PRU] — ABNORMAL LOW (ref 182–335)

## 2018-10-26 LAB — AMYLASE: Amylase: 61 U/L (ref 28–100)

## 2018-10-26 SURGERY — IR WITH ANESTHESIA
Anesthesia: General

## 2018-10-26 MED ORDER — IOHEXOL 350 MG/ML SOLN
75.0000 mL | Freq: Once | INTRAVENOUS | Status: AC | PRN
  Administered 2018-10-26: 10:00:00 75 mL via INTRAVENOUS

## 2018-10-26 MED ORDER — OMEPRAZOLE 2 MG/ML ORAL SUSPENSION: 40.0000 mg | Freq: Every day | ORAL | Status: DC

## 2018-10-26 MED ORDER — SODIUM CHLORIDE 0.9 % IV BOLUS
1000.0000 mL | Freq: Once | INTRAVENOUS | Status: AC
  Administered 2018-10-26: 1000 mL via INTRAVENOUS

## 2018-10-26 MED ORDER — PROPOFOL 500 MG/50ML IV EMUL
INTRAVENOUS | Status: AC
  Filled 2018-10-26: qty 100

## 2018-10-26 MED ORDER — CLEVIDIPINE BUTYRATE 0.5 MG/ML IV EMUL
2.0000 mg/h | INTRAVENOUS | Status: DC
  Administered 2018-10-26: 2 mg/h via INTRAVENOUS
  Filled 2018-10-26 (×2): qty 50

## 2018-10-26 MED ORDER — ARTIFICIAL TEARS OPHTHALMIC OINT
TOPICAL_OINTMENT | Freq: Every day | OPHTHALMIC | Status: AC
  Administered 2018-10-26 – 2018-10-27 (×2): via OPHTHALMIC
  Administered 2018-10-28 – 2018-10-30 (×3): 1 via OPHTHALMIC
  Administered 2018-10-31 – 2018-11-01 (×2): via OPHTHALMIC
  Filled 2018-10-26: qty 3.5

## 2018-10-26 MED ORDER — BRIMONIDINE TARTRATE 0.2 % OP SOLN
1.0000 [drp] | Freq: Two times a day (BID) | OPHTHALMIC | Status: DC
  Administered 2018-10-26 – 2018-11-01 (×11): 1 [drp] via OPHTHALMIC
  Filled 2018-10-26: qty 5

## 2018-10-26 MED ORDER — POLYVINYL ALCOHOL 1.4 % OP SOLN
1.0000 [drp] | Freq: Four times a day (QID) | OPHTHALMIC | Status: DC
  Administered 2018-10-26 – 2018-11-02 (×24): 1 [drp] via OPHTHALMIC
  Filled 2018-10-26: qty 15

## 2018-10-26 MED ORDER — CALCIUM CARBONATE ANTACID 500 MG PO CHEW
1.0000 | CHEWABLE_TABLET | Freq: Two times a day (BID) | ORAL | Status: DC
  Administered 2018-10-26 – 2018-11-02 (×10): 200 mg via ORAL
  Filled 2018-10-26 (×10): qty 1

## 2018-10-26 NOTE — Progress Notes (Addendum)
Deveshwar MD informed of patient having two events of nausea/vomiting, tachycardia (XT056) and elevated BP trends (SBP 170s/DBP110s). Patient complaints of head feeling "whoozy" with paraesthesias in right hand. Orders to start Cleviprex infusion, call Code Stroke and obtain STAT Head CT.

## 2018-10-26 NOTE — Progress Notes (Signed)
Per discussion with Oddono MD, Dr. Chris Weaver (opthamology) paged regarding additional management of pt's left eye swelling, irritation and pain. 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 

## 2018-10-26 NOTE — Progress Notes (Signed)
Per Dr. Corliss Skains, surgery cancelled for today.  Pt needs head CT.  Pt taken down to radiology.  From radiology pt will be transferred back to the floor.  [name] is following pt as pt is on Cleviprex infusion.   Report called to the nurse on the floor.

## 2018-10-26 NOTE — Progress Notes (Signed)
LUE venous duplex       has been completed. Preliminary results can be found under CV proc through chart review. Dareld Mcauliffe, BS, RDMS, RVT   

## 2018-10-26 NOTE — Progress Notes (Addendum)
Patient ID: [name], female   DOB: 1970/09/14, 48 y.o.   MRN: [id number]    Was scheduled for staged embolization of left ICA cavernous fistula in IR today Dr Corliss Skains was called to bedside in OR Holding --- Rt hand numbness and nausea  He has examined pt She states her nausea is intermittent and is not new Rt hand numbness does also seem to come and go Tingling throughout hand  Exam:  Rt pupil 2 mm and reactive             Lt pupil 4-5 mm - slow to react Chemosis and proptosis no change Left eye lat gaze diplopia Extremities-- good ROM; strength = Rt hand no weakness  Dr Corliss Skains will order stat CT Head Will ask Dr Blake Divine to work up pt for possible infection source Continue Cleviprex  HOLD procedure today Plan for possible Monday 5/11 Anesthesia aware

## 2018-10-26 NOTE — Progress Notes (Signed)
Attempted to call sister Rayetta Pigg, to notify her that pt has gone down for procedure. No answer.

## 2018-10-26 NOTE — Progress Notes (Signed)
Pt taken down to SS #[id number] by me. Plavix 75 mg given, ancef hanging. Pt became nauseus, 12.5 mg phenergan given. Pt placed on monitor and bedside handoff given to CRNA. (Report previously called to [facility], RN).

## 2018-10-26 NOTE — Progress Notes (Signed)
Received call from Short Stay. They stated procedure has been postponed for another day secondary to pts nausea, h/a, pulse rate, BP,  parasthesias and elevated WBCs. They stated a code stroke was called then subsequently cancelled by Dr Corliss Skains.

## 2018-10-26 NOTE — Progress Notes (Signed)
Per discussion with Oddono MD, Dr. Baker Pierini (opthamology) paged regarding additional management of pt's left eye swelling, irritation and pain. 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

## 2018-10-26 NOTE — Progress Notes (Signed)
Deveshwar MD present in room, evaluated patient's symptoms, orders to cancel code stroke, to still obtain head CT and keep SBP via Cleviprex infusion with SBP goals of 120-140s range. Procedure cancelled per MD.

## 2018-10-26 NOTE — Consult Note (Signed)
CC:  Chief Complaint  Patient presents with  . Emesis    HPI: [name] is a 48 y.o. 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 She presented to [facility] ED on 10/19/2018 with left eye pain and tinnitus. CTA revealed new finding of left ICA cavernous fistula. NIR was consulted and she had a cerebral arteriogram 11/07/18 which confirmed large left ICA-cavernous fistula.   Today, they were going to proceed with embolization, but due to right sided parathesias and numbness, this was postponed. Given the delay for embolization, ophthalmology was consulted to help manage left eye discomfort.  Patient states she has left eye burning, inability to close OS, stinging, and occasional pains. Vision blurry out of OS and has double vision in left gaze (most notable).   ROS: Denies fever/chills, unintentional weight loss, chest pain, irregular heart rhythm, SOB, cough, wheezing, abdominal pain, melena, hematochezia, weakness, numbness, slurring of speech, facial droop, muscle weakness, joint pain, skin rash, tattoos, depressed mood  PMH: Past Medical History:  Diagnosis Date  . Allergic rhinitis   . Anxiety   . B12 deficiency   . Barrett's esophagus   . Chronic constipation   . 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 Depression   . GERD (gastroesophageal reflux disease)   . Hiatal hernia   . History of DVT of lower extremity    08-14-2003  RIGHT LOWER EXTREMITY-- TREATED W/ COUMADIN----  per pt none since  . History of palpitations   . Hypertension   . Idiopathic chronic gout    12-20-2017  per pt stable ,   last episode 02/ 2019  . Iron deficiency anemia   . Migraines   . Personal history of PE (pulmonary embolism)    03/ 2005 RIGHT SIDE  --- treated w/ coumadin---  per pt none since  . PONV (postoperative nausea and vomiting)   . Tinea versicolor   . Vitamin D deficiency     PSH: Past Surgical  History:  Procedure Laterality Date  . BIOPSY  11/29/2017   Procedure: BIOPSY;  Surgeon: [name], MD;  Location: [facility];  Service: Endoscopy;;  . BRONCHOSCOPY  05-20-2004  dr clance  . ESOPHAGOGASTRODUODENOSCOPY (EGD) WITH PROPOFOL N/A 11/29/2017   Procedure: ESOPHAGOGASTRODUODENOSCOPY (EGD) WITH PROPOFOL;  Surgeon: [name], MD;  Location: [facility];  Service: Endoscopy;  Laterality: N/A;  . EXPLORATORY LAPAROTOMY  AGE 16    [facility]   for constipation  . INSERTION OF MESH N/A 12/26/2017   Procedure: INSERTION OF BIO A MESH;  Surgeon: [name], MD;  Location: [facility];  Service: General;  Laterality: N/A;  . IR ANGIO INTRA EXTRACRAN SEL COM CAROTID INNOMINATE BILAT MOD SED  10/20/2018  . IR ANGIO INTRA EXTRACRAN SEL INTERNAL CAROTID UNI L MOD SED  10/23/2018  . IR ANGIO VERTEBRAL SEL SUBCLAVIAN INNOMINATE UNI R MOD SED  10/20/2018  . IR ANGIO VERTEBRAL SEL VERTEBRAL UNI L MOD SED  10/20/2018  . IR ANGIOGRAM FOLLOW UP STUDY  10/23/2018  . IR TRANSCATH/EMBOLIZ  10/23/2018  . KNEE ARTHROSCOPY W/ ACL RECONSTRUCTION Right 09/2003  . LAPAROSCOPY BILATERAL TUBAL FULGERATION AND ATTEMPTED THERMACHOICE ABLATION WITH UTERINE PERFORATION  07-17-2009   dr [name]  [facility]  . LUNG LOBECTOMY  1991   right lower lobe for massive hemoptyosis  . MINI-THORACOTOMY WEDGE RESECTION OF LEFT LOWER LOBE CYST Left 06-24-2004   dr Edwyna Shell [facility]  hemoptyosis  . RADIOLOGY WITH ANESTHESIA N/A 10/23/2018   Procedure: IR WITH ANESTHESIA FOR EMBOLIZATION;  Surgeon: [name], [name], MD;  Location: [facility] OR;  Service: Radiology;  Laterality: N/A;    Meds: No current facility-administered medications on file prior to encounter.    Current Outpatient Medications on File Prior to Encounter  Medication Sig Dispense Refill  . allopurinol (ZYLOPRIM) 100 MG tablet Take 1 tablet (100 mg total) by mouth daily. Must keep visit on 11/03/17 to get refills (Patient taking differently: Take 100 mg by mouth daily as needed (for gout  flare up). Must keep visit on 11/03/17 to get refills) 90 tablet 3  . aspirin-acetaminophen-caffeine (EXCEDRIN MIGRAINE) 250-250-65 MG tablet Take 1 tablet by mouth every 6 (six) hours as needed for headache or migraine.     . cetirizine (ZYRTEC) 10 MG tablet Take 10 mg by mouth daily as needed for allergies.    . Cholecalciferol (VITAMIN D3) 2000 units TABS Take 2,000 Units by mouth daily.     . diazepam (VALIUM) 5 MG tablet TAKE 1 TABLET BY MOUTH EVERY 12 HOURS IF NEEDED FOR ANXIETY (Patient taking differently: Take 5 mg by mouth every 12 (twelve) hours as needed for anxiety. take 1 tablet by mouth every 12 hours if needed for anxiety) 60 tablet 0  . ferrous gluconate (IRON 27) 240 (27 FE) MG tablet Take 240 mg by mouth daily.     . hydrochlorothiazide (HYDRODIURIL) 25 MG tablet Take 25 mg by mouth daily.     . Multiple Vitamins-Minerals (MULTIVITAMIN ADULTS PO) Take 1 tablet by mouth daily.     . potassium chloride SA (K-DUR,KLOR-CON) 20 MEQ tablet Take 2 tablets (40 mEq total) by mouth every morning. (Patient taking differently: Take 20 mEq by mouth daily. ) 60 tablet 0  . promethazine (PHENERGAN) 12.5 MG tablet Take 1 tablet (12.5 mg total) by mouth every 8 (eight) hours as needed for nausea or vomiting. 10 tablet 0  . metroNIDAZOLE (FLAGYL) 500 MG tablet Take 1 tablet (500 mg total) by mouth 2 (two) times daily. (Patient not taking: Reported on 07/11/2018) 10 tablet 2  . oseltamivir (TAMIFLU) 75 MG capsule Take 1 capsule (75 mg total) by mouth 2 (two) times daily. (Patient not taking: Reported on 10/20/2018) 10 capsule 0    SH: Social History   Socioeconomic History  . Marital status: Single    Spouse name: Not on file  . Number of children: Not on file  . Years of education: Not on file  . Highest education level: Not on file  Occupational History  . Not on file  Social Needs  . Financial resource strain: Not on file  . Food insecurity:    Worry: Not on file    Inability: Not on file   . Transportation needs:    Medical: Not on file    Non-medical: Not on file  Tobacco Use  . Smoking status: Never Smoker  . Smokeless tobacco: Never Used  Substance and Sexual Activity  . Alcohol use: Yes    Comment: rare  . Drug use: No  . Sexual activity: Not on file    Comment: BTL  Lifestyle  . Physical activity:    Days per week: Not on file    Minutes per session: Not on file  . Stress: Not on file  Relationships  . Social connections:    Talks on phone: Not on file    Gets together: Not on file    Attends religious service:  Not on file    Active member of club or organization: Not on file    Attends meetings of clubs or organizations: Not on file    Relationship status: Not on file  Other Topics Concern  . Not on file  Social History Narrative  . Not on file    FH: Family History  Problem Relation Age of Onset  . Hypertension Other   . Diabetes Other     Exam:  Zenaida Niece: OD: 20/20 near equivalent OS: 20/50 near equivalent  CVF: OD: full to count fingers OS: full to count fingers  EOM: OD: full d/v OS: limited abduction, supraduction, infraduction, adduction  Pupils: OD: 3->2 mm, no APD OS: 6-5.41mm, minimally reactive no APD by reverse  IOP: by [place] OD: 15 OS:  30  External: OD: no periorbital edema, no proptosis, V1-V3 intact and symmetric, good orbicularis strength OS: 2+ periorbital edema, 2+ ecchymosis, 1+ erythema, lagophthalmos ~23mm,   PF: 10mm/4mm  Pen Light Exam: L/L: OD: WNL OS: 2+ edema upper, 1+ lower,   C/S: OD: white and quiet OS: subconj heme 360, 1+ injection, temporal chemosis 1+  K: OD: clear, no abnormal staining OS: 1-2+ PEE/staining  A/C: OD: grossly deep and quiet appearing by pen light OS: grossly deep and quiet appearing by pen light  I: OD: round and regular OS: round and regular  L: OD: NSC OS: NSC  DFE: dilated @ 3:45 (OS ONLY) w/ Tropic and Phenyl  V:  OS: clear  N:  OS: C/D 0.55, no  disc edema  M: OS: flat, no obvious macular pathology  V: OS: normal appearing vessels  P: OS: retina flat 360, no obvious mass/RT/RD  A/P:  1. 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 - Discussed could consider night-time patching if not much improvement, or 24 hr patching OS.   2. Intraocular Hypertension OS - Likely due to CC fistula - Recommend Dorzolamide BID OS until embolization to temporize IOP - Will normalize likely after closure  - I will continue to follow every few days. Please call with any changes or concerns.  Wynell Balloon, MD,[facility] [phone]

## 2018-10-26 NOTE — Progress Notes (Signed)
Pt's Latic Acid elevated at 2.9. MD Blake Divine notified via text message. Orders received.

## 2018-10-26 NOTE — Progress Notes (Signed)
Pt returned to room 4 No 29 about 1015. She denies any h/a or N/V at this time. She is just tired and wants to rest. No focal abnormalities except L pupil enlargement.

## 2018-10-26 NOTE — Progress Notes (Signed)
PROGRESS NOTE    [name]  ZOX:[id number] DOB: 19-Nov-1970 DOA: 10/19/2018 PCP: Tresa Garter, MD   Brief Narrative:  48 year old female who presented with nausea, vomiting,andleft eye pain.She does have significant past medical history for Barrett's esophagus and hypertension. Was recently diagnosed with a 9 mm right ICA aneurysm which repair was on hold due to current COVID pandemic.Her initial physical examination blood pressure 138/96, heart rate 89, respirate 16, temperature 98, oxygen saturation 98%. She had mild proptosis of the left eye, subconjunctival hematoma and positive discomfort with eye movement. Moist mucous membranes, lungs clear to auscultation bilaterally, heart S1-S2 present rhythmic, abdomen soft, no lower extremity edema. CT angiography of the head showed abnormal fat stranding involving the intraconal fat of the left orbit with associated extraocular muscle enlargement and engorgement of the left superior orbital vein, findings concerning for possible cavernous carotid fistula or arteriovenous shunt about the cavernous sinus.Chest film with no infiltrates, left base pleural thickening, costophrenic angle.  Patient was admitted to the hospital with a working diagnosis ofleftcarotid cavernous fistula Assessment & Plan:   Principal Problem:   Carotid-cavernous fistula Active Problems:   Essential hypertension   Hypokalemia   Hypercalcemia   Aneurysm of right internal carotid artery   Acquired left carotid-cavernous fistula    Left ICA cavernous fistula s/p staged embolization using coils and stenting 10/23/2018 by Dr. Corliss Skains. Further recommendations as per IR.  Plan for staged embolization of the large fast flow lt ICA [facility] fistula with coiling and stenting on Monday, it was initially planned today, but got postponed as she developed nausea, and some tingling of the upper extremity.  A code stroke was called, but later on cancelled. CTA of the  head and neck reviewed.  Ophthalmology consulted for persistent pain in the left eye.  Continue with aspirin and plavix.   Hypertension:  Well controlled.  Holding the HCTZ.    Hypokalemia and hypomagnesemia:  Replaced.  REPEAT potassium tonight.   Anemia of chronic disease vs anemia of blood loss  ? - anemia panel reviewed.  - iron levels adequate.  - will check for stool for occult blood.   \nausea, no vomiting or abdominal pain:  Unclear etiology. abd x ray is negative for obstruction.  Unfortunately we cannot add omeprazole due to interaction with plavix.  Prn zofran and phenergan.  Liver enzymes are negative.  Lipase is negative.   GERD: tums added.    Leukocytosis: Unclear etiology. Probably reactive.  With elevated lactic acid. But she remains afebrile,  UA  Is negative for infection. CXR shows small effusions.  abd x ray does not show any acute abnormalities.  Venous duplex of the left upper extremity negative for DVT.     Anxiety:  On diazepam.    DVT prophylaxis: scd's Code Status: full code.  Family Communication: none at bedside.  Disposition Plan: pending clinical improvement.    Consultants:   IR   Procedures:  Left ICA cavernous fistula s/p staged embolization using coils and stenting 10/23/2018 by Dr. Corliss Skains.   Antimicrobials: none.    Subjective: Nausea better.  No vomiting or abdominal pain.  No tingling or numbness of the extremity.   Objective: Vitals:   10/26/18 1630 10/26/18 1700 10/26/18 1730 10/26/18 1800  BP: (!) 138/103 131/84 (!) 135/96 (!) 134/92  Pulse: 81 83 80 97  Resp: 16 18 18 15   Temp:      TempSrc:      SpO2: 100% 100% 100% 100%  Weight:  Height:        Intake/Output Summary (Last 24 hours) at 10/26/2018 1821 Last data filed at 10/26/2018 1600 Gross per 24 hour  Intake 2378.24 ml  Output --  Net 2378.24 ml   Filed Weights   10/19/18 2206 10/20/18 0642 10/26/18 0813  Weight: 50.3 kg 47.2 kg 47.2  kg    Examination:  General exam: left eye ecchyomosis , mild distress from pain in the left eye.  Respiratory system: Clear to auscultation. Respiratory effort normal. No wheezing or rhonchi.  Cardiovascular system: S1 & S2 heard, RRR. No JVD,  No pedal edema. Gastrointestinal system: Abdomen is soft NT ND bs+ Central nervous system: Alert and oriented.  Extremities: Symmetric 5 x 5 power. Skin: No rashes, lesions or ulcers Psychiatry: . Mood & affect appropriate.     Data Reviewed: I have personally reviewed following labs and imaging studies  CBC: Recent Labs  Lab 10/21/18 0401 10/23/18 0613 10/24/18 0425 10/24/18 1230 10/25/18 1011 10/26/18 0234  WBC 14.9* 12.8* 12.5*  --  18.0* 14.7*  NEUTROABS 9.8* 8.0* 9.1*  --  14.5* 11.0*  HGB 12.2 13.2 9.4* 9.4* 10.8* 10.1*  HCT 35.7* 38.6 28.3* 28.3* 32.2* 29.8*  MCV 87.3 87.9 90.7  --  89.9 87.9  PLT 271 291 219  --  222 209   Basic Metabolic Panel: Recent Labs  Lab 10/22/18 0514 10/23/18 0613 10/24/18 0425 10/25/18 0334 10/25/18 0906 10/25/18 1927 10/26/18 0234  NA 141 140 140 140  --   --  140  K 3.0* 3.7 3.3* 2.9*  --  3.1* 3.5  CL 107 107 109 107  --   --  103  CO2 24 22 17* 23  --   --  25  GLUCOSE 89 94 106* 106*  --   --  105*  BUN --   --  5*  CREATININE 0.86 0.76 0.76 0.80  --   --  0.62  CALCIUM 9.0 9.3 8.0* 8.1*  --   --  8.6*  MG 1.7 2.2  --   --  1.8  --   --    GFR: Estimated Creatinine Clearance: 64.1 mL/min (by C-G formula based on SCr of 0.62 mg/dL). Liver Function Tests: Recent Labs  Lab 10/19/18 1837 10/26/18 1129  AST 25 35  ALT 12 32  ALKPHOS 57 49  BILITOT 1.4* 1.4*  PROT 8.6* 7.6  ALBUMIN 5.2* 4.1   Recent Labs  Lab 10/19/18 1837 10/26/18 1129  LIPASE 23 22  AMYLASE  --  61   No results for input(s): AMMONIA in the last 168 hours. Coagulation Profile: Recent Labs  Lab 10/22/18 0923  INR 1.0   Cardiac Enzymes: No results for input(s): CKTOTAL, CKMB,  CKMBINDEX, TROPONINI in the last 168 hours. BNP (last 3 results) No results for input(s): PROBNP in the last 8760 hours. HbA1C: No results for input(s): HGBA1C in the last 72 hours. CBG: Recent Labs  Lab 10/23/18 1453  GLUCAP 151*   Lipid Profile: No results for input(s): CHOL, HDL, LDLCALC, TRIG, CHOLHDL, LDLDIRECT in the last 72 hours. Thyroid Function Tests: No results for input(s): TSH, T4TOTAL, FREET4, T3FREE, THYROIDAB in the last 72 hours. Anemia Panel: Recent Labs    10/25/18 1011  VITAMINB12 665  FOLATE 22.6  FERRITIN 121  TIBC 297  IRON 34   Sepsis Labs: Recent Labs  Lab 10/26/18 1129  LATICACIDVEN 2.9*    Recent Results (from the past 240 hour(s))  Novel Coronavirus, NAA (hospital order; send-out to ref lab)     Status: None   Collection Time: 10/22/18  2:57 PM  Result Value Ref Range Status   SARS-CoV-2, NAA NOT DETECTED NOT DETECTED Final    Comment: (NOTE) This test was developed and its performance characteristics determined by [employer]. This test has not been FDA cleared or approved. This test has been authorized by FDA under an Emergency Use Authorization (EUA). This test is only authorized for the duration of time the declaration that circumstances exist justifying the authorization of the emergency use of in vitro diagnostic tests for detection of SARS-CoV-2 virus and/or diagnosis of COVID-19 infection under section 564(b)(1) of the Act, 21 U.S.C. 161WRU-0(A)(5), unless the authorization is terminated or revoked sooner. When diagnostic testing is negative, the possibility of a false negative result should be considered in the context of a patient's recent exposures and the presence of clinical signs and symptoms consistent with COVID-19. An individual without symptoms of COVID-19 and who is not shedding SARS-CoV-2 virus would expect to have a negative (not detected) result in this assay. Performed  At: [facility] [address] [place], [place] [id number] Jolene Schimke MD NW:[id number]    Coronavirus Source NASOPHARYNGEAL  Final    Comment: Performed at [facility], [address] [place], [place] [address]  Surgical pcr screen     Status: None   Collection Time: 10/23/18  6:16 AM  Result Value Ref Range Status   MRSA, PCR NEGATIVE NEGATIVE Final   Staphylococcus aureus NEGATIVE NEGATIVE Final    Comment: (NOTE) The Xpert SA Assay (FDA approved for NASAL specimens in patients 45 years of age and older), is one component of a comprehensive surveillance program. It is not intended to diagnose infection nor to guide or monitor treatment. Performed at [facility], [address], [place], [place] [address]          Radiology Studies: Ct Angio Head W Or Wo Contrast  Result Date: 10/26/2018 CLINICAL DATA:  Right-sided paresthesia. Carotid cavernous fistula. Therapy embolization 10/23/2018 EXAM: CT ANGIOGRAPHY HEAD AND NECK TECHNIQUE: Multidetector CT imaging of the head and neck was performed using the standard protocol during bolus administration of intravenous contrast. Multiplanar CT image reconstructions and MIPs were obtained to evaluate the vascular anatomy. Carotid stenosis measurements (when applicable) are obtained utilizing NASCET criteria, using the distal internal carotid diameter as the denominator. CONTRAST:  75mL OMNIPAQUE IOHEXOL 350 MG/ML SOLN COMPARISON:  Multiple previous studies including angiography 10/23/2018 and CT 10/20/2018 FINDINGS: CT HEAD FINDINGS Brain: There is streak artifact related to embolic material at the skull base. Allowing for that, the brain has normal appearance without evidence of atrophy, infarction, mass lesion, hemorrhage, hydrocephalus or extra-axial collection. Vascular: Embolic material at the skull base as noted above. Skull: Negative Sinuses: Clear except for some layering fluid in the sphenoid sinus. Orbits: Normal Review of the MIP images  confirms the above findings CTA NECK FINDINGS Aortic arch: Normal except for minimal atherosclerotic calcification at the left subclavian artery origin. Right carotid system: No atherosclerotic disease at the bifurcation. Redemonstration tortuosity and aneurysmal dilatation of the distal right ICA maximal measurements 8 mm. Left carotid system: No atherosclerotic disease at the bifurcation. Redemonstration of tortuosity of the cervical ICA with aneurysmal dilatation up to 12 mm. Vertebral arteries: Normal Skeleton: Mild cervical spondylosis. Other neck: Normal Upper chest: Upper lung emphysema.  No active process. Review of the MIP images confirms the above findings  CTA HEAD FINDINGS Anterior circulation: There is extensive artifact at the skull base related to embolic material. There may also be a flow diverting stent. I do not see evidence of distal vessel stenosis or occlusion. Small vessel occlusion could be inapparent. Posterior circulation: Both vertebral arteries are patent to the basilar. No basilar pathology. Posterior circulation branch vessels appear normal. Venous sinuses: Continued prominence of the superior ophthalmic vein on the left, raising the possibility of ongoing fistulous communication. Other venous structures appear normal. Anatomic variants: None significant. Delayed phase: No abnormal enhancement. Review of the MIP images confirms the above findings IMPRESSION: No evidence of large or medium vessel occlusion. 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 Possible flow diverting stent in place. Streak artifact limits precise evaluation in that region. Continued prominence of the superior ophthalmic vein on the left and draining veins in the skull base and upper neck region. This raises the possibility that there could be a degree of ongoing cavernous carotid fistulous communication. Redemonstration of  aneurysmal changes of the upper cervical internal carotid arteries likely secondary to underlying fibromuscular disease or other intimal pathology. Electronically Signed   By: Paulina FusiMark  Shogry M.D.   On: 10/26/2018 10:01   Ct Head Wo Contrast  Result Date: 10/26/2018 CLINICAL DATA:  Right-sided paresthesia. Carotid cavernous fistula. Therapy embolization 10/23/2018 EXAM: CT ANGIOGRAPHY HEAD AND NECK TECHNIQUE: Multidetector CT imaging of the head and neck was performed using the standard protocol during bolus administration of intravenous contrast. Multiplanar CT image reconstructions and MIPs were obtained to evaluate the vascular anatomy. Carotid stenosis measurements (when applicable) are obtained utilizing NASCET criteria, using the distal internal carotid diameter as the denominator. CONTRAST:  75mL OMNIPAQUE IOHEXOL 350 MG/ML SOLN COMPARISON:  Multiple previous studies including angiography 10/23/2018 and CT 10/20/2018 FINDINGS: CT HEAD FINDINGS Brain: There is streak artifact related to embolic material at the skull base. Allowing for that, the brain has normal appearance without evidence of atrophy, infarction, mass lesion, hemorrhage, hydrocephalus or extra-axial collection. Vascular: Embolic material at the skull base as noted above. Skull: Negative Sinuses: Clear except for some layering fluid in the sphenoid sinus. Orbits: Normal Review of the MIP images confirms the above findings CTA NECK FINDINGS Aortic arch: Normal except for minimal atherosclerotic calcification at the left subclavian artery origin. Right carotid system: No atherosclerotic disease at the bifurcation. Redemonstration tortuosity and aneurysmal dilatation of the distal right ICA maximal measurements 8 mm. Left carotid system: No atherosclerotic disease at the bifurcation. Redemonstration of tortuosity of the cervical ICA with aneurysmal dilatation up to 12 mm. Vertebral arteries: Normal Skeleton: Mild cervical spondylosis. Other neck:  Normal Upper chest: Upper lung emphysema.  No active process. Review of the MIP images confirms the above findings CTA HEAD FINDINGS Anterior circulation: There is extensive artifact at the skull base related to embolic material. There may also be a flow diverting stent. I do not see evidence of distal vessel stenosis or occlusion. Small vessel occlusion could be inapparent. Posterior circulation: Both vertebral arteries are patent to the basilar. No basilar pathology. Posterior circulation branch vessels appear normal. Venous sinuses: Continued prominence of the superior ophthalmic vein on the left, raising the possibility of ongoing fistulous communication. Other venous structures appear normal. Anatomic variants: None significant. Delayed phase: No abnormal enhancement. Review of the MIP images confirms the above findings IMPRESSION: No evidence of large or medium vessel occlusion. Hyperdense material at the skull base primarily  on the left consistent with embolic material from treatment of cavernous carotid fistula. Possible flow diverting stent in place. Streak artifact limits precise evaluation in that region. Continued prominence of the superior ophthalmic vein on the left and draining veins in the skull base and upper neck region. This raises the possibility that there could be a degree of ongoing cavernous carotid fistulous communication. 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 Electronically Signed   By: Paulina Fusi M.D.   On: 10/26/2018 10:01   Ct Angio Neck W Or Wo Contrast  Result Date: 10/26/2018 CLINICAL DATA:  Right-sided paresthesia. Carotid cavernous fistula. Therapy embolization 10/23/2018 EXAM: CT ANGIOGRAPHY HEAD AND NECK TECHNIQUE: Multidetector CT imaging of the head and neck was performed using the standard protocol during bolus administration of intravenous  contrast. Multiplanar CT image reconstructions and MIPs were obtained to evaluate the vascular anatomy. Carotid stenosis measurements (when applicable) are obtained utilizing NASCET criteria, using the distal internal carotid diameter as the denominator. CONTRAST:  75mL OMNIPAQUE IOHEXOL 350 MG/ML SOLN COMPARISON:  Multiple previous studies including angiography 10/23/2018 and CT 10/20/2018 FINDINGS: CT HEAD FINDINGS Brain: There is streak artifact related to embolic material at the skull base. Allowing for that, the brain has normal appearance without evidence of atrophy, infarction, mass lesion, hemorrhage, hydrocephalus or extra-axial collection. Vascular: Embolic material at the skull base as noted above. Skull: Negative Sinuses: Clear except for some layering fluid in the sphenoid sinus. Orbits: Normal Review of the MIP images confirms the above findings CTA NECK FINDINGS Aortic arch: Normal except for minimal atherosclerotic calcification at the left subclavian artery origin. Right carotid system: No atherosclerotic disease at the bifurcation. Redemonstration tortuosity and aneurysmal dilatation of the distal right ICA maximal measurements 8 mm. Left carotid system: No atherosclerotic disease at the bifurcation. Redemonstration of tortuosity of the cervical ICA with aneurysmal dilatation up to 12 mm. Vertebral arteries: Normal Skeleton: Mild cervical spondylosis. Other neck: Normal Upper chest: Upper lung emphysema.  No active process. Review of the MIP images confirms the above findings CTA HEAD FINDINGS Anterior circulation: There is extensive artifact at the skull base related to embolic material. There may also be a flow diverting stent. I do not see evidence of distal vessel stenosis or occlusion. Small vessel occlusion could be inapparent. Posterior circulation: Both vertebral arteries are patent to the basilar. No basilar pathology. Posterior circulation branch vessels appear normal. Venous sinuses:  Continued prominence of the superior ophthalmic vein on the left, raising the possibility of ongoing fistulous communication. Other venous structures appear normal. Anatomic variants: None significant. Delayed phase: No abnormal enhancement. Review of the MIP images confirms the above findings IMPRESSION: No evidence of large or medium vessel occlusion. 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 Possible flow diverting stent in place. Streak artifact limits precise evaluation in that region. Continued prominence of the superior ophthalmic vein on the left and draining veins in the skull base and upper neck region. This raises the possibility that there could be a degree of ongoing cavernous carotid fistulous communication. 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 Electronically Signed   By: Paulina Fusi M.D.   On: 10/26/2018 10:01   Dg Chest Port 1 View  Result Date: 10/26/2018 CLINICAL DATA:  Elevated white blood  cell count. EXAM: PORTABLE CHEST 1 VIEW COMPARISON:  Single-view of the chest 10/20/2018. PA and lateral chest and CT chest 07/11/2018. FINDINGS: The patient has very small bilateral pleural effusions. Lungs are clear. Heart size is normal. No pneumothorax. No acute bony abnormality. IMPRESSION: Small bilateral pleural effusions.  Lungs are clear. Electronically Signed   By: Drusilla Kanner M.D.   On: 10/26/2018 14:25   Dg Abd 2 Views  Result Date: 10/26/2018 CLINICAL DATA:  Nausea and vomiting EXAM: ABDOMEN - 2 VIEW COMPARISON:  None. FINDINGS: Ovoid density in the pelvis attributed to full urinary bladder. The kidneys also are still excreting contrast. Normal bowel gas pattern. No pneumoperitoneum. IMPRESSION: 1. Distended urinary bladder. 2. Normal bowel gas pattern.  Electronically Signed   By: Marnee Spring M.D.   On: 10/26/2018 10:12   Vas [place] Upper Extremity Venous Duplex  Result Date: 10/26/2018 UPPER VENOUS STUDY  Indications: bruising Performing Technologist: Jeb Levering RDMS, RVT  Examination Guidelines: A complete evaluation includes B-mode imaging, spectral Doppler, color Doppler, and power Doppler as needed of all accessible portions of each vessel. Bilateral testing is considered an integral part of a complete examination. Limited examinations for reoccurring indications may be performed as noted.  Right Findings: +----------+------------+---------+-----------+----------+-------+  RIGHT      Compressible Phasicity Spontaneous Properties Summary  +----------+------------+---------+-----------+----------+-------+  Subclavian                 Yes        Yes                         +----------+------------+---------+-----------+----------+-------+  Left Findings: +----------+------------+---------+-----------+----------+-------+  LEFT       Compressible Phasicity Spontaneous Properties Summary  +----------+------------+---------+-----------+----------+-------+  IJV            Full        Yes        Yes                         +----------+------------+---------+-----------+----------+-------+  Subclavian     Full        Yes        Yes                         +----------+------------+---------+-----------+----------+-------+  Axillary       Full        Yes        Yes                         +----------+------------+---------+-----------+----------+-------+  Brachial       Full        Yes        Yes                         +----------+------------+---------+-----------+----------+-------+  Radial         Full                                               +----------+------------+---------+-----------+----------+-------+  Ulnar          Full                                               +----------+------------+---------+-----------+----------+-------+  Cephalic       Full                                                +----------+------------+---------+-----------+----------+-------+  Basilic        Full                                               +----------+------------+---------+-----------+----------+-------+  Summary:  Right: No evidence of thrombosis in the subclavian.  Left: No evidence of deep vein thrombosis in the upper extremity. No evidence of superficial vein thrombosis in the upper extremity.  *See table(s) above for measurements and observations.    Preliminary         Scheduled Meds:  artificial tears   Left Eye QHS   aspirin  325 mg Oral Daily   Or   aspirin  324 mg Per Tube Daily   brimonidine  1 drop Left Eye BID   calcium carbonate  1 tablet Oral BID WC   clopidogrel  75 mg Oral Daily   feeding supplement  1 Container Oral TID BM   polyvinyl alcohol  1 drop Left Eye QID   Continuous Infusions:  sodium chloride 75 mL/hr at 10/25/18 1934     LOS: 6 days    Time spent: 32 minutes.     Kathlen Mody, MD [facility] Pager [phone]  If 7PM-7AM, please contact night-coverage [email or web address] Password [facility] 10/26/2018, 6:21 PM

## 2018-10-26 NOTE — Progress Notes (Addendum)
Paged Dr. Corliss Skains per Anesthesia. Waiting on return call.  0826-second page to Dr. Irineo Axon on return call.

## 2018-10-27 LAB — BASIC METABOLIC PANEL
Anion gap: 12 (ref 5–15)
BUN: 7 mg/dL (ref 6–20)
CO2: 23 mmol/L (ref 22–32)
Calcium: 8.9 mg/dL (ref 8.9–10.3)
Chloride: 103 mmol/L (ref 98–111)
Creatinine, Ser: 0.63 mg/dL (ref 0.44–1.00)
GFR calc Af Amer: >60 mL/min (ref >60)
GFR calc non Af Amer: >60 mL/min (ref >60)
Glucose, Bld: 99 mg/dL (ref 70–99)
Potassium: 3.1 mmol/L — ABNORMAL LOW (ref 3.5–5.1)
Sodium: 138 mmol/L (ref 135–145)

## 2018-10-27 LAB — CBC
HCT: 31.6 % — ABNORMAL LOW (ref 36.0–46.0)
Hemoglobin: 10.7 g/dL — ABNORMAL LOW (ref 12.0–15.0)
MCH: 30.1 pg (ref 26.0–34.0)
MCHC: 33.9 g/dL (ref 30.0–36.0)
MCV: 88.8 fL (ref 80.0–100.0)
Platelets: 254 10*3/uL (ref 150–400)
RBC: 3.56 MIL/uL — ABNORMAL LOW (ref 3.87–5.11)
RDW: 13.2 % (ref 11.5–15.5)
WBC: 13.6 10*3/uL — ABNORMAL HIGH (ref 4.0–10.5)
nRBC: 0 % (ref 0.0–0.2)

## 2018-10-27 LAB — LACTIC ACID, PLASMA: Lactic Acid, Venous: 1.9 mmol/L (ref 0.5–1.9)

## 2018-10-27 MED ORDER — POTASSIUM CHLORIDE CRYS ER 20 MEQ PO TBCR
40.0000 meq | EXTENDED_RELEASE_TABLET | Freq: Two times a day (BID) | ORAL | Status: AC
  Administered 2018-10-27 (×2): 40 meq via ORAL
  Filled 2018-10-27 (×2): qty 2

## 2018-10-27 NOTE — Progress Notes (Signed)
PROGRESS NOTE    [name]  VWU:[id number] DOB: 01-Jan-1971 DOA: 10/19/2018 PCP: Tresa Garter, MD   Brief Narrative:  48 year old female who presented with nausea, vomiting,andleft eye pain.She does have significant past medical history for Barrett's esophagus and hypertension. Was recently diagnosed with a 9 mm right ICA aneurysm which repair was on hold due to current COVID pandemic.Her initial physical examination blood pressure 138/96, heart rate 89, respirate 16, temperature 98, oxygen saturation 98%. She had mild proptosis of the left eye, subconjunctival hematoma and positive discomfort with eye movement. Moist mucous membranes, lungs clear to auscultation bilaterally, heart S1-S2 present rhythmic, abdomen soft, no lower extremity edema. CT angiography of the head showed abnormal fat stranding involving the intraconal fat of the left orbit with associated extraocular muscle enlargement and engorgement of the left superior orbital vein, findings concerning for possible cavernous carotid fistula or arteriovenous shunt about the cavernous sinus.Chest film with no infiltrates, left base pleural thickening, costophrenic angle.  Patient was admitted to the hospital with a working diagnosis ofleftcarotid cavernous fistula Assessment & Plan:   Principal Problem:   Carotid-cavernous fistula Active Problems:   Essential hypertension   Hypokalemia   Hypercalcemia   Aneurysm of right internal carotid artery   Acquired left carotid-cavernous fistula    Left ICA cavernous fistula s/p staged embolization using coils and stenting 10/23/2018 by Dr. Corliss Skains. Further recommendations as per neuro IR.  Plan for staged embolization of the large fast flow lt ICA [facility] fistula with coiling and stenting on Monday, it was initially planned for friday, but got postponed as she developed nausea, and some tingling of the upper extremity.  A code stroke was called, but later on cancelled.  CTA of the head and neck reviewed.  Ophthalmology consulted for persistent pain in the left eye.  Continue with aspirin and plavix.   Hypertension:  Well controlled.  Holding the HCTZ.    Hypokalemia and hypomagnesemia:  Replaced.    Anemia of chronic disease vs anemia of blood loss  ? - anemia panel reviewed.  - iron levels adequate.  - will check for stool for occult blood.   \nausea, no vomiting or abdominal pain:  Unclear etiology. abd x ray is negative for obstruction.  Unfortunately we cannot add omeprazole due to interaction with plavix.  Prn zofran and phenergan.  Liver enzymes are negative.  Lipase is negative.   GERD: tums added. No nausea today.    Leukocytosis: Unclear etiology. Probably reactive.  With elevated lactic acid. But she remains afebrile,  UA  Is negative for infection. CXR shows small effusions, but no pneumonia. She does not appear to be toxic.   abd x ray does not show any acute abnormalities.  Venous duplex of the left upper extremity negative for DVT.  Blood cultures ordered.     Anxiety:  On diazepam.    DVT prophylaxis: scd's Code Status: full code.  Family Communication: none at bedside.  Disposition Plan: pending clinical improvement and further work up by [employer]    Consultants:   IR   Procedures:  Left ICA cavernous fistula s/p staged embolization using coils and stenting 10/23/2018 by Dr. Corliss Skains.   Antimicrobials: none.    Subjective: No nausea today, No vomiting or abdominal pain.  No tingling or numbness of the extremity.   Objective: Vitals:   10/27/18 1200 10/27/18 1300 10/27/18 1400 10/27/18 1500  BP: (!) 140/94 111/73 122/87 134/82  Pulse: 76 64 65 78  Resp: 18 18 19  17  Temp: 97.7 F (36.5 C)     TempSrc: Oral     SpO2: 100% 97% 98% 100%  Weight:      Height:        Intake/Output Summary (Last 24 hours) at 10/27/2018 1600 Last data filed at 10/27/2018 1500 Gross per 24 hour  Intake 1450.72 ml    Output 2200 ml  Net -749.28 ml   Filed Weights   10/19/18 2206 10/20/18 0642 10/26/18 0813  Weight: 50.3 kg 47.2 kg 47.2 kg    Examination:  General exam: left eye ecchyomosis , left eye pain is better.  Respiratory system: Clear to auscultation. Respiratory effort normal. No wheezing or rhonchi.  Cardiovascular system: S1 & S2 heard, RRR. No JVD,  No pedal edema. Gastrointestinal system: Abdomen is soft NT ND bs+ Central nervous system: Alert and oriented.  Extremities: Symmetric 5 x 5 power. Skin: No rashes, lesions or ulcers Psychiatry: . Mood & affect appropriate.     Data Reviewed: I have personally reviewed following labs and imaging studies  CBC: Recent Labs  Lab 10/21/18 0401 10/23/18 0613 10/24/18 0425 10/24/18 1230 10/25/18 1011 10/26/18 0234 10/27/18 0449  WBC 14.9* 12.8* 12.5*  --  18.0* 14.7* 13.6*  NEUTROABS 9.8* 8.0* 9.1*  --  14.5* 11.0*  --   HGB 12.2 13.2 9.4* 9.4* 10.8* 10.1* 10.7*  HCT 35.7* 38.6 28.3* 28.3* 32.2* 29.8* 31.6*  MCV 87.3 87.9 90.7  --  89.9 87.9 88.8  PLT 271 291 219  --  222 209 254   Basic Metabolic Panel: Recent Labs  Lab 10/22/18 0514 10/23/18 0613 10/24/18 0425 10/25/18 0334 10/25/18 0906 10/25/18 1927 10/26/18 0234 10/27/18 0449  NA 141 140 140 140  --   --  140 138  K 3.0* 3.7 3.3* 2.9*  --  3.1* 3.5 3.1*  CL 107 107 109 107  --   --  103 103  CO2 24 22 17* 23  --   --  25 23  GLUCOSE 89 94 106* 106*  --   --  105* 99  BUN 11 13 13 9   --   --  5* 7  CREATININE 0.86 0.76 0.76 0.80  --   --  0.62 0.63  CALCIUM 9.0 9.3 8.0* 8.1*  --   --  8.6* 8.9  MG 1.7 2.2  --   --  1.8  --   --   --    GFR: Estimated Creatinine Clearance: 64.1 mL/min (by C-G formula based on SCr of 0.63 mg/dL). Liver Function Tests: Recent Labs  Lab 10/26/18 1129  AST 35  ALT 32  ALKPHOS 49  BILITOT 1.4*  PROT 7.6  ALBUMIN 4.1   Recent Labs  Lab 10/26/18 1129  LIPASE 22  AMYLASE 61   No results for input(s): AMMONIA in the  last 168 hours. Coagulation Profile: Recent Labs  Lab 10/22/18 0923  INR 1.0   Cardiac Enzymes: No results for input(s): CKTOTAL, CKMB, CKMBINDEX, TROPONINI in the last 168 hours. BNP (last 3 results) No results for input(s): PROBNP in the last 8760 hours. HbA1C: No results for input(s): HGBA1C in the last 72 hours. CBG: Recent Labs  Lab 10/23/18 1453  GLUCAP 151*   Lipid Profile: No results for input(s): CHOL, HDL, LDLCALC, TRIG, CHOLHDL, LDLDIRECT in the last 72 hours. Thyroid Function Tests: No results for input(s): TSH, T4TOTAL, FREET4, T3FREE, THYROIDAB in the last 72 hours. Anemia Panel: Recent Labs    10/25/18 1011  VITAMINB12  665  FOLATE 22.6  FERRITIN 121  TIBC 297  IRON 34   Sepsis Labs: Recent Labs  Lab 10/26/18 1129  LATICACIDVEN 2.9*    Recent Results (from the past 240 hour(s))  Novel Coronavirus, NAA (hospital order; send-out to ref lab)     Status: None   Collection Time: 10/22/18  2:57 PM  Result Value Ref Range Status   SARS-CoV-2, NAA NOT DETECTED NOT DETECTED Final    Comment: (NOTE) This test was developed and its performance characteristics determined by [employer]. This test has not been FDA cleared or approved. This test has been authorized by FDA under an Emergency Use Authorization (EUA). This test is only authorized for the duration of time the declaration that circumstances exist justifying the authorization of the emergency use of in vitro diagnostic tests for detection of SARS-CoV-2 virus and/or diagnosis of COVID-19 infection under section 564(b)(1) of the Act, 21 U.S.C. 161WRU-0(A)(5), unless the authorization is terminated or revoked sooner. When diagnostic testing is negative, the possibility of a false negative result should be considered in the context of a patient's recent exposures and the presence of clinical signs and symptoms consistent with COVID-19. An individual without symptoms of COVID-19 and who is not  shedding SARS-CoV-2 virus would expect to have a negative (not detected) result in this assay. Performed  At: [facility] [address] [place], [place] [id number] Jolene Schimke MD NW:[id number]    Coronavirus Source NASOPHARYNGEAL  Final    Comment: Performed at [facility], [address] [place], [place] [address]  Surgical pcr screen     Status: None   Collection Time: 10/23/18  6:16 AM  Result Value Ref Range Status   MRSA, PCR NEGATIVE NEGATIVE Final   Staphylococcus aureus NEGATIVE NEGATIVE Final    Comment: (NOTE) The Xpert SA Assay (FDA approved for NASAL specimens in patients 11 years of age and older), is one component of a comprehensive surveillance program. It is not intended to diagnose infection nor to guide or monitor treatment. Performed at [facility], [address], [place], [place] [address]          Radiology Studies: Ct Angio Head W Or Wo Contrast  Result Date: 10/26/2018 CLINICAL DATA:  Right-sided paresthesia. Carotid cavernous fistula. Therapy embolization 10/23/2018 EXAM: CT ANGIOGRAPHY HEAD AND NECK TECHNIQUE: Multidetector CT imaging of the head and neck was performed using the standard protocol during bolus administration of intravenous contrast. Multiplanar CT image reconstructions and MIPs were obtained to evaluate the vascular anatomy. Carotid stenosis measurements (when applicable) are obtained utilizing NASCET criteria, using the distal internal carotid diameter as the denominator. CONTRAST:  75mL OMNIPAQUE IOHEXOL 350 MG/ML SOLN COMPARISON:  Multiple previous studies including angiography 10/23/2018 and CT 10/20/2018 FINDINGS: CT HEAD FINDINGS Brain: There is streak artifact related to embolic material at the skull base. Allowing for that, the brain has normal appearance without evidence of atrophy, infarction, mass lesion, hemorrhage, hydrocephalus or extra-axial collection. Vascular: Embolic material at the skull base as  noted above. Skull: Negative Sinuses: Clear except for some layering fluid in the sphenoid sinus. Orbits: Normal Review of the MIP images confirms the above findings CTA NECK FINDINGS Aortic arch: Normal except for minimal atherosclerotic calcification at the left subclavian artery origin. Right carotid system: No atherosclerotic disease at the bifurcation. Redemonstration tortuosity and aneurysmal dilatation of the distal right ICA maximal measurements 8 mm. Left carotid system: No atherosclerotic disease at the bifurcation. Redemonstration of tortuosity of the  cervical ICA with aneurysmal dilatation up to 12 mm. Vertebral arteries: Normal Skeleton: Mild cervical spondylosis. Other neck: Normal Upper chest: Upper lung emphysema.  No active process. Review of the MIP images confirms the above findings CTA HEAD FINDINGS Anterior circulation: There is extensive artifact at the skull base related to embolic material. There may also be a flow diverting stent. I do not see evidence of distal vessel stenosis or occlusion. Small vessel occlusion could be inapparent. Posterior circulation: Both vertebral arteries are patent to the basilar. No basilar pathology. Posterior circulation branch vessels appear normal. Venous sinuses: Continued prominence of the superior ophthalmic vein on the left, raising the possibility of ongoing fistulous communication. Other venous structures appear normal. Anatomic variants: None significant. Delayed phase: No abnormal enhancement. Review of the MIP images confirms the above findings IMPRESSION: No evidence of large or medium vessel occlusion. 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 Possible flow diverting stent in place. Streak artifact limits precise evaluation in that region. Continued prominence of the superior ophthalmic vein on the left and draining veins in the skull base and upper neck  region. This raises the possibility that there could be a degree of ongoing cavernous carotid fistulous communication. 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 Electronically Signed   By: Paulina Fusi M.D.   On: 10/26/2018 10:01   Ct Head Wo Contrast  Result Date: 10/26/2018 CLINICAL DATA:  Right-sided paresthesia. Carotid cavernous fistula. Therapy embolization 10/23/2018 EXAM: CT ANGIOGRAPHY HEAD AND NECK TECHNIQUE: Multidetector CT imaging of the head and neck was performed using the standard protocol during bolus administration of intravenous contrast. Multiplanar CT image reconstructions and MIPs were obtained to evaluate the vascular anatomy. Carotid stenosis measurements (when applicable) are obtained utilizing NASCET criteria, using the distal internal carotid diameter as the denominator. CONTRAST:  75mL OMNIPAQUE IOHEXOL 350 MG/ML SOLN COMPARISON:  Multiple previous studies including angiography 10/23/2018 and CT 10/20/2018 FINDINGS: CT HEAD FINDINGS Brain: There is streak artifact related to embolic material at the skull base. Allowing for that, the brain has normal appearance without evidence of atrophy, infarction, mass lesion, hemorrhage, hydrocephalus or extra-axial collection. Vascular: Embolic material at the skull base as noted above. Skull: Negative Sinuses: Clear except for some layering fluid in the sphenoid sinus. Orbits: Normal Review of the MIP images confirms the above findings CTA NECK FINDINGS Aortic arch: Normal except for minimal atherosclerotic calcification at the left subclavian artery origin. Right carotid system: No atherosclerotic disease at the bifurcation. Redemonstration tortuosity and aneurysmal dilatation of the distal right ICA maximal measurements 8 mm. Left carotid system: No atherosclerotic disease at the bifurcation. Redemonstration of tortuosity  of the cervical ICA with aneurysmal dilatation up to 12 mm. Vertebral arteries: Normal Skeleton: Mild cervical spondylosis. Other neck: Normal Upper chest: Upper lung emphysema.  No active process. Review of the MIP images confirms the above findings CTA HEAD FINDINGS Anterior circulation: There is extensive artifact at the skull base related to embolic material. There may also be a flow diverting stent. I do not see evidence of distal vessel stenosis or occlusion. Small vessel occlusion could be inapparent. Posterior circulation: Both vertebral arteries are patent to the basilar. No basilar pathology. Posterior circulation branch vessels appear normal. Venous sinuses: Continued prominence of the superior ophthalmic vein on the left, raising the possibility of ongoing fistulous communication. Other venous  structures appear normal. Anatomic variants: None significant. Delayed phase: No abnormal enhancement. Review of the MIP images confirms the above findings IMPRESSION: No evidence of large or medium vessel occlusion. 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 Possible flow diverting stent in place. Streak artifact limits precise evaluation in that region. Continued prominence of the superior ophthalmic vein on the left and draining veins in the skull base and upper neck region. This raises the possibility that there could be a degree of ongoing cavernous carotid fistulous communication. 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 Electronically Signed   By: Paulina Fusi M.D.   On: 10/26/2018 10:01   Ct Angio Neck W Or Wo Contrast  Result Date: 10/26/2018 CLINICAL DATA:  Right-sided paresthesia. Carotid cavernous fistula. Therapy embolization 10/23/2018 EXAM: CT ANGIOGRAPHY HEAD AND NECK TECHNIQUE:  Multidetector CT imaging of the head and neck was performed using the standard protocol during bolus administration of intravenous contrast. Multiplanar CT image reconstructions and MIPs were obtained to evaluate the vascular anatomy. Carotid stenosis measurements (when applicable) are obtained utilizing NASCET criteria, using the distal internal carotid diameter as the denominator. CONTRAST:  75mL OMNIPAQUE IOHEXOL 350 MG/ML SOLN COMPARISON:  Multiple previous studies including angiography 10/23/2018 and CT 10/20/2018 FINDINGS: CT HEAD FINDINGS Brain: There is streak artifact related to embolic material at the skull base. Allowing for that, the brain has normal appearance without evidence of atrophy, infarction, mass lesion, hemorrhage, hydrocephalus or extra-axial collection. Vascular: Embolic material at the skull base as noted above. Skull: Negative Sinuses: Clear except for some layering fluid in the sphenoid sinus. Orbits: Normal Review of the MIP images confirms the above findings CTA NECK FINDINGS Aortic arch: Normal except for minimal atherosclerotic calcification at the left subclavian artery origin. Right carotid system: No atherosclerotic disease at the bifurcation. Redemonstration tortuosity and aneurysmal dilatation of the distal right ICA maximal measurements 8 mm. Left carotid system: No atherosclerotic disease at the bifurcation. Redemonstration of tortuosity of the cervical ICA with aneurysmal dilatation up to 12 mm. Vertebral arteries: Normal Skeleton: Mild cervical spondylosis. Other neck: Normal Upper chest: Upper lung emphysema.  No active process. Review of the MIP images confirms the above findings CTA HEAD FINDINGS Anterior circulation: There is extensive artifact at the skull base related to embolic material. There may also be a flow diverting stent. I do not see evidence of distal vessel stenosis or occlusion. Small vessel occlusion could be inapparent. Posterior circulation: Both  vertebral arteries are patent to the basilar. No basilar pathology. Posterior circulation branch vessels appear normal. Venous sinuses: Continued prominence of the superior ophthalmic vein on the left, raising the possibility of ongoing fistulous communication. Other venous structures appear normal. Anatomic variants: None significant. Delayed phase: No abnormal enhancement. Review of the MIP images confirms the above findings IMPRESSION: No evidence of large or medium vessel occlusion. 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 Possible flow diverting stent in place. Streak artifact limits precise evaluation in that region. Continued prominence of the superior ophthalmic vein on the left and draining veins in the skull base and upper neck region. This raises the possibility that there could be a degree of ongoing cavernous carotid fistulous communication. Redemonstration of aneurysmal changes of the upper cervical internal carotid arteries likely secondary to underlying  fibromuscular disease or other intimal pathology. Electronically Signed   By: Paulina Fusi M.D.   On: 10/26/2018 10:01   Dg Chest Port 1 View  Result Date: 10/26/2018 CLINICAL DATA:  Elevated white blood cell count. EXAM: PORTABLE CHEST 1 VIEW COMPARISON:  Single-view of the chest 10/20/2018. PA and lateral chest and CT chest 07/11/2018. FINDINGS: The patient has very small bilateral pleural effusions. Lungs are clear. Heart size is normal. No pneumothorax. No acute bony abnormality. IMPRESSION: Small bilateral pleural effusions.  Lungs are clear. Electronically Signed   By: Drusilla Kanner M.D.   On: 10/26/2018 14:25   Dg Abd 2 Views  Result Date: 10/26/2018 CLINICAL DATA:  Nausea and vomiting EXAM: ABDOMEN - 2 VIEW COMPARISON:  None. FINDINGS: Ovoid density in the pelvis attributed to full urinary bladder. The kidneys also are still excreting  contrast. Normal bowel gas pattern. No pneumoperitoneum. IMPRESSION: 1. Distended urinary bladder. 2. Normal bowel gas pattern. Electronically Signed   By: Marnee Spring M.D.   On: 10/26/2018 10:12   Vas [place] Upper Extremity Venous Duplex  Result Date: 10/26/2018 UPPER VENOUS STUDY  Indications: bruising Performing Technologist: Jeb Levering RDMS, RVT  Examination Guidelines: A complete evaluation includes B-mode imaging, spectral Doppler, color Doppler, and power Doppler as needed of all accessible portions of each vessel. Bilateral testing is considered an integral part of a complete examination. Limited examinations for reoccurring indications may be performed as noted.  Right Findings: +----------+------------+---------+-----------+----------+-------+  RIGHT      Compressible Phasicity Spontaneous Properties Summary  +----------+------------+---------+-----------+----------+-------+  Subclavian                 Yes        Yes                         +----------+------------+---------+-----------+----------+-------+  Left Findings: +----------+------------+---------+-----------+----------+-------+  LEFT       Compressible Phasicity Spontaneous Properties Summary  +----------+------------+---------+-----------+----------+-------+  IJV            Full        Yes        Yes                         +----------+------------+---------+-----------+----------+-------+  Subclavian     Full        Yes        Yes                         +----------+------------+---------+-----------+----------+-------+  Axillary       Full        Yes        Yes                         +----------+------------+---------+-----------+----------+-------+  Brachial       Full        Yes        Yes                         +----------+------------+---------+-----------+----------+-------+  Radial         Full                                               +----------+------------+---------+-----------+----------+-------+  Ulnar  Full                                                +----------+------------+---------+-----------+----------+-------+  Cephalic       Full                                               +----------+------------+---------+-----------+----------+-------+  Basilic        Full                                               +----------+------------+---------+-----------+----------+-------+  Summary:  Right: No evidence of thrombosis in the subclavian.  Left: No evidence of deep vein thrombosis in the upper extremity. No evidence of superficial vein thrombosis in the upper extremity.  *See table(s) above for measurements and observations.    Preliminary         Scheduled Meds:  artificial tears   Left Eye QHS   aspirin  325 mg Oral Daily   Or   aspirin  324 mg Per Tube Daily   brimonidine  1 drop Left Eye BID   calcium carbonate  1 tablet Oral BID WC   clopidogrel  75 mg Oral Daily   feeding supplement  1 Container Oral TID BM   polyvinyl alcohol  1 drop Left Eye QID   potassium chloride  40 mEq Oral BID   Continuous Infusions:  sodium chloride 75 mL/hr at 10/27/18 1500     LOS: 7 days    Time spent: 32 minutes.     Kathlen Mody, MD [facility] Pager [phone]  If 7PM-7AM, please contact night-coverage [email or web address] Password [email or web address] 10/27/2018, 4:00 PM

## 2018-10-28 LAB — BASIC METABOLIC PANEL
Anion gap: 10 (ref 5–15)
BUN: 7 mg/dL (ref 6–20)
CO2: 22 mmol/L (ref 22–32)
Calcium: 8.6 mg/dL — ABNORMAL LOW (ref 8.9–10.3)
Chloride: 103 mmol/L (ref 98–111)
Creatinine, Ser: 0.58 mg/dL (ref 0.44–1.00)
GFR calc Af Amer: >60 mL/min (ref >60)
GFR calc non Af Amer: >60 mL/min (ref >60)
Glucose, Bld: 105 mg/dL — ABNORMAL HIGH (ref 70–99)
Potassium: 3.7 mmol/L (ref 3.5–5.1)
Sodium: 135 mmol/L (ref 135–145)

## 2018-10-28 LAB — CBC
HCT: 32.5 % — ABNORMAL LOW (ref 36.0–46.0)
Hemoglobin: 10.9 g/dL — ABNORMAL LOW (ref 12.0–15.0)
MCH: 29.7 pg (ref 26.0–34.0)
MCHC: 33.5 g/dL (ref 30.0–36.0)
MCV: 88.6 fL (ref 80.0–100.0)
Platelets: 312 10*3/uL (ref 150–400)
RBC: 3.67 MIL/uL — ABNORMAL LOW (ref 3.87–5.11)
RDW: 13.2 % (ref 11.5–15.5)
WBC: 11.8 10*3/uL — ABNORMAL HIGH (ref 4.0–10.5)
nRBC: 0 % (ref 0.0–0.2)

## 2018-10-28 NOTE — Progress Notes (Signed)
PROGRESS NOTE    [name]  VXY:[id number] DOB: 1970/08/09 DOA: 10/19/2018 PCP: Tresa Garter, MD   Brief Narrative:  48 year old female who presented with nausea, vomiting,andleft eye pain.She does have significant past medical history for Barrett's esophagus and hypertension. Was recently diagnosed with a 9 mm right ICA aneurysm which repair was on hold due to current COVID pandemic.Her initial physical examination blood pressure 138/96, heart rate 89, respirate 16, temperature 98, oxygen saturation 98%. She had mild proptosis of the left eye, subconjunctival hematoma and positive discomfort with eye movement. Moist mucous membranes, lungs clear to auscultation bilaterally, heart S1-S2 present rhythmic, abdomen soft, no lower extremity edema. CT angiography of the head showed abnormal fat stranding involving the intraconal fat of the left orbit with associated extraocular muscle enlargement and engorgement of the left superior orbital vein, findings concerning for possible cavernous carotid fistula or arteriovenous shunt about the cavernous sinus.Chest film with no infiltrates, left base pleural thickening, costophrenic angle.  Patient was admitted to the hospital with a working diagnosis ofleftcarotid cavernous fistula Assessment & Plan:   Principal Problem:   Carotid-cavernous fistula Active Problems:   Essential hypertension   Hypokalemia   Hypercalcemia   Aneurysm of right internal carotid artery   Acquired left carotid-cavernous fistula    Left ICA cavernous fistula s/p staged embolization using coils and stenting 10/23/2018 by Dr. Corliss Skains. Further recommendations as per neuro IR.  Plan for staged embolization of the large fast flow lt ICA [facility] fistula with coiling and stenting on Monday, it was initially planned for friday, but got postponed as she developed nausea, and some tingling of the upper extremity.  A code stroke was called, but later on cancelled.  CTA of the head and neck reviewed.  Ophthalmology consulted for persistent pain in the left eye.  Continue with aspirin and plavix.   Hypertension:  Well controlled.  Holding the HCTZ.    Hypokalemia and hypomagnesemia:  Replaced.    Anemia of chronic disease vs anemia of blood loss  ? - anemia panel reviewed.  - iron levels adequate.  - will check for stool for occult blood.   nausea, no vomiting or abdominal pain:  Resolved. Unclear etiology. abd x ray is negative for obstruction.  Unfortunately we cannot add omeprazole due to interaction with plavix.  Prn zofran and phenergan.  Liver enzymes are negative.  Lipase is negative.   GERD: tums added. No nausea today.    Leukocytosis: Unclear etiology. Probably reactive.  With elevated lactic acid. But she remains afebrile,  UA  Is negative for infection. CXR shows small effusions, but no pneumonia. She does not appear to be toxic.   abd x ray does not show any acute abnormalities.  Venous duplex of the left upper extremity negative for DVT.  Blood cultures ordered and negative so far.    Anxiety:  On diazepam.    DVT prophylaxis: scd's Code Status: full code.  Family Communication: none at bedside.  Disposition Plan: pending clinical improvement and further work up by [employer]    Consultants:   IR   Procedures:  Left ICA cavernous fistula s/p staged embolization using coils and stenting 10/23/2018 by Dr. Corliss Skains.   Antimicrobials: none.    Subjective: No nausea today, No vomiting or abdominal pain.  No tingling or numbness of the extremity.  No new complaints.   Objective: Vitals:   10/28/18 1500 10/28/18 1600 10/28/18 1700 10/28/18 1800  BP: 133/89 127/85 (!) 145/92 (!) 145/97  Pulse:  71 62 63 78  Resp: (!) Temp:  99 F (37.2 C)    TempSrc:  Oral    SpO2: 98% 98% 100% 100%  Weight:      Height:        Intake/Output Summary (Last 24 hours) at 10/28/2018 1839 Last data filed at  10/28/2018 1700 Gross per 24 hour  Intake 1527.66 ml  Output 1975 ml  Net -447.34 ml   Filed Weights   10/19/18 2206 10/20/18 0642 10/26/18 0813  Weight: 50.3 kg 47.2 kg 47.2 kg    Examination:  General exam: left eye ecchyomosis , left eye pain is better.  Respiratory system: Clear to auscultation. Respiratory effort normal. No wheezing or rhonchi.  Cardiovascular system: S1 & S2 heard, RRR. No JVD,  No pedal edema. Gastrointestinal system: Abdomen is soft NT ND bs+ Central nervous system: Alert and oriented.  Extremities: Symmetric 5 x 5 power. Skin: No rashes, lesions or ulcers Psychiatry: . Mood & affect appropriate.     Data Reviewed: I have personally reviewed following labs and imaging studies  CBC: Recent Labs  Lab 10/23/18 0613 10/24/18 0425 10/24/18 1230 10/25/18 1011 10/26/18 0234 10/27/18 0449 10/28/18 1002  WBC 12.8* 12.5*  --  18.0* 14.7* 13.6* 11.8*  NEUTROABS 8.0* 9.1*  --  14.5* 11.0*  --   --   HGB 13.2 9.4* 9.4* 10.8* 10.1* 10.7* 10.9*  HCT 38.6 28.3* 28.3* 32.2* 29.8* 31.6* 32.5*  MCV 87.9 90.7  --  89.9 87.9 88.8 88.6  PLT 291 219  --  222 209 254 312   Basic Metabolic Panel: Recent Labs  Lab 10/22/18 0514 10/23/18 0613 10/24/18 0425 10/25/18 0334 10/25/18 0906 10/25/18 1927 10/26/18 0234 10/27/18 0449 10/28/18 1002  NA 141 140 140 140  --   --  140 138 135  K 3.0* 3.7 3.3* 2.9*  --  3.1* 3.5 3.1* 3.7  CL 107 107 109 107  --   --  103 103 103  CO2 24 22 17* 23  --   --  GLUCOSE 89 94 106* 106*  --   --  105* 99 105*  BUN --   --  5* 7 7  CREATININE 0.86 0.76 0.76 0.80  --   --  0.62 0.63 0.58  CALCIUM 9.0 9.3 8.0* 8.1*  --   --  8.6* 8.9 8.6*  MG 1.7 2.2  --   --  1.8  --   --   --   --    GFR: Estimated Creatinine Clearance: 64.1 mL/min (by C-G formula based on SCr of 0.58 mg/dL). Liver Function Tests: Recent Labs  Lab 10/26/18 1129  AST 35  ALT 32  ALKPHOS 49  BILITOT 1.4*  PROT 7.6  ALBUMIN 4.1    Recent Labs  Lab 10/26/18 1129  LIPASE 22  AMYLASE 61   No results for input(s): AMMONIA in the last 168 hours. Coagulation Profile: Recent Labs  Lab 10/22/18 0923  INR 1.0   Cardiac Enzymes: No results for input(s): CKTOTAL, CKMB, CKMBINDEX, TROPONINI in the last 168 hours. BNP (last 3 results) No results for input(s): PROBNP in the last 8760 hours. HbA1C: No results for input(s): HGBA1C in the last 72 hours. CBG: Recent Labs  Lab 10/23/18 1453  GLUCAP 151*   Lipid Profile: No results for input(s): CHOL, HDL, LDLCALC, TRIG, CHOLHDL, LDLDIRECT in the last 72 hours. Thyroid Function Tests: No  results for input(s): TSH, T4TOTAL, FREET4, T3FREE, THYROIDAB in the last 72 hours. Anemia Panel: No results for input(s): VITAMINB12, FOLATE, FERRITIN, TIBC, IRON, RETICCTPCT in the last 72 hours. Sepsis Labs: Recent Labs  Lab 10/26/18 1129 10/27/18 1633  LATICACIDVEN 2.9* 1.9    Recent Results (from the past 240 hour(s))  Novel Coronavirus, NAA (hospital order; send-out to ref lab)     Status: None   Collection Time: 10/22/18  2:57 PM  Result Value Ref Range Status   SARS-CoV-2, NAA NOT DETECTED NOT DETECTED Final    Comment: (NOTE) This test was developed and its performance characteristics determined by [facility]. This test has not been FDA cleared or approved. This test has been authorized by FDA under an Emergency Use Authorization (EUA). This test is only authorized for the duration of time the declaration that circumstances exist justifying the authorization of the emergency use of in vitro diagnostic tests for detection of SARS-CoV-2 virus and/or diagnosis of COVID-19 infection under section 564(b)(1) of the Act, 21 U.S.C. 161WRU-0(A)(5360bbb-3(b)(1), unless the authorization is terminated or revoked sooner. When diagnostic testing is negative, the possibility of a false negative result should be considered in the context of a patient's recent exposures and the  presence of clinical signs and symptoms consistent with COVID-19. An individual without symptoms of COVID-19 and who is not shedding SARS-CoV-2 virus would expect to have a negative (not detected) result in this assay. Performed  At: [facility] [address] [place], [place] [id number] Jolene SchimkeNagendra Sanjai MD NW:[id number]:[phone]    Coronavirus Source NASOPHARYNGEAL  Final    Comment: Performed at [facility], [address] [place], [place] [address]  Surgical pcr screen     Status: None   Collection Time: 10/23/18  6:16 AM  Result Value Ref Range Status   MRSA, PCR NEGATIVE NEGATIVE Final   Staphylococcus aureus NEGATIVE NEGATIVE Final    Comment: (NOTE) The Xpert SA Assay (FDA approved for NASAL specimens in patients 322 years of age and older), is one component of a comprehensive surveillance program. It is not intended to diagnose infection nor to guide or monitor treatment. Performed at [facility], [address] [place], [place] [address]   Culture, blood (Routine X 2) w Reflex to ID Panel     Status: None (Preliminary result)   Collection Time: 10/27/18  6:46 PM  Result Value Ref Range Status   Specimen Description BLOOD RIGHT HAND  Final   Special Requests   Final    AEROBIC BOTTLE ONLY Blood Culture results may not be optimal due to an inadequate volume of blood received in culture bottles   Culture   Final    NO GROWTH < 24 HOURS Performed at [facility], [address] [place], [place] [address]    Report Status PENDING  Incomplete  Culture, blood (Routine X 2) w Reflex to ID Panel     Status: None (Preliminary result)   Collection Time: 10/27/18  6:47 PM  Result Value Ref Range Status   Specimen Description BLOOD RIGHT THUMB  Final   Special Requests   Final    AEROBIC BOTTLE ONLY Blood Culture results may not be optimal due to an inadequate volume of blood received in culture bottles   Culture   Final    NO GROWTH < 24 HOURS Performed at  [facility], [address] [place], [place] [address]    Report Status PENDING  Incomplete  Radiology Studies: No results found.      Scheduled Meds: . artificial tears   Left Eye QHS  . aspirin  325 mg Oral Daily   Or  . aspirin  324 mg Per Tube Daily  . brimonidine  1 drop Left Eye BID  . calcium carbonate  1 tablet Oral BID WC  . clopidogrel  75 mg Oral Daily  . feeding supplement  1 Container Oral TID BM  . polyvinyl alcohol  1 drop Left Eye QID   Continuous Infusions:    LOS: 8 days    Time spent: 28 minutes.     Kathlen Mody, MD [facility] Pager [phone]  If 7PM-7AM, please contact night-coverage [email or web address] Password [facility] 10/28/2018, 6:39 PM

## 2018-10-28 NOTE — Progress Notes (Addendum)
Patient ID: [name], female   DOB: Apr 02, 1971, 48 y.o.   MRN: [id number] NIR note via phone call with nurse:  Pt with known left ICA cavernous fistula with prior coiling on 10/20/18, tent scheduled for second staged embo on 5/8 but postponed due to nausea, rt hand numbness; now neurologically stable per nurse- no nausea or extremity paresthesias; only c/o is feeling tired from little sleep last night; prior groin access site ok; remains on ASA/plavix VSS; AF WBC 11.8(13.6), hgb 10.9(10.7), blood cx neg to date; UA ok; CXR with small effusions but no active infectious process; abd film ok; LUE venous duplex neg, CT angio head 5/8:  No evidence of large or medium vessel occlusion.  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 Possible flow diverting stent in place. Streak artifact limits precise evaluation in that region.  Continued prominence of the superior ophthalmic vein on the left and draining veins in the skull base and upper neck region. This raises the possibility that there could be a degree of ongoing cavernous carotid fistulous communication.  Redemonstration of aneurysmal changes of the upper cervical internal carotid arteries likely secondary to underlying fibromuscular disease or other intimal pathology  Will make pt NPO after MN tonight, check am labs and reassess status in am prior to making decision whether to proceed with catheter directed cerebral/carotid angio/embo; appreciate [facility] medical management of pt

## 2018-10-28 NOTE — Anesthesia Preprocedure Evaluation (Addendum)
Anesthesia Evaluation  Patient identified by MRN, date of birth, ID band Patient awake    Reviewed: Allergy & Precautions, NPO status , Patient's Chart, lab work & pertinent test results  History of Anesthesia Complications (+) PONVNegative for: history of anesthetic complications  Airway Mallampati: II  TM Distance: >3 FB Neck ROM: Full    Dental no notable dental hx.    Pulmonary asthma , COPD,    Pulmonary exam normal        Cardiovascular hypertension, Normal cardiovascular exam     Neuro/Psych L ICA cavernous fistula s/p first stage embolization negative psych ROS   GI/Hepatic Neg liver ROS, hiatal hernia, GERD  ,  Endo/Other  negative endocrine ROS  Renal/GU negative Renal ROS  negative genitourinary   Musculoskeletal negative musculoskeletal ROS (+)   Abdominal   Peds  Hematology negative hematology ROS (+)   Anesthesia Other Findings 1148 F for second stage IR embolization of left carotid cavernous fistula - asthma/COPD, HTN, GERD - 2019 TTE unremarkable  Procedure cancelled 5/8 due to elevated white count, nausea/vomiting and right hand tingling. Code stroke called but workup was negative.  Reproductive/Obstetrics                                                            Anesthesia Evaluation  Patient identified by MRN, date of birth, ID band Patient awake    Reviewed: Allergy & Precautions, NPO status , Patient's Chart, lab work & pertinent test results  History of Anesthesia Complications (+) PONV  Airway Mallampati: II  TM Distance: >3 FB Neck ROM: Full    Dental  (+) Teeth Intact, Dental Advisory Given   Pulmonary asthma , COPD,    Pulmonary exam normal breath sounds clear to auscultation       Cardiovascular hypertension, Normal cardiovascular exam Rhythm:Regular Rate:Normal  TTE 2019 unremarkable, EF 60-65%   Neuro/Psych  Headaches, Anxiety  Depression Left ICA cavernous fistula    GI/Hepatic Neg liver ROS, hiatal hernia, GERD  Controlled,  Endo/Other  negative endocrine ROS  Renal/GU negative Renal ROS     Musculoskeletal negative musculoskeletal ROS (+)   Abdominal   Peds  Hematology negative hematology ROS (+)   Anesthesia Other Findings Day of surgery medications reviewed with the patient.  Reproductive/Obstetrics                            Anesthesia Physical Anesthesia Plan  ASA: II  Anesthesia Plan: General   Post-op Pain Management:    Induction: Intravenous  PONV Risk Score and Plan: 4 or greater and Treatment may vary due to age or medical condition, Ondansetron, Midazolam and Propofol infusion  Airway Management Planned: Oral ETT  Additional Equipment: Arterial line  Intra-op Plan:   Post-operative Plan: Extubation in OR  Informed Consent: I have reviewed the patients History and Physical, chart, labs and discussed the procedure including the risks, benefits and alternatives for the proposed anesthesia with the patient or authorized representative who has indicated his/her understanding and acceptance.     Dental advisory given  Plan Discussed with: CRNA  Anesthesia Plan Comments:       Anesthesia Quick Evaluation  Anesthesia Physical Anesthesia Plan  ASA: III  Anesthesia Plan: General   Post-op Pain Management:  Induction: Intravenous and Rapid sequence  PONV Risk Score and Plan: 3 and Ondansetron, Dexamethasone and Treatment may vary due to age or medical condition  Airway Management Planned: Oral ETT  Additional Equipment: Arterial line  Intra-op Plan:   Post-operative Plan: Extubation in OR  Informed Consent: I have reviewed the patients History and Physical, chart, labs and discussed the procedure including the risks, benefits and alternatives for the proposed anesthesia with the patient or authorized representative who has indicated  his/her understanding and acceptance.     Dental advisory given  Plan Discussed with: CRNA, Anesthesiologist and Surgeon  Anesthesia Plan Comments:       Anesthesia Quick Evaluation

## 2018-10-29 ENCOUNTER — Encounter (HOSPITAL_COMMUNITY): Payer: Self-pay

## 2018-10-29 ENCOUNTER — Inpatient Hospital Stay (HOSPITAL_COMMUNITY): Payer: 59

## 2018-10-29 DIAGNOSIS — I671 Cerebral aneurysm, nonruptured: Principal | ICD-10-CM

## 2018-10-29 DIAGNOSIS — Z881 Allergy status to other antibiotic agents status: Secondary | ICD-10-CM

## 2018-10-29 DIAGNOSIS — Z888 Allergy status to other drugs, medicaments and biological substances status: Secondary | ICD-10-CM

## 2018-10-29 DIAGNOSIS — M109 Gout, unspecified: Secondary | ICD-10-CM

## 2018-10-29 DIAGNOSIS — Z91048 Other nonmedicinal substance allergy status: Secondary | ICD-10-CM

## 2018-10-29 DIAGNOSIS — D72829 Elevated white blood cell count, unspecified: Secondary | ICD-10-CM

## 2018-10-29 DIAGNOSIS — Z885 Allergy status to narcotic agent status: Secondary | ICD-10-CM

## 2018-10-29 LAB — COMPREHENSIVE METABOLIC PANEL
ALT: 18 U/L (ref 0–44)
AST: 14 U/L — ABNORMAL LOW (ref 15–41)
Albumin: 3.6 g/dL (ref 3.5–5.0)
Alkaline Phosphatase: 47 U/L (ref 38–126)
Anion gap: 15 (ref 5–15)
BUN: 8 mg/dL (ref 6–20)
CO2: 25 mmol/L (ref 22–32)
Calcium: 9.6 mg/dL (ref 8.9–10.3)
Chloride: 97 mmol/L — ABNORMAL LOW (ref 98–111)
Creatinine, Ser: 0.71 mg/dL (ref 0.44–1.00)
GFR calc Af Amer: >60 mL/min (ref >60)
GFR calc non Af Amer: >60 mL/min (ref >60)
Glucose, Bld: 102 mg/dL — ABNORMAL HIGH (ref 70–99)
Potassium: 3.3 mmol/L — ABNORMAL LOW (ref 3.5–5.1)
Sodium: 137 mmol/L (ref 135–145)
Total Bilirubin: 1.1 mg/dL (ref 0.3–1.2)
Total Protein: 6.6 g/dL (ref 6.5–8.1)

## 2018-10-29 LAB — CBC WITH DIFFERENTIAL/PLATELET
Abs Immature Granulocytes: 0.07 10*3/uL (ref 0.00–0.07)
Abs Immature Granulocytes: 0.08 10*3/uL — ABNORMAL HIGH (ref 0.00–0.07)
Basophils Absolute: 0.1 10*3/uL (ref 0.0–0.1)
Basophils Absolute: 0 10*3/uL (ref 0.0–0.1)
Basophils Relative: 0 %
Basophils Relative: 0 %
Eosinophils Absolute: 0.3 10*3/uL (ref 0.0–0.5)
Eosinophils Absolute: 0.3 10*3/uL (ref 0.0–0.5)
Eosinophils Relative: 2 %
Eosinophils Relative: 2 %
HCT: 35.5 % — ABNORMAL LOW (ref 36.0–46.0)
HCT: 38.7 % (ref 36.0–46.0)
Hemoglobin: 12.2 g/dL (ref 12.0–15.0)
Hemoglobin: 12.5 g/dL (ref 12.0–15.0)
Immature Granulocytes: 1 %
Immature Granulocytes: 1 %
Lymphocytes Relative: 12 %
Lymphocytes Relative: 15 %
Lymphs Abs: 1.8 10*3/uL (ref 0.7–4.0)
Lymphs Abs: 2.3 10*3/uL (ref 0.7–4.0)
MCH: 30 pg (ref 26.0–34.0)
MCH: 29.3 pg (ref 26.0–34.0)
MCHC: 34.4 g/dL (ref 30.0–36.0)
MCHC: 32.3 g/dL (ref 30.0–36.0)
MCV: 87.4 fL (ref 80.0–100.0)
MCV: 90.6 fL (ref 80.0–100.0)
Monocytes Absolute: 0.9 10*3/uL (ref 0.1–1.0)
Monocytes Absolute: 0.9 10*3/uL (ref 0.1–1.0)
Monocytes Relative: 6 %
Monocytes Relative: 6 %
Neutro Abs: 11.9 10*3/uL — ABNORMAL HIGH (ref 1.7–7.7)
Neutro Abs: 11.8 10*3/uL — ABNORMAL HIGH (ref 1.7–7.7)
Neutrophils Relative %: 79 %
Neutrophils Relative %: 76 %
Platelets: 356 10*3/uL (ref 150–400)
Platelets: 357 10*3/uL (ref 150–400)
RBC: 4.06 MIL/uL (ref 3.87–5.11)
RBC: 4.27 MIL/uL (ref 3.87–5.11)
RDW: 13.5 % (ref 11.5–15.5)
RDW: 13.8 % (ref 11.5–15.5)
WBC: 15 10*3/uL — ABNORMAL HIGH (ref 4.0–10.5)
WBC: 15.3 10*3/uL — ABNORMAL HIGH (ref 4.0–10.5)
nRBC: 0 % (ref 0.0–0.2)
nRBC: 0 % (ref 0.0–0.2)

## 2018-10-29 LAB — PROTIME-INR
INR: 1.1 (ref 0.8–1.2)
Prothrombin Time: 13.9 seconds (ref 11.4–15.2)

## 2018-10-29 LAB — PLATELET INHIBITION P2Y12: Platelet Function  P2Y12: 125 [PRU] — ABNORMAL LOW (ref 182–335)

## 2018-10-29 MED ORDER — VANCOMYCIN HCL IN DEXTROSE 1-5 GM/200ML-% IV SOLN
1000.0000 mg | INTRAVENOUS | Status: DC
  Administered 2018-10-30: 1000 mg via INTRAVENOUS
  Filled 2018-10-29: qty 200

## 2018-10-29 MED ORDER — SCOPOLAMINE 1 MG/3DAYS TD PT72: 1.0000 | MEDICATED_PATCH | TRANSDERMAL | Status: DC

## 2018-10-29 MED ORDER — PIPERACILLIN-TAZOBACTAM 3.375 G IVPB: 3.3750 g | Freq: Three times a day (TID) | INTRAVENOUS | Status: DC

## 2018-10-29 MED ORDER — FENTANYL CITRATE (PF) 100 MCG/2ML IJ SOLN
INTRAMUSCULAR | Status: AC
  Filled 2018-10-29: qty 2

## 2018-10-29 MED ORDER — PIPERACILLIN-TAZOBACTAM 3.375 G IVPB
3.3750 g | Freq: Three times a day (TID) | INTRAVENOUS | Status: AC
  Administered 2018-10-29 – 2018-10-31 (×6): 3.375 g via INTRAVENOUS
  Filled 2018-10-29 (×6): qty 50

## 2018-10-29 MED ORDER — VANCOMYCIN HCL IN DEXTROSE 1-5 GM/200ML-% IV SOLN
1000.0000 mg | Freq: Once | INTRAVENOUS | Status: AC
  Administered 2018-10-29: 1000 mg via INTRAVENOUS
  Filled 2018-10-29: qty 200

## 2018-10-29 MED ORDER — SODIUM CHLORIDE 0.9 % IV SOLN: INTRAVENOUS | Status: DC

## 2018-10-29 MED ORDER — SCOPOLAMINE 1 MG/3DAYS TD PT72
MEDICATED_PATCH | TRANSDERMAL | Status: AC
  Filled 2018-10-29: qty 1

## 2018-10-29 NOTE — Consult Note (Signed)
[facility]       Reason for Consult: leukocytosis    Referring Physician: Dr. Blake Divine  Principal Problem:   Carotid-cavernous fistula Active Problems:   Essential hypertension   Hypokalemia   Hypercalcemia   Aneurysm of right internal carotid artery   Acquired left carotid-cavernous fistula   . artificial tears   Left Eye QHS  . aspirin  325 mg Oral Daily   Or  . aspirin  324 mg Per Tube Daily  . brimonidine  1 drop Left Eye BID  . calcium carbonate  1 tablet Oral BID WC  . clopidogrel  75 mg Oral Daily  . feeding supplement  1 Container Oral TID BM  . polyvinyl alcohol  1 drop Left Eye QID  . scopolamine        Recommendations: Stop antibiotics   Assessment: She has a WBC of 15.3 and a baseline WBC of 10-18 per her record.  No signs or symptoms of acute bacterial infection.  Per Dr. Corliss Skains, she will not though get her procedure without 48 hours of vancomycin + piperacillin/tazobactam.  I am unclear of the indication for the antibiotics as she has no signs of active infection.  Her only issue is a low-level persistent leukocytosis which is typical for her.   I will defer to the primary team in regards to stopping antibiotics as she needs the procedure, though there will be no benefit with antibiotic therapy.    Antibiotics: Vancomycin and Zosyn  HPI: [name] is a 48 y.o. female with history of gout, came in to the ED 5/2 with a "whooshing" sound in her ear.  She had some left eye pain as well.  She is known to have had a right ICA aneurysm and on 5/5 underwent staged embolization of large fast flow Lt ICA CCF fistula with coiling and stenting.  Was to get additional treatment 5/8 but developed n/v.  Then went to IR today but with a WBC of 15.3 the procedure was cancelled and antibiotics started as above.  She is having no further n/v.  No diarrhea.  No dysuria, no abdominal pain or discomfort.     Review of Systems:  Constitutional: negative for fevers, chills, fatigue, malaise and anorexia Respiratory: negative for cough, sputum, hemoptysis or dyspnea on exertion Gastrointestinal: negative for nausea, vomiting and diarrhea Genitourinary: negative for frequency, dysuria, nocturia and genital lesions Integument/breast: negative for rash Hematologic/lymphatic: negative for lymphadenopathy Musculoskeletal: negative for myalgias and arthralgias All other systems reviewed and are negative    Past Medical History:  Diagnosis Date  . Allergic rhinitis   . Anxiety   . B12 deficiency   . Barrett's esophagus   . Chronic constipation   . 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 Depression   . GERD (gastroesophageal reflux disease)   . Hiatal hernia   . History of DVT of lower extremity    08-14-2003  RIGHT LOWER EXTREMITY-- TREATED W/ COUMADIN----  per pt none since  . History of palpitations   . Hypertension   . Idiopathic chronic gout    12-20-2017  per pt stable ,   last episode 02/ 2019  . Iron deficiency anemia   . Migraines   . Personal history of PE (pulmonary embolism)    03/ 2005 RIGHT SIDE  --- treated w/ coumadin---  per pt none since  . PONV (postoperative nausea and vomiting)   .  Tinea versicolor   . Vitamin D deficiency     Social History   Tobacco Use  . Smoking status: Never Smoker  . Smokeless tobacco: Never Used  Substance Use Topics  . Alcohol use: Yes    Comment: rare  . Drug use: No    Family History  Problem Relation Age of Onset  . Hypertension Other   . Diabetes Other     Allergies  Allergen Reactions  . Guaifenesin Shortness Of Breath  . Iron Anaphylaxis and Other (See Comments)    IV only  . Doxepin Hives  . Pseudoeph-Doxylamine-Dm-Apap Hives  . Pseudoephedrine Hives  . Sulfa Antibiotics Hives  . Hydrocodone-Acetaminophen     UNSPECIFIED REACTION   . Metoprolol Succinate Swelling    UNSPECIFIED EDEMA  . Celexa  [Citalopram Hydrobromide] Other (See Comments)    HEADACHE   . Codeine Other (See Comments)    tiredness  . Hydromorphone Hcl Nausea And Vomiting  . Morphine Other (See Comments)    Does not work; hallucinations and irritable  . Pantoprazole Sodium Other (See Comments)    heartburn  . Prednisone Rash  . Tetracyclines & Related Rash    Physical Exam: Constitutional: in no apparent distress  Vitals:   10/29/18 1300 10/29/18 1400  BP: (!) 145/100 140/87  Pulse: 88 79  Resp: 17 20  Temp:    SpO2: 100% 98%   EYES: anicteric ENMT: no thrush Cardiovascular: Cor RRR Respiratory: CTA B; normal respiratory effort GI: Bowel sounds are normal, liver is not enlarged, spleen is not enlarged Musculoskeletal: no pedal edema noted Skin: negatives: no rash Hematologic: no cervical lad  Lab Results  Component Value Date   WBC 15.3 (H) 10/29/2018   HGB 12.5 10/29/2018   HCT 38.7 10/29/2018   MCV 90.6 10/29/2018   PLT 357 10/29/2018    Lab Results  Component Value Date   CREATININE 0.71 10/29/2018   BUN 8 10/29/2018   NA 137 10/29/2018   K 3.3 (L) 10/29/2018   CL 97 (L) 10/29/2018   CO2 25 10/29/2018    Lab Results  Component Value Date   ALT 18 10/29/2018   AST 14 (L) 10/29/2018   ALKPHOS 47 10/29/2018     Microbiology: Recent Results (from the past 240 hour(s))  Novel Coronavirus, NAA (hospital order; send-out to ref lab)     Status: None   Collection Time: 10/22/18  2:57 PM  Result Value Ref Range Status   SARS-CoV-2, NAA NOT DETECTED NOT DETECTED Final    Comment: (NOTE) This test was developed and its performance characteristics determined by [employer]. This test has not been FDA cleared or approved. This test has been authorized by FDA under an Emergency Use Authorization (EUA). This test is only authorized for the duration of time the declaration that circumstances exist justifying the authorization of the emergency use of in vitro diagnostic tests  for detection of SARS-CoV-2 virus and/or diagnosis of COVID-19 infection under section 564(b)(1) of the Act, 21 U.S.C. 754HKG-6(V)(7), unless the authorization is terminated or revoked sooner. When diagnostic testing is negative, the possibility of a false negative result should be considered in the context of a patient's recent exposures and the presence of clinical signs and symptoms consistent with COVID-19. An individual without symptoms of COVID-19 and who is not shedding SARS-CoV-2 virus would expect to have a negative (not detected) result in this assay. Performed  At: [facility] [address] [place], [place] [id number] Jolene Schimke MD LY:[id number]  Coronavirus Source NASOPHARYNGEAL  Final    Comment: Performed at [facility], [address] [place], [place] [address]  Surgical pcr screen     Status: None   Collection Time: 10/23/18  6:16 AM  Result Value Ref Range Status   MRSA, PCR NEGATIVE NEGATIVE Final   Staphylococcus aureus NEGATIVE NEGATIVE Final    Comment: (NOTE) The Xpert SA Assay (FDA approved for NASAL specimens in patients 49 years of age and older), is one component of a comprehensive surveillance program. It is not intended to diagnose infection nor to guide or monitor treatment. Performed at [facility], [address] [place], [place] [address]   Culture, blood (Routine X 2) w Reflex to ID Panel     Status: None (Preliminary result)   Collection Time: 10/27/18  6:46 PM  Result Value Ref Range Status   Specimen Description BLOOD RIGHT HAND  Final   Special Requests   Final    AEROBIC BOTTLE ONLY Blood Culture results may not be optimal due to an inadequate volume of blood received in culture bottles   Culture   Final    NO GROWTH 2 DAYS Performed at [facility], [address] [place], [place] [address]    Report Status PENDING  Incomplete  Culture, blood (Routine X 2) w Reflex to ID Panel     Status: None  (Preliminary result)   Collection Time: 10/27/18  6:47 PM  Result Value Ref Range Status   Specimen Description BLOOD RIGHT THUMB  Final   Special Requests   Final    AEROBIC BOTTLE ONLY Blood Culture results may not be optimal due to an inadequate volume of blood received in culture bottles   Culture   Final    NO GROWTH 2 DAYS Performed at [facility], [address] [place], [place] [address]    Report Status PENDING  Incomplete    Gardiner Barefootobert W Isabella Ida, MD [facility] [email or web address] 10/29/2018, 2:18 PM

## 2018-10-29 NOTE — Progress Notes (Signed)
Spoke with Connye Burkitt, PA-C about no orders being found for surgical procedure, as well as pre-op meds to be given. Awaiting orders.

## 2018-10-29 NOTE — Progress Notes (Signed)
PROGRESS NOTE    [name]  VXB:[id number] DOB: 1970/08/16 DOA: 10/19/2018 PCP: Tresa Garter, MD   Brief Narrative:  48 year old female who presented with nausea, vomiting,andleft eye pain.She does have significant past medical history for Barrett's esophagus and hypertension. Was recently diagnosed with a 9 mm right ICA aneurysm which repair was on hold due to current COVID pandemic.Her initial physical examination blood pressure 138/96, heart rate 89, respirate 16, temperature 98, oxygen saturation 98%. She had mild proptosis of the left eye, subconjunctival hematoma and positive discomfort with eye movement. Moist mucous membranes, lungs clear to auscultation bilaterally, heart S1-S2 present rhythmic, abdomen soft, no lower extremity edema. CT angiography of the head showed abnormal fat stranding involving the intraconal fat of the left orbit with associated extraocular muscle enlargement and engorgement of the left superior orbital vein, findings concerning for possible cavernous carotid fistula or arteriovenous shunt about the cavernous sinus.Chest film with no infiltrates, left base pleural thickening, costophrenic angle.  Patient was admitted to the hospital with a working diagnosis ofleftcarotid cavernous fistula Assessment & Plan:   Principal Problem:   Carotid-cavernous fistula Active Problems:   Essential hypertension   Hypokalemia   Hypercalcemia   Aneurysm of right internal carotid artery   Acquired left carotid-cavernous fistula    Left ICA cavernous fistula s/p staged embolization using coils and stenting 10/23/2018 by Dr. Corliss Skains. Further recommendations as per neuro IR.  Plan for staged embolization of the large fast flow lt ICA CCF fistula with coiling and stenting on monday, it was initially planned for friday, but got postponed as she developed nausea, and some tingling of the upper extremity.  A code stroke was called, but later on cancelled.  CTA of the head and neck done and  reviewed.   The stenting was cancelled again today by IR due to leukocytosis.  Ophthalmology consulted for persistent pain in the left eye.  Continue with aspirin and plavix.   Hypertension:  Well controlled.  Holding the HCTZ.    Hypokalemia and hypomagnesemia:  Replaced.    Anemia of chronic disease vs anemia of blood loss  ? - anemia panel reviewed.  - iron levels adequate.  - will check for stool for occult blood.   nausea, no vomiting or abdominal pain:  Resolved. Unclear etiology. abd x ray is negative for obstruction.  Unfortunately we cannot add omeprazole due to interaction with plavix.  Prn zofran and phenergan.  Liver enzymes are negative.  Lipase is negative.   GERD: tums added. No nausea today.    Leukocytosis: Unclear etiology. Probably reactive.  With elevated lactic acid. But she remains afebrile,  UA  Is negative for infection. CXR shows small effusions, but no pneumonia. She does not appear to be toxic.   abd x ray does not show any acute abnormalities.  Venous duplex of the left upper extremity negative for DVT.  Blood cultures ordered and negative so far.  IR wanted the patient on empiric antibiotics for 48 hours and wants to do the procedure on Wednesday.  ID consulted for evaluation of leukocytosis.    Anxiety:  On diazepam.    DVT prophylaxis: scd's Code Status: full code.  Family Communication: none at bedside.  Disposition Plan: pending clinical improvement and further work up by [employer]    Consultants:   IR   Procedures:  Left ICA cavernous fistula s/p staged embolization using coils and stenting 10/23/2018 by Dr. Corliss Skains.   Antimicrobials: none.    Subjective: No nausea  today, No vomiting or abdominal pain.  No tingling or numbness of the extremity.  No new complaints.   Objective: Vitals:   10/29/18 1400 10/29/18 1500 10/29/18 1600 10/29/18 1700  BP: 140/87 117/74 120/78 116/71  Pulse:  79 76 70 71  Resp: (!) 22  Temp:   98.3 F (36.8 C)   TempSrc:   Oral   SpO2: 98% 98% 98% 98%  Weight:      Height:        Intake/Output Summary (Last 24 hours) at 10/29/2018 1802 Last data filed at 10/29/2018 1600 Gross per 24 hour  Intake 650.23 ml  Output 400 ml  Net 250.23 ml   Filed Weights   10/20/18 0642 10/26/18 0813 10/29/18 0831  Weight: 47.2 kg 47.2 kg 47.2 kg    Examination:  General exam: left eye ecchyomosis , left eye pain is better.  Respiratory system: Clear to auscultation. Respiratory effort normal. No wheezing or rhonchi.  Cardiovascular system: S1 & S2 heard, RRR. No JVD,  No pedal edema. Gastrointestinal system: Abdomen is soft NT ND bs+ Central nervous system: Alert and oriented.  Extremities: Symmetric 5 x 5 power. Skin: No rashes, lesions or ulcers Psychiatry: . Mood & affect appropriate.     Data Reviewed: I have personally reviewed following labs and imaging studies  CBC: Recent Labs  Lab 10/24/18 0425  10/25/18 1011 10/26/18 0234 10/27/18 0449 10/28/18 1002 10/29/18 0726 10/29/18 0851  WBC 12.5*  --  18.0* 14.7* 13.6* 11.8* 15.0* 15.3*  NEUTROABS 9.1*  --  14.5* 11.0*  --   --  11.9* 11.8*  HGB 9.4*   < > 10.8* 10.1* 10.7* 10.9* 12.2 12.5  HCT 28.3*   < > 32.2* 29.8* 31.6* 32.5* 35.5* 38.7  MCV 90.7  --  89.9 87.9 88.8 88.6 87.4 90.6  PLT 219  --  222 209 254 312 356 357   < > = values in this interval not displayed.   Basic Metabolic Panel: Recent Labs  Lab 10/23/18 0613  10/25/18 0334 10/25/18 0906 10/25/18 1927 10/26/18 0234 10/27/18 0449 10/28/18 1002 10/29/18 0726  NA 140   < > 140  --   --  140 138 135 137  K 3.7   < > 2.9*  --  3.1* 3.5 3.1* 3.7 3.3*  CL 107   < > 107  --   --  103 103 103 97*  CO2 22   < > 23  --   --  GLUCOSE 94   < > 106*  --   --  105* 99 105* 102*  BUN 13   < > 9  --   --  5* CREATININE 0.76   < > 0.80  --   --  0.62 0.63 0.58 0.71  CALCIUM 9.3   < > 8.1*  --    --  8.6* 8.9 8.6* 9.6  MG 2.2  --   --  1.8  --   --   --   --   --    < > = values in this interval not displayed.   GFR: Estimated Creatinine Clearance: 64.1 mL/min (by C-G formula based on SCr of 0.71 mg/dL). Liver Function Tests: Recent Labs  Lab 10/26/18 1129 10/29/18 0726  AST 35 14*  ALT 32 18  ALKPHOS 49 47  BILITOT 1.4* 1.1  PROT 7.6 6.6  ALBUMIN 4.1 3.6   Recent Labs  Lab 10/26/18 1129  LIPASE 22  AMYLASE 61   No results for input(s): AMMONIA in the last 168 hours. Coagulation Profile: Recent Labs  Lab 10/29/18 0726  INR 1.1   Cardiac Enzymes: No results for input(s): CKTOTAL, CKMB, CKMBINDEX, TROPONINI in the last 168 hours. BNP (last 3 results) No results for input(s): PROBNP in the last 8760 hours. HbA1C: No results for input(s): HGBA1C in the last 72 hours. CBG: Recent Labs  Lab 10/23/18 1453  GLUCAP 151*   Lipid Profile: No results for input(s): CHOL, HDL, LDLCALC, TRIG, CHOLHDL, LDLDIRECT in the last 72 hours. Thyroid Function Tests: No results for input(s): TSH, T4TOTAL, FREET4, T3FREE, THYROIDAB in the last 72 hours. Anemia Panel: No results for input(s): VITAMINB12, FOLATE, FERRITIN, TIBC, IRON, RETICCTPCT in the last 72 hours. Sepsis Labs: Recent Labs  Lab 10/26/18 1129 10/27/18 1633  LATICACIDVEN 2.9* 1.9    Recent Results (from the past 240 hour(s))  Novel Coronavirus, NAA (hospital order; send-out to ref lab)     Status: None   Collection Time: 10/22/18  2:57 PM  Result Value Ref Range Status   SARS-CoV-2, NAA NOT DETECTED NOT DETECTED Final    Comment: (NOTE) This test was developed and its performance characteristics determined by [employer]. This test has not been FDA cleared or approved. This test has been authorized by FDA under an Emergency Use Authorization (EUA). This test is only authorized for the duration of time the declaration that circumstances exist justifying the authorization of the emergency use of  in vitro diagnostic tests for detection of SARS-CoV-2 virus and/or diagnosis of COVID-19 infection under section 564(b)(1) of the Act, 21 U.S.C. 161WRU-0(A)(5), unless the authorization is terminated or revoked sooner. When diagnostic testing is negative, the possibility of a false negative result should be considered in the context of a patient's recent exposures and the presence of clinical signs and symptoms consistent with COVID-19. An individual without symptoms of COVID-19 and who is not shedding SARS-CoV-2 virus would expect to have a negative (not detected) result in this assay. Performed  At: [facility] [address] [place], [place] [id number] Jolene Schimke MD NW:[id number]    Coronavirus Source NASOPHARYNGEAL  Final    Comment: Performed at [facility], [address] [place], [place] [address]  Surgical pcr screen     Status: None   Collection Time: 10/23/18  6:16 AM  Result Value Ref Range Status   MRSA, PCR NEGATIVE NEGATIVE Final   Staphylococcus aureus NEGATIVE NEGATIVE Final    Comment: (NOTE) The Xpert SA Assay (FDA approved for NASAL specimens in patients 84 years of age and older), is one component of a comprehensive surveillance program. It is not intended to diagnose infection nor to guide or monitor treatment. Performed at [facility], [address], [place], [place] [address]   Culture, blood (Routine X 2) w Reflex to ID Panel     Status: None (Preliminary result)   Collection Time: 10/27/18  6:46 PM  Result Value Ref Range Status   Specimen Description BLOOD RIGHT HAND  Final   Special Requests   Final    AEROBIC BOTTLE ONLY Blood Culture results may not be optimal due to an inadequate volume of blood received in culture bottles   Culture   Final    NO GROWTH 2 DAYS Performed at [facility], [address], [place], [place] [address]    Report Status PENDING  Incomplete  Culture, blood (Routine  X 2) w Reflex to ID  Panel     Status: None (Preliminary result)   Collection Time: 10/27/18  6:47 PM  Result Value Ref Range Status   Specimen Description BLOOD RIGHT THUMB  Final   Special Requests   Final    AEROBIC BOTTLE ONLY Blood Culture results may not be optimal due to an inadequate volume of blood received in culture bottles   Culture   Final    NO GROWTH 2 DAYS Performed at [facility], [address] [place], [place] [address]    Report Status PENDING  Incomplete         Radiology Studies: No results found.      Scheduled Meds: . artificial tears   Left Eye QHS  . aspirin  325 mg Oral Daily   Or  . aspirin  324 mg Per Tube Daily  . brimonidine  1 drop Left Eye BID  . calcium carbonate  1 tablet Oral BID WC  . clopidogrel  75 mg Oral Daily  . feeding supplement  1 Container Oral TID BM  . polyvinyl alcohol  1 drop Left Eye QID  . scopolamine       Continuous Infusions: . piperacillin-tazobactam (ZOSYN)  IV Stopped (10/29/18 1553)  . [START ON 10/30/2018] vancomycin       LOS: 9 days    Time spent: 28 minutes.     Kathlen ModyVijaya Evgenia Merriman, MD [facility] Pager [phone]  If 7PM-7AM, please contact night-coverage [email or web address] Password [email or web address] 10/29/2018, 6:02 PM

## 2018-10-29 NOTE — Progress Notes (Addendum)
Pharmacy Antibiotic Note  [name] is a 48 y.o. female admitted on 10/19/2018 with cavernous fistula, leukocytosis with unclear etiology.  Pharmacy has been consulted for vancomycin and zosyn dosing for 48 hours prior to procedure. WBC 15.3, afebrile, CrCl 64.45mL/min (Scr 0.58>>0.71).  Plan: Zosyn 3.375g IV q8h given over 4 hours Vancomycin loading dose 1000mg  x1 Vancomycin maintenance dose 1000mg  q24 hours  -Estimated AUC 511 Monitor levels PRN, renal function ,culture data  Height: 5' 7.5" (171.5 cm) Weight: 104 lb 0.9 oz (47.2 kg) IBW/kg (Calculated) : 62.75  Temp (24hrs), Avg:99 F (37.2 C), Min:98.6 F (37 C), Max:99.9 F (37.7 C)  Recent Labs  Lab 10/25/18 0334  10/26/18 0234 10/26/18 1129 10/27/18 0449 10/27/18 1633 10/28/18 1002 10/29/18 0726 10/29/18 0851  WBC  --    < > 14.7*  --  13.6*  --  11.8* 15.0* 15.3*  CREATININE 0.80  --  0.62  --  0.63  --  0.58 0.71  --   LATICACIDVEN  --   --   --  2.9*  --  1.9  --   --   --    < > = values in this interval not displayed.    Estimated Creatinine Clearance: 64.1 mL/min (by C-G formula based on SCr of 0.71 mg/dL).    Allergies  Allergen Reactions  . Guaifenesin Shortness Of Breath  . Iron Anaphylaxis and Other (See Comments)    IV only  . Doxepin Hives  . Pseudoeph-Doxylamine-Dm-Apap Hives  . Pseudoephedrine Hives  . Sulfa Antibiotics Hives  . Hydrocodone-Acetaminophen     UNSPECIFIED REACTION   . Metoprolol Succinate Swelling    UNSPECIFIED EDEMA  . Celexa [Citalopram Hydrobromide] Other (See Comments)    HEADACHE   . Codeine Other (See Comments)    tiredness  . Hydromorphone Hcl Nausea And Vomiting  . Morphine Other (See Comments)    Does not work; hallucinations and irritable  . Pantoprazole Sodium Other (See Comments)    heartburn  . Prednisone Rash  . Tetracyclines & Related Rash   Thank you for allowing pharmacy to be a part of this patient's care.  Wendelyn Breslow, PharmD PGY1 Pharmacy  Resident Phone: [phone] 10/29/2018 11:30 AM

## 2018-10-29 NOTE — Progress Notes (Signed)
Surgery cancelled, Ally PA-C notified patient. Floor nurse called, patient to be  transferred back to floor.

## 2018-10-30 ENCOUNTER — Other Ambulatory Visit: Payer: Self-pay | Admitting: Student

## 2018-10-30 LAB — BASIC METABOLIC PANEL
Anion gap: 10 (ref 5–15)
BUN: 13 mg/dL (ref 6–20)
CO2: 24 mmol/L (ref 22–32)
Calcium: 9.3 mg/dL (ref 8.9–10.3)
Chloride: 100 mmol/L (ref 98–111)
Creatinine, Ser: 0.84 mg/dL (ref 0.44–1.00)
GFR calc Af Amer: >60 mL/min (ref >60)
GFR calc non Af Amer: >60 mL/min (ref >60)
Glucose, Bld: 258 mg/dL — ABNORMAL HIGH (ref 70–99)
Potassium: 2.9 mmol/L — ABNORMAL LOW (ref 3.5–5.1)
Sodium: 134 mmol/L — ABNORMAL LOW (ref 135–145)

## 2018-10-30 LAB — CBC WITH DIFFERENTIAL/PLATELET
Abs Immature Granulocytes: 0.06 10*3/uL (ref 0.00–0.07)
Basophils Absolute: 0 10*3/uL (ref 0.0–0.1)
Basophils Relative: 0 %
Eosinophils Absolute: 0.4 10*3/uL (ref 0.0–0.5)
Eosinophils Relative: 3 %
HCT: 31.6 % — ABNORMAL LOW (ref 36.0–46.0)
Hemoglobin: 10.6 g/dL — ABNORMAL LOW (ref 12.0–15.0)
Immature Granulocytes: 0 %
Lymphocytes Relative: 13 %
Lymphs Abs: 1.8 10*3/uL (ref 0.7–4.0)
MCH: 29.7 pg (ref 26.0–34.0)
MCHC: 33.5 g/dL (ref 30.0–36.0)
MCV: 88.5 fL (ref 80.0–100.0)
Monocytes Absolute: 1.1 10*3/uL — ABNORMAL HIGH (ref 0.1–1.0)
Monocytes Relative: 8 %
Neutro Abs: 10.2 10*3/uL — ABNORMAL HIGH (ref 1.7–7.7)
Neutrophils Relative %: 76 %
Platelets: 336 10*3/uL (ref 150–400)
RBC: 3.57 MIL/uL — ABNORMAL LOW (ref 3.87–5.11)
RDW: 13.8 % (ref 11.5–15.5)
WBC: 13.6 10*3/uL — ABNORMAL HIGH (ref 4.0–10.5)
nRBC: 0 % (ref 0.0–0.2)

## 2018-10-30 MED ORDER — POTASSIUM CHLORIDE CRYS ER 20 MEQ PO TBCR
40.0000 meq | EXTENDED_RELEASE_TABLET | Freq: Two times a day (BID) | ORAL | Status: AC
  Administered 2018-10-30: 40 meq via ORAL
  Filled 2018-10-30: qty 2

## 2018-10-30 NOTE — Progress Notes (Signed)
Chief Complaint: Patient was seen in consultation today for left ICA cavernous fistula/staged embolization.  Referring Physician(s): Etta Quill  Supervising Physician: Luanne Bras  Patient Status: [facility] - In-pt  History of Present Illness: [name] is a 48 y.o. female with a past medical history of hypertension, migraines, pulmonary embolism 2005, DVT 2005, hiatal hernia, GERD, Barrett's esophagus, iron deficiency anemia, vitamin D deficiency, B12 deficiency, gout, anxiety, and depression. She is known to [facility] and has been followed by Dr. Estanislado Pandy since 07/2018. She first presented to our department at the request of Dr. [name] for management of bilateral ICA aneurysms. She consulted with Dr. Estanislado Pandy 08/17/2018 to discuss management options. At that time, patient decided to pursue endovascular intervention for her aneurysms. Unfortunately, due to restrictions regarding COVID-19, this has been postponed.  Patient presented to [facility] ED 10/19/2018 with complaint of tinnitus and left eye pain. 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 At that time, patient decided to pursue endovascular intervention for her left ICA cavernous fistula. She underwent an image-guided cerebral arteriogram with staged embolization of her left ICA cavernous fistula with coiling and stenting 10/23/2018 with Dr. Estanislado Pandy. Patient has remained admitted to [facility] since procedure for observation. Since procedure, it appears that patient's left eye has worsened (worsening erythema/eccymosis, subjective diplopia on lateral gaze, pupil size 4 mm). Patient has been seen and evaluated by Dr. Estanislado Pandy daily who recommends staged embolization of left ICA cavernous fistula tomorrow 10/26/2018. Procedure was scheduled tentatively for Friday  10/26/2018 and Monday 10/29/2018, however both times has been rescheduled due to leukocytosis of unknown etiology. ID has been consulted and patient has began 48 hours of vancomycin + pamperacillin/tazobactam at the request of Dr. Estanislado Pandy prior to moving forward with procedure.  Patient awake and alert laying in bed with no complaints at this time. Denies fever, chills, chest pain, dyspnea, abdominal pain, or headache.  Patient is currently taking Plavix 75 mg once daily and Aspirin 325 mg once daily.   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---   Depression    GERD (gastroesophageal reflux disease)    Hiatal hernia    History of DVT of lower extremity    08-14-2003  RIGHT LOWER EXTREMITY-- TREATED W/ COUMADIN----  per pt none since   History of palpitations    Hypertension    Idiopathic chronic gout    12-20-2017  per pt stable ,   last episode 02/ 2019   Iron deficiency anemia    Migraines    Personal history of PE (pulmonary embolism)    03/ 2005 RIGHT SIDE  --- treated w/ coumadin---  per pt none since   PONV (postoperative nausea and vomiting)    Tinea versicolor    Vitamin D deficiency     Past Surgical History:  Procedure Laterality Date   BIOPSY  11/29/2017   Procedure: BIOPSY;  Surgeon: Clarene Essex, MD;  Location: [facility];  Service: Endoscopy;;   BRONCHOSCOPY  05-20-2004  dr clance   ESOPHAGOGASTRODUODENOSCOPY (EGD) WITH PROPOFOL N/A 11/29/2017   Procedure: ESOPHAGOGASTRODUODENOSCOPY (EGD) WITH PROPOFOL;  Surgeon: Clarene Essex, MD;  Location:  [facility];  Service: Endoscopy;  Laterality: N/A;   EXPLORATORY LAPAROTOMY  AGE 40    [place]   for constipation   INSERTION OF MESH N/A 12/26/2017   Procedure: INSERTION OF BIO A MESH;  Surgeon: Ralene Ok, MD;  Location: [facility];   Service: General;  Laterality: N/A;   IR ANGIO INTRA EXTRACRAN SEL COM CAROTID INNOMINATE BILAT MOD SED  10/20/2018   IR ANGIO INTRA EXTRACRAN SEL INTERNAL CAROTID UNI L MOD SED  10/23/2018   IR ANGIO VERTEBRAL SEL SUBCLAVIAN INNOMINATE UNI R MOD SED  10/20/2018   IR ANGIO VERTEBRAL SEL VERTEBRAL UNI L MOD SED  10/20/2018   IR ANGIOGRAM FOLLOW UP STUDY  10/23/2018   IR TRANSCATH/EMBOLIZ  10/23/2018   KNEE ARTHROSCOPY W/ ACL RECONSTRUCTION Right 09/2003   LAPAROSCOPY BILATERAL TUBAL FULGERATION AND ATTEMPTED THERMACHOICE ABLATION WITH UTERINE PERFORATION  07-17-2009   dr meisinger  [place]   right lower lobe for massive hemoptyosis   MINI-THORACOTOMY WEDGE RESECTION OF LEFT LOWER LOBE CYST Left 06-24-2004   dr Arlyce Dice [facility]   hemoptyosis   RADIOLOGY WITH ANESTHESIA N/A 10/23/2018   Procedure: IR WITH ANESTHESIA FOR EMBOLIZATION;  Surgeon: Luanne Bras, MD;  Location: [place];  Service: Radiology;  Laterality: N/A;    Allergies: Guaifenesin; Iron; Doxepin; Pseudoeph-doxylamine-dm-apap; Pseudoephedrine; Sulfa antibiotics; Hydrocodone-acetaminophen; Metoprolol succinate; Celexa [citalopram hydrobromide]; Codeine; Hydromorphone hcl; Morphine; Pantoprazole sodium; Prednisone; and Tetracyclines & related  Medications: Prior to Admission medications   Medication Sig Start Date End Date Taking? Authorizing Provider  allopurinol (ZYLOPRIM) 100 MG tablet Take 1 tablet (100 mg total) by mouth daily. Must keep visit on 11/03/17 to get refills Patient taking differently: Take 100 mg by mouth daily as needed (for gout flare up). Must keep visit on 11/03/17 to get refills 11/03/17  Yes Plotnikov, Evie Lacks, MD  aspirin-acetaminophen-caffeine (EXCEDRIN MIGRAINE) [phone] MG tablet Take 1 tablet by mouth every 6 (six) hours as needed for headache or migraine.    Yes [provider]  cetirizine (ZYRTEC) 10 MG tablet Take 10 mg by mouth daily as needed for allergies.   Yes [provider]  Cholecalciferol (VITAMIN D3) 2000 units TABS Take 2,000 Units by mouth daily.    Yes [provider]  diazepam (VALIUM) 5 MG tablet TAKE 1 TABLET BY MOUTH EVERY 12 HOURS IF NEEDED FOR ANXIETY Patient taking differently: Take 5 mg by mouth every 12 (twelve) hours as needed for anxiety. take 1 tablet by mouth every 12 hours if needed for anxiety 05/16/18  Yes Plotnikov, Evie Lacks, MD  ferrous gluconate (IRON 27) 240 (27 FE) MG tablet Take 240 mg by mouth daily.    Yes [provider]  hydrochlorothiazide (HYDRODIURIL) 25 MG tablet Take 25 mg by mouth daily.    Yes [provider]  Multiple Vitamins-Minerals (MULTIVITAMIN ADULTS PO) Take 1 tablet by mouth daily.    Yes [provider]  potassium chloride SA (K-DUR,KLOR-CON) 20 MEQ tablet Take 2 tablets (40 mEq total) by mouth every morning. Patient taking differently: Take 20 mEq by mouth daily.  06/16/18  Yes Hongalgi, Lenis Dickinson, MD  promethazine (PHENERGAN) 12.5 MG tablet Take 1 tablet (12.5 mg total) by mouth every 8 (eight) hours as needed for nausea or vomiting. 10/19/18  Yes Hoyt Koch, MD  metroNIDAZOLE (FLAGYL) 500 MG tablet Take 1 tablet (500 mg total) by mouth 2 (two) times daily. Patient not taking: Reported on 07/11/2018 06/21/18   Plotnikov, Evie Lacks,  MD  oseltamivir (TAMIFLU) 75 MG capsule Take 1 capsule (75 mg total) by mouth 2 (two) times daily. Patient not taking: Reported on 10/20/2018 08/17/18   Hoyt Koch, MD     Family History  Problem Relation Age of Onset   Hypertension Other    Diabetes Other     Social History   Socioeconomic History   Marital status: Single    Spouse name: Not on file   Number of children: Not on file   Years of education: Not on file   Highest education level: Not on file  Occupational History   Not on file  Social Needs   Financial resource strain: Not on file   Food insecurity:    Worry: Not on file    Inability:  Not on file   Transportation needs:    Medical: Not on file    Non-medical: Not on file  Tobacco Use   Smoking status: Never Smoker   Smokeless tobacco: Never Used  Substance and Sexual Activity   Alcohol use: Yes    Comment: rare   Drug use: No   Sexual activity: Not on file    Comment: BTL  Lifestyle   Physical activity:    Days per week: Not on file    Minutes per session: Not on file   Stress: Not on file  Relationships   Social connections:    Talks on phone: Not on file    Gets together: Not on file    Attends religious service: Not on file    Active member of club or organization: Not on file    Attends meetings of clubs or organizations: Not on file    Relationship status: Not on file  Other Topics Concern   Not on file  Social History Narrative   Not on file     Review of Systems: A 12 point ROS discussed and pertinent positives are indicated in the HPI above.  All other systems are negative.  Review of Systems  Constitutional: Negative for chills and fever.  Respiratory: Negative for shortness of breath and wheezing.   Cardiovascular: Negative for chest pain and palpitations.  Gastrointestinal: Negative for abdominal pain.  Neurological: Negative for headaches.  Psychiatric/Behavioral: Negative for behavioral problems and confusion.    Vital Signs: BP 132/87    Pulse 69    Temp 97.9 F (36.6 C) (Oral)    Resp 17    Ht 5' 7.5" (1.715 m)    Wt 104 lb 0.9 oz (47.2 kg)    SpO2 100%    BMI 16.06 kg/m   Physical Exam Vitals signs and nursing note reviewed.  Constitutional:      General: She is not in acute distress.    Appearance: Normal appearance.  Eyes:     Comments: Minimal bruising on left cheek. Left eye with ptosis. Left pupil 4 mm and sluggish to react, right pupil 38m and brisk to react.  Cardiovascular:     Rate and Rhythm: Normal rate and regular rhythm.     Heart sounds: Normal heart sounds. No murmur.  Pulmonary:     Effort:  Pulmonary effort is normal. No respiratory distress.     Breath sounds: Normal breath sounds. No wheezing.  Skin:    General: Skin is warm and dry.  Neurological:     Mental Status: She is alert and oriented to person, place, and time.  Psychiatric:        Mood and Affect: Mood normal.  Behavior: Behavior normal.        Thought Content: Thought content normal.        Judgment: Judgment normal.      MD Evaluation Airway: WNL Heart: WNL Abdomen: WNL Chest/ Lungs: WNL ASA  Classification: 3 Mallampati/Airway Score: One   Imaging: Ct Angio Head W Or Wo Contrast  Result Date: 10/26/2018 CLINICAL DATA:  Right-sided paresthesia. Carotid cavernous fistula. Therapy embolization 10/23/2018 EXAM: CT ANGIOGRAPHY HEAD AND NECK TECHNIQUE: Multidetector CT imaging of the head and neck was performed using the standard protocol during bolus administration of intravenous contrast. Multiplanar CT image reconstructions and MIPs were obtained to evaluate the vascular anatomy. Carotid stenosis measurements (when applicable) are obtained utilizing NASCET criteria, using the distal internal carotid diameter as the denominator. CONTRAST:  51m OMNIPAQUE IOHEXOL 350 MG/ML SOLN COMPARISON:  Multiple previous studies including angiography 10/23/2018 and CT 10/20/2018 FINDINGS: CT HEAD FINDINGS Brain: There is streak artifact related to embolic material at the skull base. Allowing for that, the brain has normal appearance without evidence of atrophy, infarction, mass lesion, hemorrhage, hydrocephalus or extra-axial collection. Vascular: Embolic material at the skull base as noted above. Skull: Negative Sinuses: Clear except for some layering fluid in the sphenoid sinus. Orbits: Normal Review of the MIP images confirms the above findings CTA NECK FINDINGS Aortic arch: Normal except for minimal atherosclerotic calcification at the left subclavian artery origin. Right carotid system: No atherosclerotic disease at  the bifurcation. Redemonstration tortuosity and aneurysmal dilatation of the distal right ICA maximal measurements 8 mm. Left carotid system: No atherosclerotic disease at the bifurcation. Redemonstration of tortuosity of the cervical ICA with aneurysmal dilatation up to 12 mm. Vertebral arteries: Normal Skeleton: Mild cervical spondylosis. Other neck: Normal Upper chest: Upper lung emphysema.  No active process. Review of the MIP images confirms the above findings CTA HEAD FINDINGS Anterior circulation: There is extensive artifact at the skull base related to embolic material. There may also be a flow diverting stent. I do not see evidence of distal vessel stenosis or occlusion. Small vessel occlusion could be inapparent. Posterior circulation: Both vertebral arteries are patent to the basilar. No basilar pathology. Posterior circulation branch vessels appear normal. Venous sinuses: Continued prominence of the superior ophthalmic vein on the left, raising the possibility of ongoing fistulous communication. Other venous structures appear normal. Anatomic variants: None significant. Delayed phase: No abnormal enhancement. Review of the MIP images confirms the above findings IMPRESSION: No evidence of large or medium vessel occlusion. 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 Possible flow diverting stent in place. Streak artifact limits precise evaluation in that region. Continued prominence of the superior ophthalmic vein on the left and draining veins in the skull base and upper neck region. This raises the possibility that there could be a degree of ongoing cavernous carotid fistulous communication. 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 Electronically Signed   By: MNelson ChimesM.D.   On:  10/26/2018 10:01   Ct Angio Head W Or Wo Contrast  Result Date: 10/20/2018 CLINICAL DATA:  Initial evaluation for acute headache. EXAM: CT ANGIOGRAPHY HEAD AND NECK TECHNIQUE: Multidetector CT imaging of the head and neck was performed using the standard protocol during bolus administration of intravenous contrast. Multiplanar CT image reconstructions and MIPs were obtained to evaluate the vascular anatomy. Carotid stenosis measurements (when applicable) are  obtained utilizing NASCET criteria, using the distal internal carotid diameter as the denominator. CONTRAST:  157m OMNIPAQUE IOHEXOL 350 MG/ML SOLN COMPARISON:  Prior CTA from 06/29/2018. FINDINGS: CT HEAD FINDINGS Brain: Cerebral volume stable, and within normal limits. No acute intracranial hemorrhage. No acute large vessel territory infarct. No mass lesion, midline shift or mass effect. No hydrocephalus. No extra-axial fluid collection. Vascular: No asymmetric hyperdense vessel. Skull: Scalp soft tissues demonstrate no acute finding. Calvarium intact. Sinuses: Air-fluid level noted within the dominant left sphenoid sinus. Paranasal sinuses are otherwise clear. Mastoid air cells and middle ear cavities are well pneumatized and free of fluid. Soft tissue density within the left EAC likely reflects cerumen. Orbits: Asymmetric soft tissue stranding seen involving the intraconal fat of the left orbit (series 5, image 118). Left lateral rectus muscle appears enlarged, new from previous. Globes remain symmetric in size with normal morphology and appearance. Review of the MIP images confirms the above findings CTA NECK FINDINGS Aortic arch: Visualized aortic arch normal in caliber with normal 3 vessel morphology. Minimal plaque at the origin of the left subclavian artery. No flow-limiting stenosis about the origin of the great vessels. Subclavian arteries otherwise widely patent. Right carotid system: Right common carotid artery widely patent from its origin to  the bifurcation without abnormality or stenosis. No atheromatous narrowing about the right carotid bifurcation. Multiple segments of ectasias again seen involving the mid-distal right ICA measuring up to 9 mm in maximal diameter, similar to previous. Superimposed saccular aneurysm at the distal right ICA just proximal to the skull base relatively stable measuring 8 x 7 x 8 mm (transverse by craniocaudad by AP, series 12, image 111). No dissection or stenosis. Left carotid system: Left common carotid artery patent from its origin to the bifurcation without abnormality. No atheromatous narrowing about the left bifurcation. Aneurysmal dilatation of the distal left ICA up to 12 mm, relatively similar. No dissection or stenosis. Vertebral arteries: Both of the vertebral arteries arise from the subclavian arteries. Left vertebral artery dominant. No evidence for dissection or flow-limiting stenosis. Focal ectasia up at the level of C2 measuring up to 6 mm on the left and 5 mm on the right, stable. Skeleton: Mild multilevel cervical spondylolysis without significant spinal stenosis, stable. Other neck: No other acute soft tissue abnormality within the neck. Few subcentimeter hypodense thyroid nodules noted, of doubtful significance. Upper chest: Visualized upper chest demonstrates no acute finding. Review of the MIP images confirms the above findings CTA HEAD FINDINGS Anterior circulation: Petrous segments patent bilaterally. Cavernous/supraclinoid ICAs patent without flow-limiting stenosis or definite abnormality. Adjacent venous contamination within the cavernous sinus limits evaluation. There is asymmetric engorgement and filling of the left superior orbital vein, raising the possibility for CC fistula (series 12, image 71). Additionally, there is asymmetric filling of the left inferior petrosal sinus. A1 segments patent bilaterally. Normal anterior communicating artery. Anterior cerebral arteries well perfused to their  distal aspects. M1 segments patent bilaterally. Distal MCA branches well perfused and symmetric. Posterior circulation: Left vertebral artery ectatic measuring up to 5 mm, similar to previous. Right vertebral artery diffusely hypoplastic. Posterior inferior cerebral arteries patent proximally. Basilar patent to its distal aspect. Superior cerebral arteries perfuse bilaterally. Both of the PCAs patent to their distal aspects without stenosis. Venous sinuses: Concern for left-sided CC fistula or possibly dural AVF as above. Venous sinuses otherwise grossly patent allowing for timing of the contrast bolus. Anatomic variants: Tiny right posterior communicating artery noted. No other significant anatomic variant. No appreciable  aneurysm. Delayed phase: No abnormal enhancement. Review of the MIP images confirms the above findings IMPRESSION: 1. Abnormal fat stranding involving the intraconal fat of the left orbit with associated extraocular muscle enlargement and engorgement of the left superior orbital vein. Findings concerning for possible cavernous carotid fistula or other arteriovenous shunt about the cavernous sinus. Further assessment with dedicated catheter directed arteriogram recommended for further evaluation. 2. Otherwise stable CTA with multiple segments of ectasia involving the carotid and vertebral arteries bilaterally. 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 Again, findings may reflect fibromuscular dysplasia or underlying connective tissue disorder. 3. No large vessel occlusion, arterial dissection, or hemodynamically significant stenosis identified. 4. No other acute intracranial abnormality. Electronically Signed   By: Jeannine Boga M.D.   On: 10/20/2018 02:29   Ct Head Wo Contrast  Result Date: 10/26/2018 CLINICAL DATA:  Right-sided paresthesia. Carotid cavernous fistula. Therapy embolization 10/23/2018 EXAM: CT  ANGIOGRAPHY HEAD AND NECK TECHNIQUE: Multidetector CT imaging of the head and neck was performed using the standard protocol during bolus administration of intravenous contrast. Multiplanar CT image reconstructions and MIPs were obtained to evaluate the vascular anatomy. Carotid stenosis measurements (when applicable) are obtained utilizing NASCET criteria, using the distal internal carotid diameter as the denominator. CONTRAST:  21m OMNIPAQUE IOHEXOL 350 MG/ML SOLN COMPARISON:  Multiple previous studies including angiography 10/23/2018 and CT 10/20/2018 FINDINGS: CT HEAD FINDINGS Brain: There is streak artifact related to embolic material at the skull base. Allowing for that, the brain has normal appearance without evidence of atrophy, infarction, mass lesion, hemorrhage, hydrocephalus or extra-axial collection. Vascular: Embolic material at the skull base as noted above. Skull: Negative Sinuses: Clear except for some layering fluid in the sphenoid sinus. Orbits: Normal Review of the MIP images confirms the above findings CTA NECK FINDINGS Aortic arch: Normal except for minimal atherosclerotic calcification at the left subclavian artery origin. Right carotid system: No atherosclerotic disease at the bifurcation. Redemonstration tortuosity and aneurysmal dilatation of the distal right ICA maximal measurements 8 mm. Left carotid system: No atherosclerotic disease at the bifurcation. Redemonstration of tortuosity of the cervical ICA with aneurysmal dilatation up to 12 mm. Vertebral arteries: Normal Skeleton: Mild cervical spondylosis. Other neck: Normal Upper chest: Upper lung emphysema.  No active process. Review of the MIP images confirms the above findings CTA HEAD FINDINGS Anterior circulation: There is extensive artifact at the skull base related to embolic material. There may also be a flow diverting stent. I do not see evidence of distal vessel stenosis or occlusion. Small vessel occlusion could be inapparent.  Posterior circulation: Both vertebral arteries are patent to the basilar. No basilar pathology. Posterior circulation branch vessels appear normal. Venous sinuses: Continued prominence of the superior ophthalmic vein on the left, raising the possibility of ongoing fistulous communication. Other venous structures appear normal. Anatomic variants: None significant. Delayed phase: No abnormal enhancement. Review of the MIP images confirms the above findings IMPRESSION: No evidence of large or medium vessel occlusion. 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 Possible flow diverting stent in place. Streak artifact limits precise evaluation in that region. Continued prominence of the superior ophthalmic vein on the left and draining veins in the skull base and upper neck region. This raises the possibility that there could be a degree of ongoing cavernous carotid fistulous communication. Redemonstration of aneurysmal changes of the upper cervical internal carotid arteries  likely secondary to underlying fibromuscular disease or other intimal pathology. Electronically Signed   By: Nelson Chimes M.D.   On: 10/26/2018 10:01   Ct Angio Neck W Or Wo Contrast  Result Date: 10/26/2018 CLINICAL DATA:  Right-sided paresthesia. Carotid cavernous fistula. Therapy embolization 10/23/2018 EXAM: CT ANGIOGRAPHY HEAD AND NECK TECHNIQUE: Multidetector CT imaging of the head and neck was performed using the standard protocol during bolus administration of intravenous contrast. Multiplanar CT image reconstructions and MIPs were obtained to evaluate the vascular anatomy. Carotid stenosis measurements (when applicable) are obtained utilizing NASCET criteria, using the distal internal carotid diameter as the denominator. CONTRAST:  34m OMNIPAQUE IOHEXOL 350 MG/ML SOLN COMPARISON:  Multiple previous studies including angiography 10/23/2018 and CT  10/20/2018 FINDINGS: CT HEAD FINDINGS Brain: There is streak artifact related to embolic material at the skull base. Allowing for that, the brain has normal appearance without evidence of atrophy, infarction, mass lesion, hemorrhage, hydrocephalus or extra-axial collection. Vascular: Embolic material at the skull base as noted above. Skull: Negative Sinuses: Clear except for some layering fluid in the sphenoid sinus. Orbits: Normal Review of the MIP images confirms the above findings CTA NECK FINDINGS Aortic arch: Normal except for minimal atherosclerotic calcification at the left subclavian artery origin. Right carotid system: No atherosclerotic disease at the bifurcation. Redemonstration tortuosity and aneurysmal dilatation of the distal right ICA maximal measurements 8 mm. Left carotid system: No atherosclerotic disease at the bifurcation. Redemonstration of tortuosity of the cervical ICA with aneurysmal dilatation up to 12 mm. Vertebral arteries: Normal Skeleton: Mild cervical spondylosis. Other neck: Normal Upper chest: Upper lung emphysema.  No active process. Review of the MIP images confirms the above findings CTA HEAD FINDINGS Anterior circulation: There is extensive artifact at the skull base related to embolic material. There may also be a flow diverting stent. I do not see evidence of distal vessel stenosis or occlusion. Small vessel occlusion could be inapparent. Posterior circulation: Both vertebral arteries are patent to the basilar. No basilar pathology. Posterior circulation branch vessels appear normal. Venous sinuses: Continued prominence of the superior ophthalmic vein on the left, raising the possibility of ongoing fistulous communication. Other venous structures appear normal. Anatomic variants: None significant. Delayed phase: No abnormal enhancement. Review of the MIP images confirms the above findings IMPRESSION: No evidence of large or medium vessel occlusion. 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 Possible flow diverting stent in place. Streak artifact limits precise evaluation in that region. Continued prominence of the superior ophthalmic vein on the left and draining veins in the skull base and upper neck region. This raises the possibility that there could be a degree of ongoing cavernous carotid fistulous communication. 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 Electronically Signed   By: MNelson ChimesM.D.   On: 10/26/2018 10:01   Ct Angio Neck W And/or Wo Contrast  Result Date: 10/20/2018 CLINICAL DATA:  Initial evaluation for acute headache. EXAM: CT ANGIOGRAPHY HEAD AND NECK TECHNIQUE: Multidetector CT imaging of the head and neck was performed using the standard protocol during bolus administration of intravenous contrast. Multiplanar CT image reconstructions and MIPs were obtained to evaluate the vascular anatomy. Carotid stenosis measurements (when applicable) are obtained utilizing NASCET criteria, using the distal internal carotid diameter as the denominator. CONTRAST:  1065mOMNIPAQUE IOHEXOL 350 MG/ML SOLN COMPARISON:  Prior CTA  from 06/29/2018. FINDINGS: CT HEAD FINDINGS Brain: Cerebral volume stable, and within normal limits. No acute intracranial hemorrhage. No acute large vessel territory infarct. No mass lesion, midline shift or mass effect. No hydrocephalus. No extra-axial fluid collection. Vascular: No asymmetric hyperdense vessel. Skull: Scalp soft tissues demonstrate no acute finding. Calvarium intact. Sinuses: Air-fluid level noted within the dominant left sphenoid sinus. Paranasal sinuses are otherwise clear. Mastoid air cells and middle ear cavities are well pneumatized and free of fluid. Soft tissue density within the left EAC likely reflects  cerumen. Orbits: Asymmetric soft tissue stranding seen involving the intraconal fat of the left orbit (series 5, image 118). Left lateral rectus muscle appears enlarged, new from previous. Globes remain symmetric in size with normal morphology and appearance. Review of the MIP images confirms the above findings CTA NECK FINDINGS Aortic arch: Visualized aortic arch normal in caliber with normal 3 vessel morphology. Minimal plaque at the origin of the left subclavian artery. No flow-limiting stenosis about the origin of the great vessels. Subclavian arteries otherwise widely patent. Right carotid system: Right common carotid artery widely patent from its origin to the bifurcation without abnormality or stenosis. No atheromatous narrowing about the right carotid bifurcation. Multiple segments of ectasias again seen involving the mid-distal right ICA measuring up to 9 mm in maximal diameter, similar to previous. Superimposed saccular aneurysm at the distal right ICA just proximal to the skull base relatively stable measuring 8 x 7 x 8 mm (transverse by craniocaudad by AP, series 12, image 111). No dissection or stenosis. Left carotid system: Left common carotid artery patent from its origin to the bifurcation without abnormality. No atheromatous narrowing about the left bifurcation. Aneurysmal dilatation of the distal left ICA up to 12 mm, relatively similar. No dissection or stenosis. Vertebral arteries: Both of the vertebral arteries arise from the subclavian arteries. Left vertebral artery dominant. No evidence for dissection or flow-limiting stenosis. Focal ectasia up at the level of C2 measuring up to 6 mm on the left and 5 mm on the right, stable. Skeleton: Mild multilevel cervical spondylolysis without significant spinal stenosis, stable. Other neck: No other acute soft tissue abnormality within the neck. Few subcentimeter hypodense thyroid nodules noted, of doubtful significance. Upper chest: Visualized upper  chest demonstrates no acute finding. Review of the MIP images confirms the above findings CTA HEAD FINDINGS Anterior circulation: Petrous segments patent bilaterally. Cavernous/supraclinoid ICAs patent without flow-limiting stenosis or definite abnormality. Adjacent venous contamination within the cavernous sinus limits evaluation. There is asymmetric engorgement and filling of the left superior orbital vein, raising the possibility for CC fistula (series 12, image 71). Additionally, there is asymmetric filling of the left inferior petrosal sinus. A1 segments patent bilaterally. Normal anterior communicating artery. Anterior cerebral arteries well perfused to their distal aspects. M1 segments patent bilaterally. Distal MCA branches well perfused and symmetric. Posterior circulation: Left vertebral artery ectatic measuring up to 5 mm, similar to previous. Right vertebral artery diffusely hypoplastic. Posterior inferior cerebral arteries patent proximally. Basilar patent to its distal aspect. Superior cerebral arteries perfuse bilaterally. Both of the PCAs patent to their distal aspects without stenosis. Venous sinuses: Concern for left-sided CC fistula or possibly dural AVF as above. Venous sinuses otherwise grossly patent allowing for timing of the contrast bolus. Anatomic variants: Tiny right posterior communicating artery noted. No other significant anatomic variant. No appreciable aneurysm. Delayed phase: No abnormal enhancement. Review of the MIP images confirms the above findings IMPRESSION: 1. Abnormal fat stranding involving the intraconal fat of  the left orbit with associated extraocular muscle enlargement and engorgement of the left superior orbital vein. Findings concerning for possible cavernous carotid fistula or other arteriovenous shunt about the cavernous sinus. Further assessment with dedicated catheter directed arteriogram recommended for further evaluation. 2. Otherwise stable CTA with multiple  segments of ectasia involving the carotid and vertebral arteries bilaterally. 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 Again, findings may reflect fibromuscular dysplasia or underlying connective tissue disorder. 3. No large vessel occlusion, arterial dissection, or hemodynamically significant stenosis identified. 4. No other acute intracranial abnormality. Electronically Signed   By: Jeannine Boga M.D.   On: 10/20/2018 02:29   Ir Transcath/emboliz  Result Date: 10/25/2018 CLINICAL DATA:  Left-sided pulsatile tinnitus. Left-sided orbital chemosis, proptosis and ptosis. Abnormal CT angiogram of the head and neck. EXAM: IR ANGIO INTRA EXTRACRAN SEL INTERNAL CAROTID UNI LEFT MOD SED; ARTERIOGRAPHY; BILATERAL COMMON CAROTID AND INNOMINATE ANGIOGRAPHY; TRANSCATHETER THERAPY EMBOLIZATION COMPARISON:  CT angiogram of the head and neck of 10/20/2018. MEDICATIONS: Heparin 1000 units IV; no antibiotic was administered within 1 hour of the procedure. ANESTHESIA/SEDATION: Versed 1 mg IV; Fentanyl 25 mcg IV. Moderate Sedation Time:  30 minutes. The patient was continuously monitored during the procedure by the interventional radiology nurse under my direct supervision. CONTRAST:  Isovue 300 approximately 60 mL. FLUOROSCOPY TIME:  Fluoroscopy Time: 12 minutes 6 seconds (747 mGy). COMPLICATIONS: None immediate. TECHNIQUE: Informed written consent was obtained from the patient after a thorough discussion of the procedural risks, benefits and alternatives. All questions were addressed. Maximal Sterile Barrier Technique was utilized including caps, mask, sterile gowns, sterile gloves, sterile drape, hand hygiene and skin antiseptic. A timeout was performed prior to the initiation of the procedure. The right groin was prepped and draped in the usual sterile fashion. Thereafter using modified Seldinger technique, transfemoral access into the right common  femoral artery was obtained without difficulty. Over a 0.035 inch guidewire, a 5 French Pinnacle sheath was inserted. Through this, and also over 0.035 inch guidewire, a 5 [place] JB 1 catheter was advanced to the aortic arch region and selectively positioned in the right common carotid artery, the right subclavian artery, the left common carotid artery and the left vertebral artery. FINDINGS: Right subclavian arteriogram demonstrates a hypoplastic right vertebral artery. The vertebral artery origin is widely patent. The vessel is seen to opacify to the cranial skull base where it is seen to opacify the right posterior-inferior cerebellar artery and the right vertebrobasilar junction. Flow is also demonstrated into the distal basilar artery with mixing of unopacified blood from the more dominant contralateral left vertebral artery. The right common carotid arteriogram demonstrates the right external carotid artery and its major branches to be widely patent. The right internal carotid artery at the bulb is widely patent. No patency is seen of the proximal 1/3 of the right internal carotid artery. Distal to this there is a fusiform smooth caliber irregularity extending to the cervical petrous junction. At the level of the distal right internal carotid artery cervical segment there is an approximately 12.3 mm x 8.2 mm aneurysm projecting inferiorly and medially. Distal to this the right internal carotid artery demonstrates wide patency. The petrous, the cavernous and the supraclinoid segments are widely patent. The right middle cerebral artery and the right anterior cerebral artery opacify into the capillary and venous phases. There is a simultaneous cross-filling via the anterior communicating artery of the left anterior cerebral artery and the left  middle cerebral artery into the capillary and venous phases. Unopacified blood is seen in the region of the left internal carotid terminus and proximal left middle cerebral  artery. A left common carotid arteriogram demonstrates abnormally prominent left common carotid artery. The left external carotid artery and its major branches are widely patent. The left internal carotid artery at the bulb to the level of the junction of the middle 1/3 of the left internal carotid artery is widely patent. Arising at the junction of the distal 1/3 and the middle 1/3 of the left internal carotid artery is a 16.2 mm x 11.5 mm saccular aneurysm projecting posteriorly. Distal to this the left internal carotid artery assumes a normal caliber to the petrous segment. At the level of the caval cavernous segment of the left internal carotid artery saccular pouch is noted with immediate egress of contrast posteriorly and also medially inferiorly and also crossing the midline across the intra cavernous tributaries opacifying the contralateral right cavernous sinus and subsequently the inferior petrosal sinus on the right side. Also demonstrated is the abnormally prominent superior petrosal sinus draining into the junction of the transverse and the left sigmoid sinus. There is spontaneous fast flow egress also inferiorly and laterally into the pterygoid plexus, and also the left inferior petrosal sinus. Also noted is abnormal prominence of the superior ophthalmic vein, and the inferior ophthalmic vein projecting anteriorly and laterally and superiorly. Distal to the caval cavernous segment, the distal cavernous and the supraclinoid segments are widely patent. However, there is significant diminution of blood supply to the left middle cerebral artery. No visible opacification of the left anterior cerebral artery is noted from the left internal carotid artery injection. Origin of the dominant left vertebral artery is widely patent. The vessel is seen to opacify to the cranial skull base. At the level of the first horizontal segment there is a 6.1 mm x 4.2 mm saccular aneurysm. Distal to this the left  vertebrobasilar junction and the left posterior-inferior cerebellar artery demonstrate wide patency. The basilar artery, the posterior cerebral arteries, the superior cerebellar arteries and the anterior-inferior cerebellar arteries opacify into the capillary and venous phases. No retrograde opacification of the posterior communicating artery is seen from the left vertebral artery injection. 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 Extensive shunting of blood from the left internal carotid artery intracranially by the large fistulous communications. Cross-filling via the anterior communicating artery of the left middle cerebral artery and the left anterior cerebral artery from the right-sided internal carotid artery injection. 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 Approximately 12.3 mm x 8.2 mm saccular aneurysm arising in the posterior wall of the right internal carotid artery and distal cervical segment. PLAN: Findings reviewed with the patient and her sister. 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 Electronically Signed   By: Luanne Bras M.D.   On: 10/24/2018 15:04   Ir Angiogram Follow Up Study  Result Date: 10/25/2018 CLINICAL DATA:  Left-sided pulsatile tinnitus. Left-sided orbital chemosis, proptosis and ptosis. Abnormal  CT angiogram of the head and neck. EXAM: IR ANGIO INTRA EXTRACRAN SEL INTERNAL CAROTID UNI LEFT MOD SED; ARTERIOGRAPHY;  BILATERAL COMMON CAROTID AND INNOMINATE ANGIOGRAPHY; TRANSCATHETER THERAPY EMBOLIZATION COMPARISON:  CT angiogram of the head and neck of 10/20/2018. MEDICATIONS: Heparin 1000 units IV; no antibiotic was administered within 1 hour of the procedure. ANESTHESIA/SEDATION: Versed 1 mg IV; Fentanyl 25 mcg IV. Moderate Sedation Time:  30 minutes. The patient was continuously monitored during the procedure by the interventional radiology nurse under my direct supervision. CONTRAST:  Isovue 300 approximately 60 mL. FLUOROSCOPY TIME:  Fluoroscopy Time: 12 minutes 6 seconds (747 mGy). COMPLICATIONS: None immediate. TECHNIQUE: Informed written consent was obtained from the patient after a thorough discussion of the procedural risks, benefits and alternatives. All questions were addressed. Maximal Sterile Barrier Technique was utilized including caps, mask, sterile gowns, sterile gloves, sterile drape, hand hygiene and skin antiseptic. A timeout was performed prior to the initiation of the procedure. The right groin was prepped and draped in the usual sterile fashion. Thereafter using modified Seldinger technique, transfemoral access into the right common femoral artery was obtained without difficulty. Over a 0.035 inch guidewire, a 5 French Pinnacle sheath was inserted. Through this, and also over 0.035 inch guidewire, a 5 [place] JB 1 catheter was advanced to the aortic arch region and selectively positioned in the right common carotid artery, the right subclavian artery, the left common carotid artery and the left vertebral artery. FINDINGS: Right subclavian arteriogram demonstrates a hypoplastic right vertebral artery. The vertebral artery origin is widely patent. The vessel is seen to opacify to the cranial skull base where it is seen to opacify the right posterior-inferior cerebellar artery and the right vertebrobasilar junction. Flow is also demonstrated into the distal basilar artery with mixing of unopacified blood from  the more dominant contralateral left vertebral artery. The right common carotid arteriogram demonstrates the right external carotid artery and its major branches to be widely patent. The right internal carotid artery at the bulb is widely patent. No patency is seen of the proximal 1/3 of the right internal carotid artery. Distal to this there is a fusiform smooth caliber irregularity extending to the cervical petrous junction. At the level of the distal right internal carotid artery cervical segment there is an approximately 12.3 mm x 8.2 mm aneurysm projecting inferiorly and medially. Distal to this the right internal carotid artery demonstrates wide patency. The petrous, the cavernous and the supraclinoid segments are widely patent. The right middle cerebral artery and the right anterior cerebral artery opacify into the capillary and venous phases. There is a simultaneous cross-filling via the anterior communicating artery of the left anterior cerebral artery and the left middle cerebral artery into the capillary and venous phases. Unopacified blood is seen in the region of the left internal carotid terminus and proximal left middle cerebral artery. A left common carotid arteriogram demonstrates abnormally prominent left common carotid artery. The left external carotid artery and its major branches are widely patent. The left internal carotid artery at the bulb to the level of the junction of the middle 1/3 of the left internal carotid artery is widely patent. Arising at the junction of the distal 1/3 and the middle 1/3 of the left internal carotid artery is a 16.2 mm x 11.5 mm saccular aneurysm projecting posteriorly. Distal to this the left internal carotid artery assumes a normal caliber to the petrous segment. At the level of the caval cavernous segment of the left internal carotid artery saccular  pouch is noted with immediate egress of contrast posteriorly and also medially inferiorly and also crossing the  midline across the intra cavernous tributaries opacifying the contralateral right cavernous sinus and subsequently the inferior petrosal sinus on the right side. Also demonstrated is the abnormally prominent superior petrosal sinus draining into the junction of the transverse and the left sigmoid sinus. There is spontaneous fast flow egress also inferiorly and laterally into the pterygoid plexus, and also the left inferior petrosal sinus. Also noted is abnormal prominence of the superior ophthalmic vein, and the inferior ophthalmic vein projecting anteriorly and laterally and superiorly. Distal to the caval cavernous segment, the distal cavernous and the supraclinoid segments are widely patent. However, there is significant diminution of blood supply to the left middle cerebral artery. No visible opacification of the left anterior cerebral artery is noted from the left internal carotid artery injection. Origin of the dominant left vertebral artery is widely patent. The vessel is seen to opacify to the cranial skull base. At the level of the first horizontal segment there is a 6.1 mm x 4.2 mm saccular aneurysm. Distal to this the left vertebrobasilar junction and the left posterior-inferior cerebellar artery demonstrate wide patency. The basilar artery, the posterior cerebral arteries, the superior cerebellar arteries and the anterior-inferior cerebellar arteries opacify into the capillary and venous phases. No retrograde opacification of the posterior communicating artery is seen from the left vertebral artery injection. 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 Extensive  shunting of blood from the left internal carotid artery intracranially by the large fistulous communications. Cross-filling via the anterior communicating artery of the left middle cerebral artery and the left anterior cerebral artery from the right-sided internal carotid artery injection. 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 Approximately 12.3 mm x 8.2 mm saccular aneurysm arising in the posterior wall of the right internal carotid artery and distal cervical segment. PLAN: Findings reviewed with the patient and her sister. 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 Electronically Signed   By: Luanne Bras M.D.   On: 10/24/2018 15:04   Dg Chest Port 1 View  Result Date: 10/26/2018 CLINICAL DATA:  Elevated white blood cell count. EXAM: PORTABLE CHEST 1 VIEW COMPARISON:  Single-view of the chest 10/20/2018. PA and lateral chest and CT chest 07/11/2018. FINDINGS: The patient has very small bilateral pleural effusions. Lungs are clear. Heart size is normal. No pneumothorax. No acute bony abnormality. IMPRESSION: Small bilateral pleural effusions.  Lungs are clear. Electronically Signed   By: Inge Rise M.D.   On: 10/26/2018 14:25   Dg Chest Port 1 View  Result Date: 10/20/2018 CLINICAL DATA:  Hypercalcemia. EXAM: PORTABLE CHEST 1 VIEW COMPARISON:  CT chest and chest x-ray dated July 11, 2018. FINDINGS: The heart size and mediastinal contours are within normal limits. Normal pulmonary vascularity. Chronic pleural thickening at the bilateral costophrenic angles is unchanged. No focal consolidation, pleural effusion, or pneumothorax. No acute osseous abnormality. IMPRESSION: No active disease. Electronically Signed   By: Orville Govern.D.  On: 10/20/2018 05:55   Dg Abd 2  Views  Result Date: 10/26/2018 CLINICAL DATA:  Nausea and vomiting EXAM: ABDOMEN - 2 VIEW COMPARISON:  None. FINDINGS: Ovoid density in the pelvis attributed to full urinary bladder. The kidneys also are still excreting contrast. Normal bowel gas pattern. No pneumoperitoneum. IMPRESSION: 1. Distended urinary bladder. 2. Normal bowel gas pattern. Electronically Signed   By: Monte Fantasia M.D.   On: 10/26/2018 10:12   Vas [place] Upper Extremity Venous Duplex  Result Date: 10/28/2018 UPPER VENOUS STUDY  Indications: bruising Performing Technologist: June Leap RDMS, RVT  Examination Guidelines: A complete evaluation includes B-mode imaging, spectral Doppler, color Doppler, and power Doppler as needed of all accessible portions of each vessel. Bilateral testing is considered an integral part of a complete examination. Limited examinations for reoccurring indications may be performed as noted.  Right Findings: +----------+------------+---------+-----------+----------+-------+  RIGHT      Compressible Phasicity Spontaneous Properties Summary  +----------+------------+---------+-----------+----------+-------+  Subclavian                 Yes        Yes                         +----------+------------+---------+-----------+----------+-------+  Left Findings: +----------+------------+---------+-----------+----------+-------+  LEFT       Compressible Phasicity Spontaneous Properties Summary  +----------+------------+---------+-----------+----------+-------+  IJV            Full        Yes        Yes                         +----------+------------+---------+-----------+----------+-------+  Subclavian     Full        Yes        Yes                         +----------+------------+---------+-----------+----------+-------+  Axillary       Full        Yes        Yes                         +----------+------------+---------+-----------+----------+-------+  Brachial       Full        Yes        Yes                          +----------+------------+---------+-----------+----------+-------+  Radial         Full                                               +----------+------------+---------+-----------+----------+-------+  Ulnar          Full                                               +----------+------------+---------+-----------+----------+-------+  Cephalic       Full                                               +----------+------------+---------+-----------+----------+-------+  Basilic        Full                                               +----------+------------+---------+-----------+----------+-------+  Summary:  Right: No evidence of thrombosis in the subclavian.  Left: No evidence of deep vein thrombosis in the upper extremity. No evidence of superficial vein thrombosis in the upper extremity.  *See table(s) above for measurements and observations.  Diagnosing physician: Harold Barban MD Electronically signed by Harold Barban MD on 10/28/2018 at 2:19:20 PM.    Final    Ir Angio Intra Extracran Sel Com Carotid Innominate Bilat Mod Sed  Result Date: 10/25/2018 CLINICAL DATA:  Left-sided pulsatile tinnitus. Left-sided orbital chemosis, proptosis and ptosis. Abnormal CT angiogram of the head and neck. EXAM: IR ANGIO INTRA EXTRACRAN SEL INTERNAL CAROTID UNI LEFT MOD SED; ARTERIOGRAPHY; BILATERAL COMMON CAROTID AND INNOMINATE ANGIOGRAPHY; TRANSCATHETER THERAPY EMBOLIZATION COMPARISON:  CT angiogram of the head and neck of 10/20/2018. MEDICATIONS: Heparin 1000 units IV; no antibiotic was administered within 1 hour of the procedure. ANESTHESIA/SEDATION: Versed 1 mg IV; Fentanyl 25 mcg IV. Moderate Sedation Time:  30 minutes. The patient was continuously monitored during the procedure by the interventional radiology nurse under my direct supervision. CONTRAST:  Isovue 300 approximately 60 mL. FLUOROSCOPY TIME:  Fluoroscopy Time: 12 minutes 6 seconds (747 mGy). COMPLICATIONS: None immediate. TECHNIQUE: Informed written consent was  obtained from the patient after a thorough discussion of the procedural risks, benefits and alternatives. All questions were addressed. Maximal Sterile Barrier Technique was utilized including caps, mask, sterile gowns, sterile gloves, sterile drape, hand hygiene and skin antiseptic. A timeout was performed prior to the initiation of the procedure. The right groin was prepped and draped in the usual sterile fashion. Thereafter using modified Seldinger technique, transfemoral access into the right common femoral artery was obtained without difficulty. Over a 0.035 inch guidewire, a 5 French Pinnacle sheath was inserted. Through this, and also over 0.035 inch guidewire, a 5 [place] JB 1 catheter was advanced to the aortic arch region and selectively positioned in the right common carotid artery, the right subclavian artery, the left common carotid artery and the left vertebral artery. FINDINGS: Right subclavian arteriogram demonstrates a hypoplastic right vertebral artery. The vertebral artery origin is widely patent. The vessel is seen to opacify to the cranial skull base where it is seen to opacify the right posterior-inferior cerebellar artery and the right vertebrobasilar junction. Flow is also demonstrated into the distal basilar artery with mixing of unopacified blood from the more dominant contralateral left vertebral artery. The right common carotid arteriogram demonstrates the right external carotid artery and its major branches to be widely patent. The right internal carotid artery at the bulb is widely patent. No patency is seen of the proximal 1/3 of the right internal carotid artery. Distal to this there is a fusiform smooth caliber irregularity extending to the cervical petrous junction. At the level of the distal right internal carotid artery cervical segment there is an approximately 12.3 mm x 8.2 mm aneurysm projecting inferiorly and medially. Distal to this the right internal carotid artery  demonstrates wide patency. The petrous, the cavernous and the supraclinoid segments are widely patent. The right middle cerebral artery and the right anterior cerebral artery opacify into the capillary and venous phases. There is a simultaneous cross-filling via the anterior communicating  artery of the left anterior cerebral artery and the left middle cerebral artery into the capillary and venous phases. Unopacified blood is seen in the region of the left internal carotid terminus and proximal left middle cerebral artery. A left common carotid arteriogram demonstrates abnormally prominent left common carotid artery. The left external carotid artery and its major branches are widely patent. The left internal carotid artery at the bulb to the level of the junction of the middle 1/3 of the left internal carotid artery is widely patent. Arising at the junction of the distal 1/3 and the middle 1/3 of the left internal carotid artery is a 16.2 mm x 11.5 mm saccular aneurysm projecting posteriorly. Distal to this the left internal carotid artery assumes a normal caliber to the petrous segment. At the level of the caval cavernous segment of the left internal carotid artery saccular pouch is noted with immediate egress of contrast posteriorly and also medially inferiorly and also crossing the midline across the intra cavernous tributaries opacifying the contralateral right cavernous sinus and subsequently the inferior petrosal sinus on the right side. Also demonstrated is the abnormally prominent superior petrosal sinus draining into the junction of the transverse and the left sigmoid sinus. There is spontaneous fast flow egress also inferiorly and laterally into the pterygoid plexus, and also the left inferior petrosal sinus. Also noted is abnormal prominence of the superior ophthalmic vein, and the inferior ophthalmic vein projecting anteriorly and laterally and superiorly. Distal to the caval cavernous segment, the distal  cavernous and the supraclinoid segments are widely patent. However, there is significant diminution of blood supply to the left middle cerebral artery. No visible opacification of the left anterior cerebral artery is noted from the left internal carotid artery injection. Origin of the dominant left vertebral artery is widely patent. The vessel is seen to opacify to the cranial skull base. At the level of the first horizontal segment there is a 6.1 mm x 4.2 mm saccular aneurysm. Distal to this the left vertebrobasilar junction and the left posterior-inferior cerebellar artery demonstrate wide patency. The basilar artery, the posterior cerebral arteries, the superior cerebellar arteries and the anterior-inferior cerebellar arteries opacify into the capillary and venous phases. No retrograde opacification of the posterior communicating artery is seen from the left vertebral artery injection. 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 Extensive shunting of blood from the left internal carotid artery intracranially by the large fistulous communications. Cross-filling via the anterior communicating artery of the left middle cerebral artery and the left anterior cerebral artery from the right-sided internal carotid artery injection. 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 Approximately 12.3 mm x 8.2 mm saccular aneurysm arising in the posterior wall of the right internal carotid artery and distal cervical segment. PLAN: Findings reviewed with the patient and her  sister. 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 Electronically Signed   By: Luanne Bras M.D.   On: 10/24/2018 15:04   Ir Angio Intra Extracran Sel Internal Carotid Uni L Mod Sed  Result Date: 10/25/2018 CLINICAL DATA:  Left-sided pulsatile tinnitus. Left-sided orbital chemosis, proptosis and ptosis. Abnormal CT angiogram of the head and neck. EXAM: IR ANGIO INTRA EXTRACRAN SEL INTERNAL CAROTID UNI LEFT MOD SED; ARTERIOGRAPHY; BILATERAL COMMON CAROTID AND INNOMINATE ANGIOGRAPHY; TRANSCATHETER THERAPY EMBOLIZATION COMPARISON:  CT angiogram of the head and neck of 10/20/2018. MEDICATIONS: Heparin 1000 units IV; no antibiotic was administered within 1 hour of the procedure. ANESTHESIA/SEDATION: Versed 1 mg IV; Fentanyl 25 mcg IV. Moderate Sedation Time:  30 minutes. The patient was continuously monitored during the procedure by the interventional radiology nurse under my direct supervision. CONTRAST:  Isovue 300 approximately 60 mL. FLUOROSCOPY TIME:  Fluoroscopy Time: 12 minutes 6 seconds (747 mGy). COMPLICATIONS: None immediate. TECHNIQUE: Informed written consent was obtained from the patient after a thorough discussion of the procedural risks, benefits and alternatives. All questions were addressed. Maximal Sterile Barrier Technique was utilized including caps, mask, sterile gowns, sterile gloves, sterile drape, hand hygiene and skin antiseptic. A timeout was performed prior to the initiation of the procedure. The right groin was prepped and draped in the usual sterile fashion. Thereafter using modified Seldinger technique, transfemoral access into the right common femoral artery was obtained without difficulty. Over a 0.035 inch guidewire, a 5 French Pinnacle sheath was inserted. Through this, and also over 0.035 inch guidewire, a 5 [place] JB 1 catheter was advanced to the aortic arch region and selectively  positioned in the right common carotid artery, the right subclavian artery, the left common carotid artery and the left vertebral artery. FINDINGS: Right subclavian arteriogram demonstrates a hypoplastic right vertebral artery. The vertebral artery origin is widely patent. The vessel is seen to opacify to the cranial skull base where it is seen to opacify the right posterior-inferior cerebellar artery and the right vertebrobasilar junction. Flow is also demonstrated into the distal basilar artery with mixing of unopacified blood from the more dominant contralateral left vertebral artery. The right common carotid arteriogram demonstrates the right external carotid artery and its major branches to be widely patent. The right internal carotid artery at the bulb is widely patent. No patency is seen of the proximal 1/3 of the right internal carotid artery. Distal to this there is a fusiform smooth caliber irregularity extending to the cervical petrous junction. At the level of the distal right internal carotid artery cervical segment there is an approximately 12.3 mm x 8.2 mm aneurysm projecting inferiorly and medially. Distal to this the right internal carotid artery demonstrates wide patency. The petrous, the cavernous and the supraclinoid segments are widely patent. The right middle cerebral artery and the right anterior cerebral artery opacify into the capillary and venous phases. There is a simultaneous cross-filling via the anterior communicating artery of the left anterior cerebral artery and the left middle cerebral artery into the capillary and venous phases. Unopacified blood is seen in the region of the left internal carotid terminus and proximal left middle cerebral artery. A left common carotid arteriogram demonstrates abnormally prominent left common carotid artery. The left external carotid artery and its major branches are widely patent. The left internal carotid artery at the bulb to the level of the  junction of the middle 1/3 of the left internal carotid artery is widely patent. Arising at the junction of the distal 1/3 and the middle 1/3 of the left internal carotid artery is a 16.2 mm x 11.5 mm saccular aneurysm projecting posteriorly. Distal to this the left internal carotid artery assumes a normal caliber to the petrous  segment. At the level of the caval cavernous segment of the left internal carotid artery saccular pouch is noted with immediate egress of contrast posteriorly and also medially inferiorly and also crossing the midline across the intra cavernous tributaries opacifying the contralateral right cavernous sinus and subsequently the inferior petrosal sinus on the right side. Also demonstrated is the abnormally prominent superior petrosal sinus draining into the junction of the transverse and the left sigmoid sinus. There is spontaneous fast flow egress also inferiorly and laterally into the pterygoid plexus, and also the left inferior petrosal sinus. Also noted is abnormal prominence of the superior ophthalmic vein, and the inferior ophthalmic vein projecting anteriorly and laterally and superiorly. Distal to the caval cavernous segment, the distal cavernous and the supraclinoid segments are widely patent. However, there is significant diminution of blood supply to the left middle cerebral artery. No visible opacification of the left anterior cerebral artery is noted from the left internal carotid artery injection. Origin of the dominant left vertebral artery is widely patent. The vessel is seen to opacify to the cranial skull base. At the level of the first horizontal segment there is a 6.1 mm x 4.2 mm saccular aneurysm. Distal to this the left vertebrobasilar junction and the left posterior-inferior cerebellar artery demonstrate wide patency. The basilar artery, the posterior cerebral arteries, the superior cerebellar arteries and the anterior-inferior cerebellar arteries opacify into the  capillary and venous phases. No retrograde opacification of the posterior communicating artery is seen from the left vertebral artery injection. 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 Extensive shunting of blood from the left internal carotid artery intracranially by the large fistulous communications. Cross-filling via the anterior communicating artery of the left middle cerebral artery and the left anterior cerebral artery from the right-sided internal carotid artery injection. 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 Approximately 12.3 mm x 8.2 mm saccular aneurysm arising in the posterior wall of the right internal carotid artery and distal cervical segment. PLAN: Findings reviewed with the patient and her sister. 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 Electronically Signed   By: Luanne Bras M.D.   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  EXAM: ENDOVASCULAR STAGED EMBOLIZATION OF LEFT INTERNAL CAROTID ARTERY [facility] DIRECT FISTULA.  COMPARISON:  Diagnostic catheter arteriogram of 10/20/2018.  MEDICATIONS: Ancef 2 g IV was administered within 1 hour of the procedure.  ANESTHESIA/SEDATION: General anesthesia.  CONTRAST:  Isovue 300 approximately 120 mL.  FLUOROSCOPY TIME:  Fluoroscopy Time: 124 minutes 12  seconds (3473 mGy).  COMPLICATIONS: None immediate.  TECHNIQUE: Informed written consent was obtained from the patient after a thorough discussion of the procedural risks, benefits and alternatives. All questions were addressed. Maximal Sterile Barrier Technique was utilized including caps, mask, sterile gowns, sterile gloves, sterile drape, hand hygiene and skin antiseptic. A timeout was performed prior to the initiation of the procedure.  The patient  was then put under general anesthesia by the Department of Anesthesiology. The right groin was prepped and draped in the usual sterile fashion. Therefore, using modified Seldinger technique trans femoral access in the right common femoral artery was obtained without difficulty. Over a 0.035 inch guidewire a 5 French Pinnacle sheath was inserted. Over a 0.035 inch guidewire and a 5 [place] JB 1 catheter was advanced to the aortic arch region and selectively positioned in the right common carotid artery and the left common carotid artery.  FINDINGS: Right common carotid arteriogram again demonstrates the right external carotid artery and their branches to be widely patent.  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  The petrous, the cavernous and the supraclinoid segments are widely patent.  The right middle cerebral artery and the right anterior cerebral artery opacify into the capillary and venous phases. There is prompt filling of the anterior communicating artery of the left anterior cerebral artery, and the left middle cerebral artery into the capillary and venous phases.  The left common carotid arteriogram demonstrates the left external carotid artery and its major branches to be widely patent. The left internal carotid at the bulb again to the cranial skull base demonstrates wide patency with the large laterally positioned aneurysm unchanged at the level of the junction of the  distal and the middle 1/3 of the left internal carotid artery.  Distal to this the patency of the left internal carotid artery in the petrous segment is noted.  In the caval cavernous segment again demonstrated is the large fast flow shunting of blood into the left transverse sinus and via the intra cavernous tributaries into the right cavernous sinus and the right inferior petrosal sinus.  Fast flow is noted into the enlarged superior petrosal sinus, the left superior ophthalmic vein, the inferior ophthalmic vein, the pterygoid plexus and the left inferior petrosal sinus.  Partial flow is noted into the normal appearing distal cavernous and the left supraclinoid segments.  Partial opacification is noted of the left middle cerebral artery branches with mixing of unopacified blood from the contralateral right internal carotid artery via the anterior communicating artery.  PROCEDURE: The diagnostic JB 1 catheter in the left common carotid artery was exchanged over a 0.035 inch 300 cm Rosen exchange guidewire for an 85 cm 8 [place] Neuron Max guide catheter.  The guidewire was removed. Good aspiration obtained from the hub of the Neuron Max sheath. Gentle control arteriogram demonstrated no evidence of spasms, dissections or of intraluminal filling defects. This was then connected to continuous heparinized saline infusion.  Over a 0.035 inch Roadrunner guidewire, using biplane roadmap technique and constant fluoroscopic guidance, a 7 French 132 cm Catalyst guide catheter was then advanced without difficulty into the horizontal petrous segment of the left internal carotid artery, the guidewire was removed. Good aspiration obtained from the hub of the 7 [place] Catalyst guide catheter. A control arteriogram performed through this again demonstrated the large fast flow left internal carotid artery directed CC fistula.  Using biplane roadmap technique and constant fluoroscopic guidance over a 0.014 inch Softip  Synchro micro guidewire, a 4 mm x 11 mm extra compliant Scepter balloon which had been prepped with evacuation of air from the balloon, was advanced and positioned with its distal marker into the distal cavernous segment of the left internal carotid artery. This was then left with the micro guidewire in the supraclinoid left ICA.  Through a second  by bore of a double Tuohy Borst, over a 0.014 inch Asahi micro guidewire, a 014 Echelon microcatheter was then advanced into the proximal cavernous segment of the left internal carotid artery.  Using a torque device, the micro guidewire was then gently manipulated and access was obtained into the posterior aspect of the left cavernous sinus at the origin of the superior petrosal sinus.  The guidewire was removed. Good aspiration was obtained from the hub of the Echelon microcatheter. A gentle control arteriogram performed demonstrated intra cavernous positioning of the tip of the microcatheter.  Thereafter using intermittent biplane roadmap technique and constant fluoroscopic guidance, the following coils were then advanced into the cavernous sinus. A 7 mm x 20 cm Target XL coil, a 7 mm x 15 cm Target 360 soft coil, a 6 mm x 20 cm Target XL 360 soft coil, a 6 mm x 20 cm Target 360 soft coil, a 6 mm x 15 cm Target 360 soft coil, a 6 mm x 20 cm 360 standard Target coil, a Codman neuro stretch resistant 6 mm x 15 cm coil, and a 5 mm x 15 cm Target 360 soft coil. Each coil was advanced into the cavernous sinus using biplane roadmap technique and constant fluoroscopic guidance. Prior to the detachment of each coil, a control arteriogram was performed ensuring safe positioning of the coil. Also each coil was advanced into the cavernous sinus with the Scepter C balloon inflated with 50% contrast and 50% heparinized saline infusion using a 1 mL syringe.  Following the final coil, placement of subsequent coils was met with the coils extending into the distal cavernous segment  of the left internal carotid artery.  A control arteriogram performed through the Neuron Max sheath in the left internal carotid artery continued to demonstrate egress through the pterygoid plexus on the left side and right side, into the right cavernous sinus via the intra cavernous tributaries, and also the superior and the inferior superior ophthalmic veins. There continued to be flow into the left internal carotid artery. Access into the supraclinoid left ICA and the left middle cerebral artery was then obtained using a Headway 17 2 tip microcatheter over a 0.014 inch standard Synchro micro guidewire.  It was decided to place a 4.5 mm x 30 mm Atlas Neuroform stent in order to provide pathway for antegrade flow into the internal carotid artery intracranially. Also to provide a platform for reconstruction and possible further endovascular coiling in order to treat and eliminate or significant slow down flow through the left internal carotid artery direct CC fistula.  This stent was then deployed without difficulty with stable anchoring in the supraclinoid left ICA, and also the petrous cavernous segment of the left internal carotid artery.  A final control arteriogram performed through the 6 [place] Catalyst guide catheter in the horizontal petrous segment demonstrated significant reduced flow through the posterior half of the left cavernous sinus, and also the superolateral aspect of the left cavernous sinus. There continued to be significant shunting of blood into the bilateral inferior petrosal sinuses, the left pterygoid plexus and the left superior and inferior ophthalmic veins.  Catalyst guide catheter, and the Neuron Max sheath were then retrieved into the abdominal aorta and removed with hemostasis obtained in the right groin puncture site with manual compression. The right groin appeared soft without evidence of a hematoma or bleeding.  Distal pulses remained palpable in the dorsalis pedis, and the  posterior tibial regions bilaterally at the end of the procedure.  Throughout the procedure, the patient's blood pressure and neurological status remained stable.  Patient was then extubated without difficulty. Upon recovery, the patient demonstrated no new neurological signs or symptoms.  She continued to have chemosis with sluggish reaction of the left pupil. 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  Otherwise, neurologically, the patient remained stable without complaints of headaches, nausea or vomiting.  She was then transferred to the neuro ICU to continue on low-dose IV heparin. She was to continue with regular aspirin for the time being.  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  PLAN: Contemplate scheduling the second stage of the embolization in next 1-2 weeks depending on the patient's clinical condition. This was discussed with the patient, and the patient's sister.   Electronically Signed   By: Luanne Bras M.D.   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 EXAM: ENDOVASCULAR STAGED EMBOLIZATION OF LEFT INTERNAL CAROTID ARTERY [facility] DIRECT FISTULA. COMPARISON:  Diagnostic catheter arteriogram of 10/20/2018. MEDICATIONS: Ancef 2 g IV was administered within 1 hour of the procedure. ANESTHESIA/SEDATION: General anesthesia. CONTRAST:  Isovue 300 approximately 120 mL. FLUOROSCOPY TIME:  Fluoroscopy Time: 124 minutes 12 seconds (3473 mGy). COMPLICATIONS: None immediate. TECHNIQUE: Informed written consent was obtained from the patient after a thorough  discussion of the procedural risks, benefits and alternatives. All questions were addressed. Maximal Sterile Barrier Technique was utilized including caps, mask, sterile gowns, sterile gloves, sterile drape, hand hygiene and skin antiseptic. A timeout was performed prior to the initiation of the procedure. The patient was then put under general anesthesia by the Department of Anesthesiology. The right groin was prepped and draped in the usual sterile fashion. Therefore, using modified Seldinger technique trans femoral access in the right common femoral artery was obtained without difficulty. Over a 0.035 inch guidewire a 5 French Pinnacle sheath was inserted. Over a 0.035 inch guidewire and a 5 [place] JB 1 catheter was advanced to the aortic arch region and selectively positioned in the right common carotid artery and the left common carotid artery. FINDINGS: Right common carotid arteriogram again demonstrates the right external carotid artery and their branches to be widely patent. 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 The petrous, the cavernous and the supraclinoid segments are widely patent. The right middle cerebral artery and the right anterior cerebral artery opacify into the capillary and venous phases. There is prompt filling of the anterior communicating artery of the left anterior cerebral artery, and the left middle cerebral artery into the capillary and venous phases. The left common carotid arteriogram demonstrates the left external carotid artery and its major branches to be widely patent. The left internal carotid at the bulb again to the cranial skull base demonstrates wide patency with the large laterally positioned aneurysm unchanged at the level of the junction of the distal and the middle 1/3 of the left internal carotid artery. Distal to this the patency of the left internal carotid artery in the petrous segment is  noted. In the caval cavernous segment again demonstrated is the large fast flow shunting  of blood into the left transverse sinus and via the intra cavernous tributaries into the right cavernous sinus and the right inferior petrosal sinus. Fast flow is noted into the enlarged superior petrosal sinus, the left superior ophthalmic vein, the inferior ophthalmic vein, the pterygoid plexus and the left inferior petrosal sinus. Partial flow is noted into the normal appearing distal cavernous and the left supraclinoid segments. Partial opacification is noted of the left middle cerebral artery branches with mixing of unopacified blood from the contralateral right internal carotid artery via the anterior communicating artery. PROCEDURE: The diagnostic JB 1 catheter in the left common carotid artery was exchanged over a 0.035 inch 300 cm Rosen exchange guidewire for an 85 cm 8 [place] Neuron Max guide catheter. The guidewire was removed. Good aspiration obtained from the hub of the Neuron Max sheath. Gentle control arteriogram demonstrated no evidence of spasms, dissections or of intraluminal filling defects. This was then connected to continuous heparinized saline infusion. Over a 0.035 inch Roadrunner guidewire, using biplane roadmap technique and constant fluoroscopic guidance, a 7 French 132 cm Catalyst guide catheter was then advanced without difficulty into the horizontal petrous segment of the left internal carotid artery, the guidewire was removed. Good aspiration obtained from the hub of the 7 [place] Catalyst guide catheter. A control arteriogram performed through this again demonstrated the large fast flow left internal carotid artery directed CC fistula. Using biplane roadmap technique and constant fluoroscopic guidance over a 0.014 inch Softip Synchro micro guidewire, a 4 mm x 11 mm extra compliant Scepter balloon which had been prepped with evacuation of air from the balloon, was advanced and positioned with its  distal marker into the distal cavernous segment of the left internal carotid artery. This was then left with the micro guidewire in the supraclinoid left ICA. Through a second by bore of a double Tuohy Borst, over a 0.014 inch Asahi micro guidewire, a 014 Echelon microcatheter was then advanced into the proximal cavernous segment of the left internal carotid artery. Using a torque device, the micro guidewire was then gently manipulated and access was obtained into the posterior aspect of the left cavernous sinus at the origin of the superior petrosal sinus. The guidewire was removed. Good aspiration was obtained from the hub of the Echelon microcatheter. A gentle control arteriogram performed demonstrated intra cavernous positioning of the tip of the microcatheter. Thereafter using intermittent biplane roadmap technique and constant fluoroscopic guidance, the following coils were then advanced into the cavernous sinus. A 7 mm x 20 cm Target XL coil, a 7 mm x 15 cm Target 360 soft coil, a 6 mm x 20 cm Target XL 360 soft coil, a 6 mm x 20 cm Target 360 soft coil, a 6 mm x 15 cm Target 360 soft coil, a 6 mm x 20 cm 360 standard Target coil, a Codman neuro stretch resistant 6 mm x 15 cm coil, and a 5 mm x 15 cm Target 360 soft coil. Each coil was advanced into the cavernous sinus using biplane roadmap technique and constant fluoroscopic guidance. Prior to the detachment of each coil, a control arteriogram was performed ensuring safe positioning of the coil. Also each coil was advanced into the cavernous sinus with the Scepter C balloon inflated with 50% contrast and 50% heparinized saline infusion using a 1 mL syringe. Following the final coil, placement of subsequent coils was met with the coils extending into the distal cavernous segment of the left internal carotid artery. A control arteriogram performed  through the Neuron Max sheath in the left internal carotid artery continued to demonstrate egress through the  pterygoid plexus on the left side and right side, into the right cavernous sinus via the intra cavernous tributaries, and also the superior and the inferior superior ophthalmic veins. There continued to be flow into the left internal carotid artery. Access into the supraclinoid left ICA and the left middle cerebral artery was then obtained using a Headway 17 2 tip microcatheter over a 0.014 inch standard Synchro micro guidewire. It was decided to place a 4.5 mm x 30 mm Atlas Neuroform stent in order to provide pathway for antegrade flow into the internal carotid artery intracranially. Also to provide a platform for reconstruction and possible further endovascular coiling in order to treat and eliminate or significant slow down flow through the left internal carotid artery direct CC fistula. This stent was then deployed without difficulty with stable anchoring in the supraclinoid left ICA, and also the petrous cavernous segment of the left internal carotid artery. A final control arteriogram performed through the 6 [place] Catalyst guide catheter in the horizontal petrous segment demonstrated significant reduced flow through the posterior half of the left cavernous sinus, and also the superolateral aspect of the left cavernous sinus. There continued to be significant shunting of blood into the bilateral inferior petrosal sinuses, the left pterygoid plexus and the left superior and inferior ophthalmic veins. Catalyst guide catheter, and the Neuron Max sheath were then retrieved into the abdominal aorta and removed with hemostasis obtained in the right groin puncture site with manual compression. The right groin appeared soft without evidence of a hematoma or bleeding. Distal pulses remained palpable in the dorsalis pedis, and the posterior tibial regions bilaterally at the end of the procedure. Throughout the procedure, the patient's blood pressure and neurological status remained stable. Patient was then extubated  without difficulty. Upon recovery, the patient demonstrated no new neurological signs or symptoms. She continued to have chemosis with sluggish reaction of the left pupil. 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 Otherwise, neurologically, the patient remained stable without complaints of headaches, nausea or vomiting. She was then transferred to the neuro ICU to continue on low-dose IV heparin. She was to continue with regular aspirin for the time being. 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 PLAN: Contemplate scheduling the second stage of the embolization in next 1-2 weeks depending on the patient's clinical condition. This was discussed with the patient, and the patient's sister. Electronically Signed   By: Luanne Bras M.D.   On: 10/24/2018 15:47    Labs:  CBC: Recent Labs    10/28/18 1002 10/29/18 0726 10/29/18 0851 10/30/18 0817  WBC 11.8* 15.0* 15.3* 13.6*  HGB 10.9* 12.2 12.5 10.6*  HCT 32.5* 35.5* 38.7 31.6*  PLT 312 356 357 336    COAGS: Recent Labs    10/22/18 0923 10/29/18 0726  INR 1.0 1.1    BMP: Recent Labs    10/26/18 0234 10/27/18 0449 10/28/18 1002 10/29/18 0726  NA 140 138 135 137  K 3.5 3.1* 3.7 3.3*  CL 103 103 103 97*  CO2 _0 GLUCOSE 105* 99 105* 102*  BUN 5* _1 CALCIUM  8.6* 8.9 8.6* 9.6  CREATININE 0.62 0.63 0.58 0.71  GFRNONAA >60 >60 >60 >60  GFRAA >60 >60 >60 >60    LIVER FUNCTION TESTS: Recent Labs    07/11/18 1300 10/19/18 1837 10/26/18 1129 10/29/18 0726  BILITOT 1.5* 1.4* 1.4* 1.1  AST 45* 25 35 14*  ALT 13 12 32 18  ALKPHOS 49 57 49 47  PROT 7.6 8.6* 7.6 6.6  ALBUMIN 4.3 5.2* 4.1 3.6      Assessment and Plan:  Left ICA cavernous fistula. Plan for image-guided cerebral arteriogram with possible staged embolization of left ICA cavernous fistula/aneurysm tentatively for Wednesday 10/31/2018 with Dr. Estanislado Pandy. Patient will be NPO at midnight. Afebrile, WBCs trending down to 13.6 today (from 15.3 yesterday)- will check CBC with differential in AM prior to proceeding with procedure. Ok to proceed with Plavix and Aspirin use per Dr. Estanislado Pandy. P2Y12 and INR pending for 0500 tomorrow, will check prior to procedure.  Risks and benefits ofcerebral arteriogramwith intervention were discussed with the patient including, but not limited to bleeding, infection, vascular injury, contrast induced renal failure, stroke or even death. This interventional procedure involves the use of X-rays and because of the nature of the planned procedure, it is possible that we will have prolonged use of X-ray fluoroscopy. Potential radiation risks to you include (but are not limited to) the following: - A slightly elevated risk for cancer several years later in life. This risk is typically less than 0.5% percent. This risk is low in comparison to the normal incidence of human cancer, which is 33% for women and 50% for men according to the [place]. - Radiation induced injury can include skin redness, resembling a rash, tissue breakdown / ulcers and hair loss (which can be temporary or permanent).  The likelihood of either of these occurring depends on the difficulty of the procedure and whether you are sensitive to radiation due to previous procedures, disease, or genetic conditions.  IF your procedure requires a prolonged use of radiation, you will be notified and given written instructions for further action. It is your responsibility to monitor the irradiated area for the 2 weeks following the procedure and to notify your physician if you are concerned that you have suffered a radiation  induced injury.  All of the patient's questions were answered, patient is agreeable to proceed. Consent signed and in chart.   Thank you for this interesting consult.  I greatly enjoyed meeting [name] and look forward to participating in their care.  A copy of this report was sent to the requesting provider on this date.  Electronically Signed: Earley Abide, PA-C 10/30/2018, 11:17 AM   I spent a total of 40 Minutes in face to face in clinical consultation, greater than 50% of which was counseling/coordinating care for left ICA cavernous fistula/staged embolization.

## 2018-10-30 NOTE — Progress Notes (Signed)
[facility]   Reason for visit: Follow up on leukocytosis  Interval History: WBC about the same at 13.6.  Remains afebrile, no new cough, no SOB, no diarrhea, no n/v.  No dysuria.  Restarted her menstruation early today.    vancomycin + piperacillin/tazobactam  Physical Exam: Constitutional:  Vitals:   10/30/18 1000 10/30/18 1100  BP: 132/87   Pulse: 69   Resp: 13 17  Temp:    SpO2: 100%    patient appears in NAD Eyes: anicteric HENT: no thrush Respiratory: Normal respiratory effort; CTA B Cardiovascular: RRR GI: soft, nt, nd  Review of Systems: Constitutional: negative for fevers, chills, malaise and anorexia Respiratory: negative for cough, sputum or hemoptysis Gastrointestinal: negative for nausea, vomiting and diarrhea Genitourinary: negative for frequency and dysuria Integument/breast: negative for rash Musculoskeletal: negative for myalgias and arthralgias  Lab Results  Component Value Date   WBC 13.6 (H) 10/30/2018   HGB 10.6 (L) 10/30/2018   HCT 31.6 (L) 10/30/2018   MCV 88.5 10/30/2018   PLT 336 10/30/2018    Lab Results  Component Value Date   CREATININE 0.71 10/29/2018   BUN 8 10/29/2018   NA 137 10/29/2018   K 3.3 (L) 10/29/2018   CL 97 (L) 10/29/2018   CO2 25 10/29/2018    Lab Results  Component Value Date   ALT 18 10/29/2018   AST 14 (L) 10/29/2018   ALKPHOS 47 10/29/2018     Microbiology: Recent Results (from the past 240 hour(s))  Novel Coronavirus, NAA (hospital order; send-out to ref lab)     Status: None   Collection Time: 10/22/18  2:57 PM  Result Value Ref Range Status   SARS-CoV-2, NAA NOT DETECTED NOT DETECTED Final    Comment: (NOTE) This test was developed and its performance characteristics determined by [employer]. This test has not been FDA cleared or approved. This test has been authorized by FDA under an Emergency Use Authorization (EUA). This test is only authorized for the  duration of time the declaration that circumstances exist justifying the authorization of the emergency use of in vitro diagnostic tests for detection of SARS-CoV-2 virus and/or diagnosis of COVID-19 infection under section 564(b)(1) of the Act, 21 U.S.C. 299MEQ-6(S)(3), unless the authorization is terminated or revoked sooner. When diagnostic testing is negative, the possibility of a false negative result should be considered in the context of a patient's recent exposures and the presence of clinical signs and symptoms consistent with COVID-19. An individual without symptoms of COVID-19 and who is not shedding SARS-CoV-2 virus would expect to have a negative (not detected) result in this assay. Performed  At: [facility] [address] [place], [place] [id number] Jolene Schimke MD LG:[id number]    Coronavirus Source NASOPHARYNGEAL  Final    Comment: Performed at [facility], [address], [place], [place] [address]  Surgical pcr screen     Status: None   Collection Time: 10/23/18  6:16 AM  Result Value Ref Range Status   MRSA, PCR NEGATIVE NEGATIVE Final   Staphylococcus aureus NEGATIVE NEGATIVE Final    Comment: (NOTE) The Xpert SA Assay (FDA approved for NASAL specimens in patients 71 years of age and older), is one component of a comprehensive surveillance program. It is not intended to diagnose infection nor to guide or monitor treatment. Performed at [facility], [address] [place], [place] [address]   Culture, blood (Routine X 2) w Reflex to ID  Panel     Status: None (Preliminary result)   Collection Time: 10/27/18  6:46 PM  Result Value Ref Range Status   Specimen Description BLOOD RIGHT HAND  Final   Special Requests   Final    AEROBIC BOTTLE ONLY Blood Culture results may not be optimal due to an inadequate volume of blood received in culture bottles   Culture   Final    NO GROWTH 3 DAYS Performed at [facility], [address] [place], [place] [address]    Report Status PENDING  Incomplete  Culture, blood (Routine X 2) w Reflex to ID Panel     Status: None (Preliminary result)   Collection Time: 10/27/18  6:47 PM  Result Value Ref Range Status   Specimen Description BLOOD RIGHT THUMB  Final   Special Requests   Final    AEROBIC BOTTLE ONLY Blood Culture results may not be optimal due to an inadequate volume of blood received in culture bottles   Culture   Final    NO GROWTH 3 DAYS Performed at [facility], [address] [place], [place] [address]    Report Status PENDING  Incomplete    Impression/Plan:  1. Leukocytosis - similar today.  No signs of infection.  On antibiotic empirically for 48 hours per Dr. [name].  Stop date and time placed.    2. Medication monitoring - monitor creat on combination as above, which can be nephrotoxic.

## 2018-10-30 NOTE — Progress Notes (Signed)
PROGRESS NOTE    [name]  QIW:[id number] DOB: 1970-07-16 DOA: 10/19/2018 PCP: Tresa Garter, MD   Brief Narrative:  48 year old female who presented with nausea, vomiting,andleft eye pain.She does have significant past medical history for Barrett's esophagus and hypertension. Was recently diagnosed with a 9 mm right ICA aneurysm which repair was on hold due to current COVID pandemic.Her initial physical examination blood pressure 138/96, heart rate 89, respirate 16, temperature 98, oxygen saturation 98%. She had mild proptosis of the left eye, subconjunctival hematoma and positive discomfort with eye movement. Moist mucous membranes, lungs clear to auscultation bilaterally, heart S1-S2 present rhythmic, abdomen soft, no lower extremity edema. CT angiography of the head showed abnormal fat stranding involving the intraconal fat of the left orbit with associated extraocular muscle enlargement and engorgement of the left superior orbital vein, findings concerning for possible cavernous carotid fistula or arteriovenous shunt about the cavernous sinus.Chest film with no infiltrates, left base pleural thickening, costophrenic angle.  Patient was admitted to the hospital with a working diagnosis ofleftcarotid cavernous fistula Assessment & Plan:   Principal Problem:   Carotid-cavernous fistula Active Problems:   Essential hypertension   Hypokalemia   Hypercalcemia   Aneurysm of right internal carotid artery   Acquired left carotid-cavernous fistula    Left ICA cavernous fistula s/p staged embolization using coils and stenting 10/23/2018 by Dr. Corliss Skains. Further recommendations as per neuro IR.  Plan for staged embolization of the large fast flow lt ICA [facility] fistula with coiling and stenting on Wednesday after 48 hours of empiric antibiotics. Ophthalmology consulted for persistent pain in the left eye.  Continue with aspirin and plavix.   Hypertension:  Well  controlled.  Holding the HCTZ.    Hypokalemia and hypomagnesemia:  Replaced.    Anemia of chronic disease vs anemia of blood loss  ? - anemia panel reviewed.  - iron levels adequate.  - will check for stool for occult blood.   nausea, no vomiting or abdominal pain:  Resolved. Unclear etiology. abd x ray is negative for obstruction.  Unfortunately we cannot add omeprazole due to interaction with plavix.  Prn zofran and phenergan.  Liver enzymes are negative.  Lipase is negative.   GERD: tums added. No nausea today.    Leukocytosis: Unclear etiology. Probably reactive.  . But she remains afebrile,  UA  Is negative for infection. CXR shows small effusions, but no pneumonia. She does not appear to be toxic.   abd x ray does not show any acute abnormalities.  Venous duplex of the left upper extremity negative for DVT.  Blood cultures ordered and negative so far.  IR wanted the patient on empiric antibiotics for 48 hours and wants to do the procedure on Wednesday.  ID consulted for evaluation of leukocytosis.    Anxiety:  On diazepam.    DVT prophylaxis: scd's Code Status: full code.  Family Communication: none at bedside.  Disposition Plan: pending clinical improvement and further work up by [employer]    Consultants:   IR  ID.    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 Corliss Skains.   Antimicrobials: vancomycin and zosyn since 5/11   Subjective: No new complaints today.   Objective: Vitals:   10/30/18 1300 10/30/18 1400 10/30/18 1500 10/30/18 1600  BP: (!) 140/96 (!) 145/91 132/90 (!) 141/87  Pulse:  80 86 82  Resp: 10 15 (!) 22 18  Temp:    98.6 F (  37 C)  TempSrc:    Oral  SpO2:  99% 99% 98%  Weight:      Height:        Intake/Output Summary (Last 24 hours) at 10/30/2018 1702 Last data filed at 10/30/2018 1500 Gross per 24 hour  Intake 1019.91 ml  Output -  Net 1019.91 ml   Filed  Weights   10/20/18 0642 10/26/18 0813 10/29/18 0831  Weight: 47.2 kg 47.2 kg 47.2 kg    Examination:  General exam: left eye ecchyomosis , left eye pain is better.  Respiratory system: Clear to auscultation. Respiratory effort normal. No wheezing or rhonchi.  Cardiovascular system: S1 & S2 heard, RRR. No JVD,  No pedal edema. Gastrointestinal system: Abdomen is soft NT ND bs+ Central nervous system: Alert and oriented.  Extremities: Symmetric 5 x 5 power. Skin: No rashes, lesions or ulcers Psychiatry: . Mood & affect appropriate.     Data Reviewed: I have personally reviewed following labs and imaging studies  CBC: Recent Labs  Lab 10/25/18 1011 10/26/18 0234 10/27/18 0449 10/28/18 1002 10/29/18 0726 10/29/18 0851 10/30/18 0817  WBC 18.0* 14.7* 13.6* 11.8* 15.0* 15.3* 13.6*  NEUTROABS 14.5* 11.0*  --   --  11.9* 11.8* 10.2*  HGB 10.8* 10.1* 10.7* 10.9* 12.2 12.5 10.6*  HCT 32.2* 29.8* 31.6* 32.5* 35.5* 38.7 31.6*  MCV 89.9 87.9 88.8 88.6 87.4 90.6 88.5  PLT 222 209 254 312 356 357 336   Basic Metabolic Panel: Recent Labs  Lab 10/25/18 0906  10/26/18 0234 10/27/18 0449 10/28/18 1002 10/29/18 0726 10/30/18 1434  NA  --   --  140 138 135 137 134*  K  --    < > 3.5 3.1* 3.7 3.3* 2.9*  CL  --   --  103 103 103 97* 100  CO2  --   --  GLUCOSE  --   --  105* 99 105* 102* 258*  BUN  --   --  5* CREATININE  --   --  0.62 0.63 0.58 0.71 0.84  CALCIUM  --   --  8.6* 8.9 8.6* 9.6 9.3  MG 1.8  --   --   --   --   --   --    < > = values in this interval not displayed.   GFR: Estimated Creatinine Clearance: 61 mL/min (by C-G formula based on SCr of 0.84 mg/dL). Liver Function Tests: Recent Labs  Lab 10/26/18 1129 10/29/18 0726  AST 35 14*  ALT 32 18  ALKPHOS 49 47  BILITOT 1.4* 1.1  PROT 7.6 6.6  ALBUMIN 4.1 3.6   Recent Labs  Lab 10/26/18 1129  LIPASE 22  AMYLASE 61   No results for input(s): AMMONIA in the last 168 hours.  Coagulation Profile: Recent Labs  Lab 10/29/18 0726  INR 1.1   Cardiac Enzymes: No results for input(s): CKTOTAL, CKMB, CKMBINDEX, TROPONINI in the last 168 hours. BNP (last 3 results) No results for input(s): PROBNP in the last 8760 hours. HbA1C: No results for input(s): HGBA1C in the last 72 hours. CBG: No results for input(s): GLUCAP in the last 168 hours. Lipid Profile: No results for input(s): CHOL, HDL, LDLCALC, TRIG, CHOLHDL, LDLDIRECT in the last 72 hours. Thyroid Function Tests: No results for input(s): TSH, T4TOTAL, FREET4, T3FREE, THYROIDAB in the last 72 hours. Anemia Panel: No results for input(s): VITAMINB12, FOLATE, FERRITIN, TIBC, IRON, RETICCTPCT in the last 72  hours. Sepsis Labs: Recent Labs  Lab 10/26/18 1129 10/27/18 1633  LATICACIDVEN 2.9* 1.9    Recent Results (from the past 240 hour(s))  Novel Coronavirus, NAA (hospital order; send-out to ref lab)     Status: None   Collection Time: 10/22/18  2:57 PM  Result Value Ref Range Status   SARS-CoV-2, NAA NOT DETECTED NOT DETECTED Final    Comment: (NOTE) This test was developed and its performance characteristics determined by [facility]. This test has not been FDA cleared or approved. This test has been authorized by FDA under an Emergency Use Authorization (EUA). This test is only authorized for the duration of time the declaration that circumstances exist justifying the authorization of the emergency use of in vitro diagnostic tests for detection of SARS-CoV-2 virus and/or diagnosis of COVID-19 infection under section 564(b)(1) of the Act, 21 U.S.C. 161WRU-0(A)(5360bbb-3(b)(1), unless the authorization is terminated or revoked sooner. When diagnostic testing is negative, the possibility of a false negative result should be considered in the context of a patient's recent exposures and the presence of clinical signs and symptoms consistent with COVID-19. An individual without symptoms of COVID-19 and who  is not shedding SARS-CoV-2 virus would expect to have a negative (not detected) result in this assay. Performed  At: [facility] [place] [address] [place], [place] [id number] Jolene SchimkeNagendra Sanjai MD NW:[id number]:[phone]    Coronavirus Source NASOPHARYNGEAL  Final    Comment: Performed at [facility], [address] [place], [place] [address]  Surgical pcr screen     Status: None   Collection Time: 10/23/18  6:16 AM  Result Value Ref Range Status   MRSA, PCR NEGATIVE NEGATIVE Final   Staphylococcus aureus NEGATIVE NEGATIVE Final    Comment: (NOTE) The Xpert SA Assay (FDA approved for NASAL specimens in patients 49 years of age and older), is one component of a comprehensive surveillance program. It is not intended to diagnose infection nor to guide or monitor treatment. Performed at [facility], [address] [place], [place] [address]   Culture, blood (Routine X 2) w Reflex to ID Panel     Status: None (Preliminary result)   Collection Time: 10/27/18  6:46 PM  Result Value Ref Range Status   Specimen Description BLOOD RIGHT HAND  Final   Special Requests   Final    AEROBIC BOTTLE ONLY Blood Culture results may not be optimal due to an inadequate volume of blood received in culture bottles   Culture   Final    NO GROWTH 3 DAYS Performed at [facility], [address] [place], [place] [address]    Report Status PENDING  Incomplete  Culture, blood (Routine X 2) w Reflex to ID Panel     Status: None (Preliminary result)   Collection Time: 10/27/18  6:47 PM  Result Value Ref Range Status   Specimen Description BLOOD RIGHT THUMB  Final   Special Requests   Final    AEROBIC BOTTLE ONLY Blood Culture results may not be optimal due to an inadequate volume of blood received in culture bottles   Culture   Final    NO GROWTH 3 DAYS Performed at [facility], [address] [place], [place] [address]    Report Status PENDING  Incomplete         Radiology  Studies: No results found.      Scheduled Meds: . artificial tears   Left Eye QHS  . aspirin  325 mg Oral Daily   Or  . aspirin  324 mg Per Tube Daily  . brimonidine  1 drop Left Eye BID  . calcium carbonate  1 tablet Oral BID WC  . clopidogrel  75 mg Oral Daily  . feeding supplement  1 Container Oral TID BM  . polyvinyl alcohol  1 drop Left Eye QID  . potassium chloride  40 mEq Oral BID   Continuous Infusions: . piperacillin-tazobactam (ZOSYN)  IV 3.375 g (10/30/18 1521)  . vancomycin Stopped (10/30/18 1457)     LOS: 10 days    Time spent: 28 minutes.     Kathlen Mody, MD [facility] Pager [phone]  If 7PM-7AM, please contact night-coverage [email or web address] Password [facility] 10/30/2018, 5:02 PM

## 2018-10-31 ENCOUNTER — Inpatient Hospital Stay (HOSPITAL_COMMUNITY): Payer: 59

## 2018-10-31 ENCOUNTER — Inpatient Hospital Stay (HOSPITAL_COMMUNITY): Payer: 59 | Admitting: Anesthesiology

## 2018-10-31 ENCOUNTER — Encounter (HOSPITAL_COMMUNITY)
Admission: EM | Disposition: A | Discharge status: Home / Self Care | Payer: Self-pay | Source: Home / Self Care | Attending: Internal Medicine

## 2018-10-31 HISTORY — PX: IR ANGIO INTRA EXTRACRAN SEL INTERNAL CAROTID UNI L MOD SED: IMG5361

## 2018-10-31 HISTORY — PX: IR TRANSCATH/EMBOLIZ: IMG695

## 2018-10-31 HISTORY — PX: RADIOLOGY WITH ANESTHESIA: SHX6223

## 2018-10-31 HISTORY — PX: IR ANGIOGRAM FOLLOW UP STUDY: IMG697

## 2018-10-31 HISTORY — PX: IR NEURO EACH ADD'L AFTER BASIC UNI LEFT (MS): IMG5373

## 2018-10-31 HISTORY — PX: IR ANGIO INTRA EXTRACRAN SEL COM CAROTID INNOMINATE UNI R MOD SED: IMG5359

## 2018-10-31 LAB — BASIC METABOLIC PANEL
Anion gap: 11 (ref 5–15)
BUN: 11 mg/dL (ref 6–20)
CO2: 28 mmol/L (ref 22–32)
Calcium: 9.8 mg/dL (ref 8.9–10.3)
Chloride: 100 mmol/L (ref 98–111)
Creatinine, Ser: 0.87 mg/dL (ref 0.44–1.00)
GFR calc Af Amer: >60 mL/min (ref >60)
GFR calc non Af Amer: >60 mL/min (ref >60)
Glucose, Bld: 98 mg/dL (ref 70–99)
Potassium: 3.7 mmol/L (ref 3.5–5.1)
Sodium: 139 mmol/L (ref 135–145)

## 2018-10-31 LAB — POCT ACTIVATED CLOTTING TIME: Activated Clotting Time: 235 seconds

## 2018-10-31 LAB — CBC WITH DIFFERENTIAL/PLATELET
Abs Immature Granulocytes: 0.06 10*3/uL (ref 0.00–0.07)
Basophils Absolute: 0.1 10*3/uL (ref 0.0–0.1)
Basophils Relative: 0 %
Eosinophils Absolute: 0.4 10*3/uL (ref 0.0–0.5)
Eosinophils Relative: 3 %
HCT: 35 % — ABNORMAL LOW (ref 36.0–46.0)
Hemoglobin: 11.5 g/dL — ABNORMAL LOW (ref 12.0–15.0)
Immature Granulocytes: 0 %
Lymphocytes Relative: 20 %
Lymphs Abs: 2.8 10*3/uL (ref 0.7–4.0)
MCH: 29.6 pg (ref 26.0–34.0)
MCHC: 32.9 g/dL (ref 30.0–36.0)
MCV: 90.2 fL (ref 80.0–100.0)
Monocytes Absolute: 1 10*3/uL (ref 0.1–1.0)
Monocytes Relative: 7 %
Neutro Abs: 9.9 10*3/uL — ABNORMAL HIGH (ref 1.7–7.7)
Neutrophils Relative %: 70 %
Platelets: 387 10*3/uL (ref 150–400)
RBC: 3.88 MIL/uL (ref 3.87–5.11)
RDW: 13.9 % (ref 11.5–15.5)
WBC: 14.3 10*3/uL — ABNORMAL HIGH (ref 4.0–10.5)
nRBC: 0 % (ref 0.0–0.2)

## 2018-10-31 LAB — GLUCOSE, CAPILLARY: Glucose-Capillary: 88 mg/dL (ref 70–99)

## 2018-10-31 LAB — PLATELET INHIBITION P2Y12: Platelet Function  P2Y12: 86 [PRU] — ABNORMAL LOW (ref 182–335)

## 2018-10-31 LAB — TYPE AND SCREEN
ABO/RH(D): O POS
Antibody Screen: NEGATIVE

## 2018-10-31 LAB — ABO/RH: ABO/RH(D): O POS

## 2018-10-31 LAB — PROTIME-INR
INR: 1.1 (ref 0.8–1.2)
Prothrombin Time: 13.9 seconds (ref 11.4–15.2)

## 2018-10-31 SURGERY — IR WITH ANESTHESIA
Anesthesia: General

## 2018-10-31 MED ORDER — FENTANYL CITRATE (PF) 100 MCG/2ML IJ SOLN: 25.0000 ug | INTRAMUSCULAR | Status: DC | PRN

## 2018-10-31 MED ORDER — CLEVIDIPINE BUTYRATE 0.5 MG/ML IV EMUL
1.0000 mg/h | INTRAVENOUS | Status: DC
  Filled 2018-10-31: qty 50

## 2018-10-31 MED ORDER — SODIUM CHLORIDE 0.9 % IV SOLN
INTRAVENOUS | Status: DC | PRN
  Administered 2018-10-31: 09:00:00 via INTRAVENOUS

## 2018-10-31 MED ORDER — ASPIRIN 81 MG PO CHEW: 81.0000 mg | CHEWABLE_TABLET | Freq: Every day | ORAL | Status: DC

## 2018-10-31 MED ORDER — IOHEXOL 300 MG/ML  SOLN
150.0000 mL | Freq: Once | INTRAMUSCULAR | Status: AC | PRN
  Administered 2018-10-31: 12:00:00 75 mL via INTRA_ARTERIAL

## 2018-10-31 MED ORDER — IOHEXOL 300 MG/ML  SOLN
150.0000 mL | Freq: Once | INTRAMUSCULAR | Status: AC | PRN
  Administered 2018-10-31: 75 mL via INTRA_ARTERIAL

## 2018-10-31 MED ORDER — ACETAMINOPHEN 325 MG PO TABS: 325.0000 mg | ORAL_TABLET | ORAL | Status: DC | PRN

## 2018-10-31 MED ORDER — IOHEXOL 300 MG/ML  SOLN
100.0000 mL | Freq: Once | INTRAMUSCULAR | Status: AC | PRN
  Administered 2018-10-31: 40 mL via INTRA_ARTERIAL

## 2018-10-31 MED ORDER — ROCURONIUM BROMIDE 50 MG/5ML IV SOSY
PREFILLED_SYRINGE | INTRAVENOUS | Status: DC | PRN
  Administered 2018-10-31: 50 mg via INTRAVENOUS
  Administered 2018-10-31: 10 mg via INTRAVENOUS
  Administered 2018-10-31: 20 mg via INTRAVENOUS
  Administered 2018-10-31: 10 mg via INTRAVENOUS
  Administered 2018-10-31: 20 mg via INTRAVENOUS

## 2018-10-31 MED ORDER — CLOPIDOGREL BISULFATE 75 MG PO TABS: 75.0000 mg | ORAL_TABLET | ORAL | Status: DC

## 2018-10-31 MED ORDER — DEXAMETHASONE SODIUM PHOSPHATE 10 MG/ML IJ SOLN
INTRAMUSCULAR | Status: DC | PRN
  Administered 2018-10-31: 4 mg via INTRAVENOUS

## 2018-10-31 MED ORDER — ASPIRIN EC 325 MG PO TBEC: 325.0000 mg | DELAYED_RELEASE_TABLET | ORAL | Status: DC

## 2018-10-31 MED ORDER — ONDANSETRON HCL 4 MG/2ML IJ SOLN: 4.0000 mg | Freq: Once | INTRAMUSCULAR | Status: DC | PRN

## 2018-10-31 MED ORDER — SODIUM CHLORIDE 0.9 % IV SOLN: INTRAVENOUS | Status: DC

## 2018-10-31 MED ORDER — SODIUM CHLORIDE 0.9 % IV SOLN
INTRAVENOUS | Status: DC
  Administered 2018-10-31 – 2018-11-01 (×2): via INTRAVENOUS

## 2018-10-31 MED ORDER — HEPARIN SODIUM (PORCINE) 1000 UNIT/ML IJ SOLN
INTRAMUSCULAR | Status: DC | PRN
  Administered 2018-10-31 (×4): 1000 [IU] via INTRAVENOUS

## 2018-10-31 MED ORDER — CEFAZOLIN SODIUM-DEXTROSE 2-4 GM/100ML-% IV SOLN: 2.0000 g | INTRAVENOUS | Status: DC

## 2018-10-31 MED ORDER — ACETAMINOPHEN 160 MG/5ML PO SOLN: 325.0000 mg | ORAL | Status: DC | PRN

## 2018-10-31 MED ORDER — GLYCOPYRROLATE 0.2 MG/ML IJ SOLN
INTRAMUSCULAR | Status: DC | PRN
  Administered 2018-10-31: 0.1 mg via INTRAVENOUS

## 2018-10-31 MED ORDER — LIDOCAINE 2% (20 MG/ML) 5 ML SYRINGE
INTRAMUSCULAR | Status: DC | PRN
  Administered 2018-10-31: 50 mg via INTRAVENOUS

## 2018-10-31 MED ORDER — MEPERIDINE HCL 25 MG/ML IJ SOLN: 6.2500 mg | INTRAMUSCULAR | Status: DC | PRN

## 2018-10-31 MED ORDER — CLEVIDIPINE BUTYRATE 0.5 MG/ML IV EMUL: 0.0000 mg/h | INTRAVENOUS | Status: AC

## 2018-10-31 MED ORDER — OXYCODONE HCL 5 MG/5ML PO SOLN: 5.0000 mg | Freq: Once | ORAL | Status: DC | PRN

## 2018-10-31 MED ORDER — NIMODIPINE 30 MG PO CAPS: 0.0000 mg | ORAL_CAPSULE | ORAL | Status: DC

## 2018-10-31 MED ORDER — NITROGLYCERIN 1 MG/10 ML FOR IR/CATH LAB
INTRA_ARTERIAL | Status: AC
  Filled 2018-10-31: qty 10

## 2018-10-31 MED ORDER — PHENYLEPHRINE 40 MCG/ML (10ML) SYRINGE FOR IV PUSH (FOR BLOOD PRESSURE SUPPORT)
PREFILLED_SYRINGE | INTRAVENOUS | Status: DC | PRN
  Administered 2018-10-31: 40 ug via INTRAVENOUS

## 2018-10-31 MED ORDER — PROPOFOL 10 MG/ML IV BOLUS
INTRAVENOUS | Status: DC | PRN
  Administered 2018-10-31: 100 mg via INTRAVENOUS

## 2018-10-31 MED ORDER — HEPARIN (PORCINE) 25000 UT/250ML-% IV SOLN
450.0000 [IU]/h | INTRAVENOUS | Status: DC
  Filled 2018-10-31: qty 250

## 2018-10-31 MED ORDER — ONDANSETRON HCL 4 MG/2ML IJ SOLN
INTRAMUSCULAR | Status: DC | PRN
  Administered 2018-10-31: 4 mg via INTRAVENOUS

## 2018-10-31 MED ORDER — ASPIRIN 81 MG PO CHEW
81.0000 mg | CHEWABLE_TABLET | Freq: Every day | ORAL | Status: DC
  Administered 2018-11-01 – 2018-11-02 (×2): 81 mg via ORAL
  Filled 2018-10-31 (×2): qty 1

## 2018-10-31 MED ORDER — MIDAZOLAM HCL 2 MG/2ML IJ SOLN
INTRAMUSCULAR | Status: DC | PRN
  Administered 2018-10-31: 2 mg via INTRAVENOUS

## 2018-10-31 MED ORDER — SUGAMMADEX SODIUM 200 MG/2ML IV SOLN
INTRAVENOUS | Status: DC | PRN
  Administered 2018-10-31: 100 mg via INTRAVENOUS

## 2018-10-31 MED ORDER — FENTANYL CITRATE (PF) 100 MCG/2ML IJ SOLN
INTRAMUSCULAR | Status: DC | PRN
  Administered 2018-10-31 (×2): 50 ug via INTRAVENOUS

## 2018-10-31 MED ORDER — HEPARIN (PORCINE) 25000 UT/250ML-% IV SOLN
INTRAVENOUS | Status: AC
  Administered 2018-10-31: 500 [IU]/h via INTRAVENOUS
  Filled 2018-10-31: qty 250

## 2018-10-31 MED ORDER — PROTAMINE SULFATE 10 MG/ML IV SOLN
INTRAVENOUS | Status: DC | PRN
  Administered 2018-10-31: 7.5 mg via INTRAVENOUS

## 2018-10-31 MED ORDER — SODIUM CHLORIDE 0.9 % IV SOLN
INTRAVENOUS | Status: DC | PRN
  Administered 2018-10-31: 10 ug/min via INTRAVENOUS

## 2018-10-31 MED ORDER — HEPARIN (PORCINE) 25000 UT/250ML-% IV SOLN
500.0000 [IU]/h | INTRAVENOUS | Status: DC
  Administered 2018-10-31: 14:00:00 500 [IU]/h via INTRAVENOUS
  Filled 2018-10-31: qty 250

## 2018-10-31 MED ORDER — OXYCODONE HCL 5 MG PO TABS: 5.0000 mg | ORAL_TABLET | Freq: Once | ORAL | Status: DC | PRN

## 2018-10-31 NOTE — Progress Notes (Signed)
Patient ID: [name], female   DOB: 19-Dec-1970, 48 y.o.   MRN: [id number] INR. Post procedure Extubated without difficulty. Maintaining O2 sats. Denies any H/A.  Pupils RT 75mm  Kt 5 mm  Lt ptosis. Moves all 4s to command. Lt groin soft Distal pulses DPs and PTs dopplerable bilaterally. S.Saretta Dahlem MD

## 2018-10-31 NOTE — Progress Notes (Signed)
[facility]   Reason for visit: Follow up on leukocytosis  Interval History: WBC about the same at 14.3.  Remains afebrile.   vancomycin + piperacillin/tazobactam  Physical Exam: Constitutional:  Vitals:   10/31/18 0600 10/31/18 0700  BP: 123/87 117/81  Pulse: 65 72  Resp: 14 19  Temp:  98.2 F (36.8 C)  SpO2: 100% 97%   patient not available  Review of Systems:   Lab Results  Component Value Date   WBC 14.3 (H) 10/31/2018   HGB 11.5 (L) 10/31/2018   HCT 35.0 (L) 10/31/2018   MCV 90.2 10/31/2018   PLT 387 10/31/2018    Lab Results  Component Value Date   CREATININE 0.87 10/31/2018   BUN 11 10/31/2018   NA 139 10/31/2018   K 3.7 10/31/2018   CL 100 10/31/2018   CO2 28 10/31/2018    Lab Results  Component Value Date   ALT 18 10/29/2018   AST 14 (L) 10/29/2018   ALKPHOS 47 10/29/2018     Microbiology: Recent Results (from the past 240 hour(s))  Novel Coronavirus, NAA (hospital order; send-out to ref lab)     Status: None   Collection Time: 10/22/18  2:57 PM  Result Value Ref Range Status   SARS-CoV-2, NAA NOT DETECTED NOT DETECTED Final    Comment: (NOTE) This test was developed and its performance characteristics determined by [facility]. This test has not been FDA cleared or approved. This test has been authorized by FDA under an Emergency Use Authorization (EUA). This test is only authorized for the duration of time the declaration that circumstances exist justifying the authorization of the emergency use of in vitro diagnostic tests for detection of SARS-CoV-2 virus and/or diagnosis of COVID-19 infection under section 564(b)(1) of the Act, 21 U.S.C. 161WRU-0(A)(5360bbb-3(b)(1), unless the authorization is terminated or revoked sooner. When diagnostic testing is negative, the possibility of a false negative result should be considered in the context of a patient's recent exposures and the presence of clinical signs and  symptoms consistent with COVID-19. An individual without symptoms of COVID-19 and who is not shedding SARS-CoV-2 virus would expect to have a negative (not detected) result in this assay. Performed  At: [facility] [place] [address] [place], [place] [id number] Jolene SchimkeNagendra Sanjai MD NW:[id number]:[phone]    Coronavirus Source NASOPHARYNGEAL  Final    Comment: Performed at [facility], [address] [place], [place] [address]  Surgical pcr screen     Status: None   Collection Time: 10/23/18  6:16 AM  Result Value Ref Range Status   MRSA, PCR NEGATIVE NEGATIVE Final   Staphylococcus aureus NEGATIVE NEGATIVE Final    Comment: (NOTE) The Xpert SA Assay (FDA approved for NASAL specimens in patients 49 years of age and older), is one component of a comprehensive surveillance program. It is not intended to diagnose infection nor to guide or monitor treatment. Performed at [facility], [address] [place], [place] [address]   Culture, blood (Routine X 2) w Reflex to ID Panel     Status: None (Preliminary result)   Collection Time: 10/27/18  6:46 PM  Result Value Ref Range Status   Specimen Description BLOOD RIGHT HAND  Final   Special Requests   Final    AEROBIC BOTTLE ONLY Blood Culture results may not be optimal due to an inadequate volume of blood received in culture bottles   Culture   Final    NO  GROWTH 3 DAYS Performed at [facility], [address], [place], [place] [address]    Report Status PENDING  Incomplete  Culture, blood (Routine X 2) w Reflex to ID Panel     Status: None (Preliminary result)   Collection Time: 10/27/18  6:47 PM  Result Value Ref Range Status   Specimen Description BLOOD RIGHT THUMB  Final   Special Requests   Final    AEROBIC BOTTLE ONLY Blood Culture results may not be optimal due to an inadequate volume of blood received in culture bottles   Culture   Final    NO GROWTH 3 DAYS Performed at [facility], [address] [place], [place] [address]    Report Status PENDING  Incomplete    Impression/Plan:  1. Leukocytosis - similar today and at her baseline.  No further antibiotics indicated.    Will sign off, call with questions.

## 2018-10-31 NOTE — Progress Notes (Signed)
ANTICOAGULATION CONSULT NOTE - Initial Consult  Pharmacy Consult for Heparin Indication: S/P coiling of large L carotid artery-cavernous sinus fistula  Allergies  Allergen Reactions  . Guaifenesin Shortness Of Breath  . Iron Anaphylaxis and Other (See Comments)    IV only  . Doxepin Hives  . Pseudoeph-Doxylamine-Dm-Apap Hives  . Pseudoephedrine Hives  . Sulfa Antibiotics Hives  . Hydrocodone-Acetaminophen     UNSPECIFIED REACTION   . Metoprolol Succinate Swelling    UNSPECIFIED EDEMA  . Celexa [Citalopram Hydrobromide] Other (See Comments)    HEADACHE   . Codeine Other (See Comments)    tiredness  . Hydromorphone Hcl Nausea And Vomiting  . Morphine Other (See Comments)    Does not work; hallucinations and irritable  . Pantoprazole Sodium Other (See Comments)    heartburn  . Prednisone Rash  . Tetracyclines & Related Rash    Patient Measurements: Height: 5' 7.5" (171.5 cm) Weight: 104 lb 0.9 oz (47.2 kg) IBW/kg (Calculated) : 62.75 Heparin Dosing Weight: 47.2 kg  Vital Signs: Temp: 97.3 F (36.3 C) (05/13 1350) Temp Source: Oral (05/13 0700) BP: 130/96 (05/13 1350) Pulse Rate: 66 (05/13 1350)  Labs: Recent Labs    10/29/18 0726 10/29/18 0851 10/30/18 0817 10/30/18 1434 10/31/18 0530  HGB 12.2 12.5 10.6*  --  11.5*  HCT 35.5* 38.7 31.6*  --  35.0*  PLT 356 357 336  --  387  LABPROT 13.9  --   --   --  13.9  INR 1.1  --   --   --  1.1  CREATININE 0.71  --   --  0.84 0.87    Estimated Creatinine Clearance: 58.9 mL/min (by C-G formula based on SCr of 0.87 mg/dL).   Medical History: Past Medical History:  Diagnosis Date  . Allergic rhinitis   . Anxiety   . B12 deficiency   . Barrett's esophagus   . Chronic constipation   . 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 Depression   . GERD (gastroesophageal reflux disease)   . Hiatal hernia   . History of DVT of lower extremity    08-14-2003  RIGHT LOWER EXTREMITY--  TREATED W/ COUMADIN----  per pt none since  . History of palpitations   . Hypertension   . Idiopathic chronic gout    12-20-2017  per pt stable ,   last episode 02/ 2019  . Iron deficiency anemia   . Migraines   . Personal history of PE (pulmonary embolism)    03/ 2005 RIGHT SIDE  --- treated w/ coumadin---  per pt none since  . PONV (postoperative nausea and vomiting)   . Tinea versicolor   . Vitamin D deficiency     Assessment: 48-yr old female S/P coiling of carotid artery-cavernous sinus fistula today. Pharmacy consulted to dose IV heparin. Slight reduction in H/H.  Goal of Therapy:  Heparin level:  0.1-0.25 Monitor platelets by anticoagulation protocol: Yes   Plan:  Per MD order, heparin initiated at 500 units/hr in PACU. Per heparin protocol, decreased heparin infusion rate to 450 units/hr. 6-hr heparin level ordered. Plan is to discontinue heparin infusion at 0800 on 11/01/18, per provider order.  Vicki Mallet, PharmD, BCPS, [facility] Clinical Pharmacist 10/31/2018,2:07 PM

## 2018-10-31 NOTE — Transfer of Care (Signed)
Immediate Anesthesia Transfer of Care Note  Patient: [name]  Procedure(s) Performed: EMBOLIZATION (N/A )  Patient Location: PACU  Anesthesia Type:General  Level of Consciousness: awake, alert  and oriented  Airway & Oxygen Therapy: Patient Spontanous Breathing and Patient connected to nasal cannula oxygen  Post-op Assessment: Report given to RN, Post -op Vital signs reviewed and stable and Patient moving all extremities  Post vital signs: Reviewed and stable  Last Vitals:  Vitals Value Taken Time  BP 130/96 10/31/2018  1:50 PM  Temp 36.3 C 10/31/2018  1:50 PM  Pulse 63 10/31/2018  2:00 PM  Resp 10 10/31/2018  2:00 PM  SpO2 100 % 10/31/2018  2:00 PM  Vitals shown include unvalidated device data.  Last Pain:  Vitals:   10/31/18 1350  TempSrc:   PainSc: 0-No pain         Complications: No apparent anesthesia complications

## 2018-10-31 NOTE — Progress Notes (Signed)
Patient assessed by PA alongside Dr. Corliss Skains after embolization of large L CC fistula.   Patient awakens to voice, but is sleepy after procedure.  She is alert when awake and is able to answer questions accurately.  She has left eye swelling with significant bruising.   Left eye is sluggish, pupil 82mm, EOMs diminished especially with rescept to left gaze Right eye is intact. Pupil 19mm and reactive to light. Moves all extremities on command.  Strength 5/5. Groin intact; no evidence of hematoma or pseudoaneurysm.   Discussed care plan with RN.  Heparin to be stopped.  Patient to continue aspirin only.  No Plavix.   Patient to transition to care of hospitalist team for medical management.  IR will continue to follow post-procedure.   Carla Dys, MS RD PA-C 4:45 PM

## 2018-10-31 NOTE — Progress Notes (Signed)
PROGRESS NOTE    [name]  TYO:[id number] DOB: 24-May-1971 DOA: 10/19/2018 PCP: Tresa Garter, MD   Brief Narrative:  48 year old female who presented with nausea, vomiting,andleft eye pain.She does have significant past medical history for Barrett's esophagus and hypertension. Was recently diagnosed with a 9 mm right ICA aneurysm which repair was on hold due to current COVID pandemic.Her initial physical examination blood pressure 138/96, heart rate 89, respirate 16, temperature 98, oxygen saturation 98%. She had mild proptosis of the left eye, subconjunctival hematoma and positive discomfort with eye movement. Moist mucous membranes, lungs clear to auscultation bilaterally, heart S1-S2 present rhythmic, abdomen soft, no lower extremity edema. CT angiography of the head showed abnormal fat stranding involving the intraconal fat of the left orbit with associated extraocular muscle enlargement and engorgement of the left superior orbital vein, findings concerning for possible cavernous carotid fistula or arteriovenous shunt about the cavernous sinus.Chest film with no infiltrates, left base pleural thickening, costophrenic angle.  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 Corliss Skains. -per [facility] Dr.Deveshwar -s/p staged coiling of [place] carotid fistula today, pt seen in PACU, drowsy -DC abx  -continue ASA 81mg  only  Hypertension:  Well controlled.  -Hydralazine PRN  Hypokalemia and hypomagnesemia:  Replaced.   Anemia of chronic disease vs anemia of blood loss and hemodiluation - anemia panel reviewed.  - iron levels adequate.  -monitor  Nausea Unclear etiology. -resolved with supportive care  GERD: -tums PRN.   Leukocytosis: -likely reactive, afebrile and nontoxic -also followed by ID  -workup unremarkable, CXR/UA/Blood Cx, Dopplers all negative  Anxiety:  On diazepam.  DVT prophylaxis: scd's Code Status: full code.  Family Communication: none at bedside.  Disposition Plan: home when cleared by IR    Consultants:   IR  ID.    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 Corliss Skains.   Antimicrobials: vancomycin and zosyn since 5/11   Subjective: -Pt seen in PACU after embolization, just extubated  Objective: Vitals:   10/31/18 1700 10/31/18 1800 10/31/18 1900 10/31/18 1930  BP: 111/77 115/77 112/79   Pulse: (!) 58 66 (!) 54   Resp: 19 19 17    Temp:    100 F (37.8 C)  TempSrc:    Oral  SpO2: 98% 98% 99%   Weight:      Height:        Intake/Output Summary (Last 24 hours) at 10/31/2018 2041 Last data filed at 10/31/2018 1900 Gross per 24 hour  Intake 1657.51 ml  Output 820 ml  Net 837.51 ml   Filed Weights   10/26/18 0813 10/29/18 0831 10/31/18 0801  Weight: 47.2 kg 47.2 kg 47.2 kg    Examination: Gen: somnolent, seen in PACU, still drowsy from effects of anesthesia, just extubated,  HEENT: PERRLA, L eye ecchymosis Lungs: poor air movement B/L CVS: RRR,No Gallops,Rubs or new Murmurs Abd: soft, Non tender, non distended, BS present Extremities: No edema Skin: no new rashes   Data Reviewed: I have personally reviewed following labs and imaging studies  CBC: Recent Labs  Lab 10/26/18 0234  10/28/18 1002 10/29/18 0726 10/29/18 0851 10/30/18 0817 10/31/18 0530  WBC 14.7*   < > 11.8* 15.0* 15.3* 13.6* 14.3*  NEUTROABS 11.0*  --   --  11.9* 11.8* 10.2* 9.9*  HGB 10.1*   < >  10.9* 12.2 12.5 10.6* 11.5*  HCT 29.8*   < > 32.5* 35.5* 38.7 31.6* 35.0*  MCV 87.9   < > 88.6 87.4 90.6 88.5 90.2  PLT 209   < > 312 356 357 336 387   < > = values in this interval not displayed.   Basic Metabolic Panel: Recent Labs  Lab 10/25/18 0906  10/27/18 0449 10/28/18 1002 10/29/18  0726 10/30/18 1434 10/31/18 0530  NA  --    < > 138 135 137 134* 139  K  --    < > 3.1* 3.7 3.3* 2.9* 3.7  CL  --    < > 103 103 97* 100 100  CO2  --    < > 23 22 25 24 28   GLUCOSE  --    < > 99 105* 102* 258* 98  BUN  --    < > 7 7 8 13 11   CREATININE  --    < > 0.63 0.58 0.71 0.84 0.87  CALCIUM  --    < > 8.9 8.6* 9.6 9.3 9.8  MG 1.8  --   --   --   --   --   --    < > = values in this interval not displayed.   GFR: Estimated Creatinine Clearance: 58.9 mL/min (by C-G formula based on SCr of 0.87 mg/dL). Liver Function Tests: Recent Labs  Lab 10/26/18 1129 10/29/18 0726  AST 35 14*  ALT 32 18  ALKPHOS 49 47  BILITOT 1.4* 1.1  PROT 7.6 6.6  ALBUMIN 4.1 3.6   Recent Labs  Lab 10/26/18 1129  LIPASE 22  AMYLASE 61   No results for input(s): AMMONIA in the last 168 hours. Coagulation Profile: Recent Labs  Lab 10/29/18 0726 10/31/18 0530  INR 1.1 1.1   Cardiac Enzymes: No results for input(s): CKTOTAL, CKMB, CKMBINDEX, TROPONINI in the last 168 hours. BNP (last 3 results) No results for input(s): PROBNP in the last 8760 hours. HbA1C: No results for input(s): HGBA1C in the last 72 hours. CBG: Recent Labs  Lab 10/31/18 1600  GLUCAP 88   Lipid Profile: No results for input(s): CHOL, HDL, LDLCALC, TRIG, CHOLHDL, LDLDIRECT in the last 72 hours. Thyroid Function Tests: No results for input(s): TSH, T4TOTAL, FREET4, T3FREE, THYROIDAB in the last 72 hours. Anemia Panel: No results for input(s): VITAMINB12, FOLATE, FERRITIN, TIBC, IRON, RETICCTPCT in the last 72 hours. Sepsis Labs: Recent Labs  Lab 10/26/18 1129 10/27/18 1633  LATICACIDVEN 2.9* 1.9    Recent Results (from the past 240 hour(s))  Novel Coronavirus, NAA (hospital order; send-out to ref lab)     Status: None   Collection Time: 10/22/18  2:57 PM  Result Value Ref Range Status   SARS-CoV-2, NAA NOT DETECTED NOT DETECTED Final    Comment: (NOTE) This test was developed and its performance  characteristics determined by [facility]. This test has not been FDA cleared or approved. This test has been authorized by FDA under an Emergency Use Authorization (EUA). This test is only authorized for the duration of time the declaration that circumstances exist justifying the authorization of the emergency use of in vitro diagnostic tests for detection of SARS-CoV-2 virus and/or diagnosis of COVID-19 infection under section 564(b)(1) of the Act, 21 U.S.C. 161WRU-0(A)(5360bbb-3(b)(1), unless the authorization is terminated or revoked sooner. When diagnostic testing is negative, the possibility of a false negative result should be considered in the context of a patient's recent exposures and the presence of  clinical signs and symptoms consistent with COVID-19. An individual without symptoms of COVID-19 and who is not shedding SARS-CoV-2 virus would expect to have a negative (not detected) result in this assay. Performed  At: [facility] [address] [place], [place] [id number] Jolene Schimke MD LN:[id number]    Coronavirus Source NASOPHARYNGEAL  Final    Comment: Performed at [facility], [address] [place], [place] [address]  Surgical pcr screen     Status: None   Collection Time: 10/23/18  6:16 AM  Result Value Ref Range Status   MRSA, PCR NEGATIVE NEGATIVE Final   Staphylococcus aureus NEGATIVE NEGATIVE Final    Comment: (NOTE) The Xpert SA Assay (FDA approved for NASAL specimens in patients 26 years of age and older), is one component of a comprehensive surveillance program. It is not intended to diagnose infection nor to guide or monitor treatment. Performed at [facility], [address] [place], [place] [address]   Culture, blood (Routine X 2) w Reflex to ID Panel     Status: None (Preliminary result)   Collection Time: 10/27/18  6:46 PM  Result Value Ref Range Status   Specimen Description BLOOD RIGHT HAND  Final   Special Requests   Final     AEROBIC BOTTLE ONLY Blood Culture results may not be optimal due to an inadequate volume of blood received in culture bottles   Culture   Final    NO GROWTH 4 DAYS Performed at [facility], [address] [place], [place] [address]    Report Status PENDING  Incomplete  Culture, blood (Routine X 2) w Reflex to ID Panel     Status: None (Preliminary result)   Collection Time: 10/27/18  6:47 PM  Result Value Ref Range Status   Specimen Description BLOOD RIGHT THUMB  Final   Special Requests   Final    AEROBIC BOTTLE ONLY Blood Culture results may not be optimal due to an inadequate volume of blood received in culture bottles   Culture   Final    NO GROWTH 4 DAYS Performed at [facility], [address], [place], [place] [address]    Report Status PENDING  Incomplete         Radiology Studies: No results found.      Scheduled Meds: . artificial tears   Left Eye QHS  . aspirin  324 mg Per Tube Daily  . [START ON 11/01/2018] aspirin  81 mg Oral Daily   Or  . [START ON 11/01/2018] aspirin  81 mg Per Tube Daily  . brimonidine  1 drop Left Eye BID  . calcium carbonate  1 tablet Oral BID WC  . feeding supplement  1 Container Oral TID BM  . nitroGLYCERIN      . polyvinyl alcohol  1 drop Left Eye QID  . potassium chloride  40 mEq Oral BID   Continuous Infusions: . sodium chloride 75 mL/hr at 10/31/18 1341  . sodium chloride    . clevidipine    . vancomycin Stopped (10/30/18 1457)     LOS: 11 days    Time spent: 25 minutes.     Zannie Cove, MD [facility]  10/31/2018, 8:41 PM

## 2018-10-31 NOTE — Sedation Documentation (Signed)
Patient is under the care of anesthesia

## 2018-10-31 NOTE — Procedures (Signed)
S/O bilateral common carotid arteriogram followed by coiling of large Lt CCFistula .

## 2018-10-31 NOTE — Anesthesia Procedure Notes (Signed)
Procedure Name: Intubation Date/Time: 10/31/2018 9:10 AM Performed by: Bethena Midget, MD Pre-anesthesia Checklist: Patient identified, Emergency Drugs available, Suction available and Patient being monitored Patient Re-evaluated:Patient Re-evaluated prior to induction Oxygen Delivery Method: Circle System Utilized Preoxygenation: Pre-oxygenation with 100% oxygen Induction Type: IV induction Ventilation: Mask ventilation without difficulty Laryngoscope Size: Glidescope and 3 Grade View: Grade I Tube type: Oral Tube size: 7.5 mm Number of attempts: 1 Airway Equipment and Method: Stylet and Oral airway Placement Confirmation: ETT inserted through vocal cords under direct vision,  positive ETCO2 and breath sounds checked- equal and bilateral Secured at: 21 cm Tube secured with: Tape Dental Injury: Teeth and Oropharynx as per pre-operative assessment  Comments: glidescope used for decreased TMD, overbite. Intubated without issue.

## 2018-10-31 NOTE — Anesthesia Postprocedure Evaluation (Signed)
Anesthesia Post Note  Patient: [name]  Procedure(s) Performed: EMBOLIZATION (N/A )     Patient location during evaluation: PACU Anesthesia Type: General Level of consciousness: awake and alert Pain management: pain level controlled Vital Signs Assessment: post-procedure vital signs reviewed and stable Respiratory status: spontaneous breathing, nonlabored ventilation, respiratory function stable and patient connected to nasal cannula oxygen Cardiovascular status: blood pressure returned to baseline and stable Postop Assessment: no apparent nausea or vomiting Anesthetic complications: no    Last Vitals:  Vitals:   10/31/18 1700 10/31/18 1800  BP: 111/77 115/77  Pulse: (!) 58 66  Resp: 19 19  Temp:    SpO2: 98% 98%    Last Pain:  Vitals:   10/31/18 1800  TempSrc:   PainSc: 0-No pain                 [name]

## 2018-11-01 ENCOUNTER — Encounter (HOSPITAL_COMMUNITY): Payer: Self-pay | Admitting: Interventional Radiology

## 2018-11-01 LAB — CULTURE, BLOOD (ROUTINE X 2)
Culture: NO GROWTH
Culture: NO GROWTH

## 2018-11-01 LAB — CBC
HCT: 29.7 % — ABNORMAL LOW (ref 36.0–46.0)
Hemoglobin: 9.7 g/dL — ABNORMAL LOW (ref 12.0–15.0)
MCH: 29.9 pg (ref 26.0–34.0)
MCHC: 32.7 g/dL (ref 30.0–36.0)
MCV: 91.7 fL (ref 80.0–100.0)
Platelets: 287 10*3/uL (ref 150–400)
RBC: 3.24 MIL/uL — ABNORMAL LOW (ref 3.87–5.11)
RDW: 14.6 % (ref 11.5–15.5)
WBC: 12.9 10*3/uL — ABNORMAL HIGH (ref 4.0–10.5)
nRBC: 0 % (ref 0.0–0.2)

## 2018-11-01 LAB — BASIC METABOLIC PANEL
Anion gap: 7 (ref 5–15)
BUN: 11 mg/dL (ref 6–20)
CO2: 25 mmol/L (ref 22–32)
Calcium: 8.4 mg/dL — ABNORMAL LOW (ref 8.9–10.3)
Chloride: 107 mmol/L (ref 98–111)
Creatinine, Ser: 0.9 mg/dL (ref 0.44–1.00)
GFR calc Af Amer: >60 mL/min (ref >60)
GFR calc non Af Amer: >60 mL/min (ref >60)
Glucose, Bld: 86 mg/dL (ref 70–99)
Potassium: 3.3 mmol/L — ABNORMAL LOW (ref 3.5–5.1)
Sodium: 139 mmol/L (ref 135–145)

## 2018-11-01 LAB — CBC WITH DIFFERENTIAL/PLATELET
Abs Immature Granulocytes: 0.08 10*3/uL — ABNORMAL HIGH (ref 0.00–0.07)
Basophils Absolute: 0.1 10*3/uL (ref 0.0–0.1)
Basophils Relative: 0 %
Eosinophils Absolute: 0.2 10*3/uL (ref 0.0–0.5)
Eosinophils Relative: 1 %
HCT: 26.3 % — ABNORMAL LOW (ref 36.0–46.0)
Hemoglobin: 8.5 g/dL — ABNORMAL LOW (ref 12.0–15.0)
Immature Granulocytes: 1 %
Lymphocytes Relative: 18 %
Lymphs Abs: 2.7 10*3/uL (ref 0.7–4.0)
MCH: 30.1 pg (ref 26.0–34.0)
MCHC: 32.3 g/dL (ref 30.0–36.0)
MCV: 93.3 fL (ref 80.0–100.0)
Monocytes Absolute: 1.1 10*3/uL — ABNORMAL HIGH (ref 0.1–1.0)
Monocytes Relative: 8 %
Neutro Abs: 10.5 10*3/uL — ABNORMAL HIGH (ref 1.7–7.7)
Neutrophils Relative %: 72 %
Platelets: 281 10*3/uL (ref 150–400)
RBC: 2.82 MIL/uL — ABNORMAL LOW (ref 3.87–5.11)
RDW: 14.5 % (ref 11.5–15.5)
WBC: 14.7 10*3/uL — ABNORMAL HIGH (ref 4.0–10.5)
nRBC: 0 % (ref 0.0–0.2)

## 2018-11-01 MED ORDER — TIMOLOL MALEATE 0.5 % OP SOLN
1.0000 [drp] | Freq: Two times a day (BID) | OPHTHALMIC | Status: DC
  Administered 2018-11-01 – 2018-11-02 (×2): 1 [drp] via OPHTHALMIC
  Filled 2018-11-01: qty 5

## 2018-11-01 MED ORDER — LATANOPROST 0.005 % OP SOLN
1.0000 [drp] | Freq: Every day | OPHTHALMIC | Status: DC
  Administered 2018-11-01: 1 [drp] via OPHTHALMIC
  Filled 2018-11-01: qty 2.5

## 2018-11-01 MED ORDER — BRIMONIDINE TARTRATE 0.2 % OP SOLN
1.0000 [drp] | Freq: Three times a day (TID) | OPHTHALMIC | Status: DC
  Administered 2018-11-01 – 2018-11-02 (×3): 1 [drp] via OPHTHALMIC

## 2018-11-01 MED ORDER — POTASSIUM CHLORIDE CRYS ER 20 MEQ PO TBCR
40.0000 meq | EXTENDED_RELEASE_TABLET | Freq: Once | ORAL | Status: AC
  Administered 2018-11-01: 40 meq via ORAL
  Filled 2018-11-01: qty 2

## 2018-11-01 NOTE — Progress Notes (Signed)
  Nikyla Priem Mccutchen is a 48 y.o. 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 She presented to [facility] ED on 10/19/2018 with left eye pain and tinnitus. CTA revealed new finding of left ICA cavernous fistula. NIR was consulted and she had a cerebral arteriogram 11/07/18 which confirmed large left ICA-cavernous fistula.   She is s/p embolization of large CC fistula 10/31/2018. Doing well. Less discomfort in the left eye.   Exam:   Zenaida Niece: OD: 20/30 near equivalent OS: 20/80 near equivalent  CVF: OD: full to count fingers OS: full to count fingers  EOM: OD: full d/v OS: limited abduction, supraduction, infraduction, adduction  Pupils: OD: 3->2 mm, no APD OS: 6-5.71mm, minimally reactive no APD by reverse  IOP: by [place] OD: 15 OS:  36  External: OD: no periorbital edema, no proptosis, V1-V3 intact and symmetric, good orbicularis strength OS: 2+ periorbital edema, 2+ ecchymosis, 1+ erythema  PF: 69mm/2mm - slightly worsening ptosis OS  Pen Light Exam: L/L: OD: WNL OS: 2+ edema upper, 1+ lower, ecchymosis  C/S: OD: white and quiet OS:  superior subconj heme, resolving, less chemosis and congestion  K: OD: clear, no abnormal staining OS: 1-2+ PEE/staining  A/C: OD: grossly deep and quiet appearing by pen light OS: grossly deep and quiet appearing by pen light  I: OD: round and regular OS: round and regular  L: OD: NSC OS: NSC  V:  OS: clear  N:  OS: C/D 0.55, no disc edema  M: OS: flat, no obvious macular pathology  V: OS: normal appearing vessels  P: OS: retina flat 360, no obvious mass/RT/RD  A/P:  1. 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 - Discussed could consider night-time patching if not much improvement, or 24 hr patching OS.   2. Intraocular Hypertension OS - Likely due to CC fistula, slightly worse OS today on Alphagan BID. Will increase Alphagan to TID OS and add Timolol BID OS, and Latanoprost qhs OS - Continue on all drops as outpatient after discharge - Will normalize likely hopefully in a few days  - F/up with me as oupatient on Monday or Tuesday at my office. Reviewed with patient.   Office number: [phone], extension [phone]  Time spent with patient was 25 minutes.   Wynell Balloon, MD,[facility] [phone]

## 2018-11-01 NOTE — Progress Notes (Signed)
Nutrition Follow-up  DOCUMENTATION CODES:   Underweight  INTERVENTION:   Boost Breeze po TID, each supplement provides 250 kcal and 9 grams of protein  Encourage PO intake  NUTRITION DIAGNOSIS:   Increased nutrient needs related to chronic illness as evidenced by estimated needs. Ongoing.   GOAL:   Patient will meet greater than or equal to 90% of their needs Progressing.   MONITOR:   PO intake, Supplement acceptance  REASON FOR ASSESSMENT:   Other (Comment)    ASSESSMENT:   Pt with PMH of Barrett's esophagus and HTN who was recenlty diagnosed with ICA aneurysm (repair was on hold due to [employer] pandemic). 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     5/13 s/p coiling of large L CC fistula   Per chart review pt met criteria for moderate malnutrition in 05/2018. Weight has continued to decrease. Pt's BMI is now underweight. Suspect malnutrition has continued or worsened. During previous admission pt did not tolerate ensure but did consumed boost breeze. Will offer while hospitalized.   Medications reviewed and include: KCl 40 mEq BID Labs reviewed: K+ 3.3 (L)    NUTRITION - FOCUSED PHYSICAL EXAM:  Deferred   Diet Order:   Diet Order            Diet NPO time specified Except for: Sips with Meds  Diet effective midnight              EDUCATION NEEDS:   No education needs have been identified at this time  Skin:  Skin Assessment: Reviewed RN Assessment(incisions)  Last BM:  5/4 - large  Height:   Ht Readings from Last 1 Encounters:  10/31/18 5' 7.5" (1.715 m)    Weight:   Wt Readings from Last 1 Encounters:  10/31/18 47.2 kg    Ideal Body Weight:  61.3 kg  BMI:  Body mass index is 16.06 kg/m.  Estimated Nutritional Needs:   Kcal:  1500-1700  Protein:  75-100 grams  Fluid:  > 1.5 L/day  Maylon Peppers RD, LDN, CNSC [phone] Pager [phone] After Hours Pager

## 2018-11-01 NOTE — Progress Notes (Signed)
PROGRESS NOTE    [name]  ZHY:[id number] DOB: February 20, 1971 DOA: 10/19/2018 PCP: Tresa Garter, MD   Brief Narrative:  48 year old female who presented with nausea, vomiting,andleft eye pain.She does have significant past medical history for Barrett's esophagus and hypertension. Was recently diagnosed with a 9 mm right ICA aneurysm which repair was on hold due to current COVID pandemic.She had mild proptosis of the left eye, subconjunctival hematoma and positive discomfort with eye movement.  -CT angiography of the head concerning for possible cavernous carotid fistula or arteriovenous shunt about the cavernous sinus. 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 Corliss Skains. -per [facility] Dr.Deveshwar -s/p staged coiling of [place] carotid fistula 5/13 -Clinically improving and stable -continue ASA  only -Physical therapy consult -Transfer out of ICU -Follow-up with ophthalmology Dr. Alben Spittle  Hypertension:  Well controlled.  -Hydralazine PRN  Hypokalemia and hypomagnesemia:  Replaced.   Anemia  -Due to acute blood loss at the sheath site yesterday and hemodilution  -Monitor hemoglobin   GERD: -tums PRN.   Leukocytosis: -likely reactive, afebrile and nontoxic -also followed by ID -workup unremarkable, CXR/UA/Blood Cx, Dopplers all negative  Anxiety:  On diazepam.  DVT prophylaxis: scd's Code Status: full code.  Family Communication: none at bedside.  Disposition Plan: Home likely tomorrow   Consultants:   IR  ID.   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 Corliss Skains.   Antimicrobials: vancomycin and zosyn since 5/11-5/13   Subjective: -Better today, reports improvement denies any new neurological  symptoms  Objective: Vitals:   11/01/18 0900 11/01/18 1000 11/01/18 1100 11/01/18 1200  BP: 120/75 121/76 116/85 112/79  Pulse: 82 82 70 66  Resp: (!) Temp:      TempSrc:      SpO2: 99% 99% 97% 97%  Weight:      Height:        Intake/Output Summary (Last 24 hours) at 11/01/2018 1312 Last data filed at 11/01/2018 1000 Gross per 24 hour  Intake 2184.98 ml  Output 1300 ml  Net 884.98 ml   Filed Weights   10/26/18 0813 10/29/18 0831 10/31/18 0801  Weight: 47.2 kg 47.2 kg 47.2 kg    Examination: Gen: Awake, Alert, Oriented X 3, no distress HEENT: Left eye with ptosis, sluggish left pupil, ecchymosis Lungs: Good air movement bilaterally, CTAB CVS: RRR,No Gallops,Rubs or new Murmurs Abd: soft, Non tender, non distended, BS present Extremities: No edema Skin: no new rashes Neuro: Left eye with ptosis, sluggish pupil, otherwise intact motor, sensation, DTR and cranial nerves  Data Reviewed: I have personally reviewed following labs and imaging studies  CBC: Recent Labs  Lab 10/29/18 0726 10/29/18 0851 10/30/18 0817 10/31/18 0530 11/01/18 0443  WBC 15.0* 15.3* 13.6* 14.3* 14.7*  NEUTROABS 11.9* 11.8* 10.2* 9.9* 10.5*  HGB 12.2 12.5 10.6* 11.5* 8.5*  HCT 35.5* 38.7 31.6* 35.0* 26.3*  MCV 87.4 90.6 88.5 90.2 93.3  PLT 356 357 336 387 281   Basic Metabolic Panel: Recent Labs  Lab 10/28/18 1002 10/29/18 0726 10/30/18 1434 10/31/18 0530 11/01/18 0443  NA 135 137 134* 139 139  K 3.7 3.3* 2.9* 3.7 3.3*  CL 103 97* 100 100 107  CO2 GLUCOSE 105* 102* 258* 98 86  BUN  7 8 13 11 11   CREATININE 0.58 0.71 0.84 0.87 0.90  CALCIUM 8.6* 9.6 9.3 9.8 8.4*   GFR: Estimated Creatinine Clearance: 57 mL/min (by C-G formula based on SCr of 0.9 mg/dL). Liver Function Tests: Recent Labs  Lab 10/26/18 1129 10/29/18 0726  AST 35 14*  ALT 32 18  ALKPHOS 49 47  BILITOT 1.4* 1.1  PROT 7.6 6.6  ALBUMIN 4.1 3.6   Recent Labs  Lab 10/26/18 1129   LIPASE 22  AMYLASE 61   No results for input(s): AMMONIA in the last 168 hours. Coagulation Profile: Recent Labs  Lab 10/29/18 0726 10/31/18 0530  INR 1.1 1.1   Cardiac Enzymes: No results for input(s): CKTOTAL, CKMB, CKMBINDEX, TROPONINI in the last 168 hours. BNP (last 3 results) No results for input(s): PROBNP in the last 8760 hours. HbA1C: No results for input(s): HGBA1C in the last 72 hours. CBG: Recent Labs  Lab 10/31/18 1600  GLUCAP 88   Lipid Profile: No results for input(s): CHOL, HDL, LDLCALC, TRIG, CHOLHDL, LDLDIRECT in the last 72 hours. Thyroid Function Tests: No results for input(s): TSH, T4TOTAL, FREET4, T3FREE, THYROIDAB in the last 72 hours. Anemia Panel: No results for input(s): VITAMINB12, FOLATE, FERRITIN, TIBC, IRON, RETICCTPCT in the last 72 hours. Sepsis Labs: Recent Labs  Lab 10/26/18 1129 10/27/18 1633  LATICACIDVEN 2.9* 1.9    Recent Results (from the past 240 hour(s))  Novel Coronavirus, NAA (hospital order; send-out to ref lab)     Status: None   Collection Time: 10/22/18  2:57 PM  Result Value Ref Range Status   SARS-CoV-2, NAA NOT DETECTED NOT DETECTED Final    Comment: (NOTE) This test was developed and its performance characteristics determined by [employer]. This test has not been FDA cleared or approved. This test has been authorized by FDA under an Emergency Use Authorization (EUA). This test is only authorized for the duration of time the declaration that circumstances exist justifying the authorization of the emergency use of in vitro diagnostic tests for detection of SARS-CoV-2 virus and/or diagnosis of COVID-19 infection under section 564(b)(1) of the Act, 21 U.S.C. 244WNU-2(V)(2), unless the authorization is terminated or revoked sooner. When diagnostic testing is negative, the possibility of a false negative result should be considered in the context of a patient's recent exposures and the presence of clinical  signs and symptoms consistent with COVID-19. An individual without symptoms of COVID-19 and who is not shedding SARS-CoV-2 virus would expect to have a negative (not detected) result in this assay. Performed  At: [facility] [address] [place], [place] [id number] Jolene Schimke MD VQ:[id number]    Coronavirus Source NASOPHARYNGEAL  Final    Comment: Performed at [facility], [address], [place], [place] [address]  Surgical pcr screen     Status: None   Collection Time: 10/23/18  6:16 AM  Result Value Ref Range Status   MRSA, PCR NEGATIVE NEGATIVE Final   Staphylococcus aureus NEGATIVE NEGATIVE Final    Comment: (NOTE) The Xpert SA Assay (FDA approved for NASAL specimens in patients 6 years of age and older), is one component of a comprehensive surveillance program. It is not intended to diagnose infection nor to guide or monitor treatment. Performed at [facility], [address], [place], [place] [address]   Culture, blood (Routine X 2) w Reflex to ID Panel     Status: None   Collection Time: 10/27/18  6:46 PM  Result Value Ref Range Status  Specimen Description BLOOD RIGHT HAND  Final   Special Requests   Final    AEROBIC BOTTLE ONLY Blood Culture results may not be optimal due to an inadequate volume of blood received in culture bottles   Culture   Final    NO GROWTH 5 DAYS Performed at [facility], [address] [place], [place] [address]    Report Status 11/01/2018 FINAL  Final  Culture, blood (Routine X 2) w Reflex to ID Panel     Status: None   Collection Time: 10/27/18  6:47 PM  Result Value Ref Range Status   Specimen Description BLOOD RIGHT THUMB  Final   Special Requests   Final    AEROBIC BOTTLE ONLY Blood Culture results may not be optimal due to an inadequate volume of blood received in culture bottles   Culture   Final    NO GROWTH 5 DAYS Performed at [facility], [address] [place], [place] [address]     Report Status 11/01/2018 FINAL  Final         Radiology Studies: No results found.      Scheduled Meds: . artificial tears   Left Eye QHS  . aspirin  81 mg Oral Daily   Or  . aspirin  81 mg Per Tube Daily  . brimonidine  1 drop Left Eye BID  . calcium carbonate  1 tablet Oral BID WC  . feeding supplement  1 Container Oral TID BM  . polyvinyl alcohol  1 drop Left Eye QID  . potassium chloride  40 mEq Oral Once   Continuous Infusions: . clevidipine       LOS: 12 days    Time spent: 25 minutes.     Zannie CovePreetha Khylah Kendra, MD [facility]  11/01/2018, 1:12 PM

## 2018-11-01 NOTE — Progress Notes (Addendum)
Patient ID: [name], female   DOB: [birth year], 48 y.o.   MRN: [id number]   Patient assessed by PA alongside Dr. 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  Exam: Rt pupil reacitve and 19mm Left pupil 4-5 mm sluggish Left eye movement still restricted bruising is better daily Ptosis significant on Left Tongue midline Face symmetrical Smile =  FROM  Left groin site clean and dry NT no hematoma   Plan: ok to move to step down per Dr Corliss Skains Rec; PT consult Continue asa 81 mg daily Pt to see Dr Baker Pierini Opthalmologist as OP Will see Dr Corliss Skains in follow up 2 weeks-- she will hear from scheduler for time and date No driving x 2 weeks No stooping; bending or lifting x 2 weeks  If pts symptoms worsen-- or develop double vision-- instructed to go to ED She has good understanding of this plan

## 2018-11-02 ENCOUNTER — Encounter (HOSPITAL_COMMUNITY): Payer: Self-pay | Admitting: Interventional Radiology

## 2018-11-02 LAB — CBC
HCT: 29.6 % — ABNORMAL LOW (ref 36.0–46.0)
Hemoglobin: 9.7 g/dL — ABNORMAL LOW (ref 12.0–15.0)
MCH: 30 pg (ref 26.0–34.0)
MCHC: 32.8 g/dL (ref 30.0–36.0)
MCV: 91.6 fL (ref 80.0–100.0)
Platelets: 310 10*3/uL (ref 150–400)
RBC: 3.23 MIL/uL — ABNORMAL LOW (ref 3.87–5.11)
RDW: 14.4 % (ref 11.5–15.5)
WBC: 12.5 10*3/uL — ABNORMAL HIGH (ref 4.0–10.5)
nRBC: 0 % (ref 0.0–0.2)

## 2018-11-02 MED ORDER — LATANOPROST 0.005 % OP SOLN: 1.0000 [drp] | Freq: Every day | OPHTHALMIC | 1 refills | Status: DC

## 2018-11-02 MED ORDER — TIMOLOL MALEATE 0.5 % OP SOLN: 1.0000 [drp] | Freq: Two times a day (BID) | OPHTHALMIC | 1 refills | Status: AC

## 2018-11-02 MED ORDER — POLYVINYL ALCOHOL 1.4 % OP SOLN: 1.0000 [drp] | Freq: Four times a day (QID) | OPHTHALMIC | 0 refills | Status: AC

## 2018-11-02 MED ORDER — ASPIRIN 81 MG PO CHEW: 81.0000 mg | CHEWABLE_TABLET | Freq: Every day | ORAL | Status: DC

## 2018-11-02 MED ORDER — BRIMONIDINE TARTRATE 0.2 % OP SOLN: 1.0000 [drp] | Freq: Three times a day (TID) | OPHTHALMIC | 1 refills | Status: DC

## 2018-11-02 NOTE — Discharge Summary (Signed)
Physician Discharge Summary  MAHASIN RIVIERE ZOX:[id number] DOB: [birth year] DOA: 10/19/2018  PCP: Cassandria Anger, MD  Admit date: 10/19/2018 Discharge date: 11/02/2018  Time spent: 35 minutes  Recommendations for Outpatient Follow-up:  PCP Dr. Alain Marion in 1 week, please check CBC at follow-up and anemia panel if hemoglobin remains low Neuro interventional radiology Dr. 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 Was recently diagnosed with a 9 mm right ICA aneurysm which repair was on hold due to current COVID pandemic.She had mild proptosis of the left eye, subconjunctival hematoma and positive discomfort with eye movement  Hospital Course:    Left ICA cavernous fistula s/p staged embolization using coils and stenting 10/23/2018 by Dr. Estanislado Pandy. -Seen by neuro interventional radiologist Dr.Deveshwar -s/p staged coiling of [place] carotid fistula 5/13 -Clinically improving and stable -Per Dr. 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 Gale Journey inpatient, she was started on artificial tears 4 times daily for exposure keratopathy and started on Alphagan, timolol  and latanoprost drops for intraocular hypertension -She will follow-up with the ophthalmologist early next week  Hypertension:  Well controlled.   Hypokalemia and hypomagnesemia:  Replaced.   Anemia  -Due to acute blood loss at the sheath site yesterday and hemodilution  -Mild improvement hemoglobin -Needs to recheck this next week  Leukocytosis: -likely reactive, afebrile and nontoxic -also followed by ID -workup unremarkable, CXR/UA/Blood Cx, Dopplers all negative -Antibiotics discontinued  Anxiety:  On diazepam.  Discharge Exam: Vitals:   11/02/18 0743 11/02/18 1133  BP: (!) 141/90 (!) 155/96  Pulse: (!) 59 78  Resp: 10 16  Temp: 98.6 F (37 C) 98 F (36.7 C)  SpO2: 95% 100%    General: AAOx3, L eye ptosis and ecchymosis Cardiovascular: S1S2/RRR Respiratory: CTAB  Discharge Instructions   Discharge Instructions    Diet - low sodium heart healthy   Complete by:  As directed    Increase activity slowly   Complete by:  As directed      Allergies as of 11/02/2018      Reactions   Guaifenesin Shortness Of Breath   Iron Anaphylaxis, Other (See Comments)   IV only   Doxepin Hives   Pseudoeph-doxylamine-dm-apap Hives   Pseudoephedrine Hives   Sulfa Antibiotics Hives   Hydrocodone-acetaminophen    UNSPECIFIED REACTION    Metoprolol Succinate Swelling   UNSPECIFIED EDEMA   Celexa [citalopram Hydrobromide] Other (See Comments)   HEADACHE    Codeine Other (See Comments)   tiredness   Hydromorphone Hcl Nausea And Vomiting   Morphine Other (See Comments)   Does not work; hallucinations and irritable   Pantoprazole Sodium Other (See Comments)   heartburn   Prednisone Rash   Tetracyclines & Related Rash  Medication List    STOP taking these medications   aspirin-acetaminophen-caffeine 250-250-65 MG tablet Commonly known as:  EXCEDRIN MIGRAINE   metroNIDAZOLE 500 MG tablet Commonly known as:  Flagyl   oseltamivir 75 MG capsule Commonly  known as:  Tamiflu     TAKE these medications   allopurinol 100 MG tablet Commonly known as:  ZYLOPRIM Take 1 tablet (100 mg total) by mouth daily. Must keep visit on 11/03/17 to get refills What changed:    when to take this  reasons to take this   aspirin 81 MG chewable tablet Chew 1 tablet (81 mg total) by mouth daily.   brimonidine 0.2 % ophthalmic solution Commonly known as:  ALPHAGAN Place 1 drop into the left eye 3 (three) times daily.   cetirizine 10 MG tablet Commonly known as:  ZYRTEC Take 10 mg by mouth daily as needed for allergies.   diazepam 5 MG tablet Commonly known as:  VALIUM TAKE 1 TABLET BY MOUTH EVERY 12 HOURS IF NEEDED FOR ANXIETY What changed:  See the new instructions.   hydrochlorothiazide 25 MG tablet Commonly known as:  HYDRODIURIL Take 25 mg by mouth daily.   Iron 27 240 (27 FE) MG tablet Generic drug:  ferrous gluconate Take 240 mg by mouth daily.   latanoprost 0.005 % ophthalmic solution Commonly known as:  XALATAN Place 1 drop into the left eye at bedtime.   MULTIVITAMIN ADULTS PO Take 1 tablet by mouth daily.   polyvinyl alcohol 1.4 % ophthalmic solution Commonly known as:  LIQUIFILM TEARS Place 1 drop into the left eye 4 (four) times daily.   potassium chloride SA 20 MEQ tablet Commonly known as:  K-DUR Take 2 tablets (40 mEq total) by mouth every morning. What changed:    how much to take  when to take this   promethazine 12.5 MG tablet Commonly known as:  PHENERGAN Take 1 tablet (12.5 mg total) by mouth every 8 (eight) hours as needed for nausea or vomiting.   timolol 0.5 % ophthalmic solution Commonly known as:  TIMOPTIC Place 1 drop into the left eye 2 (two) times daily.   Vitamin D3 50 MCG (2000 UT) Tabs Take 2,000 Units by mouth daily.      Allergies  Allergen Reactions  . Guaifenesin Shortness Of Breath  . Iron Anaphylaxis and Other (See Comments)    IV only  . Doxepin Hives  .  Pseudoeph-Doxylamine-Dm-Apap Hives  . Pseudoephedrine Hives  . Sulfa Antibiotics Hives  . Hydrocodone-Acetaminophen     UNSPECIFIED REACTION   . Metoprolol Succinate Swelling    UNSPECIFIED EDEMA  . Celexa [Citalopram Hydrobromide] Other (See Comments)    HEADACHE   . Codeine Other (See Comments)    tiredness  . Hydromorphone Hcl Nausea And Vomiting  . Morphine Other (See Comments)    Does not work; hallucinations and irritable  . Pantoprazole Sodium Other (See Comments)    heartburn  . Prednisone Rash  . Tetracyclines & Related Rash   Follow-up Information    Luanne Bras, MD Follow up in 2 week(s).   Specialties:  Interventional Radiology, Radiology Why:  Please follow-up with Dr. Estanislado Pandy in 2-3 weeks after discharge. Our schedulers will call you to set up this appointment. Contact information: [place] [address] [phone]        Plotnikov, Evie Lacks, MD. Go on 11/09/2018.   Specialty:  Internal Medicine Why:  hospital followup appt @ 10:40am Contact information: [address]  [place] [address] [phone]            The results of significant diagnostics from this hospitalization (including imaging, microbiology, ancillary and laboratory) are listed below for reference.    Significant Diagnostic Studies: Ct Angio Head W Or Wo Contrast  Result Date: 10/26/2018 CLINICAL DATA:  Right-sided paresthesia. Carotid cavernous fistula. Therapy embolization 10/23/2018 EXAM: CT ANGIOGRAPHY HEAD AND NECK TECHNIQUE: Multidetector CT imaging of the head and neck was performed using the standard protocol during bolus administration of intravenous contrast. Multiplanar CT image reconstructions and MIPs were obtained to evaluate the vascular anatomy. Carotid stenosis measurements (when applicable) are obtained utilizing NASCET criteria, using the distal internal carotid diameter as the denominator. CONTRAST:  50m OMNIPAQUE IOHEXOL 350 MG/ML SOLN  COMPARISON:  Multiple previous studies including angiography 10/23/2018 and CT 10/20/2018 FINDINGS: CT HEAD FINDINGS Brain: There is streak artifact related to embolic material at the skull base. Allowing for that, the brain has normal appearance without evidence of atrophy, infarction, mass lesion, hemorrhage, hydrocephalus or extra-axial collection. Vascular: Embolic material at the skull base as noted above. Skull: Negative Sinuses: Clear except for some layering fluid in the sphenoid sinus. Orbits: Normal Review of the MIP images confirms the above findings CTA NECK FINDINGS Aortic arch: Normal except for minimal atherosclerotic calcification at the left subclavian artery origin. Right carotid system: No atherosclerotic disease at the bifurcation. Redemonstration tortuosity and aneurysmal dilatation of the distal right ICA maximal measurements 8 mm. Left carotid system: No atherosclerotic disease at the bifurcation. Redemonstration of tortuosity of the cervical ICA with aneurysmal dilatation up to 12 mm. Vertebral arteries: Normal Skeleton: Mild cervical spondylosis. Other neck: Normal Upper chest: Upper lung emphysema.  No active process. Review of the MIP images confirms the above findings CTA HEAD FINDINGS Anterior circulation: There is extensive artifact at the skull base related to embolic material. There may also be a flow diverting stent. I do not see evidence of distal vessel stenosis or occlusion. Small vessel occlusion could be inapparent. Posterior circulation: Both vertebral arteries are patent to the basilar. No basilar pathology. Posterior circulation branch vessels appear normal. Venous sinuses: Continued prominence of the superior ophthalmic vein on the left, raising the possibility of ongoing fistulous communication. Other venous structures appear normal. Anatomic variants: None significant. Delayed phase: No abnormal enhancement. Review of the MIP images confirms the above findings IMPRESSION:  No evidence of large or medium vessel occlusion. 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 Possible flow diverting stent in place. Streak artifact limits precise evaluation in that region. Continued prominence of the superior ophthalmic vein on the left and draining veins in the skull base and upper neck region. This raises the possibility that there could be a degree of ongoing cavernous carotid fistulous communication. 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 Electronically Signed   By: MNelson ChimesM.D.   On: 10/26/2018 10:01   Ct Angio Head W Or Wo Contrast  Result Date: 10/20/2018 CLINICAL DATA:  Initial evaluation for acute headache. EXAM: CT ANGIOGRAPHY HEAD AND NECK TECHNIQUE: Multidetector CT imaging of the head and neck was performed using the standard protocol during bolus administration of intravenous contrast. Multiplanar CT image reconstructions and MIPs were obtained to evaluate the vascular anatomy. Carotid stenosis measurements (when applicable) are obtained utilizing NASCET criteria, using the distal internal carotid diameter as  the denominator. CONTRAST:  151m OMNIPAQUE IOHEXOL 350 MG/ML SOLN COMPARISON:  Prior CTA from 06/29/2018. FINDINGS: CT HEAD FINDINGS Brain: Cerebral volume stable, and within normal limits. No acute intracranial hemorrhage. No acute large vessel territory infarct. No mass lesion, midline shift or mass effect. No hydrocephalus. No extra-axial fluid collection. Vascular: No asymmetric hyperdense vessel. Skull: Scalp soft tissues demonstrate no acute finding. Calvarium intact. Sinuses: Air-fluid level noted within the dominant left sphenoid sinus. Paranasal sinuses are otherwise clear. Mastoid air cells and middle ear cavities are well pneumatized and  free of fluid. Soft tissue density within the left EAC likely reflects cerumen. Orbits: Asymmetric soft tissue stranding seen involving the intraconal fat of the left orbit (series 5, image 118). Left lateral rectus muscle appears enlarged, new from previous. Globes remain symmetric in size with normal morphology and appearance. Review of the MIP images confirms the above findings CTA NECK FINDINGS Aortic arch: Visualized aortic arch normal in caliber with normal 3 vessel morphology. Minimal plaque at the origin of the left subclavian artery. No flow-limiting stenosis about the origin of the great vessels. Subclavian arteries otherwise widely patent. Right carotid system: Right common carotid artery widely patent from its origin to the bifurcation without abnormality or stenosis. No atheromatous narrowing about the right carotid bifurcation. Multiple segments of ectasias again seen involving the mid-distal right ICA measuring up to 9 mm in maximal diameter, similar to previous. Superimposed saccular aneurysm at the distal right ICA just proximal to the skull base relatively stable measuring 8 x 7 x 8 mm (transverse by craniocaudad by AP, series 12, image 111). No dissection or stenosis. Left carotid system: Left common carotid artery patent from its origin to the bifurcation without abnormality. No atheromatous narrowing about the left bifurcation. Aneurysmal dilatation of the distal left ICA up to 12 mm, relatively similar. No dissection or stenosis. Vertebral arteries: Both of the vertebral arteries arise from the subclavian arteries. Left vertebral artery dominant. No evidence for dissection or flow-limiting stenosis. Focal ectasia up at the level of C2 measuring up to 6 mm on the left and 5 mm on the right, stable. Skeleton: Mild multilevel cervical spondylolysis without significant spinal stenosis, stable. Other neck: No other acute soft tissue abnormality within the neck. Few subcentimeter hypodense thyroid  nodules noted, of doubtful significance. Upper chest: Visualized upper chest demonstrates no acute finding. Review of the MIP images confirms the above findings CTA HEAD FINDINGS Anterior circulation: Petrous segments patent bilaterally. Cavernous/supraclinoid ICAs patent without flow-limiting stenosis or definite abnormality. Adjacent venous contamination within the cavernous sinus limits evaluation. There is asymmetric engorgement and filling of the left superior orbital vein, raising the possibility for CC fistula (series 12, image 71). Additionally, there is asymmetric filling of the left inferior petrosal sinus. A1 segments patent bilaterally. Normal anterior communicating artery. Anterior cerebral arteries well perfused to their distal aspects. M1 segments patent bilaterally. Distal MCA branches well perfused and symmetric. Posterior circulation: Left vertebral artery ectatic measuring up to 5 mm, similar to previous. Right vertebral artery diffusely hypoplastic. Posterior inferior cerebral arteries patent proximally. Basilar patent to its distal aspect. Superior cerebral arteries perfuse bilaterally. Both of the PCAs patent to their distal aspects without stenosis. Venous sinuses: Concern for left-sided CC fistula or possibly dural AVF as above. Venous sinuses otherwise grossly patent allowing for timing of the contrast bolus. Anatomic variants: Tiny right posterior communicating artery noted. No other significant anatomic variant. No appreciable aneurysm. Delayed phase: No abnormal enhancement. Review of the MIP images  confirms the above findings IMPRESSION: 1. Abnormal fat stranding involving the intraconal fat of the left orbit with associated extraocular muscle enlargement and engorgement of the left superior orbital vein. Findings concerning for possible cavernous carotid fistula or other arteriovenous shunt about the cavernous sinus. Further assessment with dedicated catheter directed arteriogram  recommended for further evaluation. 2. Otherwise stable CTA with multiple segments of ectasia involving the carotid and vertebral arteries bilaterally. 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 Again, findings may reflect fibromuscular dysplasia or underlying connective tissue disorder. 3. No large vessel occlusion, arterial dissection, or hemodynamically significant stenosis identified. 4. No other acute intracranial abnormality. Electronically Signed   By: Jeannine Boga M.D.   On: 10/20/2018 02:29   Ct Head Wo Contrast  Result Date: 10/26/2018 CLINICAL DATA:  Right-sided paresthesia. Carotid cavernous fistula. Therapy embolization 10/23/2018 EXAM: CT ANGIOGRAPHY HEAD AND NECK TECHNIQUE: Multidetector CT imaging of the head and neck was performed using the standard protocol during bolus administration of intravenous contrast. Multiplanar CT image reconstructions and MIPs were obtained to evaluate the vascular anatomy. Carotid stenosis measurements (when applicable) are obtained utilizing NASCET criteria, using the distal internal carotid diameter as the denominator. CONTRAST:  10m OMNIPAQUE IOHEXOL 350 MG/ML SOLN COMPARISON:  Multiple previous studies including angiography 10/23/2018 and CT 10/20/2018 FINDINGS: CT HEAD FINDINGS Brain: There is streak artifact related to embolic material at the skull base. Allowing for that, the brain has normal appearance without evidence of atrophy, infarction, mass lesion, hemorrhage, hydrocephalus or extra-axial collection. Vascular: Embolic material at the skull base as noted above. Skull: Negative Sinuses: Clear except for some layering fluid in the sphenoid sinus. Orbits: Normal Review of the MIP images confirms the above findings CTA NECK FINDINGS Aortic arch: Normal except for minimal atherosclerotic calcification at the left subclavian artery origin. Right carotid system: No atherosclerotic  disease at the bifurcation. Redemonstration tortuosity and aneurysmal dilatation of the distal right ICA maximal measurements 8 mm. Left carotid system: No atherosclerotic disease at the bifurcation. Redemonstration of tortuosity of the cervical ICA with aneurysmal dilatation up to 12 mm. Vertebral arteries: Normal Skeleton: Mild cervical spondylosis. Other neck: Normal Upper chest: Upper lung emphysema.  No active process. Review of the MIP images confirms the above findings CTA HEAD FINDINGS Anterior circulation: There is extensive artifact at the skull base related to embolic material. There may also be a flow diverting stent. I do not see evidence of distal vessel stenosis or occlusion. Small vessel occlusion could be inapparent. Posterior circulation: Both vertebral arteries are patent to the basilar. No basilar pathology. Posterior circulation branch vessels appear normal. Venous sinuses: Continued prominence of the superior ophthalmic vein on the left, raising the possibility of ongoing fistulous communication. Other venous structures appear normal. Anatomic variants: None significant. Delayed phase: No abnormal enhancement. Review of the MIP images confirms the above findings IMPRESSION: No evidence of large or medium vessel occlusion. 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 Possible flow diverting stent in place. Streak artifact limits precise evaluation in that region. Continued prominence of the superior ophthalmic vein on the left and draining veins in the skull base and upper neck region. This raises the possibility that there could be a degree of ongoing cavernous carotid fistulous communication. 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 Electronically  Signed   By: Nelson Chimes M.D.    On: 10/26/2018 10:01   Ct Angio Neck W Or Wo Contrast  Result Date: 10/26/2018 CLINICAL DATA:  Right-sided paresthesia. Carotid cavernous fistula. Therapy embolization 10/23/2018 EXAM: CT ANGIOGRAPHY HEAD AND NECK TECHNIQUE: Multidetector CT imaging of the head and neck was performed using the standard protocol during bolus administration of intravenous contrast. Multiplanar CT image reconstructions and MIPs were obtained to evaluate the vascular anatomy. Carotid stenosis measurements (when applicable) are obtained utilizing NASCET criteria, using the distal internal carotid diameter as the denominator. CONTRAST:  20m OMNIPAQUE IOHEXOL 350 MG/ML SOLN COMPARISON:  Multiple previous studies including angiography 10/23/2018 and CT 10/20/2018 FINDINGS: CT HEAD FINDINGS Brain: There is streak artifact related to embolic material at the skull base. Allowing for that, the brain has normal appearance without evidence of atrophy, infarction, mass lesion, hemorrhage, hydrocephalus or extra-axial collection. Vascular: Embolic material at the skull base as noted above. Skull: Negative Sinuses: Clear except for some layering fluid in the sphenoid sinus. Orbits: Normal Review of the MIP images confirms the above findings CTA NECK FINDINGS Aortic arch: Normal except for minimal atherosclerotic calcification at the left subclavian artery origin. Right carotid system: No atherosclerotic disease at the bifurcation. Redemonstration tortuosity and aneurysmal dilatation of the distal right ICA maximal measurements 8 mm. Left carotid system: No atherosclerotic disease at the bifurcation. Redemonstration of tortuosity of the cervical ICA with aneurysmal dilatation up to 12 mm. Vertebral arteries: Normal Skeleton: Mild cervical spondylosis. Other neck: Normal Upper chest: Upper lung emphysema.  No active process. Review of the MIP images confirms the above findings CTA HEAD FINDINGS Anterior circulation: There is extensive artifact at  the skull base related to embolic material. There may also be a flow diverting stent. I do not see evidence of distal vessel stenosis or occlusion. Small vessel occlusion could be inapparent. Posterior circulation: Both vertebral arteries are patent to the basilar. No basilar pathology. Posterior circulation branch vessels appear normal. Venous sinuses: Continued prominence of the superior ophthalmic vein on the left, raising the possibility of ongoing fistulous communication. Other venous structures appear normal. Anatomic variants: None significant. Delayed phase: No abnormal enhancement. Review of the MIP images confirms the above findings IMPRESSION: No evidence of large or medium vessel occlusion. 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 Possible flow diverting stent in place. Streak artifact limits precise evaluation in that region. Continued prominence of the superior ophthalmic vein on the left and draining veins in the skull base and upper neck region. This raises the possibility that there could be a degree of ongoing cavernous carotid fistulous communication. 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 Electronically Signed   By: MNelson ChimesM.D.   On: 10/26/2018 10:01   Ct Angio Neck W And/or Wo Contrast  Result Date: 10/20/2018 CLINICAL DATA:  Initial evaluation for acute headache. EXAM: CT ANGIOGRAPHY HEAD AND NECK TECHNIQUE: Multidetector CT imaging of the head and neck was performed using the standard protocol during bolus administration of intravenous contrast. Multiplanar CT image reconstructions and MIPs were obtained to evaluate the vascular anatomy. Carotid stenosis measurements (when applicable) are obtained utilizing NASCET criteria, using the distal internal carotid  diameter as the denominator. CONTRAST:  1071mOMNIPAQUE IOHEXOL 350 MG/ML SOLN COMPARISON:  Prior CTA from 06/29/2018. FINDINGS: CT HEAD FINDINGS Brain: Cerebral volume stable,  and within normal limits. No acute intracranial hemorrhage. No acute large vessel territory infarct. No mass lesion, midline shift or mass effect. No hydrocephalus. No extra-axial fluid collection. Vascular: No asymmetric hyperdense vessel. Skull: Scalp soft tissues demonstrate no acute finding. Calvarium intact. Sinuses: Air-fluid level noted within the dominant left sphenoid sinus. Paranasal sinuses are otherwise clear. Mastoid air cells and middle ear cavities are well pneumatized and free of fluid. Soft tissue density within the left EAC likely reflects cerumen. Orbits: Asymmetric soft tissue stranding seen involving the intraconal fat of the left orbit (series 5, image 118). Left lateral rectus muscle appears enlarged, new from previous. Globes remain symmetric in size with normal morphology and appearance. Review of the MIP images confirms the above findings CTA NECK FINDINGS Aortic arch: Visualized aortic arch normal in caliber with normal 3 vessel morphology. Minimal plaque at the origin of the left subclavian artery. No flow-limiting stenosis about the origin of the great vessels. Subclavian arteries otherwise widely patent. Right carotid system: Right common carotid artery widely patent from its origin to the bifurcation without abnormality or stenosis. No atheromatous narrowing about the right carotid bifurcation. Multiple segments of ectasias again seen involving the mid-distal right ICA measuring up to 9 mm in maximal diameter, similar to previous. Superimposed saccular aneurysm at the distal right ICA just proximal to the skull base relatively stable measuring 8 x 7 x 8 mm (transverse by craniocaudad by AP, series 12, image 111). No dissection or stenosis. Left carotid system: Left common carotid artery patent from its origin to  the bifurcation without abnormality. No atheromatous narrowing about the left bifurcation. Aneurysmal dilatation of the distal left ICA up to 12 mm, relatively similar. No dissection or stenosis. Vertebral arteries: Both of the vertebral arteries arise from the subclavian arteries. Left vertebral artery dominant. No evidence for dissection or flow-limiting stenosis. Focal ectasia up at the level of C2 measuring up to 6 mm on the left and 5 mm on the right, stable. Skeleton: Mild multilevel cervical spondylolysis without significant spinal stenosis, stable. Other neck: No other acute soft tissue abnormality within the neck. Few subcentimeter hypodense thyroid nodules noted, of doubtful significance. Upper chest: Visualized upper chest demonstrates no acute finding. Review of the MIP images confirms the above findings CTA HEAD FINDINGS Anterior circulation: Petrous segments patent bilaterally. Cavernous/supraclinoid ICAs patent without flow-limiting stenosis or definite abnormality. Adjacent venous contamination within the cavernous sinus limits evaluation. There is asymmetric engorgement and filling of the left superior orbital vein, raising the possibility for CC fistula (series 12, image 71). Additionally, there is asymmetric filling of the left inferior petrosal sinus. A1 segments patent bilaterally. Normal anterior communicating artery. Anterior cerebral arteries well perfused to their distal aspects. M1 segments patent bilaterally. Distal MCA branches well perfused and symmetric. Posterior circulation: Left vertebral artery ectatic measuring up to 5 mm, similar to previous. Right vertebral artery diffusely hypoplastic. Posterior inferior cerebral arteries patent proximally. Basilar patent to its distal aspect. Superior cerebral arteries perfuse bilaterally. Both of the PCAs patent to their distal aspects without stenosis. Venous sinuses: Concern for left-sided CC fistula or possibly dural AVF as above. Venous  sinuses otherwise grossly patent allowing for timing of the contrast bolus. Anatomic variants: Tiny right posterior communicating artery noted. No other significant anatomic variant. No appreciable aneurysm. Delayed phase: No abnormal enhancement. Review of the MIP images confirms the above findings IMPRESSION: 1. Abnormal fat stranding involving the intraconal fat of the left orbit with associated extraocular muscle enlargement and engorgement  of the left superior orbital vein. Findings concerning for possible cavernous carotid fistula or other arteriovenous shunt about the cavernous sinus. Further assessment with dedicated catheter directed arteriogram recommended for further evaluation. 2. Otherwise stable CTA with multiple segments of ectasia involving the carotid and vertebral arteries bilaterally. 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 Again, findings may reflect fibromuscular dysplasia or underlying connective tissue disorder. 3. No large vessel occlusion, arterial dissection, or hemodynamically significant stenosis identified. 4. No other acute intracranial abnormality. Electronically Signed   By: Jeannine Boga M.D.   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 Stage II of embolization. EXAM: TRANSCATHETER THERAPY EMBOLIZATION; RADIOLOGY EXAMINATION; IR ANGIO INTRA EXTRACRAN SEL INTERNAL CAROTID UNI LEFT MOD SED; ARTERIOGRAPHY; IR ANGIO INTRA EXTRACRAN SEL COM CAROTID INNOMINATE UNI RIGHT MOD SED COMPARISON:  Arteriograms of 10/23/2018 and CT angiogram of 10/26/2018. MEDICATIONS: Heparin 2000 units IV; No antibiotic was administered within 1 hour of the procedure. ANESTHESIA/SEDATION: General anesthesia. CONTRAST:  Isovue 300 approximately 120 mL. FLUOROSCOPY TIME:   Fluoroscopy Time: 114 minutes 6 seconds (2911 mGy). COMPLICATIONS: None immediate. TECHNIQUE: Informed written consent was obtained from the patient after a thorough discussion of the procedural risks, benefits and alternatives. All questions were addressed. Maximal Sterile Barrier Technique was utilized including caps, mask, sterile gowns, sterile gloves, sterile drape, hand hygiene and skin antiseptic. A timeout was performed prior to the initiation of the procedure. The left groin was prepped and draped in the usual sterile fashion. Thereafter using modified Seldinger technique, transfemoral access into the left common femoral artery was obtained without difficulty. Over a 0.035 inch guidewire, a 5 French Pinnacle sheath was inserted. Through this, and also over 0.035 inch guidewire, a 5 [place] JB 1 catheter was advanced to the aortic arch region and selectively positioned in the right common carotid artery, and the left common carotid artery. FINDINGS: The right common carotid arteriogram again demonstrates the right external carotid artery and its major branches to be widely patent. The right internal carotid artery at the bulb and its proximal 1/3 appears again normal. 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 Distal to this the petrous, the cavernous and the supraclinoid segments are widely patent. The right middle cerebral artery and the right anterior cerebral artery opacify into the capillary and venous phases. There is prompt cross filling via the anterior communicating artery of the left middle cerebral artery and the left anterior cerebral artery into the capillary and venous phases. Antegrade flow of blood is seen in the supraclinoid left ICA from the left internal carotid artery. The left  common carotid arteriogram demonstrates the left external carotid artery and its major branches to be widely patent. 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 Demonstrated is the large fast flow fistula of the left internal carotid artery caval cavernous region with partial opacification of the left internal carotid artery distal to the origin of the ophthalmic artery. There is partial flow noted into the left middle cerebral artery  itself. Prominent fluid again is demonstrated in the left cavernous sinus with cross-filling the inner cavernous branches of the right cavernous sinus. Subsequent flow is noted into the right inferior petrosal sinus, the right petroclival veins more prominent on the left than on the right side. Prominent opacification is noted of the left superior ophthalmic vein, the left inferior ophthalmic vein, and also the left pterygoid plexus. 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 The diagnostic JB 1 catheter in the left common carotid artery was then exchanged over a 0.035 inch 300 cm Rosen exchange guidewire for an 8 [place] 85 cm Neuron Max sheath. The guidewire was removed. Good aspiration was obtained from the hub of the Neuron Max sheath. A gentle control arteriogram performed through the sheath demonstrated no change in the extracranial or intracranial circulation. Over a 0.035 Roadrunner guidewire using biplane roadmap technique, a 6 French 126 cm Navien guide catheter was then advanced without difficulty to the horizontal petrous segment of the left internal carotid artery. The guidewire was removed. Good aspiration was obtained from the hub of the Navien guide catheter. A gentle control arteriogram demonstrated safe position the tip of the microcatheter. The Neuron Max sheath was  then advanced to the junction of the middle and distal one thirds of the left internal carotid artery. Over a 0.014 inch Softip Synchro micro guidewire, an 027 Phenom microcatheter was then advanced to the proximal cavernous segment in the left internal carotid artery. Thereafter, the wire was manipulated with the torque device with advancement of the microcatheter and also the advancement of the Navien guide catheter. However, no easy access was obtained into the previously positioned Neuroform Atlas stent. It could also not be obtained into the distal cavernous and supraclinoid left ICA. This was then replaced with an 01 Echelon microcatheter which was advanced again over a 0.014 inch Softip Synchro micro guidewire into first the inferior then the superior ophthalmic veins. After having confirmed safe position of the tip of the microcatheter dangerous anastomosis, embolization was performed with coils these two abnormally prominent draining veins from a distal to proximal position. A final control arteriogram performed following the embolization of these two veins demonstrated near complete occlusion of these two veins. The microcatheter was then directed superiorly and posteriorly to the superior petrosal sinus. Again after having confirmed safe position of tip of the microcatheter with a gentle control arteriogram, embolization of this was performed with additional coils being positioned in addition to the previously positioned coils. Control arteriogram at this time demonstrated slightly improved opacification of the internal carotid artery into the inner cavernous branch of the cavernous sinus. Advancement of coils in this region was again met with significant resistance with herniation of coil back into the cavernous sinus. With now slightly improved visualization of the internal carotid artery distally and in the cavernous sinus attempts were now remained to access the previously positioned Atlas stent into  the distal left internal carotid artery and the proximal middle cerebral artery. This was obtained using a 014 inch Chikai micro guidewire. Access over this wire was obtained with the Echelon microcatheter. This was then exchanged for a 014 inch 200 cm Transend exchange micro guidewire without difficulty. A Phenom 027 microcatheter was then advanced over the exchange micro guidewire. However, the microcatheter continued to be met with significant resistance in the caval cavernous segment at the origin of the superior ophthalmic vein. This was despite maneuvering of the Neuroform guide sheath  into the petrous cavernous junction. This approach was then abandoned. Control arteriogram performed through the 8 French Neuron Max sheath in the petrous cavernous junction continued to demonstrate perfuse shunting into the left pterygoid venous plexus, and also, into the contralateral cavernous sinus via the inner cavernous communications. Slow flow to near obliteration was noted of the superior and the ophthalmic veins. A final control arteriogram performed through the Neuron Max sheath in the left internal carotid artery petrous cavernous junction demonstrated flash filling of the distal cavernous internal carotid artery. The following coils were deployed for treatment of the left-sided carotid cavernous fistula: A 5 mm x 15 cm Axium Prime coil, a 5 mm x 15 cm helix Axium Prime coil, a 5 mm x 15 cm Axium helix coil, a 5 mm x 15 cm helix Axium Prime coil, a 5 mm x 20 cm Axium Prime coil, a 6 mm x 20 cm Axium Prime helix coil, and finally a 5 mm x 20 cm helix Axium Prime coil. The Neuron Max sheath was then retrieved into the abdominal aorta and exchanged for an 8 [place] Pinnacle sheath. The sheath was then removed with hemostasis achieved in the left common femoral puncture site with manual compression. The patient's groin site appeared soft without evidence of hematoma or bleeding. Distal pulses remained palpable in the  dorsalis pedis, and the posterior tibial regions bilaterally. Patient's ACT of around 240 seconds was then reversed partially with 7.5 mg of protamine sulfate IV. The patient's general anesthesia was then reversed and the patient was then extubated. Upon recovery, the patient complained of no nausea, vomiting or headaches. She was able to move all her extremities equally. The left ptosis remained stable as did the decreased movement of the left eyeball. The left pupil remained at 5 mm and sluggish reactive. The patient was able to proceed to the left eye unchanged. She was then transferred to neuro PACU and then neuro ICU. Overnight the patient remained stable with improved sensorium. Neurologically the following day the patient demonstrated no change in the left sided ptosis, pupils on the left side minimally reactive, and also decreased movements of left eyeball. The left groin appeared soft without evidence of hematoma. Distal pulses remained stable and palpable. 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 PLAN: Patient will be transferred to the admitting team. The patient will be seen in follow-up in the clinic in 2 weeks time for possible MRI MRA prior to the visit. Patient was informed of the possible need for a diagnostic arteriogram and possible stage III embolization. The patient will be left on aspirin 81 mg a day and maintain adequate hydration. Patient was also advised to refrain from stooping, bending or lifting weights above 10 pounds for 2 weeks. She was also advised to maintain adequate hydration, and also not to drive. She was advised that should she develop new symptoms such as eye pain, nausea, vomiting, severe headaches to call 911 and come to the emergency room. She expressed understanding and agreement with the above management plan. Electronically Signed   By: Luanne Bras M.D.   On: 11/01/2018 10:28    Ir Transcath/emboliz  Result Date: 10/25/2018 CLINICAL DATA:  Left-sided pulsatile tinnitus. Left-sided orbital chemosis, proptosis and ptosis. Abnormal CT angiogram of the head and neck. EXAM: IR ANGIO INTRA EXTRACRAN SEL INTERNAL CAROTID UNI LEFT MOD SED; ARTERIOGRAPHY; BILATERAL COMMON CAROTID AND INNOMINATE ANGIOGRAPHY; TRANSCATHETER THERAPY EMBOLIZATION COMPARISON:  CT angiogram  of the head and neck of 10/20/2018. MEDICATIONS: Heparin 1000 units IV; no antibiotic was administered within 1 hour of the procedure. ANESTHESIA/SEDATION: Versed 1 mg IV; Fentanyl 25 mcg IV. Moderate Sedation Time:  30 minutes. The patient was continuously monitored during the procedure by the interventional radiology nurse under my direct supervision. CONTRAST:  Isovue 300 approximately 60 mL. FLUOROSCOPY TIME:  Fluoroscopy Time: 12 minutes 6 seconds (747 mGy). COMPLICATIONS: None immediate. TECHNIQUE: Informed written consent was obtained from the patient after a thorough discussion of the procedural risks, benefits and alternatives. All questions were addressed. Maximal Sterile Barrier Technique was utilized including caps, mask, sterile gowns, sterile gloves, sterile drape, hand hygiene and skin antiseptic. A timeout was performed prior to the initiation of the procedure. The right groin was prepped and draped in the usual sterile fashion. Thereafter using modified Seldinger technique, transfemoral access into the right common femoral artery was obtained without difficulty. Over a 0.035 inch guidewire, a 5 French Pinnacle sheath was inserted. Through this, and also over 0.035 inch guidewire, a 5 [place] JB 1 catheter was advanced to the aortic arch region and selectively positioned in the right common carotid artery, the right subclavian artery, the left common carotid artery and the left vertebral artery. FINDINGS: Right subclavian arteriogram demonstrates a hypoplastic right vertebral artery. The vertebral artery origin is  widely patent. The vessel is seen to opacify to the cranial skull base where it is seen to opacify the right posterior-inferior cerebellar artery and the right vertebrobasilar junction. Flow is also demonstrated into the distal basilar artery with mixing of unopacified blood from the more dominant contralateral left vertebral artery. The right common carotid arteriogram demonstrates the right external carotid artery and its major branches to be widely patent. The right internal carotid artery at the bulb is widely patent. No patency is seen of the proximal 1/3 of the right internal carotid artery. Distal to this there is a fusiform smooth caliber irregularity extending to the cervical petrous junction. At the level of the distal right internal carotid artery cervical segment there is an approximately 12.3 mm x 8.2 mm aneurysm projecting inferiorly and medially. Distal to this the right internal carotid artery demonstrates wide patency. The petrous, the cavernous and the supraclinoid segments are widely patent. The right middle cerebral artery and the right anterior cerebral artery opacify into the capillary and venous phases. There is a simultaneous cross-filling via the anterior communicating artery of the left anterior cerebral artery and the left middle cerebral artery into the capillary and venous phases. Unopacified blood is seen in the region of the left internal carotid terminus and proximal left middle cerebral artery. A left common carotid arteriogram demonstrates abnormally prominent left common carotid artery. The left external carotid artery and its major branches are widely patent. The left internal carotid artery at the bulb to the level of the junction of the middle 1/3 of the left internal carotid artery is widely patent. Arising at the junction of the distal 1/3 and the middle 1/3 of the left internal carotid artery is a 16.2 mm x 11.5 mm saccular aneurysm projecting posteriorly. Distal to this the  left internal carotid artery assumes a normal caliber to the petrous segment. At the level of the caval cavernous segment of the left internal carotid artery saccular pouch is noted with immediate egress of contrast posteriorly and also medially inferiorly and also crossing the midline across the intra cavernous tributaries opacifying the contralateral right cavernous sinus and subsequently the inferior  petrosal sinus on the right side. Also demonstrated is the abnormally prominent superior petrosal sinus draining into the junction of the transverse and the left sigmoid sinus. There is spontaneous fast flow egress also inferiorly and laterally into the pterygoid plexus, and also the left inferior petrosal sinus. Also noted is abnormal prominence of the superior ophthalmic vein, and the inferior ophthalmic vein projecting anteriorly and laterally and superiorly. Distal to the caval cavernous segment, the distal cavernous and the supraclinoid segments are widely patent. However, there is significant diminution of blood supply to the left middle cerebral artery. No visible opacification of the left anterior cerebral artery is noted from the left internal carotid artery injection. Origin of the dominant left vertebral artery is widely patent. The vessel is seen to opacify to the cranial skull base. At the level of the first horizontal segment there is a 6.1 mm x 4.2 mm saccular aneurysm. Distal to this the left vertebrobasilar junction and the left posterior-inferior cerebellar artery demonstrate wide patency. The basilar artery, the posterior cerebral arteries, the superior cerebellar arteries and the anterior-inferior cerebellar arteries opacify into the capillary and venous phases. No retrograde opacification of the posterior communicating artery is seen from the left vertebral artery injection. 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 Extensive shunting of blood from the left internal carotid artery intracranially by the large fistulous communications. Cross-filling via the anterior communicating artery of the left middle cerebral artery and the left anterior cerebral artery from the right-sided internal carotid artery injection. 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 Approximately 12.3 mm x 8.2 mm saccular aneurysm arising in the posterior wall of the right internal carotid artery and distal cervical segment. PLAN: Findings reviewed with the patient and her sister. 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 Electronically Signed   By: Luanne Bras M.D.   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 Stage II of embolization. EXAM: TRANSCATHETER THERAPY EMBOLIZATION; RADIOLOGY EXAMINATION; IR ANGIO INTRA EXTRACRAN SEL INTERNAL CAROTID UNI LEFT MOD SED; ARTERIOGRAPHY; IR ANGIO INTRA EXTRACRAN SEL COM CAROTID INNOMINATE UNI RIGHT MOD SED COMPARISON:  Arteriograms of 10/23/2018 and CT angiogram of 10/26/2018. MEDICATIONS: Heparin 2000 units IV; No antibiotic was administered within 1 hour of the procedure. ANESTHESIA/SEDATION: General anesthesia. CONTRAST:  Isovue 300 approximately 120 mL. FLUOROSCOPY TIME:  Fluoroscopy Time: 114 minutes 6 seconds (2911 mGy). COMPLICATIONS: None immediate. TECHNIQUE: Informed  written consent was obtained from the patient after a thorough discussion of the procedural risks, benefits and alternatives. All questions were addressed. Maximal Sterile Barrier Technique was utilized including caps, mask, sterile gowns, sterile gloves, sterile drape, hand hygiene and skin antiseptic. A timeout was performed prior to the initiation of the procedure. The left groin was prepped and draped in the usual sterile fashion. Thereafter using modified Seldinger technique, transfemoral access into the left common femoral artery was obtained without  difficulty. Over a 0.035 inch guidewire, a 5 French Pinnacle sheath was inserted. Through this, and also over 0.035 inch guidewire, a 5 [place] JB 1 catheter was advanced to the aortic arch region and selectively positioned in the right common carotid artery, and the left common carotid artery. FINDINGS: The right common carotid arteriogram again demonstrates the right external carotid artery and its major branches to be widely patent. The right internal carotid artery at the bulb and its proximal 1/3 appears again normal. 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 Distal to this the petrous, the cavernous and the supraclinoid segments are widely patent. The right middle cerebral artery and the right anterior cerebral artery opacify into the capillary and venous phases. There is prompt cross filling via the anterior communicating artery of the left middle cerebral artery and the left anterior cerebral artery into the capillary and venous phases. Antegrade flow of blood is seen in the supraclinoid left ICA from the left internal carotid artery. The left common carotid arteriogram demonstrates the left external carotid artery and its major branches to be  widely patent. 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 Demonstrated is the large fast flow fistula of the left internal carotid artery caval cavernous region with partial opacification of the left internal carotid artery distal to the origin of the ophthalmic artery. There is partial flow noted into the left middle cerebral artery itself. Prominent fluid again is demonstrated in the left cavernous sinus with cross-filling the inner cavernous branches of the right cavernous sinus. Subsequent flow is noted into the right inferior petrosal sinus, the right petroclival veins more prominent on the left than on the right side. Prominent opacification is noted of the left superior ophthalmic vein, the left inferior ophthalmic vein, and also the left pterygoid plexus. 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 The diagnostic JB 1 catheter in the left common carotid artery was then exchanged over a 0.035 inch 300 cm Rosen exchange guidewire for an 8 [place] 85 cm Neuron Max sheath. The guidewire was removed. Good aspiration was obtained from the hub of the Neuron Max sheath. A gentle control arteriogram performed through the sheath demonstrated no change in the extracranial or intracranial circulation. Over a 0.035 Roadrunner guidewire using biplane roadmap technique, a 6 French 126 cm Navien guide catheter was then advanced without difficulty to the horizontal petrous segment of the left internal carotid artery. The guidewire was removed. Good aspiration was obtained from the hub of the Navien guide catheter. A gentle control arteriogram demonstrated safe position the tip of the microcatheter. The Neuron Max sheath was then advanced to the junction of the middle and distal one thirds of the left internal carotid artery.  Over a 0.014 inch Softip Synchro micro guidewire, an 027 Phenom microcatheter was then advanced to the proximal cavernous segment in the left internal carotid artery. Thereafter, the wire was manipulated with the torque device with advancement of the microcatheter and also the advancement of the Navien guide catheter. However, no easy access was obtained into the previously positioned Neuroform Atlas stent. It could also not be obtained into  the distal cavernous and supraclinoid left ICA. This was then replaced with an 01 Echelon microcatheter which was advanced again over a 0.014 inch Softip Synchro micro guidewire into first the inferior then the superior ophthalmic veins. After having confirmed safe position of the tip of the microcatheter dangerous anastomosis, embolization was performed with coils these two abnormally prominent draining veins from a distal to proximal position. A final control arteriogram performed following the embolization of these two veins demonstrated near complete occlusion of these two veins. The microcatheter was then directed superiorly and posteriorly to the superior petrosal sinus. Again after having confirmed safe position of tip of the microcatheter with a gentle control arteriogram, embolization of this was performed with additional coils being positioned in addition to the previously positioned coils. Control arteriogram at this time demonstrated slightly improved opacification of the internal carotid artery into the inner cavernous branch of the cavernous sinus. Advancement of coils in this region was again met with significant resistance with herniation of coil back into the cavernous sinus. With now slightly improved visualization of the internal carotid artery distally and in the cavernous sinus attempts were now remained to access the previously positioned Atlas stent into the distal left internal carotid artery and the proximal middle cerebral artery. This was obtained  using a 014 inch Chikai micro guidewire. Access over this wire was obtained with the Echelon microcatheter. This was then exchanged for a 014 inch 200 cm Transend exchange micro guidewire without difficulty. A Phenom 027 microcatheter was then advanced over the exchange micro guidewire. However, the microcatheter continued to be met with significant resistance in the caval cavernous segment at the origin of the superior ophthalmic vein. This was despite maneuvering of the Neuroform guide sheath into the petrous cavernous junction. This approach was then abandoned. Control arteriogram performed through the 8 French Neuron Max sheath in the petrous cavernous junction continued to demonstrate perfuse shunting into the left pterygoid venous plexus, and also, into the contralateral cavernous sinus via the inner cavernous communications. Slow flow to near obliteration was noted of the superior and the ophthalmic veins. A final control arteriogram performed through the Neuron Max sheath in the left internal carotid artery petrous cavernous junction demonstrated flash filling of the distal cavernous internal carotid artery. The following coils were deployed for treatment of the left-sided carotid cavernous fistula: A 5 mm x 15 cm Axium Prime coil, a 5 mm x 15 cm helix Axium Prime coil, a 5 mm x 15 cm Axium helix coil, a 5 mm x 15 cm helix Axium Prime coil, a 5 mm x 20 cm Axium Prime coil, a 6 mm x 20 cm Axium Prime helix coil, and finally a 5 mm x 20 cm helix Axium Prime coil. The Neuron Max sheath was then retrieved into the abdominal aorta and exchanged for an 8 [place] Pinnacle sheath. The sheath was then removed with hemostasis achieved in the left common femoral puncture site with manual compression. The patient's groin site appeared soft without evidence of hematoma or bleeding. Distal pulses remained palpable in the dorsalis pedis, and the posterior tibial regions bilaterally. Patient's ACT of around 240 seconds was  then reversed partially with 7.5 mg of protamine sulfate IV. The patient's general anesthesia was then reversed and the patient was then extubated. Upon recovery, the patient complained of no nausea, vomiting or headaches. She was able to move all her extremities equally. The left ptosis remained stable as did the decreased movement of the left eyeball. The  left pupil remained at 5 mm and sluggish reactive. The patient was able to proceed to the left eye unchanged. She was then transferred to neuro PACU and then neuro ICU. Overnight the patient remained stable with improved sensorium. Neurologically the following day the patient demonstrated no change in the left sided ptosis, pupils on the left side minimally reactive, and also decreased movements of left eyeball. The left groin appeared soft without evidence of hematoma. Distal pulses remained stable and palpable. 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 PLAN: Patient will be transferred to the admitting team. The patient will be seen in follow-up in the clinic in 2 weeks time for possible MRI MRA prior to the visit. Patient was informed of the possible need for a diagnostic arteriogram and possible stage III embolization. The patient will be left on aspirin 81 mg a day and maintain adequate hydration. Patient was also advised to refrain from stooping, bending or lifting weights above 10 pounds for 2 weeks. She was also advised to maintain adequate hydration, and also not to drive. She was advised that should she develop new symptoms such as eye pain, nausea, vomiting, severe headaches to call 911 and come to the emergency room. She expressed understanding and agreement with the above management plan. Electronically Signed   By: Luanne Bras M.D.   On: 11/01/2018 10:28   Ir Angiogram Follow Up Study  Result Date: 10/25/2018 CLINICAL DATA:  Left-sided pulsatile tinnitus.  Left-sided orbital chemosis, proptosis and ptosis. Abnormal CT angiogram of the head and neck. EXAM: IR ANGIO INTRA EXTRACRAN SEL INTERNAL CAROTID UNI LEFT MOD SED; ARTERIOGRAPHY; BILATERAL COMMON CAROTID AND INNOMINATE ANGIOGRAPHY; TRANSCATHETER THERAPY EMBOLIZATION COMPARISON:  CT angiogram of the head and neck of 10/20/2018. MEDICATIONS: Heparin 1000 units IV; no antibiotic was administered within 1 hour of the procedure. ANESTHESIA/SEDATION: Versed 1 mg IV; Fentanyl 25 mcg IV. Moderate Sedation Time:  30 minutes. The patient was continuously monitored during the procedure by the interventional radiology nurse under my direct supervision. CONTRAST:  Isovue 300 approximately 60 mL. FLUOROSCOPY TIME:  Fluoroscopy Time: 12 minutes 6 seconds (747 mGy). COMPLICATIONS: None immediate. TECHNIQUE: Informed written consent was obtained from the patient after a thorough discussion of the procedural risks, benefits and alternatives. All questions were addressed. Maximal Sterile Barrier Technique was utilized including caps, mask, sterile gowns, sterile gloves, sterile drape, hand hygiene and skin antiseptic. A timeout was performed prior to the initiation of the procedure. The right groin was prepped and draped in the usual sterile fashion. Thereafter using modified Seldinger technique, transfemoral access into the right common femoral artery was obtained without difficulty. Over a 0.035 inch guidewire, a 5 French Pinnacle sheath was inserted. Through this, and also over 0.035 inch guidewire, a 5 [place] JB 1 catheter was advanced to the aortic arch region and selectively positioned in the right common carotid artery, the right subclavian artery, the left common carotid artery and the left vertebral artery. FINDINGS: Right subclavian arteriogram demonstrates a hypoplastic right vertebral artery. The vertebral artery origin is widely patent. The vessel is seen to opacify to the cranial skull base where it is seen to opacify the  right posterior-inferior cerebellar artery and the right vertebrobasilar junction. Flow is also demonstrated into the distal basilar artery with mixing of unopacified blood from the more dominant contralateral left vertebral artery. The right common carotid arteriogram demonstrates the right external carotid artery and its major branches to be  widely patent. The right internal carotid artery at the bulb is widely patent. No patency is seen of the proximal 1/3 of the right internal carotid artery. Distal to this there is a fusiform smooth caliber irregularity extending to the cervical petrous junction. At the level of the distal right internal carotid artery cervical segment there is an approximately 12.3 mm x 8.2 mm aneurysm projecting inferiorly and medially. Distal to this the right internal carotid artery demonstrates wide patency. The petrous, the cavernous and the supraclinoid segments are widely patent. The right middle cerebral artery and the right anterior cerebral artery opacify into the capillary and venous phases. There is a simultaneous cross-filling via the anterior communicating artery of the left anterior cerebral artery and the left middle cerebral artery into the capillary and venous phases. Unopacified blood is seen in the region of the left internal carotid terminus and proximal left middle cerebral artery. A left common carotid arteriogram demonstrates abnormally prominent left common carotid artery. The left external carotid artery and its major branches are widely patent. The left internal carotid artery at the bulb to the level of the junction of the middle 1/3 of the left internal carotid artery is widely patent. Arising at the junction of the distal 1/3 and the middle 1/3 of the left internal carotid artery is a 16.2 mm x 11.5 mm saccular aneurysm projecting posteriorly. Distal to this the left internal carotid artery assumes a normal caliber to the petrous segment. At the level of the caval  cavernous segment of the left internal carotid artery saccular pouch is noted with immediate egress of contrast posteriorly and also medially inferiorly and also crossing the midline across the intra cavernous tributaries opacifying the contralateral right cavernous sinus and subsequently the inferior petrosal sinus on the right side. Also demonstrated is the abnormally prominent superior petrosal sinus draining into the junction of the transverse and the left sigmoid sinus. There is spontaneous fast flow egress also inferiorly and laterally into the pterygoid plexus, and also the left inferior petrosal sinus. Also noted is abnormal prominence of the superior ophthalmic vein, and the inferior ophthalmic vein projecting anteriorly and laterally and superiorly. Distal to the caval cavernous segment, the distal cavernous and the supraclinoid segments are widely patent. However, there is significant diminution of blood supply to the left middle cerebral artery. No visible opacification of the left anterior cerebral artery is noted from the left internal carotid artery injection. Origin of the dominant left vertebral artery is widely patent. The vessel is seen to opacify to the cranial skull base. At the level of the first horizontal segment there is a 6.1 mm x 4.2 mm saccular aneurysm. Distal to this the left vertebrobasilar junction and the left posterior-inferior cerebellar artery demonstrate wide patency. The basilar artery, the posterior cerebral arteries, the superior cerebellar arteries and the anterior-inferior cerebellar arteries opacify into the capillary and venous phases. No retrograde opacification of the posterior communicating artery is seen from the left vertebral artery injection. 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 Extensive shunting of blood from the left internal carotid artery intracranially by the large fistulous communications. Cross-filling via the anterior communicating artery of the left middle cerebral artery and the  left anterior cerebral artery from the right-sided internal carotid artery injection. 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 Approximately 12.3 mm x 8.2 mm saccular aneurysm arising in the posterior wall of the right internal carotid artery and distal cervical segment. PLAN: Findings reviewed with the patient and her sister. 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 Electronically Signed   By: Luanne Bras M.D.   On: 10/24/2018 15:04   Dg Chest Port 1 View  Result Date: 10/26/2018 CLINICAL DATA:  Elevated white blood cell count. EXAM: PORTABLE CHEST 1 VIEW COMPARISON:  Single-view of the chest 10/20/2018. PA and lateral chest and CT chest 07/11/2018. FINDINGS: The patient has very small bilateral pleural effusions. Lungs are clear. Heart size is normal. No pneumothorax. No acute bony abnormality. IMPRESSION: Small bilateral pleural effusions.  Lungs are clear. Electronically Signed   By: Inge Rise M.D.   On: 10/26/2018 14:25   Dg Chest Port 1 View  Result Date: 10/20/2018 CLINICAL DATA:  Hypercalcemia. EXAM: PORTABLE CHEST 1 VIEW COMPARISON:  CT chest and chest x-ray dated July 11, 2018. FINDINGS: The heart size and mediastinal contours are within normal limits. Normal pulmonary vascularity. Chronic pleural thickening at the bilateral costophrenic angles is unchanged. No focal consolidation, pleural effusion,  or pneumothorax. No acute osseous abnormality. IMPRESSION: No active disease. Electronically Signed   By: Titus Dubin M.D.   On: 10/20/2018 05:55   Dg Abd 2 Views  Result Date: 10/26/2018 CLINICAL DATA:  Nausea and vomiting EXAM: ABDOMEN - 2 VIEW COMPARISON:  None. FINDINGS: Ovoid density in the pelvis attributed to full urinary bladder. The kidneys also are still excreting contrast. Normal bowel gas pattern. No pneumoperitoneum. IMPRESSION: 1. Distended urinary bladder. 2. Normal bowel gas pattern. Electronically Signed   By: Monte Fantasia M.D.   On: 10/26/2018 10:12   Vas [place] Upper Extremity Venous Duplex  Result Date: 10/28/2018 UPPER VENOUS STUDY  Indications: bruising Performing Technologist: June Leap RDMS, RVT  Examination Guidelines: A complete evaluation includes B-mode imaging, spectral Doppler, color Doppler, and power Doppler as needed of all accessible portions of each vessel. Bilateral testing is considered an integral part of a complete examination. Limited examinations for reoccurring indications may be performed as noted.  Right Findings: +----------+------------+---------+-----------+----------+-------+ RIGHT     CompressiblePhasicitySpontaneousPropertiesSummary +----------+------------+---------+-----------+----------+-------+ Subclavian               Yes       Yes                      +----------+------------+---------+-----------+----------+-------+  Left Findings: +----------+------------+---------+-----------+----------+-------+ LEFT      CompressiblePhasicitySpontaneousPropertiesSummary +----------+------------+---------+-----------+----------+-------+ IJV           Full       Yes       Yes                      +----------+------------+---------+-----------+----------+-------+ Subclavian    Full       Yes       Yes                      +----------+------------+---------+-----------+----------+-------+ Axillary      Full       Yes       Yes                       +----------+------------+---------+-----------+----------+-------+  Brachial      Full       Yes       Yes                      +----------+------------+---------+-----------+----------+-------+ Radial        Full                                          +----------+------------+---------+-----------+----------+-------+ Ulnar         Full                                          +----------+------------+---------+-----------+----------+-------+ Cephalic      Full                                          +----------+------------+---------+-----------+----------+-------+ Basilic       Full                                          +----------+------------+---------+-----------+----------+-------+  Summary:  Right: No evidence of thrombosis in the subclavian.  Left: No evidence of deep vein thrombosis in the upper extremity. No evidence of superficial vein thrombosis in the upper extremity.  *See table(s) above for measurements and observations.  Diagnosing physician: Harold Barban MD Electronically signed by Harold Barban MD on 10/28/2018 at 2:19:20 PM.    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 Stage II of embolization. EXAM: TRANSCATHETER THERAPY EMBOLIZATION; RADIOLOGY EXAMINATION; IR ANGIO INTRA EXTRACRAN SEL INTERNAL CAROTID UNI LEFT MOD SED; ARTERIOGRAPHY; IR ANGIO INTRA EXTRACRAN SEL COM CAROTID INNOMINATE UNI RIGHT MOD SED COMPARISON:  Arteriograms of 10/23/2018 and CT angiogram of 10/26/2018. MEDICATIONS: Heparin 2000 units IV; No antibiotic was administered within 1 hour of the procedure. ANESTHESIA/SEDATION: General anesthesia. CONTRAST:  Isovue 300 approximately 120 mL. FLUOROSCOPY TIME:  Fluoroscopy Time: 114 minutes 6 seconds (2911 mGy). COMPLICATIONS: None  immediate. TECHNIQUE: Informed written consent was obtained from the patient after a thorough discussion of the procedural risks, benefits and alternatives. All questions were addressed. Maximal Sterile Barrier Technique was utilized including caps, mask, sterile gowns, sterile gloves, sterile drape, hand hygiene and skin antiseptic. A timeout was performed prior to the initiation of the procedure. The left groin was prepped and draped in the usual sterile fashion. Thereafter using modified Seldinger technique, transfemoral access into the left common femoral artery was obtained without difficulty. Over a 0.035 inch guidewire, a 5 French Pinnacle sheath was inserted. Through this, and also over 0.035 inch guidewire, a 5 [place] JB 1 catheter was advanced to the aortic arch region and selectively positioned in the right common carotid artery, and the left common carotid artery. FINDINGS: The right common carotid arteriogram again demonstrates the right external carotid artery and its major branches to be widely patent. The right internal carotid artery at the bulb and its proximal 1/3 appears again normal. Dysplastic changes with  fusiform dilatation of the mid cervical right ICA extending to the junction of the distal and middle 1/3 remain stable. 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 Distal to this the petrous, the cavernous and the supraclinoid segments are widely patent. The right middle cerebral artery and the right anterior cerebral artery opacify into the capillary and venous phases. There is prompt cross filling via the anterior communicating artery of the left middle cerebral artery and the left anterior cerebral artery into the capillary and venous phases. Antegrade flow of blood is seen in the supraclinoid left ICA from the left internal carotid artery. The left common carotid arteriogram demonstrates the left external carotid artery  and its major branches to be widely patent. 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 Demonstrated is the large fast flow fistula of the left internal carotid artery caval cavernous region with partial opacification of the left internal carotid artery distal to the origin of the ophthalmic artery. There is partial flow noted into the left middle cerebral artery itself. Prominent fluid again is demonstrated in the left cavernous sinus with cross-filling the inner cavernous branches of the right cavernous sinus. Subsequent flow is noted into the right inferior petrosal sinus, the right petroclival veins more prominent on the left than on the right side. Prominent opacification is noted of the left superior ophthalmic vein, the left inferior ophthalmic vein, and also the left pterygoid plexus. 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 The diagnostic JB 1 catheter in the left common carotid artery was then exchanged over a 0.035 inch 300 cm Rosen exchange guidewire for an 8 [place] 85 cm Neuron Max sheath. The guidewire was removed. Good aspiration was obtained from the hub of the Neuron Max sheath. A gentle control arteriogram performed through the sheath demonstrated no change in the extracranial or intracranial circulation. Over a 0.035 Roadrunner guidewire using biplane roadmap technique, a 6 [place] 126 cm Navien guide catheter was then advanced without difficulty to the horizontal petrous segment of the left internal carotid artery. The guidewire was removed. Good aspiration was obtained from the hub of the Navien guide catheter. A gentle control arteriogram demonstrated safe position the tip of the microcatheter. The Neuron Max sheath was then advanced to the junction of the middle and distal one thirds of the  left internal carotid artery. Over a 0.014 inch Softip Synchro micro guidewire, an 027 Phenom microcatheter was then advanced to the proximal cavernous segment in the left internal carotid artery. Thereafter, the wire was manipulated with the torque device with advancement of the microcatheter and also the advancement of the Navien guide catheter. However, no easy access was obtained into the previously positioned Neuroform Atlas stent. It could also not be obtained into the distal cavernous and supraclinoid left ICA. This was then replaced with an 01 Echelon microcatheter which was advanced again over a 0.014 inch Softip Synchro micro guidewire into first the inferior then the superior ophthalmic veins. After having confirmed safe position of the tip of the microcatheter dangerous anastomosis, embolization was performed with coils these two abnormally prominent draining veins from a distal to proximal position. A final control arteriogram performed following the embolization of these two veins demonstrated near complete occlusion of these two veins. The  microcatheter was then directed superiorly and posteriorly to the superior petrosal sinus. Again after having confirmed safe position of tip of the microcatheter with a gentle control arteriogram, embolization of this was performed with additional coils being positioned in addition to the previously positioned coils. Control arteriogram at this time demonstrated slightly improved opacification of the internal carotid artery into the inner cavernous branch of the cavernous sinus. Advancement of coils in this region was again met with significant resistance with herniation of coil back into the cavernous sinus. With now slightly improved visualization of the internal carotid artery distally and in the cavernous sinus attempts were now remained to access the previously positioned Atlas stent into the distal left internal carotid artery and the proximal middle cerebral  artery. This was obtained using a 014 inch Chikai micro guidewire. Access over this wire was obtained with the Echelon microcatheter. This was then exchanged for a 014 inch 200 cm Transend exchange micro guidewire without difficulty. A Phenom 027 microcatheter was then advanced over the exchange micro guidewire. However, the microcatheter continued to be met with significant resistance in the caval cavernous segment at the origin of the superior ophthalmic vein. This was despite maneuvering of the Neuroform guide sheath into the petrous cavernous junction. This approach was then abandoned. Control arteriogram performed through the 8 French Neuron Max sheath in the petrous cavernous junction continued to demonstrate perfuse shunting into the left pterygoid venous plexus, and also, into the contralateral cavernous sinus via the inner cavernous communications. Slow flow to near obliteration was noted of the superior and the ophthalmic veins. A final control arteriogram performed through the Neuron Max sheath in the left internal carotid artery petrous cavernous junction demonstrated flash filling of the distal cavernous internal carotid artery. The following coils were deployed for treatment of the left-sided carotid cavernous fistula: A 5 mm x 15 cm Axium Prime coil, a 5 mm x 15 cm helix Axium Prime coil, a 5 mm x 15 cm Axium helix coil, a 5 mm x 15 cm helix Axium Prime coil, a 5 mm x 20 cm Axium Prime coil, a 6 mm x 20 cm Axium Prime helix coil, and finally a 5 mm x 20 cm helix Axium Prime coil. The Neuron Max sheath was then retrieved into the abdominal aorta and exchanged for an 8 [place] Pinnacle sheath. The sheath was then removed with hemostasis achieved in the left common femoral puncture site with manual compression. The patient's groin site appeared soft without evidence of hematoma or bleeding. Distal pulses remained palpable in the dorsalis pedis, and the posterior tibial regions bilaterally. Patient's ACT  of around 240 seconds was then reversed partially with 7.5 mg of protamine sulfate IV. The patient's general anesthesia was then reversed and the patient was then extubated. Upon recovery, the patient complained of no nausea, vomiting or headaches. She was able to move all her extremities equally. The left ptosis remained stable as did the decreased movement of the left eyeball. The left pupil remained at 5 mm and sluggish reactive. The patient was able to proceed to the left eye unchanged. She was then transferred to neuro PACU and then neuro ICU. Overnight the patient remained stable with improved sensorium. Neurologically the following day the patient demonstrated no change in the left sided ptosis, pupils on the left side minimally reactive, and also decreased movements of left eyeball. The left groin appeared soft without evidence of hematoma. Distal pulses remained stable and palpable. 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 PLAN: Patient will be transferred to the admitting team. The patient will be seen in follow-up in the clinic in 2 weeks time for possible MRI MRA prior to the visit. Patient was informed of the possible need for a diagnostic arteriogram and possible stage III embolization. The patient will be left on aspirin 81 mg a day and maintain adequate hydration. Patient was also advised to refrain from stooping, bending or lifting weights above 10 pounds for 2 weeks. She was also advised to maintain adequate hydration, and also not to drive. She was advised that should she develop new symptoms such as eye pain, nausea, vomiting, severe headaches to call 911 and come to the emergency room. She expressed understanding and agreement with the above management plan. Electronically Signed   By: Luanne Bras M.D.   On: 11/01/2018 10:28   Ir Angio Intra Extracran Sel Com Carotid Innominate Bilat Mod Sed  Result  Date: 10/25/2018 CLINICAL DATA:  Left-sided pulsatile tinnitus. Left-sided orbital chemosis, proptosis and ptosis. Abnormal CT angiogram of the head and neck. EXAM: IR ANGIO INTRA EXTRACRAN SEL INTERNAL CAROTID UNI LEFT MOD SED; ARTERIOGRAPHY; BILATERAL COMMON CAROTID AND INNOMINATE ANGIOGRAPHY; TRANSCATHETER THERAPY EMBOLIZATION COMPARISON:  CT angiogram of the head and neck of 10/20/2018. MEDICATIONS: Heparin 1000 units IV; no antibiotic was administered within 1 hour of the procedure. ANESTHESIA/SEDATION: Versed 1 mg IV; Fentanyl 25 mcg IV. Moderate Sedation Time:  30 minutes. The patient was continuously monitored during the procedure by the interventional radiology nurse under my direct supervision. CONTRAST:  Isovue 300 approximately 60 mL. FLUOROSCOPY TIME:  Fluoroscopy Time: 12 minutes 6 seconds (747 mGy). COMPLICATIONS: None immediate. TECHNIQUE: Informed written consent was obtained from the patient after a thorough discussion of the procedural risks, benefits and alternatives. All questions were addressed. Maximal Sterile Barrier Technique was utilized including caps, mask, sterile gowns, sterile gloves, sterile drape, hand hygiene and skin antiseptic. A timeout was performed prior to the initiation of the procedure. The right groin was prepped and draped in the usual sterile fashion. Thereafter using modified Seldinger technique, transfemoral access into the right common femoral artery was obtained without difficulty. Over a 0.035 inch guidewire, a 5 French Pinnacle sheath was inserted. Through this, and also over 0.035 inch guidewire, a 5 [place] JB 1 catheter was advanced to the aortic arch region and selectively positioned in the right common carotid artery, the right subclavian artery, the left common carotid artery and the left vertebral artery. FINDINGS: Right subclavian arteriogram demonstrates a hypoplastic right vertebral artery. The vertebral artery origin is widely patent. The vessel is seen to  opacify to the cranial skull base where it is seen to opacify the right posterior-inferior cerebellar artery and the right vertebrobasilar junction. Flow is also demonstrated into the distal basilar artery with mixing of unopacified blood from the more dominant contralateral left vertebral artery. The right common carotid arteriogram demonstrates the right external carotid artery and its major branches to be widely patent. The right internal carotid artery at the bulb is widely patent. No patency is seen of the proximal 1/3 of the right internal carotid artery. Distal to this there is a fusiform smooth caliber irregularity extending to the cervical petrous junction. At the level of the distal right internal carotid artery cervical segment there is an approximately 12.3 mm x 8.2 mm aneurysm projecting inferiorly and medially. Distal to this the right internal carotid artery demonstrates wide patency. The petrous,  the cavernous and the supraclinoid segments are widely patent. The right middle cerebral artery and the right anterior cerebral artery opacify into the capillary and venous phases. There is a simultaneous cross-filling via the anterior communicating artery of the left anterior cerebral artery and the left middle cerebral artery into the capillary and venous phases. Unopacified blood is seen in the region of the left internal carotid terminus and proximal left middle cerebral artery. A left common carotid arteriogram demonstrates abnormally prominent left common carotid artery. The left external carotid artery and its major branches are widely patent. The left internal carotid artery at the bulb to the level of the junction of the middle 1/3 of the left internal carotid artery is widely patent. Arising at the junction of the distal 1/3 and the middle 1/3 of the left internal carotid artery is a 16.2 mm x 11.5 mm saccular aneurysm projecting posteriorly. Distal to this the left internal carotid artery assumes  a normal caliber to the petrous segment. At the level of the caval cavernous segment of the left internal carotid artery saccular pouch is noted with immediate egress of contrast posteriorly and also medially inferiorly and also crossing the midline across the intra cavernous tributaries opacifying the contralateral right cavernous sinus and subsequently the inferior petrosal sinus on the right side. Also demonstrated is the abnormally prominent superior petrosal sinus draining into the junction of the transverse and the left sigmoid sinus. There is spontaneous fast flow egress also inferiorly and laterally into the pterygoid plexus, and also the left inferior petrosal sinus. Also noted is abnormal prominence of the superior ophthalmic vein, and the inferior ophthalmic vein projecting anteriorly and laterally and superiorly. Distal to the caval cavernous segment, the distal cavernous and the supraclinoid segments are widely patent. However, there is significant diminution of blood supply to the left middle cerebral artery. No visible opacification of the left anterior cerebral artery is noted from the left internal carotid artery injection. Origin of the dominant left vertebral artery is widely patent. The vessel is seen to opacify to the cranial skull base. At the level of the first horizontal segment there is a 6.1 mm x 4.2 mm saccular aneurysm. Distal to this the left vertebrobasilar junction and the left posterior-inferior cerebellar artery demonstrate wide patency. The basilar artery, the posterior cerebral arteries, the superior cerebellar arteries and the anterior-inferior cerebellar arteries opacify into the capillary and venous phases. No retrograde opacification of the posterior communicating artery is seen from the left vertebral artery injection. 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 Extensive shunting of blood from the left internal carotid artery intracranially by the large fistulous communications. Cross-filling via the anterior communicating artery of the left middle cerebral artery and the left anterior cerebral artery from the right-sided internal carotid artery injection. 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 Approximately 12.3 mm x 8.2 mm saccular aneurysm arising in the posterior wall of the right internal carotid artery and distal cervical segment. PLAN: Findings reviewed with the patient and her sister. There were discussions regarding the management of the large high-flow  left internal carotid artery direct carotid cavernous fistula. Electronically Signed   By: Luanne Bras M.D.   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 Stage II of embolization. EXAM: TRANSCATHETER THERAPY EMBOLIZATION; RADIOLOGY EXAMINATION; IR ANGIO INTRA EXTRACRAN SEL INTERNAL CAROTID UNI LEFT MOD SED; ARTERIOGRAPHY; IR ANGIO INTRA EXTRACRAN SEL COM CAROTID INNOMINATE UNI RIGHT MOD SED COMPARISON:  Arteriograms of 10/23/2018 and CT angiogram of 10/26/2018. MEDICATIONS: Heparin 2000 units IV; No antibiotic was administered within 1 hour of the procedure. ANESTHESIA/SEDATION: General anesthesia. CONTRAST:  Isovue 300 approximately 120 mL. FLUOROSCOPY TIME:  Fluoroscopy Time: 114 minutes 6 seconds (2911 mGy). COMPLICATIONS: None immediate. TECHNIQUE: Informed written  consent was obtained from the patient after a thorough discussion of the procedural risks, benefits and alternatives. All questions were addressed. Maximal Sterile Barrier Technique was utilized including caps, mask, sterile gowns, sterile gloves, sterile drape, hand hygiene and skin antiseptic. A timeout was performed prior to the initiation of the procedure. The left groin was prepped and draped in the usual sterile fashion. Thereafter using modified Seldinger technique, transfemoral access into the left common femoral artery was obtained without difficulty. Over a 0.035 inch guidewire, a 5 French Pinnacle sheath was inserted. Through this, and also over 0.035 inch guidewire, a 5 [place] JB 1 catheter was advanced to the aortic arch region and selectively positioned in the right common carotid artery, and the left common carotid artery. FINDINGS: The right common carotid arteriogram again demonstrates the right external carotid artery and its major branches to be widely patent. The right internal carotid artery at the bulb and its proximal 1/3 appears again normal. 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 Distal to this the petrous, the cavernous and the supraclinoid segments are widely patent. The right middle cerebral artery and the right anterior cerebral artery opacify into the capillary and venous phases. There is prompt cross filling via the anterior communicating artery of the left middle cerebral artery and the left anterior cerebral artery into the capillary and venous phases. Antegrade flow of blood is seen in the supraclinoid left ICA from the left internal carotid artery. The left common carotid arteriogram demonstrates the left external carotid artery and its major branches to be widely  patent. 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 Demonstrated is the large fast flow fistula of the left internal carotid artery caval cavernous region with partial opacification of the left internal carotid artery distal to the origin of the ophthalmic artery. There is partial flow noted into the left middle cerebral artery itself. Prominent fluid again is demonstrated in the left cavernous sinus with cross-filling the inner cavernous branches of the right cavernous sinus. Subsequent flow is noted into the right inferior petrosal sinus, the right petroclival veins more prominent on the left than on the right side. Prominent opacification is noted of the left superior ophthalmic vein, the left inferior ophthalmic vein, and also the left pterygoid plexus. ENDOVASCULAR STAGE II EMBOLIZATION OF THE LARGE FAST FLOW LEFT  INTERNAL CAROTID ARTERY CAROTID CAVERNOUS FISTULA. The diagnostic JB 1 catheter in the left common carotid artery was then exchanged over a 0.035 inch 300 cm Rosen exchange guidewire for an 8 [place] 85 cm Neuron Max sheath. The guidewire was removed. Good aspiration was obtained from the hub of the Neuron Max sheath. A gentle control arteriogram performed through the sheath demonstrated no change in the extracranial or intracranial circulation. Over a 0.035 Roadrunner guidewire using biplane roadmap technique, a 6 French 126 cm Navien guide catheter was then advanced without difficulty to the horizontal petrous segment of the left internal carotid artery. The guidewire was removed. Good aspiration was obtained from the hub of the Navien guide catheter. A gentle control arteriogram demonstrated safe position the tip of the microcatheter. The Neuron Max sheath was then advanced to the junction of the middle and distal one thirds of the left internal carotid artery. Over a  0.014 inch Softip Synchro micro guidewire, an 027 Phenom microcatheter was then advanced to the proximal cavernous segment in the left internal carotid artery. Thereafter, the wire was manipulated with the torque device with advancement of the microcatheter and also the advancement of the Navien guide catheter. However, no easy access was obtained into the previously positioned Neuroform Atlas stent. It could also not be obtained into the distal cavernous and supraclinoid left ICA. This was then replaced with an 01 Echelon microcatheter which was advanced again over a 0.014 inch Softip Synchro micro guidewire into first the inferior then the superior ophthalmic veins. After having confirmed safe position of the tip of the microcatheter dangerous anastomosis, embolization was performed with coils these two abnormally prominent draining veins from a distal to proximal position. A final control arteriogram performed following the embolization of these two veins demonstrated near complete occlusion of these two veins. The microcatheter was then directed superiorly and posteriorly to the superior petrosal sinus. Again after having confirmed safe position of tip of the microcatheter with a gentle control arteriogram, embolization of this was performed with additional coils being positioned in addition to the previously positioned coils. Control arteriogram at this time demonstrated slightly improved opacification of the internal carotid artery into the inner cavernous branch of the cavernous sinus. Advancement of coils in this region was again met with significant resistance with herniation of coil back into the cavernous sinus. With now slightly improved visualization of the internal carotid artery distally and in the cavernous sinus attempts were now remained to access the previously positioned Atlas stent into the distal left internal carotid artery and the proximal middle cerebral artery. This was obtained using a 014  inch Chikai micro guidewire. Access over this wire was obtained with the Echelon microcatheter. This was then exchanged for a 014 inch 200 cm Transend exchange micro guidewire without difficulty. A Phenom 027 microcatheter was then advanced over the exchange micro guidewire. However, the microcatheter continued to be met with significant resistance in the caval cavernous segment at the origin of the superior ophthalmic vein. This was despite maneuvering of the Neuroform guide sheath into the petrous cavernous junction. This approach was then abandoned. Control arteriogram performed through the 8 French Neuron Max sheath in the petrous cavernous junction continued to demonstrate perfuse shunting into the left pterygoid venous plexus, and also, into the contralateral cavernous sinus via the inner cavernous communications. Slow flow to near obliteration was noted of the superior and the ophthalmic veins. A final control arteriogram performed through the Neuron Max sheath in the left internal carotid  artery petrous cavernous junction demonstrated flash filling of the distal cavernous internal carotid artery. The following coils were deployed for treatment of the left-sided carotid cavernous fistula: A 5 mm x 15 cm Axium Prime coil, a 5 mm x 15 cm helix Axium Prime coil, a 5 mm x 15 cm Axium helix coil, a 5 mm x 15 cm helix Axium Prime coil, a 5 mm x 20 cm Axium Prime coil, a 6 mm x 20 cm Axium Prime helix coil, and finally a 5 mm x 20 cm helix Axium Prime coil. The Neuron Max sheath was then retrieved into the abdominal aorta and exchanged for an 8 [place] Pinnacle sheath. The sheath was then removed with hemostasis achieved in the left common femoral puncture site with manual compression. The patient's groin site appeared soft without evidence of hematoma or bleeding. Distal pulses remained palpable in the dorsalis pedis, and the posterior tibial regions bilaterally. Patient's ACT of around 240 seconds was then reversed  partially with 7.5 mg of protamine sulfate IV. The patient's general anesthesia was then reversed and the patient was then extubated. Upon recovery, the patient complained of no nausea, vomiting or headaches. She was able to move all her extremities equally. The left ptosis remained stable as did the decreased movement of the left eyeball. The left pupil remained at 5 mm and sluggish reactive. The patient was able to proceed to the left eye unchanged. She was then transferred to neuro PACU and then neuro ICU. Overnight the patient remained stable with improved sensorium. Neurologically the following day the patient demonstrated no change in the left sided ptosis, pupils on the left side minimally reactive, and also decreased movements of left eyeball. The left groin appeared soft without evidence of hematoma. Distal pulses remained stable and palpable. 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 PLAN: Patient will be transferred to the admitting team. The patient will be seen in follow-up in the clinic in 2 weeks time for possible MRI MRA prior to the visit. Patient was informed of the possible need for a diagnostic arteriogram and possible stage III embolization. The patient will be left on aspirin 81 mg a day and maintain adequate hydration. Patient was also advised to refrain from stooping, bending or lifting weights above 10 pounds for 2 weeks. She was also advised to maintain adequate hydration, and also not to drive. She was advised that should she develop new symptoms such as eye pain, nausea, vomiting, severe headaches to call 911 and come to the emergency room. She expressed understanding and agreement with the above management plan. Electronically Signed   By: Luanne Bras M.D.   On: 11/01/2018 10:28   Ir Angio Intra Extracran Sel Internal Carotid Uni L Mod Sed  Result Date: 10/25/2018 CLINICAL DATA:  Left-sided  pulsatile tinnitus. Left-sided orbital chemosis, proptosis and ptosis. Abnormal CT angiogram of the head and neck. EXAM: IR ANGIO INTRA EXTRACRAN SEL INTERNAL CAROTID UNI LEFT MOD SED; ARTERIOGRAPHY; BILATERAL COMMON CAROTID AND INNOMINATE ANGIOGRAPHY; TRANSCATHETER THERAPY EMBOLIZATION COMPARISON:  CT angiogram of the head and neck of 10/20/2018. MEDICATIONS: Heparin 1000 units IV; no antibiotic was administered within 1 hour of the procedure. ANESTHESIA/SEDATION: Versed 1 mg IV; Fentanyl 25 mcg IV. Moderate Sedation Time:  30 minutes. The patient was continuously monitored during the procedure by the interventional radiology nurse under my direct supervision. CONTRAST:  Isovue 300 approximately 60 mL. FLUOROSCOPY TIME:  Fluoroscopy Time: 12 minutes  6 seconds (747 mGy). COMPLICATIONS: None immediate. TECHNIQUE: Informed written consent was obtained from the patient after a thorough discussion of the procedural risks, benefits and alternatives. All questions were addressed. Maximal Sterile Barrier Technique was utilized including caps, mask, sterile gowns, sterile gloves, sterile drape, hand hygiene and skin antiseptic. A timeout was performed prior to the initiation of the procedure. The right groin was prepped and draped in the usual sterile fashion. Thereafter using modified Seldinger technique, transfemoral access into the right common femoral artery was obtained without difficulty. Over a 0.035 inch guidewire, a 5 French Pinnacle sheath was inserted. Through this, and also over 0.035 inch guidewire, a 5 [place] JB 1 catheter was advanced to the aortic arch region and selectively positioned in the right common carotid artery, the right subclavian artery, the left common carotid artery and the left vertebral artery. FINDINGS: Right subclavian arteriogram demonstrates a hypoplastic right vertebral artery. The vertebral artery origin is widely patent. The vessel is seen to opacify to the cranial skull base where it is  seen to opacify the right posterior-inferior cerebellar artery and the right vertebrobasilar junction. Flow is also demonstrated into the distal basilar artery with mixing of unopacified blood from the more dominant contralateral left vertebral artery. The right common carotid arteriogram demonstrates the right external carotid artery and its major branches to be widely patent. The right internal carotid artery at the bulb is widely patent. No patency is seen of the proximal 1/3 of the right internal carotid artery. Distal to this there is a fusiform smooth caliber irregularity extending to the cervical petrous junction. At the level of the distal right internal carotid artery cervical segment there is an approximately 12.3 mm x 8.2 mm aneurysm projecting inferiorly and medially. Distal to this the right internal carotid artery demonstrates wide patency. The petrous, the cavernous and the supraclinoid segments are widely patent. The right middle cerebral artery and the right anterior cerebral artery opacify into the capillary and venous phases. There is a simultaneous cross-filling via the anterior communicating artery of the left anterior cerebral artery and the left middle cerebral artery into the capillary and venous phases. Unopacified blood is seen in the region of the left internal carotid terminus and proximal left middle cerebral artery. A left common carotid arteriogram demonstrates abnormally prominent left common carotid artery. The left external carotid artery and its major branches are widely patent. The left internal carotid artery at the bulb to the level of the junction of the middle 1/3 of the left internal carotid artery is widely patent. Arising at the junction of the distal 1/3 and the middle 1/3 of the left internal carotid artery is a 16.2 mm x 11.5 mm saccular aneurysm projecting posteriorly. Distal to this the left internal carotid artery assumes a normal caliber to the petrous segment. At  the level of the caval cavernous segment of the left internal carotid artery saccular pouch is noted with immediate egress of contrast posteriorly and also medially inferiorly and also crossing the midline across the intra cavernous tributaries opacifying the contralateral right cavernous sinus and subsequently the inferior petrosal sinus on the right side. Also demonstrated is the abnormally prominent superior petrosal sinus draining into the junction of the transverse and the left sigmoid sinus. There is spontaneous fast flow egress also inferiorly and laterally into the pterygoid plexus, and also the left inferior petrosal sinus. Also noted is abnormal prominence of the superior ophthalmic vein, and the inferior ophthalmic vein projecting anteriorly and laterally and  superiorly. Distal to the caval cavernous segment, the distal cavernous and the supraclinoid segments are widely patent. However, there is significant diminution of blood supply to the left middle cerebral artery. No visible opacification of the left anterior cerebral artery is noted from the left internal carotid artery injection. Origin of the dominant left vertebral artery is widely patent. The vessel is seen to opacify to the cranial skull base. At the level of the first horizontal segment there is a 6.1 mm x 4.2 mm saccular aneurysm. Distal to this the left vertebrobasilar junction and the left posterior-inferior cerebellar artery demonstrate wide patency. The basilar artery, the posterior cerebral arteries, the superior cerebellar arteries and the anterior-inferior cerebellar arteries opacify into the capillary and venous phases. No retrograde opacification of the posterior communicating artery is seen from the left vertebral artery injection. 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 Extensive shunting of blood from the left internal carotid artery intracranially by the large fistulous communications. Cross-filling via the anterior communicating artery of the left middle cerebral artery and the left anterior cerebral artery from the right-sided internal carotid artery injection. 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 Approximately 12.3 mm x 8.2 mm saccular aneurysm arising in the posterior wall of the right internal carotid artery and distal cervical segment. PLAN: Findings reviewed with the patient and her sister. 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 Electronically Signed   By: Luanne Bras M.D.   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  EXAM: ENDOVASCULAR STAGED EMBOLIZATION OF LEFT INTERNAL CAROTID ARTERY [facility] DIRECT FISTULA.  COMPARISON:  Diagnostic catheter arteriogram of 10/20/2018.  MEDICATIONS: Ancef 2 g IV was administered within 1 hour of the procedure.  ANESTHESIA/SEDATION: General anesthesia.  CONTRAST:  Isovue 300 approximately 120 mL.  FLUOROSCOPY TIME:  Fluoroscopy Time: 124 minutes 12 seconds (3473 mGy).  COMPLICATIONS: None immediate.  TECHNIQUE: Informed written consent was obtained from the patient after a thorough discussion of the procedural risks, benefits and alternatives. All questions were addressed. Maximal Sterile Barrier Technique was utilized including caps, mask, sterile gowns, sterile gloves, sterile drape, hand  hygiene and skin antiseptic. A timeout was performed prior to the initiation of the procedure.  The patient was then put under general anesthesia by the Department of Anesthesiology. The right groin was prepped and draped in the usual sterile fashion. Therefore, using modified Seldinger technique trans femoral access in the right common femoral artery was obtained without difficulty. Over a 0.035 inch guidewire a 5 French Pinnacle sheath was inserted. Over a 0.035 inch guidewire and a 5 [place] JB 1 catheter was advanced to the aortic arch region and selectively positioned in the right common carotid artery and the left common carotid artery.  FINDINGS: Right common carotid arteriogram again demonstrates the right external carotid artery and their branches to be widely patent.  The dysplastic upper 2/3 of the right internal carotid  artery associated with the large aneurysm is again unchanged.  The petrous, the cavernous and the supraclinoid segments are widely patent.  The right middle cerebral artery and the right anterior cerebral artery opacify into the capillary and venous phases. There is prompt filling of the anterior communicating artery of the left anterior cerebral artery, and the left middle cerebral artery into the capillary and venous phases.  The left common carotid arteriogram demonstrates the left external carotid artery and its major branches to be widely patent. The left internal carotid at the bulb again to the cranial skull base demonstrates wide patency with the large laterally positioned aneurysm unchanged at the level of the junction of the distal and the middle 1/3 of the left internal carotid artery.  Distal to this the patency of the left internal carotid artery in the petrous segment is noted.  In the caval cavernous segment again demonstrated is the large fast flow shunting of blood into the left transverse sinus and via the intra cavernous tributaries into the right cavernous sinus  and the right inferior petrosal sinus.  Fast flow is noted into the enlarged superior petrosal sinus, the left superior ophthalmic vein, the inferior ophthalmic vein, the pterygoid plexus and the left inferior petrosal sinus.  Partial flow is noted into the normal appearing distal cavernous and the left supraclinoid segments.  Partial opacification is noted of the left middle cerebral artery branches with mixing of unopacified blood from the contralateral right internal carotid artery via the anterior communicating artery.  PROCEDURE: The diagnostic JB 1 catheter in the left common carotid artery was exchanged over a 0.035 inch 300 cm Rosen exchange guidewire for an 85 cm 8 [place] Neuron Max guide catheter.  The guidewire was removed. Good aspiration obtained from the hub of the Neuron Max sheath. Gentle control arteriogram demonstrated no evidence of spasms, dissections or of intraluminal filling defects. This was then connected to continuous heparinized saline infusion.  Over a 0.035 inch Roadrunner guidewire, using biplane roadmap technique and constant fluoroscopic guidance, a 7 French 132 cm Catalyst guide catheter was then advanced without difficulty into the horizontal petrous segment of the left internal carotid artery, the guidewire was removed. Good aspiration obtained from the hub of the 7 [place] Catalyst guide catheter. A control arteriogram performed through this again demonstrated the large fast flow left internal carotid artery directed CC fistula.  Using biplane roadmap technique and constant fluoroscopic guidance over a 0.014 inch Softip Synchro micro guidewire, a 4 mm x 11 mm extra compliant Scepter balloon which had been prepped with evacuation of air from the balloon, was advanced and positioned with its distal marker into the distal cavernous segment of the left internal carotid artery. This was then left with the micro guidewire in the supraclinoid left ICA.  Through a second by bore of  a double Tuohy Borst, over a 0.014 inch Asahi micro guidewire, a 014 Echelon microcatheter was then advanced into the proximal cavernous segment of the left internal carotid artery.  Using a torque device, the micro guidewire was then gently manipulated and access was obtained into the posterior aspect of the left cavernous sinus at the origin of the superior petrosal sinus.  The guidewire was removed. Good aspiration was obtained from the hub of the Echelon microcatheter. A gentle control arteriogram performed demonstrated intra cavernous positioning of the tip of the microcatheter.  Thereafter using intermittent biplane roadmap technique and constant fluoroscopic guidance, the following coils were then advanced into the cavernous sinus.  A 7 mm x 20 cm Target XL coil, a 7 mm x 15 cm Target 360 soft coil, a 6 mm x 20 cm Target XL 360 soft coil, a 6 mm x 20 cm Target 360 soft coil, a 6 mm x 15 cm Target 360 soft coil, a 6 mm x 20 cm 360 standard Target coil, a Codman neuro stretch resistant 6 mm x 15 cm coil, and a 5 mm x 15 cm Target 360 soft coil. Each coil was advanced into the cavernous sinus using biplane roadmap technique and constant fluoroscopic guidance. Prior to the detachment of each coil, a control arteriogram was performed ensuring safe positioning of the coil. Also each coil was advanced into the cavernous sinus with the Scepter C balloon inflated with 50% contrast and 50% heparinized saline infusion using a 1 mL syringe.  Following the final coil, placement of subsequent coils was met with the coils extending into the distal cavernous segment of the left internal carotid artery.  A control arteriogram performed through the Neuron Max sheath in the left internal carotid artery continued to demonstrate egress through the pterygoid plexus on the left side and right side, into the right cavernous sinus via the intra cavernous tributaries, and also the superior and the inferior superior ophthalmic  veins. There continued to be flow into the left internal carotid artery. Access into the supraclinoid left ICA and the left middle cerebral artery was then obtained using a Headway 17 2 tip microcatheter over a 0.014 inch standard Synchro micro guidewire.  It was decided to place a 4.5 mm x 30 mm Atlas Neuroform stent in order to provide pathway for antegrade flow into the internal carotid artery intracranially. Also to provide a platform for reconstruction and possible further endovascular coiling in order to treat and eliminate or significant slow down flow through the left internal carotid artery direct CC fistula.  This stent was then deployed without difficulty with stable anchoring in the supraclinoid left ICA, and also the petrous cavernous segment of the left internal carotid artery.  A final control arteriogram performed through the 6 [place] Catalyst guide catheter in the horizontal petrous segment demonstrated significant reduced flow through the posterior half of the left cavernous sinus, and also the superolateral aspect of the left cavernous sinus. There continued to be significant shunting of blood into the bilateral inferior petrosal sinuses, the left pterygoid plexus and the left superior and inferior ophthalmic veins.  Catalyst guide catheter, and the Neuron Max sheath were then retrieved into the abdominal aorta and removed with hemostasis obtained in the right groin puncture site with manual compression. The right groin appeared soft without evidence of a hematoma or bleeding.  Distal pulses remained palpable in the dorsalis pedis, and the posterior tibial regions bilaterally at the end of the procedure.  Throughout the procedure, the patient's blood pressure and neurological status remained stable.  Patient was then extubated without difficulty. Upon recovery, the patient demonstrated no new neurological signs or symptoms.  She continued to have chemosis with sluggish reaction of the left  pupil. 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  Otherwise, neurologically, the patient remained stable without complaints of headaches, nausea or vomiting.  She was then transferred to the neuro ICU to continue on low-dose IV heparin. She was to continue with regular aspirin for the time being.  IMPRESSION: Status post staged embolization of the high-flow large left  direct left internal carotid artery CC fistula as described above using primary coils, and also stenting of the injured left internal carotid artery in the proximal cavernous segment.  PLAN: Contemplate scheduling the second stage of the embolization in next 1-2 weeks depending on the patient's clinical condition. This was discussed with the patient, and the patient's sister.   Electronically Signed   By: Luanne Bras M.D.   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 EXAM: ENDOVASCULAR STAGED EMBOLIZATION OF LEFT INTERNAL CAROTID ARTERY [facility] DIRECT FISTULA. COMPARISON:  Diagnostic catheter arteriogram of 10/20/2018. MEDICATIONS: Ancef 2 g IV was administered within 1 hour of the procedure. ANESTHESIA/SEDATION: General anesthesia. CONTRAST:  Isovue 300 approximately 120 mL. FLUOROSCOPY TIME:  Fluoroscopy Time: 124 minutes 12 seconds (3473 mGy). COMPLICATIONS: None immediate. TECHNIQUE: Informed written consent was obtained from the patient after a thorough discussion of the procedural risks, benefits and alternatives. All questions were addressed. Maximal Sterile Barrier Technique was utilized including caps, mask, sterile gowns, sterile gloves, sterile drape, hand hygiene and skin antiseptic. A timeout was performed prior to the initiation of the procedure. The patient was then put under general anesthesia by  the Department of Anesthesiology. The right groin was prepped and draped in the usual sterile fashion. Therefore, using modified Seldinger technique trans femoral access in the right common femoral artery was obtained without difficulty. Over a 0.035 inch guidewire a 5 French Pinnacle sheath was inserted. Over a 0.035 inch guidewire and a 5 [place] JB 1 catheter was advanced to the aortic arch region and selectively positioned in the right common carotid artery and the left common carotid artery. FINDINGS: Right common carotid arteriogram again demonstrates the right external carotid artery and their branches to be widely patent. 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 The petrous, the cavernous and the supraclinoid segments are widely patent. The right middle cerebral artery and the right anterior cerebral artery opacify into the capillary and venous phases. There is prompt filling of the anterior communicating artery of the left anterior cerebral artery, and the left middle cerebral artery into the capillary and venous phases. The left common carotid arteriogram demonstrates the left external carotid artery and its major branches to be widely patent. The left internal carotid at the bulb again to the cranial skull base demonstrates wide patency with the large laterally positioned aneurysm unchanged at the level of the junction of the distal and the middle 1/3 of the left internal carotid artery. Distal to this the patency of the left internal carotid artery in the petrous segment is noted. In the caval cavernous segment again demonstrated is the large fast flow shunting of blood into the left transverse sinus and via the intra cavernous tributaries into the right cavernous sinus and the right inferior petrosal sinus. Fast flow is noted into the enlarged superior petrosal sinus, the left superior ophthalmic vein, the inferior ophthalmic  vein, the pterygoid plexus and the left inferior petrosal sinus. Partial flow is noted into the normal appearing distal cavernous and the left supraclinoid segments. Partial opacification is noted of the left middle cerebral artery branches with mixing of unopacified blood from the contralateral right internal carotid artery via the anterior communicating artery. PROCEDURE: The diagnostic JB 1 catheter in the left common carotid artery was exchanged over a 0.035 inch 300 cm Constance Holster  exchange guidewire for an 85 cm 8 [place] Neuron Max guide catheter. The guidewire was removed. Good aspiration obtained from the hub of the Neuron Max sheath. Gentle control arteriogram demonstrated no evidence of spasms, dissections or of intraluminal filling defects. This was then connected to continuous heparinized saline infusion. Over a 0.035 inch Roadrunner guidewire, using biplane roadmap technique and constant fluoroscopic guidance, a 7 French 132 cm Catalyst guide catheter was then advanced without difficulty into the horizontal petrous segment of the left internal carotid artery, the guidewire was removed. Good aspiration obtained from the hub of the 7 [place] Catalyst guide catheter. A control arteriogram performed through this again demonstrated the large fast flow left internal carotid artery directed CC fistula. Using biplane roadmap technique and constant fluoroscopic guidance over a 0.014 inch Softip Synchro micro guidewire, a 4 mm x 11 mm extra compliant Scepter balloon which had been prepped with evacuation of air from the balloon, was advanced and positioned with its distal marker into the distal cavernous segment of the left internal carotid artery. This was then left with the micro guidewire in the supraclinoid left ICA. Through a second by bore of a double Tuohy Borst, over a 0.014 inch Asahi micro guidewire, a 014 Echelon microcatheter was then advanced into the proximal cavernous segment of the left internal carotid  artery. Using a torque device, the micro guidewire was then gently manipulated and access was obtained into the posterior aspect of the left cavernous sinus at the origin of the superior petrosal sinus. The guidewire was removed. Good aspiration was obtained from the hub of the Echelon microcatheter. A gentle control arteriogram performed demonstrated intra cavernous positioning of the tip of the microcatheter. Thereafter using intermittent biplane roadmap technique and constant fluoroscopic guidance, the following coils were then advanced into the cavernous sinus. A 7 mm x 20 cm Target XL coil, a 7 mm x 15 cm Target 360 soft coil, a 6 mm x 20 cm Target XL 360 soft coil, a 6 mm x 20 cm Target 360 soft coil, a 6 mm x 15 cm Target 360 soft coil, a 6 mm x 20 cm 360 standard Target coil, a Codman neuro stretch resistant 6 mm x 15 cm coil, and a 5 mm x 15 cm Target 360 soft coil. Each coil was advanced into the cavernous sinus using biplane roadmap technique and constant fluoroscopic guidance. Prior to the detachment of each coil, a control arteriogram was performed ensuring safe positioning of the coil. Also each coil was advanced into the cavernous sinus with the Scepter C balloon inflated with 50% contrast and 50% heparinized saline infusion using a 1 mL syringe. Following the final coil, placement of subsequent coils was met with the coils extending into the distal cavernous segment of the left internal carotid artery. A control arteriogram performed through the Neuron Max sheath in the left internal carotid artery continued to demonstrate egress through the pterygoid plexus on the left side and right side, into the right cavernous sinus via the intra cavernous tributaries, and also the superior and the inferior superior ophthalmic veins. There continued to be flow into the left internal carotid artery. Access into the supraclinoid left ICA and the left middle cerebral artery was then obtained using a Headway 17 2  tip microcatheter over a 0.014 inch standard Synchro micro guidewire. It was decided to place a 4.5 mm x 30 mm Atlas Neuroform stent in order to provide pathway for antegrade flow into the internal carotid artery intracranially.  Also to provide a platform for reconstruction and possible further endovascular coiling in order to treat and eliminate or significant slow down flow through the left internal carotid artery direct CC fistula. This stent was then deployed without difficulty with stable anchoring in the supraclinoid left ICA, and also the petrous cavernous segment of the left internal carotid artery. A final control arteriogram performed through the 6 [place] Catalyst guide catheter in the horizontal petrous segment demonstrated significant reduced flow through the posterior half of the left cavernous sinus, and also the superolateral aspect of the left cavernous sinus. There continued to be significant shunting of blood into the bilateral inferior petrosal sinuses, the left pterygoid plexus and the left superior and inferior ophthalmic veins. Catalyst guide catheter, and the Neuron Max sheath were then retrieved into the abdominal aorta and removed with hemostasis obtained in the right groin puncture site with manual compression. The right groin appeared soft without evidence of a hematoma or bleeding. Distal pulses remained palpable in the dorsalis pedis, and the posterior tibial regions bilaterally at the end of the procedure. Throughout the procedure, the patient's blood pressure and neurological status remained stable. Patient was then extubated without difficulty. Upon recovery, the patient demonstrated no new neurological signs or symptoms. She continued to have chemosis with sluggish reaction of the left pupil. 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 Otherwise, neurologically, the patient  remained stable without complaints of headaches, nausea or vomiting. She was then transferred to the neuro ICU to continue on low-dose IV heparin. She was to continue with regular aspirin for the time being. 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 PLAN: Contemplate scheduling the second stage of the embolization in next 1-2 weeks depending on the patient's clinical condition. This was discussed with the patient, and the patient's sister. Electronically Signed   By: Luanne Bras M.D.   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 Stage II of embolization. EXAM: TRANSCATHETER THERAPY EMBOLIZATION; RADIOLOGY EXAMINATION; IR ANGIO INTRA EXTRACRAN SEL INTERNAL CAROTID UNI LEFT MOD SED; ARTERIOGRAPHY; IR ANGIO INTRA EXTRACRAN SEL COM CAROTID INNOMINATE UNI RIGHT MOD SED COMPARISON:  Arteriograms of 10/23/2018 and CT angiogram of 10/26/2018. MEDICATIONS: Heparin 2000 units IV; No antibiotic was administered within 1 hour of the procedure. ANESTHESIA/SEDATION: General anesthesia. CONTRAST:  Isovue 300 approximately 120 mL. FLUOROSCOPY TIME:  Fluoroscopy Time: 114 minutes 6 seconds (2911 mGy). COMPLICATIONS: None immediate. TECHNIQUE: Informed written consent was obtained from the patient after a thorough discussion of the procedural risks, benefits and alternatives. All questions were addressed. Maximal Sterile Barrier Technique was utilized including caps, mask, sterile gowns, sterile gloves, sterile drape, hand hygiene and skin antiseptic. A timeout was performed prior to the initiation of the procedure. The left groin was  prepped and draped in the usual sterile fashion. Thereafter using modified Seldinger technique, transfemoral access into the left common femoral artery was obtained without difficulty. Over a 0.035 inch guidewire, a 5 French Pinnacle sheath was inserted. Through this, and also over 0.035 inch guidewire, a 5 [place] JB 1 catheter was advanced to  the aortic arch region and selectively positioned in the right common carotid artery, and the left common carotid artery. FINDINGS: The right common carotid arteriogram again demonstrates the right external carotid artery and its major branches to be widely patent. The right internal carotid artery at the bulb and its proximal 1/3 appears again normal. 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 Distal to this the petrous, the cavernous and the supraclinoid segments are widely patent. The right middle cerebral artery and the right anterior cerebral artery opacify into the capillary and venous phases. There is prompt cross filling via the anterior communicating artery of the left middle cerebral artery and the left anterior cerebral artery into the capillary and venous phases. Antegrade flow of blood is seen in the supraclinoid left ICA from the left internal carotid artery. The left common carotid arteriogram demonstrates the left external carotid artery and its major branches to be widely patent. 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 Demonstrated is the large fast flow fistula of the left internal carotid artery caval cavernous region with partial opacification of the left internal carotid artery distal to the origin of the  ophthalmic artery. There is partial flow noted into the left middle cerebral artery itself. Prominent fluid again is demonstrated in the left cavernous sinus with cross-filling the inner cavernous branches of the right cavernous sinus. Subsequent flow is noted into the right inferior petrosal sinus, the right petroclival veins more prominent on the left than on the right side. Prominent opacification is noted of the left superior ophthalmic vein, the left inferior ophthalmic vein, and also the left pterygoid plexus. 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 The diagnostic JB 1 catheter in the left common carotid artery was then exchanged over a 0.035 inch 300 cm Rosen exchange guidewire for an 8 [place] 85 cm Neuron Max sheath. The guidewire was removed. Good aspiration was obtained from the hub of the Neuron Max sheath. A gentle control arteriogram performed through the sheath demonstrated no change in the extracranial or intracranial circulation. Over a 0.035 Roadrunner guidewire using biplane roadmap technique, a 6 French 126 cm Navien guide catheter was then advanced without difficulty to the horizontal petrous segment of the left internal carotid artery. The guidewire was removed. Good aspiration was obtained from the hub of the Navien guide catheter. A gentle control arteriogram demonstrated safe position the tip of the microcatheter. The Neuron Max sheath was then advanced to the junction of the middle and distal one thirds of the left internal carotid artery. Over a 0.014 inch Softip Synchro micro guidewire, an 027 Phenom microcatheter was then advanced to the proximal cavernous segment in the left internal carotid artery. Thereafter, the wire was manipulated with the torque device with advancement of the microcatheter and also the advancement of the Navien guide catheter. However, no easy access was obtained into the previously  positioned Neuroform Atlas stent. It could also not be obtained into the distal cavernous and supraclinoid left ICA. This was then replaced with an 01 Echelon microcatheter which was advanced again over a 0.014 inch Softip Synchro micro guidewire into first  the inferior then the superior ophthalmic veins. After having confirmed safe position of the tip of the microcatheter dangerous anastomosis, embolization was performed with coils these two abnormally prominent draining veins from a distal to proximal position. A final control arteriogram performed following the embolization of these two veins demonstrated near complete occlusion of these two veins. The microcatheter was then directed superiorly and posteriorly to the superior petrosal sinus. Again after having confirmed safe position of tip of the microcatheter with a gentle control arteriogram, embolization of this was performed with additional coils being positioned in addition to the previously positioned coils. Control arteriogram at this time demonstrated slightly improved opacification of the internal carotid artery into the inner cavernous branch of the cavernous sinus. Advancement of coils in this region was again met with significant resistance with herniation of coil back into the cavernous sinus. With now slightly improved visualization of the internal carotid artery distally and in the cavernous sinus attempts were now remained to access the previously positioned Atlas stent into the distal left internal carotid artery and the proximal middle cerebral artery. This was obtained using a 014 inch Chikai micro guidewire. Access over this wire was obtained with the Echelon microcatheter. This was then exchanged for a 014 inch 200 cm Transend exchange micro guidewire without difficulty. A Phenom 027 microcatheter was then advanced over the exchange micro guidewire. However, the microcatheter continued to be met with significant resistance in the caval  cavernous segment at the origin of the superior ophthalmic vein. This was despite maneuvering of the Neuroform guide sheath into the petrous cavernous junction. This approach was then abandoned. Control arteriogram performed through the 8 French Neuron Max sheath in the petrous cavernous junction continued to demonstrate perfuse shunting into the left pterygoid venous plexus, and also, into the contralateral cavernous sinus via the inner cavernous communications. Slow flow to near obliteration was noted of the superior and the ophthalmic veins. A final control arteriogram performed through the Neuron Max sheath in the left internal carotid artery petrous cavernous junction demonstrated flash filling of the distal cavernous internal carotid artery. The following coils were deployed for treatment of the left-sided carotid cavernous fistula: A 5 mm x 15 cm Axium Prime coil, a 5 mm x 15 cm helix Axium Prime coil, a 5 mm x 15 cm Axium helix coil, a 5 mm x 15 cm helix Axium Prime coil, a 5 mm x 20 cm Axium Prime coil, a 6 mm x 20 cm Axium Prime helix coil, and finally a 5 mm x 20 cm helix Axium Prime coil. The Neuron Max sheath was then retrieved into the abdominal aorta and exchanged for an 8 [place] Pinnacle sheath. The sheath was then removed with hemostasis achieved in the left common femoral puncture site with manual compression. The patient's groin site appeared soft without evidence of hematoma or bleeding. Distal pulses remained palpable in the dorsalis pedis, and the posterior tibial regions bilaterally. Patient's ACT of around 240 seconds was then reversed partially with 7.5 mg of protamine sulfate IV. The patient's general anesthesia was then reversed and the patient was then extubated. Upon recovery, the patient complained of no nausea, vomiting or headaches. She was able to move all her extremities equally. The left ptosis remained stable as did the decreased movement of the left eyeball. The left pupil  remained at 5 mm and sluggish reactive. The patient was able to proceed to the left eye unchanged. She was then transferred to neuro PACU and then neuro  ICU. Overnight the patient remained stable with improved sensorium. Neurologically the following day the patient demonstrated no change in the left sided ptosis, pupils on the left side minimally reactive, and also decreased movements of left eyeball. The left groin appeared soft without evidence of hematoma. Distal pulses remained stable and palpable. 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 PLAN: Patient will be transferred to the admitting team. The patient will be seen in follow-up in the clinic in 2 weeks time for possible MRI MRA prior to the visit. Patient was informed of the possible need for a diagnostic arteriogram and possible stage III embolization. The patient will be left on aspirin 81 mg a day and maintain adequate hydration. Patient was also advised to refrain from stooping, bending or lifting weights above 10 pounds for 2 weeks. She was also advised to maintain adequate hydration, and also not to drive. She was advised that should she develop new symptoms such as eye pain, nausea, vomiting, severe headaches to call 911 and come to the emergency room. She expressed understanding and agreement with the above management plan. Electronically Signed   By: Luanne Bras M.D.   On: 11/01/2018 10:28    Microbiology: Recent Results (from the past 240 hour(s))  Culture, blood (Routine X 2) w Reflex to ID Panel     Status: None   Collection Time: 10/27/18  6:46 PM  Result Value Ref Range Status   Specimen Description BLOOD RIGHT HAND  Final   Special Requests   Final    AEROBIC BOTTLE ONLY Blood Culture results may not be optimal due to an inadequate volume of blood received in culture bottles   Culture   Final    NO GROWTH 5 DAYS Performed at [facility], [place] [address] [place], [place] [address]    Report Status 11/01/2018 FINAL  Final  Culture, blood (Routine X 2) w Reflex to ID Panel     Status: None   Collection Time: 10/27/18  6:47 PM  Result Value Ref Range Status   Specimen Description BLOOD RIGHT THUMB  Final   Special Requests   Final    AEROBIC BOTTLE ONLY Blood Culture results may not be optimal due to an inadequate volume of blood received in culture bottles   Culture   Final    NO GROWTH 5 DAYS Performed at [facility], [place] [address] [place], [place] [address]    Report Status 11/01/2018 FINAL  Final     Labs: Basic Metabolic Panel: Recent Labs  Lab 10/28/18 1002 10/29/18 0726 10/30/18 1434 10/31/18 0530 11/01/18 0443  NA 135 137 134* 139 139  K 3.7 3.3* 2.9* 3.7 3.3*  CL 103 97* 100 100 107  CO2 22 25 24 28 25   GLUCOSE 105* 102* 258* 98 86  BUN 7 8 13 11 11   CREATININE 0.58 0.71 0.84 0.87 0.90  CALCIUM 8.6* 9.6 9.3 9.8 8.4*   Liver Function Tests: Recent Labs  Lab 10/29/18 0726  AST 14*  ALT 18  ALKPHOS 47  BILITOT 1.1  PROT 6.6  ALBUMIN 3.6   No results for input(s): LIPASE, AMYLASE in the last 168 hours. No results for input(s): AMMONIA in the last 168 hours. CBC: Recent Labs  Lab 10/29/18 0726 10/29/18 0851 10/30/18 0817 10/31/18 0530 11/01/18 0443 11/01/18 1354 11/02/18 0345  WBC 15.0* 15.3* 13.6* 14.3* 14.7* 12.9* 12.5*  NEUTROABS 11.9* 11.8* 10.2*  9.9* 10.5*  --   --   HGB 12.2 12.5 10.6* 11.5* 8.5* 9.7* 9.7*  HCT 35.5* 38.7 31.6* 35.0* 26.3* 29.7* 29.6*  MCV 87.4 90.6 88.5 90.2 93.3 91.7 91.6  PLT 356 357 336 387 281 287 310   Cardiac Enzymes: No results for input(s): CKTOTAL, CKMB, CKMBINDEX, TROPONINI in the last 168 hours. BNP: BNP (last 3 results) No results for input(s): BNP in the last 8760 hours.  ProBNP (last 3 results) No results for input(s): PROBNP in the last 8760 hours.  CBG: Recent Labs  Lab 10/31/18 1600  GLUCAP 88        Signed:  Domenic Polite MD.  [facility] 11/02/2018, 1:24 PM

## 2018-11-02 NOTE — Progress Notes (Signed)
Patient ID: [name], female   DOB: 1970/10/26, 48 y.o.   MRN: [id number]   Embolization of large L CC fistula performed 10/31/18-- Dr Corliss Skains  For DC to home today Pt will hear from scheduler for 2 week follow up with Dr Corliss Skains To Continue ASA 81 mg daily NO Plavix for now  She denies headache; denies pain Slept better last night Placed small kleenex patch over left eye  She will follow up with Dr Alben Spittle next week   Exam: Rt pupil reacitve and 58mm Left pupil 4-5 mm sluggish Left eye movement still restricted bruising is better daily Ptosis significant on Left Tongue midline Face symmetrical Smile =  FROM  Left groin site clean and dry NT no hematoma  Dr Corliss Skains aware of status

## 2018-11-07 ENCOUNTER — Telehealth (HOSPITAL_COMMUNITY): Payer: Self-pay

## 2018-11-07 NOTE — Telephone Encounter (Signed)
Called to schedule 2 wk f/u, no answer, left vm. AW 

## 2018-11-08 ENCOUNTER — Encounter: Payer: Self-pay | Admitting: Internal Medicine

## 2018-11-09 ENCOUNTER — Ambulatory Visit: Payer: 59 | Admitting: Internal Medicine

## 2018-11-12 ENCOUNTER — Other Ambulatory Visit: Payer: Self-pay | Admitting: Internal Medicine

## 2018-11-14 ENCOUNTER — Other Ambulatory Visit: Payer: Self-pay

## 2018-11-14 ENCOUNTER — Ambulatory Visit (HOSPITAL_COMMUNITY)
Admission: RE | Admit: 2018-11-14 | Discharge: 2018-11-14 | Disposition: A | Discharge status: Home / Self Care | Payer: 59 | Source: Ambulatory Visit | Attending: Student | Admitting: Student

## 2018-11-14 DIAGNOSIS — I671 Cerebral aneurysm, nonruptured: Secondary | ICD-10-CM

## 2018-11-14 NOTE — Progress Notes (Signed)
Chief Complaint: Patient was seen in consultation today for left ICA cavernous fistula/aneurysm s/p stagedembolization.  Referring Physician(s): Etta Quill  Supervising Physician: Luanne Bras  Patient Status: [facility] - Out-pt  History of Present Illness: [name] is a 48 y.o. female with a past medical history as below, with pertinent past medical history including hypertension, migraines, pulmonary embolism 2005, DVT 2005, hiatal hernia, GERD, Barrett's esophagus, iron deficiency anemia, vitamin D deficiency, B12 deficiency, gout, anxiety, and depression. She is known to [facility] and has been followed by Dr. Estanislado Pandy since 07/2018. She first presented to our department at the request of Dr. [name] for management of bilateral ICA aneurysms. She consulted with Dr. Estanislado Pandy 08/17/2018 to discuss management options. At that time, patient decided to pursue endovascular intervention for her aneurysms. Unfortunately, due to restrictions regarding COVID-19, this has been postponed.  Patient presented to [facility] ED 10/19/2018 with complaint of tinnitus and left eye pain. 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 At that time, patient decided to pursue endovascular intervention for her left ICA cavernous fistula/aneurysm.She underwent an image-guided cerebral arteriogram with staged embolization of her left ICA cavernous fistula/aneurysm with coiling and stenting 10/23/2018 with Dr. Estanislado Pandy. Patient has remained admitted to [facility] since procedure for observation. Since procedure, it appears that patient's left eye has worsened (worsening erythema/eccymosis, subjective diplopia on lateral gaze, pupil size 4 mm). Due to this finding, it was recommended that patient undergo staged embolization while she was  still admitted. She underwent  an image-guided cerebral arteriogram with staged embolization of her left ICA cavernous fistula/aneurysm with coiling 10/31/2018 with Dr. Estanislado Pandy. She was discharged home 11/02/2018 in stable condition.  Patient presents today for follow-up regarding her recent staged embolization procedures 10/23/2018 and 10/31/2018. Patient awake and alert sitting in wheelchair. Complains of left-sided diplopia, stable since discharge. Complains of left-sided pulsatile tinnitus, stable since discharge. Complains of left-sided ptosis, stable since discharge. Denies headache, weakness, numbness/tingling, dizziness, blurred vision, hearing changes, or speech difficulty.  Patient is currently taking Aspirin 81 mg once daily.   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---   Depression    GERD (gastroesophageal reflux disease)    Hiatal hernia    History of DVT of lower extremity    08-14-2003  RIGHT LOWER EXTREMITY-- TREATED W/ COUMADIN----  per pt none since   History of palpitations    Hypertension    Idiopathic chronic gout    12-20-2017  per pt stable ,   last episode 02/ 2019   Iron deficiency anemia    Migraines    Personal history of PE (pulmonary embolism)    03/ 2005 RIGHT SIDE  --- treated w/ coumadin---  per pt none since   PONV (postoperative nausea and vomiting)    Tinea versicolor    Vitamin D deficiency     Past Surgical History:  Procedure Laterality Date   BIOPSY  11/29/2017   Procedure: BIOPSY;  Surgeon: Clarene Essex, MD;  Location: [facility];  Service: Endoscopy;;   BRONCHOSCOPY  05-20-2004  dr clance   ESOPHAGOGASTRODUODENOSCOPY (EGD) WITH PROPOFOL N/A  11/29/2017   Procedure: ESOPHAGOGASTRODUODENOSCOPY (EGD) WITH PROPOFOL;  Surgeon: Clarene Essex, MD;  Location:  [facility];  Service: Endoscopy;  Laterality: N/A;   EXPLORATORY LAPAROTOMY  AGE 89    [place]   for constipation   INSERTION OF MESH N/A 12/26/2017   Procedure: INSERTION OF BIO A MESH;  Surgeon: Ralene Ok, MD;  Location: [facility] ORS;  Service: General;  Laterality: N/A;   IR ANGIO INTRA EXTRACRAN SEL COM CAROTID INNOMINATE BILAT MOD SED  10/20/2018   IR ANGIO INTRA EXTRACRAN SEL COM CAROTID INNOMINATE UNI R MOD SED  10/31/2018   IR ANGIO INTRA EXTRACRAN SEL INTERNAL CAROTID UNI L MOD SED  10/23/2018   IR ANGIO INTRA EXTRACRAN SEL INTERNAL CAROTID UNI L MOD SED  10/31/2018   IR ANGIO VERTEBRAL SEL SUBCLAVIAN INNOMINATE UNI R MOD SED  10/20/2018   IR ANGIO VERTEBRAL SEL VERTEBRAL UNI L MOD SED  10/20/2018   IR ANGIOGRAM FOLLOW UP STUDY  10/23/2018   IR ANGIOGRAM FOLLOW UP STUDY  10/31/2018   IR NEURO EACH ADD'L AFTER BASIC UNI LEFT (MS)  10/31/2018   IR TRANSCATH/EMBOLIZ  10/23/2018   IR TRANSCATH/EMBOLIZ  10/31/2018   KNEE ARTHROSCOPY W/ ACL RECONSTRUCTION Right 09/2003   LAPAROSCOPY BILATERAL TUBAL FULGERATION AND ATTEMPTED THERMACHOICE ABLATION WITH UTERINE PERFORATION  07-17-2009   dr meisinger  [place]   right lower lobe for massive hemoptyosis   MINI-THORACOTOMY WEDGE RESECTION OF LEFT LOWER LOBE CYST Left 06-24-2004   dr Arlyce Dice [facility]   hemoptyosis   RADIOLOGY WITH ANESTHESIA N/A 10/23/2018   Procedure: IR WITH ANESTHESIA FOR EMBOLIZATION;  Surgeon: Luanne Bras, MD;  Location: [place];  Service: Radiology;  Laterality: N/A;   RADIOLOGY WITH ANESTHESIA N/A 10/31/2018   Procedure: EMBOLIZATION;  Surgeon: Luanne Bras, MD;  Location: [place];  Service: Radiology;  Laterality: N/A;    Allergies: Guaifenesin; Iron; Doxepin; Pseudoeph-doxylamine-dm-apap; Pseudoephedrine; Sulfa antibiotics; Hydrocodone-acetaminophen; Metoprolol succinate; Celexa [citalopram hydrobromide]; Codeine; Hydromorphone hcl; Morphine; Pantoprazole sodium; Prednisone; and Tetracyclines &  related  Medications: Prior to Admission medications   Medication Sig Start Date End Date Taking? Authorizing Provider  allopurinol (ZYLOPRIM) 100 MG tablet Take 1 tablet (100 mg total) by mouth daily. Must keep visit on 11/03/17 to get refills Patient taking differently: Take 100 mg by mouth daily as needed (for gout flare up). Must keep visit on 11/03/17 to get refills 11/03/17   Plotnikov, Evie Lacks, MD  aspirin 81 MG chewable tablet Chew 1 tablet (81 mg total) by mouth daily. 11/02/18   Domenic Polite, MD  brimonidine (ALPHAGAN) 0.2 % ophthalmic solution Place 1 drop into the left eye 3 (three) times daily. 11/02/18   Domenic Polite, MD  cetirizine (ZYRTEC) 10 MG tablet Take 10 mg by mouth daily as needed for allergies.    [provider]  Cholecalciferol (VITAMIN D3) 2000 units TABS Take 2,000 Units by mouth daily.     [provider]  diazepam (VALIUM) 5 MG tablet TAKE 1 TABLET BY MOUTH EVERY 12 HOURS IF NEEDED FOR ANXIETY Patient taking differently: Take 5 mg by mouth every 12 (twelve) hours as needed for anxiety. take 1 tablet by mouth every 12 hours if needed for anxiety 05/16/18   Plotnikov, Evie Lacks, MD  ferrous gluconate (IRON 27) 240 (27 FE) MG tablet Take 240 mg by mouth daily.     [provider]  hydrochlorothiazide (HYDRODIURIL) 25 MG tablet Take 25 mg by mouth daily.     [provider]  KLOR-CON M20 20 MEQ tablet TAKE 1 TABLET BY MOUTH EVERY DAY 11/13/18   Plotnikov, Evie Lacks, MD  latanoprost (XALATAN) 0.005 % ophthalmic solution Place 1 drop into the left eye at bedtime. 11/02/18   Domenic Polite, MD  Multiple Vitamins-Minerals (MULTIVITAMIN ADULTS PO) Take 1 tablet by mouth daily.     [provider]  polyvinyl alcohol (LIQUIFILM TEARS) 1.4 % ophthalmic solution Place 1 drop into the left eye 4 (four) times daily. 11/02/18   Domenic Polite, MD  promethazine (PHENERGAN) 12.5 MG tablet Take 1 tablet (12.5 mg total) by mouth every 8  (eight) hours as needed for nausea or vomiting. 10/19/18   Hoyt Koch, MD  timolol (TIMOPTIC) 0.5 % ophthalmic solution Place 1 drop into the left eye 2 (two) times daily. 11/02/18   Domenic Polite, MD     Family History  Problem Relation Age of Onset   Hypertension Other    Diabetes Other     Social History   Socioeconomic History   Marital status: Single    Spouse name: Not on file   Number of children: Not on file   Years of education: Not on file   Highest education level: Not on file  Occupational History   Not on file  Social Needs   Financial resource strain: Not on file   Food insecurity:    Worry: Not on file    Inability: Not on file   Transportation needs:    Medical: Not on file    Non-medical: Not on file  Tobacco Use   Smoking status: Never Smoker   Smokeless tobacco: Never Used  Substance and Sexual Activity   Alcohol use: Yes    Comment: rare   Drug use: No   Sexual activity: Not on file    Comment: BTL  Lifestyle   Physical activity:    Days per week: Not on file    Minutes per session: Not on file   Stress: Not on file  Relationships   Social connections:    Talks on phone: Not on file    Gets together: Not on file    Attends religious service: Not on file    Active member of club or organization: Not on file    Attends meetings of clubs or organizations: Not on file    Relationship status: Not on file  Other Topics Concern   Not on file  Social History Narrative   Not on file     Review of Systems: A 12 point ROS discussed and pertinent positives are indicated in the HPI above.  All other systems are negative.  Review of Systems  Constitutional: Negative for chills and fever.  HENT: Positive for tinnitus. Negative for hearing loss.   Eyes: Positive for visual disturbance.  Respiratory: Negative for shortness of breath and wheezing.   Cardiovascular: Negative for chest pain and palpitations.  Neurological:  Negative for dizziness, speech difficulty, weakness, numbness and headaches.  Psychiatric/Behavioral: Negative for behavioral problems and confusion.     Physical Exam Constitutional:      General: She is not in acute distress.    Appearance: Normal appearance.  Eyes:     Comments: Right pupil 2-3 mm, left pupil 4-5 mm; left eye with ptosis, restricted EOMs, and diplopia; right eye EOMs intact.  Pulmonary:     Effort: Pulmonary effort is normal. No respiratory distress.  Skin:    General: Skin is warm and dry.  Neurological:  Mental Status: She is alert and oriented to person, place, and time.  Psychiatric:        Mood and Affect: Mood normal.        Behavior: Behavior normal.        Thought Content: Thought content normal.        Judgment: Judgment normal.      Imaging: Ct Angio Head W Or Wo Contrast  Result Date: 10/26/2018 CLINICAL DATA:  Right-sided paresthesia. Carotid cavernous fistula. Therapy embolization 10/23/2018 EXAM: CT ANGIOGRAPHY HEAD AND NECK TECHNIQUE: Multidetector CT imaging of the head and neck was performed using the standard protocol during bolus administration of intravenous contrast. Multiplanar CT image reconstructions and MIPs were obtained to evaluate the vascular anatomy. Carotid stenosis measurements (when applicable) are obtained utilizing NASCET criteria, using the distal internal carotid diameter as the denominator. CONTRAST:  85m OMNIPAQUE IOHEXOL 350 MG/ML SOLN COMPARISON:  Multiple previous studies including angiography 10/23/2018 and CT 10/20/2018 FINDINGS: CT HEAD FINDINGS Brain: There is streak artifact related to embolic material at the skull base. Allowing for that, the brain has normal appearance without evidence of atrophy, infarction, mass lesion, hemorrhage, hydrocephalus or extra-axial collection. Vascular: Embolic material at the skull base as noted above. Skull: Negative Sinuses: Clear except for some layering fluid in the sphenoid sinus.  Orbits: Normal Review of the MIP images confirms the above findings CTA NECK FINDINGS Aortic arch: Normal except for minimal atherosclerotic calcification at the left subclavian artery origin. Right carotid system: No atherosclerotic disease at the bifurcation. Redemonstration tortuosity and aneurysmal dilatation of the distal right ICA maximal measurements 8 mm. Left carotid system: No atherosclerotic disease at the bifurcation. Redemonstration of tortuosity of the cervical ICA with aneurysmal dilatation up to 12 mm. Vertebral arteries: Normal Skeleton: Mild cervical spondylosis. Other neck: Normal Upper chest: Upper lung emphysema.  No active process. Review of the MIP images confirms the above findings CTA HEAD FINDINGS Anterior circulation: There is extensive artifact at the skull base related to embolic material. There may also be a flow diverting stent. I do not see evidence of distal vessel stenosis or occlusion. Small vessel occlusion could be inapparent. Posterior circulation: Both vertebral arteries are patent to the basilar. No basilar pathology. Posterior circulation branch vessels appear normal. Venous sinuses: Continued prominence of the superior ophthalmic vein on the left, raising the possibility of ongoing fistulous communication. Other venous structures appear normal. Anatomic variants: None significant. Delayed phase: No abnormal enhancement. Review of the MIP images confirms the above findings IMPRESSION: No evidence of large or medium vessel occlusion. 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 Possible flow diverting stent in place. Streak artifact limits precise evaluation in that region. Continued prominence of the superior ophthalmic vein on the left and draining veins in the skull base and upper neck region. This raises the possibility that there could be a degree of ongoing cavernous carotid  fistulous communication. 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 Electronically Signed   By: MNelson ChimesM.D.   On: 10/26/2018 10:01   Ct Angio Head W Or Wo Contrast  Result Date: 10/20/2018 CLINICAL DATA:  Initial evaluation for acute headache. EXAM: CT ANGIOGRAPHY HEAD AND NECK TECHNIQUE: Multidetector CT imaging of the head and neck was performed using the standard protocol during bolus administration of intravenous contrast. Multiplanar CT image reconstructions and MIPs  were obtained to evaluate the vascular anatomy. Carotid stenosis measurements (when applicable) are obtained utilizing NASCET criteria, using the distal internal carotid diameter as the denominator. CONTRAST:  129m OMNIPAQUE IOHEXOL 350 MG/ML SOLN COMPARISON:  Prior CTA from 06/29/2018. FINDINGS: CT HEAD FINDINGS Brain: Cerebral volume stable, and within normal limits. No acute intracranial hemorrhage. No acute large vessel territory infarct. No mass lesion, midline shift or mass effect. No hydrocephalus. No extra-axial fluid collection. Vascular: No asymmetric hyperdense vessel. Skull: Scalp soft tissues demonstrate no acute finding. Calvarium intact. Sinuses: Air-fluid level noted within the dominant left sphenoid sinus. Paranasal sinuses are otherwise clear. Mastoid air cells and middle ear cavities are well pneumatized and free of fluid. Soft tissue density within the left EAC likely reflects cerumen. Orbits: Asymmetric soft tissue stranding seen involving the intraconal fat of the left orbit (series 5, image 118). Left lateral rectus muscle appears enlarged, new from previous. Globes remain symmetric in size with normal morphology and appearance. Review of the MIP images confirms the above findings CTA NECK FINDINGS Aortic arch: Visualized aortic arch normal in caliber with normal 3 vessel morphology. Minimal  plaque at the origin of the left subclavian artery. No flow-limiting stenosis about the origin of the great vessels. Subclavian arteries otherwise widely patent. Right carotid system: Right common carotid artery widely patent from its origin to the bifurcation without abnormality or stenosis. No atheromatous narrowing about the right carotid bifurcation. Multiple segments of ectasias again seen involving the mid-distal right ICA measuring up to 9 mm in maximal diameter, similar to previous. Superimposed saccular aneurysm at the distal right ICA just proximal to the skull base relatively stable measuring 8 x 7 x 8 mm (transverse by craniocaudad by AP, series 12, image 111). No dissection or stenosis. Left carotid system: Left common carotid artery patent from its origin to the bifurcation without abnormality. No atheromatous narrowing about the left bifurcation. Aneurysmal dilatation of the distal left ICA up to 12 mm, relatively similar. No dissection or stenosis. Vertebral arteries: Both of the vertebral arteries arise from the subclavian arteries. Left vertebral artery dominant. No evidence for dissection or flow-limiting stenosis. Focal ectasia up at the level of C2 measuring up to 6 mm on the left and 5 mm on the right, stable. Skeleton: Mild multilevel cervical spondylolysis without significant spinal stenosis, stable. Other neck: No other acute soft tissue abnormality within the neck. Few subcentimeter hypodense thyroid nodules noted, of doubtful significance. Upper chest: Visualized upper chest demonstrates no acute finding. Review of the MIP images confirms the above findings CTA HEAD FINDINGS Anterior circulation: Petrous segments patent bilaterally. Cavernous/supraclinoid ICAs patent without flow-limiting stenosis or definite abnormality. Adjacent venous contamination within the cavernous sinus limits evaluation. There is asymmetric engorgement and filling of the left superior orbital vein, raising the  possibility for CC fistula (series 12, image 71). Additionally, there is asymmetric filling of the left inferior petrosal sinus. A1 segments patent bilaterally. Normal anterior communicating artery. Anterior cerebral arteries well perfused to their distal aspects. M1 segments patent bilaterally. Distal MCA branches well perfused and symmetric. Posterior circulation: Left vertebral artery ectatic measuring up to 5 mm, similar to previous. Right vertebral artery diffusely hypoplastic. Posterior inferior cerebral arteries patent proximally. Basilar patent to its distal aspect. Superior cerebral arteries perfuse bilaterally. Both of the PCAs patent to their distal aspects without stenosis. Venous sinuses: Concern for left-sided CC fistula or possibly dural AVF as above. Venous sinuses otherwise grossly patent allowing for timing of the contrast bolus. Anatomic variants:  Tiny right posterior communicating artery noted. No other significant anatomic variant. No appreciable aneurysm. Delayed phase: No abnormal enhancement. Review of the MIP images confirms the above findings IMPRESSION: 1. Abnormal fat stranding involving the intraconal fat of the left orbit with associated extraocular muscle enlargement and engorgement of the left superior orbital vein. Findings concerning for possible cavernous carotid fistula or other arteriovenous shunt about the cavernous sinus. Further assessment with dedicated catheter directed arteriogram recommended for further evaluation. 2. Otherwise stable CTA with multiple segments of ectasia involving the carotid and vertebral arteries bilaterally. 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 Again, findings may reflect fibromuscular dysplasia or underlying connective tissue disorder. 3. No large vessel occlusion, arterial dissection, or hemodynamically significant stenosis identified. 4. No other acute intracranial  abnormality. Electronically Signed   By: Jeannine Boga M.D.   On: 10/20/2018 02:29   Ct Head Wo Contrast  Result Date: 10/26/2018 CLINICAL DATA:  Right-sided paresthesia. Carotid cavernous fistula. Therapy embolization 10/23/2018 EXAM: CT ANGIOGRAPHY HEAD AND NECK TECHNIQUE: Multidetector CT imaging of the head and neck was performed using the standard protocol during bolus administration of intravenous contrast. Multiplanar CT image reconstructions and MIPs were obtained to evaluate the vascular anatomy. Carotid stenosis measurements (when applicable) are obtained utilizing NASCET criteria, using the distal internal carotid diameter as the denominator. CONTRAST:  60m OMNIPAQUE IOHEXOL 350 MG/ML SOLN COMPARISON:  Multiple previous studies including angiography 10/23/2018 and CT 10/20/2018 FINDINGS: CT HEAD FINDINGS Brain: There is streak artifact related to embolic material at the skull base. Allowing for that, the brain has normal appearance without evidence of atrophy, infarction, mass lesion, hemorrhage, hydrocephalus or extra-axial collection. Vascular: Embolic material at the skull base as noted above. Skull: Negative Sinuses: Clear except for some layering fluid in the sphenoid sinus. Orbits: Normal Review of the MIP images confirms the above findings CTA NECK FINDINGS Aortic arch: Normal except for minimal atherosclerotic calcification at the left subclavian artery origin. Right carotid system: No atherosclerotic disease at the bifurcation. Redemonstration tortuosity and aneurysmal dilatation of the distal right ICA maximal measurements 8 mm. Left carotid system: No atherosclerotic disease at the bifurcation. Redemonstration of tortuosity of the cervical ICA with aneurysmal dilatation up to 12 mm. Vertebral arteries: Normal Skeleton: Mild cervical spondylosis. Other neck: Normal Upper chest: Upper lung emphysema.  No active process. Review of the MIP images confirms the above findings CTA HEAD  FINDINGS Anterior circulation: There is extensive artifact at the skull base related to embolic material. There may also be a flow diverting stent. I do not see evidence of distal vessel stenosis or occlusion. Small vessel occlusion could be inapparent. Posterior circulation: Both vertebral arteries are patent to the basilar. No basilar pathology. Posterior circulation branch vessels appear normal. Venous sinuses: Continued prominence of the superior ophthalmic vein on the left, raising the possibility of ongoing fistulous communication. Other venous structures appear normal. Anatomic variants: None significant. Delayed phase: No abnormal enhancement. Review of the MIP images confirms the above findings IMPRESSION: No evidence of large or medium vessel occlusion. 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 Possible flow diverting stent in place. Streak artifact limits precise evaluation in that region. Continued prominence of the superior ophthalmic vein on the left and draining veins in the skull base and upper neck region. This raises the possibility that there could be a degree of ongoing cavernous carotid  fistulous communication. 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 Electronically Signed   By: Nelson Chimes M.D.   On: 10/26/2018 10:01   Ct Angio Neck W Or Wo Contrast  Result Date: 10/26/2018 CLINICAL DATA:  Right-sided paresthesia. Carotid cavernous fistula. Therapy embolization 10/23/2018 EXAM: CT ANGIOGRAPHY HEAD AND NECK TECHNIQUE: Multidetector CT imaging of the head and neck was performed using the standard protocol during bolus administration of intravenous contrast. Multiplanar CT image reconstructions and MIPs were obtained to evaluate the vascular anatomy. Carotid stenosis  measurements (when applicable) are obtained utilizing NASCET criteria, using the distal internal carotid diameter as the denominator. CONTRAST:  13m OMNIPAQUE IOHEXOL 350 MG/ML SOLN COMPARISON:  Multiple previous studies including angiography 10/23/2018 and CT 10/20/2018 FINDINGS: CT HEAD FINDINGS Brain: There is streak artifact related to embolic material at the skull base. Allowing for that, the brain has normal appearance without evidence of atrophy, infarction, mass lesion, hemorrhage, hydrocephalus or extra-axial collection. Vascular: Embolic material at the skull base as noted above. Skull: Negative Sinuses: Clear except for some layering fluid in the sphenoid sinus. Orbits: Normal Review of the MIP images confirms the above findings CTA NECK FINDINGS Aortic arch: Normal except for minimal atherosclerotic calcification at the left subclavian artery origin. Right carotid system: No atherosclerotic disease at the bifurcation. Redemonstration tortuosity and aneurysmal dilatation of the distal right ICA maximal measurements 8 mm. Left carotid system: No atherosclerotic disease at the bifurcation. Redemonstration of tortuosity of the cervical ICA with aneurysmal dilatation up to 12 mm. Vertebral arteries: Normal Skeleton: Mild cervical spondylosis. Other neck: Normal Upper chest: Upper lung emphysema.  No active process. Review of the MIP images confirms the above findings CTA HEAD FINDINGS Anterior circulation: There is extensive artifact at the skull base related to embolic material. There may also be a flow diverting stent. I do not see evidence of distal vessel stenosis or occlusion. Small vessel occlusion could be inapparent. Posterior circulation: Both vertebral arteries are patent to the basilar. No basilar pathology. Posterior circulation branch vessels appear normal. Venous sinuses: Continued prominence of the superior ophthalmic vein on the left, raising the possibility of ongoing fistulous  communication. Other venous structures appear normal. Anatomic variants: None significant. Delayed phase: No abnormal enhancement. Review of the MIP images confirms the above findings IMPRESSION: No evidence of large or medium vessel occlusion. 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 Possible flow diverting stent in place. Streak artifact limits precise evaluation in that region. Continued prominence of the superior ophthalmic vein on the left and draining veins in the skull base and upper neck region. This raises the possibility that there could be a degree of ongoing cavernous carotid fistulous communication. 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 Electronically Signed   By: MNelson ChimesM.D.   On: 10/26/2018 10:01   Ct Angio Neck W And/or Wo Contrast  Result Date: 10/20/2018 CLINICAL DATA:  Initial evaluation for acute headache. EXAM: CT ANGIOGRAPHY HEAD AND NECK TECHNIQUE: Multidetector CT imaging of the head and neck was performed using the standard protocol during bolus administration of intravenous contrast. Multiplanar CT image reconstructions and MIPs were obtained to evaluate the vascular anatomy. Carotid stenosis measurements (when applicable) are obtained utilizing NASCET criteria, using the distal internal carotid diameter as the  denominator. CONTRAST:  17m OMNIPAQUE IOHEXOL 350 MG/ML SOLN COMPARISON:  Prior CTA from 06/29/2018. FINDINGS: CT HEAD FINDINGS Brain: Cerebral volume stable, and within normal limits. No acute intracranial hemorrhage. No acute large vessel territory infarct. No mass lesion, midline shift or mass effect. No hydrocephalus. No extra-axial fluid collection. Vascular: No asymmetric hyperdense vessel. Skull: Scalp soft tissues demonstrate no acute finding.  Calvarium intact. Sinuses: Air-fluid level noted within the dominant left sphenoid sinus. Paranasal sinuses are otherwise clear. Mastoid air cells and middle ear cavities are well pneumatized and free of fluid. Soft tissue density within the left EAC likely reflects cerumen. Orbits: Asymmetric soft tissue stranding seen involving the intraconal fat of the left orbit (series 5, image 118). Left lateral rectus muscle appears enlarged, new from previous. Globes remain symmetric in size with normal morphology and appearance. Review of the MIP images confirms the above findings CTA NECK FINDINGS Aortic arch: Visualized aortic arch normal in caliber with normal 3 vessel morphology. Minimal plaque at the origin of the left subclavian artery. No flow-limiting stenosis about the origin of the great vessels. Subclavian arteries otherwise widely patent. Right carotid system: Right common carotid artery widely patent from its origin to the bifurcation without abnormality or stenosis. No atheromatous narrowing about the right carotid bifurcation. Multiple segments of ectasias again seen involving the mid-distal right ICA measuring up to 9 mm in maximal diameter, similar to previous. Superimposed saccular aneurysm at the distal right ICA just proximal to the skull base relatively stable measuring 8 x 7 x 8 mm (transverse by craniocaudad by AP, series 12, image 111). No dissection or stenosis. Left carotid system: Left common carotid artery patent from its origin to the bifurcation without abnormality. No atheromatous narrowing about the left bifurcation. Aneurysmal dilatation of the distal left ICA up to 12 mm, relatively similar. No dissection or stenosis. Vertebral arteries: Both of the vertebral arteries arise from the subclavian arteries. Left vertebral artery dominant. No evidence for dissection or flow-limiting stenosis. Focal ectasia up at the level of C2 measuring up to 6 mm on the left and 5 mm on the right, stable.  Skeleton: Mild multilevel cervical spondylolysis without significant spinal stenosis, stable. Other neck: No other acute soft tissue abnormality within the neck. Few subcentimeter hypodense thyroid nodules noted, of doubtful significance. Upper chest: Visualized upper chest demonstrates no acute finding. Review of the MIP images confirms the above findings CTA HEAD FINDINGS Anterior circulation: Petrous segments patent bilaterally. Cavernous/supraclinoid ICAs patent without flow-limiting stenosis or definite abnormality. Adjacent venous contamination within the cavernous sinus limits evaluation. There is asymmetric engorgement and filling of the left superior orbital vein, raising the possibility for CC fistula (series 12, image 71). Additionally, there is asymmetric filling of the left inferior petrosal sinus. A1 segments patent bilaterally. Normal anterior communicating artery. Anterior cerebral arteries well perfused to their distal aspects. M1 segments patent bilaterally. Distal MCA branches well perfused and symmetric. Posterior circulation: Left vertebral artery ectatic measuring up to 5 mm, similar to previous. Right vertebral artery diffusely hypoplastic. Posterior inferior cerebral arteries patent proximally. Basilar patent to its distal aspect. Superior cerebral arteries perfuse bilaterally. Both of the PCAs patent to their distal aspects without stenosis. Venous sinuses: Concern for left-sided CC fistula or possibly dural AVF as above. Venous sinuses otherwise grossly patent allowing for timing of the contrast bolus. Anatomic variants: Tiny right posterior communicating artery noted. No other significant anatomic variant. No appreciable aneurysm. Delayed phase: No abnormal enhancement. Review of the MIP images confirms  the above findings IMPRESSION: 1. Abnormal fat stranding involving the intraconal fat of the left orbit with associated extraocular muscle enlargement and engorgement of the left superior  orbital vein. Findings concerning for possible cavernous carotid fistula or other arteriovenous shunt about the cavernous sinus. Further assessment with dedicated catheter directed arteriogram recommended for further evaluation. 2. Otherwise stable CTA with multiple segments of ectasia involving the carotid and vertebral arteries bilaterally. 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 Again, findings may reflect fibromuscular dysplasia or underlying connective tissue disorder. 3. No large vessel occlusion, arterial dissection, or hemodynamically significant stenosis identified. 4. No other acute intracranial abnormality. Electronically Signed   By: Jeannine Boga M.D.   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 Stage II of embolization. EXAM: TRANSCATHETER THERAPY EMBOLIZATION; RADIOLOGY EXAMINATION; IR ANGIO INTRA EXTRACRAN SEL INTERNAL CAROTID UNI LEFT MOD SED; ARTERIOGRAPHY; IR ANGIO INTRA EXTRACRAN SEL COM CAROTID INNOMINATE UNI RIGHT MOD SED COMPARISON:  Arteriograms of 10/23/2018 and CT angiogram of 10/26/2018. MEDICATIONS: Heparin 2000 units IV; No antibiotic was administered within 1 hour of the procedure. ANESTHESIA/SEDATION: General anesthesia. CONTRAST:  Isovue 300 approximately 120 mL. FLUOROSCOPY TIME:  Fluoroscopy Time: 114 minutes 6 seconds (2911 mGy). COMPLICATIONS: None immediate. TECHNIQUE: Informed written consent was obtained from the patient after a thorough discussion of the procedural risks, benefits and alternatives. All questions were addressed. Maximal Sterile Barrier Technique was utilized including caps, mask, sterile gowns, sterile gloves, sterile drape, hand hygiene and skin antiseptic. A timeout was performed prior to the initiation of the procedure. The  left groin was prepped and draped in the usual sterile fashion. Thereafter using modified Seldinger technique, transfemoral access into the left common femoral artery was obtained without difficulty. Over a 0.035 inch guidewire, a 5 French Pinnacle sheath was inserted. Through this, and also over 0.035 inch guidewire, a 5 [place] JB 1 catheter was advanced to the aortic arch region and selectively positioned in the right common carotid artery, and the left common carotid artery. FINDINGS: The right common carotid arteriogram again demonstrates the right external carotid artery and its major branches to be widely patent. The right internal carotid artery at the bulb and its proximal 1/3 appears again normal. 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 Distal to this the petrous, the cavernous and the supraclinoid segments are widely patent. The right middle cerebral artery and the right anterior cerebral artery opacify into the capillary and venous phases. There is prompt cross filling via the anterior communicating artery of the left middle cerebral artery and the left anterior cerebral artery into the capillary and venous phases. Antegrade flow of blood is seen in the supraclinoid left ICA from the left internal carotid artery. The left common carotid arteriogram demonstrates the left external carotid artery and its major branches to be widely patent. 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 Demonstrated is the large fast flow fistula of the left internal carotid artery caval cavernous region with partial opacification of the  left internal carotid artery distal to the  origin of the ophthalmic artery. There is partial flow noted into the left middle cerebral artery itself. Prominent fluid again is demonstrated in the left cavernous sinus with cross-filling the inner cavernous branches of the right cavernous sinus. Subsequent flow is noted into the right inferior petrosal sinus, the right petroclival veins more prominent on the left than on the right side. Prominent opacification is noted of the left superior ophthalmic vein, the left inferior ophthalmic vein, and also the left pterygoid plexus. 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 The diagnostic JB 1 catheter in the left common carotid artery was then exchanged over a 0.035 inch 300 cm Rosen exchange guidewire for an 8 [place] 85 cm Neuron Max sheath. The guidewire was removed. Good aspiration was obtained from the hub of the Neuron Max sheath. A gentle control arteriogram performed through the sheath demonstrated no change in the extracranial or intracranial circulation. Over a 0.035 Roadrunner guidewire using biplane roadmap technique, a 6 French 126 cm Navien guide catheter was then advanced without difficulty to the horizontal petrous segment of the left internal carotid artery. The guidewire was removed. Good aspiration was obtained from the hub of the Navien guide catheter. A gentle control arteriogram demonstrated safe position the tip of the microcatheter. The Neuron Max sheath was then advanced to the junction of the middle and distal one thirds of the left internal carotid artery. Over a 0.014 inch Softip Synchro micro guidewire, an 027 Phenom microcatheter was then advanced to the proximal cavernous segment in the left internal carotid artery. Thereafter, the wire was manipulated with the torque device with advancement of the microcatheter and also the advancement of the Navien guide catheter. However, no easy access was obtained into the  previously positioned Neuroform Atlas stent. It could also not be obtained into the distal cavernous and supraclinoid left ICA. This was then replaced with an 01 Echelon microcatheter which was advanced again over a 0.014 inch Softip Synchro micro guidewire into first the inferior then the superior ophthalmic veins. After having confirmed safe position of the tip of the microcatheter dangerous anastomosis, embolization was performed with coils these two abnormally prominent draining veins from a distal to proximal position. A final control arteriogram performed following the embolization of these two veins demonstrated near complete occlusion of these two veins. The microcatheter was then directed superiorly and posteriorly to the superior petrosal sinus. Again after having confirmed safe position of tip of the microcatheter with a gentle control arteriogram, embolization of this was performed with additional coils being positioned in addition to the previously positioned coils. Control arteriogram at this time demonstrated slightly improved opacification of the internal carotid artery into the inner cavernous branch of the cavernous sinus. Advancement of coils in this region was again met with significant resistance with herniation of coil back into the cavernous sinus. With now slightly improved visualization of the internal carotid artery distally and in the cavernous sinus attempts were now remained to access the previously positioned Atlas stent into the distal left internal carotid artery and the proximal middle cerebral artery. This was obtained using a 014 inch Chikai micro guidewire. Access over this wire was obtained with the Echelon microcatheter. This was then exchanged for a 014 inch 200 cm Transend exchange micro guidewire without difficulty. A Phenom 027 microcatheter was then advanced over the exchange micro guidewire. However, the microcatheter continued to be met with significant  resistance in the  caval cavernous segment at the origin of the superior ophthalmic vein. This was despite maneuvering of the Neuroform guide sheath into the petrous cavernous junction. This approach was then abandoned. Control arteriogram performed through the 8 French Neuron Max sheath in the petrous cavernous junction continued to demonstrate perfuse shunting into the left pterygoid venous plexus, and also, into the contralateral cavernous sinus via the inner cavernous communications. Slow flow to near obliteration was noted of the superior and the ophthalmic veins. A final control arteriogram performed through the Neuron Max sheath in the left internal carotid artery petrous cavernous junction demonstrated flash filling of the distal cavernous internal carotid artery. The following coils were deployed for treatment of the left-sided carotid cavernous fistula: A 5 mm x 15 cm Axium Prime coil, a 5 mm x 15 cm helix Axium Prime coil, a 5 mm x 15 cm Axium helix coil, a 5 mm x 15 cm helix Axium Prime coil, a 5 mm x 20 cm Axium Prime coil, a 6 mm x 20 cm Axium Prime helix coil, and finally a 5 mm x 20 cm helix Axium Prime coil. The Neuron Max sheath was then retrieved into the abdominal aorta and exchanged for an 8 [place] Pinnacle sheath. The sheath was then removed with hemostasis achieved in the left common femoral puncture site with manual compression. The patient's groin site appeared soft without evidence of hematoma or bleeding. Distal pulses remained palpable in the dorsalis pedis, and the posterior tibial regions bilaterally. Patient's ACT of around 240 seconds was then reversed partially with 7.5 mg of protamine sulfate IV. The patient's general anesthesia was then reversed and the patient was then extubated. Upon recovery, the patient complained of no nausea, vomiting or headaches. She was able to move all her extremities equally. The left ptosis remained stable as did the decreased movement of the left eyeball. The left pupil  remained at 5 mm and sluggish reactive. The patient was able to proceed to the left eye unchanged. She was then transferred to neuro PACU and then neuro ICU. Overnight the patient remained stable with improved sensorium. Neurologically the following day the patient demonstrated no change in the left sided ptosis, pupils on the left side minimally reactive, and also decreased movements of left eyeball. The left groin appeared soft without evidence of hematoma. Distal pulses remained stable and palpable. 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 PLAN: Patient will be transferred to the admitting team. The patient will be seen in follow-up in the clinic in 2 weeks time for possible MRI MRA prior to the visit. Patient was informed of the possible need for a diagnostic arteriogram and possible stage III embolization. The patient will be left on aspirin 81 mg a day and maintain adequate hydration. Patient was also advised to refrain from stooping, bending or lifting weights above 10 pounds for 2 weeks. She was also advised to maintain adequate hydration, and also not to drive. She was advised that should she develop new symptoms such as eye pain, nausea, vomiting, severe headaches to call 911 and come to the emergency room. She expressed understanding and agreement with the above management plan. Electronically Signed   By: Luanne Bras M.D.   On: 11/01/2018 10:28   Ir Transcath/emboliz  Result Date: 10/25/2018 CLINICAL DATA:  Left-sided pulsatile tinnitus. Left-sided orbital chemosis, proptosis and ptosis. Abnormal CT angiogram of the head and neck. EXAM: IR ANGIO  INTRA EXTRACRAN SEL INTERNAL CAROTID UNI LEFT MOD SED; ARTERIOGRAPHY; BILATERAL COMMON CAROTID AND INNOMINATE ANGIOGRAPHY; TRANSCATHETER THERAPY EMBOLIZATION COMPARISON:  CT angiogram of the head and neck of 10/20/2018. MEDICATIONS: Heparin 1000 units IV; no antibiotic  was administered within 1 hour of the procedure. ANESTHESIA/SEDATION: Versed 1 mg IV; Fentanyl 25 mcg IV. Moderate Sedation Time:  30 minutes. The patient was continuously monitored during the procedure by the interventional radiology nurse under my direct supervision. CONTRAST:  Isovue 300 approximately 60 mL. FLUOROSCOPY TIME:  Fluoroscopy Time: 12 minutes 6 seconds (747 mGy). COMPLICATIONS: None immediate. TECHNIQUE: Informed written consent was obtained from the patient after a thorough discussion of the procedural risks, benefits and alternatives. All questions were addressed. Maximal Sterile Barrier Technique was utilized including caps, mask, sterile gowns, sterile gloves, sterile drape, hand hygiene and skin antiseptic. A timeout was performed prior to the initiation of the procedure. The right groin was prepped and draped in the usual sterile fashion. Thereafter using modified Seldinger technique, transfemoral access into the right common femoral artery was obtained without difficulty. Over a 0.035 inch guidewire, a 5 French Pinnacle sheath was inserted. Through this, and also over 0.035 inch guidewire, a 5 [place] JB 1 catheter was advanced to the aortic arch region and selectively positioned in the right common carotid artery, the right subclavian artery, the left common carotid artery and the left vertebral artery. FINDINGS: Right subclavian arteriogram demonstrates a hypoplastic right vertebral artery. The vertebral artery origin is widely patent. The vessel is seen to opacify to the cranial skull base where it is seen to opacify the right posterior-inferior cerebellar artery and the right vertebrobasilar junction. Flow is also demonstrated into the distal basilar artery with mixing of unopacified blood from the more dominant contralateral left vertebral artery. The right common carotid arteriogram demonstrates the right external carotid artery and its major branches to be widely patent. The right  internal carotid artery at the bulb is widely patent. No patency is seen of the proximal 1/3 of the right internal carotid artery. Distal to this there is a fusiform smooth caliber irregularity extending to the cervical petrous junction. At the level of the distal right internal carotid artery cervical segment there is an approximately 12.3 mm x 8.2 mm aneurysm projecting inferiorly and medially. Distal to this the right internal carotid artery demonstrates wide patency. The petrous, the cavernous and the supraclinoid segments are widely patent. The right middle cerebral artery and the right anterior cerebral artery opacify into the capillary and venous phases. There is a simultaneous cross-filling via the anterior communicating artery of the left anterior cerebral artery and the left middle cerebral artery into the capillary and venous phases. Unopacified blood is seen in the region of the left internal carotid terminus and proximal left middle cerebral artery. A left common carotid arteriogram demonstrates abnormally prominent left common carotid artery. The left external carotid artery and its major branches are widely patent. The left internal carotid artery at the bulb to the level of the junction of the middle 1/3 of the left internal carotid artery is widely patent. Arising at the junction of the distal 1/3 and the middle 1/3 of the left internal carotid artery is a 16.2 mm x 11.5 mm saccular aneurysm projecting posteriorly. Distal to this the left internal carotid artery assumes a normal caliber to the petrous segment. At the level of the caval cavernous segment of the left internal carotid artery saccular pouch is noted with immediate egress of contrast posteriorly and  also medially inferiorly and also crossing the midline across the intra cavernous tributaries opacifying the contralateral right cavernous sinus and subsequently the inferior petrosal sinus on the right side. Also demonstrated is the  abnormally prominent superior petrosal sinus draining into the junction of the transverse and the left sigmoid sinus. There is spontaneous fast flow egress also inferiorly and laterally into the pterygoid plexus, and also the left inferior petrosal sinus. Also noted is abnormal prominence of the superior ophthalmic vein, and the inferior ophthalmic vein projecting anteriorly and laterally and superiorly. Distal to the caval cavernous segment, the distal cavernous and the supraclinoid segments are widely patent. However, there is significant diminution of blood supply to the left middle cerebral artery. No visible opacification of the left anterior cerebral artery is noted from the left internal carotid artery injection. Origin of the dominant left vertebral artery is widely patent. The vessel is seen to opacify to the cranial skull base. At the level of the first horizontal segment there is a 6.1 mm x 4.2 mm saccular aneurysm. Distal to this the left vertebrobasilar junction and the left posterior-inferior cerebellar artery demonstrate wide patency. The basilar artery, the posterior cerebral arteries, the superior cerebellar arteries and the anterior-inferior cerebellar arteries opacify into the capillary and venous phases. No retrograde opacification of the posterior communicating artery is seen from the left vertebral artery injection. 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 Extensive shunting of blood from the left internal carotid artery intracranially by the large fistulous communications. Cross-filling via the anterior communicating artery of the left middle cerebral  artery and the left anterior cerebral artery from the right-sided internal carotid artery injection. 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 Approximately 12.3 mm x 8.2 mm saccular aneurysm arising in the posterior wall of the right internal carotid artery and distal cervical segment. PLAN: Findings reviewed with the patient and her sister. 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 Electronically Signed   By: Luanne Bras M.D.   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 Stage II of embolization. EXAM: TRANSCATHETER THERAPY EMBOLIZATION; RADIOLOGY EXAMINATION; IR ANGIO INTRA EXTRACRAN SEL INTERNAL CAROTID UNI LEFT MOD SED; ARTERIOGRAPHY; IR ANGIO INTRA EXTRACRAN SEL COM CAROTID INNOMINATE UNI RIGHT MOD SED COMPARISON:  Arteriograms of 10/23/2018 and CT angiogram of 10/26/2018. MEDICATIONS: Heparin 2000 units IV; No antibiotic was administered within 1 hour of the procedure. ANESTHESIA/SEDATION: General anesthesia. CONTRAST:  Isovue 300 approximately 120 mL. FLUOROSCOPY TIME:  Fluoroscopy Time: 114 minutes 6 seconds (2911 mGy). COMPLICATIONS: None immediate. TECHNIQUE: Informed written consent was obtained from the patient after a thorough discussion of the procedural risks, benefits and alternatives. All questions were addressed. Maximal Sterile Barrier Technique was utilized including caps, mask, sterile gowns, sterile gloves, sterile drape, hand hygiene and skin antiseptic. A timeout was performed prior to the initiation of the procedure. The left groin was prepped  and draped in the usual sterile fashion. Thereafter using modified  Seldinger technique, transfemoral access into the left common femoral artery was obtained without difficulty. Over a 0.035 inch guidewire, a 5 French Pinnacle sheath was inserted. Through this, and also over 0.035 inch guidewire, a 5 [place] JB 1 catheter was advanced to the aortic arch region and selectively positioned in the right common carotid artery, and the left common carotid artery. FINDINGS: The right common carotid arteriogram again demonstrates the right external carotid artery and its major branches to be widely patent. The right internal carotid artery at the bulb and its proximal 1/3 appears again normal. 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 Distal to this the petrous, the cavernous and the supraclinoid segments are widely patent. The right middle cerebral artery and the right anterior cerebral artery opacify into the capillary and venous phases. There is prompt cross filling via the anterior communicating artery of the left middle cerebral artery and the left anterior cerebral artery into the capillary and venous phases. Antegrade flow of blood is seen in the supraclinoid left ICA from the left internal carotid artery. The left common carotid arteriogram demonstrates the left external carotid artery and its major branches to be widely patent. 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 Demonstrated is the large fast flow fistula of the left internal carotid artery caval cavernous region with partial opacification of the left internal carotid artery distal to the origin of the ophthalmic artery. There is partial flow noted into the left middle cerebral  artery itself. Prominent fluid again is demonstrated in the left cavernous sinus with cross-filling the inner cavernous branches of the right cavernous sinus. Subsequent flow is noted into the right inferior petrosal sinus, the right petroclival veins more prominent on the left than on the right side. Prominent opacification is noted of the left superior ophthalmic vein, the left inferior ophthalmic vein, and also the left pterygoid plexus. 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 The diagnostic JB 1 catheter in the left common carotid artery was then exchanged over a 0.035 inch 300 cm Rosen exchange guidewire for an 8 [place] 85 cm Neuron Max sheath. The guidewire was removed. Good aspiration was obtained from the hub of the Neuron Max sheath. A gentle control arteriogram performed through the sheath demonstrated no change in the extracranial or intracranial circulation. Over a 0.035 Roadrunner guidewire using biplane roadmap technique, a 6 French 126 cm Navien guide catheter was then advanced without difficulty to the horizontal petrous segment of the left internal carotid artery. The guidewire was removed. Good aspiration was obtained from the hub of the Navien guide catheter. A gentle control arteriogram demonstrated safe position the tip of the microcatheter. The Neuron Max sheath was then advanced to the junction of the middle and distal one thirds of the left internal carotid artery. Over a 0.014 inch Softip Synchro micro guidewire, an 027 Phenom microcatheter was then advanced to the proximal cavernous segment in the left internal carotid artery. Thereafter, the wire was manipulated with the torque device with advancement of the microcatheter and also the advancement of the  Navien guide catheter. However, no easy access was obtained into the previously positioned Neuroform Atlas stent. It could also not be obtained into the distal  cavernous and supraclinoid left ICA. This was then replaced with an 01 Echelon microcatheter which was advanced again over a 0.014 inch Softip Synchro micro guidewire into first the inferior then the superior ophthalmic veins. After having confirmed safe position of the tip of the microcatheter dangerous anastomosis, embolization was performed with coils these two abnormally prominent draining veins from a distal to proximal position. A final control arteriogram performed following the embolization of these two veins demonstrated near complete occlusion of these two veins. The microcatheter was then directed superiorly and posteriorly to the superior petrosal sinus. Again after having confirmed safe position of tip of the microcatheter with a gentle control arteriogram, embolization of this was performed with additional coils being positioned in addition to the previously positioned coils. Control arteriogram at this time demonstrated slightly improved opacification of the internal carotid artery into the inner cavernous branch of the cavernous sinus. Advancement of coils in this region was again met with significant resistance with herniation of coil back into the cavernous sinus. With now slightly improved visualization of the internal carotid artery distally and in the cavernous sinus attempts were now remained to access the previously positioned Atlas stent into the distal left internal carotid artery and the proximal middle cerebral artery. This was obtained using a 014 inch Chikai micro guidewire. Access over this wire was obtained with the Echelon microcatheter. This was then exchanged for a 014 inch 200 cm Transend exchange micro guidewire without difficulty. A Phenom 027 microcatheter was then advanced over the exchange micro guidewire. However, the microcatheter continued to be met with significant resistance in the caval cavernous segment at the origin of the superior ophthalmic vein. This was despite  maneuvering of the Neuroform guide sheath into the petrous cavernous junction. This approach was then abandoned. Control arteriogram performed through the 8 French Neuron Max sheath in the petrous cavernous junction continued to demonstrate perfuse shunting into the left pterygoid venous plexus, and also, into the contralateral cavernous sinus via the inner cavernous communications. Slow flow to near obliteration was noted of the superior and the ophthalmic veins. A final control arteriogram performed through the Neuron Max sheath in the left internal carotid artery petrous cavernous junction demonstrated flash filling of the distal cavernous internal carotid artery. The following coils were deployed for treatment of the left-sided carotid cavernous fistula: A 5 mm x 15 cm Axium Prime coil, a 5 mm x 15 cm helix Axium Prime coil, a 5 mm x 15 cm Axium helix coil, a 5 mm x 15 cm helix Axium Prime coil, a 5 mm x 20 cm Axium Prime coil, a 6 mm x 20 cm Axium Prime helix coil, and finally a 5 mm x 20 cm helix Axium Prime coil. The Neuron Max sheath was then retrieved into the abdominal aorta and exchanged for an 8 [place] Pinnacle sheath. The sheath was then removed with hemostasis achieved in the left common femoral puncture site with manual compression. The patient's groin site appeared soft without evidence of hematoma or bleeding. Distal pulses remained palpable in the dorsalis pedis, and the posterior tibial regions bilaterally. Patient's ACT of around 240 seconds was then reversed partially with 7.5 mg of protamine sulfate IV. The patient's general anesthesia was then reversed and the patient was then extubated. Upon recovery, the patient complained of no nausea, vomiting or headaches. She  was able to move all her extremities equally. The left ptosis remained stable as did the decreased movement of the left eyeball. The left pupil remained at 5 mm and sluggish reactive. The patient was able to proceed to the left eye  unchanged. She was then transferred to neuro PACU and then neuro ICU. Overnight the patient remained stable with improved sensorium. Neurologically the following day the patient demonstrated no change in the left sided ptosis, pupils on the left side minimally reactive, and also decreased movements of left eyeball. The left groin appeared soft without evidence of hematoma. Distal pulses remained stable and palpable. 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 PLAN: Patient will be transferred to the admitting team. The patient will be seen in follow-up in the clinic in 2 weeks time for possible MRI MRA prior to the visit. Patient was informed of the possible need for a diagnostic arteriogram and possible stage III embolization. The patient will be left on aspirin 81 mg a day and maintain adequate hydration. Patient was also advised to refrain from stooping, bending or lifting weights above 10 pounds for 2 weeks. She was also advised to maintain adequate hydration, and also not to drive. She was advised that should she develop new symptoms such as eye pain, nausea, vomiting, severe headaches to call 911 and come to the emergency room. She expressed understanding and agreement with the above management plan. Electronically Signed   By: Luanne Bras M.D.   On: 11/01/2018 10:28   Ir Angiogram Follow Up Study  Result Date: 10/25/2018 CLINICAL DATA:  Left-sided pulsatile tinnitus. Left-sided orbital chemosis, proptosis and ptosis. Abnormal CT angiogram of the head and neck. EXAM: IR ANGIO INTRA EXTRACRAN SEL INTERNAL CAROTID UNI LEFT MOD SED; ARTERIOGRAPHY; BILATERAL COMMON CAROTID AND INNOMINATE ANGIOGRAPHY; TRANSCATHETER THERAPY EMBOLIZATION COMPARISON:  CT angiogram of the head and neck of 10/20/2018. MEDICATIONS: Heparin 1000 units IV; no antibiotic was administered within 1 hour of the procedure. ANESTHESIA/SEDATION: Versed 1 mg  IV; Fentanyl 25 mcg IV. Moderate Sedation Time:  30 minutes. The patient was continuously monitored during the procedure by the interventional radiology nurse under my direct supervision. CONTRAST:  Isovue 300 approximately 60 mL. FLUOROSCOPY TIME:  Fluoroscopy Time: 12 minutes 6 seconds (747 mGy). COMPLICATIONS: None immediate. TECHNIQUE: Informed written consent was obtained from the patient after a thorough discussion of the procedural risks, benefits and alternatives. All questions were addressed. Maximal Sterile Barrier Technique was utilized including caps, mask, sterile gowns, sterile gloves, sterile drape, hand hygiene and skin antiseptic. A timeout was performed prior to the initiation of the procedure. The right groin was prepped and draped in the usual sterile fashion. Thereafter using modified Seldinger technique, transfemoral access into the right common femoral artery was obtained without difficulty. Over a 0.035 inch guidewire, a 5 French Pinnacle sheath was inserted. Through this, and also over 0.035 inch guidewire, a 5 [place] JB 1 catheter was advanced to the aortic arch region and selectively positioned in the right common carotid artery, the right subclavian artery, the left common carotid artery and the left vertebral artery. FINDINGS: Right subclavian arteriogram demonstrates a hypoplastic right vertebral artery. The vertebral artery origin is widely patent. The vessel is seen to opacify to the cranial skull base where it is seen to opacify the right posterior-inferior cerebellar artery and the right vertebrobasilar junction. Flow is also demonstrated into the distal basilar artery with mixing of unopacified blood from the  more dominant contralateral left vertebral artery. The right common carotid arteriogram demonstrates the right external carotid artery and its major branches to be widely patent. The right internal carotid artery at the bulb is widely patent. No patency is seen of the proximal  1/3 of the right internal carotid artery. Distal to this there is a fusiform smooth caliber irregularity extending to the cervical petrous junction. At the level of the distal right internal carotid artery cervical segment there is an approximately 12.3 mm x 8.2 mm aneurysm projecting inferiorly and medially. Distal to this the right internal carotid artery demonstrates wide patency. The petrous, the cavernous and the supraclinoid segments are widely patent. The right middle cerebral artery and the right anterior cerebral artery opacify into the capillary and venous phases. There is a simultaneous cross-filling via the anterior communicating artery of the left anterior cerebral artery and the left middle cerebral artery into the capillary and venous phases. Unopacified blood is seen in the region of the left internal carotid terminus and proximal left middle cerebral artery. A left common carotid arteriogram demonstrates abnormally prominent left common carotid artery. The left external carotid artery and its major branches are widely patent. The left internal carotid artery at the bulb to the level of the junction of the middle 1/3 of the left internal carotid artery is widely patent. Arising at the junction of the distal 1/3 and the middle 1/3 of the left internal carotid artery is a 16.2 mm x 11.5 mm saccular aneurysm projecting posteriorly. Distal to this the left internal carotid artery assumes a normal caliber to the petrous segment. At the level of the caval cavernous segment of the left internal carotid artery saccular pouch is noted with immediate egress of contrast posteriorly and also medially inferiorly and also crossing the midline across the intra cavernous tributaries opacifying the contralateral right cavernous sinus and subsequently the inferior petrosal sinus on the right side. Also demonstrated is the abnormally prominent superior petrosal sinus draining into the junction of the transverse and the  left sigmoid sinus. There is spontaneous fast flow egress also inferiorly and laterally into the pterygoid plexus, and also the left inferior petrosal sinus. Also noted is abnormal prominence of the superior ophthalmic vein, and the inferior ophthalmic vein projecting anteriorly and laterally and superiorly. Distal to the caval cavernous segment, the distal cavernous and the supraclinoid segments are widely patent. However, there is significant diminution of blood supply to the left middle cerebral artery. No visible opacification of the left anterior cerebral artery is noted from the left internal carotid artery injection. Origin of the dominant left vertebral artery is widely patent. The vessel is seen to opacify to the cranial skull base. At the level of the first horizontal segment there is a 6.1 mm x 4.2 mm saccular aneurysm. Distal to this the left vertebrobasilar junction and the left posterior-inferior cerebellar artery demonstrate wide patency. The basilar artery, the posterior cerebral arteries, the superior cerebellar arteries and the anterior-inferior cerebellar arteries opacify into the capillary and venous phases. No retrograde opacification of the posterior communicating artery is seen from the left vertebral artery injection. 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 Extensive shunting of blood from the left  internal carotid artery intracranially by the large fistulous communications. Cross-filling via the anterior communicating artery of the left middle cerebral artery and the left anterior cerebral artery from the right-sided internal carotid artery  injection. 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 Approximately 12.3 mm x 8.2 mm saccular aneurysm arising in the posterior wall of the right internal carotid artery and distal cervical segment. PLAN: Findings reviewed with the patient and her sister. 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 Electronically Signed   By: Luanne Bras M.D.   On: 10/24/2018 15:04   Dg Chest Port 1 View  Result Date: 10/26/2018 CLINICAL DATA:  Elevated white blood cell count. EXAM: PORTABLE CHEST 1 VIEW COMPARISON:  Single-view of the chest 10/20/2018. PA and lateral chest and CT chest 07/11/2018. FINDINGS: The patient has very small bilateral pleural effusions. Lungs are clear. Heart size is normal. No pneumothorax. No acute bony abnormality. IMPRESSION: Small bilateral pleural effusions.  Lungs are clear. Electronically Signed   By: Inge Rise M.D.   On: 10/26/2018 14:25   Dg Chest Port 1 View  Result Date: 10/20/2018 CLINICAL DATA:  Hypercalcemia. EXAM: PORTABLE CHEST 1 VIEW COMPARISON:  CT chest and chest x-ray dated July 11, 2018. FINDINGS: The heart size and mediastinal contours are within normal limits. Normal pulmonary vascularity. Chronic pleural thickening at the bilateral costophrenic angles is unchanged. No focal consolidation, pleural effusion, or pneumothorax. No acute osseous abnormality. IMPRESSION: No active disease. Electronically Signed   By: Titus Dubin M.D.   On: 10/20/2018 05:55   Dg Abd 2 Views  Result Date: 10/26/2018 CLINICAL DATA:  Nausea and vomiting EXAM: ABDOMEN - 2 VIEW COMPARISON:  None. FINDINGS: Ovoid density in the pelvis attributed to full urinary bladder. The kidneys also are still excreting contrast. Normal bowel gas pattern. No pneumoperitoneum. IMPRESSION: 1.  Distended urinary bladder. 2. Normal bowel gas pattern. Electronically Signed   By: Monte Fantasia M.D.   On: 10/26/2018 10:12   Vas [place] Upper Extremity Venous Duplex  Result Date: 10/28/2018 UPPER VENOUS STUDY  Indications: bruising Performing Technologist: June Leap RDMS, RVT  Examination Guidelines: A complete evaluation includes B-mode imaging, spectral Doppler, color Doppler, and power Doppler as needed of all accessible portions of each vessel. Bilateral testing is considered an integral part of a complete examination. Limited examinations for reoccurring indications may be performed as noted.  Right Findings: +----------+------------+---------+-----------+----------+-------+  RIGHT      Compressible Phasicity Spontaneous Properties Summary  +----------+------------+---------+-----------+----------+-------+  Subclavian                 Yes        Yes                         +----------+------------+---------+-----------+----------+-------+  Left Findings: +----------+------------+---------+-----------+----------+-------+  LEFT       Compressible Phasicity Spontaneous Properties Summary  +----------+------------+---------+-----------+----------+-------+  IJV            Full        Yes        Yes                         +----------+------------+---------+-----------+----------+-------+  Subclavian     Full        Yes  Yes                         +----------+------------+---------+-----------+----------+-------+  Axillary       Full        Yes        Yes                         +----------+------------+---------+-----------+----------+-------+  Brachial       Full        Yes        Yes                         +----------+------------+---------+-----------+----------+-------+  Radial         Full                                               +----------+------------+---------+-----------+----------+-------+  Ulnar          Full                                                +----------+------------+---------+-----------+----------+-------+  Cephalic       Full                                               +----------+------------+---------+-----------+----------+-------+  Basilic        Full                                               +----------+------------+---------+-----------+----------+-------+  Summary:  Right: No evidence of thrombosis in the subclavian.  Left: No evidence of deep vein thrombosis in the upper extremity. No evidence of superficial vein thrombosis in the upper extremity.  *See table(s) above for measurements and observations.  Diagnosing physician: Harold Barban MD Electronically signed by Harold Barban MD on 10/28/2018 at 2:19:20 PM.    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 Stage II of embolization. EXAM: TRANSCATHETER THERAPY EMBOLIZATION; RADIOLOGY EXAMINATION; IR ANGIO INTRA EXTRACRAN SEL INTERNAL CAROTID UNI LEFT MOD SED; ARTERIOGRAPHY; IR ANGIO INTRA EXTRACRAN SEL COM CAROTID INNOMINATE UNI RIGHT MOD SED COMPARISON:  Arteriograms of 10/23/2018 and CT angiogram of 10/26/2018. MEDICATIONS: Heparin 2000 units IV; No antibiotic was administered within 1 hour of the procedure. ANESTHESIA/SEDATION: General anesthesia. CONTRAST:  Isovue 300 approximately 120 mL. FLUOROSCOPY TIME:  Fluoroscopy Time: 114 minutes 6 seconds (2911 mGy). COMPLICATIONS: None immediate. TECHNIQUE: Informed written consent was obtained from the patient after a thorough discussion of the procedural risks, benefits and alternatives. All questions were addressed. Maximal Sterile Barrier Technique was utilized including caps, mask, sterile gowns, sterile gloves, sterile drape, hand hygiene and skin antiseptic. A timeout was performed prior to the initiation of the procedure. The left groin was prepped  and draped in the usual sterile fashion. Thereafter using modified Seldinger technique, transfemoral access into the left common femoral artery was obtained without difficulty. Over a 0.035 inch guidewire, a 5 French Pinnacle sheath was inserted. Through this, and also over 0.035 inch guidewire, a 5 [place] JB 1 catheter was advanced to the aortic arch region and selectively positioned in the right common carotid artery, and the left common carotid artery. FINDINGS: The right common carotid arteriogram again demonstrates the right external carotid artery and its major branches to be widely patent. The right internal carotid artery at the bulb and its proximal 1/3 appears again normal. 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 Distal to this the petrous, the cavernous and the supraclinoid segments are widely patent. The right middle cerebral artery and the right anterior cerebral artery opacify into the capillary and venous phases. There is prompt cross filling via the anterior communicating artery of the left middle cerebral artery and the left anterior cerebral artery into the capillary and venous phases. Antegrade flow of blood is seen in the supraclinoid left ICA from the left internal carotid artery. The left common carotid arteriogram demonstrates the left external carotid artery and its major branches to be widely patent. 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 Demonstrated is the large fast flow fistula of the left internal carotid artery caval cavernous region with partial opacification of the left internal carotid artery distal to the origin of the ophthalmic  artery. There is partial flow noted into the left middle cerebral artery itself. Prominent fluid again is demonstrated in the left cavernous sinus with cross-filling the inner cavernous branches of the right cavernous sinus. Subsequent flow is noted into the right inferior petrosal sinus, the right petroclival veins more prominent on the left than on the right side. Prominent opacification is noted of the left superior ophthalmic vein, the left inferior ophthalmic vein, and also the left pterygoid plexus. 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 The diagnostic JB 1 catheter in the left common carotid artery was then exchanged over a 0.035 inch 300 cm Rosen exchange guidewire for an 8 [place] 85 cm Neuron Max sheath. The guidewire was removed. Good aspiration was obtained from the hub of the Neuron Max sheath. A gentle control arteriogram performed through the sheath demonstrated no change in the extracranial or intracranial circulation. Over a 0.035 Roadrunner guidewire using biplane roadmap technique, a 6 French 126 cm Navien guide catheter was then advanced without difficulty to the horizontal petrous segment of the left internal carotid artery. The guidewire was removed. Good aspiration was obtained from the hub of the Navien guide catheter. A gentle control arteriogram demonstrated safe position the tip of the microcatheter. The Neuron Max sheath was then advanced to the junction of the middle and distal one thirds of the left internal carotid artery. Over a 0.014 inch Softip Synchro micro guidewire, an 027 Phenom microcatheter was then advanced to the proximal cavernous segment in the left internal carotid artery. Thereafter, the wire was manipulated with the torque device with advancement of the microcatheter and also the advancement of the Navien  guide catheter. However, no easy access was obtained into the previously positioned Neuroform  Atlas stent. It could also not be obtained into the distal cavernous and supraclinoid left ICA. This was then replaced with an 01 Echelon microcatheter which was advanced again over a 0.014 inch Softip Synchro micro guidewire into first the inferior then the superior ophthalmic veins. After having confirmed safe position of the tip of the microcatheter dangerous anastomosis, embolization was performed with coils these two abnormally prominent draining veins from a distal to proximal position. A final control arteriogram performed following the embolization of these two veins demonstrated near complete occlusion of these two veins. The microcatheter was then directed superiorly and posteriorly to the superior petrosal sinus. Again after having confirmed safe position of tip of the microcatheter with a gentle control arteriogram, embolization of this was performed with additional coils being positioned in addition to the previously positioned coils. Control arteriogram at this time demonstrated slightly improved opacification of the internal carotid artery into the inner cavernous branch of the cavernous sinus. Advancement of coils in this region was again met with significant resistance with herniation of coil back into the cavernous sinus. With now slightly improved visualization of the internal carotid artery distally and in the cavernous sinus attempts were now remained to access the previously positioned Atlas stent into the distal left internal carotid artery and the proximal middle cerebral artery. This was obtained using a 014 inch Chikai micro guidewire. Access over this wire was obtained with the Echelon microcatheter. This was then exchanged for a 014 inch 200 cm Transend exchange micro guidewire without difficulty. A Phenom 027 microcatheter was then advanced over the exchange micro guidewire. However, the microcatheter continued to be met with significant resistance in the caval cavernous segment at the  origin of the superior ophthalmic vein. This was despite maneuvering of the Neuroform guide sheath into the petrous cavernous junction. This approach was then abandoned. Control arteriogram performed through the 8 French Neuron Max sheath in the petrous cavernous junction continued to demonstrate perfuse shunting into the left pterygoid venous plexus, and also, into the contralateral cavernous sinus via the inner cavernous communications. Slow flow to near obliteration was noted of the superior and the ophthalmic veins. A final control arteriogram performed through the Neuron Max sheath in the left internal carotid artery petrous cavernous junction demonstrated flash filling of the distal cavernous internal carotid artery. The following coils were deployed for treatment of the left-sided carotid cavernous fistula: A 5 mm x 15 cm Axium Prime coil, a 5 mm x 15 cm helix Axium Prime coil, a 5 mm x 15 cm Axium helix coil, a 5 mm x 15 cm helix Axium Prime coil, a 5 mm x 20 cm Axium Prime coil, a 6 mm x 20 cm Axium Prime helix coil, and finally a 5 mm x 20 cm helix Axium Prime coil. The Neuron Max sheath was then retrieved into the abdominal aorta and exchanged for an 8 [place] Pinnacle sheath. The sheath was then removed with hemostasis achieved in the left common femoral puncture site with manual compression. The patient's groin site appeared soft without evidence of hematoma or bleeding. Distal pulses remained palpable in the dorsalis pedis, and the posterior tibial regions bilaterally. Patient's ACT of around 240 seconds was then reversed partially with 7.5 mg of protamine sulfate IV. The patient's general anesthesia was then reversed and the patient was then extubated. Upon recovery, the patient complained of no nausea, vomiting or headaches. She was  able to move all her extremities equally. The left ptosis remained stable as did the decreased movement of the left eyeball. The left pupil remained at 5 mm and sluggish  reactive. The patient was able to proceed to the left eye unchanged. She was then transferred to neuro PACU and then neuro ICU. Overnight the patient remained stable with improved sensorium. Neurologically the following day the patient demonstrated no change in the left sided ptosis, pupils on the left side minimally reactive, and also decreased movements of left eyeball. The left groin appeared soft without evidence of hematoma. Distal pulses remained stable and palpable. 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 PLAN: Patient will be transferred to the admitting team. The patient will be seen in follow-up in the clinic in 2 weeks time for possible MRI MRA prior to the visit. Patient was informed of the possible need for a diagnostic arteriogram and possible stage III embolization. The patient will be left on aspirin 81 mg a day and maintain adequate hydration. Patient was also advised to refrain from stooping, bending or lifting weights above 10 pounds for 2 weeks. She was also advised to maintain adequate hydration, and also not to drive. She was advised that should she develop new symptoms such as eye pain, nausea, vomiting, severe headaches to call 911 and come to the emergency room. She expressed understanding and agreement with the above management plan. Electronically Signed   By: Luanne Bras M.D.   On: 11/01/2018 10:28   Ir Angio Intra Extracran Sel Com Carotid Innominate Bilat Mod Sed  Result Date: 10/25/2018 CLINICAL DATA:  Left-sided pulsatile tinnitus. Left-sided orbital chemosis, proptosis and ptosis. Abnormal CT angiogram of the head and neck. EXAM: IR ANGIO INTRA EXTRACRAN SEL INTERNAL CAROTID UNI LEFT MOD SED; ARTERIOGRAPHY; BILATERAL COMMON CAROTID AND INNOMINATE ANGIOGRAPHY; TRANSCATHETER THERAPY EMBOLIZATION COMPARISON:  CT angiogram of the head and neck of 10/20/2018. MEDICATIONS: Heparin 1000 units IV;  no antibiotic was administered within 1 hour of the procedure. ANESTHESIA/SEDATION: Versed 1 mg IV; Fentanyl 25 mcg IV. Moderate Sedation Time:  30 minutes. The patient was continuously monitored during the procedure by the interventional radiology nurse under my direct supervision. CONTRAST:  Isovue 300 approximately 60 mL. FLUOROSCOPY TIME:  Fluoroscopy Time: 12 minutes 6 seconds (747 mGy). COMPLICATIONS: None immediate. TECHNIQUE: Informed written consent was obtained from the patient after a thorough discussion of the procedural risks, benefits and alternatives. All questions were addressed. Maximal Sterile Barrier Technique was utilized including caps, mask, sterile gowns, sterile gloves, sterile drape, hand hygiene and skin antiseptic. A timeout was performed prior to the initiation of the procedure. The right groin was prepped and draped in the usual sterile fashion. Thereafter using modified Seldinger technique, transfemoral access into the right common femoral artery was obtained without difficulty. Over a 0.035 inch guidewire, a 5 French Pinnacle sheath was inserted. Through this, and also over 0.035 inch guidewire, a 5 [place] JB 1 catheter was advanced to the aortic arch region and selectively positioned in the right common carotid artery, the right subclavian artery, the left common carotid artery and the left vertebral artery. FINDINGS: Right subclavian arteriogram demonstrates a hypoplastic right vertebral artery. The vertebral artery origin is widely patent. The vessel is seen to opacify to the cranial skull base where it is seen to opacify the right posterior-inferior cerebellar artery and the right vertebrobasilar junction. Flow is also demonstrated into the distal basilar artery with mixing  of unopacified blood from the more dominant contralateral left vertebral artery. The right common carotid arteriogram demonstrates the right external carotid artery and its major branches to be widely patent.  The right internal carotid artery at the bulb is widely patent. No patency is seen of the proximal 1/3 of the right internal carotid artery. Distal to this there is a fusiform smooth caliber irregularity extending to the cervical petrous junction. At the level of the distal right internal carotid artery cervical segment there is an approximately 12.3 mm x 8.2 mm aneurysm projecting inferiorly and medially. Distal to this the right internal carotid artery demonstrates wide patency. The petrous, the cavernous and the supraclinoid segments are widely patent. The right middle cerebral artery and the right anterior cerebral artery opacify into the capillary and venous phases. There is a simultaneous cross-filling via the anterior communicating artery of the left anterior cerebral artery and the left middle cerebral artery into the capillary and venous phases. Unopacified blood is seen in the region of the left internal carotid terminus and proximal left middle cerebral artery. A left common carotid arteriogram demonstrates abnormally prominent left common carotid artery. The left external carotid artery and its major branches are widely patent. The left internal carotid artery at the bulb to the level of the junction of the middle 1/3 of the left internal carotid artery is widely patent. Arising at the junction of the distal 1/3 and the middle 1/3 of the left internal carotid artery is a 16.2 mm x 11.5 mm saccular aneurysm projecting posteriorly. Distal to this the left internal carotid artery assumes a normal caliber to the petrous segment. At the level of the caval cavernous segment of the left internal carotid artery saccular pouch is noted with immediate egress of contrast posteriorly and also medially inferiorly and also crossing the midline across the intra cavernous tributaries opacifying the contralateral right cavernous sinus and subsequently the inferior petrosal sinus on the right side. Also demonstrated is  the abnormally prominent superior petrosal sinus draining into the junction of the transverse and the left sigmoid sinus. There is spontaneous fast flow egress also inferiorly and laterally into the pterygoid plexus, and also the left inferior petrosal sinus. Also noted is abnormal prominence of the superior ophthalmic vein, and the inferior ophthalmic vein projecting anteriorly and laterally and superiorly. Distal to the caval cavernous segment, the distal cavernous and the supraclinoid segments are widely patent. However, there is significant diminution of blood supply to the left middle cerebral artery. No visible opacification of the left anterior cerebral artery is noted from the left internal carotid artery injection. Origin of the dominant left vertebral artery is widely patent. The vessel is seen to opacify to the cranial skull base. At the level of the first horizontal segment there is a 6.1 mm x 4.2 mm saccular aneurysm. Distal to this the left vertebrobasilar junction and the left posterior-inferior cerebellar artery demonstrate wide patency. The basilar artery, the posterior cerebral arteries, the superior cerebellar arteries and the anterior-inferior cerebellar arteries opacify into the capillary and venous phases. No retrograde opacification of the posterior communicating artery is seen from the left vertebral artery injection. 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 Extensive shunting  of blood from the left internal carotid artery intracranially by the large fistulous communications. Cross-filling via the anterior communicating artery of the left middle  cerebral artery and the left anterior cerebral artery from the right-sided internal carotid artery injection. 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 Approximately 12.3 mm x 8.2 mm saccular aneurysm arising in the posterior wall of the right internal carotid artery and distal cervical segment. PLAN: Findings reviewed with the patient and her sister. 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 Electronically Signed   By: Luanne Bras M.D.   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 Stage II of embolization. EXAM: TRANSCATHETER THERAPY EMBOLIZATION; RADIOLOGY EXAMINATION; IR ANGIO INTRA EXTRACRAN SEL INTERNAL CAROTID UNI LEFT MOD SED; ARTERIOGRAPHY; IR ANGIO INTRA EXTRACRAN SEL COM CAROTID INNOMINATE UNI RIGHT MOD SED COMPARISON:  Arteriograms of 10/23/2018 and CT angiogram of 10/26/2018. MEDICATIONS: Heparin 2000 units IV; No antibiotic was administered within 1 hour of the procedure. ANESTHESIA/SEDATION: General anesthesia. CONTRAST:  Isovue 300 approximately 120 mL. FLUOROSCOPY TIME:  Fluoroscopy Time: 114 minutes 6 seconds (2911 mGy). COMPLICATIONS: None immediate. TECHNIQUE: Informed written consent was obtained from the patient after a thorough discussion of the procedural risks, benefits and alternatives. All questions were addressed. Maximal Sterile Barrier Technique was utilized including caps, mask, sterile gowns, sterile gloves, sterile drape, hand hygiene and skin antiseptic. A timeout was performed prior to the initiation of the procedure. The left groin was prepped and draped in the usual sterile  fashion. Thereafter using modified Seldinger technique, transfemoral access into the left common femoral artery was obtained without difficulty. Over a 0.035 inch guidewire, a 5 French Pinnacle sheath was inserted. Through this, and also over 0.035 inch guidewire, a 5 [place] JB 1 catheter was advanced to the aortic arch region and selectively positioned in the right common carotid artery, and the left common carotid artery. FINDINGS: The right common carotid arteriogram again demonstrates the right external carotid artery and its major branches to be widely patent. The right internal carotid artery at the bulb and its proximal 1/3 appears again normal. 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 Distal to this the petrous, the cavernous and the supraclinoid segments are widely patent. The right middle cerebral artery and the right anterior cerebral artery opacify into the capillary and venous phases. There is prompt cross filling via the anterior communicating artery of the left middle cerebral artery and the left anterior cerebral artery into the capillary and venous phases. Antegrade flow of blood is seen in the supraclinoid left ICA from the left internal carotid artery. The left common carotid arteriogram demonstrates the left external carotid artery and its major branches to be widely patent. 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 Demonstrated is the large fast flow fistula of the left internal  carotid artery caval cavernous region with partial opacification of the left internal carotid artery distal to the origin of the ophthalmic artery. There is partial flow noted  into the left middle cerebral artery itself. Prominent fluid again is demonstrated in the left cavernous sinus with cross-filling the inner cavernous branches of the right cavernous sinus. Subsequent flow is noted into the right inferior petrosal sinus, the right petroclival veins more prominent on the left than on the right side. Prominent opacification is noted of the left superior ophthalmic vein, the left inferior ophthalmic vein, and also the left pterygoid plexus. 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 The diagnostic JB 1 catheter in the left common carotid artery was then exchanged over a 0.035 inch 300 cm Rosen exchange guidewire for an 8 [place] 85 cm Neuron Max sheath. The guidewire was removed. Good aspiration was obtained from the hub of the Neuron Max sheath. A gentle control arteriogram performed through the sheath demonstrated no change in the extracranial or intracranial circulation. Over a 0.035 Roadrunner guidewire using biplane roadmap technique, a 6 [place] 126 cm Navien guide catheter was then advanced without difficulty to the horizontal petrous segment of the left internal carotid artery. The guidewire was removed. Good aspiration was obtained from the hub of the Navien guide catheter. A gentle control arteriogram demonstrated safe position the tip of the microcatheter. The Neuron Max sheath was then advanced to the junction of the middle and distal one thirds of the left internal carotid artery. Over a 0.014 inch Softip Synchro micro guidewire, an 027 Phenom microcatheter was then advanced to the proximal cavernous segment in the left internal carotid artery. Thereafter, the wire was manipulated with the torque device with advancement of the microcatheter and also the advancement of the Navien guide catheter. However, no easy access was obtained into the previously positioned Neuroform Atlas stent. It could also not be  obtained into the distal cavernous and supraclinoid left ICA. This was then replaced with an 01 Echelon microcatheter which was advanced again over a 0.014 inch Softip Synchro micro guidewire into first the inferior then the superior ophthalmic veins. After having confirmed safe position of the tip of the microcatheter dangerous anastomosis, embolization was performed with coils these two abnormally prominent draining veins from a distal to proximal position. A final control arteriogram performed following the embolization of these two veins demonstrated near complete occlusion of these two veins. The microcatheter was then directed superiorly and posteriorly to the superior petrosal sinus. Again after having confirmed safe position of tip of the microcatheter with a gentle control arteriogram, embolization of this was performed with additional coils being positioned in addition to the previously positioned coils. Control arteriogram at this time demonstrated slightly improved opacification of the internal carotid artery into the inner cavernous branch of the cavernous sinus. Advancement of coils in this region was again met with significant resistance with herniation of coil back into the cavernous sinus. With now slightly improved visualization of the internal carotid artery distally and in the cavernous sinus attempts were now remained to access the previously positioned Atlas stent into the distal left internal carotid artery and the proximal middle cerebral artery. This was obtained using a 014 inch Chikai micro guidewire. Access over this wire was obtained with the Echelon microcatheter. This was then exchanged for a 014 inch 200 cm Transend exchange micro guidewire without difficulty. A Phenom 027 microcatheter was then advanced over the exchange micro  guidewire. However, the microcatheter continued to be met with significant resistance in the caval cavernous segment at the origin of the superior ophthalmic  vein. This was despite maneuvering of the Neuroform guide sheath into the petrous cavernous junction. This approach was then abandoned. Control arteriogram performed through the 8 [place] Neuron Max sheath in the petrous cavernous junction continued to demonstrate perfuse shunting into the left pterygoid venous plexus, and also, into the contralateral cavernous sinus via the inner cavernous communications. Slow flow to near obliteration was noted of the superior and the ophthalmic veins. A final control arteriogram performed through the Neuron Max sheath in the left internal carotid artery petrous cavernous junction demonstrated flash filling of the distal cavernous internal carotid artery. The following coils were deployed for treatment of the left-sided carotid cavernous fistula: A 5 mm x 15 cm Axium Prime coil, a 5 mm x 15 cm helix Axium Prime coil, a 5 mm x 15 cm Axium helix coil, a 5 mm x 15 cm helix Axium Prime coil, a 5 mm x 20 cm Axium Prime coil, a 6 mm x 20 cm Axium Prime helix coil, and finally a 5 mm x 20 cm helix Axium Prime coil. The Neuron Max sheath was then retrieved into the abdominal aorta and exchanged for an 8 [place] Pinnacle sheath. The sheath was then removed with hemostasis achieved in the left common femoral puncture site with manual compression. The patient's groin site appeared soft without evidence of hematoma or bleeding. Distal pulses remained palpable in the dorsalis pedis, and the posterior tibial regions bilaterally. Patient's ACT of around 240 seconds was then reversed partially with 7.5 mg of protamine sulfate IV. The patient's general anesthesia was then reversed and the patient was then extubated. Upon recovery, the patient complained of no nausea, vomiting or headaches. She was able to move all her extremities equally. The left ptosis remained stable as did the decreased movement of the left eyeball. The left pupil remained at 5 mm and sluggish reactive. The patient was able to  proceed to the left eye unchanged. She was then transferred to neuro PACU and then neuro ICU. Overnight the patient remained stable with improved sensorium. Neurologically the following day the patient demonstrated no change in the left sided ptosis, pupils on the left side minimally reactive, and also decreased movements of left eyeball. The left groin appeared soft without evidence of hematoma. Distal pulses remained stable and palpable. 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 PLAN: Patient will be transferred to the admitting team. The patient will be seen in follow-up in the clinic in 2 weeks time for possible MRI MRA prior to the visit. Patient was informed of the possible need for a diagnostic arteriogram and possible stage III embolization. The patient will be left on aspirin 81 mg a day and maintain adequate hydration. Patient was also advised to refrain from stooping, bending or lifting weights above 10 pounds for 2 weeks. She was also advised to maintain adequate hydration, and also not to drive. She was advised that should she develop new symptoms such as eye pain, nausea, vomiting, severe headaches to call 911 and come to the emergency room. She expressed understanding and agreement with the above management plan. Electronically Signed   By: Luanne Bras M.D.   On: 11/01/2018 10:28   Ir Angio Intra Extracran Sel Internal Carotid Uni L Mod Sed  Result Date: 10/25/2018 CLINICAL DATA:  Left-sided  pulsatile tinnitus. Left-sided orbital chemosis, proptosis and ptosis. Abnormal CT angiogram of the head and neck. EXAM: IR ANGIO INTRA EXTRACRAN SEL INTERNAL CAROTID UNI LEFT MOD SED; ARTERIOGRAPHY; BILATERAL COMMON CAROTID AND INNOMINATE ANGIOGRAPHY; TRANSCATHETER THERAPY EMBOLIZATION COMPARISON:  CT angiogram of the head and neck of 10/20/2018. MEDICATIONS: Heparin 1000 units IV; no antibiotic was administered within 1  hour of the procedure. ANESTHESIA/SEDATION: Versed 1 mg IV; Fentanyl 25 mcg IV. Moderate Sedation Time:  30 minutes. The patient was continuously monitored during the procedure by the interventional radiology nurse under my direct supervision. CONTRAST:  Isovue 300 approximately 60 mL. FLUOROSCOPY TIME:  Fluoroscopy Time: 12 minutes 6 seconds (747 mGy). COMPLICATIONS: None immediate. TECHNIQUE: Informed written consent was obtained from the patient after a thorough discussion of the procedural risks, benefits and alternatives. All questions were addressed. Maximal Sterile Barrier Technique was utilized including caps, mask, sterile gowns, sterile gloves, sterile drape, hand hygiene and skin antiseptic. A timeout was performed prior to the initiation of the procedure. The right groin was prepped and draped in the usual sterile fashion. Thereafter using modified Seldinger technique, transfemoral access into the right common femoral artery was obtained without difficulty. Over a 0.035 inch guidewire, a 5 French Pinnacle sheath was inserted. Through this, and also over 0.035 inch guidewire, a 5 [place] JB 1 catheter was advanced to the aortic arch region and selectively positioned in the right common carotid artery, the right subclavian artery, the left common carotid artery and the left vertebral artery. FINDINGS: Right subclavian arteriogram demonstrates a hypoplastic right vertebral artery. The vertebral artery origin is widely patent. The vessel is seen to opacify to the cranial skull base where it is seen to opacify the right posterior-inferior cerebellar artery and the right vertebrobasilar junction. Flow is also demonstrated into the distal basilar artery with mixing of unopacified blood from the more dominant contralateral left vertebral artery. The right common carotid arteriogram demonstrates the right external carotid artery and its major branches to be widely patent. The right internal carotid artery at the  bulb is widely patent. No patency is seen of the proximal 1/3 of the right internal carotid artery. Distal to this there is a fusiform smooth caliber irregularity extending to the cervical petrous junction. At the level of the distal right internal carotid artery cervical segment there is an approximately 12.3 mm x 8.2 mm aneurysm projecting inferiorly and medially. Distal to this the right internal carotid artery demonstrates wide patency. The petrous, the cavernous and the supraclinoid segments are widely patent. The right middle cerebral artery and the right anterior cerebral artery opacify into the capillary and venous phases. There is a simultaneous cross-filling via the anterior communicating artery of the left anterior cerebral artery and the left middle cerebral artery into the capillary and venous phases. Unopacified blood is seen in the region of the left internal carotid terminus and proximal left middle cerebral artery. A left common carotid arteriogram demonstrates abnormally prominent left common carotid artery. The left external carotid artery and its major branches are widely patent. The left internal carotid artery at the bulb to the level of the junction of the middle 1/3 of the left internal carotid artery is widely patent. Arising at the junction of the distal 1/3 and the middle 1/3 of the left internal carotid artery is a 16.2 mm x 11.5 mm saccular aneurysm projecting posteriorly. Distal to this the left internal carotid artery assumes a normal caliber to the petrous segment. At the level of the caval  cavernous segment of the left internal carotid artery saccular pouch is noted with immediate egress of contrast posteriorly and also medially inferiorly and also crossing the midline across the intra cavernous tributaries opacifying the contralateral right cavernous sinus and subsequently the inferior petrosal sinus on the right side. Also demonstrated is the abnormally prominent superior petrosal  sinus draining into the junction of the transverse and the left sigmoid sinus. There is spontaneous fast flow egress also inferiorly and laterally into the pterygoid plexus, and also the left inferior petrosal sinus. Also noted is abnormal prominence of the superior ophthalmic vein, and the inferior ophthalmic vein projecting anteriorly and laterally and superiorly. Distal to the caval cavernous segment, the distal cavernous and the supraclinoid segments are widely patent. However, there is significant diminution of blood supply to the left middle cerebral artery. No visible opacification of the left anterior cerebral artery is noted from the left internal carotid artery injection. Origin of the dominant left vertebral artery is widely patent. The vessel is seen to opacify to the cranial skull base. At the level of the first horizontal segment there is a 6.1 mm x 4.2 mm saccular aneurysm. Distal to this the left vertebrobasilar junction and the left posterior-inferior cerebellar artery demonstrate wide patency. The basilar artery, the posterior cerebral arteries, the superior cerebellar arteries and the anterior-inferior cerebellar arteries opacify into the capillary and venous phases. No retrograde opacification of the posterior communicating artery is seen from the left vertebral artery injection. 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 Extensive shunting of blood from the left internal carotid artery intracranially by the large fistulous communications. Cross-filling via the anterior communicating artery of the left middle cerebral artery and the left anterior cerebral  artery from the right-sided internal carotid artery injection. 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 Approximately 12.3 mm x 8.2 mm saccular aneurysm arising in the posterior wall of the right internal carotid artery and distal cervical segment. PLAN: Findings reviewed with the patient and her sister. 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 Electronically Signed   By: Luanne Bras M.D.   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  EXAM: ENDOVASCULAR STAGED EMBOLIZATION OF LEFT INTERNAL CAROTID ARTERY [facility] DIRECT FISTULA.  COMPARISON:  Diagnostic catheter arteriogram of 10/20/2018.  MEDICATIONS: Ancef 2 g IV was administered within 1 hour of the procedure.  ANESTHESIA/SEDATION: General anesthesia.  CONTRAST:  Isovue 300 approximately 120 mL.  FLUOROSCOPY TIME:  Fluoroscopy Time: 124 minutes 12 seconds (3473 mGy).  COMPLICATIONS: None immediate.  TECHNIQUE: Informed written consent was obtained from the patient after a thorough discussion of the procedural risks, benefits and alternatives. All questions were addressed. Maximal Sterile Barrier Technique was utilized including caps, mask, sterile gowns, sterile gloves, sterile drape, hand hygiene and skin antiseptic. A timeout was performed prior to the initiation of the procedure.  The patient was then put under general anesthesia by  the Department of Anesthesiology. The right groin was prepped and draped in the usual sterile fashion. Therefore, using modified Seldinger technique trans femoral access in the right common femoral artery was obtained without difficulty. Over a 0.035 inch guidewire a 5 French  Pinnacle sheath was inserted. Over a 0.035 inch guidewire and a 5 [place] JB 1 catheter was advanced to the aortic arch region and selectively positioned in the right common carotid artery and the left common carotid artery.  FINDINGS: Right common carotid arteriogram again demonstrates the right external carotid artery and their branches to be widely patent.  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  The petrous, the cavernous and the supraclinoid segments are widely patent.  The right middle cerebral artery and the right anterior cerebral artery opacify into the capillary and venous phases. There is prompt filling of the anterior communicating artery of the left anterior cerebral artery, and the left middle cerebral artery into the capillary and venous phases.  The left common carotid arteriogram demonstrates the left external carotid artery and its major branches to be widely patent. The left internal carotid at the bulb again to the cranial skull base demonstrates wide patency with the large laterally positioned aneurysm unchanged at the level of the junction of the distal and the middle 1/3 of the left internal carotid artery.  Distal to this the patency of the left internal carotid artery in the petrous segment is noted.  In the caval cavernous segment again demonstrated is the large fast flow shunting of blood into the left transverse sinus and via the intra cavernous tributaries into the right cavernous sinus and the right inferior petrosal sinus.  Fast flow is noted into the enlarged superior petrosal sinus, the left superior ophthalmic vein, the inferior ophthalmic vein, the pterygoid plexus and the left inferior petrosal sinus.  Partial flow is noted into the normal appearing distal cavernous and the left supraclinoid segments.  Partial opacification is noted of the left middle cerebral artery branches with mixing of  unopacified blood from the contralateral right internal carotid artery via the anterior communicating artery.  PROCEDURE: The diagnostic JB 1 catheter in the left common carotid artery was exchanged over a 0.035 inch 300 cm Rosen exchange guidewire for an 85 cm 8 [place] Neuron Max guide catheter.  The guidewire was removed. Good aspiration obtained from the hub of the Neuron Max sheath. Gentle control arteriogram demonstrated no evidence of spasms, dissections or of intraluminal filling defects. This was then connected to continuous heparinized saline infusion.  Over a 0.035 inch Roadrunner guidewire, using biplane roadmap technique and constant fluoroscopic guidance, a 7 French 132 cm Catalyst guide catheter was then advanced without difficulty into the horizontal petrous segment of the left internal carotid artery, the guidewire was removed. Good aspiration obtained from the hub of the 7 [place] Catalyst guide catheter. A control arteriogram performed through this again demonstrated the large fast flow left internal carotid artery directed CC fistula.  Using biplane roadmap technique and constant fluoroscopic guidance over a 0.014 inch Softip Synchro micro guidewire, a 4 mm x 11 mm extra compliant Scepter balloon which had been prepped with evacuation of air from the balloon, was advanced and positioned with its distal marker into the distal cavernous segment of the left internal carotid artery. This was then left with the micro guidewire in the supraclinoid left ICA.  Through a second by bore of a double Tuohy Spooner,  over a 0.014 inch Asahi micro guidewire, a 014 Echelon microcatheter was then advanced into the proximal cavernous segment of the left internal carotid artery.  Using a torque device, the micro guidewire was then gently manipulated and access was obtained into the posterior aspect of the left cavernous sinus at the origin of the superior petrosal sinus.  The guidewire was removed. Good  aspiration was obtained from the hub of the Echelon microcatheter. A gentle control arteriogram performed demonstrated intra cavernous positioning of the tip of the microcatheter.  Thereafter using intermittent biplane roadmap technique and constant fluoroscopic guidance, the following coils were then advanced into the cavernous sinus. A 7 mm x 20 cm Target XL coil, a 7 mm x 15 cm Target 360 soft coil, a 6 mm x 20 cm Target XL 360 soft coil, a 6 mm x 20 cm Target 360 soft coil, a 6 mm x 15 cm Target 360 soft coil, a 6 mm x 20 cm 360 standard Target coil, a Codman neuro stretch resistant 6 mm x 15 cm coil, and a 5 mm x 15 cm Target 360 soft coil. Each coil was advanced into the cavernous sinus using biplane roadmap technique and constant fluoroscopic guidance. Prior to the detachment of each coil, a control arteriogram was performed ensuring safe positioning of the coil. Also each coil was advanced into the cavernous sinus with the Scepter C balloon inflated with 50% contrast and 50% heparinized saline infusion using a 1 mL syringe.  Following the final coil, placement of subsequent coils was met with the coils extending into the distal cavernous segment of the left internal carotid artery.  A control arteriogram performed through the Neuron Max sheath in the left internal carotid artery continued to demonstrate egress through the pterygoid plexus on the left side and right side, into the right cavernous sinus via the intra cavernous tributaries, and also the superior and the inferior superior ophthalmic veins. There continued to be flow into the left internal carotid artery. Access into the supraclinoid left ICA and the left middle cerebral artery was then obtained using a Headway 17 2 tip microcatheter over a 0.014 inch standard Synchro micro guidewire.  It was decided to place a 4.5 mm x 30 mm Atlas Neuroform stent in order to provide pathway for antegrade flow into the internal carotid artery intracranially.  Also to provide a platform for reconstruction and possible further endovascular coiling in order to treat and eliminate or significant slow down flow through the left internal carotid artery direct CC fistula.  This stent was then deployed without difficulty with stable anchoring in the supraclinoid left ICA, and also the petrous cavernous segment of the left internal carotid artery.  A final control arteriogram performed through the 6 [place] Catalyst guide catheter in the horizontal petrous segment demonstrated significant reduced flow through the posterior half of the left cavernous sinus, and also the superolateral aspect of the left cavernous sinus. There continued to be significant shunting of blood into the bilateral inferior petrosal sinuses, the left pterygoid plexus and the left superior and inferior ophthalmic veins.  Catalyst guide catheter, and the Neuron Max sheath were then retrieved into the abdominal aorta and removed with hemostasis obtained in the right groin puncture site with manual compression. The right groin appeared soft without evidence of a hematoma or bleeding.  Distal pulses remained palpable in the dorsalis pedis, and the posterior tibial regions bilaterally at the end of the procedure.  Throughout the procedure, the patient's blood  pressure and neurological status remained stable.  Patient was then extubated without difficulty. Upon recovery, the patient demonstrated no new neurological signs or symptoms.  She continued to have chemosis with sluggish reaction of the left pupil. 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  Otherwise, neurologically, the patient remained stable without complaints of headaches, nausea or vomiting.  She was then transferred to the neuro ICU to continue on low-dose IV heparin. She was to continue with regular aspirin for the time being.   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  PLAN: Contemplate scheduling the second stage of the embolization in next 1-2 weeks depending on the patient's clinical condition. This was discussed with the patient, and the patient's sister.   Electronically Signed   By: Luanne Bras M.D.   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 EXAM: ENDOVASCULAR STAGED EMBOLIZATION OF LEFT INTERNAL CAROTID ARTERY [facility] DIRECT FISTULA. COMPARISON:  Diagnostic catheter arteriogram of 10/20/2018. MEDICATIONS: Ancef 2 g IV was administered within 1 hour of the procedure. ANESTHESIA/SEDATION: General anesthesia. CONTRAST:  Isovue 300 approximately 120 mL. FLUOROSCOPY TIME:  Fluoroscopy Time: 124 minutes 12 seconds (3473 mGy). COMPLICATIONS: None immediate. TECHNIQUE: Informed written consent was obtained from the patient after a thorough discussion of the procedural risks, benefits and alternatives. All questions were addressed. Maximal Sterile Barrier Technique was utilized including caps, mask, sterile gowns, sterile gloves, sterile drape, hand hygiene and skin antiseptic. A timeout was performed prior to the initiation of the procedure. The patient was then put under general anesthesia by the Department of Anesthesiology. The right groin was prepped and draped in the usual sterile fashion. Therefore, using modified Seldinger technique trans femoral access in the right common femoral artery was obtained without difficulty. Over a 0.035 inch guidewire a 5 French Pinnacle sheath was inserted. Over a 0.035 inch guidewire and a 5 [place] JB 1 catheter was advanced to the aortic arch region and  selectively positioned in the right common carotid artery and the left common carotid artery. FINDINGS: Right common carotid arteriogram again demonstrates the right external carotid artery and their branches to be widely patent. 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 The petrous, the cavernous and the supraclinoid segments are widely patent. The right middle cerebral artery and the right anterior cerebral artery opacify into the capillary and venous phases. There is prompt filling of the anterior communicating artery of the left anterior cerebral artery, and the left middle cerebral artery into the capillary and venous phases. The left common carotid arteriogram demonstrates the left external carotid artery and its major branches to be widely patent. The left internal carotid at the bulb again to the cranial skull base demonstrates wide patency with the large laterally positioned aneurysm unchanged at the level of the junction of the distal and the middle 1/3 of the left internal carotid artery. Distal to this the patency of the left internal carotid artery in the petrous segment is noted. In the caval cavernous segment again demonstrated is the large fast flow shunting of blood into the left transverse  sinus and via the intra cavernous tributaries into the right cavernous sinus and the right inferior petrosal sinus. Fast flow is noted into the enlarged superior petrosal sinus, the left superior ophthalmic vein, the inferior ophthalmic vein, the pterygoid plexus and the left inferior petrosal sinus. Partial flow is noted into the normal appearing distal cavernous and the left supraclinoid segments. Partial opacification is noted of the left middle cerebral artery branches with mixing of unopacified blood from the contralateral right internal carotid artery via the anterior communicating artery. PROCEDURE: The diagnostic JB 1 catheter in  the left common carotid artery was exchanged over a 0.035 inch 300 cm Rosen exchange guidewire for an 85 cm 8 [place] Neuron Max guide catheter. The guidewire was removed. Good aspiration obtained from the hub of the Neuron Max sheath. Gentle control arteriogram demonstrated no evidence of spasms, dissections or of intraluminal filling defects. This was then connected to continuous heparinized saline infusion. Over a 0.035 inch Roadrunner guidewire, using biplane roadmap technique and constant fluoroscopic guidance, a 7 French 132 cm Catalyst guide catheter was then advanced without difficulty into the horizontal petrous segment of the left internal carotid artery, the guidewire was removed. Good aspiration obtained from the hub of the 7 [place] Catalyst guide catheter. A control arteriogram performed through this again demonstrated the large fast flow left internal carotid artery directed CC fistula. Using biplane roadmap technique and constant fluoroscopic guidance over a 0.014 inch Softip Synchro micro guidewire, a 4 mm x 11 mm extra compliant Scepter balloon which had been prepped with evacuation of air from the balloon, was advanced and positioned with its distal marker into the distal cavernous segment of the left internal carotid artery. This was then left with the micro guidewire in the supraclinoid left ICA. Through a second by bore of a double Tuohy Borst, over a 0.014 inch Asahi micro guidewire, a 014 Echelon microcatheter was then advanced into the proximal cavernous segment of the left internal carotid artery. Using a torque device, the micro guidewire was then gently manipulated and access was obtained into the posterior aspect of the left cavernous sinus at the origin of the superior petrosal sinus. The guidewire was removed. Good aspiration was obtained from the hub of the Echelon microcatheter. A gentle control arteriogram performed demonstrated intra cavernous positioning of the tip of the  microcatheter. Thereafter using intermittent biplane roadmap technique and constant fluoroscopic guidance, the following coils were then advanced into the cavernous sinus. A 7 mm x 20 cm Target XL coil, a 7 mm x 15 cm Target 360 soft coil, a 6 mm x 20 cm Target XL 360 soft coil, a 6 mm x 20 cm Target 360 soft coil, a 6 mm x 15 cm Target 360 soft coil, a 6 mm x 20 cm 360 standard Target coil, a Codman neuro stretch resistant 6 mm x 15 cm coil, and a 5 mm x 15 cm Target 360 soft coil. Each coil was advanced into the cavernous sinus using biplane roadmap technique and constant fluoroscopic guidance. Prior to the detachment of each coil, a control arteriogram was performed ensuring safe positioning of the coil. Also each coil was advanced into the cavernous sinus with the Scepter C balloon inflated with 50% contrast and 50% heparinized saline infusion using a 1 mL syringe. Following the final coil, placement of subsequent coils was met with the coils extending into the distal cavernous segment of the left internal carotid artery. A control arteriogram performed through the Neuron Max sheath  in the left internal carotid artery continued to demonstrate egress through the pterygoid plexus on the left side and right side, into the right cavernous sinus via the intra cavernous tributaries, and also the superior and the inferior superior ophthalmic veins. There continued to be flow into the left internal carotid artery. Access into the supraclinoid left ICA and the left middle cerebral artery was then obtained using a Headway 17 2 tip microcatheter over a 0.014 inch standard Synchro micro guidewire. It was decided to place a 4.5 mm x 30 mm Atlas Neuroform stent in order to provide pathway for antegrade flow into the internal carotid artery intracranially. Also to provide a platform for reconstruction and possible further endovascular coiling in order to treat and eliminate or significant slow down flow through the left  internal carotid artery direct CC fistula. This stent was then deployed without difficulty with stable anchoring in the supraclinoid left ICA, and also the petrous cavernous segment of the left internal carotid artery. A final control arteriogram performed through the 6 [place] Catalyst guide catheter in the horizontal petrous segment demonstrated significant reduced flow through the posterior half of the left cavernous sinus, and also the superolateral aspect of the left cavernous sinus. There continued to be significant shunting of blood into the bilateral inferior petrosal sinuses, the left pterygoid plexus and the left superior and inferior ophthalmic veins. Catalyst guide catheter, and the Neuron Max sheath were then retrieved into the abdominal aorta and removed with hemostasis obtained in the right groin puncture site with manual compression. The right groin appeared soft without evidence of a hematoma or bleeding. Distal pulses remained palpable in the dorsalis pedis, and the posterior tibial regions bilaterally at the end of the procedure. Throughout the procedure, the patient's blood pressure and neurological status remained stable. Patient was then extubated without difficulty. Upon recovery, the patient demonstrated no new neurological signs or symptoms. She continued to have chemosis with sluggish reaction of the left pupil. 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 Otherwise, neurologically, the patient remained stable without complaints of headaches, nausea or vomiting. She was then transferred to the neuro ICU to continue on low-dose IV heparin. She was to continue with regular aspirin for the time being. 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 PLAN: Contemplate scheduling the second stage of the embolization in next 1-2 weeks depending on the patient's clinical condition. This was discussed with the patient, and the patient's sister. Electronically Signed   By: Luanne Bras M.D.   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 Stage II of embolization. EXAM: TRANSCATHETER THERAPY EMBOLIZATION; RADIOLOGY EXAMINATION; IR ANGIO INTRA EXTRACRAN SEL INTERNAL CAROTID UNI LEFT MOD SED; ARTERIOGRAPHY; IR ANGIO INTRA EXTRACRAN SEL COM CAROTID INNOMINATE UNI RIGHT MOD SED COMPARISON:  Arteriograms of 10/23/2018 and CT angiogram of 10/26/2018. MEDICATIONS: Heparin 2000 units IV; No antibiotic was administered within 1 hour of the procedure. ANESTHESIA/SEDATION: General anesthesia. CONTRAST:  Isovue 300 approximately 120 mL. FLUOROSCOPY TIME:  Fluoroscopy Time: 114 minutes 6 seconds (2911 mGy). COMPLICATIONS: None immediate. TECHNIQUE: Informed written  consent was obtained from the patient after a thorough discussion of the procedural risks, benefits and alternatives. All questions were addressed. Maximal Sterile Barrier Technique was utilized including caps, mask, sterile gowns, sterile gloves, sterile drape, hand hygiene and skin antiseptic. A timeout was performed prior to the initiation of the procedure. The left groin was prepped and draped in the usual sterile fashion. Thereafter using modified Seldinger technique, transfemoral access into the left common femoral artery was obtained without difficulty. Over a 0.035 inch guidewire, a 5 French Pinnacle sheath was inserted. Through this, and also over 0.035 inch guidewire, a 5 [place] JB 1 catheter was advanced to the aortic arch region and selectively positioned in the  right common carotid artery, and the left common carotid artery. FINDINGS: The right common carotid arteriogram again demonstrates the right external carotid artery and its major branches to be widely patent. The right internal carotid artery at the bulb and its proximal 1/3 appears again normal. 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 Distal to this the petrous, the cavernous and the supraclinoid segments are widely patent. The right middle cerebral artery and the right anterior cerebral artery opacify into the capillary and venous phases. There is prompt cross filling via the anterior communicating artery of the left middle cerebral artery and the left anterior cerebral artery into the capillary and venous phases. Antegrade flow of blood is seen in the supraclinoid left ICA from the left internal carotid artery. The left common carotid arteriogram demonstrates the left external carotid artery and its major branches to be widely patent. 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 Demonstrated is the large fast flow fistula of the left internal carotid artery caval cavernous region with partial opacification of the left internal carotid artery distal to the origin of the ophthalmic artery. There is partial flow noted into the left middle cerebral artery itself. Prominent fluid again is demonstrated in the left cavernous sinus with cross-filling the inner cavernous branches of the right cavernous sinus. Subsequent flow is noted into the right inferior petrosal sinus, the right petroclival veins more prominent on the left than on the right side. Prominent opacification is  noted of the left superior ophthalmic vein, the left inferior ophthalmic vein, and also the left pterygoid plexus. 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 The diagnostic JB 1 catheter in the left common carotid artery was then exchanged over a 0.035 inch 300 cm Rosen exchange guidewire for an 8 [place] 85 cm Neuron Max sheath. The guidewire was removed. Good aspiration was obtained from the hub of the Neuron Max sheath. A gentle control arteriogram performed through the sheath demonstrated no change in the extracranial or intracranial circulation. Over a 0.035 Roadrunner guidewire using biplane roadmap technique, a 6 French 126 cm Navien guide catheter was then advanced without difficulty to the horizontal petrous segment of the left internal carotid artery. The guidewire was removed. Good aspiration was obtained from the hub of the Navien guide catheter. A gentle control arteriogram demonstrated safe position the tip of the microcatheter. The Neuron Max sheath was then advanced to the junction of the  middle and distal one thirds of the left internal carotid artery. Over a 0.014 inch Softip Synchro micro guidewire, an 027 Phenom microcatheter was then advanced to the proximal cavernous segment in the left internal carotid artery. Thereafter, the wire was manipulated with the torque device with advancement of the microcatheter and also the advancement of the Navien guide catheter. However, no easy access was obtained into the previously positioned Neuroform Atlas stent. It could also not be obtained into the distal cavernous and supraclinoid left ICA. This was then replaced with an 01 Echelon microcatheter which was advanced again over a 0.014 inch Softip Synchro micro guidewire into first the inferior then the superior ophthalmic veins. After having confirmed safe position of the tip of the microcatheter dangerous anastomosis, embolization was  performed with coils these two abnormally prominent draining veins from a distal to proximal position. A final control arteriogram performed following the embolization of these two veins demonstrated near complete occlusion of these two veins. The microcatheter was then directed superiorly and posteriorly to the superior petrosal sinus. Again after having confirmed safe position of tip of the microcatheter with a gentle control arteriogram, embolization of this was performed with additional coils being positioned in addition to the previously positioned coils. Control arteriogram at this time demonstrated slightly improved opacification of the internal carotid artery into the inner cavernous branch of the cavernous sinus. Advancement of coils in this region was again met with significant resistance with herniation of coil back into the cavernous sinus. With now slightly improved visualization of the internal carotid artery distally and in the cavernous sinus attempts were now remained to access the previously positioned Atlas stent into the distal left internal carotid artery and the proximal middle cerebral artery. This was obtained using a 014 inch Chikai micro guidewire. Access over this wire was obtained with the Echelon microcatheter. This was then exchanged for a 014 inch 200 cm Transend exchange micro guidewire without difficulty. A Phenom 027 microcatheter was then advanced over the exchange micro guidewire. However, the microcatheter continued to be met with significant resistance in the caval cavernous segment at the origin of the superior ophthalmic vein. This was despite maneuvering of the Neuroform guide sheath into the petrous cavernous junction. This approach was then abandoned. Control arteriogram performed through the 8 [place] Neuron Max sheath in the petrous cavernous junction continued to demonstrate perfuse shunting into the left pterygoid venous plexus, and also, into the contralateral cavernous  sinus via the inner cavernous communications. Slow flow to near obliteration was noted of the superior and the ophthalmic veins. A final control arteriogram performed through the Neuron Max sheath in the left internal carotid artery petrous cavernous junction demonstrated flash filling of the distal cavernous internal carotid artery. The following coils were deployed for treatment of the left-sided carotid cavernous fistula: A 5 mm x 15 cm Axium Prime coil, a 5 mm x 15 cm helix Axium Prime coil, a 5 mm x 15 cm Axium helix coil, a 5 mm x 15 cm helix Axium Prime coil, a 5 mm x 20 cm Axium Prime coil, a 6 mm x 20 cm Axium Prime helix coil, and finally a 5 mm x 20 cm helix Axium Prime coil. The Neuron Max sheath was then retrieved into the abdominal aorta and exchanged for an 8 [place] Pinnacle sheath. The sheath was then removed with hemostasis achieved in the left common femoral puncture site with manual compression. The patient's groin site appeared soft without evidence of hematoma or  bleeding. Distal pulses remained palpable in the dorsalis pedis, and the posterior tibial regions bilaterally. Patient's ACT of around 240 seconds was then reversed partially with 7.5 mg of protamine sulfate IV. The patient's general anesthesia was then reversed and the patient was then extubated. Upon recovery, the patient complained of no nausea, vomiting or headaches. She was able to move all her extremities equally. The left ptosis remained stable as did the decreased movement of the left eyeball. The left pupil remained at 5 mm and sluggish reactive. The patient was able to proceed to the left eye unchanged. She was then transferred to neuro PACU and then neuro ICU. Overnight the patient remained stable with improved sensorium. Neurologically the following day the patient demonstrated no change in the left sided ptosis, pupils on the left side minimally reactive, and also decreased movements of left eyeball. The left groin appeared  soft without evidence of hematoma. Distal pulses remained stable and palpable. 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 PLAN: Patient will be transferred to the admitting team. The patient will be seen in follow-up in the clinic in 2 weeks time for possible MRI MRA prior to the visit. Patient was informed of the possible need for a diagnostic arteriogram and possible stage III embolization. The patient will be left on aspirin 81 mg a day and maintain adequate hydration. Patient was also advised to refrain from stooping, bending or lifting weights above 10 pounds for 2 weeks. She was also advised to maintain adequate hydration, and also not to drive. She was advised that should she develop new symptoms such as eye pain, nausea, vomiting, severe headaches to call 911 and come to the emergency room. She expressed understanding and agreement with the above management plan. Electronically Signed   By: Luanne Bras M.D.   On: 11/01/2018 10:28    Labs:  CBC: Recent Labs    10/31/18 0530 11/01/18 0443 11/01/18 1354 11/02/18 0345  WBC 14.3* 14.7* 12.9* 12.5*  HGB 11.5* 8.5* 9.7* 9.7*  HCT 35.0* 26.3* 29.7* 29.6*  PLT 387 281 287 310    COAGS: Recent Labs    10/22/18 0923 10/29/18 0726 10/31/18 0530  INR 1.0 1.1 1.1    BMP: Recent Labs    10/29/18 0726 10/30/18 1434 10/31/18 0530 11/01/18 0443  NA 137 134* 139 139  K 3.3* 2.9* 3.7 3.3*  CL 97* 100 100 107  CO2 _0 GLUCOSE 102* 258* 98 86  BUN _1 CALCIUM 9.6 9.3 9.8 8.4*  CREATININE 0.71 0.84 0.87 0.90  GFRNONAA >60 >60 >60 >60  GFRAA >60 >60 >60 >60    LIVER FUNCTION TESTS: Recent Labs    07/11/18 1300 10/19/18 1837 10/26/18 1129 10/29/18 0726  BILITOT 1.5* 1.4* 1.4* 1.1  AST 45* 25 35 14*  ALT 13 12 32 18  ALKPHOS 49 57 49 47  PROT 7.6 8.6* 7.6 6.6  ALBUMIN 4.3 5.2* 4.1 3.6     Assessment and Plan:  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 Estanislado Pandy, s/p staged embolization using coiling 10/31/2018 by Dr. Estanislado Pandy. Dr. Estanislado Pandy was present for consultation.  Discussed patient's left eye. Patient states that she saw her ophthalmologist last week, and is going again on Friday. Instructed patient to follow-up with her ophthalmologist as directed.  Discussed patient's weight. Patient states that she is trying to gain wait.  Instructed patient to eat 3 meals a day and to eat lots of protein (meats) and dairy.  Discussed patient's staged embolization. Recommend that patient undergo staged embolization following repeat MRI/MRA scan.  Plan for follow-up with a MRI/MRA brain/head (without contrast) ASAP. Following results of this scan, will set up for staged embolization. Informed patient that our schedulers will call her to set up this imaging scan, and that once results are reviewed, we will set her up for staged embolization. Instructed patient to continue taking Aspirin 81 mg once daily.  All questions answered and concerns addressed. Patient conveys understanding and agrees with plan.  Thank you for this interesting consult.  I greatly enjoyed meeting [name] and look forward to participating in their care.  A copy of this report was sent to the requesting provider on this date.  Electronically Signed: Earley Abide, PA-C 11/14/2018, 12:32 PM   I spent a total of 40 Minutes in face to face in clinical consultation, greater than 50% of which was counseling/coordinating care for left ICA cavernous fistula/aneurysm s/p stagedembolization.

## 2018-11-20 ENCOUNTER — Other Ambulatory Visit (HOSPITAL_COMMUNITY): Payer: Self-pay | Admitting: Interventional Radiology

## 2018-11-20 ENCOUNTER — Other Ambulatory Visit: Payer: Self-pay | Admitting: Internal Medicine

## 2018-11-20 ENCOUNTER — Telehealth (HOSPITAL_COMMUNITY): Payer: Self-pay

## 2018-11-20 DIAGNOSIS — I729 Aneurysm of unspecified site: Secondary | ICD-10-CM

## 2018-11-20 DIAGNOSIS — I671 Cerebral aneurysm, nonruptured: Secondary | ICD-10-CM

## 2018-11-20 NOTE — Telephone Encounter (Signed)
Called to schedule mri, no answer, left vm. AW 

## 2018-11-22 ENCOUNTER — Other Ambulatory Visit: Payer: Self-pay

## 2018-11-22 ENCOUNTER — Ambulatory Visit (HOSPITAL_COMMUNITY)
Admission: RE | Admit: 2018-11-22 | Discharge: 2018-11-22 | Disposition: A | Discharge status: Home / Self Care | Payer: 59 | Source: Ambulatory Visit | Attending: Interventional Radiology | Admitting: Interventional Radiology

## 2018-11-22 ENCOUNTER — Other Ambulatory Visit (HOSPITAL_COMMUNITY): Payer: Self-pay | Admitting: Interventional Radiology

## 2018-11-22 ENCOUNTER — Ambulatory Visit (HOSPITAL_COMMUNITY): Admission: RE | Admit: 2018-11-22 | Payer: 59 | Source: Ambulatory Visit

## 2018-11-22 DIAGNOSIS — I671 Cerebral aneurysm, nonruptured: Secondary | ICD-10-CM

## 2018-11-22 DIAGNOSIS — I729 Aneurysm of unspecified site: Secondary | ICD-10-CM | POA: Insufficient documentation

## 2018-11-22 NOTE — Telephone Encounter (Signed)
Please advise 

## 2018-11-22 NOTE — Telephone Encounter (Signed)
Pt called to check the status of these refills.  She stated that she just got of the hospital and is completely out of these meds.  She stated that there nerves are really bad because of all the procedures she is having to have done.  She stated that she is really in need of this medication.    Best number  [phone]

## 2018-11-28 ENCOUNTER — Ambulatory Visit (HOSPITAL_COMMUNITY): Payer: 59

## 2018-11-29 ENCOUNTER — Other Ambulatory Visit (HOSPITAL_COMMUNITY): Payer: Self-pay | Admitting: Interventional Radiology

## 2018-11-29 DIAGNOSIS — I671 Cerebral aneurysm, nonruptured: Secondary | ICD-10-CM

## 2018-11-30 ENCOUNTER — Other Ambulatory Visit (HOSPITAL_COMMUNITY): Payer: 59

## 2018-12-01 ENCOUNTER — Other Ambulatory Visit (HOSPITAL_COMMUNITY)
Admission: RE | Admit: 2018-12-01 | Discharge: 2018-12-01 | Disposition: A | Discharge status: Home / Self Care | Payer: 59 | Source: Ambulatory Visit | Attending: Interventional Radiology | Admitting: Interventional Radiology

## 2018-12-01 DIAGNOSIS — Z1159 Encounter for screening for other viral diseases: Secondary | ICD-10-CM | POA: Insufficient documentation

## 2018-12-03 LAB — NOVEL CORONAVIRUS, NAA (HOSP ORDER, SEND-OUT TO REF LAB; TAT 18-24 HRS): SARS-CoV-2, NAA: NOT DETECTED

## 2018-12-04 ENCOUNTER — Other Ambulatory Visit: Payer: Self-pay | Admitting: Student

## 2018-12-04 ENCOUNTER — Other Ambulatory Visit: Payer: Self-pay

## 2018-12-04 ENCOUNTER — Encounter (HOSPITAL_COMMUNITY): Payer: Self-pay | Admitting: *Deleted

## 2018-12-04 NOTE — Progress Notes (Signed)
[name] denies chest pain or shortness of breath.  Patient was tested for COVID 12/01/2018 and has been in quarantine with her sister since.

## 2018-12-04 NOTE — Anesthesia Preprocedure Evaluation (Addendum)
Anesthesia Evaluation    History of Anesthesia Complications (+) PONV  Airway Mallampati: II  TM Distance: >3 FB Neck ROM: Full    Dental no notable dental hx.    Pulmonary asthma , COPD,    Pulmonary exam normal breath sounds clear to auscultation       Cardiovascular hypertension, Pt. on medications Normal cardiovascular exam Rhythm:Regular Rate:Normal     Neuro/Psych  Headaches, Anxiety Depression    GI/Hepatic GERD  ,  Endo/Other    Renal/GU      Musculoskeletal   Abdominal   Peds  Hematology   Anesthesia Other Findings   Reproductive/Obstetrics                            Anesthesia Physical Anesthesia Plan  ASA: III  Anesthesia Plan: General   Post-op Pain Management:    Induction: Intravenous  PONV Risk Score and Plan: 4 or greater and Ondansetron, Dexamethasone, Midazolam, Scopolamine patch - Pre-op and Treatment may vary due to age or medical condition  Airway Management Planned: Oral ETT  Additional Equipment: Arterial line  Intra-op Plan:   Post-operative Plan: Extubation in OR  Informed Consent: I have reviewed the patients History and Physical, chart, labs and discussed the procedure including the risks, benefits and alternatives for the proposed anesthesia with the patient or authorized representative who has indicated his/her understanding and acceptance.     Dental advisory given  Plan Discussed with: CRNA  Anesthesia Plan Comments: (PAT note written 12/04/2018 by Myra Gianotti, PA-C. )       Anesthesia Quick Evaluation

## 2018-12-04 NOTE — Progress Notes (Signed)
Anesthesia Chart Review: [name]   Case: [id number] Date/Time: 12/05/18 0815   Procedure: EMBOLIZATION (N/A )   Anesthesia type: General   Pre-op diagnosis: BRAIN ANEURYSM   [facility]: [facility] / [facility]   Surgeon: [name], [name], MD      DISCUSSION: Patient is a 48 year old female scheduled for "embolization" by [name], [name], MD. She is s/p staged left ICA cavernous fistula/aneurysm embolization using coiling and stenting 10/23/2018 and 10/31/18. 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 IR advised to continue ASA 81 mg daily.    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  Pre-procedure COVID test negative on 12/01/18. Patient is a same day Gordon, so she will get labs and anesthesia team evaluation on the day of her procedure.   PROVIDERS: Plotnikov, Georgina QuintAleksei V, MD is PCP  York SpanielWillis, Charles, K, MD is neurologist   LABS: She is for updated labs prior to surgery. As of 11/02/18, Cr 0.90, H/H 9.7/29.6, PLT 310.    OTHER: EEG 07/24/18: Impression: This is a normal EEG recording in the waking state. No evidence of ictal or interictal discharges are seen.   IMAGES: MRI/MRA brain/head 11/22/18: IMPRESSION: 1. Abnormal MRA flow signal in the cavernous sinus suggesting persistent Carotid Cavernous Fistula. The cavernous segment of the left ICA is difficult to delineate, but the left ICA terminus remains patent albeit with mildly asymmetric decreased flow signal compared to the right. 2. 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 3. Stable noncontrast MRI appearance of the brain since 2019. No new intracranial abnormality.  1V CXR 10/26/18: FINDINGS: The patient has very small bilateral pleural effusions. Lungs  are clear. Heart size is normal. No pneumothorax. No acute bony abnormality. IMPRESSION: Small bilateral pleural effusions.  Lungs are clear.  CTA Chest 07/11/18: - Lungs/Pleura: Patchy areas of ground-glass opacity in the right lung base have improved. Left basilar opacities are unchanged. No pleural effusion. No new area of consolidation. Previously described cystic area is less evident on the current study. There are smaller cystic foci elsewhere in the right lung that are unchanged. IMPRESSION: 1. No pulmonary embolus or acute aortic syndrome. 2. Improved aeration of the right lung base. Unchanged left basilar opacities.   EKG: 07/11/18: SR   CV: Echo 06/14/18: Study Conclusions - Left ventricle: The cavity size was normal. There was mild focal   basal hypertrophy of the septum. Systolic function was normal.   The estimated ejection fraction was in the range of 60% to 65%.   Images were inadequate for LV wall motion assessment. Left   ventricular diastolic function parameters were normal. No   definite LVOT flow acceleration or evidence of obstruction. No   systolic anterior motion detected. Imaging suboptimal for   detection of mid-cavitary gradients. - Aortic valve: Transvalvular velocity was within the normal range.   There was no stenosis. There was no regurgitation. - Mitral valve: Transvalvular velocity was within the normal range.   There was no evidence for stenosis. There was trivial   regurgitation. - Left atrium: The atrium was normal in size. - Right ventricle: The cavity size was normal. Wall thickness was   normal. Systolic function was normal. - Right atrium: The atrium was normal in size. - Atrial septum: No defect or patent foramen ovale was identified. - Tricuspid valve: There was  trivial regurgitation. - Inferior vena cava: The vessel was normal in size. The   respirophasic diameter changes were in the normal range (= 50%),   consistent with normal  central venous pressure. - Pericardium, extracardiac: There was no pericardial effusion. Impressions: - Normal biventricular function, LVEF 60-65%. Images inadequate for   the assessment of subtle regional wall motion abnormalities,   grossly normal wall motion. No evidence of LVOT obstruction or   systolic anterior motion of the mitral valve. No hemodynamically   significant valvular heart disease. IVC appears underfilled and   collapses easily.   Past Medical History:  Diagnosis Date  . Allergic rhinitis   . Anxiety   . B12 deficiency   . Barrett's esophagus   . Chronic constipation   . 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 Depression   . GERD (gastroesophageal reflux disease)   . Hiatal hernia   . History of DVT of lower extremity    08-14-2003  RIGHT LOWER EXTREMITY-- TREATED W/ COUMADIN----  per pt none since  . History of palpitations   . Hypertension   . Idiopathic chronic gout    12-20-2017  per pt stable ,   last episode 02/ 2019  . Iron deficiency anemia   . Migraines   . Personal history of PE (pulmonary embolism)    03/ 2005 RIGHT SIDE  --- treated w/ coumadin---  per pt none since  . PONV (postoperative nausea and vomiting)   . Tinea versicolor   . Vitamin D deficiency     Past Surgical History:  Procedure Laterality Date  . BIOPSY  11/29/2017   Procedure: BIOPSY;  Surgeon: Clarene Essex, MD;  Location: [facility];  Service: Endoscopy;;  . BRONCHOSCOPY  05-20-2004  dr clance  . ESOPHAGOGASTRODUODENOSCOPY (EGD) WITH PROPOFOL N/A 11/29/2017   Procedure: ESOPHAGOGASTRODUODENOSCOPY (EGD) WITH PROPOFOL;  Surgeon: Clarene Essex, MD;  Location: [facility];  Service: Endoscopy;  Laterality: N/A;  . EXPLORATORY LAPAROTOMY  AGE 41    [place]   for constipation  . INSERTION OF MESH N/A 12/26/2017   Procedure: INSERTION OF BIO A MESH;  Surgeon: Ralene Ok, MD;  Location: [facility] ORS;  Service: General;  Laterality: N/A;  . IR ANGIO  INTRA EXTRACRAN SEL COM CAROTID INNOMINATE BILAT MOD SED  10/20/2018  . IR ANGIO INTRA EXTRACRAN SEL COM CAROTID INNOMINATE UNI R MOD SED  10/31/2018  . IR ANGIO INTRA EXTRACRAN SEL INTERNAL CAROTID UNI L MOD SED  10/23/2018  . IR ANGIO INTRA EXTRACRAN SEL INTERNAL CAROTID UNI L MOD SED  10/31/2018  . IR ANGIO VERTEBRAL SEL SUBCLAVIAN INNOMINATE UNI R MOD SED  10/20/2018  . IR ANGIO VERTEBRAL SEL VERTEBRAL UNI L MOD SED  10/20/2018  . IR ANGIOGRAM FOLLOW UP STUDY  10/23/2018  . IR ANGIOGRAM FOLLOW UP STUDY  10/31/2018  . IR NEURO EACH ADD'L AFTER BASIC UNI LEFT (MS)  10/31/2018  . IR TRANSCATH/EMBOLIZ  10/23/2018  . IR TRANSCATH/EMBOLIZ  10/31/2018  . KNEE ARTHROSCOPY W/ ACL RECONSTRUCTION Right 09/2003  . LAPAROSCOPY BILATERAL TUBAL FULGERATION AND ATTEMPTED THERMACHOICE ABLATION WITH UTERINE PERFORATION  07-17-2009   dr Willis Modena  [facility]  . LUNG LOBECTOMY  1991   right lower lobe for massive hemoptyosis  . MINI-THORACOTOMY WEDGE RESECTION OF LEFT LOWER LOBE CYST Left 06-24-2004   dr Arlyce Dice [facility]   hemoptyosis  . RADIOLOGY WITH ANESTHESIA N/A 10/23/2018   Procedure: IR WITH ANESTHESIA FOR EMBOLIZATION;  Surgeon: Estanislado Pandy,  Simonne MaffucciSanjeev, MD;  Location: [facility];  Service: Radiology;  Laterality: N/A;  . RADIOLOGY WITH ANESTHESIA N/A 10/31/2018   Procedure: EMBOLIZATION;  Surgeon: [name], [name], MD;  Location: [facility];  Service: Radiology;  Laterality: N/A;    MEDICATIONS: No current facility-administered medications for this encounter.    Marland Kitchen. allopurinol (ZYLOPRIM) 100 MG tablet  . aspirin 81 MG chewable tablet  . brimonidine (ALPHAGAN) 0.2 % ophthalmic solution  . Cholecalciferol (VITAMIN D3) 2000 units TABS  . diazepam (VALIUM) 5 MG tablet  . ferrous sulfate 325 (65 FE) MG tablet  . hydrochlorothiazide (HYDRODIURIL) 25 MG tablet  . KLOR-CON M20 20 MEQ tablet  . latanoprost (XALATAN) 0.005 % ophthalmic solution  . Multiple Vitamin (MULTIVITAMIN WITH MINERALS) TABS tablet  . polyvinyl alcohol (LIQUIFILM TEARS)  1.4 % ophthalmic solution  . promethazine (PHENERGAN) 12.5 MG tablet  . timolol (TIMOPTIC) 0.5 % ophthalmic solution  . White Petrolatum-Mineral Oil (STYE) 31.9-57.7 % OINT  . promethazine-dextromethorphan (PROMETHAZINE-DM) 6.25-15 MG/5ML syrup     Shonna ChockAllison Lexie Morini, PA-C Surgical Short Stay/Anesthesiology [facility] Phone [phone] [facility] Phone [phone] 12/04/2018 12:47 PM

## 2018-12-05 ENCOUNTER — Encounter (HOSPITAL_COMMUNITY)
Admission: AD | Disposition: A | Discharge status: Home / Self Care | Payer: Self-pay | Source: Home / Self Care | Attending: Interventional Radiology

## 2018-12-05 ENCOUNTER — Ambulatory Visit (HOSPITAL_COMMUNITY): Payer: 59 | Admitting: Vascular Surgery

## 2018-12-05 ENCOUNTER — Ambulatory Visit (HOSPITAL_COMMUNITY)
Admission: RE | Admit: 2018-12-05 | Discharge: 2018-12-05 | Disposition: A | Discharge status: Home / Self Care | Payer: 59 | Source: Ambulatory Visit | Attending: Interventional Radiology | Admitting: Interventional Radiology

## 2018-12-05 ENCOUNTER — Inpatient Hospital Stay (HOSPITAL_COMMUNITY)
Admission: AD | Admit: 2018-12-05 | Discharge: 2018-12-07 | DRG: 027 | Disposition: A | Discharge status: Home / Self Care | Payer: 59 | Attending: Interventional Radiology | Admitting: Interventional Radiology

## 2018-12-05 ENCOUNTER — Encounter (HOSPITAL_COMMUNITY): Payer: Self-pay | Admitting: Certified Registered Nurse Anesthetist

## 2018-12-05 ENCOUNTER — Encounter (HOSPITAL_COMMUNITY): Payer: 59

## 2018-12-05 ENCOUNTER — Inpatient Hospital Stay (HOSPITAL_COMMUNITY): Payer: 59

## 2018-12-05 DIAGNOSIS — Z881 Allergy status to other antibiotic agents status: Secondary | ICD-10-CM

## 2018-12-05 DIAGNOSIS — M1A00X0 Idiopathic chronic gout, unspecified site, without tophus (tophi): Secondary | ICD-10-CM | POA: Diagnosis present

## 2018-12-05 DIAGNOSIS — Z86711 Personal history of pulmonary embolism: Secondary | ICD-10-CM | POA: Diagnosis not present

## 2018-12-05 DIAGNOSIS — H538 Other visual disturbances: Secondary | ICD-10-CM | POA: Diagnosis present

## 2018-12-05 DIAGNOSIS — Z882 Allergy status to sulfonamides status: Secondary | ICD-10-CM

## 2018-12-05 DIAGNOSIS — Z886 Allergy status to analgesic agent status: Secondary | ICD-10-CM | POA: Diagnosis not present

## 2018-12-05 DIAGNOSIS — K2270 Barrett's esophagus without dysplasia: Secondary | ICD-10-CM | POA: Diagnosis present

## 2018-12-05 DIAGNOSIS — Z888 Allergy status to other drugs, medicaments and biological substances status: Secondary | ICD-10-CM | POA: Diagnosis not present

## 2018-12-05 DIAGNOSIS — K219 Gastro-esophageal reflux disease without esophagitis: Secondary | ICD-10-CM | POA: Diagnosis present

## 2018-12-05 DIAGNOSIS — I671 Cerebral aneurysm, nonruptured: Principal | ICD-10-CM

## 2018-12-05 DIAGNOSIS — Z86718 Personal history of other venous thrombosis and embolism: Secondary | ICD-10-CM | POA: Diagnosis not present

## 2018-12-05 DIAGNOSIS — F419 Anxiety disorder, unspecified: Secondary | ICD-10-CM | POA: Diagnosis present

## 2018-12-05 DIAGNOSIS — I10 Essential (primary) hypertension: Secondary | ICD-10-CM | POA: Diagnosis present

## 2018-12-05 DIAGNOSIS — Z885 Allergy status to narcotic agent status: Secondary | ICD-10-CM

## 2018-12-05 DIAGNOSIS — Z7982 Long term (current) use of aspirin: Secondary | ICD-10-CM | POA: Diagnosis not present

## 2018-12-05 DIAGNOSIS — K449 Diaphragmatic hernia without obstruction or gangrene: Secondary | ICD-10-CM | POA: Diagnosis present

## 2018-12-05 HISTORY — PX: RADIOLOGY WITH ANESTHESIA: SHX6223

## 2018-12-05 HISTORY — PX: IR ANGIO INTRA EXTRACRAN SEL INTERNAL CAROTID UNI L MOD SED: IMG5361

## 2018-12-05 HISTORY — PX: IR ANGIOGRAM FOLLOW UP STUDY: IMG697

## 2018-12-05 HISTORY — PX: IR VENO/JUGULAR RIGHT: IMG2275

## 2018-12-05 HISTORY — PX: IR TRANSCATH/EMBOLIZ: IMG695

## 2018-12-05 LAB — APTT
aPTT: 32 seconds (ref 24–36)
aPTT: >200 seconds (ref 24–36)

## 2018-12-05 LAB — CBC WITH DIFFERENTIAL/PLATELET
Abs Immature Granulocytes: 0.04 10*3/uL (ref 0.00–0.07)
Basophils Absolute: 0.1 10*3/uL (ref 0.0–0.1)
Basophils Relative: 0 %
Eosinophils Absolute: 0.2 10*3/uL (ref 0.0–0.5)
Eosinophils Relative: 2 %
HCT: 41 % (ref 36.0–46.0)
Hemoglobin: 13.4 g/dL (ref 12.0–15.0)
Immature Granulocytes: 0 %
Lymphocytes Relative: 25 %
Lymphs Abs: 3 10*3/uL (ref 0.7–4.0)
MCH: 29.9 pg (ref 26.0–34.0)
MCHC: 32.7 g/dL (ref 30.0–36.0)
MCV: 91.5 fL (ref 80.0–100.0)
Monocytes Absolute: 0.9 10*3/uL (ref 0.1–1.0)
Monocytes Relative: 7 %
Neutro Abs: 7.8 10*3/uL — ABNORMAL HIGH (ref 1.7–7.7)
Neutrophils Relative %: 66 %
Platelets: 321 10*3/uL (ref 150–400)
RBC: 4.48 MIL/uL (ref 3.87–5.11)
RDW: 12.6 % (ref 11.5–15.5)
WBC: 11.9 10*3/uL — ABNORMAL HIGH (ref 4.0–10.5)
nRBC: 0 % (ref 0.0–0.2)

## 2018-12-05 LAB — CBC
HCT: 25.7 % — ABNORMAL LOW (ref 36.0–46.0)
Hemoglobin: 8.5 g/dL — ABNORMAL LOW (ref 12.0–15.0)
MCH: 30 pg (ref 26.0–34.0)
MCHC: 33.1 g/dL (ref 30.0–36.0)
MCV: 90.8 fL (ref 80.0–100.0)
Platelets: 228 10*3/uL (ref 150–400)
RBC: 2.83 MIL/uL — ABNORMAL LOW (ref 3.87–5.11)
RDW: 12.7 % (ref 11.5–15.5)
WBC: 11.6 10*3/uL — ABNORMAL HIGH (ref 4.0–10.5)
nRBC: 0 % (ref 0.0–0.2)

## 2018-12-05 LAB — BASIC METABOLIC PANEL
Anion gap: 10 (ref 5–15)
BUN: 16 mg/dL (ref 6–20)
CO2: 24 mmol/L (ref 22–32)
Calcium: 9.8 mg/dL (ref 8.9–10.3)
Chloride: 105 mmol/L (ref 98–111)
Creatinine, Ser: 0.75 mg/dL (ref 0.44–1.00)
GFR calc Af Amer: >60 mL/min (ref >60)
GFR calc non Af Amer: >60 mL/min (ref >60)
Glucose, Bld: 86 mg/dL (ref 70–99)
Potassium: 3.7 mmol/L (ref 3.5–5.1)
Sodium: 139 mmol/L (ref 135–145)

## 2018-12-05 LAB — TYPE AND SCREEN
ABO/RH(D): O POS
Antibody Screen: NEGATIVE

## 2018-12-05 LAB — POCT ACTIVATED CLOTTING TIME
Activated Clotting Time: 252 seconds
Activated Clotting Time: 180 seconds

## 2018-12-05 LAB — PROTIME-INR
INR: 1 (ref 0.8–1.2)
Prothrombin Time: 13.5 seconds (ref 11.4–15.2)

## 2018-12-05 LAB — POCT I-STAT 4, (NA,K, GLUC, HGB,HCT)
Glucose, Bld: 148 mg/dL — ABNORMAL HIGH (ref 70–99)
HCT: 24 % — ABNORMAL LOW (ref 36.0–46.0)
Hemoglobin: 8.2 g/dL — ABNORMAL LOW (ref 12.0–15.0)
Potassium: 3.3 mmol/L — ABNORMAL LOW (ref 3.5–5.1)
Sodium: 143 mmol/L (ref 135–145)

## 2018-12-05 LAB — MRSA PCR SCREENING: MRSA by PCR: NEGATIVE

## 2018-12-05 LAB — PLATELET INHIBITION P2Y12: Platelet Function  P2Y12: 251 [PRU] (ref 182–335)

## 2018-12-05 SURGERY — IR WITH ANESTHESIA
Anesthesia: General

## 2018-12-05 MED ORDER — FENTANYL CITRATE (PF) 100 MCG/2ML IJ SOLN: 25.0000 ug | INTRAMUSCULAR | Status: DC | PRN

## 2018-12-05 MED ORDER — ASPIRIN 81 MG PO CHEW
81.0000 mg | CHEWABLE_TABLET | Freq: Every day | ORAL | Status: DC
  Administered 2018-12-06 – 2018-12-07 (×2): 81 mg via ORAL
  Filled 2018-12-05 (×2): qty 1

## 2018-12-05 MED ORDER — FENTANYL CITRATE (PF) 100 MCG/2ML IJ SOLN
INTRAMUSCULAR | Status: AC
  Filled 2018-12-05: qty 2

## 2018-12-05 MED ORDER — ONDANSETRON HCL 4 MG/2ML IJ SOLN
INTRAMUSCULAR | Status: DC | PRN
  Administered 2018-12-05: 4 mg via INTRAVENOUS

## 2018-12-05 MED ORDER — ASPIRIN 81 MG PO CHEW
CHEWABLE_TABLET | ORAL | Status: AC
  Administered 2018-12-05: 81 mg
  Filled 2018-12-05: qty 1

## 2018-12-05 MED ORDER — CEFAZOLIN SODIUM-DEXTROSE 2-4 GM/100ML-% IV SOLN
INTRAVENOUS | Status: AC
  Filled 2018-12-05: qty 100

## 2018-12-05 MED ORDER — ACETAMINOPHEN 650 MG RE SUPP: 650.0000 mg | RECTAL | Status: DC | PRN

## 2018-12-05 MED ORDER — IOHEXOL 300 MG/ML  SOLN
150.0000 mL | Freq: Once | INTRAMUSCULAR | Status: AC | PRN
  Administered 2018-12-05: 11:00:00 80 mL via INTRA_ARTERIAL

## 2018-12-05 MED ORDER — NITROGLYCERIN 1 MG/10 ML FOR IR/CATH LAB
INTRA_ARTERIAL | Status: AC
  Filled 2018-12-05: qty 10

## 2018-12-05 MED ORDER — CEFAZOLIN SODIUM-DEXTROSE 2-4 GM/100ML-% IV SOLN
2.0000 g | INTRAVENOUS | Status: AC
  Administered 2018-12-05: 10:00:00 2 g via INTRAVENOUS
  Filled 2018-12-05: qty 100

## 2018-12-05 MED ORDER — SUGAMMADEX SODIUM 200 MG/2ML IV SOLN
INTRAVENOUS | Status: DC | PRN
  Administered 2018-12-05: 200 mg via INTRAVENOUS

## 2018-12-05 MED ORDER — TICAGRELOR 90 MG PO TABS: 90.0000 mg | ORAL_TABLET | Freq: Two times a day (BID) | ORAL | Status: DC

## 2018-12-05 MED ORDER — MEPERIDINE HCL 25 MG/ML IJ SOLN: 6.2500 mg | INTRAMUSCULAR | Status: DC | PRN

## 2018-12-05 MED ORDER — LACTATED RINGERS IV SOLN
INTRAVENOUS | Status: DC | PRN
  Administered 2018-12-05: 09:00:00 via INTRAVENOUS

## 2018-12-05 MED ORDER — ROCURONIUM BROMIDE 50 MG/5ML IV SOSY
PREFILLED_SYRINGE | INTRAVENOUS | Status: DC | PRN
  Administered 2018-12-05: 15 mg via INTRAVENOUS
  Administered 2018-12-05: 50 mg via INTRAVENOUS

## 2018-12-05 MED ORDER — OXYCODONE HCL 5 MG/5ML PO SOLN: 5.0000 mg | Freq: Once | ORAL | Status: DC | PRN

## 2018-12-05 MED ORDER — CLEVIDIPINE BUTYRATE 0.5 MG/ML IV EMUL
0.0000 mg/h | INTRAVENOUS | Status: DC
  Administered 2018-12-06: 4 mg/h via INTRAVENOUS
  Administered 2018-12-06: 2 mg/h via INTRAVENOUS
  Filled 2018-12-05 (×3): qty 50

## 2018-12-05 MED ORDER — CLOPIDOGREL BISULFATE 75 MG PO TABS: 75.0000 mg | ORAL_TABLET | ORAL | Status: DC

## 2018-12-05 MED ORDER — ACETAMINOPHEN 325 MG PO TABS
650.0000 mg | ORAL_TABLET | ORAL | Status: DC | PRN
  Administered 2018-12-06 – 2018-12-07 (×3): 650 mg via ORAL
  Filled 2018-12-05 (×3): qty 2

## 2018-12-05 MED ORDER — PROMETHAZINE HCL 25 MG/ML IJ SOLN
INTRAMUSCULAR | Status: AC
  Filled 2018-12-05: qty 1

## 2018-12-05 MED ORDER — ACETAMINOPHEN 160 MG/5ML PO SOLN: 650.0000 mg | ORAL | Status: DC | PRN

## 2018-12-05 MED ORDER — PROPOFOL 10 MG/ML IV BOLUS
INTRAVENOUS | Status: DC | PRN
  Administered 2018-12-05: 120 mg via INTRAVENOUS

## 2018-12-05 MED ORDER — FENTANYL CITRATE (PF) 100 MCG/2ML IJ SOLN
INTRAMUSCULAR | Status: DC | PRN
  Administered 2018-12-05 (×2): 50 ug via INTRAVENOUS

## 2018-12-05 MED ORDER — CHLORHEXIDINE GLUCONATE CLOTH 2 % EX PADS: 6.0000 | MEDICATED_PAD | Freq: Every day | CUTANEOUS | Status: DC

## 2018-12-05 MED ORDER — ASPIRIN EC 81 MG PO TBEC: 81.0000 mg | DELAYED_RELEASE_TABLET | Freq: Once | ORAL | Status: DC

## 2018-12-05 MED ORDER — LABETALOL HCL 5 MG/ML IV SOLN
INTRAVENOUS | Status: DC | PRN
  Administered 2018-12-05: 5 mg via INTRAVENOUS

## 2018-12-05 MED ORDER — METOCLOPRAMIDE HCL 5 MG/ML IJ SOLN
5.0000 mg | Freq: Three times a day (TID) | INTRAMUSCULAR | Status: DC | PRN
  Administered 2018-12-05: 5 mg via INTRAVENOUS

## 2018-12-05 MED ORDER — SODIUM CHLORIDE 0.9 % IV SOLN
INTRAVENOUS | Status: DC | PRN
  Administered 2018-12-05: 10:00:00 via INTRAVENOUS

## 2018-12-05 MED ORDER — PROMETHAZINE HCL 25 MG/ML IJ SOLN
6.2500 mg | INTRAMUSCULAR | Status: AC | PRN
  Administered 2018-12-05 (×2): 6.25 mg via INTRAVENOUS

## 2018-12-05 MED ORDER — LIDOCAINE HCL (PF) 1 % IJ SOLN
INTRAMUSCULAR | Status: DC | PRN
  Administered 2018-12-05: 20 mL

## 2018-12-05 MED ORDER — MIDAZOLAM HCL 2 MG/2ML IJ SOLN
INTRAMUSCULAR | Status: AC
  Filled 2018-12-05: qty 2

## 2018-12-05 MED ORDER — ONDANSETRON HCL 4 MG/2ML IJ SOLN
4.0000 mg | Freq: Four times a day (QID) | INTRAMUSCULAR | Status: DC | PRN
  Administered 2018-12-05 – 2018-12-07 (×3): 4 mg via INTRAVENOUS
  Filled 2018-12-05 (×3): qty 2

## 2018-12-05 MED ORDER — HEPARIN SODIUM (PORCINE) 1000 UNIT/ML IJ SOLN
INTRAMUSCULAR | Status: DC | PRN
  Administered 2018-12-05: 3000 [IU] via INTRAVENOUS
  Administered 2018-12-05: 1000 [IU] via INTRAVENOUS

## 2018-12-05 MED ORDER — LIDOCAINE HCL 1 % IJ SOLN
INTRAMUSCULAR | Status: AC
  Filled 2018-12-05: qty 20

## 2018-12-05 MED ORDER — SODIUM CHLORIDE 0.9 % IV SOLN
INTRAVENOUS | Status: DC | PRN
  Administered 2018-12-05: 10:00:00 25 ug/min via INTRAVENOUS

## 2018-12-05 MED ORDER — OXYCODONE HCL 5 MG PO TABS: 5.0000 mg | ORAL_TABLET | Freq: Once | ORAL | Status: DC | PRN

## 2018-12-05 MED ORDER — TICAGRELOR 90 MG PO TABS: 180.0000 mg | ORAL_TABLET | Freq: Once | ORAL | Status: DC

## 2018-12-05 MED ORDER — LIDOCAINE 2% (20 MG/ML) 5 ML SYRINGE
INTRAMUSCULAR | Status: DC | PRN
  Administered 2018-12-05: 50 mg via INTRAVENOUS

## 2018-12-05 MED ORDER — TICAGRELOR 90 MG PO TABS
ORAL_TABLET | ORAL | Status: AC
  Administered 2018-12-05: 180 mg
  Filled 2018-12-05: qty 2

## 2018-12-05 MED ORDER — ASPIRIN 81 MG PO CHEW: 81.0000 mg | CHEWABLE_TABLET | Freq: Every day | ORAL | Status: DC

## 2018-12-05 MED ORDER — SODIUM CHLORIDE 0.9 % IV SOLN: INTRAVENOUS | Status: DC

## 2018-12-05 MED ORDER — SODIUM CHLORIDE 0.9 % IV SOLN
INTRAVENOUS | Status: DC
  Administered 2018-12-05: 16:00:00 via INTRAVENOUS

## 2018-12-05 MED ORDER — METOCLOPRAMIDE HCL 5 MG/ML IJ SOLN
INTRAMUSCULAR | Status: AC
  Filled 2018-12-05: qty 2

## 2018-12-05 MED ORDER — ASPIRIN EC 325 MG PO TBEC: 325.0000 mg | DELAYED_RELEASE_TABLET | ORAL | Status: DC

## 2018-12-05 MED ORDER — MIDAZOLAM HCL 5 MG/5ML IJ SOLN
INTRAMUSCULAR | Status: DC | PRN
  Administered 2018-12-05: 1 mg via INTRAVENOUS

## 2018-12-05 MED ORDER — NIMODIPINE 30 MG PO CAPS
0.0000 mg | ORAL_CAPSULE | ORAL | Status: AC
  Administered 2018-12-05: 60 mg via ORAL
  Filled 2018-12-05: qty 2

## 2018-12-05 NOTE — Progress Notes (Signed)
RN was told at shift report that we were using the ArtLine for measurement of BP parameters (sys 120-140).  Patient has been in the mid 140's.  Call IR MD (Dr. Estanislado Pandy) and was told that this was fine and that we would not treat BP unless the sys was consistently above 150.  Will continue to observe and act accordingly.

## 2018-12-05 NOTE — Progress Notes (Signed)
Dr. Estanislado Pandy at bedside. Sand bag to remain on patients left groin throughout the night. ISTAT hemoglobin ran. Will continue to monitor. Lianne Bushy RN BSN.

## 2018-12-05 NOTE — Transfer of Care (Signed)
Immediate Anesthesia Transfer of Care Note  Patient: [name]  Procedure(s) Performed: EMBOLIZATION (N/A )  Patient Location: PACU  Anesthesia Type:General  Level of Consciousness: awake, alert  and oriented  Airway & Oxygen Therapy: Patient Spontanous Breathing and Patient connected to face mask oxygen  Post-op Assessment: Report given to RN and Post -op Vital signs reviewed and stable  Post vital signs: Reviewed and stable, patient moves all extremities.  Last Vitals:  Vitals Value Taken Time  BP 124/82 (133/72 ABP) 12/05/18 1153  Temp    Pulse 79 12/05/18 1157  Resp 14 12/05/18 1157  SpO2 100 % 12/05/18 1157  Vitals shown include unvalidated device data.  Last Pain:  Vitals:   12/05/18 0654  TempSrc: Oral         Complications: No apparent anesthesia complications

## 2018-12-05 NOTE — Progress Notes (Addendum)
NIR.  Patient with history of left ICA cavernous fistula/aneurysm s/p staged embolization 10/23/2018 (using coils and stenting), 10/31/2018 (using coils), and this AM (using coils) by Dr. Estanislado Pandy.  Received call post-procedure from Arnette Norris, RN in PACU stating that patient was oozing from bilateral groin sites.  Saw patient at approximately 1240. On PE, patient awake and alert. Moving all 4s. Complains of "dull" headache, rated 4/10, and nausea. Arnette Norris, RN states that patient vomited blood prior to my arrival. Bilateral groin sites with blood seeping through bandages. Manual pressure held to both sides by myself, Almyra Free RTR, and Tommi Rumps RTR until hemostasis was achieved bilaterally. Right groin with hardness around incision site. Distal pulses palpable bilaterally with Doppler.  ACT 180, Hgb 8.2.  Plan to transfer to neuro ICU once bed available. STAT CT ordered to R/O ICH- negative for ICH. STAT VAS [place] bilaterally to R/O hematoma/pseudoaneurysm- results pending. Hold Brilinta until tomorrow PM dose, ok to proceed with ASA. Will check CBC in AM to determine if transfusion needed. NIR to follow.   [name], PA-C 12/05/2018, 2:44 PM

## 2018-12-05 NOTE — Progress Notes (Signed)
1155 report given to PACU RN

## 2018-12-05 NOTE — Progress Notes (Signed)
Limited arterial duplex       has been completed. Full Preliminary results can be found under CV proc through chart review. June Leap, BS, RDMS, RVT   Gave prelim results to RN

## 2018-12-05 NOTE — Anesthesia Postprocedure Evaluation (Signed)
Anesthesia Post Note  Patient: Demaya Hardge Hope  Procedure(s) Performed: EMBOLIZATION (N/A )     Patient location during evaluation: PACU Anesthesia Type: General Level of consciousness: awake and alert Pain management: pain level controlled Vital Signs Assessment: post-procedure vital signs reviewed and stable Respiratory status: spontaneous breathing, nonlabored ventilation and respiratory function stable Cardiovascular status: blood pressure returned to baseline and stable Postop Assessment: no apparent nausea or vomiting Anesthetic complications: no    Last Vitals:  Vitals:   12/05/18 1300 12/05/18 1301  BP:  98/70  Pulse: 71 71  Resp: (!) 27 16  Temp:    SpO2: 100% 100%    Last Pain:  Vitals:   12/05/18 0654  TempSrc: Oral                 Lynda Rainwater

## 2018-12-05 NOTE — Progress Notes (Signed)
NIR.  Patient with history of left ICA cavernous fistula/aneurysm s/p staged embolization 10/23/2018 (using coils and stenting), 10/31/2018 (using coils), and this AM (using coils) by Dr. Estanislado Pandy.  Patient laying in bed resting but responds to voice. Complains of nausea, states she vomited approximately 30 minutes ago (per RN about 25 cc dark red blood). Denies headache, weakness, numbness/tingling, dizziness, vision changes, hearing changes, tinnitus, or speech difficulty.  Alert, awake, and oriented x3. Speech and comprehension intact. PERRL bilaterally, left 3 mm and right 2 mm. No facial asymmetry. Tongue midline. Motor power symmetric proportional to effort. No pronator drift. Distal pulses palpable bilaterally with Doppler. Right groin incision soft without active bleeding or hematoma, left groin incision with hardness (smaller that prior exam), no active bleeding.  Plan to stay in neuro ICU overnight. STAT Hgb- if <8.0 transfuse. Vas US shows left hematoma without active bleeding- keep sandbag on left groin until AM. BP goal = 425-956 systolic. Hold Brilinta until tomorrow PM dose, ok to proceed with ASA. NIR to follow.  Carla Graff Kennadi Albany, PA-C 12/05/2018, 4:19 PM

## 2018-12-05 NOTE — Procedures (Signed)
S/P transvenous embolization of RT C[facility] feeding from the LT ICA

## 2018-12-05 NOTE — Progress Notes (Signed)
Patient ID: [name], female   DOB: [birth year], 48 y.o.   MRN: [id number] INR  post procedure Extubated. Maintaining O2 sats. Awake. Denies any H/AS,N/V or visual changes. Pupils RT @mm  Lt # mm sluggish. No facial asymmetry. Moves all 4s equally. Both groins soft. No hematoma. Distal pulses . Both DPs and PTs dopplerable. S.Rolland Steinert MD

## 2018-12-05 NOTE — Progress Notes (Signed)
Interventional Radiology called to bedside in PACU to hold pressure to bilateral groin sites.  Hematoma noted on Left groin, Right groin unremarkable, pressure held until hemostasis achieved, attempt was made to express hematoma without much success.  Groins redressed with gauze and tegaderms, distal pulses palpable.  Dr Estanislado Pandy at bedside, along with RN and PA.  JKC/CAS

## 2018-12-05 NOTE — H&P (Signed)
Chief Complaint: Patient was seen in consultation today for cerebral arteriogram with possible left carotid cavernous fistula embolization at the request of Dr Laban EmperorJ Gardner   Supervising Physician: [name], [name]  Patient Status: [facility] - Out[facility]pt  History of Present Illness: [name] is a 48 y.o. 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; and 10/31/18  Discharged after hospitalization for same Recheck 5/27--- pt was doing well Eye swelling and bruising much less Is scheduled for another stage treatment today  Pt states she is still taking ASA 81 mg daily-- not on Plavix Will order ASA 81 mg this am and Brilinta 180 mg this am per Dr [name] Still with some blurred vision on left She has seen her Ophthmologist 3x since discharge He is pleased with her improvements; doing well  Now scheduled for cerebral arteriogram with possible left carotid cavernous fistula embolization    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---   Depression    GERD (gastroesophageal reflux disease)    12/04/2018- not since surgery for Hiatal Hernia   Head injury with loss of consciousness (HCC) 05/2018   Hiatal hernia    History of blood transfusion 2007   History of DVT of lower extremity    08-14-2003  RIGHT LOWER EXTREMITY-- TREATED W/ COUMADIN----  per pt none since   History of palpitations    denies   Hypertension    Idiopathic chronic gout    12-20-2017  per pt stable ,   last episode 02/ 2019   Iron deficiency anemia    Migraines    Personal history of PE (pulmonary embolism)    03/ 2005 RIGHT SIDE  --- treated w/ coumadin---  per pt none since   Pneumonia 2019   PONV (postoperative nausea and vomiting)    Tinea versicolor    Vitamin D  deficiency     Past Surgical History:  Procedure Laterality Date   BIOPSY  11/29/2017   Procedure: BIOPSY;  Surgeon: Vida RiggerMagod, Marc, MD;  Location: [facility];  Service: Endoscopy;;   BRONCHOSCOPY  05-20-2004  dr clance   ESOPHAGOGASTRODUODENOSCOPY (EGD) WITH PROPOFOL N/A 11/29/2017   Procedure: ESOPHAGOGASTRODUODENOSCOPY (EGD) WITH PROPOFOL;  Surgeon: Vida RiggerMagod, Marc, MD;  Location: [facility];  Service: Endoscopy;  Laterality: N/A;   EXPLORATORY LAPAROTOMY  AGE 42    [facility]   for constipation   INSERTION OF MESH N/A 12/26/2017   Procedure: INSERTION OF BIO A MESH;  Surgeon: Axel Filleramirez, Armando, MD;  Location: [facility] OR[facility];  Service: General;  Laterality: N/A;   IR ANGIO INTRA EXTRACRAN SEL COM CAROTID INNOMINATE BILAT MOD SED  10/20/2018   IR ANGIO INTRA EXTRACRAN SEL COM CAROTID INNOMINATE UNI R MOD SED  10/31/2018   IR ANGIO INTRA EXTRACRAN SEL INTERNAL CAROTID UNI L MOD SED  10/23/2018   IR ANGIO INTRA EXTRACRAN SEL INTERNAL CAROTID UNI L MOD SED  10/31/2018   IR ANGIO VERTEBRAL SEL SUBCLAVIAN INNOMINATE UNI R MOD SED  10/20/2018   IR ANGIO VERTEBRAL SEL VERTEBRAL UNI L MOD SED  10/20/2018   IR ANGIOGRAM FOLLOW UP STUDY  10/23/2018   IR ANGIOGRAM FOLLOW UP STUDY  10/31/2018   IR NEURO EACH ADD'L AFTER BASIC UNI LEFT (MS)  10/31/2018  IR TRANSCATH/EMBOLIZ  10/23/2018   IR TRANSCATH/EMBOLIZ  10/31/2018   KNEE ARTHROSCOPY W/ ACL RECONSTRUCTION Right 09/2003   LAPAROSCOPY BILATERAL TUBAL FULGERATION AND ATTEMPTED THERMACHOICE ABLATION WITH UTERINE PERFORATION  07-17-2009   [facility]   LUNG LOBECTOMY  1991   right lower lobe for massive hemoptyosis   MINI-THORACOTOMY WEDGE RESECTION OF LEFT LOWER LOBE CYST Left 06-24-2004   [facility]   hemoptyosis   RADIOLOGY WITH ANESTHESIA N/A 10/23/2018   Procedure: IR WITH ANESTHESIA FOR EMBOLIZATION;  Surgeon: [name], [name], MD;  Location: [facility] OR;  Service: Radiology;  Laterality: N/A;   RADIOLOGY WITH ANESTHESIA N/A 10/31/2018   Procedure:  EMBOLIZATION;  Surgeon: [name], [name], MD;  Location: [facility] OR;  Service: Radiology;  Laterality: N/A;    Allergies: Guaifenesin, Iron, Doxepin, Pseudoeph-doxylamine-dm-apap, Pseudoephedrine, Sulfa antibiotics, Hydrocodone-acetaminophen, Metoprolol succinate, Celexa [citalopram hydrobromide], Codeine, Hydromorphone hcl, Morphine, Pantoprazole sodium, Prednisone, and Tetracyclines & related  Medications: Prior to Admission medications   Medication Sig Start Date End Date Taking? Authorizing Provider  allopurinol (ZYLOPRIM) 100 MG tablet Take 1 tablet (100 mg total) by mouth daily. Must keep visit on 11/03/17 to get refills Patient taking differently: Take 100 mg by mouth 2 (two) times daily as needed (for gout flare up). Must keep visit on 11/03/17 to get refills 11/03/17  Yes Plotnikov, Georgina QuintAleksei V, MD  aspirin 81 MG chewable tablet Chew 1 tablet (81 mg total) by mouth daily. 11/02/18  Yes Zannie CoveJoseph, Preetha, MD  brimonidine (ALPHAGAN) 0.2 % ophthalmic solution Place 1 drop into the left eye 3 (three) times daily. Patient taking differently: Place 1 drop into the left eye 2 (two) times a day.  11/02/18  Yes Zannie CoveJoseph, Preetha, MD  Cholecalciferol (VITAMIN D3) 2000 units TABS Take 2,000 Units by mouth daily.    Yes [provider]  diazepam (VALIUM) 5 MG tablet Take 1 tablet (5 mg total) by mouth every 12 (twelve) hours as needed for anxiety. Patient taking differently: Take 5 mg by mouth at bedtime.  11/22/18  Yes Plotnikov, Georgina QuintAleksei V, MD  ferrous sulfate 325 (65 FE) MG tablet Take 325 mg by mouth daily with breakfast.   Yes [provider]  hydrochlorothiazide (HYDRODIURIL) 25 MG tablet Take 1 tablet (25 mg total) by mouth daily. 11/22/18  Yes Plotnikov, Georgina QuintAleksei V, MD  KLOR-CON M20 20 MEQ tablet TAKE 1 TABLET BY MOUTH EVERY DAY Patient taking differently: Take 20 mEq by mouth daily.  11/13/18  Yes Plotnikov, Georgina QuintAleksei V, MD  latanoprost (XALATAN) 0.005 % ophthalmic solution Place 1 drop into  the left eye at bedtime. 11/02/18  Yes Zannie CoveJoseph, Preetha, MD  Multiple Vitamin (MULTIVITAMIN WITH MINERALS) TABS tablet Take 1 tablet by mouth daily. One-A-Day Multivitamin   Yes [provider]  polyvinyl alcohol (LIQUIFILM TEARS) 1.4 % ophthalmic solution Place 1 drop into the left eye 4 (four) times daily. Patient taking differently: Place 1 drop into the left eye 2 (two) times a day.  11/02/18  Yes Zannie CoveJoseph, Preetha, MD  promethazine (PHENERGAN) 12.5 MG tablet Take 1 tablet (12.5 mg total) by mouth every 8 (eight) hours as needed for nausea or vomiting. 10/19/18  Yes Myrlene Brokerrawford, Elizabeth A, MD  timolol (TIMOPTIC) 0.5 % ophthalmic solution Place 1 drop into the left eye 2 (two) times daily. 11/02/18  Yes Zannie CoveJoseph, Preetha, MD  White Petrolatum-Mineral Oil (STYE) 31.9-57.7 % OINT Place 1 application into the left eye at bedtime.   Yes [provider]  promethazine-dextromethorphan (PROMETHAZINE-DM) 6.25-15 MG/5ML syrup Take 5  mLs by mouth every 4 (four) hours as needed for nausea/vomiting. 11/18/18   [provider]     Family History  Problem Relation Age of Onset   Hypertension Other    Diabetes Other     Social History   Socioeconomic History   Marital status: Single    Spouse name: Not on file   Number of children: Not on file   Years of education: Not on file   Highest education level: Not on file  Occupational History   Not on file  Social Needs   Financial resource strain: Not on file   Food insecurity    Worry: Not on file    Inability: Not on file   Transportation needs    Medical: Not on file    Non-medical: Not on file  Tobacco Use   Smoking status: Never Smoker   Smokeless tobacco: Never Used  Substance and Sexual Activity   Alcohol use: Yes    Comment: rare, maybe 1 a year   Drug use: No   Sexual activity: Not on file    Comment: BTL  Lifestyle   Physical activity    Days per week: Not on file    Minutes per session: Not on  file   Stress: Not on file  Relationships   Social connections    Talks on phone: Not on file    Gets together: Not on file    Attends religious service: Not on file    Active member of club or organization: Not on file    Attends meetings of clubs or organizations: Not on file    Relationship status: Not on file  Other Topics Concern   Not on file  Social History Narrative   Not on file    Review of Systems: A 12 point ROS discussed and pertinent positives are indicated in the HPI above.  All other systems are negative.  Review of Systems  Constitutional: Negative for activity change, fatigue and fever.  HENT: Negative for hearing loss and tinnitus.   Eyes: Positive for visual disturbance.  Respiratory: Negative for cough and shortness of breath.   Cardiovascular: Negative for chest pain.  Gastrointestinal: Negative for abdominal pain.  Musculoskeletal: Negative for back pain and gait problem.  Neurological: Positive for headaches. Negative for dizziness, tremors, seizures, syncope, facial asymmetry, speech difficulty, weakness, light-headedness and numbness.  Psychiatric/Behavioral: Negative for behavioral problems and confusion.    Vital Signs: BP (!) 146/94    Pulse 92    Temp 97.6 F (36.4 C) (Oral)    Resp 20    Ht 5' 7.5" (1.715 m)    Wt 106 lb (48.1 kg)    LMP 10/29/2018    SpO2 100%    BMI 16.36 kg/m   Physical Exam Vitals signs reviewed.  Constitutional:      Comments: Very thin female  HENT:     Head: Atraumatic.     Mouth/Throat:     Mouth: Mucous membranes are moist.  Eyes:     Extraocular Movements: Extraocular movements intact.     Comments: Left eye swelling is less but still significant Eye lid closed Cannot open on own I can open lid and pt has good movement of eye and control  Neck:     Musculoskeletal: Normal range of motion.  Cardiovascular:     Rate and Rhythm: Normal rate and regular rhythm.     Heart sounds: Normal heart sounds.    Pulmonary:  Breath sounds: Normal breath sounds.  Abdominal:     Palpations: Abdomen is soft.     Tenderness: There is no abdominal tenderness.  Musculoskeletal: Normal range of motion.     Comments: Moves all 4s Good strength and sensation  Skin:    General: Skin is warm and dry.  Neurological:     Mental Status: She is alert and oriented to person, place, and time.  Psychiatric:        Mood and Affect: Mood normal.        Behavior: Behavior normal.        Thought Content: Thought content normal.        Judgment: Judgment normal.     Imaging: Mr Shirlee LatchMra Head YQWo Contrast  Result Date: 11/22/2018 CLINICAL DATA:  48 year old female with history of endovascularly treated left side carotid cavernous fistula. EXAM: MRI HEAD WITHOUT CONTRAST MRA HEAD WITHOUT CONTRAST TECHNIQUE: Multiplanar, multiecho pulse sequences of the brain and surrounding structures were obtained without intravenous contrast. Angiographic images of the head were obtained using MRA technique without contrast. COMPARISON:  Cerebral angiogram and stage II embolization 10/31/2018. CTA head and neck 10/26/2018 and earlier. FINDINGS: MRI HEAD FINDINGS Brain: Stable cerebral volume. Stable ventricle size and configuration. Chronic nonspecific cerebral white matter T2 and FLAIR hyperintensity greater in the left hemisphere. No restricted diffusion to suggest acute infarction. No midline shift, mass effect, evidence of mass lesion, ventriculomegaly, extra-axial collection or acute intracranial hemorrhage. Cervicomedullary junction and pituitary are within normal limits. Susceptibility weighted imaging demonstrates no chronic cerebral blood products. No cortical encephalomalacia or new cerebral signal abnormality is identified. Vascular: Major intracranial vascular flow voids are stable since 2019 with intracranial and extracranial arterial dolichoectasia. Up small prominent left posterior fossa arachnoid granulation with possible small  focus of herniated cerebellum is unchanged on series 10, image 6. Skull and upper cervical spine: Capacious posterior fossa and visible cervical spinal canal suggesting a degree of dural ectasia. Normal bone marrow signal. Sinuses/Orbits: Orbits are stable and within normal limits. Paranasal sinuses and mastoids are stable and well pneumatized. Other: Visible internal auditory structures appear normal. Scalp and face soft tissues are stable. MRA HEAD FINDINGS Stable posterior circulation with dominant and mildly dolichoectatic distal left vertebral artery. Partially visible PICA origins appear patent. Tortuous vertebrobasilar junction and basilar artery without stenosis. Patent SCA and PCA origins. Mildly tortuous bilateral PCA branches. Antegrade flow in both ICA siphons. Partially visible fusiform aneurysmal/dolichoectatic distal cervical right ICA. There is antegrade flow in the proximal left ICA siphon. There is abundant abnormal flow signal at the level of the bilateral cavernous sinus (series 12, image 64). The left ICA siphon in this region is difficult to delineate. The right siphon appears normal. There is preserved antegrade flow in the supraclinoid ICAs and both carotid termini are patent. There is mild asymmetrically decreased flow signal in the left ICA terminus, left a 1 and proximal left M1. Anterior communicating artery, visible ACA branches, right MCA M1, trifurcation and right MCA branches are within normal limits. There is no left MCA M1 or bifurcation stenosis. Visible left MCA branches are within normal limits. IMPRESSION: 1. Abnormal MRA flow signal in the cavernous sinus suggesting persistent Carotid Cavernous Fistula. The cavernous segment of the left ICA is difficult to delineate, but the left ICA terminus remains patent albeit with mildly asymmetric decreased flow signal compared to the right. 2. 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 3. Stable noncontrast MRI  appearance of the brain since 2019. No new intracranial abnormality. Electronically Signed   By: Genevie Ann M.D.   On: 11/22/2018 14:44   Mr Brain Wo Contrast  Result Date: 11/22/2018 CLINICAL DATA:  48 year old female with history of endovascularly treated left side carotid cavernous fistula. EXAM: MRI HEAD WITHOUT CONTRAST MRA HEAD WITHOUT CONTRAST TECHNIQUE: Multiplanar, multiecho pulse sequences of the brain and surrounding structures were obtained without intravenous contrast. Angiographic images of the head were obtained using MRA technique without contrast. COMPARISON:  Cerebral angiogram and stage II embolization 10/31/2018. CTA head and neck 10/26/2018 and earlier. FINDINGS: MRI HEAD FINDINGS Brain: Stable cerebral volume. Stable ventricle size and configuration. Chronic nonspecific cerebral white matter T2 and FLAIR hyperintensity greater in the left hemisphere. No restricted diffusion to suggest acute infarction. No midline shift, mass effect, evidence of mass lesion, ventriculomegaly, extra-axial collection or acute intracranial hemorrhage. Cervicomedullary junction and pituitary are within normal limits. Susceptibility weighted imaging demonstrates no chronic cerebral blood products. No cortical encephalomalacia or new cerebral signal abnormality is identified. Vascular: Major intracranial vascular flow voids are stable since 2019 with intracranial and extracranial arterial dolichoectasia. Up small prominent left posterior fossa arachnoid granulation with possible small focus of herniated cerebellum is unchanged on series 10, image 6. Skull and upper cervical spine: Capacious posterior fossa and visible cervical spinal canal suggesting a degree of dural ectasia. Normal bone marrow signal. Sinuses/Orbits: Orbits are stable and within normal limits. Paranasal sinuses and mastoids are stable and well pneumatized. Other: Visible internal auditory  structures appear normal. Scalp and face soft tissues are stable. MRA HEAD FINDINGS Stable posterior circulation with dominant and mildly dolichoectatic distal left vertebral artery. Partially visible PICA origins appear patent. Tortuous vertebrobasilar junction and basilar artery without stenosis. Patent SCA and PCA origins. Mildly tortuous bilateral PCA branches. Antegrade flow in both ICA siphons. Partially visible fusiform aneurysmal/dolichoectatic distal cervical right ICA. There is antegrade flow in the proximal left ICA siphon. There is abundant abnormal flow signal at the level of the bilateral cavernous sinus (series 12, image 64). The left ICA siphon in this region is difficult to delineate. The right siphon appears normal. There is preserved antegrade flow in the supraclinoid ICAs and both carotid termini are patent. There is mild asymmetrically decreased flow signal in the left ICA terminus, left a 1 and proximal left M1. Anterior communicating artery, visible ACA branches, right MCA M1, trifurcation and right MCA branches are within normal limits. There is no left MCA M1 or bifurcation stenosis. Visible left MCA branches are within normal limits. IMPRESSION: 1. Abnormal MRA flow signal in the cavernous sinus suggesting persistent Carotid Cavernous Fistula. The cavernous segment of the left ICA is difficult to delineate, but the left ICA terminus remains patent albeit with mildly asymmetric decreased flow signal compared to the right. 2. 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 3. Stable noncontrast MRI appearance of the brain since 2019. No new intracranial abnormality. Electronically Signed   By: Genevie Ann M.D.   On: 11/22/2018 14:44    Labs:  CBC: Recent Labs    10/31/18 0530 11/01/18 0443 11/01/18 1354 11/02/18 0345  WBC 14.3* 14.7* 12.9* 12.5*  HGB 11.5* 8.5* 9.7* 9.7*  HCT 35.0* 26.3* 29.7* 29.6*  PLT 387 281  287 310    COAGS: Recent Labs    10/22/18 0923 10/29/18 0726 10/31/18 0530  INR 1.0 1.1 1.1    BMP: Recent Labs    10/29/18 0726  10/30/18 1434 10/31/18 0530 11/01/18 0443  NA 137 134* 139 139  K 3.3* 2.9* 3.7 3.3*  CL 97* 100 100 107  CO2 GLUCOSE 102* 258* 98 86  BUN CALCIUM 9.6 9.3 9.8 8.4*  CREATININE 0.71 0.84 0.87 0.90  GFRNONAA >60 >60 >60 >60  GFRAA >60 >60 >60 >60    LIVER FUNCTION TESTS: Recent Labs    07/11/18 1300 10/19/18 1837 10/26/18 1129 10/29/18 0726  BILITOT 1.5* 1.4* 1.4* 1.1  AST 45* 25 35 14*  ALT 13 12 32 18  ALKPHOS 49 57 49 47  PROT 7.6 8.6* 7.6 6.6  ALBUMIN 4.3 5.2* 4.1 3.6    TUMOR MARKERS: No results for input(s): AFPTM, CEA, CA199, CHROMGRNA in the last 8760 hours.  Assessment and Plan:  Known Left carotid cavernous fistula Staged embo x2 so far: 5/5 and 10/31/18 Now for another cerebral arteriogram with additional staged embolization Risks and benefits of cerebral angiogram with intervention were discussed with the patient including, but not limited to bleeding, infection, vascular injury, contrast induced renal failure, stroke or even death.  This interventional procedure involves the use of X-rays and because of the nature of the planned procedure, it is possible that we will have prolonged use of X-ray fluoroscopy.  Potential radiation risks to you include (but are not limited to) the following: - A slightly elevated risk for cancer  several years later in life. This risk is typically less than 0.5% percent. This risk is low in comparison to the normal incidence of human cancer, which is 33% for women and 50% for men according to the [employer] Society. - Radiation induced injury can include skin redness, resembling a rash, tissue breakdown / ulcers and hair loss (which can be temporary or permanent).   The likelihood of either of these occurring depends on the difficulty of the procedure and  whether you are sensitive to radiation due to previous procedures, disease, or genetic conditions.   IF your procedure requires a prolonged use of radiation, you will be notified and given written instructions for further action.  It is your responsibility to monitor the irradiated area for the 2 weeks following the procedure and to notify your physician if you are concerned that you have suffered a radiation induced injury.    All of the patient's questions were answered, patient is agreeable to proceed. Consent signed and in chart.  Pt is aware if intervention is performed-- she will be admitted to Neuro ICU overnight-- plan for DC in am if stable Agreeable   Thank you for this interesting consult.  I greatly enjoyed meeting [name] and look forward to participating in their care.  A copy of this report was sent to the requesting provider on this date.  Electronically Signed: Robet Leu, PA-C 12/05/2018, 7:32 AM   I spent a total of    40 Minutes in face to face in clinical consultation, greater than 50% of which was counseling/coordinating care for staged treatment of L CC fistula

## 2018-12-05 NOTE — Anesthesia Procedure Notes (Signed)
Arterial Line Insertion Start/End6/17/2020 10:15 AM, 12/05/2018 10:16 AM Performed by: Inda Coke, CRNA, CRNA  Patient location: OOR procedure area. Preanesthetic checklist: patient identified, IV checked, site marked, risks and benefits discussed, surgical consent, monitors and equipment checked, pre-op evaluation, timeout performed and anesthesia consent Patient sedated Left, radial was placed Catheter size: 20 G Hand hygiene performed  and maximum sterile barriers used  Allen's test indicative of satisfactory collateral circulation Attempts: 1 Procedure performed without using ultrasound guided technique. Ultrasound Notes:anatomy identified Following insertion, dressing applied and Biopatch. Post procedure assessment: normal  Patient tolerated the procedure well with no immediate complications.

## 2018-12-05 NOTE — Progress Notes (Signed)
Orthopedic Tech Progress Note Patient Details:  [name] 1971/01/20 [id number] PACU RN called requesting a 10lb sand bag for patient. Patient ID: [name], female   DOB: 08-26-1970, 48 y.o.   MRN: [id number]   Janit Pagan 12/05/2018, 2:36 PM

## 2018-12-05 NOTE — Anesthesia Procedure Notes (Signed)
Procedure Name: Intubation Date/Time: 12/05/2018 10:00 AM Performed by: Inda Coke, CRNA Pre-anesthesia Checklist: Patient identified, Emergency Drugs available, Suction available and Patient being monitored Patient Re-evaluated:Patient Re-evaluated prior to induction Oxygen Delivery Method: Circle System Utilized Preoxygenation: Pre-oxygenation with 100% oxygen Induction Type: IV induction Ventilation: Mask ventilation without difficulty Laryngoscope Size: Mac and 3 Grade View: Grade I Tube type: Oral Tube size: 7.0 mm Number of attempts: 1 Airway Equipment and Method: Stylet and Oral airway Placement Confirmation: ETT inserted through vocal cords under direct vision,  positive ETCO2 and breath sounds checked- equal and bilateral Secured at: 21 cm Tube secured with: Tape Dental Injury: Teeth and Oropharynx as per pre-operative assessment

## 2018-12-06 ENCOUNTER — Encounter (HOSPITAL_COMMUNITY): Payer: Self-pay | Admitting: Interventional Radiology

## 2018-12-06 LAB — CBC WITH DIFFERENTIAL/PLATELET
Abs Immature Granulocytes: 0.07 10*3/uL (ref 0.00–0.07)
Basophils Absolute: 0 10*3/uL (ref 0.0–0.1)
Basophils Relative: 0 %
Eosinophils Absolute: 0 10*3/uL (ref 0.0–0.5)
Eosinophils Relative: 0 %
HCT: 25.2 % — ABNORMAL LOW (ref 36.0–46.0)
Hemoglobin: 8.3 g/dL — ABNORMAL LOW (ref 12.0–15.0)
Immature Granulocytes: 0 %
Lymphocytes Relative: 14 %
Lymphs Abs: 2.3 10*3/uL (ref 0.7–4.0)
MCH: 30.1 pg (ref 26.0–34.0)
MCHC: 32.9 g/dL (ref 30.0–36.0)
MCV: 91.3 fL (ref 80.0–100.0)
Monocytes Absolute: 1.2 10*3/uL — ABNORMAL HIGH (ref 0.1–1.0)
Monocytes Relative: 8 %
Neutro Abs: 12.7 10*3/uL — ABNORMAL HIGH (ref 1.7–7.7)
Neutrophils Relative %: 78 %
Platelets: 248 10*3/uL (ref 150–400)
RBC: 2.76 MIL/uL — ABNORMAL LOW (ref 3.87–5.11)
RDW: 12.9 % (ref 11.5–15.5)
WBC: 16.3 10*3/uL — ABNORMAL HIGH (ref 4.0–10.5)
nRBC: 0 % (ref 0.0–0.2)

## 2018-12-06 LAB — POCT I-STAT, CHEM 8
BUN: 9 mg/dL (ref 6–20)
Calcium, Ion: 1.05 mmol/L — ABNORMAL LOW (ref 1.15–1.40)
Chloride: 112 mmol/L — ABNORMAL HIGH (ref 98–111)
Creatinine, Ser: 0.4 mg/dL — ABNORMAL LOW (ref 0.44–1.00)
Glucose, Bld: 128 mg/dL — ABNORMAL HIGH (ref 70–99)
HCT: 25 % — ABNORMAL LOW (ref 36.0–46.0)
Hemoglobin: 8.5 g/dL — ABNORMAL LOW (ref 12.0–15.0)
Potassium: 3.1 mmol/L — ABNORMAL LOW (ref 3.5–5.1)
Sodium: 144 mmol/L (ref 135–145)
TCO2: 17 mmol/L — ABNORMAL LOW (ref 22–32)

## 2018-12-06 LAB — BASIC METABOLIC PANEL
Anion gap: 7 (ref 5–15)
BUN: 8 mg/dL (ref 6–20)
CO2: 20 mmol/L — ABNORMAL LOW (ref 22–32)
Calcium: 8.6 mg/dL — ABNORMAL LOW (ref 8.9–10.3)
Chloride: 112 mmol/L — ABNORMAL HIGH (ref 98–111)
Creatinine, Ser: 0.67 mg/dL (ref 0.44–1.00)
GFR calc Af Amer: >60 mL/min (ref >60)
GFR calc non Af Amer: >60 mL/min (ref >60)
Glucose, Bld: 107 mg/dL — ABNORMAL HIGH (ref 70–99)
Potassium: 3.4 mmol/L — ABNORMAL LOW (ref 3.5–5.1)
Sodium: 139 mmol/L (ref 135–145)

## 2018-12-06 NOTE — Progress Notes (Signed)
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 Estanislado Pandy. Patient awake and alert sitting in bed eating breakfast. States that she is tired this AM- denies headache, N/V. Spontaneously moves all extremities. Bilateral groins c/d/i, left groin with hardness (smaller than yesterday) and bruising.   Allergies: Guaifenesin, Iron, Doxepin, Pseudoeph-doxylamine-dm-apap, Pseudoephedrine, Sulfa antibiotics, Hydrocodone-acetaminophen, Metoprolol succinate, Celexa [citalopram hydrobromide], Codeine, Hydromorphone hcl, Morphine, Pantoprazole sodium, Prednisone, and Tetracyclines & related  Medications: Prior to Admission medications   Medication Sig Start Date End Date Taking? Authorizing Provider  allopurinol (ZYLOPRIM) 100 MG tablet Take 1 tablet (100 mg total) by mouth daily. Must keep visit on 11/03/17 to get refills Patient taking differently: Take 100 mg by mouth 2 (two) times daily as needed (for gout flare up). Must keep visit on 11/03/17 to get refills 11/03/17  Yes Plotnikov, Evie Lacks, MD  aspirin 81 MG chewable tablet Chew 1 tablet (81 mg total) by mouth daily. 11/02/18  Yes Domenic Polite, MD  brimonidine (ALPHAGAN) 0.2 % ophthalmic solution Place 1 drop into the left eye 3 (three) times daily. Patient taking differently: Place 1 drop into the left eye 2 (two) times a day.  11/02/18  Yes Domenic Polite, MD  Cholecalciferol (VITAMIN D3) 2000 units TABS Take 2,000 Units by mouth daily.    Yes [provider]  diazepam (VALIUM) 5 MG tablet Take 1 tablet (5 mg total) by mouth every 12 (twelve) hours as needed for anxiety. Patient taking differently: Take 5 mg by mouth at bedtime.  11/22/18  Yes Plotnikov, Evie Lacks, MD   ferrous sulfate 325 (65 FE) MG tablet Take 325 mg by mouth daily with breakfast.   Yes [provider]  hydrochlorothiazide (HYDRODIURIL) 25 MG tablet Take 1 tablet (25 mg total) by mouth daily. 11/22/18  Yes Plotnikov, Evie Lacks, MD  KLOR-CON M20 20 MEQ tablet TAKE 1 TABLET BY MOUTH EVERY DAY Patient taking differently: Take 20 mEq by mouth daily.  11/13/18  Yes Plotnikov, Evie Lacks, MD  latanoprost (XALATAN) 0.005 % ophthalmic solution Place 1 drop into the left eye at bedtime. 11/02/18  Yes Domenic Polite, MD  Multiple Vitamin (MULTIVITAMIN WITH MINERALS) TABS tablet Take 1 tablet by mouth daily. One-A-Day Multivitamin   Yes [provider]  polyvinyl alcohol (LIQUIFILM TEARS) 1.4 % ophthalmic solution Place 1 drop into the left eye 4 (four) times daily. Patient taking differently: Place 1 drop into the left eye 2 (two) times a day.  11/02/18  Yes Domenic Polite, MD  promethazine (PHENERGAN) 12.5 MG tablet Take 1 tablet (12.5 mg total) by mouth every 8 (eight) hours as needed for nausea or vomiting. 10/19/18  Yes Hoyt Koch, MD  timolol (TIMOPTIC) 0.5 % ophthalmic solution Place 1 drop into the left eye 2 (two) times daily. 11/02/18  Yes Domenic Polite, MD  White Petrolatum-Mineral Oil (STYE) 31.9-57.7 % OINT Place 1 application into the left eye at bedtime.   Yes [provider]  promethazine-dextromethorphan (PROMETHAZINE-DM) 6.25-15 MG/5ML syrup Take 5 mLs by mouth every 4 (four) hours as needed for nausea/vomiting. 11/18/18   [provider]     Vital Signs: BP 110/70   Pulse (!) 110   Temp (!) 97.4 F (36.3 C) (Oral)   Resp 17   Ht 5' 7.5" (  1.715 m)   Wt 106 lb (48.1 kg)   LMP 10/29/2018   SpO2 94%   BMI 16.36 kg/m   Physical Exam Vitals signs and nursing note reviewed.  Constitutional:      General: She is not in acute distress.    Appearance: Normal appearance.  Pulmonary:     Effort: Pulmonary effort is normal. No respiratory  distress.  Skin:    General: Skin is warm and dry.     Comments: Right groin incision soft without active bleeding or hematoma; left groin incision with hardness (less than yesterday) and bruising, no active bleeding.  Neurological:     Mental Status: She is alert.     Comments: Alert, awake, and oriented x3. Speech and comprehension intact. PERRL bilaterally, left 3 mm and right 2 mm. No facial asymmetry. Tongue midline. Motor power symmetric proportional to effort. No pronator drift. Distal pulses palpable bilaterally with Doppler.  Psychiatric:        Mood and Affect: Mood normal.        Behavior: Behavior normal.        Thought Content: Thought content normal.        Judgment: Judgment normal.     Imaging: Ct Head Wo Contrast  Result Date: 12/05/2018 CLINICAL DATA:  Acute headache following angiogram. Recent treatment of cavernous carotid fistula. EXAM: CT HEAD WITHOUT CONTRAST TECHNIQUE: Contiguous axial images were obtained from the base of the skull through the vertex without intravenous contrast. COMPARISON:  CTA head neck 10/26/2018, 10/20/2018 FINDINGS: Brain: The lower brainstem and much of the posterior fossa are obscured by streak artifact from coil material. There is periventricular hypoattenuation compatible with chronic microvascular disease. Volume loss is greater than expected for age. There is no acute intracranial hemorrhage. No midline shift or other mass effect. Vascular: Embolization material within the area of the cavernous sinus/cavernous carotid arteries and at the left orbital apex. Skull: The visualized skull base, calvarium and extracranial soft tissues are normal. Sinuses/Orbits: Moderate mucosal thickening of the left frontal sinus. No mastoid or middle ear effusion. Approximately 50% of the orbit is obscured on the left by streak artifact. No visible abnormality. The size of the superior orbital vein is normal. IMPRESSION: 1. Embolization material in the area  of the cavernous sinuses and left orbital apex, with associated streak artifact obscuring portions of the left orbit and the posterior fossa. 2. No acute intracranial abnormality. 3. Volume loss greater than expected for age. Electronically Signed   By: Deatra RobinsonKevin  Herman M.D.   On: 12/05/2018 14:01   Vas [place] Lower Extremity Arterial Duplex  Result Date: 12/05/2018 LOWER EXTREMITY ARTERIAL DUPLEX STUDY Indications: S/P endovascular left ICA cavernous fistula embolization, left              arterial stick and right venous stick.  Current ABI: N/A  Examination Guidelines: A complete evaluation includes B-mode imaging, spectral Doppler, color Doppler, and power Doppler as needed of all accessible portions of each vessel. Bilateral testing is considered an integral part of a complete examination. Limited examinations for reoccurring indications may be performed as noted.  +--------+--------+-----+--------+---------+--------+ RIGHT   PSV cm/sRatioStenosisWaveform Comments +--------+--------+-----+--------+---------+--------+ CFA Prox                     triphasic         +--------+--------+-----+--------+---------+--------+ SFA Prox                     triphasic         +--------+--------+-----+--------+---------+--------+  Venous comments: Patent CFV.  +--------+--------+-----+--------+---------+--------+ LEFT    PSV cm/sRatioStenosisWaveform Comments +--------+--------+-----+--------+---------+--------+ CFA Prox                     triphasic         +--------+--------+-----+--------+---------+--------+ SFA Prox                     triphasic         +--------+--------+-----+--------+---------+--------+ Venous comments: Patent CFV.  Summary: Right: No evidence of pseudoaneurysm, AVF, DVT or hematoma. Left: No evidence of pseudoaneurysm, AVF or DVT. Heterogenous areas noted in the mid to lateral groin- appearance of hematoma.  See table(s) above for measurements and observations.  Electronically signed by Gretta Beganodd Early MD on 12/05/2018 at 5:08:42 PM.    Final     Labs:  CBC: Recent Labs    11/02/18 0345 12/05/18 0740 12/05/18 1319 12/05/18 1321 12/06/18 0435  WBC 12.5* 11.9* 11.6*  --  16.3*  HGB 9.7* 13.4 8.5* 8.2* 8.3*  HCT 29.6* 41.0 25.7* 24.0* 25.2*  PLT 310 321 228  --  248    COAGS: Recent Labs    10/22/18 0923 10/29/18 0726 10/31/18 0530 12/05/18 0740 12/05/18 1319  INR 1.0 1.1 1.1 1.0  --   APTT  --   --   --  32 >200*    BMP: Recent Labs    10/31/18 0530 11/01/18 0443 12/05/18 0740 12/05/18 1321 12/06/18 0435  NA 139 139 139 143 139  K 3.7 3.3* 3.7 3.3* 3.4*  CL 100 107 105  --  112*  CO2 28 25 24   --  20*  GLUCOSE 98 86 86 148* 107*  BUN 11 11 16   --  8  CALCIUM 9.8 8.4* 9.8  --  8.6*  CREATININE 0.87 0.90 0.75  --  0.67  GFRNONAA >60 >60 >60  --  >60  GFRAA >60 >60 >60  --  >60    LIVER FUNCTION TESTS: Recent Labs    07/11/18 1300 10/19/18 1837 10/26/18 1129 10/29/18 0726  BILITOT 1.5* 1.4* 1.4* 1.1  AST 45* 25 35 14*  ALT 13 12 32 18  ALKPHOS 49 57 49 47  PROT 7.6 8.6* 7.6 6.6  ALBUMIN 4.3 5.2* 4.1 3.6    Assessment and Plan:  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 [name]. Patient neurologically intact without symptoms at this time. Bilateral groin incisions stable, left groin with hematoma (smaller than yesterday) and bruising. Hgb stable at 8.3 (was 8.2). Plan to stay in neuro ICU additional night for observation. Discontinue Brilinta use, continue Aspirin 81 mg once daily. Wean off Cleviprex and start home BP medications. Remove arterial line. NIR to follow.   Electronically Signed: Elwin MochaAlexandra Meir Elwood, PA-C 12/06/2018, 10:55 AM   I spent a total of 35 Minutes at the the patient's bedside AND on the patient's hospital floor or unit, greater than 50% of which was counseling/coordinating  care for left ICA cavernous fistula/aneurysm s/p staged embolization.

## 2018-12-06 NOTE — Progress Notes (Signed)
NIR.  Patient with history of left ICA cavernous fistula/aneurysm s/p staged embolization 10/23/2018 (using coils and stenting), 10/31/2018 (using coils), and this AM (using coils) by Dr. Estanislado Pandy.  Patient awake and alert laying in bed. Complains of nausea, denies vomiting. Denies headache, weakness, numbness/tingling, dizziness, vision changes, hearing changes, tinnitus, or speech difficulty.  Alert, awake, and oriented x3. Speech and comprehension intact. PERRL bilaterally, left 3 mm and right 2 mm. No facial asymmetry. Tongue midline. Motor power symmetric proportional to effort. No pronator drift. Distal pulses palpable bilaterally with Doppler. Right groin incision soft without active bleeding or hematoma, left groin incision with hardness (stable compared to this AM), no active bleeding.  Plan to stay in neuro ICU overnight- if stable, possible D/C tomorrow. Continue taking Aspirin 81 mg once daily. NIR to follow.   Bea Graff Louk, PA-C 12/06/2018, 4:44 PM

## 2018-12-07 ENCOUNTER — Encounter (HOSPITAL_COMMUNITY): Payer: Self-pay | Admitting: Interventional Radiology

## 2018-12-07 LAB — TRIGLYCERIDES: Triglycerides: 74 mg/dL (ref <150)

## 2018-12-07 NOTE — Progress Notes (Signed)
RN paged on call MD regarding hypotension (102/62 (75)) while patient is sleeping.  (Paremeters systolic [phone]).  MD alerted that as long as the patient was neurologically intact upon awakening that he is fine with this.  Will continue to observe and act accordingly.

## 2018-12-07 NOTE — Discharge Summary (Signed)
Patient ID: [name] MRN: [id number] DOB/AGE: 49/02/1971 49 y.o.  Admit date: 12/05/2018 Discharge date: 12/07/2018  Supervising Physician: [name], [name]  Patient Status: [facility]  Admission Diagnoses: Carotid-cavernous fistula  Discharge Diagnoses:  Active Problems:   Carotid-cavernous fistula   Discharged Condition: stable  Hospital Course:  [facility] for an image-guided cerebral arteriogram with staged embolization of left ICA cavernous fistula/aneurysm using coils by Dr. [name]. Of note, patient had staged embolization of this fistula 10/23/2018 using coils and stenting, and also on 10/31/2018 using coils, both by Dr. [name]. Procedure occurred without major complications and patient was transferred to PACU in stable condition (VSS, right groin (venous puncture) and left groin (arterial puncture) both stable). In PACU, patient became nauseated and vomited dark red emesis x2. Following this, RN noted oozing from bilateral groin sites. Manual pressure was held to bilateral groin sites until hemostasis was achieved. Following this, left groin site with hardness- vascular US arterial revealed hematoma but no active bleeding/pseudoaneurysm. Due to vomiting, CT head was obtained- no evidence of acute abnormality. Brilinta was discontinued due to bleeding, Reglan and Zofran given for nausea. Patient was then transferred to neuro ICU in stable condition for overnight observation. No major events occurred overnight.  On 12/06/2018, patient still with nausea but no vomiting. Right groin incision soft, left groin incision with hardness- less than prior day. Patient kept overnight for further observation. No major events occurred overnight.  This AM, patient awake and alert sitting in chair eating crackers. Still with nausea, again no vomiting. Right groin incision soft, left groin incision with hardness- again, less than prior day. Plan to discharge home  today and follow-up with Dr. [name] in clinic 2-3 weeks after discharge. In addition, patient has history of Barrett's esophagitis- possible cause of nausea/vomiting, recommend patient follow-up with her GI after discharge.   Consults: None  Significant Diagnostic Studies: Ct Head Wo Contrast  Result Date: 12/05/2018 CLINICAL DATA:  Acute headache following angiogram. Recent treatment of cavernous carotid fistula. EXAM: CT HEAD WITHOUT CONTRAST TECHNIQUE: Contiguous axial images were obtained from the base of the skull through the vertex without intravenous contrast. COMPARISON:  CTA head neck 10/26/2018, 10/20/2018 FINDINGS: Brain: The lower brainstem and much of the posterior fossa are obscured by streak artifact from coil material. There is periventricular hypoattenuation compatible with chronic microvascular disease. Volume loss is greater than expected for age. There is no acute intracranial hemorrhage. No midline shift or other mass effect. Vascular: Embolization material within the area of the cavernous sinus/cavernous carotid arteries and at the left orbital apex. Skull: The visualized skull base, calvarium and extracranial soft tissues are normal. Sinuses/Orbits: Moderate mucosal thickening of the left frontal sinus. No mastoid or middle ear effusion. Approximately 50% of the orbit is obscured on the left by streak artifact. No visible abnormality. The size of the superior orbital vein is normal. IMPRESSION: 1. Embolization material in the area of the cavernous sinuses and left orbital apex, with associated streak artifact obscuring portions of the left orbit and the posterior fossa. 2. No acute intracranial abnormality. 3. Volume loss greater than expected for age. Electronically Signed   By: Deatra RobinsonKevin  Herman M.D.   On: 12/05/2018 14:01   Mr Carla GlennMra Head Wo Contrast  Result Date: 11/22/2018 CLINICAL DATA:  48 year old female with history of endovascularly treated left side carotid cavernous fistula.  EXAM: MRI HEAD WITHOUT CONTRAST MRA HEAD WITHOUT CONTRAST TECHNIQUE: Multiplanar, multiecho pulse sequences of the brain and surrounding  structures were obtained without intravenous contrast. Angiographic images of the head were obtained using MRA technique without contrast. COMPARISON:  Cerebral angiogram and stage II embolization 10/31/2018. CTA head and neck 10/26/2018 and earlier. FINDINGS: MRI HEAD FINDINGS Brain: Stable cerebral volume. Stable ventricle size and configuration. Chronic nonspecific cerebral white matter T2 and FLAIR hyperintensity greater in the left hemisphere. No restricted diffusion to suggest acute infarction. No midline shift, mass effect, evidence of mass lesion, ventriculomegaly, extra-axial collection or acute intracranial hemorrhage. Cervicomedullary junction and pituitary are within normal limits. Susceptibility weighted imaging demonstrates no chronic cerebral blood products. No cortical encephalomalacia or new cerebral signal abnormality is identified. Vascular: Major intracranial vascular flow voids are stable since 2019 with intracranial and extracranial arterial dolichoectasia. Up small prominent left posterior fossa arachnoid granulation with possible small focus of herniated cerebellum is unchanged on series 10, image 6. Skull and upper cervical spine: Capacious posterior fossa and visible cervical spinal canal suggesting a degree of dural ectasia. Normal bone marrow signal. Sinuses/Orbits: Orbits are stable and within normal limits. Paranasal sinuses and mastoids are stable and well pneumatized. Other: Visible internal auditory structures appear normal. Scalp and face soft tissues are stable. MRA HEAD FINDINGS Stable posterior circulation with dominant and mildly dolichoectatic distal left vertebral artery. Partially visible PICA origins appear patent. Tortuous vertebrobasilar junction and basilar artery without stenosis. Patent SCA and PCA origins. Mildly tortuous bilateral  PCA branches. Antegrade flow in both ICA siphons. Partially visible fusiform aneurysmal/dolichoectatic distal cervical right ICA. There is antegrade flow in the proximal left ICA siphon. There is abundant abnormal flow signal at the level of the bilateral cavernous sinus (series 12, image 64). The left ICA siphon in this region is difficult to delineate. The right siphon appears normal. There is preserved antegrade flow in the supraclinoid ICAs and both carotid termini are patent. There is mild asymmetrically decreased flow signal in the left ICA terminus, left a 1 and proximal left M1. Anterior communicating artery, visible ACA branches, right MCA M1, trifurcation and right MCA branches are within normal limits. There is no left MCA M1 or bifurcation stenosis. Visible left MCA branches are within normal limits. IMPRESSION: 1. Abnormal MRA flow signal in the cavernous sinus suggesting persistent Carotid Cavernous Fistula. The cavernous segment of the left ICA is difficult to delineate, but the left ICA terminus remains patent albeit with mildly asymmetric decreased flow signal compared to the right. 2. 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 3. Stable noncontrast MRI appearance of the brain since 2019. No new intracranial abnormality. Electronically Signed   By: Genevie Ann M.D.   On: 11/22/2018 14:44   Mr Brain Wo Contrast  Result Date: 11/22/2018 CLINICAL DATA:  48 year old female with history of endovascularly treated left side carotid cavernous fistula. EXAM: MRI HEAD WITHOUT CONTRAST MRA HEAD WITHOUT CONTRAST TECHNIQUE: Multiplanar, multiecho pulse sequences of the brain and surrounding structures were obtained without intravenous contrast. Angiographic images of the head were obtained using MRA technique without contrast. COMPARISON:  Cerebral angiogram and stage II embolization 10/31/2018. CTA head and neck 10/26/2018 and earlier.  FINDINGS: MRI HEAD FINDINGS Brain: Stable cerebral volume. Stable ventricle size and configuration. Chronic nonspecific cerebral white matter T2 and FLAIR hyperintensity greater in the left hemisphere. No restricted diffusion to suggest acute infarction. No midline shift, mass effect, evidence of mass lesion, ventriculomegaly, extra-axial collection or acute intracranial hemorrhage. Cervicomedullary junction and pituitary are within normal limits. Susceptibility weighted imaging demonstrates no chronic  cerebral blood products. No cortical encephalomalacia or new cerebral signal abnormality is identified. Vascular: Major intracranial vascular flow voids are stable since 2019 with intracranial and extracranial arterial dolichoectasia. Up small prominent left posterior fossa arachnoid granulation with possible small focus of herniated cerebellum is unchanged on series 10, image 6. Skull and upper cervical spine: Capacious posterior fossa and visible cervical spinal canal suggesting a degree of dural ectasia. Normal bone marrow signal. Sinuses/Orbits: Orbits are stable and within normal limits. Paranasal sinuses and mastoids are stable and well pneumatized. Other: Visible internal auditory structures appear normal. Scalp and face soft tissues are stable. MRA HEAD FINDINGS Stable posterior circulation with dominant and mildly dolichoectatic distal left vertebral artery. Partially visible PICA origins appear patent. Tortuous vertebrobasilar junction and basilar artery without stenosis. Patent SCA and PCA origins. Mildly tortuous bilateral PCA branches. Antegrade flow in both ICA siphons. Partially visible fusiform aneurysmal/dolichoectatic distal cervical right ICA. There is antegrade flow in the proximal left ICA siphon. There is abundant abnormal flow signal at the level of the bilateral cavernous sinus (series 12, image 64). The left ICA siphon in this region is difficult to delineate. The right siphon appears normal.  There is preserved antegrade flow in the supraclinoid ICAs and both carotid termini are patent. There is mild asymmetrically decreased flow signal in the left ICA terminus, left a 1 and proximal left M1. Anterior communicating artery, visible ACA branches, right MCA M1, trifurcation and right MCA branches are within normal limits. There is no left MCA M1 or bifurcation stenosis. Visible left MCA branches are within normal limits. IMPRESSION: 1. Abnormal MRA flow signal in the cavernous sinus suggesting persistent Carotid Cavernous Fistula. The cavernous segment of the left ICA is difficult to delineate, but the left ICA terminus remains patent albeit with mildly asymmetric decreased flow signal compared to the right. 2. 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 3. Stable noncontrast MRI appearance of the brain since 2019. No new intracranial abnormality. Electronically Signed   By: Odessa Fleming M.D.   On: 11/22/2018 14:44   Vas [place] Lower Extremity Arterial Duplex  Result Date: 12/05/2018 LOWER EXTREMITY ARTERIAL DUPLEX STUDY Indications: S/P endovascular left ICA cavernous fistula embolization, left              arterial stick and right venous stick.  Current ABI: N/A  Examination Guidelines: A complete evaluation includes B-mode imaging, spectral Doppler, color Doppler, and power Doppler as needed of all accessible portions of each vessel. Bilateral testing is considered an integral part of a complete examination. Limited examinations for reoccurring indications may be performed as noted.  +--------+--------+-----+--------+---------+--------+  RIGHT    PSV cm/s Ratio Stenosis Waveform  Comments  +--------+--------+-----+--------+---------+--------+  CFA Prox                         triphasic           +--------+--------+-----+--------+---------+--------+  SFA Prox                         triphasic            +--------+--------+-----+--------+---------+--------+ Venous comments: Patent CFV.  +--------+--------+-----+--------+---------+--------+  LEFT     PSV cm/s Ratio Stenosis Waveform  Comments  +--------+--------+-----+--------+---------+--------+  CFA Prox                         triphasic           +--------+--------+-----+--------+---------+--------+  SFA Prox                         triphasic           +--------+--------+-----+--------+---------+--------+ Venous comments: Patent CFV.  Summary: Right: No evidence of pseudoaneurysm, AVF, DVT or hematoma. Left: No evidence of pseudoaneurysm, AVF or DVT. Heterogenous areas noted in the mid to lateral groin- appearance of hematoma.  See table(s) above for measurements and observations. Electronically signed by Gretta Began MD on 12/05/2018 at 5:08:42 PM.    Final     Treatments: Endovascular staged embolization of left ICA cavernous fistula/aneurysm using coils.  Discharge Exam: Blood pressure 121/76, pulse 89, temperature 98.1 F (36.7 C), temperature source Oral, resp. rate 12, height 5' 7.5" (1.715 m), weight 106 lb (48.1 kg), last menstrual period 10/29/2018, SpO2 100 %. Physical Exam Vitals signs and nursing note reviewed.  Constitutional:      General: She is not in acute distress.    Appearance: Normal appearance.  Cardiovascular:     Rate and Rhythm: Normal rate and regular rhythm.     Heart sounds: Normal heart sounds. No murmur.  Pulmonary:     Effort: Pulmonary effort is normal. No respiratory distress.     Breath sounds: Normal breath sounds. No wheezing.  Skin:    General: Skin is warm and dry.     Comments: Right groin incision soft without active bleeding or hematoma; left groin incision with hardness (smaller that prior exam yesterday), no active bleeding.  Neurological:     Mental Status: She is alert.     Comments: Alert, awake, and oriented x3. Speech and comprehension intact. PERRL bilaterally, left 3 mm and right 2 mm. EOMs  intact on right side; left eye slightly open (can see sclera and part of pupil) with limited medial movements No facial asymmetry. Tongue midline. Motor power symmetric proportional to effort. No pronator drift. Distal pulses palpable bilaterally with Doppler. Right groin incision soft without active bleeding or hematoma, left groin incision with hardness (smaller that prior exam), no active bleeding.  Psychiatric:        Mood and Affect: Mood normal.        Behavior: Behavior normal.        Thought Content: Thought content normal.        Judgment: Judgment normal.     Disposition: Discharge disposition: 01-Home or Self Care       Discharge Instructions    Call MD for:  difficulty breathing, headache or visual disturbances   Complete by: As directed    Call MD for:  extreme fatigue   Complete by: As directed    Call MD for:  hives   Complete by: As directed    Call MD for:  persistant dizziness or light-headedness   Complete by: As directed    Call MD for:  persistant nausea and vomiting   Complete by: As directed    Call MD for:  redness, tenderness, or signs of infection (pain, swelling, redness, odor or green/yellow discharge around incision site)   Complete by: As directed    Call MD for:  severe uncontrolled pain   Complete by: As directed    Call MD for:  temperature >100.4   Complete by: As directed    Change dressing (specify)   Complete by: As directed    Dressing change: 1 time per day using Band-Aid to left groin puncture until follow-up.   Diet - low  sodium heart healthy   Complete by: As directed    Discharge instructions   Complete by: As directed    Continue taking Aspirin 81 mg once daily. No bending, stooping, or lifting more than 10 pounds for 2 weeks. No driving self for 2 weeks. Stay hydrated by drinking plenty of water.   Increase activity slowly   Complete by: As directed      Allergies as of 12/07/2018      Reactions   Guaifenesin  Shortness Of Breath   Iron Anaphylaxis, Other (See Comments)   IV only   Doxepin Hives   Pseudoeph-doxylamine-dm-apap Hives   Pseudoephedrine Hives   Sulfa Antibiotics Hives   Hydrocodone-acetaminophen    UNSPECIFIED REACTION    Metoprolol Succinate Swelling   UNSPECIFIED EDEMA   Celexa [citalopram Hydrobromide] Other (See Comments)   HEADACHE    Codeine Other (See Comments)   tiredness   Hydromorphone Hcl Nausea And Vomiting   Morphine Other (See Comments)   Does not work; hallucinations and irritable   Pantoprazole Sodium Other (See Comments)   heartburn   Prednisone Rash   Tetracyclines & Related Rash      Medication List    STOP taking these medications   promethazine-dextromethorphan 6.25-15 MG/5ML syrup Commonly known as: PROMETHAZINE-DM     TAKE these medications   allopurinol 100 MG tablet Commonly known as: ZYLOPRIM Take 1 tablet (100 mg total) by mouth daily. Must keep visit on 11/03/17 to get refills What changed:   when to take this  reasons to take this   aspirin 81 MG chewable tablet Chew 1 tablet (81 mg total) by mouth daily.   brimonidine 0.2 % ophthalmic solution Commonly known as: ALPHAGAN Place 1 drop into the left eye 3 (three) times daily. What changed: when to take this   diazepam 5 MG tablet Commonly known as: VALIUM Take 1 tablet (5 mg total) by mouth every 12 (twelve) hours as needed for anxiety. What changed: when to take this   ferrous sulfate 325 (65 FE) MG tablet Take 325 mg by mouth daily with breakfast.   hydrochlorothiazide 25 MG tablet Commonly known as: HYDRODIURIL Take 1 tablet (25 mg total) by mouth daily.   Klor-Con M20 20 MEQ tablet Generic drug: potassium chloride SA TAKE 1 TABLET BY MOUTH EVERY DAY What changed: how much to take   latanoprost 0.005 % ophthalmic solution Commonly known as: XALATAN Place 1 drop into the left eye at bedtime.   multivitamin with minerals Tabs tablet Take 1 tablet by mouth  daily. One-A-Day Multivitamin   polyvinyl alcohol 1.4 % ophthalmic solution Commonly known as: LIQUIFILM TEARS Place 1 drop into the left eye 4 (four) times daily. What changed: when to take this   promethazine 12.5 MG tablet Commonly known as: PHENERGAN Take 1 tablet (12.5 mg total) by mouth every 8 (eight) hours as needed for nausea or vomiting.   Stye 31.9-57.7 % Oint Place 1 application into the left eye at bedtime.   timolol 0.5 % ophthalmic solution Commonly known as: TIMOPTIC Place 1 drop into the left eye 2 (two) times daily.   Vitamin D3 50 MCG (2000 UT) Tabs Take 2,000 Units by mouth daily.            Discharge Care Instructions  (From admission, onward)         Start     Ordered   12/07/18 0000  Change dressing (specify)    Comments: Dressing change: 1 time per day  using Band-Aid to left groin puncture until follow-up.   12/07/18 0959         Follow-up Information    [name], [name], MD Follow up in 3 week(s).   Specialties: Interventional Radiology, Radiology Why: Please plan to follow-up with Dr. [name] in clinic 2-3 weeks after discharge. Contact information: [address] [place] [address] [phone]        Axel Filleramirez, Armando, MD Follow up in 1 month(s).   Specialty: General Surgery Why: Please follow-up with GI regarding Barrett's esophagitis/nausea within 1 month of discharge. Contact information: [address] [place] [address] [phone]            Electronically Signed: Elwin MochaAlexandra Broderic Bara, PA-C 12/07/2018, 10:02 AM   I have spent Greater Than 30 Minutes discharging Ahlia D Heo.

## 2018-12-07 NOTE — Progress Notes (Signed)
Pt given all d/c paperwork and education. She knows to f/u with Dr Estanislado Pandy in 3 weeks and to call 911 for worsening. Pt wheeled to car (sister Vaughan Basta picking up) by Therapist, sports.

## 2018-12-08 ENCOUNTER — Telehealth: Payer: Self-pay | Admitting: Student

## 2018-12-08 NOTE — Telephone Encounter (Signed)
NIR.  Received random text page stating that patient was requesting nausea medication.  Discussed case with Dr. Estanislado Pandy who recommended patient call her GI or PCP physician for nausea medication. Called patient at [phone] to update on above. All questions answered and concerns addressed. Patient conveys understanding and agrees with plan.  Please call NIR with questions/concerns.   Bea Graff Louk, PA-C 12/08/2018, 2:39 PM

## 2018-12-13 ENCOUNTER — Telehealth (HOSPITAL_COMMUNITY): Payer: Self-pay

## 2018-12-13 NOTE — Telephone Encounter (Signed)
Called to schedule f/u consult, no answer, left vm. AW  

## 2018-12-17 ENCOUNTER — Encounter: Payer: Self-pay | Admitting: Internal Medicine

## 2018-12-19 ENCOUNTER — Telehealth: Payer: Self-pay | Admitting: Internal Medicine

## 2018-12-19 ENCOUNTER — Telehealth (HOSPITAL_COMMUNITY): Payer: Self-pay

## 2018-12-19 NOTE — Telephone Encounter (Signed)
Called to schedule consult, no answer, left vm. AW  

## 2018-12-19 NOTE — Telephone Encounter (Signed)
Pt had a medical procedure done two weeks ago and it has made her nauseous and Pt wanted to know if Dr. Alain Marion can call in Phenergan to the pharmacy for her nausea / please advise

## 2018-12-20 MED ORDER — PROMETHAZINE HCL 12.5 MG PO TABS: 12.5000 mg | ORAL_TABLET | Freq: Three times a day (TID) | ORAL | 0 refills | Status: DC | PRN

## 2018-12-20 NOTE — Telephone Encounter (Signed)
Pt notified, will call back to schedule OV

## 2018-12-20 NOTE — Telephone Encounter (Signed)
Ok - done Sch f/u OV w/me pls Thx

## 2018-12-20 NOTE — Telephone Encounter (Signed)
Please advise about refill

## 2018-12-27 ENCOUNTER — Telehealth (HOSPITAL_COMMUNITY): Payer: Self-pay

## 2018-12-27 ENCOUNTER — Ambulatory Visit (HOSPITAL_COMMUNITY): Admission: RE | Admit: 2018-12-27 | Payer: 59 | Source: Ambulatory Visit

## 2018-12-27 NOTE — Telephone Encounter (Signed)
Called to reschedule consult, no answer, left vm. AW  

## 2019-01-01 ENCOUNTER — Telehealth (HOSPITAL_COMMUNITY): Payer: Self-pay | Admitting: Radiology

## 2019-01-01 NOTE — Telephone Encounter (Signed)
Called pt's insurance company due to continued repeated calls from patient and her sister asking about these forms, which were faxed to [employer] multiple times. I spoke with Caren Griffins at [place] who states that they did received the paperwork from our office. JM

## 2019-01-02 ENCOUNTER — Other Ambulatory Visit: Payer: Self-pay

## 2019-01-02 ENCOUNTER — Ambulatory Visit (HOSPITAL_COMMUNITY)
Admission: RE | Admit: 2019-01-02 | Discharge: 2019-01-02 | Disposition: A | Discharge status: Home / Self Care | Payer: 59 | Source: Ambulatory Visit | Attending: Student | Admitting: Student

## 2019-01-02 DIAGNOSIS — I671 Cerebral aneurysm, nonruptured: Secondary | ICD-10-CM

## 2019-01-02 NOTE — Progress Notes (Signed)
Chief Complaint: Patient was seen in consultation today for left ICA cavernous fistula/aneurysm s/p stagedembolization.  Referring Physician(s): Etta Quill  Supervising Physician: Luanne Bras  Patient Status: [facility] - Out-pt  History of Present Illness: [name] is a 48 y.o. female with a past medical history as below, with pertinent past medical history including hypertension, migraines, pulmonary embolism 2005, DVT 2005, hiatal hernia, GERD, Barrett's esophagus, iron deficiency anemia, vitamin D deficiency, B12 deficiency, gout, anxiety, and depression. She is known to [facility] and has been followed by Dr. Estanislado Pandy since 07/2018. She first presented to our department at the request of Dr. [name] for management of bilateral ICA aneurysms. She consulted with Dr. Estanislado Pandy 08/17/2018 to discuss management options. At that time, patient decided to pursue endovascular intervention for her aneurysms. Unfortunately, due to restrictions regarding COVID-19, this has been postponed.  Patient presented to [facility] ED 10/19/2018 with complaint of tinnitus and left eye pain. 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 She underwent an image-guided cerebral arteriogram with staged embolization of her left ICA cavernous fistula/aneurysm with coiling and stenting 10/23/2018 with Dr. Estanislado Pandy. Patient has remained admitted to [facility] since procedure for observation. Since procedure, it appears that patient's left eye has worsened (worsening erythema/eccymosis, subjective diplopia on lateral gaze, pupil size 4 mm). Due to this finding, it was recommended that patient undergo staged embolization while she was still admitted. She underwent  an image-guided cerebral arteriogram with staged embolization of her left ICA  cavernous fistula/aneurysm with coiling 10/31/2018 with Dr. Estanislado Pandy. She was discharged home 11/02/2018 in stable condition.   She presented to [facility] for another image-guided cerebral arteriogram with staged embolization of right carotid cavernous sinus region filling via the intra cavernous channels from the left cavernous sinus using coils 12/05/2018 by Dr. Estanislado Pandy. She was discharged home 12/07/2018 in stable condition.  Patient presents today for follow-up regarding her recent procedure 12/05/2018. Patient awake and alert sitting in chair. Complains of left eye blurred vision/ptosis/diplopia, improved since discharge. Complains of left-sided pulsatile tinnitus, stable since discharge. Denies headache, weakness, numbness/tingling, dizziness, hearing changes, or speech difficulty.  Patient is currently taking Aspirin 81 mg once daily.   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---   Depression    GERD (gastroesophageal reflux disease)    12/04/2018- not since surgery for Hiatal Hernia   Head injury with loss of consciousness ([place]) 05/2018   Hiatal hernia    History of blood transfusion 2007   History of DVT of lower extremity    08-14-2003  RIGHT LOWER EXTREMITY-- TREATED W/ COUMADIN----  per pt none since   History of palpitations    denies   Hypertension    Idiopathic chronic gout    12-20-2017  per pt stable ,   last episode 02/ 2019   Iron deficiency anemia    Migraines    Personal history of PE (pulmonary embolism)    03/ 2005 RIGHT SIDE  --- treated w/ coumadin---  per pt none since   Pneumonia 2019   PONV (postoperative nausea and vomiting)    Tinea versicolor    Vitamin  D deficiency     Past Surgical History:  Procedure Laterality Date   BIOPSY  11/29/2017   Procedure: BIOPSY;   Surgeon: Clarene Essex, MD;  Location: [facility];  Service: Endoscopy;;   BRONCHOSCOPY  05-20-2004  dr clance   ESOPHAGOGASTRODUODENOSCOPY (EGD) WITH PROPOFOL N/A 11/29/2017   Procedure: ESOPHAGOGASTRODUODENOSCOPY (EGD) WITH PROPOFOL;  Surgeon: Clarene Essex, MD;  Location: [facility];  Service: Endoscopy;  Laterality: N/A;   EXPLORATORY LAPAROTOMY  AGE 61    [place]   for constipation   INSERTION OF MESH N/A 12/26/2017   Procedure: INSERTION OF BIO A MESH;  Surgeon: Ralene Ok, MD;  Location: [facility];  Service: General;  Laterality: N/A;   IR ANGIO INTRA EXTRACRAN SEL COM CAROTID INNOMINATE BILAT MOD SED  10/20/2018   IR ANGIO INTRA EXTRACRAN SEL COM CAROTID INNOMINATE UNI R MOD SED  10/31/2018   IR ANGIO INTRA EXTRACRAN SEL INTERNAL CAROTID UNI L MOD SED  10/23/2018   IR ANGIO INTRA EXTRACRAN SEL INTERNAL CAROTID UNI L MOD SED  10/31/2018   IR ANGIO INTRA EXTRACRAN SEL INTERNAL CAROTID UNI L MOD SED  12/05/2018   IR ANGIO VERTEBRAL SEL SUBCLAVIAN INNOMINATE UNI R MOD SED  10/20/2018   IR ANGIO VERTEBRAL SEL VERTEBRAL UNI L MOD SED  10/20/2018   IR ANGIOGRAM FOLLOW UP STUDY  10/23/2018   IR ANGIOGRAM FOLLOW UP STUDY  10/31/2018   IR ANGIOGRAM FOLLOW UP STUDY  12/05/2018   IR NEURO EACH ADD'L AFTER BASIC UNI LEFT (MS)  10/31/2018   IR TRANSCATH/EMBOLIZ  10/23/2018   IR TRANSCATH/EMBOLIZ  10/31/2018   IR TRANSCATH/EMBOLIZ  12/05/2018   IR VENO/JUGULAR RIGHT  12/05/2018   KNEE ARTHROSCOPY W/ ACL RECONSTRUCTION Right 09/2003   LAPAROSCOPY BILATERAL TUBAL FULGERATION AND ATTEMPTED THERMACHOICE ABLATION WITH UTERINE PERFORATION  07-17-2009   dr meisinger  [place]   right lower lobe for massive hemoptyosis   MINI-THORACOTOMY WEDGE RESECTION OF LEFT LOWER LOBE CYST Left 06-24-2004   dr Arlyce Dice [facility]   hemoptyosis   RADIOLOGY WITH ANESTHESIA N/A 10/23/2018   Procedure: IR WITH ANESTHESIA FOR EMBOLIZATION;  Surgeon: Luanne Bras, MD;  Location: [place];  Service: Radiology;   Laterality: N/A;   RADIOLOGY WITH ANESTHESIA N/A 10/31/2018   Procedure: EMBOLIZATION;  Surgeon: Luanne Bras, MD;  Location: [place];  Service: Radiology;  Laterality: N/A;   RADIOLOGY WITH ANESTHESIA N/A 12/05/2018   Procedure: EMBOLIZATION;  Surgeon: Luanne Bras, MD;  Location: [place];  Service: Radiology;  Laterality: N/A;    Allergies: Guaifenesin, Iron, Doxepin, Pseudoeph-doxylamine-dm-apap, Pseudoephedrine, Sulfa antibiotics, Hydrocodone-acetaminophen, Metoprolol succinate, Celexa [citalopram hydrobromide], Codeine, Hydromorphone hcl, Morphine, Pantoprazole sodium, Prednisone, and Tetracyclines & related  Medications: Prior to Admission medications   Medication Sig Start Date End Date Taking? Authorizing Provider  allopurinol (ZYLOPRIM) 100 MG tablet Take 1 tablet (100 mg total) by mouth daily. Must keep visit on 11/03/17 to get refills Patient taking differently: Take 100 mg by mouth 2 (two) times daily as needed (for gout flare up). Must keep visit on 11/03/17 to get refills 11/03/17   Plotnikov, Evie Lacks, MD  aspirin 81 MG chewable tablet Chew 1 tablet (81 mg total) by mouth daily. 11/02/18   Domenic Polite, MD  brimonidine (ALPHAGAN) 0.2 % ophthalmic solution Place 1 drop into the left eye 3 (three) times daily. Patient taking differently: Place 1 drop into the left eye 2 (two) times a day.  11/02/18   Domenic Polite, MD  Cholecalciferol (VITAMIN D3) 2000  units TABS Take 2,000 Units by mouth daily.     [provider]  diazepam (VALIUM) 5 MG tablet Take 1 tablet (5 mg total) by mouth every 12 (twelve) hours as needed for anxiety. Patient taking differently: Take 5 mg by mouth at bedtime.  11/22/18   Plotnikov, Evie Lacks, MD  ferrous sulfate 325 (65 FE) MG tablet Take 325 mg by mouth daily with breakfast.    [provider]  hydrochlorothiazide (HYDRODIURIL) 25 MG tablet Take 1 tablet (25 mg total) by mouth daily. 11/22/18   Plotnikov, Evie Lacks, MD  KLOR-CON  M20 20 MEQ tablet TAKE 1 TABLET BY MOUTH EVERY DAY Patient taking differently: Take 20 mEq by mouth daily.  11/13/18   Plotnikov, Evie Lacks, MD  latanoprost (XALATAN) 0.005 % ophthalmic solution Place 1 drop into the left eye at bedtime. 11/02/18   Domenic Polite, MD  Multiple Vitamin (MULTIVITAMIN WITH MINERALS) TABS tablet Take 1 tablet by mouth daily. One-A-Day Multivitamin    [provider]  polyvinyl alcohol (LIQUIFILM TEARS) 1.4 % ophthalmic solution Place 1 drop into the left eye 4 (four) times daily. Patient taking differently: Place 1 drop into the left eye 2 (two) times a day.  11/02/18   Domenic Polite, MD  promethazine (PHENERGAN) 12.5 MG tablet Take 1 tablet (12.5 mg total) by mouth every 8 (eight) hours as needed for nausea or vomiting. 12/20/18   Plotnikov, Evie Lacks, MD  timolol (TIMOPTIC) 0.5 % ophthalmic solution Place 1 drop into the left eye 2 (two) times daily. 11/02/18   Domenic Polite, MD  White Petrolatum-Mineral Oil (STYE) 31.9-57.7 % OINT Place 1 application into the left eye at bedtime.    [provider]     Family History  Problem Relation Age of Onset   Hypertension Other    Diabetes Other     Social History   Socioeconomic History   Marital status: Single    Spouse name: Not on file   Number of children: Not on file   Years of education: Not on file   Highest education level: Not on file  Occupational History   Not on file  Social Needs   Financial resource strain: Not on file   Food insecurity    Worry: Not on file    Inability: Not on file   Transportation needs    Medical: Not on file    Non-medical: Not on file  Tobacco Use   Smoking status: Never Smoker   Smokeless tobacco: Never Used  Substance and Sexual Activity   Alcohol use: Yes    Comment: rare, maybe 1 a year   Drug use: No   Sexual activity: Not on file    Comment: BTL  Lifestyle   Physical activity    Days per week: Not on file    Minutes per  session: Not on file   Stress: Not on file  Relationships   Social connections    Talks on phone: Not on file    Gets together: Not on file    Attends religious service: Not on file    Active member of club or organization: Not on file    Attends meetings of clubs or organizations: Not on file    Relationship status: Not on file  Other Topics Concern   Not on file  Social History Narrative   Not on file     Review of Systems: A 12 point ROS discussed and pertinent positives are indicated in the  HPI above.  All other systems are negative.  Review of Systems  Constitutional: Negative for chills and fever.  HENT: Positive for tinnitus. Negative for hearing loss.   Eyes: Positive for visual disturbance.  Respiratory: Negative for shortness of breath and wheezing.   Cardiovascular: Negative for chest pain and palpitations.  Neurological: Negative for dizziness, speech difficulty, weakness, numbness and headaches.  Psychiatric/Behavioral: Negative for behavioral problems and confusion.    Physical Exam Constitutional:      General: She is not in acute distress.    Appearance: Normal appearance.  Eyes:     Comments: Left pupil 3 mm sluggish, right pupil 2 mm and reactive; left eye with ptosis but able to fully open eye on command, no ptosis of right eye; left eye with complete right lateral gaze, subjective diplopia (side by side) of left eye seen with left lateral gaze and superior gaze but overall left eye EOMs have improved 50-75%, right EOMs intact without diplopia.  Pulmonary:     Effort: Pulmonary effort is normal. No respiratory distress.  Skin:    General: Skin is warm and dry.  Neurological:     Mental Status: She is alert and oriented to person, place, and time.  Psychiatric:        Mood and Affect: Mood normal.        Behavior: Behavior normal.        Thought Content: Thought content normal.        Judgment: Judgment normal.      Imaging: Ct Head Wo  Contrast  Result Date: 12/05/2018 CLINICAL DATA:  Acute headache following angiogram. Recent treatment of cavernous carotid fistula. EXAM: CT HEAD WITHOUT CONTRAST TECHNIQUE: Contiguous axial images were obtained from the base of the skull through the vertex without intravenous contrast. COMPARISON:  CTA head neck 10/26/2018, 10/20/2018 FINDINGS: Brain: The lower brainstem and much of the posterior fossa are obscured by streak artifact from coil material. There is periventricular hypoattenuation compatible with chronic microvascular disease. Volume loss is greater than expected for age. There is no acute intracranial hemorrhage. No midline shift or other mass effect. Vascular: Embolization material within the area of the cavernous sinus/cavernous carotid arteries and at the left orbital apex. Skull: The visualized skull base, calvarium and extracranial soft tissues are normal. Sinuses/Orbits: Moderate mucosal thickening of the left frontal sinus. No mastoid or middle ear effusion. Approximately 50% of the orbit is obscured on the left by streak artifact. No visible abnormality. The size of the superior orbital vein is normal. IMPRESSION: 1. Embolization material in the area of the cavernous sinuses and left orbital apex, with associated streak artifact obscuring portions of the left orbit and the posterior fossa. 2. No acute intracranial abnormality. 3. Volume loss greater than expected for age. Electronically Signed   By: Ulyses Jarred M.D.   On: 12/05/2018 14:01   Ir Transcath/emboliz  Result Date: 12/07/2018 CLINICAL DATA:  Complex left carotid cavernous direct fistula. Previous two-staged embolizations. Persistent pulsatile tinnitus with left orbital ophthalmoplegia. EXAM: TRANSCATHETER THERAPY EMBOLIZATION; RIGHT JUGULAR VENOGRAPHY; ARTERIOGRAPHY; IR ANGIO INTRA EXTRACRAN SEL INTERNAL CAROTID UNI LEFT MOD SED COMPARISON:  Diagnostic arteriogram of 10/31/2018. MEDICATIONS: Heparin 4,000 units IV; Ancef 2 g  IV antibiotic was administered within 1 hour of the procedure. ANESTHESIA/SEDATION: Mac anesthesia for the diagnostic portion followed by general anesthesia for the endovascular embolization. CONTRAST:  Isovue 300 approximately 110 mL. FLUOROSCOPY TIME:  Fluoroscopy Time: 36 minutes 30 seconds (1023 mGy). COMPLICATIONS: None immediate. TECHNIQUE: Informed written consent  was obtained from the patient after a thorough discussion of the procedural risks, benefits and alternatives. All questions were addressed. Maximal Sterile Barrier Technique was utilized including caps, mask, sterile gowns, sterile gloves, sterile drape, hand hygiene and skin antiseptic. A timeout was performed prior to the initiation of the procedure. The left groin was prepped and draped in the usual sterile fashion. Thereafter using modified Seldinger technique, transfemoral access into the left common femoral artery was obtained without difficulty. Over a 0.035 inch guidewire, a 5 French Pinnacle sheath was inserted. Through this, and also over 0.035 inch guidewire, a 5 [place] JB 1 catheter was advanced to the aortic arch region and selectively positioned in the left common carotid artery. The right groin was also prepped and draped in the usual sterile fashion. Transfemoral access into the right common femoral vein was obtained using modified Simmons technique with a micropuncture set. Over a 0.035 inch J tip guidewire, a 5 French sheath was positioned and connected to continuous heparinized saline infusion. Through this, and also over a 0.035 inch Roadrunner guidewire, a 5 [place] Envoy 95 cm guide catheter was advanced into the right internal jugular vein at the cranial skull base. The guidewire was removed. Good aspiration was obtained from the hub of the 5 [place] Envoy guide catheter. A control venogram was then performed which demonstrated safe position the tip of the 5 [place] guide catheter with retrograde opacification into the right  internal jugular bulb region and the opacification of the right inferior petrosal sinus. A venogram was then performed through the 5 [place] guide catheter in the inferior petrosal sinus just proximal to the right cavernous sinus. FINDINGS: The left common carotid arteriogram demonstrates the left external carotid artery and its major branches to be widely patent. The left internal carotid artery at the bulb to the cranial skull base demonstrates wide patency. 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 More distally the petrous segment and the petrous cavernous junction demonstrate wide patency. Just distal to this again seen is a mass or coils in the left cavernous sinus. Through this, there is patency of the internal carotid artery proximal cavernous segment with immediate shunting of contrast into the contralateral right cavernous sinus via the intra cavernous communications. This demonstrates brisk opacification of the inferior petrosal sinus and the petroclival veins. Also demonstrated is delayed opacification of the right superior ophthalmic vein. More distally there is opacification of the left internal carotid artery just distal to the origin of the ophthalmic artery, the left supraclinoid left ICA, and the left middle cerebral artery and the left anterior cerebral artery with mixing of unopacified blood from the contralateral right internal carotid artery via the anterior communicating artery as noted previously. Venogram obtained intracranially with the tip of the 5 [place] guide catheter in the inferior petrosal sinus demonstrates again opacification of the left cavernous sinus with almost immediate washing out from the blood coming from the cavernous sinus into the right inferior petrosal sinus and subsequently  the right internal jugular vein. TRANSVENOUS EMBOLIZATION OF THE RIGHT CAVERNOUS SINUS Through the 5 [place] Envoy guide catheter, using biplane roadmap technique and constant fluoroscopic guidance, in a coaxial manner and with constant heparinized saline infusion, an Echelon 14 2 tip microcatheter was advanced over a 0.014 inch Softip Transend EX micro guidewire to  the distal end of the 5 [place] guide catheter. Using a torque device, the micro guidewire was advanced into the rostral portion of the right cavernous sinus followed by microcatheter. The micro guidewire was then manipulated with a torque device to seek injury into the intra cavernous venous channels with significant resistance. The microcatheter was subsequently left in the superolateral aspect of the cavernous sinus with the micro guidewire removed. Good aspiration was obtained from the hub of the microcatheter. A gentle control arteriogram performed through the microcatheter demonstrated safe position of the tip of the microcatheter. This was then connected to continuous heparinized saline infusion. Subsequent to this, transvenous embolization was performed of the right cavernous sinus using first a 7 mm x 30 cm 3D EV3 Axium coil, followed by a 10 mm x 30 cm 3D EV3 Axium coil, an 8 mm x 30 cm 3D EV3 Axium coil, a 7 mm x 30 cm 3D EV3 Axium coil, an 8 mm x 30 cm 3D EV3 Axium coil, a 5 mm x 20 cm 3D EV3 Axium coil, a 7 mm x 30 cm 3D EV3 Axium coil, a 7 mm x 20 cm 3D EV3 Axium coil, and finally 7 mm x 20 cm 3D EV3 coil. A control arteriogram was then performed through the left internal carotid artery injection with extension into the venous phase. This continued to demonstrate transvenous shunting from the left to the right cavernous sinus albeit with significant delayed exit time. There continued to be opacification of the superior ophthalmic vein with subsequent retrograde opacification into the right angular branch of the facial vein. No angiographic  evidence of retrograde cortical reflux was noted intra cranially. Attempts were again made to advance the micro guidewire and microcatheter into the inner cavernous channels without success. The procedure was, therefore, stopped. The microcatheter, and the 5 [place] Envoy guide sheath were then removed. A final control arteriogram performed through the left common carotid artery again demonstrated appreciable slowing of shunting from the left cavernous sinus to the right cavernous sinus without evidence of cortical reflux. Egress into the right superior ophthalmic vein and subsequently the right annular branch of the right facial vein was noted. The diagnostic 5 [place] JB 1 catheter was then retrieved and removed. The left groin sheath was then removed with successful application of a 5 [place] ExoSeal closure device with hemostasis. The 5 French groin sheath was also removed from the right common femoral vein with hemostasis achieved with manual pressure. Distal pulses in the dorsalis pedis, and the posterior tibial regions remained palpable unchanged. Throughout the procedure, the patient's hemodynamic status, and neurologic status remained stable. No evidence of angiographic extravasation was seen. The patient's ACT was maintained in the region of approximately 250 seconds which was partially reversed with 10 mg of protamine sulfate. The patient's general anesthesia was then reversed, and the patient was then extubated without difficulty. Upon recovery, the patient demonstrated no evidence of proptosis on the right side or left side. She denied any immediate headaches. She did have moderate nausea which slowly responded to intermittent doses of Zofran, Phenergan, and finally to Reglan IV. Patient was then transferred to the neuro PACU and then neuro ICU in to continue with close neurologic observations and control of her blood pressure. Patient did develop bilateral femoral region bleeding once in the PACU which  required manual compression with resolution. Ultrasound of the left groin demonstrated a moderate sized hematoma. This was then treated conservatively with significant improvement the following day. Her overnight  stay remained stable with resolution of her headache. Neurologically the patient also continued to be stable without headaches, nausea, vomiting, or of worsening visual changes. The following morning the patient was sat up. Her groins looked stable as did her pulses. Furthermore the patient was awake, alert, oriented to time, place, space with no change in the ophthalmoplegia in the left eyeball without evidence of proptosis or chemosis. Her left pupil remained 3 mm very sluggish to direct light stimulation while the right remained to with reaction to light. She was started on 1 baby aspirin a day. 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 IMPRESSION: Status post transvenous embolization of the right carotid cavernous sinus region filling via the intra cavernous channels from the left cavernous sinus via the left internal carotid artery as described. PLAN: Follow-up in the clinic in approximately 2-3 weeks with probable repeat MRI MRA of the brain in approximately 3-4 months. Electronically Signed   By: Luanne Bras M.D.   On: 12/06/2018 12:58   Ir Angiogram Follow Up Study  Result Date: 12/07/2018 CLINICAL DATA:  Complex left carotid cavernous direct fistula. Previous two-staged embolizations. Persistent pulsatile tinnitus with left orbital ophthalmoplegia. EXAM: TRANSCATHETER THERAPY EMBOLIZATION; RIGHT JUGULAR VENOGRAPHY; ARTERIOGRAPHY; IR ANGIO INTRA EXTRACRAN SEL INTERNAL CAROTID UNI LEFT MOD SED COMPARISON:  Diagnostic arteriogram of 10/31/2018. MEDICATIONS: Heparin 4,000 units IV; Ancef 2 g IV antibiotic was administered within 1 hour of the procedure. ANESTHESIA/SEDATION: Mac anesthesia for the diagnostic portion followed by general  anesthesia for the endovascular embolization. CONTRAST:  Isovue 300 approximately 110 mL. FLUOROSCOPY TIME:  Fluoroscopy Time: 36 minutes 30 seconds (1023 mGy). COMPLICATIONS: None immediate. TECHNIQUE: Informed written consent was obtained from the patient after a thorough discussion of the procedural risks, benefits and alternatives. All questions were addressed. Maximal Sterile Barrier Technique was utilized including caps, mask, sterile gowns, sterile gloves, sterile drape, hand hygiene and skin antiseptic. A timeout was performed prior to the initiation of the procedure. The left groin was prepped and draped in the usual sterile fashion. Thereafter using modified Seldinger technique, transfemoral access into the left common femoral artery was obtained without difficulty. Over a 0.035 inch guidewire, a 5 French Pinnacle sheath was inserted. Through this, and also over 0.035 inch guidewire, a 5 [place] JB 1 catheter was advanced to the aortic arch region and selectively positioned in the left common carotid artery. The right groin was also prepped and draped in the usual sterile fashion. Transfemoral access into the right common femoral vein was obtained using modified Simmons technique with a micropuncture set. Over a 0.035 inch J tip guidewire, a 5 French sheath was positioned and connected to continuous heparinized saline infusion. Through this, and also over a 0.035 inch Roadrunner guidewire, a 5 [place] Envoy 95 cm guide catheter was advanced into the right internal jugular vein at the cranial skull base. The guidewire was removed. Good aspiration was obtained from the hub of the 5 [place] Envoy guide catheter. A control venogram was then performed which demonstrated safe position the tip of the 5 [place] guide catheter with retrograde opacification into the right internal jugular bulb region and the opacification of the right inferior petrosal sinus. A venogram was then performed through the 5 [place] guide  catheter in the inferior petrosal sinus just proximal to the right cavernous sinus. FINDINGS: The left common carotid arteriogram demonstrates the left external carotid artery and its major branches to be widely patent. The left internal carotid  artery at the bulb to the cranial skull base demonstrates wide patency. 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 More distally the petrous segment and the petrous cavernous junction demonstrate wide patency. Just distal to this again seen is a mass or coils in the left cavernous sinus. Through this, there is patency of the internal carotid artery proximal cavernous segment with immediate shunting of contrast into the contralateral right cavernous sinus via the intra cavernous communications. This demonstrates brisk opacification of the inferior petrosal sinus and the petroclival veins. Also demonstrated is delayed opacification of the right superior ophthalmic vein. More distally there is opacification of the left internal carotid artery just distal to the origin of the ophthalmic artery, the left supraclinoid left ICA, and the left middle cerebral artery and the left anterior cerebral artery with mixing of unopacified blood from the contralateral right internal carotid artery via the anterior communicating artery as noted previously. Venogram obtained intracranially with the tip of the 5 [place] guide catheter in the inferior petrosal sinus demonstrates again opacification of the left cavernous sinus with almost immediate washing out from the blood coming from the cavernous sinus into the right inferior petrosal sinus and subsequently the right internal jugular vein. TRANSVENOUS EMBOLIZATION OF THE RIGHT CAVERNOUS SINUS Through the 5 [place] Envoy guide catheter, using biplane  roadmap technique and constant fluoroscopic guidance, in a coaxial manner and with constant heparinized saline infusion, an Echelon 14 2 tip microcatheter was advanced over a 0.014 inch Softip Transend EX micro guidewire to the distal end of the 5 [place] guide catheter. Using a torque device, the micro guidewire was advanced into the rostral portion of the right cavernous sinus followed by microcatheter. The micro guidewire was then manipulated with a torque device to seek injury into the intra cavernous venous channels with significant resistance. The microcatheter was subsequently left in the superolateral aspect of the cavernous sinus with the micro guidewire removed. Good aspiration was obtained from the hub of the microcatheter. A gentle control arteriogram performed through the microcatheter demonstrated safe position of the tip of the microcatheter. This was then connected to continuous heparinized saline infusion. Subsequent to this, transvenous embolization was performed of the right cavernous sinus using first a 7 mm x 30 cm 3D EV3 Axium coil, followed by a 10 mm x 30 cm 3D EV3 Axium coil, an 8 mm x 30 cm 3D EV3 Axium coil, a 7 mm x 30 cm 3D EV3 Axium coil, an 8 mm x 30 cm 3D EV3 Axium coil, a 5 mm x 20 cm 3D EV3 Axium coil, a 7 mm x 30 cm 3D EV3 Axium coil, a 7 mm x 20 cm 3D EV3 Axium coil, and finally 7 mm x 20 cm 3D EV3 coil. A control arteriogram was then performed through the left internal carotid artery injection with extension into the venous phase. This continued to demonstrate transvenous shunting from the left to the right cavernous sinus albeit with significant delayed exit time. There continued to be opacification of the superior ophthalmic vein with subsequent retrograde opacification into the right angular branch of the facial vein. No angiographic evidence of retrograde cortical reflux was noted intra cranially. Attempts were again made to advance the micro guidewire and microcatheter into  the inner cavernous channels without success. The procedure was,  therefore, stopped. The microcatheter, and the 5 [place] Envoy guide sheath were then removed. A final control arteriogram performed through the left common carotid artery again demonstrated appreciable slowing of shunting from the left cavernous sinus to the right cavernous sinus without evidence of cortical reflux. Egress into the right superior ophthalmic vein and subsequently the right annular branch of the right facial vein was noted. The diagnostic 5 [place] JB 1 catheter was then retrieved and removed. The left groin sheath was then removed with successful application of a 5 [place] ExoSeal closure device with hemostasis. The 5 French groin sheath was also removed from the right common femoral vein with hemostasis achieved with manual pressure. Distal pulses in the dorsalis pedis, and the posterior tibial regions remained palpable unchanged. Throughout the procedure, the patient's hemodynamic status, and neurologic status remained stable. No evidence of angiographic extravasation was seen. The patient's ACT was maintained in the region of approximately 250 seconds which was partially reversed with 10 mg of protamine sulfate. The patient's general anesthesia was then reversed, and the patient was then extubated without difficulty. Upon recovery, the patient demonstrated no evidence of proptosis on the right side or left side. She denied any immediate headaches. She did have moderate nausea which slowly responded to intermittent doses of Zofran, Phenergan, and finally to Reglan IV. Patient was then transferred to the neuro PACU and then neuro ICU in to continue with close neurologic observations and control of her blood pressure. Patient did develop bilateral femoral region bleeding once in the PACU which required manual compression with resolution. Ultrasound of the left groin demonstrated a moderate sized hematoma. This was then treated  conservatively with significant improvement the following day. Her overnight stay remained stable with resolution of her headache. Neurologically the patient also continued to be stable without headaches, nausea, vomiting, or of worsening visual changes. The following morning the patient was sat up. Her groins looked stable as did her pulses. Furthermore the patient was awake, alert, oriented to time, place, space with no change in the ophthalmoplegia in the left eyeball without evidence of proptosis or chemosis. Her left pupil remained 3 mm very sluggish to direct light stimulation while the right remained to with reaction to light. She was started on 1 baby aspirin a day. 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 IMPRESSION: Status post transvenous embolization of the right carotid cavernous sinus region filling via the intra cavernous channels from the left cavernous sinus via the left internal carotid artery as described. PLAN: Follow-up in the clinic in approximately 2-3 weeks with probable repeat MRI MRA of the brain in approximately 3-4 months. Electronically Signed   By: Luanne Bras M.D.   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 Persistent pulsatile tinnitus with left orbital ophthalmoplegia. EXAM: TRANSCATHETER THERAPY EMBOLIZATION; RIGHT JUGULAR VENOGRAPHY; ARTERIOGRAPHY; IR ANGIO INTRA EXTRACRAN SEL INTERNAL CAROTID UNI LEFT MOD SED COMPARISON:  Diagnostic arteriogram of 10/31/2018. MEDICATIONS: Heparin 4,000 units IV; Ancef 2 g IV antibiotic was administered within 1 hour of the procedure. ANESTHESIA/SEDATION: Mac anesthesia for the diagnostic portion followed by general anesthesia for the endovascular embolization. CONTRAST:  Isovue 300 approximately 110 mL. FLUOROSCOPY TIME:  Fluoroscopy Time: 36 minutes 30  seconds (1023 mGy). COMPLICATIONS: None immediate. TECHNIQUE: Informed written consent was obtained from the patient after a thorough discussion of the procedural risks, benefits and alternatives.  All questions were addressed. Maximal Sterile Barrier Technique was utilized including caps, mask, sterile gowns, sterile gloves, sterile drape, hand hygiene and skin antiseptic. A timeout was performed prior to the initiation of the procedure. The left groin was prepped and draped in the usual sterile fashion. Thereafter using modified Seldinger technique, transfemoral access into the left common femoral artery was obtained without difficulty. Over a 0.035 inch guidewire, a 5 French Pinnacle sheath was inserted. Through this, and also over 0.035 inch guidewire, a 5 [place] JB 1 catheter was advanced to the aortic arch region and selectively positioned in the left common carotid artery. The right groin was also prepped and draped in the usual sterile fashion. Transfemoral access into the right common femoral vein was obtained using modified Simmons technique with a micropuncture set. Over a 0.035 inch J tip guidewire, a 5 French sheath was positioned and connected to continuous heparinized saline infusion. Through this, and also over a 0.035 inch Roadrunner guidewire, a 5 [place] Envoy 95 cm guide catheter was advanced into the right internal jugular vein at the cranial skull base. The guidewire was removed. Good aspiration was obtained from the hub of the 5 [place] Envoy guide catheter. A control venogram was then performed which demonstrated safe position the tip of the 5 [place] guide catheter with retrograde opacification into the right internal jugular bulb region and the opacification of the right inferior petrosal sinus. A venogram was then performed through the 5 [place] guide catheter in the inferior petrosal sinus just proximal to the right cavernous sinus. FINDINGS: The left common carotid arteriogram demonstrates  the left external carotid artery and its major branches to be widely patent. The left internal carotid artery at the bulb to the cranial skull base demonstrates wide patency. 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 More distally the petrous segment and the petrous cavernous junction demonstrate wide patency. Just distal to this again seen is a mass or coils in the left cavernous sinus. Through this, there is patency of the internal carotid artery proximal cavernous segment with immediate shunting of contrast into the contralateral right cavernous sinus via the intra cavernous communications. This demonstrates brisk opacification of the inferior petrosal sinus and the petroclival veins. Also demonstrated is delayed opacification of the right superior ophthalmic vein. More distally there is opacification of the left internal carotid artery just distal to the origin of the ophthalmic artery, the left supraclinoid left ICA, and the left middle cerebral artery and the left anterior cerebral artery with mixing of unopacified blood from the contralateral right internal carotid artery via the anterior communicating artery as noted previously. Venogram obtained intracranially with the tip of the 5 [place] guide catheter in the inferior petrosal sinus demonstrates again opacification of the left cavernous sinus with almost immediate washing out from the blood coming from the cavernous sinus into the right inferior petrosal sinus and subsequently the right internal jugular vein. TRANSVENOUS EMBOLIZATION OF THE RIGHT CAVERNOUS SINUS Through the 5 [place] Envoy guide catheter, using biplane roadmap technique and constant fluoroscopic guidance, in a coaxial manner and with constant heparinized saline infusion, an Echelon 14 2 tip  microcatheter was advanced over a 0.014 inch Softip Transend EX micro guidewire to the distal end of the 5 [place] guide catheter. Using a torque device, the micro guidewire  was advanced into the rostral portion of the right cavernous sinus followed by microcatheter. The micro guidewire was then manipulated with a torque device to seek injury into the intra cavernous venous channels with significant resistance. The microcatheter was subsequently left in the superolateral aspect of the cavernous sinus with the micro guidewire removed. Good aspiration was obtained from the hub of the microcatheter. A gentle control arteriogram performed through the microcatheter demonstrated safe position of the tip of the microcatheter. This was then connected to continuous heparinized saline infusion. Subsequent to this, transvenous embolization was performed of the right cavernous sinus using first a 7 mm x 30 cm 3D EV3 Axium coil, followed by a 10 mm x 30 cm 3D EV3 Axium coil, an 8 mm x 30 cm 3D EV3 Axium coil, a 7 mm x 30 cm 3D EV3 Axium coil, an 8 mm x 30 cm 3D EV3 Axium coil, a 5 mm x 20 cm 3D EV3 Axium coil, a 7 mm x 30 cm 3D EV3 Axium coil, a 7 mm x 20 cm 3D EV3 Axium coil, and finally 7 mm x 20 cm 3D EV3 coil. A control arteriogram was then performed through the left internal carotid artery injection with extension into the venous phase. This continued to demonstrate transvenous shunting from the left to the right cavernous sinus albeit with significant delayed exit time. There continued to be opacification of the superior ophthalmic vein with subsequent retrograde opacification into the right angular branch of the facial vein. No angiographic evidence of retrograde cortical reflux was noted intra cranially. Attempts were again made to advance the micro guidewire and microcatheter into the inner cavernous channels without success. The procedure was, therefore, stopped. The microcatheter, and the 5 [place] Envoy guide sheath  were then removed. A final control arteriogram performed through the left common carotid artery again demonstrated appreciable slowing of shunting from the left cavernous sinus to the right cavernous sinus without evidence of cortical reflux. Egress into the right superior ophthalmic vein and subsequently the right annular branch of the right facial vein was noted. The diagnostic 5 [place] JB 1 catheter was then retrieved and removed. The left groin sheath was then removed with successful application of a 5 [place] ExoSeal closure device with hemostasis. The 5 French groin sheath was also removed from the right common femoral vein with hemostasis achieved with manual pressure. Distal pulses in the dorsalis pedis, and the posterior tibial regions remained palpable unchanged. Throughout the procedure, the patient's hemodynamic status, and neurologic status remained stable. No evidence of angiographic extravasation was seen. The patient's ACT was maintained in the region of approximately 250 seconds which was partially reversed with 10 mg of protamine sulfate. The patient's general anesthesia was then reversed, and the patient was then extubated without difficulty. Upon recovery, the patient demonstrated no evidence of proptosis on the right side or left side. She denied any immediate headaches. She did have moderate nausea which slowly responded to intermittent doses of Zofran, Phenergan, and finally to Reglan IV. Patient was then transferred to the neuro PACU and then neuro ICU in to continue with close neurologic observations and control of her blood pressure. Patient did develop bilateral femoral region bleeding once in the PACU which required manual compression with resolution. Ultrasound of the left groin demonstrated a moderate sized hematoma. This was then treated conservatively with significant improvement the following day. Her overnight stay remained stable with resolution of her headache. Neurologically the  patient also continued to be stable  without headaches, nausea, vomiting, or of worsening visual changes. The following morning the patient was sat up. Her groins looked stable as did her pulses. Furthermore the patient was awake, alert, oriented to time, place, space with no change in the ophthalmoplegia in the left eyeball without evidence of proptosis or chemosis. Her left pupil remained 3 mm very sluggish to direct light stimulation while the right remained to with reaction to light. She was started on 1 baby aspirin a day. 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 IMPRESSION: Status post transvenous embolization of the right carotid cavernous sinus region filling via the intra cavernous channels from the left cavernous sinus via the left internal carotid artery as described. PLAN: Follow-up in the clinic in approximately 2-3 weeks with probable repeat MRI MRA of the brain in approximately 3-4 months. Electronically Signed   By: Luanne Bras M.D.   On: 12/06/2018 12:58   Vas [place] Lower Extremity Arterial Duplex  Result Date: 12/05/2018 LOWER EXTREMITY ARTERIAL DUPLEX STUDY Indications: S/P endovascular left ICA cavernous fistula embolization, left              arterial stick and right venous stick.  Current ABI: N/A  Examination Guidelines: A complete evaluation includes B-mode imaging, spectral Doppler, color Doppler, and power Doppler as needed of all accessible portions of each vessel. Bilateral testing is considered an integral part of a complete examination. Limited examinations for reoccurring indications may be performed as noted.  +--------+--------+-----+--------+---------+--------+  RIGHT    PSV cm/s Ratio Stenosis Waveform  Comments  +--------+--------+-----+--------+---------+--------+  CFA Prox                         triphasic           +--------+--------+-----+--------+---------+--------+  SFA Prox                         triphasic            +--------+--------+-----+--------+---------+--------+ Venous comments: Patent CFV.  +--------+--------+-----+--------+---------+--------+  LEFT     PSV cm/s Ratio Stenosis Waveform  Comments  +--------+--------+-----+--------+---------+--------+  CFA Prox                         triphasic           +--------+--------+-----+--------+---------+--------+  SFA Prox                         triphasic           +--------+--------+-----+--------+---------+--------+ Venous comments: Patent CFV.  Summary: Right: No evidence of pseudoaneurysm, AVF, DVT or hematoma. Left: No evidence of pseudoaneurysm, AVF or DVT. Heterogenous areas noted in the mid to lateral groin- appearance of hematoma.  See table(s) above for measurements and observations. Electronically signed by Curt Jews MD on 12/05/2018 at 5:08:42 PM.    Final    Ir Angio Intra Extracran Sel Internal Carotid Uni L Mod Sed  Result Date: 12/07/2018 CLINICAL DATA:  Complex left carotid cavernous direct fistula. Previous two-staged embolizations. Persistent pulsatile tinnitus with left orbital ophthalmoplegia. EXAM: TRANSCATHETER THERAPY EMBOLIZATION; RIGHT JUGULAR VENOGRAPHY; ARTERIOGRAPHY; IR ANGIO INTRA EXTRACRAN SEL INTERNAL CAROTID UNI LEFT MOD SED COMPARISON:  Diagnostic arteriogram of 10/31/2018. MEDICATIONS: Heparin 4,000 units IV; Ancef 2 g IV antibiotic was administered within 1 hour of the procedure. ANESTHESIA/SEDATION: Mac anesthesia for the diagnostic portion followed by general  anesthesia for the endovascular embolization. CONTRAST:  Isovue 300 approximately 110 mL. FLUOROSCOPY TIME:  Fluoroscopy Time: 36 minutes 30 seconds (1023 mGy). COMPLICATIONS: None immediate. TECHNIQUE: Informed written consent was obtained from the patient after a thorough discussion of the procedural risks, benefits and alternatives. All questions were addressed. Maximal Sterile Barrier Technique was utilized including caps, mask, sterile gowns, sterile gloves, sterile  drape, hand hygiene and skin antiseptic. A timeout was performed prior to the initiation of the procedure. The left groin was prepped and draped in the usual sterile fashion. Thereafter using modified Seldinger technique, transfemoral access into the left common femoral artery was obtained without difficulty. Over a 0.035 inch guidewire, a 5 [place] Pinnacle sheath was inserted. Through this, and also over 0.035 inch guidewire, a 5 [place] JB 1 catheter was advanced to the aortic arch region and selectively positioned in the left common carotid artery. The right groin was also prepped and draped in the usual sterile fashion. Transfemoral access into the right common femoral vein was obtained using modified Simmons technique with a micropuncture set. Over a 0.035 inch J tip guidewire, a 5 [place] sheath was positioned and connected to continuous heparinized saline infusion. Through this, and also over a 0.035 inch Roadrunner guidewire, a 5 [place] Envoy 95 cm guide catheter was advanced into the right internal jugular vein at the cranial skull base. The guidewire was removed. Good aspiration was obtained from the hub of the 5 [place] Envoy guide catheter. A control venogram was then performed which demonstrated safe position the tip of the 5 [place] guide catheter with retrograde opacification into the right internal jugular bulb region and the opacification of the right inferior petrosal sinus. A venogram was then performed through the 5 [place] guide catheter in the inferior petrosal sinus just proximal to the right cavernous sinus. FINDINGS: The left common carotid arteriogram demonstrates the left external carotid artery and its major branches to be widely patent. The left internal carotid artery at the bulb to the cranial skull base demonstrates wide patency. 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 More proximally there is a 5.3 mm x  4 mm aneurysm involving the proximal left internal carotid artery unchanged. More distally the petrous segment and the petrous cavernous junction demonstrate wide patency. Just distal to this again seen is a mass or coils in the left cavernous sinus. Through this, there is patency of the internal carotid artery proximal cavernous segment with immediate shunting of contrast into the contralateral right cavernous sinus via the intra cavernous communications. This demonstrates brisk opacification of the inferior petrosal sinus and the petroclival veins. Also demonstrated is delayed opacification of the right superior ophthalmic vein. More distally there is opacification of the left internal carotid artery just distal to the origin of the ophthalmic artery, the left supraclinoid left ICA, and the left middle cerebral artery and the left anterior cerebral artery with mixing of unopacified blood from the contralateral right internal carotid artery via the anterior communicating artery as noted previously. Venogram obtained intracranially with the tip of the 5 [place] guide catheter in the inferior petrosal sinus demonstrates again opacification of the left cavernous sinus with almost immediate washing out from the blood coming from the cavernous sinus into the right inferior petrosal sinus and subsequently the right internal jugular vein. TRANSVENOUS EMBOLIZATION OF THE RIGHT CAVERNOUS SINUS Through the 5 [place] Envoy guide catheter, using biplane roadmap technique and constant  fluoroscopic guidance, in a coaxial manner and with constant heparinized saline infusion, an Echelon 14 2 tip microcatheter was advanced over a 0.014 inch Softip Transend EX micro guidewire to the distal end of the 5 [place] guide catheter. Using a torque device, the micro guidewire was advanced into the rostral portion of the right cavernous sinus followed by microcatheter. The micro guidewire was then manipulated with a torque device to seek injury  into the intra cavernous venous channels with significant resistance. The microcatheter was subsequently left in the superolateral aspect of the cavernous sinus with the micro guidewire removed. Good aspiration was obtained from the hub of the microcatheter. A gentle control arteriogram performed through the microcatheter demonstrated safe position of the tip of the microcatheter. This was then connected to continuous heparinized saline infusion. Subsequent to this, transvenous embolization was performed of the right cavernous sinus using first a 7 mm x 30 cm 3D EV3 Axium coil, followed by a 10 mm x 30 cm 3D EV3 Axium coil, an 8 mm x 30 cm 3D EV3 Axium coil, a 7 mm x 30 cm 3D EV3 Axium coil, an 8 mm x 30 cm 3D EV3 Axium coil, a 5 mm x 20 cm 3D EV3 Axium coil, a 7 mm x 30 cm 3D EV3 Axium coil, a 7 mm x 20 cm 3D EV3 Axium coil, and finally 7 mm x 20 cm 3D EV3 coil. A control arteriogram was then performed through the left internal carotid artery injection with extension into the venous phase. This continued to demonstrate transvenous shunting from the left to the right cavernous sinus albeit with significant delayed exit time. There continued to be opacification of the superior ophthalmic vein with subsequent retrograde opacification into the right angular branch of the facial vein. No angiographic evidence of retrograde cortical reflux was noted intra cranially. Attempts were again made to advance the micro guidewire and microcatheter into the inner cavernous channels without success. The procedure was, therefore, stopped. The microcatheter, and the 5 [place] Envoy guide sheath were then removed. A final control arteriogram performed through the left common carotid artery again demonstrated appreciable slowing of shunting from the left cavernous sinus to the right cavernous sinus without evidence of cortical reflux. Egress into the right superior ophthalmic vein and subsequently the right annular branch of the right  facial vein was noted. The diagnostic 5 [place] JB 1 catheter was then retrieved and removed. The left groin sheath was then removed with successful application of a 5 [place] ExoSeal closure device with hemostasis. The 5 French groin sheath was also removed from the right common femoral vein with hemostasis achieved with manual pressure. Distal pulses in the dorsalis pedis, and the posterior tibial regions remained palpable unchanged. Throughout the procedure, the patient's hemodynamic status, and neurologic status remained stable. No evidence of angiographic extravasation was seen. The patient's ACT was maintained in the region of approximately 250 seconds which was partially reversed with 10 mg of protamine sulfate. The patient's general anesthesia was then reversed, and the patient was then extubated without difficulty. Upon recovery, the patient demonstrated no evidence of proptosis on the right side or left side. She denied any immediate headaches. She did have moderate nausea which slowly responded to intermittent doses of Zofran, Phenergan, and finally to Reglan IV. Patient was then transferred to the neuro PACU and then neuro ICU in to continue with close neurologic observations and control of her blood pressure. Patient did develop bilateral femoral region bleeding once in the  PACU which required manual compression with resolution. Ultrasound of the left groin demonstrated a moderate sized hematoma. This was then treated conservatively with significant improvement the following day. Her overnight stay remained stable with resolution of her headache. Neurologically the patient also continued to be stable without headaches, nausea, vomiting, or of worsening visual changes. The following morning the patient was sat up. Her groins looked stable as did her pulses. Furthermore the patient was awake, alert, oriented to time, place, space with no change in the ophthalmoplegia in the left eyeball without evidence of  proptosis or chemosis. Her left pupil remained 3 mm very sluggish to direct light stimulation while the right remained to with reaction to light. She was started on 1 baby aspirin a day. 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 IMPRESSION: Status post transvenous embolization of the right carotid cavernous sinus region filling via the intra cavernous channels from the left cavernous sinus via the left internal carotid artery as described. PLAN: Follow-up in the clinic in approximately 2-3 weeks with probable repeat MRI MRA of the brain in approximately 3-4 months. Electronically Signed   By: Luanne Bras M.D.   On: 12/06/2018 12:58    Labs:  CBC: Recent Labs    11/02/18 0345 12/05/18 0740 12/05/18 1319 12/05/18 1321 12/05/18 1651 12/06/18 0435  WBC 12.5* 11.9* 11.6*  --   --  16.3*  HGB 9.7* 13.4 8.5* 8.2* 8.5* 8.3*  HCT 29.6* 41.0 25.7* 24.0* 25.0* 25.2*  PLT 310 321 228  --   --  248    COAGS: Recent Labs    10/22/18 0923 10/29/18 0726 10/31/18 0530 12/05/18 0740 12/05/18 1319  INR 1.0 1.1 1.1 1.0  --   APTT  --   --   --  32 >200*    BMP: Recent Labs    10/31/18 0530 11/01/18 0443 12/05/18 0740 12/05/18 1321 12/05/18 1651 12/06/18 0435  NA 139 139 139 143 144 139  K 3.7 3.3* 3.7 3.3* 3.1* 3.4*  CL 100 107 105  --  112* 112*  CO2 _0 --   --  20*  GLUCOSE 98 86 86 148* 128* 107*  BUN _1 --  9 8  CALCIUM 9.8 8.4* 9.8  --   --  8.6*  CREATININE 0.87 0.90 0.75  --  0.40* 0.67  GFRNONAA >60 >60 >60  --   --  >60  GFRAA >60 >60 >60  --   --  >60    LIVER FUNCTION TESTS: Recent Labs    07/11/18 1300 10/19/18 1837 10/26/18 1129 10/29/18 0726  BILITOT 1.5* 1.4* 1.4* 1.1  AST 45* 25 35 14*  ALT 13 12 32 18  ALKPHOS 49 57 49 47  PROT 7.6 8.6* 7.6 6.6  ALBUMIN 4.3 5.2* 4.1 3.6     Assessment and Plan:  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 Estanislado Pandy, s/p staged embolization using coiling 10/31/2018 by Dr. Estanislado Pandy, s/p staged embolization of right carotid cavernous sinus region filling via the intra cavernous channels from the left cavernous sinus using coils 12/05/2018 by Dr. Estanislado Pandy. Dr. Estanislado Pandy was present for consultation.  Discussed patient's recent procedure. Patient states that her vision and ptosis have improved since procedure. States that she followed-up with her ophthalmologist who is "hopeful" that her vision/ptosis/EOMs will go back to normal. Recommended that patient follow-up with her ophthalmologist regularly. Regarding  her fistula, recommend repeat imaging in 4-6 weeks to assess if additional staged embolization is indicated.  Discussed patient's weight. Patient states that she is trying to gain weight. Instructed patient to eat 3 meals a day and to eat lots of protein (meats) and dairy.  Plan for follow-up with a MRI/MRA brain/head (without contrast) in 4-6 weeks. Informed patient that our schedulers will call her to set up this imaging scan. Instructed patient to continue taking Aspirin 81 mg once daily.  All questions answered and concerns addressed. Patient conveys understanding and agrees with plan.  Thank you for this interesting consult.  I greatly enjoyed meeting [name] and look forward to participating in their care.  A copy of this report was sent to the requesting provider on this date.  Electronically Signed: Earley Abide, PA-C 01/02/2019, 9:43 AM   I spent a total of 40 Minutes in face to face in clinical consultation, greater than 50% of which was counseling/coordinating care for left ICA cavernous fistula/aneurysm s/p stagedembolization.

## 2019-01-10 ENCOUNTER — Telehealth (HOSPITAL_COMMUNITY): Payer: Self-pay

## 2019-01-10 NOTE — Telephone Encounter (Signed)
Pt called multiple times wanting to know if ADA paperwork has been faxed. I returned her call and let her know that it was faxed on 01/10/19. She confirmed that she spoke with [place] and they received the information. She needs the same form faxed to [facility] again so that she can get her short term disability. I got the fax number from her and faxed the info to the number given along with her last office notes. AW

## 2019-01-30 ENCOUNTER — Telehealth (HOSPITAL_COMMUNITY): Payer: Self-pay

## 2019-01-30 ENCOUNTER — Other Ambulatory Visit (HOSPITAL_COMMUNITY): Payer: Self-pay | Admitting: Interventional Radiology

## 2019-01-30 DIAGNOSIS — I671 Cerebral aneurysm, nonruptured: Secondary | ICD-10-CM

## 2019-01-30 DIAGNOSIS — I729 Aneurysm of unspecified site: Secondary | ICD-10-CM

## 2019-01-30 NOTE — Telephone Encounter (Signed)
Returned pt's call. No answer.

## 2019-02-07 ENCOUNTER — Ambulatory Visit (HOSPITAL_COMMUNITY): Payer: 59

## 2019-02-07 ENCOUNTER — Encounter (HOSPITAL_COMMUNITY): Payer: Self-pay

## 2019-02-07 ENCOUNTER — Other Ambulatory Visit: Payer: Self-pay

## 2019-02-07 ENCOUNTER — Ambulatory Visit (HOSPITAL_COMMUNITY)
Admission: RE | Admit: 2019-02-07 | Discharge: 2019-02-07 | Disposition: A | Discharge status: Home / Self Care | Payer: 59 | Source: Ambulatory Visit | Attending: Interventional Radiology | Admitting: Interventional Radiology

## 2019-02-07 DIAGNOSIS — I729 Aneurysm of unspecified site: Secondary | ICD-10-CM

## 2019-02-07 DIAGNOSIS — I671 Cerebral aneurysm, nonruptured: Secondary | ICD-10-CM | POA: Diagnosis not present

## 2019-02-08 ENCOUNTER — Other Ambulatory Visit (HOSPITAL_COMMUNITY): Payer: Self-pay | Admitting: Interventional Radiology

## 2019-02-08 DIAGNOSIS — I771 Stricture of artery: Secondary | ICD-10-CM

## 2019-02-11 ENCOUNTER — Encounter: Payer: Self-pay | Admitting: Neurology

## 2019-02-11 ENCOUNTER — Telehealth: Payer: Self-pay | Admitting: Neurology

## 2019-02-11 ENCOUNTER — Other Ambulatory Visit: Payer: Self-pay

## 2019-02-11 ENCOUNTER — Ambulatory Visit (HOSPITAL_COMMUNITY)
Admission: RE | Admit: 2019-02-11 | Discharge: 2019-02-11 | Disposition: A | Discharge status: Home / Self Care | Payer: 59 | Source: Ambulatory Visit | Attending: Interventional Radiology | Admitting: Interventional Radiology

## 2019-02-11 DIAGNOSIS — I771 Stricture of artery: Secondary | ICD-10-CM

## 2019-02-11 NOTE — Telephone Encounter (Signed)
Patient states she is worsening. She states she saw the MD she was referred to, and he stated he wanted to wait for treatment after COVID-19. Patient then starts vomiting profusely shortly after  and states her left eye was swollen completely shut. Patient went to ER and they stated she had an aneurysm behind her left eye. They did a few procedures that failed. Patient has had an MRI. MRA and MRV. Patient has ADA to protect her job and have insurance. Today is the patients last day on ADA. She is also unsatisfied with Dr. Lew Dawes care. She would like to see Dr. [name] ASAP to discuss further treatment. Patient states that she is unable to work due to her condition worsening quickly due to not having time to heal. Patient prefers another MRI to make sure aneurysm is gone.

## 2019-02-11 NOTE — Telephone Encounter (Signed)
This patient came into our office today, she is having ongoing symptoms, she just recently had MRI of the brain and MRA and MRV done 3 days ago.  The patient has a left carotid-cavernous fistula.  She has had issues with headaches, nausea and vomiting.  I will write a letter to keep her out of work for the next 2 weeks.  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

## 2019-02-11 NOTE — Progress Notes (Signed)
Chief Complaint: Patient was seen in consultation today for left ICA cavernous fistula/aneurysm s/pstagedembolization.  Referring Physician(s): Hillary Bow  Supervising Physician: Julieanne Cotton  Patient Status: [facility] - Out-pt  History of Present Illness: [name] is a 48 y.o. female with a past medical history as below, with pertinent past medical history including hypertension, migraines, pulmonary embolism 2005, DVT 2005, hiatal hernia, GERD, Barrett's esophagus, iron deficiency anemia, vitamin D deficiency, B12 deficiency, gout, anxiety, and depression. She is known to [facility] and has been followed by Dr. Corliss Skains since 07/2018. She first presented to our department at the request of Dr. Anne Hahn for management of bilateral ICA aneurysms. She consulted with Dr. Corliss Skains 08/17/2018 to discuss management options. At that time, patient decided to pursue endovascular intervention for her aneurysms. Unfortunately, due to restrictions regarding COVID-19, this has been postponed.  Patient presented to [facility] ED 10/19/2018 with complaint of tinnitus and left eye pain. 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 She underwent an image-guided cerebral arteriogram with staged embolization of her left ICA cavernous fistula/aneurysmwith coiling and stenting 10/23/2018 with Dr. Corliss Skains. Patient has remained admitted to [facility] since procedure for observation. Since procedure, it appears that patient's left eye has worsened (worsening erythema/eccymosis, subjective diplopia on lateral gaze, pupil size 4 mm). Due to this finding, it was recommended that patient undergo staged embolization while she was still admitted. She underwentan image-guided cerebral arteriogram with staged embolization of her left ICA  cavernous fistula/aneurysmwith coiling 10/31/2018 with Dr. Corliss Skains.She was discharged home 11/02/2018 in stable condition.   She presented to [facility] for another image-guided cerebral arteriogram with staged embolization of right carotid cavernous sinus region filling via the intra cavernous channels from the left cavernous sinus using coils 12/05/2018 by Dr. Corliss Skains. She was discharged home 12/07/2018 in stable condition. She consulted with Dr. Corliss Skains for follow-up 01/02/2019. At that time, it was determined that patient would undergo MR/MRA approximately 2-3 months from most recent procedure to monitor for changes.  MR/MRA brain/head 02/07/2019: 1. Unchanged non-contrast MRI appearance of the brain since prior exam 11/22/2018. No acute intracranial abnormality. 2. As compared to prior MRA 11/22/2018, abnormally prominent flow related signal at the level of the right cavernous sinus persists, but has significantly decreased. 3. Prominent abnormal flow related signal at the level of the left cavernous sinus is similar to slightly increased since prior MRA. The left ICA siphon is difficult to separately delineate at this level. However, the left internal carotid artery appears patent, although with somewhat diminished flow related enhancement at the paraclinoid segment. 4. Otherwise, no significant interval change as compared to prior MRA. An aneurysmal/dolichoectatic distal cervical right ICA is again partially imaged at the caudal extent of the field of view.  Patient presents today for follow-up to discuss findings of her recent MR/MRA brain/head 02/07/2019. Patient awake and alert sitting in chair. Complains of intermittent diplopia when looking to left, stable since prior consult 01/02/2019. Denies headache, weakness, numbness/tingling, dizziness, hearing changes, or tinnitus.  Patient is currently taking Aspirin 81 mg once daily.   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---  Depression    GERD (gastroesophageal reflux disease)    12/04/2018- not since surgery for Hiatal Hernia   Head injury with loss of consciousness (HCC) 05/2018   Hiatal hernia    History of blood transfusion 2007   History of DVT of lower extremity    08-14-2003  RIGHT LOWER EXTREMITY-- TREATED W/ COUMADIN----  per pt none since   History of palpitations    denies   Hypertension    Idiopathic chronic gout    12-20-2017  per pt stable ,   last episode 02/ 2019   Iron deficiency anemia    Migraines    Personal history of PE (pulmonary embolism)    03/ 2005 RIGHT SIDE  --- treated w/ coumadin---  per pt none since   Pneumonia 2019   PONV (postoperative nausea and vomiting)    Tinea versicolor    Vitamin D deficiency     Past Surgical History:  Procedure Laterality Date   BIOPSY  11/29/2017   Procedure: BIOPSY;  Surgeon: [name], MD;  Location: [facility] ENDOSCOPY;  Service: Endoscopy;;   BRONCHOSCOPY  05-20-2004  dr clance   ESOPHAGOGASTRODUODENOSCOPY (EGD) WITH PROPOFOL N/A 11/29/2017   Procedure: ESOPHAGOGASTRODUODENOSCOPY (EGD) WITH PROPOFOL;  Surgeon: [name], MD;  Location: [facility] ENDOSCOPY;  Service: Endoscopy;  Laterality: N/A;   EXPLORATORY LAPAROTOMY  AGE 32    [facility]   for constipation   INSERTION OF MESH N/A 12/26/2017   Procedure: INSERTION OF BIO A MESH;  Surgeon: [name], MD;  Location: [facility] ORS;  Service: General;  Laterality: N/A;   IR ANGIO INTRA EXTRACRAN SEL COM CAROTID INNOMINATE BILAT MOD SED  10/20/2018   IR ANGIO INTRA EXTRACRAN SEL COM CAROTID INNOMINATE UNI R MOD SED  10/31/2018   IR ANGIO INTRA EXTRACRAN SEL INTERNAL CAROTID UNI L MOD SED  10/23/2018   IR ANGIO INTRA EXTRACRAN SEL INTERNAL CAROTID UNI L MOD SED  10/31/2018   IR ANGIO INTRA EXTRACRAN SEL INTERNAL CAROTID UNI L MOD  SED  12/05/2018   IR ANGIO VERTEBRAL SEL SUBCLAVIAN INNOMINATE UNI R MOD SED  10/20/2018   IR ANGIO VERTEBRAL SEL VERTEBRAL UNI L MOD SED  10/20/2018   IR ANGIOGRAM FOLLOW UP STUDY  10/23/2018   IR ANGIOGRAM FOLLOW UP STUDY  10/31/2018   IR ANGIOGRAM FOLLOW UP STUDY  12/05/2018   IR NEURO EACH ADD'L AFTER BASIC UNI LEFT (MS)  10/31/2018   IR TRANSCATH/EMBOLIZ  10/23/2018   IR TRANSCATH/EMBOLIZ  10/31/2018   IR TRANSCATH/EMBOLIZ  12/05/2018   IR VENO/JUGULAR RIGHT  12/05/2018   KNEE ARTHROSCOPY W/ ACL RECONSTRUCTION Right 09/2003   LAPAROSCOPY BILATERAL TUBAL FULGERATION AND ATTEMPTED THERMACHOICE ABLATION WITH UTERINE PERFORATION  07-17-2009   dr meisinger  [facility]   LUNG LOBECTOMY  1991   right lower lobe for massive hemoptyosis   MINI-THORACOTOMY WEDGE RESECTION OF LEFT LOWER LOBE CYST Left 06-24-2004   dr Edwyna [facility]   hemoptyosis   RADIOLOGY WITH ANESTHESIA N/A 10/23/2018   Procedure: IR WITH ANESTHESIA FOR EMBOLIZATION;  Surgeon: Julieanne Cotton, MD;  Location: [facility] OR;  Service: Radiology;  Laterality: N/A;   RADIOLOGY WITH ANESTHESIA N/A 10/31/2018   Procedure: EMBOLIZATION;  Surgeon: Julieanne Cotton, MD;  Location: [facility] OR;  Service: Radiology;  Laterality: N/A;   RADIOLOGY WITH ANESTHESIA N/A 12/05/2018   Procedure: EMBOLIZATION;  Surgeon: Julieanne Cotton, MD;  Location: [facility] OR;  Service: Radiology;  Laterality: N/A;    Allergies: Guaifenesin, Iron, Doxepin, Pseudoeph-doxylamine-dm-apap, Pseudoephedrine, Sulfa antibiotics, Hydrocodone-acetaminophen, Metoprolol succinate, Celexa [citalopram  hydrobromide], Codeine, Hydromorphone hcl, Morphine, Pantoprazole sodium, Prednisone, and Tetracyclines & related  Medications: Prior to Admission medications   Medication Sig Start Date End Date Taking? Authorizing Provider  allopurinol (ZYLOPRIM) 100 MG tablet Take 1 tablet (100 mg total) by mouth daily. Must keep visit on 11/03/17 to get refills Patient taking differently: Take 100 mg by  mouth 2 (two) times daily as needed (for gout flare up). Must keep visit on 11/03/17 to get refills 11/03/17   Plotnikov, Evie Lacks, MD  aspirin 81 MG chewable tablet Chew 1 tablet (81 mg total) by mouth daily. 11/02/18   Domenic Polite, MD  brimonidine (ALPHAGAN) 0.2 % ophthalmic solution Place 1 drop into the left eye 3 (three) times daily. Patient taking differently: Place 1 drop into the left eye 2 (two) times a day.  11/02/18   Domenic Polite, MD  Cholecalciferol (VITAMIN D3) 2000 units TABS Take 2,000 Units by mouth daily.     [provider]  diazepam (VALIUM) 5 MG tablet Take 1 tablet (5 mg total) by mouth every 12 (twelve) hours as needed for anxiety. Patient taking differently: Take 5 mg by mouth at bedtime.  11/22/18   Plotnikov, Evie Lacks, MD  ferrous sulfate 325 (65 FE) MG tablet Take 325 mg by mouth daily with breakfast.    [provider]  hydrochlorothiazide (HYDRODIURIL) 25 MG tablet Take 1 tablet (25 mg total) by mouth daily. 11/22/18   Plotnikov, Evie Lacks, MD  KLOR-CON M20 20 MEQ tablet TAKE 1 TABLET BY MOUTH EVERY DAY Patient taking differently: Take 20 mEq by mouth daily.  11/13/18   Plotnikov, Evie Lacks, MD  latanoprost (XALATAN) 0.005 % ophthalmic solution Place 1 drop into the left eye at bedtime. 11/02/18   Domenic Polite, MD  Multiple Vitamin (MULTIVITAMIN WITH MINERALS) TABS tablet Take 1 tablet by mouth daily. One-A-Day Multivitamin    [provider]  polyvinyl alcohol (LIQUIFILM TEARS) 1.4 % ophthalmic solution Place 1 drop into the left eye 4 (four) times daily. Patient taking differently: Place 1 drop into the left eye 2 (two) times a day.  11/02/18   Domenic Polite, MD  promethazine (PHENERGAN) 12.5 MG tablet Take 1 tablet (12.5 mg total) by mouth every 8 (eight) hours as needed for nausea or vomiting. 12/20/18   Plotnikov, Evie Lacks, MD  timolol (TIMOPTIC) 0.5 % ophthalmic solution Place 1 drop into the left eye 2 (two) times daily. 11/02/18    Domenic Polite, MD  White Petrolatum-Mineral Oil (STYE) 31.9-57.7 % OINT Place 1 application into the left eye at bedtime.    [provider]     Family History  Problem Relation Age of Onset   Hypertension Other    Diabetes Other     Social History   Socioeconomic History   Marital status: Single    Spouse name: Not on file   Number of children: Not on file   Years of education: Not on file   Highest education level: Not on file  Occupational History   Not on file  Social Needs   Financial resource strain: Not on file   Food insecurity    Worry: Not on file    Inability: Not on file   Transportation needs    Medical: Not on file    Non-medical: Not on file  Tobacco Use   Smoking status: Never Smoker   Smokeless tobacco: Never Used  Substance and Sexual Activity   Alcohol use: Yes    Comment: rare, maybe  1 a year   Drug use: No   Sexual activity: Not on file    Comment: BTL  Lifestyle   Physical activity    Days per week: Not on file    Minutes per session: Not on file   Stress: Not on file  Relationships   Social connections    Talks on phone: Not on file    Gets together: Not on file    Attends religious service: Not on file    Active member of club or organization: Not on file    Attends meetings of clubs or organizations: Not on file    Relationship status: Not on file  Other Topics Concern   Not on file  Social History Narrative   Not on file     Review of Systems: A 12 point ROS discussed and pertinent positives are indicated in the HPI above.  All other systems are negative.  Review of Systems  Constitutional: Negative for chills and fever.  HENT: Negative for hearing loss and tinnitus.   Eyes: Positive for visual disturbance.  Respiratory: Negative for shortness of breath and wheezing.   Cardiovascular: Negative for chest pain and palpitations.  Neurological: Negative for dizziness, speech difficulty, weakness,  numbness and headaches.  Psychiatric/Behavioral: Negative for behavioral problems and confusion.     Physical Exam Constitutional:      General: She is not in acute distress.    Appearance: Normal appearance.  Eyes:     Conjunctiva/sclera: Conjunctivae normal.     Comments: Mild left ptosis. PEERL bilaterally, 3 mm on left and 2 mm on right. EOMs with subjective diplopia of left lateral gaze, and superior/inferior gaze.  Pulmonary:     Effort: Pulmonary effort is normal. No respiratory distress.  Skin:    General: Skin is warm and dry.  Neurological:     Mental Status: She is alert and oriented to person, place, and time.  Psychiatric:        Mood and Affect: Mood normal.        Behavior: Behavior normal.        Thought Content: Thought content normal.        Judgment: Judgment normal.      Imaging: Mr Angio Head Wo Contrast  Result Date: 02/08/2019 CLINICAL DATA:  Carotid cavernous fistula, aneurysm. EXAM: MRI HEAD WITHOUT CONTRAST MRA HEAD WITHOUT CONTRAST TECHNIQUE: Multiplanar, multiecho pulse sequences of the brain and surrounding structures were obtained without intravenous contrast. Angiographic images of the head were obtained using MRA technique without contrast. COMPARISON:  Head CT 12/05/2018, neuro interventional report from procedure 12/05/2018, MRI/MRA head 11/21/2020 FINDINGS: MRI HEAD FINDINGS Brain: No evidence of acute infarct. No evidence of intracranial mass. No chronic intracranial hemorrhage. No midline shift or extra-axial collection. Mild scattered T2/FLAIR hyperintensity within the cerebral white matter is nonspecific, but consistent with chronic small vessel ischemic disease. Cerebral volume is normal. Vascular: Separately reported Skull and upper cervical spine: Normal marrow signal. Sinuses/Orbits: The imaged globes and orbits demonstrate no acute abnormality. Mild ethmoid sinus mucosal thickening. No significant mastoid effusion. MRA HEAD FINDINGS Again  demonstrated at the caudal extent of the field of view, there is a partially visible aneurysmal/dolichoectatic distal cervical right ICA. The intracranial right internal carotid artery is patent without significant stenosis. Prominent abnormal flow related signal at the level of the left cavernous sinus is similar to slightly increased as compared to prior MRA 11/22/2018. The left ICA siphon is difficult to separately delineate at this level. However,  the left internal carotid artery siphon appears patent, although with somewhat diminished flow related enhancement at the paraclinoid segment. Abnormally prominent flow related signal at the level of the right cavernous sinus persists anterior, but has significantly decreased since prior exam. The bilateral middle cerebral arteries and anterior cerebral arteries are patent without significant proximal stenosis. The distal vertebral arteries are patent. The left vertebral artery is again significantly dominant/mildy dolichoectatic. The basilar artery is patent without significant stenosis. The bilateral posterior cerebral arteries are patent without significant proximal stenosis. IMPRESSION: MRI BRAIN: Unchanged non-contrast MRI appearance of the brain since prior exam 11/22/2018. No acute intracranial abnormality. MRA HEAD: As compared to prior MRA 11/22/2018, abnormally prominent flow related signal at the level of the right cavernous sinus persists, but has significantly decreased. Prominent abnormal flow related signal at the level of the left cavernous sinus is similar to slightly increased since prior MRA. The left ICA siphon is difficult to separately delineate at this level. However, the left internal carotid artery appears patent, although with somewhat diminished flow related enhancement at the paraclinoid segment. Otherwise, no significant interval change as compared to prior MRA. An aneurysmal/dolichoectatic distal cervical right ICA is again partially  imaged at the caudal extent of the field of view. Electronically Signed   By: Jackey LogeKyle  Golden   On: 02/08/2019 14:32   Mr Brain Wo Contrast  Result Date: 02/08/2019 CLINICAL DATA:  Carotid cavernous fistula, aneurysm. EXAM: MRI HEAD WITHOUT CONTRAST MRA HEAD WITHOUT CONTRAST TECHNIQUE: Multiplanar, multiecho pulse sequences of the brain and surrounding structures were obtained without intravenous contrast. Angiographic images of the head were obtained using MRA technique without contrast. COMPARISON:  Head CT 12/05/2018, neuro interventional report from procedure 12/05/2018, MRI/MRA head 11/21/2020 FINDINGS: MRI HEAD FINDINGS Brain: No evidence of acute infarct. No evidence of intracranial mass. No chronic intracranial hemorrhage. No midline shift or extra-axial collection. Mild scattered T2/FLAIR hyperintensity within the cerebral white matter is nonspecific, but consistent with chronic small vessel ischemic disease. Cerebral volume is normal. Vascular: Separately reported Skull and upper cervical spine: Normal marrow signal. Sinuses/Orbits: The imaged globes and orbits demonstrate no acute abnormality. Mild ethmoid sinus mucosal thickening. No significant mastoid effusion. MRA HEAD FINDINGS Again demonstrated at the caudal extent of the field of view, there is a partially visible aneurysmal/dolichoectatic distal cervical right ICA. The intracranial right internal carotid artery is patent without significant stenosis. Prominent abnormal flow related signal at the level of the left cavernous sinus is similar to slightly increased as compared to prior MRA 11/22/2018. The left ICA siphon is difficult to separately delineate at this level. However, the left internal carotid artery siphon appears patent, although with somewhat diminished flow related enhancement at the paraclinoid segment. Abnormally prominent flow related signal at the level of the right cavernous sinus persists anterior, but has significantly  decreased since prior exam. The bilateral middle cerebral arteries and anterior cerebral arteries are patent without significant proximal stenosis. The distal vertebral arteries are patent. The left vertebral artery is again significantly dominant/mildy dolichoectatic. The basilar artery is patent without significant stenosis. The bilateral posterior cerebral arteries are patent without significant proximal stenosis. IMPRESSION: MRI BRAIN: Unchanged non-contrast MRI appearance of the brain since prior exam 11/22/2018. No acute intracranial abnormality. MRA HEAD: As compared to prior MRA 11/22/2018, abnormally prominent flow related signal at the level of the right cavernous sinus persists, but has significantly decreased. Prominent abnormal flow related signal at the level of the left cavernous sinus is similar to slightly increased  since prior MRA. The left ICA siphon is difficult to separately delineate at this level. However, the left internal carotid artery appears patent, although with somewhat diminished flow related enhancement at the paraclinoid segment. Otherwise, no significant interval change as compared to prior MRA. An aneurysmal/dolichoectatic distal cervical right ICA is again partially imaged at the caudal extent of the field of view. Electronically Signed   By: Jackey LogeKyle  Golden   On: 02/08/2019 14:32    Labs:  CBC: Recent Labs    11/02/18 0345 12/05/18 0740 12/05/18 1319 12/05/18 1321 12/05/18 1651 12/06/18 0435  WBC 12.5* 11.9* 11.6*  --   --  16.3*  HGB 9.7* 13.4 8.5* 8.2* 8.5* 8.3*  HCT 29.6* 41.0 25.7* 24.0* 25.0* 25.2*  PLT 310 321 228  --   --  248    COAGS: Recent Labs    10/22/18 0923 10/29/18 0726 10/31/18 0530 12/05/18 0740 12/05/18 1319  INR 1.0 1.1 1.1 1.0  --   APTT  --   --   --  32 >200*    BMP: Recent Labs    10/31/18 0530 11/01/18 0443 12/05/18 0740 12/05/18 1321 12/05/18 1651 12/06/18 0435  NA 139 139 139 143 144 139  K 3.7 3.3* 3.7 3.3* 3.1*  3.4*  CL 100 107 105  --  112* 112*  CO2 28 25 24   --   --  20*  GLUCOSE 98 86 86 148* 128* 107*  BUN 11 11 16   --  9 8  CALCIUM 9.8 8.4* 9.8  --   --  8.6*  CREATININE 0.87 0.90 0.75  --  0.40* 0.67  GFRNONAA >60 >60 >60  --   --  >60  GFRAA >60 >60 >60  --   --  >60    LIVER FUNCTION TESTS: Recent Labs    07/11/18 1300 10/19/18 1837 10/26/18 1129 10/29/18 0726  BILITOT 1.5* 1.4* 1.4* 1.1  AST 45* 25 35 14*  ALT 13 12 32 18  ALKPHOS 49 57 49 47  PROT 7.6 8.6* 7.6 6.6  ALBUMIN 4.3 5.2* 4.1 3.6     Assessment and Plan:  Left ICA cavernous fistula/aneurysm s/p staged embolization using coiling and stenting 10/23/2018 by Dr. [name], s/p staged embolization using coiling 10/31/2018 by Dr. [name], s/p staged embolization of right carotid cavernous sinus region filling via the intra cavernous channels from the left cavernous sinus using coils 12/05/2018 by Dr. [name]. Dr. [name] was present for consultation. Discussed recent MRI/MRA results with patient. Brought to her attention was prominent abnormal flow at the level of the left cavernous sinus. Explained that this is probably representing an abnormal communication from her left carotid caverous fistula crossing into the right carotid cavernous sinus and subsequently into the right superficial temporal veins. Recommended routine imaging scans to monitor for changes in 6 weeks. Will re-evaluate for possible staged embolization based on this imaging scan/clinical symptoms. However, prior to possible staged embolization, recommend that patient gain an additional 5-10 pounds before moving forward with any procedure.  Discussed patient's left eye. She states that she has an ophthalmology consult today. Recommended patient follow-up with her ophthalmologist regularly.  Plan for follow-up with MRI/MRA brain/head (without contrast) 6 weeks from prior scan 02/07/2019. Informed patient that our schedulers will call us to set up this  imaging scan. Instructed patient to continue taking Aspirin 81 mg once daily.  All questions answered and concerns addressed. Patient conveys understanding and agrees with plan.  Thank you for this interesting consult.  I greatly enjoyed meeting [name] and look forward to participating in their care.  A copy of this report was sent to the requesting provider on this date.  Electronically Signed: Elwin Mocha, PA-C 02/11/2019, 10:49 AM   I spent a total of 40 Minutes in face to face in clinical consultation, greater than 50% of which was counseling/coordinating care for left ICA cavernous fistula/aneurysm s/pstagedembolization.

## 2019-02-11 NOTE — Telephone Encounter (Signed)
Dr. [name] wrote pt. Out of work for 2 weeks due to worsening symptoms. Patient requested that I fax letter from Dr. [name] to her insurance companies. I faxed to numbers patient requested.

## 2019-02-12 ENCOUNTER — Encounter: Payer: Self-pay | Admitting: Neurology

## 2019-02-12 ENCOUNTER — Ambulatory Visit (INDEPENDENT_AMBULATORY_CARE_PROVIDER_SITE_OTHER): Payer: 59 | Admitting: Neurology

## 2019-02-12 VITALS — BP 146/104 | HR 98 | Temp 98.0°F | Ht 67.0 in | Wt 109.0 lb

## 2019-02-12 DIAGNOSIS — I671 Cerebral aneurysm, nonruptured: Secondary | ICD-10-CM | POA: Diagnosis not present

## 2019-02-12 MED ORDER — PROMETHAZINE HCL 12.5 MG PO TABS: 12.5000 mg | ORAL_TABLET | Freq: Three times a day (TID) | ORAL | 1 refills | Status: DC | PRN

## 2019-02-12 NOTE — Telephone Encounter (Signed)
It appears that pt has an appt this PM with Dr. [name].

## 2019-02-12 NOTE — Progress Notes (Signed)
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  The patient has a right cervical carotid artery aneurysm, but she was admitted to the hospital on 19 Oct 2018 with a left carotid cavernous fistula.  The patient developed proptosis and double vision.  She has had several procedures to obliterate the fistula, the most recent procedure was on the 05 December 2018.  The patient has had a lot of nausea and vomiting following this procedure, this has improved some but the patient still has some queasiness.  She has lost almost 20 pounds.  The patient has been out of work since 19 Oct 2018.  She continues to have double vision, she is followed by Dr. Alben Spittle from ophthalmology, she takes timolol eyedrops to help keep down the intraocular pressures.  She has no longer had any further blackouts but she continues to have pulsatile tinnitus.  Recent MRI of the brain and MRA of the head shows a persistent left carotid cavernous fistula that may be enlarging.  The patient has been told that she will require a fourth procedure to fully ablate the fistula.  She comes to this office for further evaluation.  She denies any significant problems with headaches, she denies numbness or weakness of the face, arms, legs.  Past Medical History:  Diagnosis Date  . Allergic rhinitis   . Anxiety   . B12 deficiency   . Barrett's esophagus   . Chronic constipation   . 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 Depression   . GERD (gastroesophageal reflux disease)    12/04/2018- not since surgery for Hiatal Hernia  . Head injury with loss of consciousness (HCC) 05/2018  . Hiatal hernia   . History of blood transfusion 2007  . History of DVT of lower extremity    08-14-2003  RIGHT LOWER EXTREMITY-- TREATED W/  COUMADIN----  per pt none since  . History of palpitations    denies  . Hypertension   . Idiopathic chronic gout    12-20-2017  per pt stable ,   last episode 02/ 2019  . Iron deficiency anemia   . Migraines   . Personal history of PE (pulmonary embolism)    03/ 2005 RIGHT SIDE  --- treated w/ coumadin---  per pt none since  . Pneumonia 2019  . PONV (postoperative nausea and vomiting)   . Tinea versicolor   . Vitamin D deficiency     Past Surgical History:  Procedure Laterality Date  . BIOPSY  11/29/2017   Procedure: BIOPSY;  Surgeon: [name], MD;  Location: [facility];  Service: Endoscopy;;  . BRONCHOSCOPY  05-20-2004  dr clance  . ESOPHAGOGASTRODUODENOSCOPY (EGD) WITH PROPOFOL N/A 11/29/2017   Procedure: ESOPHAGOGASTRODUODENOSCOPY (EGD) WITH PROPOFOL;  Surgeon: [name], MD;  Location: [facility];  Service: Endoscopy;  Laterality: N/A;  . EXPLORATORY LAPAROTOMY  AGE 19    [facility]   for constipation  . INSERTION OF MESH N/A 12/26/2017   Procedure: INSERTION OF BIO A MESH;  Surgeon: [name], MD;  Location: [facility];  Service: General;  Laterality: N/A;  . IR ANGIO INTRA EXTRACRAN SEL COM CAROTID INNOMINATE BILAT MOD SED  10/20/2018  . IR ANGIO INTRA EXTRACRAN SEL COM CAROTID INNOMINATE UNI R MOD SED  10/31/2018  . IR  ANGIO INTRA EXTRACRAN SEL INTERNAL CAROTID UNI L MOD SED  10/23/2018  . IR ANGIO INTRA EXTRACRAN SEL INTERNAL CAROTID UNI L MOD SED  10/31/2018  . IR ANGIO INTRA EXTRACRAN SEL INTERNAL CAROTID UNI L MOD SED  12/05/2018  . IR ANGIO VERTEBRAL SEL SUBCLAVIAN INNOMINATE UNI R MOD SED  10/20/2018  . IR ANGIO VERTEBRAL SEL VERTEBRAL UNI L MOD SED  10/20/2018  . IR ANGIOGRAM FOLLOW UP STUDY  10/23/2018  . IR ANGIOGRAM FOLLOW UP STUDY  10/31/2018  . IR ANGIOGRAM FOLLOW UP STUDY  12/05/2018  . IR NEURO EACH ADD'L AFTER BASIC UNI LEFT (MS)  10/31/2018  . IR TRANSCATH/EMBOLIZ  10/23/2018  . IR TRANSCATH/EMBOLIZ  10/31/2018  . IR TRANSCATH/EMBOLIZ  12/05/2018  . IR VENO/JUGULAR RIGHT   12/05/2018  . KNEE ARTHROSCOPY W/ ACL RECONSTRUCTION Right 09/2003  . LAPAROSCOPY BILATERAL TUBAL FULGERATION AND ATTEMPTED THERMACHOICE ABLATION WITH UTERINE PERFORATION  07-17-2009   dr  [place]  [facility]  . LUNG LOBECTOMY  1991   right lower lobe for massive hemoptyosis  . MINI-THORACOTOMY WEDGE RESECTION OF LEFT LOWER LOBE CYST Left 06-24-2004   dr Arlyce Dice [facility]   hemoptyosis  . RADIOLOGY WITH ANESTHESIA N/A 10/23/2018   Procedure: IR WITH ANESTHESIA FOR EMBOLIZATION;  Surgeon: Luanne Bras, MD;  Location: [facility];  Service: Radiology;  Laterality: N/A;  . RADIOLOGY WITH ANESTHESIA N/A 10/31/2018   Procedure: EMBOLIZATION;  Surgeon: Luanne Bras, MD;  Location: [facility];  Service: Radiology;  Laterality: N/A;  . RADIOLOGY WITH ANESTHESIA N/A 12/05/2018   Procedure: EMBOLIZATION;  Surgeon: Luanne Bras, MD;  Location: [facility];  Service: Radiology;  Laterality: N/A;    Family History  Problem Relation Age of Onset  . Hypertension Other   . Diabetes Other     Social history:  reports that she has never smoked. She has never used smokeless tobacco. She reports current alcohol use. She reports that she does not use drugs.    Allergies  Allergen Reactions  . Guaifenesin Shortness Of Breath  . Iron Anaphylaxis and Other (See Comments)    IV only  . Doxepin Hives  . Pseudoeph-Doxylamine-Dm-Apap Hives  . Pseudoephedrine Hives  . Sulfa Antibiotics Hives  . Hydrocodone-Acetaminophen     UNSPECIFIED REACTION   . Metoprolol Succinate Swelling    UNSPECIFIED EDEMA  . Celexa [Citalopram Hydrobromide] Other (See Comments)    HEADACHE   . Codeine Other (See Comments)    tiredness  . Hydromorphone Hcl Nausea And Vomiting  . Morphine Other (See Comments)    Does not work; hallucinations and irritable  . Pantoprazole Sodium Other (See Comments)    heartburn  . Prednisone Rash  . Tetracyclines & Related Rash    Medications:  Prior to Admission medications   Medication Sig Start Date  End Date Taking? Authorizing Provider  allopurinol (ZYLOPRIM) 100 MG tablet Take 1 tablet (100 mg total) by mouth daily. Must keep visit on 11/03/17 to get refills Patient taking differently: Take 100 mg by mouth 2 (two) times daily as needed (for gout flare up). Must keep visit on 11/03/17 to get refills 11/03/17  Yes Plotnikov, Evie Lacks, MD  aspirin 81 MG chewable tablet Chew 1 tablet (81 mg total) by mouth daily. 11/02/18  Yes Domenic Polite, MD  Cholecalciferol (VITAMIN D3) 2000 units TABS Take 2,000 Units by mouth daily.    Yes [provider]  diazepam (VALIUM) 5 MG tablet Take 1 tablet (5 mg total) by mouth every 12 (twelve) hours as needed  for anxiety. Patient taking differently: Take 5 mg by mouth at bedtime.  11/22/18  Yes Plotnikov, Georgina QuintAleksei V, MD  ferrous sulfate 325 (65 FE) MG tablet Take 325 mg by mouth daily with breakfast.   Yes [provider]  hydrochlorothiazide (HYDRODIURIL) 25 MG tablet Take 1 tablet (25 mg total) by mouth daily. 11/22/18  Yes Plotnikov, Georgina QuintAleksei V, MD  KLOR-CON M20 20 MEQ tablet TAKE 1 TABLET BY MOUTH EVERY DAY Patient taking differently: Take 20 mEq by mouth daily.  11/13/18  Yes Plotnikov, Georgina QuintAleksei V, MD  Multiple Vitamin (MULTIVITAMIN WITH MINERALS) TABS tablet Take 1 tablet by mouth daily. One-A-Day Multivitamin   Yes [provider]  polyvinyl alcohol (LIQUIFILM TEARS) 1.4 % ophthalmic solution Place 1 drop into the left eye 4 (four) times daily. Patient taking differently: Place 1 drop into the left eye 2 (two) times a day.  11/02/18  Yes Zannie CoveJoseph, Preetha, MD  promethazine (PHENERGAN) 12.5 MG tablet Take 1 tablet (12.5 mg total) by mouth every 8 (eight) hours as needed for nausea or vomiting. 12/20/18  Yes Plotnikov, Georgina QuintAleksei V, MD  timolol (TIMOPTIC) 0.5 % ophthalmic solution Place 1 drop into the left eye 2 (two) times daily. 11/02/18  Yes Zannie CoveJoseph, Preetha, MD  White Petrolatum-Mineral Oil (STYE) 31.9-57.7 % OINT Place 1 application into the  left eye at bedtime.   Yes [provider]    ROS:  Out of a complete 14 system review of symptoms, the patient complains only of the following symptoms, and all other reviewed systems are negative.  Nausea Double vision  Blood pressure (!) 146/104, pulse 98, temperature 98 F (36.7 C), height 5\' 7"  (1.702 m), weight 109 lb (49.4 kg).  Physical Exam  General: The patient is alert and cooperative at the time of the examination.  Skin: No significant peripheral edema is noted.   Neurologic Exam  Mental status: The patient is alert and oriented x 3 at the time of the examination. The patient has apparent normal recent and remote memory, with an apparently normal attention span and concentration ability.   Cranial nerves: Facial symmetry is not present.  The patient has mild proptosis on the left.  Speech is normal, no aphasia or dysarthria is noted. Extraocular movements are full with the right eye, with the left eye, there is incomplete superior deviation of the left eye. Visual fields are full.  Motor: The patient has good strength in all 4 extremities.  Sensory examination: Soft touch sensation is symmetric on the face, arms, and legs.  Coordination: The patient has good finger-nose-finger and heel-to-shin bilaterally.  Gait and station: The patient has a normal gait. Tandem gait is normal. Romberg is negative. No drift is seen.  Reflexes: Deep tendon reflexes are symmetric.   MRI brain/MRA head 02/07/19:  IMPRESSION: MRI BRAIN:  Unchanged non-contrast MRI appearance of the brain since prior exam 11/22/2018. No acute intracranial abnormality.  MRA HEAD:  As compared to prior MRA 11/22/2018, abnormally prominent flow related signal at the level of the right cavernous sinus persists, but has significantly decreased.  Prominent abnormal flow related signal at the level of the left cavernous sinus is similar to slightly increased since prior MRA. The left  ICA siphon is difficult to separately delineate at this level. However, the left internal carotid artery appears patent, although with somewhat diminished flow related enhancement at the paraclinoid segment.  Otherwise, no significant interval change as compared to prior MRA. An aneurysmal/dolichoectatic distal cervical right ICA is again partially  imaged at the caudal extent of the field of view.   Assessment/Plan:  1.  Carotid cavernous fistula  2.  History of syncope  3.  Probable Marfan syndrome  The patient is to remain out of work until 25 March 2019.  I will dictate a note in regards to this.  The patient will follow-up in 4 months.  She is continue to be followed through Dr. [name].  The fax number for her short-term disability is [phone].  The fax number for [employer] is [phone].  Her case number is [id number].  Greater than 50% of the visit was spent in counseling and coordination of care.  Face-to-face time with the patient was 25 minutes.   Marlan Palau. Keith  MD 02/12/2019 3:49 PM  [facility] [address] [place], [place] [id number]  Phone [phone] Fax [phone]

## 2019-02-13 NOTE — Telephone Encounter (Signed)
Received short term disability form. Dr. [name] completed and signed form and I sent them to medical records for processing. Estimated return to work date without restrictions is 03/25/2019.

## 2019-03-04 ENCOUNTER — Other Ambulatory Visit (HOSPITAL_COMMUNITY): Payer: Self-pay | Admitting: Interventional Radiology

## 2019-03-04 DIAGNOSIS — I671 Cerebral aneurysm, nonruptured: Secondary | ICD-10-CM

## 2019-03-04 DIAGNOSIS — I771 Stricture of artery: Secondary | ICD-10-CM

## 2019-03-04 DIAGNOSIS — I729 Aneurysm of unspecified site: Secondary | ICD-10-CM

## 2019-03-13 ENCOUNTER — Observation Stay (HOSPITAL_COMMUNITY): Payer: 59

## 2019-03-13 ENCOUNTER — Other Ambulatory Visit: Payer: Self-pay

## 2019-03-13 ENCOUNTER — Inpatient Hospital Stay (HOSPITAL_COMMUNITY)
Admission: EM | Admit: 2019-03-13 | Discharge: 2019-03-14 | DRG: 299 | Disposition: A | Discharge status: Home / Self Care | Payer: 59 | Attending: Family Medicine | Admitting: Family Medicine

## 2019-03-13 ENCOUNTER — Encounter (HOSPITAL_COMMUNITY): Payer: Self-pay | Admitting: Emergency Medicine

## 2019-03-13 ENCOUNTER — Emergency Department (HOSPITAL_COMMUNITY): Payer: 59

## 2019-03-13 DIAGNOSIS — Z86718 Personal history of other venous thrombosis and embolism: Secondary | ICD-10-CM

## 2019-03-13 DIAGNOSIS — H0520 Unspecified exophthalmos: Secondary | ICD-10-CM | POA: Diagnosis present

## 2019-03-13 DIAGNOSIS — Z86711 Personal history of pulmonary embolism: Secondary | ICD-10-CM

## 2019-03-13 DIAGNOSIS — Z882 Allergy status to sulfonamides status: Secondary | ICD-10-CM | POA: Diagnosis not present

## 2019-03-13 DIAGNOSIS — I10 Essential (primary) hypertension: Secondary | ICD-10-CM | POA: Diagnosis present

## 2019-03-13 DIAGNOSIS — Q8740 Marfan's syndrome, unspecified: Secondary | ICD-10-CM | POA: Diagnosis not present

## 2019-03-13 DIAGNOSIS — Q8743 Marfan's syndrome with skeletal manifestation: Secondary | ICD-10-CM | POA: Diagnosis not present

## 2019-03-13 DIAGNOSIS — Z8249 Family history of ischemic heart disease and other diseases of the circulatory system: Secondary | ICD-10-CM

## 2019-03-13 DIAGNOSIS — Z902 Acquired absence of lung [part of]: Secondary | ICD-10-CM

## 2019-03-13 DIAGNOSIS — F411 Generalized anxiety disorder: Secondary | ICD-10-CM | POA: Diagnosis present

## 2019-03-13 DIAGNOSIS — Z79899 Other long term (current) drug therapy: Secondary | ICD-10-CM | POA: Diagnosis not present

## 2019-03-13 DIAGNOSIS — K219 Gastro-esophageal reflux disease without esophagitis: Secondary | ICD-10-CM

## 2019-03-13 DIAGNOSIS — I720 Aneurysm of carotid artery: Secondary | ICD-10-CM | POA: Diagnosis present

## 2019-03-13 DIAGNOSIS — Z681 Body mass index (BMI) 19 or less, adult: Secondary | ICD-10-CM | POA: Diagnosis not present

## 2019-03-13 DIAGNOSIS — R636 Underweight: Secondary | ICD-10-CM | POA: Diagnosis present

## 2019-03-13 DIAGNOSIS — E876 Hypokalemia: Secondary | ICD-10-CM

## 2019-03-13 DIAGNOSIS — B360 Pityriasis versicolor: Secondary | ICD-10-CM | POA: Diagnosis present

## 2019-03-13 DIAGNOSIS — Z833 Family history of diabetes mellitus: Secondary | ICD-10-CM | POA: Diagnosis not present

## 2019-03-13 DIAGNOSIS — K2270 Barrett's esophagus without dysplasia: Secondary | ICD-10-CM | POA: Diagnosis present

## 2019-03-13 DIAGNOSIS — I671 Cerebral aneurysm, nonruptured: Secondary | ICD-10-CM | POA: Diagnosis present

## 2019-03-13 DIAGNOSIS — Z881 Allergy status to other antibiotic agents status: Secondary | ICD-10-CM | POA: Diagnosis not present

## 2019-03-13 DIAGNOSIS — H539 Unspecified visual disturbance: Secondary | ICD-10-CM

## 2019-03-13 DIAGNOSIS — N179 Acute kidney failure, unspecified: Secondary | ICD-10-CM

## 2019-03-13 DIAGNOSIS — K449 Diaphragmatic hernia without obstruction or gangrene: Secondary | ICD-10-CM | POA: Diagnosis present

## 2019-03-13 DIAGNOSIS — I7771 Dissection of carotid artery: Secondary | ICD-10-CM | POA: Diagnosis present

## 2019-03-13 DIAGNOSIS — G43909 Migraine, unspecified, not intractable, without status migrainosus: Secondary | ICD-10-CM | POA: Diagnosis present

## 2019-03-13 DIAGNOSIS — K5909 Other constipation: Secondary | ICD-10-CM | POA: Diagnosis present

## 2019-03-13 DIAGNOSIS — F419 Anxiety disorder, unspecified: Secondary | ICD-10-CM | POA: Diagnosis present

## 2019-03-13 DIAGNOSIS — M1A00X0 Idiopathic chronic gout, unspecified site, without tophus (tophi): Secondary | ICD-10-CM | POA: Diagnosis present

## 2019-03-13 DIAGNOSIS — Z7982 Long term (current) use of aspirin: Secondary | ICD-10-CM | POA: Diagnosis not present

## 2019-03-13 DIAGNOSIS — E785 Hyperlipidemia, unspecified: Secondary | ICD-10-CM | POA: Diagnosis not present

## 2019-03-13 DIAGNOSIS — D649 Anemia, unspecified: Secondary | ICD-10-CM | POA: Diagnosis present

## 2019-03-13 DIAGNOSIS — Z825 Family history of asthma and other chronic lower respiratory diseases: Secondary | ICD-10-CM

## 2019-03-13 DIAGNOSIS — Z20828 Contact with and (suspected) exposure to other viral communicable diseases: Secondary | ICD-10-CM | POA: Diagnosis present

## 2019-03-13 DIAGNOSIS — M419 Scoliosis, unspecified: Secondary | ICD-10-CM | POA: Diagnosis present

## 2019-03-13 DIAGNOSIS — F319 Bipolar disorder, unspecified: Secondary | ICD-10-CM | POA: Diagnosis present

## 2019-03-13 DIAGNOSIS — Z885 Allergy status to narcotic agent status: Secondary | ICD-10-CM | POA: Diagnosis not present

## 2019-03-13 HISTORY — DX: Dissection of carotid artery: I77.71

## 2019-03-13 LAB — CBC WITH DIFFERENTIAL/PLATELET
Abs Immature Granulocytes: 0.03 10*3/uL (ref 0.00–0.07)
Basophils Absolute: 0 10*3/uL (ref 0.0–0.1)
Basophils Relative: 0 %
Eosinophils Absolute: 0.2 10*3/uL (ref 0.0–0.5)
Eosinophils Relative: 2 %
HCT: 42.7 % (ref 36.0–46.0)
Hemoglobin: 14.8 g/dL (ref 12.0–15.0)
Immature Granulocytes: 0 %
Lymphocytes Relative: 23 %
Lymphs Abs: 2.3 10*3/uL (ref 0.7–4.0)
MCH: 30.3 pg (ref 26.0–34.0)
MCHC: 34.7 g/dL (ref 30.0–36.0)
MCV: 87.5 fL (ref 80.0–100.0)
Monocytes Absolute: 0.9 10*3/uL (ref 0.1–1.0)
Monocytes Relative: 9 %
Neutro Abs: 6.8 10*3/uL (ref 1.7–7.7)
Neutrophils Relative %: 66 %
Platelets: 292 10*3/uL (ref 150–400)
RBC: 4.88 MIL/uL (ref 3.87–5.11)
RDW: 12.2 % (ref 11.5–15.5)
WBC: 10.3 10*3/uL (ref 4.0–10.5)
nRBC: 0 % (ref 0.0–0.2)

## 2019-03-13 LAB — TSH: TSH: 3.43 u[IU]/mL (ref 0.350–4.500)

## 2019-03-13 LAB — BASIC METABOLIC PANEL
Anion gap: 10 (ref 5–15)
BUN: 16 mg/dL (ref 6–20)
CO2: 24 mmol/L (ref 22–32)
Calcium: 9.4 mg/dL (ref 8.9–10.3)
Chloride: 103 mmol/L (ref 98–111)
Creatinine, Ser: 1.15 mg/dL — ABNORMAL HIGH (ref 0.44–1.00)
GFR calc Af Amer: >60 mL/min (ref >60)
GFR calc non Af Amer: 56 mL/min — ABNORMAL LOW (ref >60)
Glucose, Bld: 119 mg/dL — ABNORMAL HIGH (ref 70–99)
Potassium: 2.9 mmol/L — ABNORMAL LOW (ref 3.5–5.1)
Sodium: 137 mmol/L (ref 135–145)

## 2019-03-13 LAB — SARS CORONAVIRUS 2 BY RT PCR (HOSPITAL ORDER, PERFORMED IN ~~LOC~~ HOSPITAL LAB): SARS Coronavirus 2: NEGATIVE

## 2019-03-13 MED ORDER — BOOST / RESOURCE BREEZE PO LIQD CUSTOM
1.0000 | Freq: Three times a day (TID) | ORAL | Status: DC
  Administered 2019-03-14: 1 via ORAL
  Filled 2019-03-13: qty 1

## 2019-03-13 MED ORDER — STROKE: EARLY STAGES OF RECOVERY BOOK: Freq: Once | Status: DC

## 2019-03-13 MED ORDER — ASPIRIN 81 MG PO CHEW
324.0000 mg | CHEWABLE_TABLET | Freq: Once | ORAL | Status: AC
  Administered 2019-03-13: 324 mg via ORAL
  Filled 2019-03-13: qty 4

## 2019-03-13 MED ORDER — ACETAMINOPHEN 650 MG RE SUPP: 650.0000 mg | RECTAL | Status: DC | PRN

## 2019-03-13 MED ORDER — SODIUM CHLORIDE 0.9 % IV BOLUS (SEPSIS)
1000.0000 mL | Freq: Once | INTRAVENOUS | Status: AC
  Administered 2019-03-13: 03:00:00 1000 mL via INTRAVENOUS

## 2019-03-13 MED ORDER — SODIUM CHLORIDE 0.9 % IV SOLN
INTRAVENOUS | Status: DC
  Administered 2019-03-13 (×2): via INTRAVENOUS

## 2019-03-13 MED ORDER — POLYVINYL ALCOHOL 1.4 % OP SOLN
1.0000 [drp] | OPHTHALMIC | Status: DC | PRN
  Filled 2019-03-13: qty 15

## 2019-03-13 MED ORDER — TIMOLOL MALEATE 0.5 % OP SOLN
1.0000 [drp] | Freq: Two times a day (BID) | OPHTHALMIC | Status: DC
  Administered 2019-03-13 – 2019-03-14 (×3): 1 [drp] via OPHTHALMIC
  Filled 2019-03-13: qty 5

## 2019-03-13 MED ORDER — POLYVINYL ALCOHOL 1.4 % OP SOLN
1.0000 [drp] | Freq: Two times a day (BID) | OPHTHALMIC | Status: DC
  Administered 2019-03-13 – 2019-03-14 (×3): 1 [drp] via OPHTHALMIC
  Filled 2019-03-13: qty 15

## 2019-03-13 MED ORDER — POTASSIUM CHLORIDE CRYS ER 20 MEQ PO TBCR
20.0000 meq | EXTENDED_RELEASE_TABLET | Freq: Every day | ORAL | Status: DC
  Administered 2019-03-13 – 2019-03-14 (×2): 20 meq via ORAL
  Filled 2019-03-13 (×2): qty 1

## 2019-03-13 MED ORDER — POTASSIUM CHLORIDE CRYS ER 20 MEQ PO TBCR
40.0000 meq | EXTENDED_RELEASE_TABLET | Freq: Once | ORAL | Status: AC
  Administered 2019-03-13: 40 meq via ORAL
  Filled 2019-03-13: qty 2

## 2019-03-13 MED ORDER — PROMETHAZINE HCL 25 MG/ML IJ SOLN
12.5000 mg | Freq: Four times a day (QID) | INTRAMUSCULAR | Status: DC | PRN
  Administered 2019-03-14: 12.5 mg via INTRAVENOUS
  Filled 2019-03-13: qty 1

## 2019-03-13 MED ORDER — IOHEXOL 350 MG/ML SOLN
80.0000 mL | Freq: Once | INTRAVENOUS | Status: AC | PRN
  Administered 2019-03-13: 80 mL via INTRAVENOUS

## 2019-03-13 MED ORDER — ENOXAPARIN SODIUM 40 MG/0.4ML ~~LOC~~ SOLN
40.0000 mg | SUBCUTANEOUS | Status: DC
  Filled 2019-03-13: qty 0.4

## 2019-03-13 MED ORDER — PROPYLENE GLYCOL 0.6 % OP SOLN: 1.0000 [drp] | Freq: Two times a day (BID) | OPHTHALMIC | Status: DC

## 2019-03-13 MED ORDER — FERROUS SULFATE 325 (65 FE) MG PO TABS
325.0000 mg | ORAL_TABLET | Freq: Every day | ORAL | Status: DC
  Administered 2019-03-13 – 2019-03-14 (×2): 325 mg via ORAL
  Filled 2019-03-13 (×2): qty 1

## 2019-03-13 MED ORDER — HYDRALAZINE HCL 20 MG/ML IJ SOLN: 10.0000 mg | INTRAMUSCULAR | Status: DC | PRN

## 2019-03-13 MED ORDER — MAGNESIUM SULFATE IN D5W 1-5 GM/100ML-% IV SOLN
1.0000 g | Freq: Once | INTRAVENOUS | Status: AC
  Administered 2019-03-13: 1 g via INTRAVENOUS
  Filled 2019-03-13: qty 100

## 2019-03-13 MED ORDER — ACETAMINOPHEN 160 MG/5ML PO SOLN: 650.0000 mg | ORAL | Status: DC | PRN

## 2019-03-13 MED ORDER — DIAZEPAM 5 MG PO TABS
5.0000 mg | ORAL_TABLET | Freq: Every day | ORAL | Status: DC
  Administered 2019-03-13: 5 mg via ORAL
  Filled 2019-03-13: qty 1

## 2019-03-13 MED ORDER — ASPIRIN 81 MG PO CHEW
162.0000 mg | CHEWABLE_TABLET | Freq: Every day | ORAL | Status: DC
  Administered 2019-03-14: 162 mg via ORAL
  Filled 2019-03-13: qty 2

## 2019-03-13 MED ORDER — ACETAMINOPHEN 325 MG PO TABS
650.0000 mg | ORAL_TABLET | ORAL | Status: DC | PRN
  Administered 2019-03-14: 650 mg via ORAL
  Filled 2019-03-13: qty 2

## 2019-03-13 MED ORDER — ASPIRIN 81 MG PO CHEW: 324.0000 mg | CHEWABLE_TABLET | Freq: Every day | ORAL | Status: DC

## 2019-03-13 NOTE — ED Provider Notes (Signed)
TIME SEEN: 1:58 AM  CHIEF COMPLAINT: Visual disturbances  HPI: Patient is a 48 y.o. 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  She has had several procedures to obliterate the fistula, the most recent procedure was an embolization on December 05, 2018 by Dr. Corliss Skains who presents to the emergency department with several episodes of seeing flashes of light that last for only a second that seemed to involve both eyes.  She states this happened twice yesterday and once tonight.  She chronically has intermittent double vision that is unchanged.  No vision loss.  She denies numbness, tingling or focal weakness.  No headache, neck pain.  No fevers, cough, chest pain, shortness of breath, vomiting or diarrhea.  Her last MRI was February 07, 2019.  Please see impression below.  She states she is scheduled for another procedure in October.  MRI brain and MRA head 02/07/19:  IMPRESSION: MRI BRAIN:  Unchanged non-contrast MRI appearance of the brain since prior exam 11/22/2018. No acute intracranial abnormality.  MRA HEAD:  As compared to prior MRA 11/22/2018, abnormally prominent flow related signal at the level of the right cavernous sinus persists, but has significantly decreased.  Prominent abnormal flow related signal at the level of the left cavernous sinus is similar to slightly increased since prior MRA. The left ICA siphon is difficult to separately delineate at this level. However, the left internal carotid artery appears patent, although with somewhat diminished flow related enhancement at the paraclinoid segment.  Otherwise, no significant interval change as compared to prior MRA. An aneurysmal/dolichoectatic distal cervical right ICA is again partially imaged at the caudal extent of the field of view.  ROS: See  HPI Constitutional: no fever  Eyes: no drainage  ENT: no runny nose   Cardiovascular:  no chest pain  Resp: no SOB  GI: no vomiting GU: no dysuria Integumentary: no rash  Allergy: no hives  Musculoskeletal: no leg swelling  Neurological: no slurred speech ROS otherwise negative  PAST MEDICAL HISTORY/PAST SURGICAL HISTORY:  Past Medical History:  Diagnosis Date  . Allergic rhinitis   . Anxiety   . B12 deficiency   . Barrett's esophagus   . Chronic constipation   . 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 Depression   . GERD (gastroesophageal reflux disease)    12/04/2018- not since surgery for Hiatal Hernia  . Head injury with loss of consciousness (HCC) 05/2018  . Hiatal hernia   . History of blood transfusion 2007  . History of DVT of lower extremity    08-14-2003  RIGHT LOWER EXTREMITY-- TREATED W/ COUMADIN----  per pt none since  . History of palpitations    denies  . Hypertension   . Idiopathic chronic gout    12-20-2017  per pt stable ,   last episode 02/ 2019  . Iron deficiency anemia   . Migraines   . Personal history of PE (pulmonary embolism)    03/ 2005 RIGHT SIDE  --- treated w/ coumadin---  per pt none since  . Pneumonia 2019  . PONV (postoperative nausea and vomiting)   . Tinea versicolor   . Vitamin D deficiency     MEDICATIONS:  Prior to Admission medications   Medication Sig Start Date End Date Taking? Authorizing Provider  allopurinol (ZYLOPRIM)  100 MG tablet Take 1 tablet (100 mg total) by mouth daily. Must keep visit on 11/03/17 to get refills Patient taking differently: Take 100 mg by mouth 2 (two) times daily as needed (for gout flare up). Must keep visit on 11/03/17 to get refills 11/03/17   Plotnikov, Evie Lacks, MD  aspirin 81 MG chewable tablet Chew 1 tablet (81 mg total) by mouth daily. 11/02/18   Domenic Polite, MD  Cholecalciferol (VITAMIN D3) 2000 units TABS Take 2,000 Units by mouth daily.     [provider]  diazepam (VALIUM) 5 MG tablet Take 1 tablet (5 mg total) by mouth every 12 (twelve) hours as needed for anxiety. Patient taking differently: Take 5 mg by mouth at bedtime.  11/22/18   Plotnikov, Evie Lacks, MD  ferrous sulfate 325 (65 FE) MG tablet Take 325 mg by mouth daily with breakfast.    [provider]  hydrochlorothiazide (HYDRODIURIL) 25 MG tablet Take 1 tablet (25 mg total) by mouth daily. 11/22/18   Plotnikov, Evie Lacks, MD  KLOR-CON M20 20 MEQ tablet TAKE 1 TABLET BY MOUTH EVERY DAY Patient taking differently: Take 20 mEq by mouth daily.  11/13/18   Plotnikov, Evie Lacks, MD  Multiple Vitamin (MULTIVITAMIN WITH MINERALS) TABS tablet Take 1 tablet by mouth daily. One-A-Day Multivitamin    [provider]  polyvinyl alcohol (LIQUIFILM TEARS) 1.4 % ophthalmic solution Place 1 drop into the left eye 4 (four) times daily. Patient taking differently: Place 1 drop into the left eye 2 (two) times a day.  11/02/18   Domenic Polite, MD  promethazine (PHENERGAN) 12.5 MG tablet Take 1 tablet (12.5 mg total) by mouth every 8 (eight) hours as needed for nausea or vomiting. 02/12/19   Kathrynn Ducking, MD  timolol (TIMOPTIC) 0.5 % ophthalmic solution Place 1 drop into the left eye 2 (two) times daily. 11/02/18   Domenic Polite, MD  White Petrolatum-Mineral Oil (STYE) 31.9-57.7 % OINT Place 1 application into the left eye at bedtime.    [provider]    ALLERGIES:  Allergies  Allergen Reactions  . Guaifenesin Shortness Of Breath  . Iron Anaphylaxis and Other (See Comments)    IV only  . Doxepin Hives  . Pseudoeph-Doxylamine-Dm-Apap Hives  . Pseudoephedrine Hives  . Sulfa Antibiotics Hives  . Hydrocodone-Acetaminophen     UNSPECIFIED REACTION   . Metoprolol Succinate Swelling    UNSPECIFIED EDEMA  . Celexa [Citalopram Hydrobromide] Other (See Comments)    HEADACHE   . Codeine Other (See Comments)    tiredness  . Hydromorphone Hcl Nausea And Vomiting   . Morphine Other (See Comments)    Does not work; hallucinations and irritable  . Pantoprazole Sodium Other (See Comments)    heartburn  . Prednisone Rash  . Tetracyclines & Related Rash    SOCIAL HISTORY:  Social History   Tobacco Use  . Smoking status: Never Smoker  . Smokeless tobacco: Never Used  Substance Use Topics  . Alcohol use: Yes    Comment: rare, maybe 1 a year    FAMILY HISTORY: Family History  Problem Relation Age of Onset  . Hypertension Other   . Diabetes Other     EXAM: BP (!) 141/108 (BP Location: Right Arm)   Pulse 100   Temp (!) 97.5 F (36.4 C) (Oral)   Resp 16   SpO2 98%  CONSTITUTIONAL: Alert and oriented and responds appropriately to questions. Well-appearing; well-nourished HEAD: Normocephalic EYES: Conjunctivae clear, pupils appear equal,  EOMI, no scleral icterus, no hyphema or hypopyon, no drainage ENT: normal nose; moist mucous membranes NECK: Supple, no meningismus, no nuchal rigidity, no LAD  CARD: Regular and minimally tachycardic; S1 and S2 appreciated; no murmurs, no clicks, no rubs, no gallops RESP: Normal chest excursion without splinting or tachypnea; breath sounds clear and equal bilaterally; no wheezes, no rhonchi, no rales, no hypoxia or respiratory distress, speaking full sentences ABD/GI: Normal bowel sounds; non-distended; soft, non-tender, no rebound, no guarding, no peritoneal signs, no hepatosplenomegaly BACK:  The back appears normal and is non-tender to palpation, there is no CVA tenderness EXT: Normal ROM in all joints; non-tender to palpation; no edema; normal capillary refill; no cyanosis, no calf tenderness or swelling    SKIN: Normal color for age and race; warm; no rash NEURO: Moves all extremities equally, strength 5/5 in all 4 extremities, cranial nerves II through XII intact, normal speech, normal sensation to light touch diffusely, normal visual fields, extraocular movements intact PSYCH: The patient's mood and  manner are appropriate. Grooming and personal hygiene are appropriate.  MEDICAL DECISION MAKING: Patient here with a right cervical carotid aneurysm and left carotid cavernous fistula followed by Dr. Corliss Skains and Dr. Anne Hahn with [facility] neurology who presents today with episodes of seeing flashes of light.  No new focal neuro deficit currently.  Has diplopia which is chronic.  Discussed with Dr. Wilford Corner on-call for neurology here.  Appreciate his help.  He recommends obtaining stat CTA head and neck.  Will check visual acuity.  ED PROGRESS: 3:15 AM  Patient's potassium 2.9 without EKG changes.  Will give oral replacement.  Patient refusing hCG as she states she has had a bilateral tubal ligation.  CT is pending.  4:50 AM  D/w Dr. Phill Myron with radiology.  Patient has streak artifact from her prior coil embolization of a cavernous carotid fistula which limits assessment and unable to tell from this exam if she has continued connection.  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  She also has a new 3 mm aneurysm extending from the proximal cervical left intra-carotid artery.  She also has a new 10 x 7 mm aneurysm involving the left V2 segment.  Her distal cervical ICA aneurysms are unchanged.  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  She has interval development of multiple prominent venous collaterals throughout the right cerebral hemisphere.  Will discuss with neurology on-call.  4:55 AM  D/w Dr. Wilford Corner with neurology.  He recommends MRI brain without contrast given new dissection.  He would like to review patient's imaging prior to contacting neuro interventional radiology has we may not need to contact them emergently and can call Dr. Corliss Skains in the AM.  5:30 AM  Dr. Wilford Corner with neurology has reviewed imaging and will see patient in consult.  Recommends medicine admission and angio with Dr. Corliss Skains in AM.  He can be consulted in AM per  neurology.  Patient likely needs angio.  She will be made NPO and given full dose ASA.     6:02 AM Discussed patient's case with hospitalist, Dr. Loney Loh.  I have recommended admission and patient (and family if present) agree with this plan. Admitting physician will place admission orders.   I reviewed all nursing notes, vitals, pertinent previous records, EKGs, lab and urine results, imaging (as available).     EKG Interpretation  Date/Time:  Wednesday March 13 2019 03:29:46 EDT Ventricular Rate:  108 PR  Interval:    QRS Duration: 84 QT Interval:  349 QTC Calculation: 468 R Axis:   60 Text Interpretation:  Sinus tachycardia No significant change since last tracing other than rate is faster Confirmed by Zaine Elsass, Baxter HireKristen [phone]([id number]) on 03/13/2019 3:36:27 AM        CRITICAL CARE Performed by: Baxter HireKristen Trenae Brunke   Total critical care time: 65 minutes  Critical care time was exclusive of separately billable procedures and treating other patients.  Critical care was necessary to treat or prevent imminent or life-threatening deterioration.  Critical care was time spent personally by me on the following activities: development of treatment plan with patient and/or surrogate as well as nursing, discussions with consultants, evaluation of patient's response to treatment, examination of patient, obtaining history from patient or surrogate, ordering and performing treatments and interventions, ordering and review of laboratory studies, ordering and review of radiographic studies, pulse oximetry and re-evaluation of patient's condition.   Edwena FeltyBrandi D Sandoval was evaluated in Emergency Department on 03/13/2019 for the symptoms described in the history of present illness. She was evaluated in the context of the global COVID-19 pandemic, which necessitated consideration that the patient might be at risk for infection with the SARS-CoV-2 virus that causes COVID-19. Institutional protocols and algorithms that  pertain to the evaluation of patients at risk for COVID-19 are in a state of rapid change based on information released by regulatory bodies including the CDC and federal and state organizations. These policies and algorithms were followed during the patient's care in the ED.    Unknown Flannigan, Layla MawKristen N, DO 03/13/19 [phone]

## 2019-03-13 NOTE — ED Notes (Addendum)
Called for pt medical records from [facility].[phone]

## 2019-03-13 NOTE — ED Triage Notes (Signed)
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 Began having double vision and vomiting today. Pt sees dr Patrecia Pour and is supposed to have mri on 10/1 but symptoms have progressed. L pupil dilated larger than right, pt states this has been present since aneurism rupture. Speech clear, a/ox4, resp e/u, nad.

## 2019-03-13 NOTE — H&P (Addendum)
History and Physical    [name] ZOX:[id number] DOB: 26-Jun-1970 DOA: 03/13/2019  Referring MD/NP/PA: John Giovanni Consultants: Dr. Yvette Rack,  Dr. Deveshwar-neuroradiology, Dr. Alben Spittle -ophthalmology  PCP: Posey Rea Georgina Quint, MD  Patient coming from: Home  Chief Complaint: Zaps of light in left eye  I have personally briefly reviewed patient's old medical records in [facility]   HPI: [name] is a 48 y.o. 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  Over the last 2 days she reports having intermittent "zaps of light" lasting only second with a yellowish hue.  Patient notes that she never had these symptoms previously.  Associated symptoms include reports of head feeling "wonky" or odd and persistent double vision.  Normally patient has intermittent episodes of double vision brought on with certain eye movement lasting no more than 20 minutes since onset of symptoms back in May of this year.  Patient has had procedures with Dr.Deveshwar on 10/23/2018, 10/31/2018, and 12/05/2018 to try and obliterate the fistula.  She works at [employer] and usually needs to use to computer screens to do her job, but has been unable to really return to work due to persistence of symptoms.  ED Course: Upon admission into the emergency department patient was seen to be afebrile, pulse 95-111, blood pressure 134/98-141/108, all other vital signs maintained.  Labs significant for WBC 10.3, potassium 2.9, BUN 16, and creatinine 1.15.  Patient received full dose aspirin, 40 mEq of potassium  chloride and 1 L normal saline IV fluids.  TRH called to admit.   Review of Systems  Constitutional: Negative for chills and fever.  HENT: Negative for ear discharge and nosebleeds.   Eyes: Positive for double vision.       Positive for proptosis  Respiratory: Negative for cough and shortness of breath.   Cardiovascular: Negative for chest pain and leg swelling.  Gastrointestinal: Negative for abdominal pain, diarrhea, nausea and vomiting.  Genitourinary: Negative for dysuria and hematuria.  Musculoskeletal: Negative for joint pain and myalgias.  Neurological: Negative for focal weakness and loss of consciousness.  Psychiatric/Behavioral: Negative for substance abuse. The patient is nervous/anxious.     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---   Depression    GERD (gastroesophageal reflux disease)    12/04/2018- not since surgery for Hiatal Hernia   Head injury with loss of consciousness (HCC) 05/2018   Hiatal hernia    History of blood transfusion 2007   History of DVT of lower extremity    08-14-2003  RIGHT LOWER EXTREMITY-- TREATED W/ COUMADIN----  per pt none since   History of palpitations    denies   Hypertension    Idiopathic chronic gout    12-20-2017  per pt stable ,   last episode 02/ 2019   Iron deficiency anemia    Migraines    Personal history of PE (pulmonary embolism)    03/ 2005 RIGHT SIDE  --- treated w/ coumadin---  per pt  none since   Pneumonia 2019   PONV (postoperative nausea and vomiting)    Tinea versicolor    Vitamin D deficiency     Past Surgical History:  Procedure Laterality Date   BIOPSY  11/29/2017   Procedure: BIOPSY;  Surgeon: [name], MD;  Location: [facility];  Service: Endoscopy;;   BRONCHOSCOPY  05-20-2004  dr clance    ESOPHAGOGASTRODUODENOSCOPY (EGD) WITH PROPOFOL N/A 11/29/2017   Procedure: ESOPHAGOGASTRODUODENOSCOPY (EGD) WITH PROPOFOL;  Surgeon: [name], MD;  Location: [facility];  Service: Endoscopy;  Laterality: N/A;   EXPLORATORY LAPAROTOMY  AGE 14    [facility]   for constipation   INSERTION OF MESH N/A 12/26/2017   Procedure: INSERTION OF BIO A MESH;  Surgeon: [name], MD;  Location: [facility];  Service: General;  Laterality: N/A;   IR ANGIO INTRA EXTRACRAN SEL COM CAROTID INNOMINATE BILAT MOD SED  10/20/2018   IR ANGIO INTRA EXTRACRAN SEL COM CAROTID INNOMINATE UNI R MOD SED  10/31/2018   IR ANGIO INTRA EXTRACRAN SEL INTERNAL CAROTID UNI L MOD SED  10/23/2018   IR ANGIO INTRA EXTRACRAN SEL INTERNAL CAROTID UNI L MOD SED  10/31/2018   IR ANGIO INTRA EXTRACRAN SEL INTERNAL CAROTID UNI L MOD SED  12/05/2018   IR ANGIO VERTEBRAL SEL SUBCLAVIAN INNOMINATE UNI R MOD SED  10/20/2018   IR ANGIO VERTEBRAL SEL VERTEBRAL UNI L MOD SED  10/20/2018   IR ANGIOGRAM FOLLOW UP STUDY  10/23/2018   IR ANGIOGRAM FOLLOW UP STUDY  10/31/2018   IR ANGIOGRAM FOLLOW UP STUDY  12/05/2018   IR NEURO EACH ADD'L AFTER BASIC UNI LEFT (MS)  10/31/2018   IR TRANSCATH/EMBOLIZ  10/23/2018   IR TRANSCATH/EMBOLIZ  10/31/2018   IR TRANSCATH/EMBOLIZ  12/05/2018   IR VENO/JUGULAR RIGHT  12/05/2018   KNEE ARTHROSCOPY W/ ACL RECONSTRUCTION Right 09/2003   LAPAROSCOPY BILATERAL TUBAL FULGERATION AND ATTEMPTED THERMACHOICE ABLATION WITH UTERINE PERFORATION  07-17-2009   dr meisinger  [facility]   LUNG LOBECTOMY  1991   right lower lobe for massive hemoptyosis   MINI-THORACOTOMY WEDGE RESECTION OF LEFT LOWER LOBE CYST Left 06-24-2004   dr Edwyna [facility]   hemoptyosis   RADIOLOGY WITH ANESTHESIA N/A 10/23/2018   Procedure: IR WITH ANESTHESIA FOR EMBOLIZATION;  Surgeon: Julieanne Cotton, MD;  Location: [facility];  Service: Radiology;  Laterality: N/A;   RADIOLOGY WITH ANESTHESIA N/A 10/31/2018   Procedure: EMBOLIZATION;  Surgeon: Julieanne Cotton, MD;  Location: [facility] OR;  Service: Radiology;  Laterality: N/A;   RADIOLOGY WITH ANESTHESIA N/A 12/05/2018   Procedure: EMBOLIZATION;  Surgeon: Julieanne Cotton, MD;  Location: [facility] OR;  Service: Radiology;  Laterality: N/A;     reports that she has never smoked. She has never used smokeless tobacco. She reports current alcohol use. She reports that she does not use drugs.  Allergies  Allergen Reactions   Guaifenesin Shortness Of Breath   Iron Anaphylaxis and Other (See Comments)    IV only   Doxepin Hives   Pseudoeph-Doxylamine-Dm-Apap Hives   Pseudoephedrine Hives   Sulfa Antibiotics Hives   Hydrocodone-Acetaminophen     UNSPECIFIED REACTION    Metoprolol Succinate Swelling    UNSPECIFIED EDEMA   Celexa [Citalopram Hydrobromide] Other (See Comments)    HEADACHE    Codeine Other (See Comments)    tiredness   Hydromorphone Hcl Nausea And Vomiting   Morphine Other (See Comments)    Does not work; hallucinations and irritable   Pantoprazole Sodium Other (See Comments)  heartburn   Prednisone Rash   Tetracyclines & Related Rash    Family History  Problem Relation Age of Onset   Hypertension Other    Diabetes Other     Prior to Admission medications   Medication Sig Start Date End Date Taking? Authorizing Provider  allopurinol (ZYLOPRIM) 100 MG tablet Take 1 tablet (100 mg total) by mouth daily. Must keep visit on 11/03/17 to get refills Patient taking differently: Take 100 mg by mouth 2 (two) times daily as needed (for gout flare up). Must keep visit on 11/03/17 to get refills 11/03/17  Yes Plotnikov, Georgina Quint, MD  aspirin 81 MG chewable tablet Chew 1 tablet (81 mg total) by mouth daily. 11/02/18  Yes Zannie Cove, MD  Cholecalciferol (VITAMIN D3) 2000 units TABS Take 2,000 Units by mouth daily.    Yes [provider]  diazepam (VALIUM) 5 MG tablet Take 1 tablet (5 mg total) by mouth every 12 (twelve) hours as needed for anxiety. Patient  taking differently: Take 5 mg by mouth at bedtime.  11/22/18  Yes Plotnikov, Georgina Quint, MD  ferrous sulfate 325 (65 FE) MG tablet Take 325 mg by mouth daily with breakfast.   Yes [provider]  hydrochlorothiazide (HYDRODIURIL) 25 MG tablet Take 1 tablet (25 mg total) by mouth daily. 11/22/18  Yes Plotnikov, Georgina Quint, MD  KLOR-CON M20 20 MEQ tablet TAKE 1 TABLET BY MOUTH EVERY DAY Patient taking differently: Take 20 mEq by mouth daily.  11/13/18  Yes Plotnikov, Georgina Quint, MD  Multiple Vitamin (MULTIVITAMIN WITH MINERALS) TABS tablet Take 1 tablet by mouth daily. One-A-Day Multivitamin   Yes [provider]  polyvinyl alcohol (LIQUIFILM TEARS) 1.4 % ophthalmic solution Place 1 drop into the left eye 4 (four) times daily. Patient taking differently: Place 1 drop into the left eye 2 (two) times a day.  11/02/18  Yes Zannie Cove, MD  promethazine (PHENERGAN) 12.5 MG tablet Take 1 tablet (12.5 mg total) by mouth every 8 (eight) hours as needed for nausea or vomiting. 02/12/19  Yes York Spaniel, MD  Propylene Glycol (SYSTANE BALANCE) 0.6 % SOLN Place 1 drop into the left eye 2 (two) times daily.   Yes [provider]  timolol (TIMOPTIC) 0.5 % ophthalmic solution Place 1 drop into the left eye 2 (two) times daily. 11/02/18  Yes Zannie Cove, MD    Physical Exam:  Constitutional: thin female NAD, calm, comfortable Vitals:   03/13/19 0107 03/13/19 0230 03/13/19 0430 03/13/19 0600  BP: (!) 141/108 (!) 140/97  (!) 134/98  Pulse: 100 (!) 104 95 (!) 111  Resp: 16 15 (!) 21 16  Temp: (!) 97.5 F (36.4 C)     TempSrc: Oral     SpO2: 98% 97% 99% 99%   Eyes: PERRL, lids and conjunctivae normal ENMT: Mucous membranes are moist. Posterior pharynx clear of any exudate or lesions.  Neck: normal, supple, no masses, no thyromegaly Respiratory: clear to auscultation bilaterally, no wheezing, no crackles. Normal respiratory effort. No accessory muscle use.  Cardiovascular:  Regular rate and rhythm, no murmurs / rubs / gallops. No extremity edema. 2+ pedal pulses. No carotid bruits.  Abdomen: no tenderness, no masses palpated. No hepatosplenomegaly. Bowel sounds positive.  Musculoskeletal: no clubbing / cyanosis.  Thin extremities Skin: Tinea versicolor present Neurologic: CN 2-12 grossly intact. Sensation intact, DTR normal. Strength 5/5 in all 4.  Psychiatric: Normal judgment and insight. Alert and oriented x 3.  Anxious mood.     Labs  on Admission: I have personally reviewed following labs and imaging studies  CBC: Recent Labs  Lab 03/13/19 0209  WBC 10.3  NEUTROABS 6.8  HGB 14.8  HCT 42.7  MCV 87.5  PLT 292   Basic Metabolic Panel: Recent Labs  Lab 03/13/19 0209  NA 137  K 2.9*  CL 103  CO2 24  GLUCOSE 119*  BUN 16  CREATININE 1.15*  CALCIUM 9.4   GFR: CrCl cannot be calculated (Unknown ideal weight.). Liver Function Tests: No results for input(s): AST, ALT, ALKPHOS, BILITOT, PROT, ALBUMIN in the last 168 hours. No results for input(s): LIPASE, AMYLASE in the last 168 hours. No results for input(s): AMMONIA in the last 168 hours. Coagulation Profile: No results for input(s): INR, PROTIME in the last 168 hours. Cardiac Enzymes: No results for input(s): CKTOTAL, CKMB, CKMBINDEX, TROPONINI in the last 168 hours. BNP (last 3 results) No results for input(s): PROBNP in the last 8760 hours. HbA1C: No results for input(s): HGBA1C in the last 72 hours. CBG: No results for input(s): GLUCAP in the last 168 hours. Lipid Profile: No results for input(s): CHOL, HDL, LDLCALC, TRIG, CHOLHDL, LDLDIRECT in the last 72 hours. Thyroid Function Tests: No results for input(s): TSH, T4TOTAL, FREET4, T3FREE, THYROIDAB in the last 72 hours. Anemia Panel: No results for input(s): VITAMINB12, FOLATE, FERRITIN, TIBC, IRON, RETICCTPCT in the last 72 hours. Urine analysis:    Component Value Date/Time   COLORURINE STRAW (A) 10/25/2018 1233    APPEARANCEUR CLEAR 10/25/2018 1233   LABSPEC 1.004 (L) 10/25/2018 1233   PHURINE 7.0 10/25/2018 1233   GLUCOSEU NEGATIVE 10/25/2018 1233   GLUCOSEU NEGATIVE 10/14/2016 1033   HGBUR MODERATE (A) 10/25/2018 1233   BILIRUBINUR NEGATIVE 10/25/2018 1233   KETONESUR 5 (A) 10/25/2018 1233   PROTEINUR NEGATIVE 10/25/2018 1233   UROBILINOGEN 0.2 10/14/2016 1033   NITRITE NEGATIVE 10/25/2018 1233   LEUKOCYTESUR NEGATIVE 10/25/2018 1233   Sepsis Labs: No results found for this or any previous visit (from the past 240 hour(s)).   Radiological Exams on Admission: Ct Angio Head W Or Wo Contrast  Result Date: 03/13/2019 CLINICAL DATA:  Initial evaluation for worsening double vision, vomiting. History of CC fistula. EXAM: CT ANGIOGRAPHY HEAD AND NECK TECHNIQUE: Multidetector CT imaging of the head and neck was performed using the standard protocol during bolus administration of intravenous contrast. Multiplanar CT image reconstructions and MIPs were obtained to evaluate the vascular anatomy. Carotid stenosis measurements (when applicable) are obtained utilizing NASCET criteria, using the distal internal carotid diameter as the denominator. CONTRAST:  80mL OMNIPAQUE IOHEXOL 350 MG/ML SOLN COMPARISON:  Prior MRI/MRA from 02/07/2019 as well as previous CTA from 10/26/2018. 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 No acute intracranial hemorrhage. No acute large vessel territory infarct. No mass lesion, mass effect, or midline shift. No hydrocephalus. No extra-axial fluid collection. Scattered nonspecific cerebral white matter changes grossly stable. Vascular: No hyperdense vessel. Skull: Scalp soft tissues and calvarium within normal limits. Sinuses: Mild layering opacity noted within the left sphenoid sinus. Paranasal sinuses are otherwise clear. No mastoid effusion. Orbits: Partially  visualized globes and orbital soft tissues grossly unremarkable. CTA NECK FINDINGS Aortic arch: Visualized aortic arch normal in caliber with normal 3 vessel morphology. Mild atheromatous change at the origin of the left subclavian artery without significant stenosis. No hemodynamically significant stenosis about the origin of the great vessels. Visualized subclavian arteries widely patent.  Right carotid system: Right CCA patent from its origin to the bifurcation without stenosis or other abnormality. Irregular somewhat linear filling defect about the right bifurcation favored to be artifactual, although a possible small dissection flap not entirely excluded (series 9, image 113). No significant atheromatous stenosis about the right bifurcation. Right ICA widely patent distally to the skull base. Distal cervical ICA dolichoectatic. Associated aneurysm relatively similar measuring approximately 10 x 6 mm. Left carotid system: Left common carotid artery widely patent proximally. There is a linear dissection flap extending from the proximal through the mid left CCA, seen on prior exam, although more prominent on today's study (series 9, image 119). No significant intimal irregularity or intraluminal thrombus. Left CCA remains widely patent to the bifurcation. No significant narrowing about the left bifurcation itself. 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 Vertebral arteries: Both vertebral arteries arise from the subclavian arteries. Left vertebral artery dominant. There is a new fusiform aneurysm measuring 10 x 7 mm involving the left V2 segment at the level of C4-5 (series 8, image 230). 5 mm aneurysm involving the distal right V2 segment at the level of the C2 foramen is relatively stable (series 8, image 194). Vertebral arteries otherwise widely patent and  unchanged. Skeleton: No acute osseous finding. No discrete lytic or blastic osseous lesions Other neck: No other acute soft tissue abnormality within the neck. Upper chest: Layering fluid noted within the upper esophageal lumen. Scattered emphysematous changes noted within the lungs. Visualized upper chest demonstrates no other acute finding. Review of the MIP images confirms the above findings CTA HEAD FINDINGS Anterior circulation: Visualized petrous segments patent. Extensive streak artifacts seen about the cavernous sinus related to previous coil embolization, limiting assessment. Atheromatous plaque at the para clinoid left ICA with mild to moderate stenosis is unchanged. ICA termini well perfused. A1 segments patent and stable. Normal anterior communicating artery complex. Anterior cerebral arteries remain well opacified and patent. M1 segments widely patent. Negative MCA bifurcations. Distal MCA branches well perfused. Extensive asymmetric prominence of the venous system within the right cerebral hemisphere is now seen, presumably secondary to prior coil embolization about the cavernous sinus. Right inferior petrosal sinus dilated and prominent. Multiple prominent venous collaterals also extend cephalad along the anterior falx. Posterior circulation: Vertebral arteries widely patent to the vertebrobasilar junction and are stable in appearance. Posterior inferior cerebral arteries patent bilaterally visualized basilar patent proximally and distally without obvious stenosis, although assessment limited by streak artifact. Superior cerebral arteries patent bilaterally. Both PCAs remain patent and well opacified to their distal aspects. Venous sinuses: Major dural sinuses remain patent. Multiple new venous collaterals along the right inferior petrosal sinus and right cerebral hemisphere related to coil embolization of the CC fistula. Left superior orbital vein has been embolized. Anatomic variants: None  significant. Review of the MIP images confirms the above findings IMPRESSION: CT HEAD IMPRESSION: Stable appearance of the brain. No acute intracranial abnormality identified. CTA HEAD AND NECK IMPRESSION: 1. Extensive streak artifact from prior coil embolization of cavernous carotid fistula, limiting assessment. Evaluation for ongoing cc fistulous connection limited on this exam. 2. New dissection flap involving the proximal-mid left common carotid artery without significant narrowing. 3. 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 4. 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 5.  Unchanged distal cervical ICA aneurysms as compared to previous. 6. 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 7. Interval development of multiple prominent venous collaterals throughout the right cerebral hemisphere related to altered flow dynamics from previous coil embolization of CC fistula. Critical Value/emergent results were called by telephone at the time of interpretation on 03/13/2019 at 4:53 am to providerKRISTEN WARD , who verbally acknowledged these results. Electronically Signed   By: Rise Mu M.D.   On: 03/13/2019 04:53   Ct Angio Neck W And/or Wo Contrast  Result Date: 03/13/2019 CLINICAL DATA:  Initial evaluation for worsening double vision, vomiting. History of CC fistula. EXAM: CT ANGIOGRAPHY HEAD AND NECK TECHNIQUE: Multidetector CT imaging of the head and neck was performed using the standard protocol during bolus administration of intravenous contrast. Multiplanar CT image reconstructions and MIPs were obtained to evaluate the vascular anatomy. Carotid stenosis measurements (when applicable) are obtained utilizing NASCET criteria, using the distal internal carotid diameter as the denominator. CONTRAST:  34mL OMNIPAQUE IOHEXOL 350 MG/ML SOLN COMPARISON:  Prior MRI/MRA from 02/07/2019 as well as previous CTA from 10/26/2018. FINDINGS:  CT HEAD FINDINGS Brain: Extensive streak artifact from bilateral cavernous sinus coil embolization for known CC fistula, limiting assessment at the skull base. No acute intracranial hemorrhage. No acute large vessel territory infarct. No mass lesion, mass effect, or midline shift. No hydrocephalus. No extra-axial fluid collection. Scattered nonspecific cerebral white matter changes grossly stable. Vascular: No hyperdense vessel. Skull: Scalp soft tissues and calvarium within normal limits. Sinuses: Mild layering opacity noted within the left sphenoid sinus. Paranasal sinuses are otherwise clear. No mastoid effusion. Orbits: Partially visualized globes and orbital soft tissues grossly unremarkable. CTA NECK FINDINGS Aortic arch: Visualized aortic arch normal in caliber with normal 3 vessel morphology. Mild atheromatous change at the origin of the left subclavian artery without significant stenosis. No hemodynamically significant stenosis about the origin of the great vessels. Visualized subclavian arteries widely patent. Right carotid system: Right CCA patent from its origin to the bifurcation without stenosis or other abnormality. Irregular somewhat linear filling defect about the right bifurcation favored to be artifactual, although a possible small dissection flap not entirely excluded (series 9, image 113). No significant atheromatous stenosis about the right bifurcation. Right ICA widely patent distally to the skull base. Distal cervical ICA dolichoectatic. Associated aneurysm relatively similar measuring approximately 10 x 6 mm. Left carotid system: Left common carotid artery widely patent proximally. There is a linear dissection flap extending from the proximal through the mid left CCA, seen on prior exam, although more prominent on today's study (series 9, image 119). No significant intimal irregularity or intraluminal thrombus. Left CCA remains widely patent to the bifurcation. No significant narrowing about  the left bifurcation itself. 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 Vertebral arteries: Both vertebral arteries arise from the subclavian arteries. Left vertebral artery dominant. There is a new fusiform aneurysm measuring 10 x 7 mm involving the left V2 segment at the level of C4-5 (series 8, image 230). 5 mm aneurysm involving the distal right V2 segment at the level of the C2 foramen is relatively stable (series 8, image 194). Vertebral arteries otherwise widely patent and unchanged. Skeleton: No acute osseous finding. No discrete lytic or blastic osseous lesions Other neck: No other acute soft tissue abnormality within the neck. Upper chest: Layering fluid noted within  the upper esophageal lumen. Scattered emphysematous changes noted within the lungs. Visualized upper chest demonstrates no other acute finding. Review of the MIP images confirms the above findings CTA HEAD FINDINGS Anterior circulation: Visualized petrous segments patent. Extensive streak artifacts seen about the cavernous sinus related to previous coil embolization, limiting assessment. Atheromatous plaque at the para clinoid left ICA with mild to moderate stenosis is unchanged. ICA termini well perfused. A1 segments patent and stable. Normal anterior communicating artery complex. Anterior cerebral arteries remain well opacified and patent. M1 segments widely patent. Negative MCA bifurcations. Distal MCA branches well perfused. Extensive asymmetric prominence of the venous system within the right cerebral hemisphere is now seen, presumably secondary to prior coil embolization about the cavernous sinus. Right inferior petrosal sinus dilated and prominent. Multiple prominent venous collaterals also extend cephalad along the anterior falx. Posterior circulation: Vertebral arteries widely patent to  the vertebrobasilar junction and are stable in appearance. Posterior inferior cerebral arteries patent bilaterally visualized basilar patent proximally and distally without obvious stenosis, although assessment limited by streak artifact. Superior cerebral arteries patent bilaterally. Both PCAs remain patent and well opacified to their distal aspects. Venous sinuses: Major dural sinuses remain patent. Multiple new venous collaterals along the right inferior petrosal sinus and right cerebral hemisphere related to coil embolization of the CC fistula. Left superior orbital vein has been embolized. Anatomic variants: None significant. Review of the MIP images confirms the above findings IMPRESSION: CT HEAD IMPRESSION: Stable appearance of the brain. No acute intracranial abnormality identified. CTA HEAD AND NECK IMPRESSION: 1. Extensive streak artifact from prior coil embolization of cavernous carotid fistula, limiting assessment. Evaluation for ongoing cc fistulous connection limited on this exam. 2. New dissection flap involving the proximal-mid left common carotid artery without significant narrowing. 3. 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 4. 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 5. Unchanged distal cervical ICA aneurysms as compared to previous. 6. 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 7. Interval development of multiple prominent venous collaterals throughout the right cerebral hemisphere related to altered flow dynamics from previous coil embolization of CC fistula. Critical Value/emergent results were called by telephone at the time of interpretation on 03/13/2019 at 4:53 am to providerKRISTEN WARD , who verbally acknowledged these results. Electronically Signed   By: Rise Mu M.D.   On: 03/13/2019 04:53    EKG: Independently reviewed.  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  CT a revealed dissection flap of the proximal mid left common carotid artery with 10 mm x 7 mm fusiform aneurysm of the left P2 segment that appears to be new with other aneurysms unchanged.  Neurology consulted who thereafter consulted Dr. Corliss Skains.  Patient was given full dose aspirin.   Dr. Corliss Skains does not recommend any procedure at this time and request cards be consulted for further work-up of Marfan syndrome. -Admit to a medical telemetry -Heart healthy diet as tolerated -Neurochecks  -Follow-up MRI of the brain(negative for any acute findings) -Appreciate neurology and neuroradiology consultative services, we will follow-up for further recommendation -Office records requested from Dr. Wende Mott office to be placed in chart  Probable Marfan syndrome: Last echocardiogram from 05/2018, noted EF of 60 to 65%. -Cardiology consulted for further evaluation  Hypokalemia: Acute on chronic.  Potassium noted to be 2.9 on admission.  At home patient  on diuretic, but also on daily potassium replacement of 20 mEq.  She was given 40 mEq of potassium chloride in the ED. -Continue home potassium supplementation regimen -Continue to monitor replace as needed -Give 1 g magnesium sulfate and check magnesium levels in a.m.  Acute kidney injury: Patient's baseline creatinine previously noted to be around 0.6-0.8, but presents with creatinine elevated up to 1.15 with BUN 16.  Patient on diuretics of hydrochlorothiazide. -Gentle IV fluids at 75 mL/h -Holding nephrotoxic agents -Recheck creatinine in a.m.   Essential hypertension: On admission patient's diastolic blood pressures noted to be elevated from 98-108.  Home medications include hydrochlorothiazide. -Held hydrochlorothiazide due to AKI -Hydralazine IV as needed for elevated blood pressure  Anxiety: Patient appears anxious on physical exam. -Continue  Valium as needed at night to sleep  Underweight: BMI approximately 16.9 kg/m.  Neuroradiology recommending patient to increase weight. -Add on TSH -Check prealbumin in a.m. 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  Known to currently have right lung cyst, but not causing any problems at this time. -Continue outpatient follow-up  Gout: Patient without acute flare at this time.  Patient appears to be on allopurinol as needed. -Continue to monitor  GERD, hiatal hernia, Barrett's esophagus: Patient is status post Nelson fundoplication and not on any antiacid medicines at this time.    COVID-19 screening-negative  DVT prophylaxis: Lovenox Code Status: Full Family Communication: No family present at bedside Disposition Plan: Likely discharge home in 2 to 3 days once medically stable Consults called: Neurology and neuroradiology Admission status:inpatient status given patient's findings recurrent left carotid artery dissection and likely need for more than 2 midnight stay  Clydie Braun MD [facility] Pager [phone]   If 7PM-7AM, please contact night-coverage [email or web address] Password [email or web address]  03/13/2019, 7:10 AM

## 2019-03-13 NOTE — ED Notes (Signed)
Patient transported to MRI 

## 2019-03-13 NOTE — Consult Note (Addendum)
Neurology Consultation  Reason for Consult: Transient visual changes Referring Physician: Dr. Elesa Massed  CC: Transient visual changes, flashes of light in the left eye  History is obtained from: Patient, chart  HPI: [name] is a 48 y.o. 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 The patient reports that she was in her usual state of health but for the past 24 hours has been having flashes of light in her left eye which have been bothersome and given her history of the carotid cavernous fistula and aneurysms along with multiple procedures for obliteration of the CC fistula, she came into the ER for evaluation. She denies any changes suggestive amaurosis fugax-no feeling of a curtain falling over her vision but rather disturbance in her vision that lasts momentarily but has happened multiple times over the last 24 hours. Her neurological examination screening by the ER was unremarkable but the requested neurological consultation given her complex craniocervical vascular history. The patient does not complain of any headache at this time but does report off and on headache, which is frontal and pressure-like in sensation that lasts for an hour or 2 and resolves without any intervention. She was scheduled to get an MRI/MRA on October 1.  She is a patient of Dr. Corliss Skains from neuro interventional radiology and Dr. Anne Hahn in outpatient neurology. Denies any focal tingling numbness or weakness.  Denies any facial droop.  Denies any speech changes. Reports continuing diplopia with difficulty visualizing the screen and reading when attempting to use a computer. 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  Has been on disability since her CC fistula diagnosis and treatment. Also  followed by ophthalmology-Dr. Alben Spittle  Denies any history of being double jointed. Denies family history of Marfan's.  Denies family history of death in young.  LKW: Greater than 24 hours ago tpa given?: no, transient and intermittent symptoms Premorbid modified Rankin scale (mRS): 2  ROS:  ROS was performed and is negative except as noted in the HPI.   Past Medical History:  Diagnosis Date  . Allergic rhinitis   . Anxiety   . B12 deficiency   . Barrett's esophagus   . Chronic constipation   . 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 Depression   . GERD (gastroesophageal reflux disease)    12/04/2018- not since surgery for Hiatal Hernia  . Head injury with loss of consciousness (HCC) 05/2018  . Hiatal hernia   . History of blood transfusion 2007  . History of DVT of lower extremity    08-14-2003  RIGHT LOWER EXTREMITY-- TREATED W/ COUMADIN----  per pt none since  . History of palpitations    denies  . Hypertension   . Idiopathic chronic gout    12-20-2017  per pt stable ,   last episode 02/ 2019  . Iron deficiency anemia   . Migraines   . Personal history of PE (pulmonary embolism)    03/ 2005 RIGHT SIDE  --- treated w/ coumadin---  per pt none since  . Pneumonia 2019  . PONV (postoperative nausea and vomiting)   . Tinea versicolor   . Vitamin D deficiency     Family History  Problem Relation Age of Onset  . Hypertension Other   . Diabetes Other  Social History:   reports that she has never smoked. She has never used smokeless tobacco. She reports current alcohol use. She reports that she does not use drugs.  Medications No current facility-administered medications for this encounter.   Current Outpatient Medications:  .  allopurinol (ZYLOPRIM) 100 MG tablet, Take 1 tablet (100 mg total) by mouth daily. Must keep visit on 11/03/17 to get refills (Patient taking differently: Take 100 mg by mouth 2 (two) times daily as needed  (for gout flare up). Must keep visit on 11/03/17 to get refills), Disp: 90 tablet, Rfl: 3 .  aspirin 81 MG chewable tablet, Chew 1 tablet (81 mg total) by mouth daily., Disp: , Rfl:  .  Cholecalciferol (VITAMIN D3) 2000 units TABS, Take 2,000 Units by mouth daily. , Disp: , Rfl:  .  diazepam (VALIUM) 5 MG tablet, Take 1 tablet (5 mg total) by mouth every 12 (twelve) hours as needed for anxiety. (Patient taking differently: Take 5 mg by mouth at bedtime. ), Disp: 60 tablet, Rfl: 1 .  ferrous sulfate 325 (65 FE) MG tablet, Take 325 mg by mouth daily with breakfast., Disp: , Rfl:  .  hydrochlorothiazide (HYDRODIURIL) 25 MG tablet, Take 1 tablet (25 mg total) by mouth daily., Disp: 90 tablet, Rfl: 1 .  KLOR-CON M20 20 MEQ tablet, TAKE 1 TABLET BY MOUTH EVERY DAY (Patient taking differently: Take 20 mEq by mouth daily. ), Disp: 90 tablet, Rfl: 3 .  Multiple Vitamin (MULTIVITAMIN WITH MINERALS) TABS tablet, Take 1 tablet by mouth daily. One-A-Day Multivitamin, Disp: , Rfl:  .  polyvinyl alcohol (LIQUIFILM TEARS) 1.4 % ophthalmic solution, Place 1 drop into the left eye 4 (four) times daily. (Patient taking differently: Place 1 drop into the left eye 2 (two) times a day. ), Disp: 15 mL, Rfl: 0 .  promethazine (PHENERGAN) 12.5 MG tablet, Take 1 tablet (12.5 mg total) by mouth every 8 (eight) hours as needed for nausea or vomiting., Disp: 30 tablet, Rfl: 1 .  Propylene Glycol (SYSTANE BALANCE) 0.6 % SOLN, Place 1 drop into the left eye 2 (two) times daily., Disp: , Rfl:  .  timolol (TIMOPTIC) 0.5 % ophthalmic solution, Place 1 drop into the left eye 2 (two) times daily., Disp: 10 mL, Rfl: 1  Exam: Current vital signs: BP (!) 140/97   Pulse 95   Temp (!) 97.5 F (36.4 C) (Oral)   Resp (!) 21   SpO2 99%  Vital signs in last 24 hours: Temp:  [97.5 F (36.4 C)] 97.5 F (36.4 C) (09/23 0107) Pulse Rate:  [95-104] 95 (09/23 0430) Resp:  [15-21] 21 (09/23 0430) BP: (140-141)/(97-108) 140/97 (09/23  0230) SpO2:  [97 %-99 %] 99 % (09/23 0430) General: Awake alert in no distress HEENT: Normocephalic atraumatic CVS: Regular rate rhythm Abdomen: Soft nondistended nontender Extremities: Warm well perfused Neuro exam Mental status: Awake alert oriented x3 Speech: Not dysarthric Language: Naming repetition comprehension and fluency are intact. Cranial nerves: Right pupil is 2 mm reactive briskly, left pupil is mildly irregular, 3 mm nonreactive to light, visual fields full, facial sensation intact, face symmetric, auditory acuity intact, shoulder shrug intact, palate midline, tongue midline. Motor exam: 5/5 in all fours Sensory exam: Sensate to light touch all over without extinction Coordination: Intact finger-nose-finger and heel-knee-shin testing  NIHSS - 0   Labs I have reviewed labs in epic and the results pertinent to this consultation are:  CBC    Component Value Date/Time   WBC 10.3 03/13/2019  0209   RBC 4.88 03/13/2019 0209   HGB 14.8 03/13/2019 0209   HGB 11.5 (L) 07/27/2006 1348   HCT 42.7 03/13/2019 0209   HCT 34.5 (L) 07/27/2006 1348   PLT 292 03/13/2019 0209   PLT 355 07/27/2006 1348   MCV 87.5 03/13/2019 0209   MCV 72.7 (L) 07/27/2006 1348   MCH 30.3 03/13/2019 0209   MCHC 34.7 03/13/2019 0209   RDW 12.2 03/13/2019 0209   RDW 23.8 (H) 07/27/2006 1348   LYMPHSABS 2.3 03/13/2019 0209   LYMPHSABS 1.7 07/27/2006 1348   MONOABS 0.9 03/13/2019 0209   MONOABS 0.5 07/27/2006 1348   EOSABS 0.2 03/13/2019 0209   EOSABS 0.1 07/27/2006 1348   BASOSABS 0.0 03/13/2019 0209   BASOSABS 0.1 07/27/2006 1348    CMP     Component Value Date/Time   NA 137 03/13/2019 0209   K 2.9 (L) 03/13/2019 0209   CL 103 03/13/2019 0209   CO2 24 03/13/2019 0209   GLUCOSE 119 (H) 03/13/2019 0209   BUN 16 03/13/2019 0209   CREATININE 1.15 (H) 03/13/2019 0209   CALCIUM 9.4 03/13/2019 0209   CALCIUM 11.2 (H) 10/20/2018 0540   PROT 6.6 10/29/2018 0726   ALBUMIN 3.6 10/29/2018 0726    AST 14 (L) 10/29/2018 0726   ALT 18 10/29/2018 0726   ALKPHOS 47 10/29/2018 0726   BILITOT 1.1 10/29/2018 0726   GFRNONAA 56 (L) 03/13/2019 0209   GFRAA >60 03/13/2019 0209    Imaging I have reviewed the images obtained CT head-no acute changes  CTA head and neck- streak artifact from prior colonization of CCF limiting assessment.  New dissection flap involving the proximal mid left common carotid without significant narrowing.  New 3 mm aneurysm/pseudoaneurysm extending from the proximal cervical left ICA.  New 10 mm x 7 mm fusiform aneurysm involving the left V2 segment.  Unchanged distal cervical ICA aneurysms compared to prior.  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  Interval development of prominent venous collaterals throughout the right cerebral hemisphere related to altered flow dynamics with the previous coiling embolization of CC fistula.  Assessment:  48 year old woman with above past medical history that includes left CC fistula and right carotid aneurysm status post multiple procedure for embolization of the CC fistula presenting with multiple episodes of intermittent visual disturbance which she described as flash-like changes in her left eye, which have worsened and have been intermittent over the past 24 hours. CTA of the head and neck is concerning for few new findings compared to prior vascular studies including left common carotid dissection, and a new 10 mm vessel millimeter fusiform aneurysm in the left vertebral artery V2 segment.  Given her extensive craniocervical vascular history, and new aneurysm/dissection flap in the left ICA, these episodes could represent embolization from the aneurysm/dissection flaps.  At this point, I would recommend further work-up with an MRI as well as neuro endovascular consultation prior to proceeding with any kind of stroke work-up.  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 -Unchanged multiple other aneurysms as above. -Evaluate for stroke  Recommendations:  MR brain without contrast to evaluate for any evidence of stroke.  Aspirin 325 for now.  Anticoagulation versus antiplatelet decision after evaluation by Dr. Corliss Skains and possible arteriogram.  Neuro endovascular consult with Dr. Corliss Skains  for possible arteriogram.  Please keep the patient n.p.o.  I will inform Dr. Estanislado Pandy.  Stroke team will follow as needed.  Plan relayed to the hospitalist team, who will pass along the message to the admitting hospitalist as the patient was getting ready to go to the MRI when the hospitalist attempted to examine her for admission prior to end of shift.   -- Amie Portland, MD [facility] Pager: [phone] If 7pm to 7am, please call on call as listed on AMION.   Addendum Discussed the case Dr. Estanislado Pandy He is aware of the patient and will review images and evaluate the patient once all testing is available. -- Amie Portland, MD [facility] Pager: [phone] If 7pm to 7am, please call on call as listed on AMION.

## 2019-03-13 NOTE — Consult Note (Signed)
Chief Complaint: Patient was seen in consultation today for transient visual changes.  Referring Physician(s): Milon Dikes  Supervising Physician: Julieanne Cotton  Patient Status: [facility] - In-pt  History of Present Illness: [name] is a 48 y.o. female with a past medical history of hypertension, migraines, pulmonary embolism 2005, DVT 2005, hiatal hernia, GERD, Barrett's esophagus, iron deficiency anemia, vitamin D deficiency, B12 deficiency, gout, anxiety, and depression. She is known to [facility] and has been followed by Dr. Corliss Skains since 07/2018. She first presented to our department at the request of Dr. Anne Hahn for management of bilateral ICA aneurysms. She was planned for endovascular intervention at this time, however due to restrictions regarding COVID-19, this was postponed. She presented to [facility] ED 10/19/2018 with complaint of tinnitus and left eye pain. 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 She has undergone a 3-part endovascular staged embolization for her left ICA cavernous fistula/aneurysm by Dr. Corliss Skains (10/23/2018 using coiling and stenting, 10/31/2018 using coiling, and 12/05/2018 using coils). She has since been followed by NIR with routine imaging scans (MRI/MRA) to monitor for changes, most recent was scheduled for 03/21/2019. Has also been followed by Dr. Anne Hahn (neurology) and Dr. Alben Spittle (ophthalmology).  Patient presented to [facility] ED this AM with complaint of transient visual symptoms x 1 day. In ED, CTA revealed possibility of new aneurysm, MR negative for acute infarct. Neurology was consulted who recommended NIR consult given patient's extensive NIR history.  Patient awake and alert sitting in bed. Accompanied by sister at bedside. Patient states that on Monday she had two episodes of "zaps of light" in her left eye. States the "zaps" are yellow and last a second or two. States these two episodes occurred while she was  sitting talking to her sister, approximately 4-5 hours apart. States that Tuesday AM she had one additional episode of "zaps of light" in her left eye, again yellow and lasting a second or two. In addition, states she has known history of intermittent diplopia when looking up and to the left (and "sometimes looking to right"). States that on Monday, she had a two hour episode of diplopia and on Tuesday had diplopia that lasted most of the day. In addition, during these two days patient states that her "head felt weird, its hard to explain but I didn't feel right". Known left-sided tinnitus stable, no change. Denies neck pain during events over the past two days. Currently denies headache, weakness, numbness/tingling, blurred vision/curtain over eyes/complete blindness (also no "zaps of light" since yesterday), hearing changes, or speech difficulty.  Currently taking Aspirin 81 mg once daily.   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---   Depression    GERD (gastroesophageal reflux disease)    12/04/2018- not since surgery for Hiatal Hernia   Head injury with loss of consciousness (HCC) 05/2018   Hiatal hernia    History of blood transfusion 2007   History of DVT of lower extremity    08-14-2003  RIGHT LOWER EXTREMITY-- TREATED W/ COUMADIN----  per pt none since   History of palpitations    denies   Hypertension    Idiopathic chronic gout    12-20-2017  per pt stable ,   last episode 02/ 2019  Iron deficiency anemia    Migraines    Personal history of PE (pulmonary embolism)    03/ 2005 RIGHT SIDE  --- treated w/ coumadin---  per pt none since   Pneumonia 2019   PONV (postoperative nausea and vomiting)    Tinea versicolor    Vitamin D deficiency     Past Surgical History:  Procedure Laterality  Date   BIOPSY  11/29/2017   Procedure: BIOPSY;  Surgeon: [name], MD;  Location: [facility] ENDOSCOPY;  Service: Endoscopy;;   BRONCHOSCOPY  05-20-2004  dr clance   ESOPHAGOGASTRODUODENOSCOPY (EGD) WITH PROPOFOL N/A 11/29/2017   Procedure: ESOPHAGOGASTRODUODENOSCOPY (EGD) WITH PROPOFOL;  Surgeon: [name], MD;  Location: [facility] ENDOSCOPY;  Service: Endoscopy;  Laterality: N/A;   EXPLORATORY LAPAROTOMY  AGE 77    [facility]   for constipation   INSERTION OF MESH N/A 12/26/2017   Procedure: INSERTION OF BIO A MESH;  Surgeon: [name], MD;  Location: [facility] ORS;  Service: General;  Laterality: N/A;   IR ANGIO INTRA EXTRACRAN SEL COM CAROTID INNOMINATE BILAT MOD SED  10/20/2018   IR ANGIO INTRA EXTRACRAN SEL COM CAROTID INNOMINATE UNI R MOD SED  10/31/2018   IR ANGIO INTRA EXTRACRAN SEL INTERNAL CAROTID UNI L MOD SED  10/23/2018   IR ANGIO INTRA EXTRACRAN SEL INTERNAL CAROTID UNI L MOD SED  10/31/2018   IR ANGIO INTRA EXTRACRAN SEL INTERNAL CAROTID UNI L MOD SED  12/05/2018   IR ANGIO VERTEBRAL SEL SUBCLAVIAN INNOMINATE UNI R MOD SED  10/20/2018   IR ANGIO VERTEBRAL SEL VERTEBRAL UNI L MOD SED  10/20/2018   IR ANGIOGRAM FOLLOW UP STUDY  10/23/2018   IR ANGIOGRAM FOLLOW UP STUDY  10/31/2018   IR ANGIOGRAM FOLLOW UP STUDY  12/05/2018   IR NEURO EACH ADD'L AFTER BASIC UNI LEFT (MS)  10/31/2018   IR TRANSCATH/EMBOLIZ  10/23/2018   IR TRANSCATH/EMBOLIZ  10/31/2018   IR TRANSCATH/EMBOLIZ  12/05/2018   IR VENO/JUGULAR RIGHT  12/05/2018   KNEE ARTHROSCOPY W/ ACL RECONSTRUCTION Right 09/2003   LAPAROSCOPY BILATERAL TUBAL FULGERATION AND ATTEMPTED THERMACHOICE ABLATION WITH UTERINE PERFORATION  07-17-2009   dr meisinger  [facility]   LUNG LOBECTOMY  1991   right lower lobe for massive hemoptyosis   MINI-THORACOTOMY WEDGE RESECTION OF LEFT LOWER LOBE CYST Left 06-24-2004   dr Edwyna Shell [facility]   hemoptyosis   RADIOLOGY WITH ANESTHESIA N/A 10/23/2018   Procedure: IR WITH ANESTHESIA FOR EMBOLIZATION;  Surgeon: Julieanne Cotton, MD;  Location: [facility] OR;  Service: Radiology;  Laterality: N/A;   RADIOLOGY WITH ANESTHESIA N/A 10/31/2018   Procedure: EMBOLIZATION;  Surgeon: Julieanne Cotton, MD;  Location: [facility] OR;  Service: Radiology;  Laterality: N/A;   RADIOLOGY WITH ANESTHESIA N/A 12/05/2018   Procedure: EMBOLIZATION;  Surgeon: Julieanne Cotton, MD;  Location: [facility] OR;  Service: Radiology;  Laterality: N/A;    Allergies: Guaifenesin, Iron, Doxepin, Pseudoeph-doxylamine-dm-apap, Pseudoephedrine, Sulfa antibiotics, Hydrocodone-acetaminophen, Metoprolol succinate, Celexa [citalopram hydrobromide], Codeine, Hydromorphone hcl, Morphine, Pantoprazole sodium, Prednisone, and Tetracyclines & related  Medications: Prior to Admission medications   Medication Sig Start Date End Date Taking? Authorizing Provider  allopurinol (ZYLOPRIM) 100 MG tablet Take 1 tablet (100 mg total) by mouth daily. Must keep visit on 11/03/17 to get refills Patient taking differently: Take 100 mg by mouth 2 (two) times daily as needed (for gout flare up). Must keep visit on 11/03/17 to get refills 11/03/17  Yes Plotnikov, Georgina Quint, MD  aspirin 81 MG chewable tablet Chew 1 tablet (81 mg total) by  mouth daily. 11/02/18  Yes Zannie Cove, MD  Cholecalciferol (VITAMIN D3) 2000 units TABS Take 2,000 Units by mouth daily.    Yes [provider]  diazepam (VALIUM) 5 MG tablet Take 1 tablet (5 mg total) by mouth every 12 (twelve) hours as needed for anxiety. Patient taking differently: Take 5 mg by mouth at bedtime.  11/22/18  Yes Plotnikov, Georgina Quint, MD  ferrous sulfate 325 (65 FE) MG tablet Take 325 mg by mouth daily with breakfast.   Yes [provider]  hydrochlorothiazide (HYDRODIURIL) 25 MG tablet Take 1 tablet (25 mg total) by mouth daily. 11/22/18  Yes Plotnikov, Georgina Quint, MD  KLOR-CON M20 20 MEQ tablet TAKE 1 TABLET BY MOUTH EVERY DAY Patient taking differently: Take 20 mEq by mouth daily.  11/13/18  Yes Plotnikov, Georgina Quint, MD   Multiple Vitamin (MULTIVITAMIN WITH MINERALS) TABS tablet Take 1 tablet by mouth daily. One-A-Day Multivitamin   Yes [provider]  polyvinyl alcohol (LIQUIFILM TEARS) 1.4 % ophthalmic solution Place 1 drop into the left eye 4 (four) times daily. Patient taking differently: Place 1 drop into the left eye 2 (two) times a day.  11/02/18  Yes Zannie Cove, MD  promethazine (PHENERGAN) 12.5 MG tablet Take 1 tablet (12.5 mg total) by mouth every 8 (eight) hours as needed for nausea or vomiting. 02/12/19  Yes York Spaniel, MD  Propylene Glycol (SYSTANE BALANCE) 0.6 % SOLN Place 1 drop into the left eye 2 (two) times daily.   Yes [provider]  timolol (TIMOPTIC) 0.5 % ophthalmic solution Place 1 drop into the left eye 2 (two) times daily. 11/02/18  Yes Zannie Cove, MD     Family History  Problem Relation Age of Onset   Hypertension Other    Diabetes Other     Social History   Socioeconomic History   Marital status: Single    Spouse name: Not on file   Number of children: Not on file   Years of education: Not on file   Highest education level: Not on file  Occupational History   Not on file  Social Needs   Financial resource strain: Not on file   Food insecurity    Worry: Not on file    Inability: Not on file   Transportation needs    Medical: Not on file    Non-medical: Not on file  Tobacco Use   Smoking status: Never Smoker   Smokeless tobacco: Never Used  Substance and Sexual Activity   Alcohol use: Yes    Comment: rare, maybe 1 a year   Drug use: No   Sexual activity: Not on file    Comment: BTL  Lifestyle   Physical activity    Days per week: Not on file    Minutes per session: Not on file   Stress: Not on file  Relationships   Social connections    Talks on phone: Not on file    Gets together: Not on file    Attends religious service: Not on file    Active member of club or organization: Not on file    Attends  meetings of clubs or organizations: Not on file    Relationship status: Not on file  Other Topics Concern   Not on file  Social History Narrative   Not on file     Review of Systems: A 12 point ROS discussed and pertinent positives are indicated in the HPI above.  All other systems are  negative.  Review of Systems  Constitutional: Negative for chills and fever.  HENT: Positive for tinnitus. Negative for hearing loss.   Eyes: Positive for visual disturbance.  Respiratory: Negative for shortness of breath and wheezing.   Cardiovascular: Negative for chest pain and palpitations.  Musculoskeletal: Negative for neck pain.  Neurological: Negative for dizziness, speech difficulty, weakness, numbness and headaches.  Psychiatric/Behavioral: Negative for behavioral problems and confusion.    Vital Signs: BP (!) 134/98    Pulse (!) 111    Temp (!) 97.5 F (36.4 C) (Oral)    Resp 16    SpO2 99%   Physical Exam Vitals signs and nursing note reviewed.  Constitutional:      General: She is not in acute distress.    Appearance: Normal appearance.  Pulmonary:     Effort: Pulmonary effort is normal. No respiratory distress.  Skin:    General: Skin is warm and dry.  Neurological:     Mental Status: She is alert and oriented to person, place, and time.     Comments: Alert, awake, and oriented x3. Speech and comprehension intact. No facial asymmetry. Motor power symmetric proportional to effort.  Psychiatric:        Mood and Affect: Mood normal.        Behavior: Behavior normal.        Thought Content: Thought content normal.        Judgment: Judgment normal.      Imaging: Ct Angio Head W Or Wo Contrast  Result Date: 03/13/2019 CLINICAL DATA:  Initial evaluation for worsening double vision, vomiting. History of CC fistula. EXAM: CT ANGIOGRAPHY HEAD AND NECK TECHNIQUE: Multidetector CT imaging of the head and neck was performed using the standard protocol during bolus administration  of intravenous contrast. Multiplanar CT image reconstructions and MIPs were obtained to evaluate the vascular anatomy. Carotid stenosis measurements (when applicable) are obtained utilizing NASCET criteria, using the distal internal carotid diameter as the denominator. CONTRAST:  80mL OMNIPAQUE IOHEXOL 350 MG/ML SOLN COMPARISON:  Prior MRI/MRA from 02/07/2019 as well as previous CTA from 10/26/2018. 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 No acute intracranial hemorrhage. No acute large vessel territory infarct. No mass lesion, mass effect, or midline shift. No hydrocephalus. No extra-axial fluid collection. Scattered nonspecific cerebral white matter changes grossly stable. Vascular: No hyperdense vessel. Skull: Scalp soft tissues and calvarium within normal limits. Sinuses: Mild layering opacity noted within the left sphenoid sinus. Paranasal sinuses are otherwise clear. No mastoid effusion. Orbits: Partially visualized globes and orbital soft tissues grossly unremarkable. CTA NECK FINDINGS Aortic arch: Visualized aortic arch normal in caliber with normal 3 vessel morphology. Mild atheromatous change at the origin of the left subclavian artery without significant stenosis. No hemodynamically significant stenosis about the origin of the great vessels. Visualized subclavian arteries widely patent. Right carotid system: Right CCA patent from its origin to the bifurcation without stenosis or other abnormality. Irregular somewhat linear filling defect about the right bifurcation favored to be artifactual, although a possible small dissection flap not entirely excluded (series 9, image 113). No significant atheromatous stenosis about the right bifurcation. Right ICA widely patent distally to the skull base. Distal cervical ICA dolichoectatic. Associated aneurysm relatively similar measuring  approximately 10 x 6 mm. Left carotid system: Left common carotid artery widely patent proximally. There is a linear dissection flap extending from the proximal through the mid  left CCA, seen on prior exam, although more prominent on today's study (series 9, image 119). No significant intimal irregularity or intraluminal thrombus. Left CCA remains widely patent to the bifurcation. No significant narrowing about the left bifurcation itself. 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 Vertebral arteries: Both vertebral arteries arise from the subclavian arteries. Left vertebral artery dominant. There is a new fusiform aneurysm measuring 10 x 7 mm involving the left V2 segment at the level of C4-5 (series 8, image 230). 5 mm aneurysm involving the distal right V2 segment at the level of the C2 foramen is relatively stable (series 8, image 194). Vertebral arteries otherwise widely patent and unchanged. Skeleton: No acute osseous finding. No discrete lytic or blastic osseous lesions Other neck: No other acute soft tissue abnormality within the neck. Upper chest: Layering fluid noted within the upper esophageal lumen. Scattered emphysematous changes noted within the lungs. Visualized upper chest demonstrates no other acute finding. Review of the MIP images confirms the above findings CTA HEAD FINDINGS Anterior circulation: Visualized petrous segments patent. Extensive streak artifacts seen about the cavernous sinus related to previous coil embolization, limiting assessment. Atheromatous plaque at the para clinoid left ICA with mild to moderate stenosis is unchanged. ICA termini well perfused. A1 segments patent and stable. Normal anterior communicating artery complex. Anterior cerebral arteries remain well opacified and patent. M1 segments widely patent. Negative MCA  bifurcations. Distal MCA branches well perfused. Extensive asymmetric prominence of the venous system within the right cerebral hemisphere is now seen, presumably secondary to prior coil embolization about the cavernous sinus. Right inferior petrosal sinus dilated and prominent. Multiple prominent venous collaterals also extend cephalad along the anterior falx. Posterior circulation: Vertebral arteries widely patent to the vertebrobasilar junction and are stable in appearance. Posterior inferior cerebral arteries patent bilaterally visualized basilar patent proximally and distally without obvious stenosis, although assessment limited by streak artifact. Superior cerebral arteries patent bilaterally. Both PCAs remain patent and well opacified to their distal aspects. Venous sinuses: Major dural sinuses remain patent. Multiple new venous collaterals along the right inferior petrosal sinus and right cerebral hemisphere related to coil embolization of the CC fistula. Left superior orbital vein has been embolized. Anatomic variants: None significant. Review of the MIP images confirms the above findings IMPRESSION: CT HEAD IMPRESSION: Stable appearance of the brain. No acute intracranial abnormality identified. CTA HEAD AND NECK IMPRESSION: 1. Extensive streak artifact from prior coil embolization of cavernous carotid fistula, limiting assessment. Evaluation for ongoing cc fistulous connection limited on this exam. 2. New dissection flap involving the proximal-mid left common carotid artery without significant narrowing. 3. 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 4. 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 5. Unchanged distal cervical ICA aneurysms as compared to previous. 6. 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 7. Interval development of multiple prominent venous collaterals throughout the right cerebral hemisphere related to altered flow  dynamics from previous coil embolization of CC fistula. Critical Value/emergent results were called by telephone at the time of interpretation on 03/13/2019 at 4:53 am to [place] , who verbally acknowledged these results. Electronically Signed   By: Jeannine Boga M.D.   On: 03/13/2019 04:53   Ct Angio Neck W And/or Wo Contrast  Result Date: 03/13/2019 CLINICAL DATA:  Initial evaluation for worsening double vision, vomiting. History of CC fistula. EXAM: CT ANGIOGRAPHY HEAD AND NECK TECHNIQUE: Multidetector CT imaging of the head and neck was performed using the standard protocol during bolus administration of intravenous contrast. Multiplanar CT image reconstructions and MIPs were obtained to evaluate the vascular anatomy. Carotid stenosis measurements (when applicable) are obtained utilizing NASCET criteria, using the distal internal carotid diameter as the denominator. CONTRAST:  80mL OMNIPAQUE IOHEXOL 350 MG/ML SOLN COMPARISON:  Prior MRI/MRA from 02/07/2019 as well as previous CTA from 10/26/2018. 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 No acute intracranial hemorrhage. No acute large vessel territory infarct. No mass lesion, mass effect, or midline shift. No hydrocephalus. No extra-axial fluid collection. Scattered nonspecific cerebral white matter changes grossly stable. Vascular: No hyperdense vessel. Skull: Scalp soft tissues and calvarium within normal limits. Sinuses: Mild layering opacity noted within the left sphenoid sinus. Paranasal sinuses are otherwise clear. No mastoid effusion. Orbits: Partially visualized globes and orbital soft tissues grossly unremarkable. CTA NECK FINDINGS Aortic arch: Visualized aortic arch normal in caliber with normal 3 vessel morphology. Mild atheromatous change at the origin of the left subclavian artery without significant  stenosis. No hemodynamically significant stenosis about the origin of the great vessels. Visualized subclavian arteries widely patent. Right carotid system: Right CCA patent from its origin to the bifurcation without stenosis or other abnormality. Irregular somewhat linear filling defect about the right bifurcation favored to be artifactual, although a possible small dissection flap not entirely excluded (series 9, image 113). No significant atheromatous stenosis about the right bifurcation. Right ICA widely patent distally to the skull base. Distal cervical ICA dolichoectatic. Associated aneurysm relatively similar measuring approximately 10 x 6 mm. Left carotid system: Left common carotid artery widely patent proximally. There is a linear dissection flap extending from the proximal through the mid left CCA, seen on prior exam, although more prominent on today's study (series 9, image 119). No significant intimal irregularity or intraluminal thrombus. Left CCA remains widely patent to the bifurcation. No significant narrowing about the left bifurcation itself. 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 Vertebral arteries: Both vertebral arteries arise from the subclavian arteries. Left vertebral artery dominant. There is a new fusiform aneurysm measuring 10 x 7 mm involving the left V2 segment at the level of C4-5 (series 8, image 230). 5 mm aneurysm involving the distal right V2 segment at the level of the C2 foramen is relatively stable (series 8, image 194). Vertebral arteries otherwise widely patent and unchanged. Skeleton: No acute osseous finding. No discrete lytic or blastic osseous lesions Other neck: No other acute soft tissue abnormality within the neck. Upper chest: Layering fluid noted within the upper esophageal lumen. Scattered emphysematous changes  noted within the lungs. Visualized upper chest demonstrates no other acute finding. Review of the MIP images confirms the above findings CTA HEAD FINDINGS Anterior circulation: Visualized petrous segments patent. Extensive streak artifacts seen about the cavernous sinus related to previous coil embolization, limiting assessment. Atheromatous plaque at the para clinoid left ICA with mild to moderate stenosis is unchanged. ICA termini well perfused. A1 segments patent and stable. Normal anterior communicating artery complex. Anterior cerebral arteries remain well opacified and patent. M1 segments widely patent. Negative MCA bifurcations. Distal MCA  branches well perfused. Extensive asymmetric prominence of the venous system within the right cerebral hemisphere is now seen, presumably secondary to prior coil embolization about the cavernous sinus. Right inferior petrosal sinus dilated and prominent. Multiple prominent venous collaterals also extend cephalad along the anterior falx. Posterior circulation: Vertebral arteries widely patent to the vertebrobasilar junction and are stable in appearance. Posterior inferior cerebral arteries patent bilaterally visualized basilar patent proximally and distally without obvious stenosis, although assessment limited by streak artifact. Superior cerebral arteries patent bilaterally. Both PCAs remain patent and well opacified to their distal aspects. Venous sinuses: Major dural sinuses remain patent. Multiple new venous collaterals along the right inferior petrosal sinus and right cerebral hemisphere related to coil embolization of the CC fistula. Left superior orbital vein has been embolized. Anatomic variants: None significant. Review of the MIP images confirms the above findings IMPRESSION: CT HEAD IMPRESSION: Stable appearance of the brain. No acute intracranial abnormality identified. CTA HEAD AND NECK IMPRESSION: 1. Extensive streak artifact from prior coil embolization of  cavernous carotid fistula, limiting assessment. Evaluation for ongoing cc fistulous connection limited on this exam. 2. New dissection flap involving the proximal-mid left common carotid artery without significant narrowing. 3. 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 4. 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 5. Unchanged distal cervical ICA aneurysms as compared to previous. 6. 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 7. Interval development of multiple prominent venous collaterals throughout the right cerebral hemisphere related to altered flow dynamics from previous coil embolization of CC fistula. Critical Value/emergent results were called by telephone at the time of interpretation on 03/13/2019 at 4:53 am to providerKRISTEN WARD , who verbally acknowledged these results. Electronically Signed   By: Rise Mu M.D.   On: 03/13/2019 04:53   Mr Brain Wo Contrast  Result Date: 03/13/2019 CLINICAL DATA:  History of carotid cavernous fistula. Carotid artery dissection. Evaluate for infarct. EXAM: MRI HEAD WITHOUT CONTRAST TECHNIQUE: Multiplanar, multiecho pulse sequences of the brain and surrounding structures were obtained without intravenous contrast. COMPARISON:  02/07/2019 FINDINGS: Brain: No acute infarction, hemorrhage, hydrocephalus, extra-axial collection or mass lesion. Few remote white matter insults are stable and mainly periventricular. Normal brain volume Vascular: Abnormal number of asymmetric vessels along the surface of the right brain as recently evaluated by CTA. Bulbous cervical and cavernous ICA, reference prior CTA. Skull and upper cervical spine: Negative for marrow lesion Sinuses/Orbits: Negative IMPRESSION: Negative for acute infarct or other interval finding. Electronically Signed   By: Marnee Spring M.D.   On: 03/13/2019 07:33    Labs:  CBC: Recent Labs    12/05/18 0740 12/05/18 1319  12/05/18 1321 12/05/18 1651 12/06/18 0435 03/13/19 0209  WBC 11.9* 11.6*  --   --  16.3* 10.3  HGB 13.4 8.5* 8.2* 8.5* 8.3* 14.8  HCT 41.0 25.7* 24.0* 25.0* 25.2* 42.7  PLT 321 228  --   --  248 292    COAGS: Recent Labs    10/22/18 0923 10/29/18 0726 10/31/18 0530 12/05/18 0740 12/05/18 1319  INR 1.0 1.1 1.1 1.0  --   APTT  --   --   --  32 >200*    BMP: Recent Labs    11/01/18 0443 12/05/18 0740 12/05/18 1321 12/05/18 1651 12/06/18 0435 03/13/19 0209  NA 139 139 143 144 139 137  K 3.3* 3.7 3.3* 3.1* 3.4* 2.9*  CL 107 105  --  112*  112* 103  CO2 25 24  --   --  20* 24  GLUCOSE 86 86 148* 128* 107* 119*  BUN 11 16  --  9 8 16   CALCIUM 8.4* 9.8  --   --  8.6* 9.4  CREATININE 0.90 0.75  --  0.40* 0.67 1.15*  GFRNONAA >60 >60  --   --  >60 56*  GFRAA >60 >60  --   --  >60 >60    LIVER FUNCTION TESTS: Recent Labs    07/11/18 1300 10/19/18 1837 10/26/18 1129 10/29/18 0726  BILITOT 1.5* 1.4* 1.4* 1.1  AST 45* 25 35 14*  ALT 13 12 32 18  ALKPHOS 49 57 49 47  PROT 7.6 8.6* 7.6 6.6  ALBUMIN 4.3 5.2* 4.1 3.6     Assessment and Plan:  Transient visual symptoms. 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 Corliss Skains, s/p staged embolization using coiling 10/31/2018 by Dr. Corliss Skains, s/pstaged embolization of right carotid cavernous sinus region filling via the intra cavernous channels from the left cavernous sinus using coils 12/05/2018 by Dr. Corliss Skains. Discussed findings of imaging scan. Explained that there is no evidence of stroke on MR. Explained that everything known is stable (left carotid fistula, bilateral ICA aneurysms, right VA aneurysm). Explained that there is a suspicion of a dilation of left VA- not seen on prior exams. However, these findings would not explain her symptoms. Because of this, do not recommend diagnostic cerebral arteriogram at this time (will not offer [place] any  new information per Dr. Corliss Skains).  Discussed connective tissue work-up. Patient states that she has started doing research on her own to find a genetic specialist to complete this work-up. States she has had phone conversations with both [facility] and [facility], but no appointment has been made to be seen.  Recommendations: - Begin taking Aspirin 81 mg twice daily (ok to take both at one time). - Admit to [facility] for expedited connective tissue work-up (probable [place]). - Cardiology consult for possible echocardiogram (as part of connective tissue work-up). - No plan for further NIR intervention at this time- patient needs to gain weight (current BMI 17.1, high risk of complications with anesthesia) and complete connective tissue work-up prior to intervention, next time patient goes for cerebral arteriogram will be with intent to treat left ICA fistula.  NIR to follow.   Thank you for this interesting consult.  I greatly enjoyed meeting Kani D Darco and look forward to participating in their care.  A copy of this report was sent to the requesting provider on this date.  Electronically Signed: Elwin Mocha, PA-C 03/13/2019, 10:30 AM   I spent a total of 40 Minutes in face to face in clinical consultation, greater than 50% of which was counseling/coordinating care for transient visual changes.

## 2019-03-13 NOTE — ED Notes (Signed)
Patient transported to CT 

## 2019-03-13 NOTE — Progress Notes (Signed)
Patient welcomed to unit. Telemetry placed. Notified of unit policies and oriented to room. Call light placed within reach. [name]

## 2019-03-13 NOTE — Consult Note (Signed)
Cardiology Consultation:   Patient ID: [name]; [id number]; [birth year]   Admit date: 03/13/2019 Date of Consult: 03/13/2019  Primary Care Provider: Tresa Garter, MD Primary Cardiologist: Chilton Si, MD New Primary Electrophysiologist:  None   Patient Profile:   GRISELL Gordon is a 48 y.o. 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  History of Present Illness:   [name] was seen by Dr Anne Hahn 08/25, was told the L carotid cavernous fistula is enlarging>>she will need a 4th procedure to obliterate it.   She came to the ER today for flashes of light and visual changes L eye plus nausea and vomiting. Neuro has seen, she likely needs angio. However, Dr Percell Locus colleague and consulted w/ him. They feel that there is no acute problem. MRI findings do not explain sx. Add ASA 81 mg qd.   Dr Drusilla Kanner has done her endovascular interventions, he has suggested that she may have Marfan's or Ehlers-Danos syndrome. She has researched this and has contacted [facility] and [facility]. Appointments are 6-9 months out and blood work takes about 60 days to get results. She does not currently have an appointment.   [name] feels her health is not very good.  **She was a preemie, born at 26 weeks, at 2 lbs 6 oz and dropped to < 2 lbs while in the incubator (was there a couple of months).   **She developed lung cysts, the first time at 48 yo, requiring 2 surgeries. The last surgery was in 2016. She currently has another lung cyst, thinks it is 2.5 cm or so, they are watching it. ** She had Jakai Risse's esophagus and HH>>has no problems since the Nissen procedure. ** Tinea  versicolor is chronic ** Has had problems with anemia, she says cause unknown. However, upon review of records, blood counts are ok except after procedures, when she has had problems w/ blood loss at the sheath sites, hematomas. Otherwise, Hgb is 11 or higher.   [name] is currently resting comfortably. Her activity level is very low at baseline. She does not like to climb stairs, she had some friends fall and get injured, so she avoids steps. She does not walk very much. She will do light housework such as dishes and laundry, but nothing more strenuous. She does not get chest pain or SOB w/ activity. She feels she could walk a few blocks if she had to.   Because of her vision problems, she is not currently able to work.    Past Medical History:  Diagnosis Date   Allergic rhinitis    Anxiety    B12 deficiency    [name]'s esophagus    Chronic constipation    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---   Depression    GERD (gastroesophageal reflux disease)    12/04/2018- not since surgery for Hiatal Hernia   Head injury with loss of consciousness (HCC) 05/2018   Hiatal hernia    History of blood transfusion 2007   History of DVT of lower extremity    08-14-2003  RIGHT LOWER EXTREMITY-- TREATED W/ COUMADIN----  per pt none since   History of palpitations    denies  Hypertension    Idiopathic chronic gout    12-20-2017  per pt stable ,   last episode 02/ 2019   Iron deficiency anemia    Migraines    Personal history of PE (pulmonary embolism)    03/ 2005 RIGHT SIDE  --- treated w/ coumadin---  per pt none since   Pneumonia 2019   PONV (postoperative nausea and vomiting)    Tinea versicolor    Vitamin D deficiency     Past Surgical History:  Procedure Laterality Date   BIOPSY  11/29/2017   Procedure: BIOPSY;  Surgeon: [name], MD;  Location: [facility];  Service: Endoscopy;;   BRONCHOSCOPY  05-20-2004  dr clance    ESOPHAGOGASTRODUODENOSCOPY (EGD) WITH PROPOFOL N/A 11/29/2017   Procedure: ESOPHAGOGASTRODUODENOSCOPY (EGD) WITH PROPOFOL;  Surgeon: [name], MD;  Location: [facility];  Service: Endoscopy;  Laterality: N/A;   EXPLORATORY LAPAROTOMY  AGE 32    [facility]   for constipation   INSERTION OF MESH N/A 12/26/2017   Procedure: INSERTION OF BIO A MESH;  Surgeon: [name], MD;  Location: [facility];  Service: General;  Laterality: N/A;   IR ANGIO INTRA EXTRACRAN SEL COM CAROTID INNOMINATE BILAT MOD SED  10/20/2018   IR ANGIO INTRA EXTRACRAN SEL COM CAROTID INNOMINATE UNI R MOD SED  10/31/2018   IR ANGIO INTRA EXTRACRAN SEL INTERNAL CAROTID UNI L MOD SED  10/23/2018   IR ANGIO INTRA EXTRACRAN SEL INTERNAL CAROTID UNI L MOD SED  10/31/2018   IR ANGIO INTRA EXTRACRAN SEL INTERNAL CAROTID UNI L MOD SED  12/05/2018   IR ANGIO VERTEBRAL SEL SUBCLAVIAN INNOMINATE UNI R MOD SED  10/20/2018   IR ANGIO VERTEBRAL SEL VERTEBRAL UNI L MOD SED  10/20/2018   IR ANGIOGRAM FOLLOW UP STUDY  10/23/2018   IR ANGIOGRAM FOLLOW UP STUDY  10/31/2018   IR ANGIOGRAM FOLLOW UP STUDY  12/05/2018   IR NEURO EACH ADD'L AFTER BASIC UNI LEFT (MS)  10/31/2018   IR TRANSCATH/EMBOLIZ  10/23/2018   IR TRANSCATH/EMBOLIZ  10/31/2018   IR TRANSCATH/EMBOLIZ  12/05/2018   IR VENO/JUGULAR RIGHT  12/05/2018   KNEE ARTHROSCOPY W/ ACL RECONSTRUCTION Right 09/2003   LAPAROSCOPY BILATERAL TUBAL FULGERATION AND ATTEMPTED THERMACHOICE ABLATION WITH UTERINE PERFORATION  07-17-2009   dr meisinger  [facility]   LUNG LOBECTOMY  1991   right lower lobe for massive hemoptyosis   MINI-THORACOTOMY WEDGE RESECTION OF LEFT LOWER LOBE CYST Left 06-24-2004   dr Edwyna Shell [facility]   hemoptyosis   RADIOLOGY WITH ANESTHESIA N/A 10/23/2018   Procedure: IR WITH ANESTHESIA FOR EMBOLIZATION;  Surgeon: Julieanne Cotton, MD;  Location: [facility];  Service: Radiology;  Laterality: N/A;   RADIOLOGY WITH ANESTHESIA N/A 10/31/2018   Procedure: EMBOLIZATION;  Surgeon: Julieanne Cotton, MD;  Location: [facility] OR;  Service: Radiology;  Laterality: N/A;   RADIOLOGY WITH ANESTHESIA N/A 12/05/2018   Procedure: EMBOLIZATION;  Surgeon: Julieanne Cotton, MD;  Location: [facility];  Service: Radiology;  Laterality: N/A;     Prior to Admission medications   Medication Sig Start Date End Date Taking? Authorizing Provider  allopurinol (ZYLOPRIM) 100 MG tablet Take 1 tablet (100 mg total) by mouth daily. Must keep visit on 11/03/17 to get refills Patient taking differently: Take 100 mg by mouth 2 (two) times daily as needed (for gout flare up). Must keep visit on 11/03/17 to get refills 11/03/17  Yes Plotnikov, Georgina Quint, MD  aspirin 81 MG chewable tablet Chew 1 tablet (81 mg total) by mouth daily.  11/02/18  Yes Zannie Cove, MD  Cholecalciferol (VITAMIN D3) 2000 units TABS Take 2,000 Units by mouth daily.    Yes [provider]  diazepam (VALIUM) 5 MG tablet Take 1 tablet (5 mg total) by mouth every 12 (twelve) hours as needed for anxiety. Patient taking differently: Take 5 mg by mouth at bedtime.  11/22/18  Yes Plotnikov, Georgina Quint, MD  ferrous sulfate 325 (65 FE) MG tablet Take 325 mg by mouth daily with breakfast.   Yes [provider]  hydrochlorothiazide (HYDRODIURIL) 25 MG tablet Take 1 tablet (25 mg total) by mouth daily. 11/22/18  Yes Plotnikov, Georgina Quint, MD  KLOR-CON M20 20 MEQ tablet TAKE 1 TABLET BY MOUTH EVERY DAY Patient taking differently: Take 20 mEq by mouth daily.  11/13/18  Yes Plotnikov, Georgina Quint, MD  Multiple Vitamin (MULTIVITAMIN WITH MINERALS) TABS tablet Take 1 tablet by mouth daily. One-A-Day Multivitamin   Yes [provider]  polyvinyl alcohol (LIQUIFILM TEARS) 1.4 % ophthalmic solution Place 1 drop into the left eye 4 (four) times daily. Patient taking differently: Place 1 drop into the left eye 2 (two) times a day.  11/02/18  Yes Zannie Cove, MD  promethazine (PHENERGAN) 12.5 MG tablet Take 1 tablet (12.5 mg total) by mouth every 8  (eight) hours as needed for nausea or vomiting. 02/12/19  Yes York Spaniel, MD  Propylene Glycol (SYSTANE BALANCE) 0.6 % SOLN Place 1 drop into the left eye 2 (two) times daily.   Yes [provider]  timolol (TIMOPTIC) 0.5 % ophthalmic solution Place 1 drop into the left eye 2 (two) times daily. 11/02/18  Yes Zannie Cove, MD    Inpatient Medications: Scheduled Meds:   stroke: mapping our early stages of recovery book   Does not apply Once   [START ON 03/14/2019] aspirin  162 mg Oral Daily   diazepam  5 mg Oral QHS   enoxaparin (LOVENOX) injection  40 mg Subcutaneous Q24H   feeding supplement  1 Container Oral TID BM   ferrous sulfate  325 mg Oral Q breakfast   potassium chloride SA  20 mEq Oral Daily   Propylene Glycol  1 drop Left Eye BID   timolol  1 drop Left Eye BID   Continuous Infusions:  sodium chloride 75 mL/hr at 03/13/19 0919   PRN Meds: acetaminophen **OR** acetaminophen (TYLENOL) oral liquid 160 mg/5 mL **OR** acetaminophen, hydrALAZINE, polyvinyl alcohol, promethazine  Allergies:    Allergies  Allergen Reactions   Guaifenesin Shortness Of Breath   Iron Anaphylaxis and Other (See Comments)    IV only   Doxepin Hives   Pseudoeph-Doxylamine-Dm-Apap Hives   Pseudoephedrine Hives   Sulfa Antibiotics Hives   Hydrocodone-Acetaminophen     UNSPECIFIED REACTION    Metoprolol Succinate Swelling    UNSPECIFIED EDEMA   Celexa [Citalopram Hydrobromide] Other (See Comments)    HEADACHE    Codeine Other (See Comments)    tiredness   Hydromorphone Hcl Nausea And Vomiting   Morphine Other (See Comments)    Does not work; hallucinations and irritable   Pantoprazole Sodium Other (See Comments)    heartburn   Prednisone Rash   Tetracyclines & Related Rash    Social History:   Social History   Socioeconomic History   Marital status: Single    Spouse name: Not on file   Number of children: Not on file   Years of education:  Not on file   Highest education level: Not on file  Occupational History   Not on file  Social Needs   Financial resource strain: Not on file   Food insecurity    Worry: Not on file    Inability: Not on file   Transportation needs    Medical: Not on file    Non-medical: Not on file  Tobacco Use   Smoking status: Never Smoker   Smokeless tobacco: Never Used  Substance and Sexual Activity   Alcohol use: Yes    Comment: rare, maybe 1 a year   Drug use: No   Sexual activity: Not on file    Comment: BTL  Lifestyle   Physical activity    Days per week: Not on file    Minutes per session: Not on file   Stress: Not on file  Relationships   Social connections    Talks on phone: Not on file    Gets together: Not on file    Attends religious service: Not on file    Active member of club or organization: Not on file    Attends meetings of clubs or organizations: Not on file    Relationship status: Not on file   Intimate partner violence    Fear of current or ex partner: Not on file    Emotionally abused: Not on file    Physically abused: Not on file    Forced sexual activity: Not on file  Other Topics Concern   Not on file  Social History Narrative   She lives with her sister.    Family History:   Family History  Problem Relation Age of Onset   Hypertension Other    Diabetes Other    Diverticulitis Mother    COPD Father    Sudden death Neg Hx    Family Status:  Family Status  Relation Name Status   Other  (Not Specified)   Mother  Deceased   Father  Deceased   MGM  Deceased   MGF  Deceased   PGM  Deceased   PGF  Deceased   Neg Hx  (Not Specified)    ROS:  Please see the history of present illness.  All other ROS reviewed and negative.     Physical Exam/Data:   Vitals:   03/13/19 1100 03/13/19 1115 03/13/19 1145 03/13/19 1245  BP: 132/89 (!) 125/91 (!) 133/93 134/88  Pulse: 96 99 98 93  Resp: 16 14 (!) 22 12  Temp:        TempSrc:      SpO2: 96% 96% 97% 99%    Intake/Output Summary (Last 24 hours) at 03/13/2019 1404 Last data filed at 03/13/2019 1020 Gross per 24 hour  Intake 1100 ml  Output --  Net 1100 ml   There were no vitals filed for this visit. There is no height or weight on file to calculate BMI.  General:  Well nourished, well developed, female in no acute distress HEENT: normal Lymph: no adenopathy Neck: JVD - not elevated Endocrine:  No thryomegaly Vascular: No carotid bruits; 4/4 extremity pulses 2+, without bruits  Cardiac:  normal S1, S2; RRR; no murmur  Lungs:  clear bilaterally, no wheezing, rhonchi or rales  Abd: soft, nontender, no hepatomegaly  Ext: no edema Musculoskeletal:  No deformities, BUE and BLE strength normal and equal Skin: warm and dry  Neuro:  CNs 2-12 intact, no focal abnormalities noted Psych:  Normal affect   EKG:  The EKG was personally reviewed and demonstrates:  ST, HR 108, no acute  ischemic changes Telemetry:  Telemetry was personally reviewed and demonstrates:  SR, ST   CV studies:   ECHO: 06/14/2018 - Left ventricle: The cavity size was normal. There was mild focal   basal hypertrophy of the septum. Systolic function was normal.   The estimated ejection fraction was in the range of 60% to 65%.   Images were inadequate for LV wall motion assessment. Left   ventricular diastolic function parameters were normal. No   definite LVOT flow acceleration or evidence of obstruction. No   systolic anterior motion detected. Imaging suboptimal for   detection of mid-cavitary gradients. - Aortic valve: Transvalvular velocity was within the normal range.   There was no stenosis. There was no regurgitation. - Mitral valve: Transvalvular velocity was within the normal range.   There was no evidence for stenosis. There was trivial   regurgitation. - Left atrium: The atrium was normal in size. - Right ventricle: The cavity size was normal. Wall thickness was    normal. Systolic function was normal. - Right atrium: The atrium was normal in size. - Atrial septum: No defect or patent foramen ovale was identified. - Tricuspid valve: There was trivial regurgitation. - Inferior vena cava: The vessel was normal in size. The   respirophasic diameter changes were in the normal range (= 50%),   consistent with normal central venous pressure. - Pericardium, extracardiac: There was no pericardial effusion.  Impressions:  - Normal biventricular function, LVEF 60-65%. Images inadequate for   the assessment of subtle regional wall motion abnormalities,   grossly normal wall motion. No evidence of LVOT obstruction or   systolic anterior motion of the mitral valve. No hemodynamically   significant valvular heart disease. IVC appears underfilled and   collapses easily.   Laboratory Data:   Chemistry Recent Labs  Lab 03/13/19 0209  NA 137  K 2.9*  CL 103  CO2 24  GLUCOSE 119*  BUN 16  CREATININE 1.15*  CALCIUM 9.4  GFRNONAA 56*  GFRAA >60  ANIONGAP 10    Lab Results  Component Value Date   ALT 18 10/29/2018   AST 14 (L) 10/29/2018   ALKPHOS 47 10/29/2018   BILITOT 1.1 10/29/2018   Hematology Recent Labs  Lab 03/13/19 0209  WBC 10.3  RBC 4.88  HGB 14.8  HCT 42.7  MCV 87.5  MCH 30.3  MCHC 34.7  RDW 12.2  PLT 292   Cardiac Enzymes High Sensitivity Troponin:  No results for input(s): TROPONINIHS in the last 720 hours.    Other Troponin:No results for input(s): TROPONINI in the last 168 hours. No results for input(s): TROPIPOC in the last 168 hours.  BNPNo results for input(s): BNP, PROBNP in the last 168 hours.  DDimer No results for input(s): DDIMER in the last 168 hours. TSH:  Lab Results  Component Value Date   TSH 3.54 09/15/2015   Lipids: Lab Results  Component Value Date   CHOL 222 (H) 11/28/2017   HDL 37 (L) 11/28/2017   LDLCALC 152 (H) 11/28/2017   LDLDIRECT 173.9 04/09/2009   TRIG 74 12/07/2018   CHOLHDL 6.0  11/28/2017   HgbA1c:No results found for: HGBA1C Magnesium:  Magnesium  Date Value Ref Range Status  10/25/2018 1.8 1.7 - 2.4 mg/dL Final    Comment:    Performed at [facility], [address] [place], [place] [address]     Radiology/Studies:  Ct Angio Head W Or Wo Contrast  Result Date: 03/13/2019 CLINICAL DATA:  Initial  evaluation for worsening double vision, vomiting. History of CC fistula. EXAM: CT ANGIOGRAPHY HEAD AND NECK TECHNIQUE: Multidetector CT imaging of the head and neck was performed using the standard protocol during bolus administration of intravenous contrast. Multiplanar CT image reconstructions and MIPs were obtained to evaluate the vascular anatomy. Carotid stenosis measurements (when applicable) are obtained utilizing NASCET criteria, using the distal internal carotid diameter as the denominator. CONTRAST:  80mL OMNIPAQUE IOHEXOL 350 MG/ML SOLN COMPARISON:  Prior MRI/MRA from 02/07/2019 as well as previous CTA from 10/26/2018. 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 No acute intracranial hemorrhage. No acute large vessel territory infarct. No mass lesion, mass effect, or midline shift. No hydrocephalus. No extra-axial fluid collection. Scattered nonspecific cerebral white matter changes grossly stable. Vascular: No hyperdense vessel. Skull: Scalp soft tissues and calvarium within normal limits. Sinuses: Mild layering opacity noted within the left sphenoid sinus. Paranasal sinuses are otherwise clear. No mastoid effusion. Orbits: Partially visualized globes and orbital soft tissues grossly unremarkable. CTA NECK FINDINGS Aortic arch: Visualized aortic arch normal in caliber with normal 3 vessel morphology. Mild atheromatous change at the origin of the left subclavian artery without significant stenosis. No hemodynamically significant stenosis  about the origin of the great vessels. Visualized subclavian arteries widely patent. Right carotid system: Right CCA patent from its origin to the bifurcation without stenosis or other abnormality. Irregular somewhat linear filling defect about the right bifurcation favored to be artifactual, although a possible small dissection flap not entirely excluded (series 9, image 113). No significant atheromatous stenosis about the right bifurcation. Right ICA widely patent distally to the skull base. Distal cervical ICA dolichoectatic. Associated aneurysm relatively similar measuring approximately 10 x 6 mm. Left carotid system: Left common carotid artery widely patent proximally. There is a linear dissection flap extending from the proximal through the mid left CCA, seen on prior exam, although more prominent on today's study (series 9, image 119). No significant intimal irregularity or intraluminal thrombus. Left CCA remains widely patent to the bifurcation. No significant narrowing about the left bifurcation itself. 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 Vertebral arteries: Both vertebral arteries arise from the subclavian arteries. Left vertebral artery dominant. There is a new fusiform aneurysm measuring 10 x 7 mm involving the left V2 segment at the level of C4-5 (series 8, image 230). 5 mm aneurysm involving the distal right V2 segment at the level of the C2 foramen is relatively stable (series 8, image 194). Vertebral arteries otherwise widely patent and unchanged. Skeleton: No acute osseous finding. No discrete lytic or blastic osseous lesions Other neck: No other acute soft tissue abnormality within the neck. Upper chest: Layering fluid noted within the upper esophageal lumen. Scattered emphysematous changes noted within the lungs. Visualized upper chest  demonstrates no other acute finding. Review of the MIP images confirms the above findings CTA HEAD FINDINGS Anterior circulation: Visualized petrous segments patent. Extensive streak artifacts seen about the cavernous sinus related to previous coil embolization, limiting assessment. Atheromatous plaque at the para clinoid left ICA with mild to moderate stenosis is unchanged. ICA termini well perfused. A1 segments patent and stable. Normal anterior communicating artery complex. Anterior cerebral arteries remain well opacified and patent. M1 segments widely patent. Negative MCA bifurcations. Distal MCA branches  well perfused. Extensive asymmetric prominence of the venous system within the right cerebral hemisphere is now seen, presumably secondary to prior coil embolization about the cavernous sinus. Right inferior petrosal sinus dilated and prominent. Multiple prominent venous collaterals also extend cephalad along the anterior falx. Posterior circulation: Vertebral arteries widely patent to the vertebrobasilar junction and are stable in appearance. Posterior inferior cerebral arteries patent bilaterally visualized basilar patent proximally and distally without obvious stenosis, although assessment limited by streak artifact. Superior cerebral arteries patent bilaterally. Both PCAs remain patent and well opacified to their distal aspects. Venous sinuses: Major dural sinuses remain patent. Multiple new venous collaterals along the right inferior petrosal sinus and right cerebral hemisphere related to coil embolization of the CC fistula. Left superior orbital vein has been embolized. Anatomic variants: None significant. Review of the MIP images confirms the above findings IMPRESSION: CT HEAD IMPRESSION: Stable appearance of the brain. No acute intracranial abnormality identified. CTA HEAD AND NECK IMPRESSION: 1. Extensive streak artifact from prior coil embolization of cavernous carotid fistula, limiting assessment.  Evaluation for ongoing cc fistulous connection limited on this exam. 2. New dissection flap involving the proximal-mid left common carotid artery without significant narrowing. 3. 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 4. 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 5. Unchanged distal cervical ICA aneurysms as compared to previous. 6. 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 7. Interval development of multiple prominent venous collaterals throughout the right cerebral hemisphere related to altered flow dynamics from previous coil embolization of CC fistula. Critical Value/emergent results were called by telephone at the time of interpretation on 03/13/2019 at 4:53 am to providerKRISTEN WARD , who verbally acknowledged these results. Electronically Signed   By: Rise Mu M.D.   On: 03/13/2019 04:53   Ct Angio Neck W And/or Wo Contrast  Result Date: 03/13/2019 CLINICAL DATA:  Initial evaluation for worsening double vision, vomiting. History of CC fistula. EXAM: CT ANGIOGRAPHY HEAD AND NECK TECHNIQUE: Multidetector CT imaging of the head and neck was performed using the standard protocol during bolus administration of intravenous contrast. Multiplanar CT image reconstructions and MIPs were obtained to evaluate the vascular anatomy. Carotid stenosis measurements (when applicable) are obtained utilizing NASCET criteria, using the distal internal carotid diameter as the denominator. CONTRAST:  86mL OMNIPAQUE IOHEXOL 350 MG/ML SOLN COMPARISON:  Prior MRI/MRA from 02/07/2019 as well as previous CTA from 10/26/2018. 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 No acute intracranial hemorrhage. No acute large vessel territory infarct. No mass lesion, mass effect, or midline shift. No hydrocephalus. No extra-axial  fluid collection. Scattered nonspecific cerebral white matter changes grossly stable. Vascular: No hyperdense vessel. Skull: Scalp soft tissues and calvarium within normal limits. Sinuses: Mild layering opacity noted within the left sphenoid sinus. Paranasal sinuses are otherwise clear. No mastoid effusion. Orbits: Partially visualized globes and orbital soft tissues grossly unremarkable. CTA NECK FINDINGS Aortic arch: Visualized aortic arch normal in caliber with normal 3 vessel morphology. Mild atheromatous change at the origin of the left subclavian artery without significant stenosis. No hemodynamically significant stenosis about the origin of the great vessels. Visualized subclavian arteries widely patent. Right carotid system: Right CCA patent from its origin to the bifurcation without stenosis or other abnormality. Irregular somewhat linear filling defect about the right bifurcation favored to be artifactual, although a possible  small dissection flap not entirely excluded (series 9, image 113). No significant atheromatous stenosis about the right bifurcation. Right ICA widely patent distally to the skull base. Distal cervical ICA dolichoectatic. Associated aneurysm relatively similar measuring approximately 10 x 6 mm. Left carotid system: Left common carotid artery widely patent proximally. There is a linear dissection flap extending from the proximal through the mid left CCA, seen on prior exam, although more prominent on today's study (series 9, image 119). No significant intimal irregularity or intraluminal thrombus. Left CCA remains widely patent to the bifurcation. No significant narrowing about the left bifurcation itself. 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 Vertebral arteries: Both vertebral arteries arise from the subclavian arteries. Left  vertebral artery dominant. There is a new fusiform aneurysm measuring 10 x 7 mm involving the left V2 segment at the level of C4-5 (series 8, image 230). 5 mm aneurysm involving the distal right V2 segment at the level of the C2 foramen is relatively stable (series 8, image 194). Vertebral arteries otherwise widely patent and unchanged. Skeleton: No acute osseous finding. No discrete lytic or blastic osseous lesions Other neck: No other acute soft tissue abnormality within the neck. Upper chest: Layering fluid noted within the upper esophageal lumen. Scattered emphysematous changes noted within the lungs. Visualized upper chest demonstrates no other acute finding. Review of the MIP images confirms the above findings CTA HEAD FINDINGS Anterior circulation: Visualized petrous segments patent. Extensive streak artifacts seen about the cavernous sinus related to previous coil embolization, limiting assessment. Atheromatous plaque at the para clinoid left ICA with mild to moderate stenosis is unchanged. ICA termini well perfused. A1 segments patent and stable. Normal anterior communicating artery complex. Anterior cerebral arteries remain well opacified and patent. M1 segments widely patent. Negative MCA bifurcations. Distal MCA branches well perfused. Extensive asymmetric prominence of the venous system within the right cerebral hemisphere is now seen, presumably secondary to prior coil embolization about the cavernous sinus. Right inferior petrosal sinus dilated and prominent. Multiple prominent venous collaterals also extend cephalad along the anterior falx. Posterior circulation: Vertebral arteries widely patent to the vertebrobasilar junction and are stable in appearance. Posterior inferior cerebral arteries patent bilaterally visualized basilar patent proximally and distally without obvious stenosis, although assessment limited by streak artifact. Superior cerebral arteries patent bilaterally. Both PCAs remain  patent and well opacified to their distal aspects. Venous sinuses: Major dural sinuses remain patent. Multiple new venous collaterals along the right inferior petrosal sinus and right cerebral hemisphere related to coil embolization of the CC fistula. Left superior orbital vein has been embolized. Anatomic variants: None significant. Review of the MIP images confirms the above findings IMPRESSION: CT HEAD IMPRESSION: Stable appearance of the brain. No acute intracranial abnormality identified. CTA HEAD AND NECK IMPRESSION: 1. Extensive streak artifact from prior coil embolization of cavernous carotid fistula, limiting assessment. Evaluation for ongoing cc fistulous connection limited on this exam. 2. New dissection flap involving the proximal-mid left common carotid artery without significant narrowing. 3. 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 4. 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 5. Unchanged distal cervical ICA aneurysms as compared to previous. 6. 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 7. Interval development of multiple prominent venous collaterals throughout the right cerebral hemisphere related to altered  flow dynamics from previous coil embolization of CC fistula. Critical Value/emergent results were called by telephone at the time of interpretation on 03/13/2019 at 4:53 am to providerKRISTEN WARD , who verbally acknowledged these results. Electronically Signed   By: Rise Mu M.D.   On: 03/13/2019 04:53   Mr Brain Wo Contrast  Result Date: 03/13/2019 CLINICAL DATA:  History of carotid cavernous fistula. Carotid artery dissection. Evaluate for infarct. EXAM: MRI HEAD WITHOUT CONTRAST TECHNIQUE: Multiplanar, multiecho pulse sequences of the brain and surrounding structures were obtained without intravenous contrast. COMPARISON:  02/07/2019 FINDINGS: Brain: No acute infarction, hemorrhage, hydrocephalus,  extra-axial collection or mass lesion. Few remote white matter insults are stable and mainly periventricular. Normal brain volume Vascular: Abnormal number of asymmetric vessels along the surface of the right brain as recently evaluated by CTA. Bulbous cervical and cavernous ICA, reference prior CTA. Skull and upper cervical spine: Negative for marrow lesion Sinuses/Orbits: Negative IMPRESSION: Negative for acute infarct or other interval finding. Electronically Signed   By: Marnee Spring M.D.   On: 03/13/2019 07:33    Assessment and Plan:   1. Possible Marfan's - no significant abnormality on echo (no aortic dil - score is 7-8 (wrist & thumb sign 3, hindfoot deformity 2, flat foot 1, scoliosis 1, ?PTX at 18 at the time of 1st lung surgery) - she needs further evaluation, can be done as outpt - can refer to [employer] for further evaluation, blood tests - no family hx Marfan's, no history of sudden death, no overly tall relatives (dad was 6'1"). However, grandparents died when she was very young, so information may be incomplete.  - no additional inpatient workup needed, f/u as outpt.  Otherwise, per IM Principal Problem:   Carotid artery dissection (HCC) Active Problems:   Anxiety state   GERD   Essential hypertension   Hypokalemia   Carotid-cavernous fistula   Underweight   AKI (acute kidney injury) (HCC)     For questions or updates, please contact [facility] Please consult [email or web address] for contact info under Cardiology/STEMI.   SignedTheodore Demark, PA-C  03/13/2019 2:04 PM

## 2019-03-14 ENCOUNTER — Telehealth: Payer: Self-pay | Admitting: *Deleted

## 2019-03-14 ENCOUNTER — Other Ambulatory Visit: Payer: Self-pay | Admitting: Radiology

## 2019-03-14 ENCOUNTER — Encounter: Payer: Self-pay | Admitting: Neurology

## 2019-03-14 DIAGNOSIS — I7771 Dissection of carotid artery: Secondary | ICD-10-CM

## 2019-03-14 DIAGNOSIS — H539 Unspecified visual disturbance: Secondary | ICD-10-CM

## 2019-03-14 DIAGNOSIS — E785 Hyperlipidemia, unspecified: Secondary | ICD-10-CM

## 2019-03-14 LAB — BASIC METABOLIC PANEL
Anion gap: 7 (ref 5–15)
BUN: 12 mg/dL (ref 6–20)
CO2: 22 mmol/L (ref 22–32)
Calcium: 8.2 mg/dL — ABNORMAL LOW (ref 8.9–10.3)
Chloride: 111 mmol/L (ref 98–111)
Creatinine, Ser: 0.73 mg/dL (ref 0.44–1.00)
GFR calc Af Amer: >60 mL/min (ref >60)
GFR calc non Af Amer: >60 mL/min (ref >60)
Glucose, Bld: 90 mg/dL (ref 70–99)
Potassium: 3.6 mmol/L (ref 3.5–5.1)
Sodium: 140 mmol/L (ref 135–145)

## 2019-03-14 LAB — LIPID PANEL
Cholesterol: 193 mg/dL (ref 0–200)
HDL: 28 mg/dL — ABNORMAL LOW (ref >40)
LDL Cholesterol: 142 mg/dL — ABNORMAL HIGH (ref 0–99)
Total CHOL/HDL Ratio: 6.9 RATIO
Triglycerides: 116 mg/dL (ref <150)
VLDL: 23 mg/dL (ref 0–40)

## 2019-03-14 LAB — CBC
HCT: 38 % (ref 36.0–46.0)
Hemoglobin: 12.7 g/dL (ref 12.0–15.0)
MCH: 29.4 pg (ref 26.0–34.0)
MCHC: 33.4 g/dL (ref 30.0–36.0)
MCV: 88 fL (ref 80.0–100.0)
Platelets: 259 10*3/uL (ref 150–400)
RBC: 4.32 MIL/uL (ref 3.87–5.11)
RDW: 12.2 % (ref 11.5–15.5)
WBC: 8.2 10*3/uL (ref 4.0–10.5)
nRBC: 0 % (ref 0.0–0.2)

## 2019-03-14 LAB — MAGNESIUM: Magnesium: 1.8 mg/dL (ref 1.7–2.4)

## 2019-03-14 LAB — HEMOGLOBIN A1C
Hgb A1c MFr Bld: 5.4 % (ref 4.8–5.6)
Mean Plasma Glucose: 108.28 mg/dL

## 2019-03-14 LAB — PREALBUMIN: Prealbumin: 26.9 mg/dL (ref 18–38)

## 2019-03-14 MED ORDER — ASPIRIN 81 MG PO CHEW: 162.0000 mg | CHEWABLE_TABLET | Freq: Every day | ORAL | Status: AC

## 2019-03-14 MED ORDER — AMLODIPINE BESYLATE 10 MG PO TABS: 10.0000 mg | ORAL_TABLET | Freq: Every day | ORAL | 11 refills | Status: DC

## 2019-03-14 MED ORDER — HYDROCHLOROTHIAZIDE 12.5 MG PO TABS: 25.0000 mg | ORAL_TABLET | Freq: Every day | ORAL | Status: DC

## 2019-03-14 NOTE — Telephone Encounter (Signed)
I called the patient.  The patient was in the hospital yesterday with some visual changes of the left eye.  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  Dr. Garen Grams did see her, he is planning on doing another procedure, the patient will need to stay out of work at least until the beginning of the year, she may need permanent disability.  Her blood vessels clearly are fragile and are changing rapidly.   CTA head and neck 03/13/19:  CTA HEAD AND NECK IMPRESSION:  1. Extensive streak artifact from prior coil embolization of cavernous carotid fistula, limiting assessment. Evaluation for ongoing cc fistulous connection limited on this exam. 2. New dissection flap involving the proximal-mid left common carotid artery without significant narrowing. 3. 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 4. 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 5. Unchanged distal cervical ICA aneurysms as compared to previous. 6. 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 7. Interval development of multiple prominent venous collaterals throughout the right cerebral hemisphere related to altered flow dynamics from previous coil embolization of CC fistula.

## 2019-03-14 NOTE — TOC Transition Note (Signed)
Transition of Care [facility]) - CM/SW Discharge Note   Patient Details  Name: [name] MRN: [id number] Date of Birth: 12-03-1970  Transition of [facility]) CM/SW Contact:  Pollie Friar, RN Phone Number: 03/14/2019, 12:51 PM   Clinical Narrative:    Pt is discharging home with self care. Pt has hospital f/u, insurance and transportation home.    Final next level of care: Home/Self Care Barriers to Discharge: No Barriers Identified   Patient Goals and CMS Choice        Discharge Placement                       Discharge Plan and Services                                     Social Determinants of Health (SDOH) Interventions     Readmission Risk Interventions Readmission Risk Prevention Plan 11/02/2018  Transportation Screening Complete  PCP or Specialist Appt within 3-5 Days Complete  HRI or [place] Complete  Social Work Consult for [place] Planning/Counseling Not Complete  SW consult not completed comments Not appropriate; pt has no CSW needs  Palliative Care Screening Not Applicable  Medication Review [employer]) Complete  Some recent data might be hidden

## 2019-03-14 NOTE — Telephone Encounter (Signed)
Pt was evaluated in the ED on 03/13/2019 for visual disturbances. I called the pt and had to leave a vm. I advised her to call back so we could get her in for an appointment and discuss information needed for her job.

## 2019-03-14 NOTE — Telephone Encounter (Signed)
Patient and her sister stopped by the office.  Patient states she spent last night in the hospital and has had another MRI.  Dr. Estanislado Pandy suggested she needs to see Dr. [name] ASAP to discuss the changes.  States due to the changes there is no way she will be able to return to work the date Dr. [name] said she could and she will need this changed or she will lose her job.  Please call as soon as possible to discuss.

## 2019-03-14 NOTE — Progress Notes (Signed)
Supervising Physician: [name], [name]  [facility]  Subjective: Pt resting in bed. Finally feels like she got some rest.  Still reports some double vision, mostly when looking up. Reports the visual effect 'sparkles/zaps' have subsided. Denies N/V, Headache. Cardiology consult appreciate, report and recs noted.  Objective: Physical Exam: BP (!) 131/93 (BP Location: Left Arm)    Pulse 70    Temp 98.1 F (36.7 C) (Oral)    Resp 18    SpO2 100%   Physical Exam Vitals signs and nursing note reviewed.  Constitutional:      General: She is not in acute distress.    Appearance: Normal appearance.  Pulmonary:     Effort: Pulmonary effort is normal. No respiratory distress.  Skin:    General: Skin is warm and dry.  Neurological:     Mental Status: She is alert and oriented to person, place, and time.     Comments: Alert, awake, and oriented x3. Speech and comprehension intact. No facial asymmetry. Motor power symmetric proportional to effort.  Psychiatric:        Mood and Affect: Mood normal.        Behavior: Behavior normal.        Thought Content: Thought content normal.        Judgment: Judgment normal.    Current Facility-Administered Medications:     stroke: mapping our early stages of recovery book, , Does not apply, Once, Clydie BraunSmith, Rondell A, MD, Stopped at 03/13/19 1405   0.9 %  sodium chloride infusion, , Intravenous, Continuous, Katrinka BlazingSmith, Rondell A, MD, Last Rate: 75 mL/hr at 03/13/19 2308   acetaminophen (TYLENOL) tablet 650 mg, 650 mg, Oral, Q4H PRN, 650 mg at 03/14/19 0728 **OR** acetaminophen (TYLENOL) solution 650 mg, 650 mg, Per Tube, Q4H PRN **OR** acetaminophen (TYLENOL) suppository 650 mg, 650 mg, Rectal, Q4H PRN, Katrinka BlazingSmith, Rondell A, MD   aspirin chewable tablet 162 mg, 162 mg, Oral, Daily, Smith, Rondell A, MD   diazepam (VALIUM) tablet 5 mg, 5 mg, Oral, QHS, Smith, Rondell A, MD, 5 mg at 03/13/19 2151   enoxaparin (LOVENOX) injection  40 mg, 40 mg, Subcutaneous, Q24H, Smith, Rondell A, MD   feeding supplement (BOOST / RESOURCE BREEZE) liquid 1 Container, 1 Container, Oral, TID BM, Smith, Rondell A, MD   ferrous sulfate tablet 325 mg, 325 mg, Oral, Q breakfast, Katrinka BlazingSmith, Rondell A, MD, 325 mg at 03/14/19 [id number]   hydrALAZINE (APRESOLINE) injection 10 mg, 10 mg, Intravenous, Q4H PRN, Katrinka BlazingSmith, Rondell A, MD   polyvinyl alcohol (LIQUIFILM TEARS) 1.4 % ophthalmic solution 1 drop, 1 drop, Both Eyes, PRN, Smith, Rondell A, MD   polyvinyl alcohol (LIQUIFILM TEARS) 1.4 % ophthalmic solution 1 drop, 1 drop, Left Eye, BID, Smith, Rondell A, MD, 1 drop at 03/13/19 2149   potassium chloride SA (K-DUR) CR tablet 20 mEq, 20 mEq, Oral, Daily, Smith, Rondell A, MD, 20 mEq at 03/13/19 0947   promethazine (PHENERGAN) injection 12.5 mg, 12.5 mg, Intravenous, Q6H PRN, Smith, Rondell A, MD   timolol (TIMOPTIC) 0.5 % ophthalmic solution 1 drop, 1 drop, Left Eye, BID, Smith, Rondell A, MD, 1 drop at 03/13/19 2154  Labs: CBC Recent Labs    03/13/19 0209 03/14/19 0322  WBC 10.3 8.2  HGB 14.8 12.7  HCT 42.7 38.0  PLT 292 259   BMET Recent Labs    03/13/19 0209 03/14/19 0322  NA 137 140  K 2.9* 3.6  CL 103 111  CO2 24  22  GLUCOSE 119* 90  BUN 16 12  CREATININE 1.15* 0.73  CALCIUM 9.4 8.2*   LFT No results for input(s): PROT, ALBUMIN, AST, ALT, ALKPHOS, BILITOT, BILIDIR, IBILI, LIPASE in the last 72 hours. PT/INR No results for input(s): LABPROT, INR in the last 72 hours.   Studies/Results: Ct Angio Head W Or Wo Contrast  Result Date: 03/13/2019 CLINICAL DATA:  Initial evaluation for worsening double vision, vomiting. History of CC fistula. EXAM: CT ANGIOGRAPHY HEAD AND NECK TECHNIQUE: Multidetector CT imaging of the head and neck was performed using the standard protocol during bolus administration of intravenous contrast. Multiplanar CT image reconstructions and MIPs were obtained to evaluate the vascular anatomy. Carotid  stenosis measurements (when applicable) are obtained utilizing NASCET criteria, using the distal internal carotid diameter as the denominator. CONTRAST:  19mL OMNIPAQUE IOHEXOL 350 MG/ML SOLN COMPARISON:  Prior MRI/MRA from 02/07/2019 as well as previous CTA from 10/26/2018. 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 No acute intracranial hemorrhage. No acute large vessel territory infarct. No mass lesion, mass effect, or midline shift. No hydrocephalus. No extra-axial fluid collection. Scattered nonspecific cerebral white matter changes grossly stable. Vascular: No hyperdense vessel. Skull: Scalp soft tissues and calvarium within normal limits. Sinuses: Mild layering opacity noted within the left sphenoid sinus. Paranasal sinuses are otherwise clear. No mastoid effusion. Orbits: Partially visualized globes and orbital soft tissues grossly unremarkable. CTA NECK FINDINGS Aortic arch: Visualized aortic arch normal in caliber with normal 3 vessel morphology. Mild atheromatous change at the origin of the left subclavian artery without significant stenosis. No hemodynamically significant stenosis about the origin of the great vessels. Visualized subclavian arteries widely patent. Right carotid system: Right CCA patent from its origin to the bifurcation without stenosis or other abnormality. Irregular somewhat linear filling defect about the right bifurcation favored to be artifactual, although a possible small dissection flap not entirely excluded (series 9, image 113). No significant atheromatous stenosis about the right bifurcation. Right ICA widely patent distally to the skull base. Distal cervical ICA dolichoectatic. Associated aneurysm relatively similar measuring approximately 10 x 6 mm. Left carotid system: Left common carotid artery widely patent proximally. There is a linear dissection  flap extending from the proximal through the mid left CCA, seen on prior exam, although more prominent on today's study (series 9, image 119). No significant intimal irregularity or intraluminal thrombus. Left CCA remains widely patent to the bifurcation. No significant narrowing about the left bifurcation itself. 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 Vertebral arteries: Both vertebral arteries arise from the subclavian arteries. Left vertebral artery dominant. There is a new fusiform aneurysm measuring 10 x 7 mm involving the left V2 segment at the level of C4-5 (series 8, image 230). 5 mm aneurysm involving the distal right V2 segment at the level of the C2 foramen is relatively stable (series 8, image 194). Vertebral arteries otherwise widely patent and unchanged. Skeleton: No acute osseous finding. No discrete lytic or blastic osseous lesions Other neck: No other acute soft tissue abnormality within the neck. Upper chest: Layering fluid noted within the upper esophageal lumen. Scattered emphysematous changes noted within the lungs. Visualized upper chest demonstrates no other acute finding. Review of the MIP images confirms the above findings CTA HEAD FINDINGS  Anterior circulation: Visualized petrous segments patent. Extensive streak artifacts seen about the cavernous sinus related to previous coil embolization, limiting assessment. Atheromatous plaque at the para clinoid left ICA with mild to moderate stenosis is unchanged. ICA termini well perfused. A1 segments patent and stable. Normal anterior communicating artery complex. Anterior cerebral arteries remain well opacified and patent. M1 segments widely patent. Negative MCA bifurcations. Distal MCA branches well perfused. Extensive asymmetric prominence of the venous system within the right cerebral  hemisphere is now seen, presumably secondary to prior coil embolization about the cavernous sinus. Right inferior petrosal sinus dilated and prominent. Multiple prominent venous collaterals also extend cephalad along the anterior falx. Posterior circulation: Vertebral arteries widely patent to the vertebrobasilar junction and are stable in appearance. Posterior inferior cerebral arteries patent bilaterally visualized basilar patent proximally and distally without obvious stenosis, although assessment limited by streak artifact. Superior cerebral arteries patent bilaterally. Both PCAs remain patent and well opacified to their distal aspects. Venous sinuses: Major dural sinuses remain patent. Multiple new venous collaterals along the right inferior petrosal sinus and right cerebral hemisphere related to coil embolization of the CC fistula. Left superior orbital vein has been embolized. Anatomic variants: None significant. Review of the MIP images confirms the above findings IMPRESSION: CT HEAD IMPRESSION: Stable appearance of the brain. No acute intracranial abnormality identified. CTA HEAD AND NECK IMPRESSION: 1. Extensive streak artifact from prior coil embolization of cavernous carotid fistula, limiting assessment. Evaluation for ongoing cc fistulous connection limited on this exam. 2. New dissection flap involving the proximal-mid left common carotid artery without significant narrowing. 3. 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 4. 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 5. Unchanged distal cervical ICA aneurysms as compared to previous. 6. 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 7. Interval development of multiple prominent venous collaterals throughout the right cerebral hemisphere related to altered flow dynamics from previous coil embolization of CC fistula. Critical Value/emergent results were called by telephone at the time of  interpretation on 03/13/2019 at 4:53 am to Knapp , who verbally acknowledged these results. Electronically Signed   By: Jeannine Boga M.D.   On: 03/13/2019 04:53   Ct Angio Neck W And/or Wo Contrast  Result Date: 03/13/2019 CLINICAL DATA:  Initial evaluation for worsening double vision, vomiting. History of CC fistula. EXAM: CT ANGIOGRAPHY HEAD AND NECK TECHNIQUE: Multidetector CT imaging of the head and neck was performed using the standard protocol during bolus administration of intravenous contrast. Multiplanar CT image reconstructions and MIPs were obtained to evaluate the vascular anatomy. Carotid stenosis measurements (when applicable) are obtained utilizing NASCET criteria, using the distal internal carotid diameter as the denominator. CONTRAST:  32mL OMNIPAQUE IOHEXOL 350 MG/ML SOLN COMPARISON:  Prior MRI/MRA from 02/07/2019 as well as previous CTA from 10/26/2018. 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 No acute intracranial hemorrhage. No acute large vessel territory infarct. No mass lesion, mass effect, or midline shift. No hydrocephalus. No extra-axial fluid collection. Scattered nonspecific cerebral white matter changes grossly stable. Vascular: No hyperdense vessel. Skull: Scalp soft tissues and calvarium within normal limits. Sinuses: Mild layering opacity noted within the left sphenoid sinus. Paranasal sinuses are otherwise clear. No mastoid effusion. Orbits: Partially visualized globes and orbital soft tissues grossly unremarkable. CTA NECK FINDINGS Aortic arch: Visualized aortic arch normal in caliber  with normal 3 vessel morphology. Mild atheromatous change at the origin of the left subclavian artery without significant stenosis. No hemodynamically significant stenosis about the origin of the great vessels. Visualized subclavian arteries widely  patent. Right carotid system: Right CCA patent from its origin to the bifurcation without stenosis or other abnormality. Irregular somewhat linear filling defect about the right bifurcation favored to be artifactual, although a possible small dissection flap not entirely excluded (series 9, image 113). No significant atheromatous stenosis about the right bifurcation. Right ICA widely patent distally to the skull base. Distal cervical ICA dolichoectatic. Associated aneurysm relatively similar measuring approximately 10 x 6 mm. Left carotid system: Left common carotid artery widely patent proximally. There is a linear dissection flap extending from the proximal through the mid left CCA, seen on prior exam, although more prominent on today's study (series 9, image 119). No significant intimal irregularity or intraluminal thrombus. Left CCA remains widely patent to the bifurcation. No significant narrowing about the left bifurcation itself. 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 Vertebral arteries: Both vertebral arteries arise from the subclavian arteries. Left vertebral artery dominant. There is a new fusiform aneurysm measuring 10 x 7 mm involving the left V2 segment at the level of C4-5 (series 8, image 230). 5 mm aneurysm involving the distal right V2 segment at the level of the C2 foramen is relatively stable (series 8, image 194). Vertebral arteries otherwise widely patent and unchanged. Skeleton: No acute osseous finding. No discrete lytic or blastic osseous lesions Other neck: No other acute soft tissue abnormality within the neck. Upper chest: Layering fluid noted within the upper esophageal lumen. Scattered emphysematous changes noted within the lungs. Visualized upper chest demonstrates no other acute finding. Review of the MIP images confirms the above  findings CTA HEAD FINDINGS Anterior circulation: Visualized petrous segments patent. Extensive streak artifacts seen about the cavernous sinus related to previous coil embolization, limiting assessment. Atheromatous plaque at the para clinoid left ICA with mild to moderate stenosis is unchanged. ICA termini well perfused. A1 segments patent and stable. Normal anterior communicating artery complex. Anterior cerebral arteries remain well opacified and patent. M1 segments widely patent. Negative MCA bifurcations. Distal MCA branches well perfused. Extensive asymmetric prominence of the venous system within the right cerebral hemisphere is now seen, presumably secondary to prior coil embolization about the cavernous sinus. Right inferior petrosal sinus dilated and prominent. Multiple prominent venous collaterals also extend cephalad along the anterior falx. Posterior circulation: Vertebral arteries widely patent to the vertebrobasilar junction and are stable in appearance. Posterior inferior cerebral arteries patent bilaterally visualized basilar patent proximally and distally without obvious stenosis, although assessment limited by streak artifact. Superior cerebral arteries patent bilaterally. Both PCAs remain patent and well opacified to their distal aspects. Venous sinuses: Major dural sinuses remain patent. Multiple new venous collaterals along the right inferior petrosal sinus and right cerebral hemisphere related to coil embolization of the CC fistula. Left superior orbital vein has been embolized. Anatomic variants: None significant. Review of the MIP images confirms the above findings IMPRESSION: CT HEAD IMPRESSION: Stable appearance of the brain. No acute intracranial abnormality identified. CTA HEAD AND NECK IMPRESSION: 1. Extensive streak artifact from prior coil embolization of cavernous carotid fistula, limiting assessment. Evaluation for ongoing cc fistulous connection limited on this exam. 2. New  dissection flap involving the proximal-mid left  common carotid artery without significant narrowing. 3. 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 4. 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 5. Unchanged distal cervical ICA aneurysms as compared to previous. 6. 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 7. Interval development of multiple prominent venous collaterals throughout the right cerebral hemisphere related to altered flow dynamics from previous coil embolization of CC fistula. Critical Value/emergent results were called by telephone at the time of interpretation on 03/13/2019 at 4:53 am to providerKRISTEN WARD , who verbally acknowledged these results. Electronically Signed   By: Rise Mu M.D.   On: 03/13/2019 04:53   Mr Brain Wo Contrast  Result Date: 03/13/2019 CLINICAL DATA:  History of carotid cavernous fistula. Carotid artery dissection. Evaluate for infarct. EXAM: MRI HEAD WITHOUT CONTRAST TECHNIQUE: Multiplanar, multiecho pulse sequences of the brain and surrounding structures were obtained without intravenous contrast. COMPARISON:  02/07/2019 FINDINGS: Brain: No acute infarction, hemorrhage, hydrocephalus, extra-axial collection or mass lesion. Few remote white matter insults are stable and mainly periventricular. Normal brain volume Vascular: Abnormal number of asymmetric vessels along the surface of the right brain as recently evaluated by CTA. Bulbous cervical and cavernous ICA, reference prior CTA. Skull and upper cervical spine: Negative for marrow lesion Sinuses/Orbits: Negative IMPRESSION: Negative for acute infarct or other interval finding. Electronically Signed   By: Marnee Spring M.D.   On: 03/13/2019 07:33    Assessment/Plan: Transient visual symptoms. 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 Corliss Skains, s/p staged  embolization using coiling 10/31/2018 by Dr. Corliss Skains, s/pstaged embolization of right carotid cavernous sinus region filling via the intra cavernous channels from the left cavernous sinus using coils 12/05/2018 by Dr. Corliss Skains. Discussed findings of imaging scan. Explained that there is no evidence of stroke on MR. Explained that everything known is stable (left carotid fistula, bilateral ICA aneurysms, right VA aneurysm). Explained that there is a suspicion of a dilation of left VA- not seen on prior exams. However, these findings would not explain her symptoms. Because of this, do not recommend diagnostic cerebral arteriogram at this time (will not offer [place] any new information per Dr. Corliss Skains).  Discussed connective tissue work-up. Patient states that she has started doing research on her own to find a genetic specialist to complete this work-up. States she has had phone conversations with both [facility] and [facility], but no appointment has been made to be seen.  Recommendations: - Continue taking Aspirin 81 mg twice daily (ok to take both at one time).  - No plan for immediate NIR intervention at this time- patient needs to gain weight (current BMI 17.1, high risk of complications with anesthesia) and complete connective tissue work-up prior to intervention, next time patient goes for cerebral arteriogram will be with intent to treat left ICA fistula. Will schedule CTangiogram in 3 months time. Certainly if she develops worsening symptoms that requires urgent intervention, will address that sooner.  Continue output workup as discussed per Cardiology note. Needs to resume regular follow up with PCP for central organization amongst all specialists.     LOS: 1 day   I spent a total of 25 minutes in face to face in clinical consultation, greater than 50% of which was counseling/coordinating care for known left ICA dissection and aneurysm  Brayton El PA-C 03/14/2019 9:29 AM

## 2019-03-14 NOTE — Progress Notes (Signed)
STROKE TEAM PROGRESS NOTE   INTERVAL HISTORY Pt sitting in chair, and being discharged. She stated that her left side blurry vision improved some but not back to normal. She still has intermittent diplopia. No more left eye flashes.   Vitals:   03/13/19 2343 03/14/19 0305 03/14/19 0833 03/14/19 1141  BP: 121/81 114/80 (!) 131/93 (!) 131/97  Pulse: 80 77 70 70  Resp: 18 18 18 18   Temp: 98.7 F (37.1 C) 98.6 F (37 C) 98.1 F (36.7 C) 98.4 F (36.9 C)  TempSrc: Oral Oral Oral Oral  SpO2: 100% 100% 100% 99%  Weight:      Height:        CBC:  Recent Labs  Lab 03/13/19 0209 03/14/19 0322  WBC 10.3 8.2  NEUTROABS 6.8  --   HGB 14.8 12.7  HCT 42.7 38.0  MCV 87.5 88.0  PLT 292 259    Basic Metabolic Panel:  Recent Labs  Lab 03/13/19 0209 03/14/19 0322  NA 137 140  K 2.9* 3.6  CL 103 111  CO2 24 22  GLUCOSE 119* 90  BUN 16 12  CREATININE 1.15* 0.73  CALCIUM 9.4 8.2*  MG  --  1.8   Lipid Panel:     Component Value Date/Time   CHOL 193 03/14/2019 0322   TRIG 116 03/14/2019 0322   HDL 28 (L) 03/14/2019 0322   CHOLHDL 6.9 03/14/2019 0322   VLDL 23 03/14/2019 0322   LDLCALC 142 (H) 03/14/2019 0322   HgbA1c:  Lab Results  Component Value Date   HGBA1C 5.4 03/14/2019   Urine Drug Screen:     Component Value Date/Time   LABOPIA NONE DETECTED 06/13/2018 1815   COCAINSCRNUR NONE DETECTED 06/13/2018 1815   LABBENZ NONE DETECTED 06/13/2018 1815   AMPHETMU NONE DETECTED 06/13/2018 1815   THCU NONE DETECTED 06/13/2018 1815   LABBARB NONE DETECTED 06/13/2018 1815    Alcohol Level     Component Value Date/Time   [name]  [facility] 10[facility]    <5        LOWEST DETECTABLE LIMIT FOR SERUM ALCOHOL IS 5 mg/dL FOR MEDICAL PURPOSES ONLY    IMAGING Ct Angio Head W Or Wo Contrast  Result Date: 03/13/2019 CLINICAL DATA:  Initial evaluation for worsening double vision, vomiting. History of CC fistula. EXAM: CT ANGIOGRAPHY HEAD AND NECK TECHNIQUE: Multidetector CT imaging  of the head and neck was performed using the standard protocol during bolus administration of intravenous contrast. Multiplanar CT image reconstructions and MIPs were obtained to evaluate the vascular anatomy. Carotid stenosis measurements (when applicable) are obtained utilizing NASCET criteria, using the distal internal carotid diameter as the denominator. CONTRAST:  80mL OMNIPAQUE IOHEXOL 350 MG/ML SOLN COMPARISON:  Prior MRI/MRA from 02/07/2019 as well as previous CTA from 10/26/2018. 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 No acute intracranial hemorrhage. No acute large vessel territory infarct. No mass lesion, mass effect, or midline shift. No hydrocephalus. No extra-axial fluid collection. Scattered nonspecific cerebral white matter changes grossly stable. Vascular: No hyperdense vessel. Skull: Scalp soft tissues and calvarium within normal limits. Sinuses: Mild layering opacity noted within the left sphenoid sinus. Paranasal sinuses are otherwise clear. No mastoid effusion. Orbits: Partially visualized globes and orbital soft tissues grossly unremarkable. CTA NECK FINDINGS Aortic arch: Visualized aortic arch normal in caliber with normal 3 vessel morphology. Mild atheromatous change at the origin of the left subclavian  artery without significant stenosis. No hemodynamically significant stenosis about the origin of the great vessels. Visualized subclavian arteries widely patent. Right carotid system: Right CCA patent from its origin to the bifurcation without stenosis or other abnormality. Irregular somewhat linear filling defect about the right bifurcation favored to be artifactual, although a possible small dissection flap not entirely excluded (series 9, image 113). No significant atheromatous stenosis about the right bifurcation. Right ICA widely patent distally to the skull  base. Distal cervical ICA dolichoectatic. Associated aneurysm relatively similar measuring approximately 10 x 6 mm. Left carotid system: Left common carotid artery widely patent proximally. There is a linear dissection flap extending from the proximal through the mid left CCA, seen on prior exam, although more prominent on today's study (series 9, image 119). No significant intimal irregularity or intraluminal thrombus. Left CCA remains widely patent to the bifurcation. No significant narrowing about the left bifurcation itself. 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 Vertebral arteries: Both vertebral arteries arise from the subclavian arteries. Left vertebral artery dominant. There is a new fusiform aneurysm measuring 10 x 7 mm involving the left V2 segment at the level of C4-5 (series 8, image 230). 5 mm aneurysm involving the distal right V2 segment at the level of the C2 foramen is relatively stable (series 8, image 194). Vertebral arteries otherwise widely patent and unchanged. Skeleton: No acute osseous finding. No discrete lytic or blastic osseous lesions Other neck: No other acute soft tissue abnormality within the neck. Upper chest: Layering fluid noted within the upper esophageal lumen. Scattered emphysematous changes noted within the lungs. Visualized upper chest demonstrates no other acute finding. Review of the MIP images confirms the above findings CTA HEAD FINDINGS Anterior circulation: Visualized petrous segments patent. Extensive streak artifacts seen about the cavernous sinus related to previous coil embolization, limiting assessment. Atheromatous plaque at the para clinoid left ICA with mild to moderate stenosis is unchanged. ICA termini well perfused. A1 segments patent and stable. Normal anterior communicating artery complex. Anterior cerebral  arteries remain well opacified and patent. M1 segments widely patent. Negative MCA bifurcations. Distal MCA branches well perfused. Extensive asymmetric prominence of the venous system within the right cerebral hemisphere is now seen, presumably secondary to prior coil embolization about the cavernous sinus. Right inferior petrosal sinus dilated and prominent. Multiple prominent venous collaterals also extend cephalad along the anterior falx. Posterior circulation: Vertebral arteries widely patent to the vertebrobasilar junction and are stable in appearance. Posterior inferior cerebral arteries patent bilaterally visualized basilar patent proximally and distally without obvious stenosis, although assessment limited by streak artifact. Superior cerebral arteries patent bilaterally. Both PCAs remain patent and well opacified to their distal aspects. Venous sinuses: Major dural sinuses remain patent. Multiple new venous collaterals along the right inferior petrosal sinus and right cerebral hemisphere related to coil embolization of the CC fistula. Left superior orbital vein has been embolized. Anatomic variants: None significant. Review of the MIP images confirms the above findings IMPRESSION: CT HEAD IMPRESSION: Stable appearance of the brain. No acute intracranial abnormality identified. CTA HEAD AND NECK IMPRESSION: 1. Extensive streak artifact from prior coil embolization of cavernous carotid fistula, limiting assessment. Evaluation for ongoing cc fistulous connection limited on this exam. 2. New dissection flap involving the proximal-mid left common carotid artery without significant narrowing. 3. New 3 mm aneurysm/pseudoaneurysm extending from the proximal cervical  left ICA as above. 4. 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 5. Unchanged distal cervical ICA aneurysms as compared to previous. 6. 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 7. Interval development of multiple prominent  venous collaterals throughout the right cerebral hemisphere related to altered flow dynamics from previous coil embolization of CC fistula. Critical Value/emergent results were called by telephone at the time of interpretation on 03/13/2019 at 4:53 am to providerKRISTEN WARD , who verbally acknowledged these results. Electronically Signed   By: Rise Mu M.D.   On: 03/13/2019 04:53   Ct Angio Neck W And/or Wo Contrast  Result Date: 03/13/2019 CLINICAL DATA:  Initial evaluation for worsening double vision, vomiting. History of CC fistula. EXAM: CT ANGIOGRAPHY HEAD AND NECK TECHNIQUE: Multidetector CT imaging of the head and neck was performed using the standard protocol during bolus administration of intravenous contrast. Multiplanar CT image reconstructions and MIPs were obtained to evaluate the vascular anatomy. Carotid stenosis measurements (when applicable) are obtained utilizing NASCET criteria, using the distal internal carotid diameter as the denominator. CONTRAST:  80mL OMNIPAQUE IOHEXOL 350 MG/ML SOLN COMPARISON:  Prior MRI/MRA from 02/07/2019 as well as previous CTA from 10/26/2018. 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 No acute intracranial hemorrhage. No acute large vessel territory infarct. No mass lesion, mass effect, or midline shift. No hydrocephalus. No extra-axial fluid collection. Scattered nonspecific cerebral white matter changes grossly stable. Vascular: No hyperdense vessel. Skull: Scalp soft tissues and calvarium within normal limits. Sinuses: Mild layering opacity noted within the left sphenoid sinus. Paranasal sinuses are otherwise clear. No mastoid effusion. Orbits: Partially visualized globes and orbital soft tissues grossly unremarkable. CTA NECK FINDINGS Aortic arch: Visualized aortic arch normal in caliber with normal 3 vessel morphology. Mild  atheromatous change at the origin of the left subclavian artery without significant stenosis. No hemodynamically significant stenosis about the origin of the great vessels. Visualized subclavian arteries widely patent. Right carotid system: Right CCA patent from its origin to the bifurcation without stenosis or other abnormality. Irregular somewhat linear filling defect about the right bifurcation favored to be artifactual, although a possible small dissection flap not entirely excluded (series 9, image 113). No significant atheromatous stenosis about the right bifurcation. Right ICA widely patent distally to the skull base. Distal cervical ICA dolichoectatic. Associated aneurysm relatively similar measuring approximately 10 x 6 mm. Left carotid system: Left common carotid artery widely patent proximally. There is a linear dissection flap extending from the proximal through the mid left CCA, seen on prior exam, although more prominent on today's study (series 9, image 119). No significant intimal irregularity or intraluminal thrombus. Left CCA remains widely patent to the bifurcation. No significant narrowing about the left bifurcation itself. 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 Vertebral arteries: Both vertebral arteries arise from the subclavian arteries. Left vertebral artery dominant. There is a new fusiform aneurysm measuring 10 x 7 mm involving the left V2 segment at the level of C4-5 (series 8, image 230). 5 mm aneurysm involving the distal right V2 segment at the level of the C2 foramen is relatively stable (series 8, image 194). Vertebral arteries otherwise widely patent and unchanged. Skeleton: No acute osseous finding. No discrete lytic or  blastic osseous lesions Other neck: No other acute soft tissue abnormality within the neck. Upper chest: Layering  fluid noted within the upper esophageal lumen. Scattered emphysematous changes noted within the lungs. Visualized upper chest demonstrates no other acute finding. Review of the MIP images confirms the above findings CTA HEAD FINDINGS Anterior circulation: Visualized petrous segments patent. Extensive streak artifacts seen about the cavernous sinus related to previous coil embolization, limiting assessment. Atheromatous plaque at the para clinoid left ICA with mild to moderate stenosis is unchanged. ICA termini well perfused. A1 segments patent and stable. Normal anterior communicating artery complex. Anterior cerebral arteries remain well opacified and patent. M1 segments widely patent. Negative MCA bifurcations. Distal MCA branches well perfused. Extensive asymmetric prominence of the venous system within the right cerebral hemisphere is now seen, presumably secondary to prior coil embolization about the cavernous sinus. Right inferior petrosal sinus dilated and prominent. Multiple prominent venous collaterals also extend cephalad along the anterior falx. Posterior circulation: Vertebral arteries widely patent to the vertebrobasilar junction and are stable in appearance. Posterior inferior cerebral arteries patent bilaterally visualized basilar patent proximally and distally without obvious stenosis, although assessment limited by streak artifact. Superior cerebral arteries patent bilaterally. Both PCAs remain patent and well opacified to their distal aspects. Venous sinuses: Major dural sinuses remain patent. Multiple new venous collaterals along the right inferior petrosal sinus and right cerebral hemisphere related to coil embolization of the CC fistula. Left superior orbital vein has been embolized. Anatomic variants: None significant. Review of the MIP images confirms the above findings IMPRESSION: CT HEAD IMPRESSION: Stable appearance of the brain. No acute intracranial abnormality identified. CTA HEAD AND  NECK IMPRESSION: 1. Extensive streak artifact from prior coil embolization of cavernous carotid fistula, limiting assessment. Evaluation for ongoing cc fistulous connection limited on this exam. 2. New dissection flap involving the proximal-mid left common carotid artery without significant narrowing. 3. 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 4. 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 5. Unchanged distal cervical ICA aneurysms as compared to previous. 6. 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 7. Interval development of multiple prominent venous collaterals throughout the right cerebral hemisphere related to altered flow dynamics from previous coil embolization of CC fistula. Critical Value/emergent results were called by telephone at the time of interpretation on 03/13/2019 at 4:53 am to providerKRISTEN WARD , who verbally acknowledged these results. Electronically Signed   By: Rise Mu M.D.   On: 03/13/2019 04:53   Mr Brain Wo Contrast  Result Date: 03/13/2019 CLINICAL DATA:  History of carotid cavernous fistula. Carotid artery dissection. Evaluate for infarct. EXAM: MRI HEAD WITHOUT CONTRAST TECHNIQUE: Multiplanar, multiecho pulse sequences of the brain and surrounding structures were obtained without intravenous contrast. COMPARISON:  02/07/2019 FINDINGS: Brain: No acute infarction, hemorrhage, hydrocephalus, extra-axial collection or mass lesion. Few remote white matter insults are stable and mainly periventricular. Normal brain volume Vascular: Abnormal number of asymmetric vessels along the surface of the right brain as recently evaluated by CTA. Bulbous cervical and cavernous ICA, reference prior CTA. Skull and upper cervical spine: Negative for marrow lesion Sinuses/Orbits: Negative IMPRESSION: Negative for acute infarct or other interval finding. Electronically Signed   By: Marnee Spring M.D.   On:  03/13/2019 07:33    PHYSICAL EXAM  Temp:  [98.1 F (36.7 C)-98.7 F (37.1 C)] 98.4 F (36.9 C) (09/24 1141) Pulse Rate:  [70-80] 70 (09/24 1141) Resp:  [  18] 18 (09/24 1141) BP: (114-131)/(80-97) 131/97 (09/24 1141) SpO2:  [99 %-100 %] 99 % (09/24 1141)  General - Well nourished, well developed, in no apparent distress.  Ophthalmologic - fundi not visualized due to noncooperation.  Cardiovascular - Regular rhythm and rate.  Mental Status -  Level of arousal and orientation to time, place, and person were intact. Language including expression, naming, repetition, comprehension was assessed and found intact. Attention span and concentration were normal. Fund of Knowledge was assessed and was intact.  Cranial Nerves II - XII - II - Visual field intact OU. III, IV, VI - Extraocular movements intact. Subjective diplopia on left and upper gaze. Bilateral mild ptosis, L>R.  V - Facial sensation intact bilaterally. VII - Facial movement intact bilaterally. VIII - Hearing & vestibular intact bilaterally. X - Palate elevates symmetrically. XI - Chin turning & shoulder shrug intact bilaterally. XII - Tongue protrusion intact.  Motor Strength - The patient's strength was normal in all extremities and pronator drift was absent.  Bulk was normal and fasciculations were absent.   Motor Tone - Muscle tone was assessed at the neck and appendages and was normal.  Reflexes - The patient's reflexes were symmetrical in all extremities and she had no pathological reflexes.  Sensory - Light touch, temperature/pinprick were assessed and were symmetrical.    Coordination - The patient had normal movements in the hands and feet with no ataxia or dysmetria.  Tremor was absent.  Gait and Station - deferred.   ASSESSMENT/PLAN [name] is a 48 y.o. 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 New prox-mild L CCA dissection flap. New prox cervical L ICA 3mm aneurysm/pseudoaneurysm. New L V2 fusiform aneurysm. Unchanged distal cervical ICA and distal R V2 aneurysms. Interval development mult collaterals throughout R cerebral hemisphere.  MRI  No acute infarct   LDL 142  HgbA1c 5.4  Lovenox 40 mg sq daily for VTE prophylaxis  aspirin 81 mg daily prior to admission, now on aspirin 162 mg daily.   Disposition:  return home  Dr. Corliss Skains recommended no treatment at [facility] time. Plan for wt gain prior to tx along with completing connective tissue w/u.  Dr. Corliss Skains to f/u as OP in 3 mos with cerebral angio  Recommend OP ophthamology f/u ASAP to rule out retinal detachment  Follows with Dr. Anne Hahn at [facility]  ? Marfan's syndrome   Cardiology consulted  Recommend outpt follow up and investigation  Plan for genetic testing  Hypertension  Meds adjusted by IM d/t AKI  Hyperlipidemia  Home meds:  No statin  LDL 142  Pt refused statin at this time, stated will discuss with PCP  Other Stroke Risk Factors  ETOH use, advised to drink no more than 1 drink(s) a day  Migraines  Hx DVT LE treated w/ warfarin course 2005  Other Active Problems  Anxiety, bipolar - for OP f/u  AKI  Hospital day # 1  Neurology will sign off. Please call with questions. Pt will follow up with Dr. Anne Hahn 04/01/19 at [facility]. Thanks for the  consult.  Rosalin Hawking, MD PhD Stroke Neurology 03/14/2019 7:59 PM  To contact Stroke Continuity provider, please refer to [email or web address] After hours,  contact General Neurology

## 2019-03-14 NOTE — Discharge Summary (Signed)
Physician Discharge Summary  [name] UEA:[id number] DOB: 11/05/1970 DOA: 03/13/2019  PCP: Carla GarterPlotnikov, Aleksei V, MD  Admit date: 03/13/2019 Discharge date: 03/14/2019  Time spent: 45 minutes  Recommendations for Outpatient Follow-up:  1. Patient needs outpatient follow-up with [name] 6381-month consideration of aneurysm coiling/intervention 2. I have changed HTN meds to 12.5 HCTZ as well as Norvasc which was added and patient will need close follow-up with primary care physician regarding strict stringent control below 140 because of her aneurysms 3. Get Chem-12, CBC 1 week 4. Suggest outpatient referral to psychiatry given her anxiety and bipolar 5. Suggest either trazodone at night/Remeron and or Megace once followed up with PCP for weight gain  Discharge Diagnoses:  Principal Problem:   Carotid artery dissection (HCC) Active Problems:   Anxiety state   GERD   Essential hypertension   Hypokalemia   Carotid-cavernous fistula   Underweight   AKI (acute kidney injury) (HCC)   Discharge Condition: Improved  Diet recommendation: Liberalize to regular  There were no vitals filed for this visit.  History of present illness:  [age] white female HTN PE 2005+ DVT hiatal hernia GERD Barrett's esophagus IDA, B12 deficiency gout bipolar Very well-known to neuro interventional-has bilateral ICA aneurysms and was referred by [name] to Dr. Parks Rangereborah Gordon-underwent 3 part endovascular staged embolization 10/23/2018 with coiling and stenting and 12/05/2018 She is also been followed by [name] of ophthalmology in the past  Presented to [name], ED 9/23 transient visual symptoms but states that this was longer than her usual episodic events and lasted about 2 hours  It was recommended by neurology as well as neuro IR that patient be admitted as she has a marfanoid habitus Cardiology saw the patient and did a consult and felt that patient was stabilized for further outpatient  work-up and they reviewed echoes as well as CT scans of the chest which did not show any impending issues Dr. Darcella Cheshireeborah Gordon saw the patient on 9/24 and felt that patient was okay to be discharged home he recommended high-dose aspirin 162 mg a day and I change the patient's HCTZ from 25-12.5 because of AKI on admission and added amlodipine as well Patient will need titration of blood pressure medications to keep systolic below 140-my recommendation would be to use a beta-blocker in the outpatient setting which has less cardioselective T so it crosses blood-brain barrier and this can be discussed with her outpatient physician  She was stabilized from my perspective for discharge home  Awake alert coherent no distress Discharge Exam: Vitals:   03/14/19 0305 03/14/19 0833  BP: 114/80 (!) 131/93  Pulse: 77 70  Resp: 18 18  Temp: 98.6 F (37 C) 98.1 F (36.7 C)  SpO2: 100% 100%    General: Awake alert coherent no distress EOMI NCAT no focal deficits slight diplopia when going to the visual field on the right upper temporal No weakness no focal deficit, power 5/5 bilaterally sensory grossly intact Cardiovascular: S1-S2 no murmur rub or gallop no rales no rhonchi telemetry shows normal sinus rhythm Respiratory: Clinically clear no added sound Skin intact Anxious affect and tangential  Discharge Instructions  Allergies as of 03/14/2019      Reactions   Guaifenesin Shortness Of Breath   Iron Anaphylaxis, Other (See Comments)   IV only   Doxepin Hives   Pseudoeph-doxylamine-dm-apap Hives   Pseudoephedrine Hives   Sulfa Antibiotics Hives   Hydrocodone-acetaminophen    UNSPECIFIED REACTION    Metoprolol Succinate Swelling  UNSPECIFIED EDEMA   Celexa [citalopram Hydrobromide] Other (See Comments)   HEADACHE    Codeine Other (See Comments)   tiredness   Hydromorphone Hcl Nausea And Vomiting   Morphine Other (See Comments)   Does not work; hallucinations and irritable    Pantoprazole Sodium Other (See Comments)   heartburn   Prednisone Rash   Tetracyclines & Related Rash      Medication List    STOP taking these medications   allopurinol 100 MG tablet Commonly known as: ZYLOPRIM   multivitamin with minerals Tabs tablet     TAKE these medications   amLODipine 10 MG tablet Commonly known as: NORVASC Take 1 tablet (10 mg total) by mouth daily.   aspirin 81 MG chewable tablet Chew 2 tablets (162 mg total) by mouth daily. What changed: how much to take   diazepam 5 MG tablet Commonly known as: VALIUM Take 1 tablet (5 mg total) by mouth every 12 (twelve) hours as needed for anxiety. What changed: when to take this   ferrous sulfate 325 (65 FE) MG tablet Take 325 mg by mouth daily with breakfast.   hydrochlorothiazide 12.5 MG tablet Commonly known as: HYDRODIURIL Take 2 tablets (25 mg total) by mouth daily. What changed: medication strength   Klor-Con M20 20 MEQ tablet Generic drug: potassium chloride SA TAKE 1 TABLET BY MOUTH EVERY DAY What changed: how much to take   polyvinyl alcohol 1.4 % ophthalmic solution Commonly known as: LIQUIFILM TEARS Place 1 drop into the left eye 4 (four) times daily. What changed: when to take this   promethazine 12.5 MG tablet Commonly known as: PHENERGAN Take 1 tablet (12.5 mg total) by mouth every 8 (eight) hours as needed for nausea or vomiting.   Systane Balance 0.6 % Soln Generic drug: Propylene Glycol Place 1 drop into the left eye 2 (two) times daily.   timolol 0.5 % ophthalmic solution Commonly known as: TIMOPTIC Place 1 drop into the left eye 2 (two) times daily.   Vitamin D3 50 MCG (2000 UT) Tabs Take 2,000 Units by mouth daily.      Allergies  Allergen Reactions  . Guaifenesin Shortness Of Breath  . Iron Anaphylaxis and Other (See Comments)    IV only  . Doxepin Hives  . Pseudoeph-Doxylamine-Dm-Apap Hives  . Pseudoephedrine Hives  . Sulfa Antibiotics Hives  .  Hydrocodone-Acetaminophen     UNSPECIFIED REACTION   . Metoprolol Succinate Swelling    UNSPECIFIED EDEMA  . Celexa [Citalopram Hydrobromide] Other (See Comments)    HEADACHE   . Codeine Other (See Comments)    tiredness  . Hydromorphone Hcl Nausea And Vomiting  . Morphine Other (See Comments)    Does not work; hallucinations and irritable  . Pantoprazole Sodium Other (See Comments)    heartburn  . Prednisone Rash  . Tetracyclines & Related Rash      The results of significant diagnostics from this hospitalization (including imaging, microbiology, ancillary and laboratory) are listed below for reference.    Significant Diagnostic Studies: Ct Angio Head W Or Wo Contrast  Result Date: 03/13/2019 CLINICAL DATA:  Initial evaluation for worsening double vision, vomiting. History of CC fistula. EXAM: CT ANGIOGRAPHY HEAD AND NECK TECHNIQUE: Multidetector CT imaging of the head and neck was performed using the standard protocol during bolus administration of intravenous contrast. Multiplanar CT image reconstructions and MIPs were obtained to evaluate the vascular anatomy. Carotid stenosis measurements (when applicable) are obtained utilizing NASCET criteria, using the distal internal carotid  diameter as the denominator. CONTRAST:  80mL OMNIPAQUE IOHEXOL 350 MG/ML SOLN COMPARISON:  Prior MRI/MRA from 02/07/2019 as well as previous CTA from 10/26/2018. 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 No acute intracranial hemorrhage. No acute large vessel territory infarct. No mass lesion, mass effect, or midline shift. No hydrocephalus. No extra-axial fluid collection. Scattered nonspecific cerebral white matter changes grossly stable. Vascular: No hyperdense vessel. Skull: Scalp soft tissues and calvarium within normal limits. Sinuses: Mild layering opacity noted within the left  sphenoid sinus. Paranasal sinuses are otherwise clear. No mastoid effusion. Orbits: Partially visualized globes and orbital soft tissues grossly unremarkable. CTA NECK FINDINGS Aortic arch: Visualized aortic arch normal in caliber with normal 3 vessel morphology. Mild atheromatous change at the origin of the left subclavian artery without significant stenosis. No hemodynamically significant stenosis about the origin of the great vessels. Visualized subclavian arteries widely patent. Right carotid system: Right CCA patent from its origin to the bifurcation without stenosis or other abnormality. Irregular somewhat linear filling defect about the right bifurcation favored to be artifactual, although a possible small dissection flap not entirely excluded (series 9, image 113). No significant atheromatous stenosis about the right bifurcation. Right ICA widely patent distally to the skull base. Distal cervical ICA dolichoectatic. Associated aneurysm relatively similar measuring approximately 10 x 6 mm. Left carotid system: Left common carotid artery widely patent proximally. There is a linear dissection flap extending from the proximal through the mid left CCA, seen on prior exam, although more prominent on today's study (series 9, image 119). No significant intimal irregularity or intraluminal thrombus. Left CCA remains widely patent to the bifurcation. No significant narrowing about the left bifurcation itself. 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 Vertebral arteries: Both vertebral arteries arise from the subclavian arteries. Left vertebral artery dominant. There is a new fusiform aneurysm measuring 10 x 7 mm involving the left V2 segment at the level of C4-5 (series 8, image 230). 5 mm aneurysm involving the distal right V2 segment at the level of the C2 foramen is  relatively stable (series 8, image 194). Vertebral arteries otherwise widely patent and unchanged. Skeleton: No acute osseous finding. No discrete lytic or blastic osseous lesions Other neck: No other acute soft tissue abnormality within the neck. Upper chest: Layering fluid noted within the upper esophageal lumen. Scattered emphysematous changes noted within the lungs. Visualized upper chest demonstrates no other acute finding. Review of the MIP images confirms the above findings CTA HEAD FINDINGS Anterior circulation: Visualized petrous segments patent. Extensive streak artifacts seen about the cavernous sinus related to previous coil embolization, limiting assessment. Atheromatous plaque at the para clinoid left ICA with mild to moderate stenosis is unchanged. ICA termini well perfused. A1 segments patent and stable. Normal anterior communicating artery complex. Anterior cerebral arteries remain well opacified and patent. M1 segments widely patent. Negative MCA bifurcations. Distal MCA branches well perfused. Extensive asymmetric prominence of the venous system within the right cerebral hemisphere is now seen, presumably secondary to prior coil embolization about the cavernous sinus. Right inferior petrosal sinus dilated and prominent. Multiple prominent venous collaterals also extend cephalad along the anterior falx. Posterior circulation: Vertebral arteries widely patent to the vertebrobasilar junction and are stable in appearance. Posterior inferior cerebral arteries patent  bilaterally visualized basilar patent proximally and distally without obvious stenosis, although assessment limited by streak artifact. Superior cerebral arteries patent bilaterally. Both PCAs remain patent and well opacified to their distal aspects. Venous sinuses: Major dural sinuses remain patent. Multiple new venous collaterals along the right inferior petrosal sinus and right cerebral hemisphere related to coil embolization of the CC  fistula. Left superior orbital vein has been embolized. Anatomic variants: None significant. Review of the MIP images confirms the above findings IMPRESSION: CT HEAD IMPRESSION: Stable appearance of the brain. No acute intracranial abnormality identified. CTA HEAD AND NECK IMPRESSION: 1. Extensive streak artifact from prior coil embolization of cavernous carotid fistula, limiting assessment. Evaluation for ongoing cc fistulous connection limited on this exam. 2. New dissection flap involving the proximal-mid left common carotid artery without significant narrowing. 3. 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 4. 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 5. Unchanged distal cervical ICA aneurysms as compared to previous. 6. 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 7. Interval development of multiple prominent venous collaterals throughout the right cerebral hemisphere related to altered flow dynamics from previous coil embolization of CC fistula. Critical Value/emergent results were called by telephone at the time of interpretation on 03/13/2019 at 4:53 am to providerKRISTEN WARD , who verbally acknowledged these results. Electronically Signed   By: Rise Mu M.D.   On: 03/13/2019 04:53   Ct Angio Neck W And/or Wo Contrast  Result Date: 03/13/2019 CLINICAL DATA:  Initial evaluation for worsening double vision, vomiting. History of CC fistula. EXAM: CT ANGIOGRAPHY HEAD AND NECK TECHNIQUE: Multidetector CT imaging of the head and neck was performed using the standard protocol during bolus administration of intravenous contrast. Multiplanar CT image reconstructions and MIPs were obtained to evaluate the vascular anatomy. Carotid stenosis measurements (when applicable) are obtained utilizing NASCET criteria, using the distal internal carotid diameter as the denominator. CONTRAST:  23mL OMNIPAQUE IOHEXOL 350 MG/ML SOLN COMPARISON:   Prior MRI/MRA from 02/07/2019 as well as previous CTA from 10/26/2018. 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 No acute intracranial hemorrhage. No acute large vessel territory infarct. No mass lesion, mass effect, or midline shift. No hydrocephalus. No extra-axial fluid collection. Scattered nonspecific cerebral white matter changes grossly stable. Vascular: No hyperdense vessel. Skull: Scalp soft tissues and calvarium within normal limits. Sinuses: Mild layering opacity noted within the left sphenoid sinus. Paranasal sinuses are otherwise clear. No mastoid effusion. Orbits: Partially visualized globes and orbital soft tissues grossly unremarkable. CTA NECK FINDINGS Aortic arch: Visualized aortic arch normal in caliber with normal 3 vessel morphology. Mild atheromatous change at the origin of the left subclavian artery without significant stenosis. No hemodynamically significant stenosis about the origin of the great vessels. Visualized subclavian arteries widely patent. Right carotid system: Right CCA patent from its origin to the bifurcation without stenosis or other abnormality. Irregular somewhat linear filling defect about the right bifurcation favored to be artifactual, although a possible small dissection flap not entirely excluded (series 9, image 113). No significant atheromatous stenosis about the right bifurcation. Right ICA widely patent distally to the skull base. Distal cervical ICA dolichoectatic. Associated aneurysm relatively similar measuring approximately 10 x 6 mm. Left carotid system: Left common carotid artery widely patent proximally. There is a linear dissection flap extending from the proximal through the mid left  CCA, seen on prior exam, although more prominent on today's study (series 9, image 119). No significant intimal irregularity or intraluminal thrombus.  Left CCA remains widely patent to the bifurcation. No significant narrowing about the left bifurcation itself. 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 Vertebral arteries: Both vertebral arteries arise from the subclavian arteries. Left vertebral artery dominant. There is a new fusiform aneurysm measuring 10 x 7 mm involving the left V2 segment at the level of C4-5 (series 8, image 230). 5 mm aneurysm involving the distal right V2 segment at the level of the C2 foramen is relatively stable (series 8, image 194). Vertebral arteries otherwise widely patent and unchanged. Skeleton: No acute osseous finding. No discrete lytic or blastic osseous lesions Other neck: No other acute soft tissue abnormality within the neck. Upper chest: Layering fluid noted within the upper esophageal lumen. Scattered emphysematous changes noted within the lungs. Visualized upper chest demonstrates no other acute finding. Review of the MIP images confirms the above findings CTA HEAD FINDINGS Anterior circulation: Visualized petrous segments patent. Extensive streak artifacts seen about the cavernous sinus related to previous coil embolization, limiting assessment. Atheromatous plaque at the para clinoid left ICA with mild to moderate stenosis is unchanged. ICA termini well perfused. A1 segments patent and stable. Normal anterior communicating artery complex. Anterior cerebral arteries remain well opacified and patent. M1 segments widely patent. Negative MCA bifurcations. Distal MCA branches well perfused. Extensive asymmetric prominence of the venous system within the right cerebral hemisphere is now seen, presumably secondary to prior coil embolization about the cavernous sinus. Right inferior petrosal sinus dilated and prominent. Multiple prominent venous collaterals also extend cephalad  along the anterior falx. Posterior circulation: Vertebral arteries widely patent to the vertebrobasilar junction and are stable in appearance. Posterior inferior cerebral arteries patent bilaterally visualized basilar patent proximally and distally without obvious stenosis, although assessment limited by streak artifact. Superior cerebral arteries patent bilaterally. Both PCAs remain patent and well opacified to their distal aspects. Venous sinuses: Major dural sinuses remain patent. Multiple new venous collaterals along the right inferior petrosal sinus and right cerebral hemisphere related to coil embolization of the CC fistula. Left superior orbital vein has been embolized. Anatomic variants: None significant. Review of the MIP images confirms the above findings IMPRESSION: CT HEAD IMPRESSION: Stable appearance of the brain. No acute intracranial abnormality identified. CTA HEAD AND NECK IMPRESSION: 1. Extensive streak artifact from prior coil embolization of cavernous carotid fistula, limiting assessment. Evaluation for ongoing cc fistulous connection limited on this exam. 2. New dissection flap involving the proximal-mid left common carotid artery without significant narrowing. 3. 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 4. 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 5. Unchanged distal cervical ICA aneurysms as compared to previous. 6. 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 7. Interval development of multiple prominent venous collaterals throughout the right cerebral hemisphere related to altered flow dynamics from previous coil embolization of CC fistula. Critical Value/emergent results were called by telephone at the time of interpretation on 03/13/2019 at 4:53 am to providerKRISTEN WARD , who verbally acknowledged these results. Electronically Signed   By: Rise Mu M.D.   On: 03/13/2019 04:53   Mr Brain Wo  Contrast  Result Date: 03/13/2019 CLINICAL DATA:  History of  carotid cavernous fistula. Carotid artery dissection. Evaluate for infarct. EXAM: MRI HEAD WITHOUT CONTRAST TECHNIQUE: Multiplanar, multiecho pulse sequences of the brain and surrounding structures were obtained without intravenous contrast. COMPARISON:  02/07/2019 FINDINGS: Brain: No acute infarction, hemorrhage, hydrocephalus, extra-axial collection or mass lesion. Few remote white matter insults are stable and mainly periventricular. Normal brain volume Vascular: Abnormal number of asymmetric vessels along the surface of the right brain as recently evaluated by CTA. Bulbous cervical and cavernous ICA, reference prior CTA. Skull and upper cervical spine: Negative for marrow lesion Sinuses/Orbits: Negative IMPRESSION: Negative for acute infarct or other interval finding. Electronically Signed   By: Marnee Spring M.D.   On: 03/13/2019 07:33    Microbiology: Recent Results (from the past 240 hour(s))  SARS Coronavirus 2 [facility] order, Performed in [facility] hospital lab) Nasopharyngeal Nasopharyngeal Swab     Status: None   Collection Time: 03/13/19  6:23 AM   Specimen: Nasopharyngeal Swab  Result Value Ref Range Status   SARS Coronavirus 2 NEGATIVE NEGATIVE Final    Comment: (NOTE) If result is NEGATIVE SARS-CoV-2 target nucleic acids are NOT DETECTED. The SARS-CoV-2 RNA is generally detectable in upper and lower  respiratory specimens during the acute phase of infection. The lowest  concentration of SARS-CoV-2 viral copies this assay can detect is 250  copies / mL. A negative result does not preclude SARS-CoV-2 infection  and should not be used as the sole basis for treatment or other  patient management decisions.  A negative result may occur with  improper specimen collection / handling, submission of specimen other  than nasopharyngeal swab, presence of viral mutation(s) within the  areas targeted by this assay, and  inadequate number of viral copies  (<250 copies / mL). A negative result must be combined with clinical  observations, patient history, and epidemiological information. If result is POSITIVE SARS-CoV-2 target nucleic acids are DETECTED. The SARS-CoV-2 RNA is generally detectable in upper and lower  respiratory specimens dur ing the acute phase of infection.  Positive  results are indicative of active infection with SARS-CoV-2.  Clinical  correlation with patient history and other diagnostic information is  necessary to determine patient infection status.  Positive results do  not rule out bacterial infection or co-infection with other viruses. If result is PRESUMPTIVE POSTIVE SARS-CoV-2 nucleic acids MAY BE PRESENT.   A presumptive positive result was obtained on the submitted specimen  and confirmed on repeat testing.  While 2019 novel coronavirus  (SARS-CoV-2) nucleic acids may be present in the submitted sample  additional confirmatory testing may be necessary for epidemiological  and / or clinical management purposes  to differentiate between  SARS-CoV-2 and other Sarbecovirus currently known to infect humans.  If clinically indicated additional testing with an alternate test  methodology [phone]) is advised. The SARS-CoV-2 RNA is generally  detectable in upper and lower respiratory sp ecimens during the acute  phase of infection. The expected result is Negative. Fact Sheet for Patients:  BoilerBrush.com.cy Fact Sheet for Healthcare Providers: https://pope.com/ This test is not yet approved or cleared by the [place] FDA and has been authorized for detection and/or diagnosis of SARS-CoV-2 by FDA under an Emergency Use Authorization (EUA).  This EUA will remain in effect (meaning this test can be used) for the duration of the COVID-19 declaration under Section 564(b)(1) of the Act, 21 U.S.C. section 360bbb-3(b)(1), unless the  authorization is terminated or revoked sooner. Performed at [facility], [address] [place],  [place] [address]      Labs: Basic Metabolic Panel: Recent Labs  Lab 03/13/19 0209 03/14/19 0322  NA 137 140  K 2.9* 3.6  CL 103 111  CO2 24 22  GLUCOSE 119* 90  BUN 16 12  CREATININE 1.15* 0.73  CALCIUM 9.4 8.2*  MG  --  1.8   Liver Function Tests: No results for input(s): AST, ALT, ALKPHOS, BILITOT, PROT, ALBUMIN in the last 168 hours. No results for input(s): LIPASE, AMYLASE in the last 168 hours. No results for input(s): AMMONIA in the last 168 hours. CBC: Recent Labs  Lab 03/13/19 0209 03/14/19 0322  WBC 10.3 8.2  NEUTROABS 6.8  --   HGB 14.8 12.7  HCT 42.7 38.0  MCV 87.5 88.0  PLT 292 259   Cardiac Enzymes: No results for input(s): CKTOTAL, CKMB, CKMBINDEX, TROPONINI in the last 168 hours. BNP: BNP (last 3 results) No results for input(s): BNP in the last 8760 hours.  ProBNP (last 3 results) No results for input(s): PROBNP in the last 8760 hours.  CBG: No results for input(s): GLUCAP in the last 168 hours.     Signed:  Nita Sells MD   [facility] 03/14/2019, 9:47 AM started

## 2019-03-15 ENCOUNTER — Telehealth: Payer: Self-pay | Admitting: *Deleted

## 2019-03-15 NOTE — Telephone Encounter (Signed)
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 Pt was having transient visual symptoms but states that this was longer than her usual episodic events up to 2 hours. Pt had consult w/cardiologist and felt that patient was stabilized for further outpatient work-up and they reviewed echoes as well as CT scans of the chest which did not show any impending issues. Pt D/C 03/14/19, and will follow-up w/cardiology Skeet Latch, MD (Cardiology) on 04/16/2019.Marland KitchenJohny Chess

## 2019-03-19 ENCOUNTER — Telehealth: Payer: Self-pay | Admitting: *Deleted

## 2019-03-19 NOTE — Telephone Encounter (Signed)
Medical records letter, imaging reports faxed to [phone] @ [facility] and [phone] @ [address] claim #[id number], [id number].

## 2019-03-20 ENCOUNTER — Telehealth: Payer: Self-pay

## 2019-03-20 ENCOUNTER — Telehealth (HOSPITAL_COMMUNITY): Payer: Self-pay

## 2019-03-20 NOTE — Telephone Encounter (Signed)
Pt called to cancel mri/mra since she recently had one done in the hospital. AW

## 2019-03-20 NOTE — Telephone Encounter (Signed)
Letter has been mailed to the pt asking her to call in an reschedule her 06/25/2019 appt due to provider's schedule change.

## 2019-03-21 ENCOUNTER — Encounter (HOSPITAL_COMMUNITY): Payer: Self-pay

## 2019-03-21 ENCOUNTER — Ambulatory Visit (HOSPITAL_COMMUNITY): Admission: RE | Admit: 2019-03-21 | Payer: 59 | Source: Ambulatory Visit

## 2019-03-21 ENCOUNTER — Ambulatory Visit (HOSPITAL_COMMUNITY): Payer: 59

## 2019-04-01 ENCOUNTER — Ambulatory Visit: Payer: 59 | Admitting: Neurology

## 2019-04-02 ENCOUNTER — Encounter: Payer: Self-pay | Admitting: Internal Medicine

## 2019-04-03 ENCOUNTER — Telehealth: Payer: Self-pay | Admitting: Neurology

## 2019-04-03 ENCOUNTER — Encounter: Payer: Self-pay | Admitting: Neurology

## 2019-04-03 ENCOUNTER — Ambulatory Visit (INDEPENDENT_AMBULATORY_CARE_PROVIDER_SITE_OTHER): Payer: Managed Care, Other (non HMO) | Admitting: Neurology

## 2019-04-03 ENCOUNTER — Other Ambulatory Visit: Payer: Self-pay

## 2019-04-03 VITALS — BP 157/100 | HR 85 | Temp 98.4°F | Ht 67.0 in | Wt 115.0 lb

## 2019-04-03 DIAGNOSIS — I7771 Dissection of carotid artery: Secondary | ICD-10-CM | POA: Diagnosis not present

## 2019-04-03 DIAGNOSIS — G43709 Chronic migraine without aura, not intractable, without status migrainosus: Secondary | ICD-10-CM

## 2019-04-03 DIAGNOSIS — I671 Cerebral aneurysm, nonruptured: Secondary | ICD-10-CM | POA: Diagnosis not present

## 2019-04-03 DIAGNOSIS — Q8740 Marfan's syndrome, unspecified: Secondary | ICD-10-CM

## 2019-04-03 NOTE — Telephone Encounter (Signed)
Pt requesting a call to discuss when she has a headache if she is ok to take tylenol extra strength. Please advise

## 2019-04-03 NOTE — Telephone Encounter (Signed)
Pt has been informed of what RN stated

## 2019-04-03 NOTE — Telephone Encounter (Signed)
Dr. [name] did not mention the pt could not try otc tylenol during his office visit. I advised [name] this would be ok to try and she is going to notify the pt.

## 2019-04-03 NOTE — Progress Notes (Signed)
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  She has incomplete vertical deviation of the left eye that has been persistent and has been associated with double vision.  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  The patient has done fairly well since the recent admission, she has been seen by Dr. Alben Spittle from ophthalmology, she was told that her intraocular pressures on the left eye were normal, she is to be evaluated at [facility] for genetic testing and for a cardiology evaluation.  The patient returns to the office today for an evaluation.  She currently is out of work until the first of the year, she does not wish to go on long-term disability.  She still has pulsatile tinnitus that has not changed.  There have been no episodes of new visual changes, numbness or weakness of the face, arms, or legs.  Past Medical History:  Diagnosis Date  . Allergic rhinitis   . Anxiety   . B12 deficiency   . Barrett's esophagus   . Carotid artery dissection (HCC)   . Chronic constipation   . 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 Depression   . GERD (gastroesophageal reflux disease)    12/04/2018- not since surgery for Hiatal Hernia   . Head injury with loss of consciousness (HCC) 05/2018  . Hiatal hernia   . History of blood transfusion 2007  . History of DVT of lower extremity    08-14-2003  RIGHT LOWER EXTREMITY-- TREATED W/ COUMADIN----  per pt none since  . History of palpitations    denies  . Hypertension   . Idiopathic chronic gout    12-20-2017  per pt stable ,   last episode 02/ 2019  . Iron deficiency anemia   . Migraines   . Personal history of PE (pulmonary embolism)    03/ 2005 RIGHT SIDE  --- treated w/ coumadin---  per pt none since  . Pneumonia 2019  . PONV (postoperative nausea and vomiting)   . Tinea versicolor   . Vitamin D deficiency     Past Surgical History:  Procedure Laterality Date  . ANEURYSM COILING     (10/23/2018 using coiling and stenting, 10/31/2018 using coiling, and 12/05/2018 using coils).  Marland Kitchen BIOPSY  11/29/2017   Procedure: BIOPSY;  Surgeon: [name], MD;  Location: [facility];  Service: Endoscopy;;  . BRONCHOSCOPY  05-20-2004  dr clance  . ESOPHAGOGASTRODUODENOSCOPY (EGD) WITH PROPOFOL N/A 11/29/2017   Procedure: ESOPHAGOGASTRODUODENOSCOPY (EGD) WITH PROPOFOL;  Surgeon: [name], MD;  Location: [facility];  Service: Endoscopy;  Laterality: N/A;  . EXPLORATORY LAPAROTOMY  AGE 49    [facility]   for constipation  .  INSERTION OF MESH N/A 12/26/2017   Procedure: INSERTION OF BIO A MESH;  Surgeon: Axel Filleramirez, Armando, MD;  Location: [facility];  Service: General;  Laterality: N/A;  . IR ANGIO INTRA EXTRACRAN SEL COM CAROTID INNOMINATE BILAT MOD SED  10/20/2018  . IR ANGIO INTRA EXTRACRAN SEL COM CAROTID INNOMINATE UNI R MOD SED  10/31/2018  . IR ANGIO INTRA EXTRACRAN SEL INTERNAL CAROTID UNI L MOD SED  10/23/2018  . IR ANGIO INTRA EXTRACRAN SEL INTERNAL CAROTID UNI L MOD SED  10/31/2018  . IR ANGIO INTRA EXTRACRAN SEL INTERNAL CAROTID UNI L MOD SED  12/05/2018  . IR ANGIO VERTEBRAL SEL SUBCLAVIAN INNOMINATE UNI R MOD SED  10/20/2018  . IR ANGIO VERTEBRAL SEL VERTEBRAL UNI L MOD SED  10/20/2018  . IR  ANGIOGRAM FOLLOW UP STUDY  10/23/2018  . IR ANGIOGRAM FOLLOW UP STUDY  10/31/2018  . IR ANGIOGRAM FOLLOW UP STUDY  12/05/2018  . IR NEURO EACH ADD'L AFTER BASIC UNI LEFT (MS)  10/31/2018  . IR TRANSCATH/EMBOLIZ  10/23/2018  . IR TRANSCATH/EMBOLIZ  10/31/2018  . IR TRANSCATH/EMBOLIZ  12/05/2018  . IR VENO/JUGULAR RIGHT  12/05/2018  . KNEE ARTHROSCOPY W/ ACL RECONSTRUCTION Right 09/2003  . LAPAROSCOPY BILATERAL TUBAL FULGERATION AND ATTEMPTED THERMACHOICE ABLATION WITH UTERINE PERFORATION  07-17-2009   [facility]  . LUNG LOBECTOMY  1991   right lower lobe for massive hemoptyosis  . MINI-THORACOTOMY WEDGE RESECTION OF LEFT LOWER LOBE CYST Left 06-24-2004   [facility]   hem[facility]osis  . RADIOLOGY WITH ANESTHESIA N/A 10/23/2018   Procedure: IR WITH ANESTHESIA FOR EMBOLIZATION;  Surgeon: [name], [name], MD;  Location: [facility] OR;  Service: Radiology;  Laterality: N/A;  . RADIOLOGY WITH ANESTHESIA N/A 10/31/2018   Procedure: EMBOLIZATION;  Surgeon: [name], [name], MD;  Location: [facility] OR;  Service: Radiology;  Laterality: N/A;  . RADIOLOGY WITH ANESTHESIA N/A 12/05/2018   Procedure: EMBOLIZATION;  Surgeon: [name], [name], MD;  Location: [facility] OR;  Service: Radiology;  Laterality: N/A;    Family History  Problem Relation Age of Onset  . Hypertension Other   . Diabetes Other   . Diverticulitis Mother   . COPD Father   . Sudden death Neg Hx     Social history:  reports that she has never smoked. She has never used smokeless tobacco. She reports current alcohol use. She reports that she does not use drugs.    Allergies  Allergen Reactions  . Guaifenesin Shortness Of Breath  . Iron Anaphylaxis and Other (See Comments)    IV only  . Doxepin Hives  . Pseudoeph-Doxylamine-Dm-Apap Hives  . Pseudoephedrine Hives  . Sulfa Antibiotics Hives  . Hydrocodone-Acetaminophen     UNSPECIFIED REACTION   . Metoprolol Succinate Swelling    UNSPECIFIED EDEMA  . Celexa [Citalopram Hydrobromide] Other (See  Comments)    HEADACHE   . Codeine Other (See Comments)    tiredness  . Hydromorphone Hcl Nausea And Vomiting  . Morphine Other (See Comments)    Does not work; hallucinations and irritable  . Pantoprazole Sodium Other (See Comments)    heartburn  . Prednisone Rash  . Tetracyclines & Related Rash    Medications:  Prior to Admission medications   Medication Sig Start Date End Date Taking? Authorizing Provider  allopurinol (ZYLOPRIM) 100 MG tablet Take 100 mg by mouth daily.   Yes [provider]  aspirin 81 MG chewable tablet Chew 2 tablets (162 mg total) by mouth daily. 03/14/19  Yes Rhetta MuraSamtani, Jai-Gurmukh,  MD  Cholecalciferol (VITAMIN D3) 2000 units TABS Take 2,000 Units by mouth daily.    Yes [provider]  diazepam (VALIUM) 5 MG tablet Take 1 tablet (5 mg total) by mouth every 12 (twelve) hours as needed for anxiety. Patient taking differently: Take 5 mg by mouth at bedtime.  11/22/18  Yes Plotnikov, Evie Lacks, MD  ferrous sulfate 325 (65 FE) MG tablet Take 325 mg by mouth daily with breakfast.   Yes [provider]  KLOR-CON M20 20 MEQ tablet TAKE 1 TABLET BY MOUTH EVERY DAY Patient taking differently: Take 20 mEq by mouth daily.  11/13/18  Yes Plotnikov, Evie Lacks, MD  polyvinyl alcohol (LIQUIFILM TEARS) 1.4 % ophthalmic solution Place 1 drop into the left eye 4 (four) times daily. Patient taking differently: Place 1 drop into the left eye 2 (two) times a day.  11/02/18  Yes Domenic Polite, MD  promethazine (PHENERGAN) 12.5 MG tablet Take 1 tablet (12.5 mg total) by mouth every 8 (eight) hours as needed for nausea or vomiting. 02/12/19  Yes Kathrynn Ducking, MD  Propylene Glycol (SYSTANE BALANCE) 0.6 % SOLN Place 1 drop into the left eye 2 (two) times daily.   Yes [provider]  timolol (TIMOPTIC) 0.5 % ophthalmic solution Place 1 drop into the left eye 2 (two) times daily. 11/02/18  Yes Domenic Polite, MD  amLODipine (NORVASC) 10 MG tablet Take 1  tablet (10 mg total) by mouth daily. Patient not taking: Reported on 04/03/2019 03/14/19 03/13/20  Nita Sells, MD  hydrochlorothiazide (HYDRODIURIL) 12.5 MG tablet Take 2 tablets (25 mg total) by mouth daily. Patient not taking: Reported on 04/03/2019 03/14/19   Nita Sells, MD    ROS:  Out of a complete 14 system review of symptoms, the patient complains only of the following symptoms, and all other reviewed systems are negative.  Pulsatile tinnitus Double vision  Blood pressure (!) 157/100, pulse 85, temperature 98.4 F (36.9 C), height 5\' 7"  (1.702 m), weight 115 lb (52.2 kg).  Physical Exam  General: The patient is alert and cooperative at the time of the examination.  Skin: No significant peripheral edema is noted.   Neurologic Exam  Mental status: The patient is alert and oriented x 3 at the time of the examination. The patient has apparent normal recent and remote memory, with an apparently normal attention span and concentration ability.   Cranial nerves: Facial symmetry is present. Speech is normal, no aphasia or dysarthria is noted. Extraocular movements are full, with exception that there is incomplete superior deviation of the left eye.  On primary gaze and with lateral gaze to the right and left, the left eye is inferior to the right. Visual fields are full.  Pupils are equal, round, and reactive to light.  Discs are flat bilaterally.  Motor: The patient has good strength in all 4 extremities.  Sensory examination: Soft touch sensation is symmetric on the face, arms, and legs.  Coordination: The patient has good finger-nose-finger and heel-to-shin bilaterally.  Gait and station: The patient has a normal gait. Tandem gait is normal. Romberg is negative. No drift is seen.  Reflexes: Deep tendon reflexes are symmetric.   CTA head and neck 03/13/19:  CTA HEAD AND NECK IMPRESSION:  1. Extensive streak artifact from prior coil embolization of  cavernous carotid fistula, limiting assessment. Evaluation for ongoing cc fistulous connection limited on this exam. 2. New dissection flap involving the proximal-mid left common carotid artery without significant narrowing. 3. New  3 mm aneurysm/pseudoaneurysm extending from the proximal cervical left ICA as above. 4. 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 5. Unchanged distal cervical ICA aneurysms as compared to previous. 6. 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 7. Interval development of multiple prominent venous collaterals throughout the right cerebral hemisphere related to altered flow dynamics from previous coil embolization of CC fistula.   MRI brain 03/13/19:  IMPRESSION: Negative for acute infarct or other interval finding.    Assessment/Plan:  1.  Probable Marfan syndrome  2.  Left C-C fistula  3.  Multiple cerebral aneurysms  The patient has had significant changes in her CTA findings of the head and neck in just a few months over the summer.  This is worrisome that the cerebrovascular issues are quite unstable.  The patient will be getting genetic testing in the near future and a cardiology evaluation.  She has been seen through Dr. Corliss Skains previously, he does not wish to perform any other ablation procedures in the near future.  I will see if our vascular neurology physician, Dr. Pearlean Brownie, would be willing to see this patient.  The patient will otherwise follow-up with me in 4 months.    Marlan Palau MD 04/03/2019 7:28 AM  [facility] [address] [place], [place] [address]  Phone [phone] Fax [phone]

## 2019-04-09 ENCOUNTER — Telehealth: Payer: Self-pay

## 2019-04-09 NOTE — Telephone Encounter (Signed)
I reached out to the pt and left a vm asking for a call back. Dr. [name] has requested pt see Dr. Leonie Man for Cerebrovascular disease. Dr. Leonie Man has agreed. When pt calls back please offer 05/06/2019 at 130 pm. I have received the ok to use this work in slot.

## 2019-04-09 NOTE — Telephone Encounter (Signed)
-----   Message from Kathrynn Ducking, MD sent at 04/04/2019  5:49 PM EDT ----- Please have this patient set up to see Dr. Leonie Man, he has agreed to see the patient in regards to her cerebrovascular disease.

## 2019-04-11 ENCOUNTER — Telehealth: Payer: Self-pay

## 2019-04-11 NOTE — Telephone Encounter (Signed)
Requested leave paper work has been reviewed and completed for the pt per [place], MD. Forms have been fwd back to medical records for processing.

## 2019-04-11 NOTE — Telephone Encounter (Signed)
Pt called back and accepted appt 

## 2019-04-16 ENCOUNTER — Telehealth: Payer: Self-pay | Admitting: *Deleted

## 2019-04-16 ENCOUNTER — Ambulatory Visit (INDEPENDENT_AMBULATORY_CARE_PROVIDER_SITE_OTHER): Payer: Managed Care, Other (non HMO) | Admitting: Cardiovascular Disease

## 2019-04-16 ENCOUNTER — Encounter: Payer: Self-pay | Admitting: Cardiovascular Disease

## 2019-04-16 ENCOUNTER — Other Ambulatory Visit: Payer: Self-pay

## 2019-04-16 VITALS — BP 126/88 | HR 90 | Ht 67.0 in | Wt 112.0 lb

## 2019-04-16 DIAGNOSIS — I10 Essential (primary) hypertension: Secondary | ICD-10-CM | POA: Diagnosis not present

## 2019-04-16 DIAGNOSIS — Q8740 Marfan's syndrome, unspecified: Secondary | ICD-10-CM

## 2019-04-16 MED ORDER — AMLODIPINE BESYLATE 5 MG PO TABS: 5.0000 mg | ORAL_TABLET | Freq: Every day | ORAL | 1 refills | Status: DC

## 2019-04-16 NOTE — Telephone Encounter (Signed)
Pt [employer] form faxed on 04/11/19 to [phone]

## 2019-04-16 NOTE — Patient Instructions (Addendum)
Medication Instructions:  START AMLODIPINE 5 MG DAILY IF YOUR BLOOD PRESSURE REMAINS ABOVE 130/80  *If you need a refill on your cardiac medications before your next appointment, please call your pharmacy*  Lab Work: NONE  Testing/Procedures: NONE   Follow-Up: At [facility], you and your health needs are our priority.  As part of our continuing mission to provide you with exceptional heart care, we have created designated Provider Care Teams.  These Care Teams include your primary Cardiologist (physician) and Advanced Practice Providers (APPs -  Physician Assistants and Nurse Practitioners) who all work together to provide you with the care you need, when you need it.  Your next appointment:   Your physician recommends that you schedule a follow-up appointment in: [place]  12 months   You will receive a reminder letter in the mail two months in advance. If you don't receive a letter, please call our office to schedule the follow-up appointment.   The format for your next appointment:   Either In Person or Virtual  Provider:   You may see Skeet Latch, MD   or one of the following Advanced Practice Providers on your designated Care Team:    Kerin Ransom, PA-C  [place], [place]  Coletta Memos, [place]   Other Instructions   MONITOR YOUR BLOOD PRESSURE FOR 1 WEEK. IF IT REMAINS ABOVE 130/80 START AMLODIPINE 5 MG DAILY  CONTINUE TO MONITOR AND LOG BLOOD PRESSURE. BRING YOUR READINGS AND MACHINE TO FOLLOW UP IN 1 MONTH

## 2019-04-16 NOTE — Progress Notes (Signed)
Cardiology Office Note   Date:  04/16/2019   ID:  [name], DOB [birth year], MRN [id number]  PCP:  Cassandria Anger, MD  Cardiologist:   Skeet Latch, MD    History of Present Illness: [name] is a 48 y.o. female with L carotid-cavernous fistula s/p repeat embolization, cystic lung disease s/p lobectomy, chronic anemia, hypertension, Barrett's esophagus s/p Nissen fundoplication, and DVT/PE here for follow up.  [name] has required several endovascular interventions for the L carotid cavernous fistula. Dr. Kathi Ludwig has done her previous interventionsa dn recommended that she be worked up for [name] at [facility] or [place].   The first available appointments were in 6-9 months.  She last saw Dr. [name] on 8/25 and was told that it was enlarging and she would require another intervention.  She developed visual changes and presented to the ED on 02/2019.  She reported seeing flashes of lights and zaps in her left eye that were new.  There were no acute cardiac findings, but given that she was in the work-up of possible Marfan's syndrome cardiology was consulted to expedite this work-up.  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  She was noted to have  arachnodactyly, scoliosis, tall/thin frame, mild pectus carinatum, crowded teeth, and flat feet.  Positive thumb and wrist sign.  She has no history of spontaneous pneumothorax but did have lung cysts at age 48 that required lobectomy.  She has no hyper flexibility or increased elasticity of the skin.  She previously had a DVT/PE in the setting of an ACL tear.  She denied any family history of connective tissue disease or sudden cardiac death.  Her echo and chest CT from 05/2018 were reviewed.  Her aortic valve is trileaflet and there was no ascending aorta aneurysm.  She was referred to our geneticist, Dr. 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     Overall [name] has felt well.  She has mild LE edema that is stable with compression stockings.  She has no chest pain and only get short of breath when feeling anxious.  She continues to have diplopia at times and is unsure of the next steps for her  carotid-cavernous fistula.  Since she was seen in the ED she had a consultation with Dr. [name], a cardiologist and geneticist at [facility].  Dr. Manuella Ghazi is concerned she may have Loeys-Dietz syndrome or Drue Dun. She doesn't typically check her BP at home, though she just received a BP cuff from her insurance company last week.   Past Medical History:  Diagnosis Date  . Allergic rhinitis   . Anxiety   . B12 deficiency   . Barrett's esophagus   . 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 Chronic constipation   . Congenital cystic disease of lung    was born premature at @ 39 weeks---  . Depression   . GERD (gastroesophageal reflux disease)    12/04/2018- not since surgery for Hiatal Hernia  . Head injury with loss of consciousness ([place]) 05/2018  . Hiatal hernia   . History of blood transfusion 2007  . History of DVT of lower extremity    08-14-2003  RIGHT LOWER EXTREMITY-- TREATED W/ COUMADIN----  per pt none since  . History of palpitations    denies  . Hypertension   . Idiopathic chronic gout    12-20-2017  per pt stable ,  last episode 02/ 2019  . Iron deficiency anemia   . Migraines   . Personal history of PE (pulmonary embolism)    03/ 2005 RIGHT SIDE  --- treated w/ coumadin---  per pt none since  . Pneumonia 2019  . PONV (postoperative nausea and vomiting)   . Tinea versicolor   . Vitamin D deficiency     Past Surgical History:  Procedure Laterality Date  . ANEURYSM COILING     (10/23/2018 using coiling and stenting, 10/31/2018 using coiling, and 12/05/2018 using coils).  Marland Kitchen BIOPSY  11/29/2017   Procedure: BIOPSY;  Surgeon: [name], MD;  Location: [facility];  Service: Endoscopy;;  . BRONCHOSCOPY  05-20-2004  dr clance   . ESOPHAGOGASTRODUODENOSCOPY (EGD) WITH PROPOFOL N/A 11/29/2017   Procedure: ESOPHAGOGASTRODUODENOSCOPY (EGD) WITH PROPOFOL;  Surgeon: [name], MD;  Location: [facility];  Service: Endoscopy;  Laterality: N/A;  . EXPLORATORY LAPAROTOMY  AGE 48    [facility]   for constipation  . INSERTION OF MESH N/A 12/26/2017   Procedure: INSERTION OF BIO A MESH;  Surgeon: [name], MD;  Location: [facility]S;  Service: General;  Laterality: N/A;  . IR ANGIO INTRA EXTRACRAN SEL COM CAROTID INNOMINATE BILAT MOD SED  10/20/2018  . IR ANGIO INTRA EXTRACRAN SEL COM CAROTID INNOMINATE UNI R MOD SED  10/31/2018  . IR ANGIO INTRA EXTRACRAN SEL INTERNAL CAROTID UNI L MOD SED  10/23/2018  . IR ANGIO INTRA EXTRACRAN SEL INTERNAL CAROTID UNI L MOD SED  10/31/2018  . IR ANGIO INTRA EXTRACRAN SEL INTERNAL CAROTID UNI L MOD SED  12/05/2018  . IR ANGIO VERTEBRAL SEL SUBCLAVIAN INNOMINATE UNI R MOD SED  10/20/2018  . IR ANGIO VERTEBRAL SEL VERTEBRAL UNI L MOD SED  10/20/2018  . IR ANGIOGRAM FOLLOW UP STUDY  10/23/2018  . IR ANGIOGRAM FOLLOW UP STUDY  10/31/2018  . IR ANGIOGRAM FOLLOW UP STUDY  12/05/2018  . IR NEURO EACH ADD'L AFTER BASIC UNI LEFT (MS)  10/31/2018  . IR TRANSCATH/EMBOLIZ  10/23/2018  . IR TRANSCATH/EMBOLIZ  10/31/2018  . IR TRANSCATH/EMBOLIZ  12/05/2018  . IR VENO/JUGULAR RIGHT  12/05/2018  . KNEE ARTHROSCOPY W/ ACL RECONSTRUCTION Right 09/2003  . LAPAROSCOPY BILATERAL TUBAL FULGERATION AND ATTEMPTED THERMACHOICE ABLATION WITH UTERINE PERFORATION  07-17-2009   dr [name]  [facility]  . LUNG LOBECTOMY  1991   right lower lobe for massive hemoptyosis  . MINI-THORACOTOMY WEDGE RESECTION OF LEFT LOWER LOBE CYST Left 06-24-2004   dr Edwyna Shell [facility]   hemoptyosis  . RADIOLOGY WITH ANESTHESIA N/A 10/23/2018   Procedure: IR WITH ANESTHESIA FOR EMBOLIZATION;  Surgeon: Julieanne Cotton, MD;  Location: [facility] OR;  Service: Radiology;  Laterality: N/A;  . RADIOLOGY WITH ANESTHESIA N/A 10/31/2018   Procedure: EMBOLIZATION;  Surgeon: Julieanne Cotton, MD;  Location: [facility] OR;  Service: Radiology;  Laterality: N/A;  . RADIOLOGY WITH ANESTHESIA N/A 12/05/2018   Procedure: EMBOLIZATION;  Surgeon: Julieanne Cotton, MD;  Location: [facility] OR;  Service: Radiology;  Laterality: N/A;     Current Outpatient Medications  Medication Sig Dispense Refill  . allopurinol (ZYLOPRIM) 100 MG tablet Take 100 mg by mouth daily.    Marland Kitchen amLODipine (NORVASC) 10 MG tablet Take 1 tablet (10 mg total) by mouth daily. 30 tablet 11  . aspirin 81 MG chewable tablet Chew 2 tablets (162 mg total) by mouth daily.    . Cholecalciferol (VITAMIN D3) 2000 units TABS Take 2,000 Units by mouth daily.     . diazepam (VALIUM) 5 MG tablet  Take 1 tablet (5 mg total) by mouth every 12 (twelve) hours as needed for anxiety. (Patient taking differently: Take 5 mg by mouth at bedtime. ) 60 tablet 1  . ferrous sulfate 325 (65 FE) MG tablet Take 325 mg by mouth daily with breakfast.    . hydrochlorothiazide (HYDRODIURIL) 12.5 MG tablet Take 2 tablets (25 mg total) by mouth daily.    Marland Kitchen KLOR-CON M20 20 MEQ tablet TAKE 1 TABLET BY MOUTH EVERY DAY (Patient taking differently: Take 20 mEq by mouth daily. ) 90 tablet 3  . polyvinyl alcohol (LIQUIFILM TEARS) 1.4 % ophthalmic solution Place 1 drop into the left eye 4 (four) times daily. (Patient taking differently: Place 1 drop into the left eye 2 (two) times a day. ) 15 mL 0  . promethazine (PHENERGAN) 12.5 MG tablet Take 1 tablet (12.5 mg total) by mouth every 8 (eight) hours as needed for nausea or vomiting. 30 tablet 1  . Propylene Glycol (SYSTANE BALANCE) 0.6 % SOLN Place 1 drop into the left eye 2 (two) times daily.    . timolol (TIMOPTIC) 0.5 % ophthalmic solution Place 1 drop into the left eye 2 (two) times daily. 10 mL 1   No current facility-administered medications for this visit.     Allergies:   Guaifenesin, Iron, Doxepin, Pseudoeph-doxylamine-dm-apap, Pseudoephedrine, Sulfa antibiotics, Hydrocodone-acetaminophen, Metoprolol  succinate, Celexa [citalopram hydrobromide], Codeine, Hydromorphone hcl, Morphine, Pantoprazole sodium, Prednisone, and Tetracyclines & related    Social History:  The patient  reports that she has never smoked. She has never used smokeless tobacco. She reports current alcohol use. She reports that she does not use drugs.   Family History:  The patient's family history includes COPD in her father; Diabetes in an other family member; Diverticulitis in her mother; Hypertension in an other family member.    ROS:  Please see the history of present illness.   Otherwise, review of systems are positive for none.   All other systems are reviewed and negative.    PHYSICAL EXAM: VS:  BP 126/88   Pulse 90   Ht 5\' 7"  (1.702 m)   Wt 112 lb (50.8 kg)   BMI 17.54 kg/m  , BMI Body mass index is 17.54 kg/m. GENERAL:  Well appearing.  Thin HEENT:  Pupils equal round and reactive, fundi not visualized, oral mucosa unremarkable.  Crowded teeth.  Torus palatinus NECK:  No jugular venous distention, waveform within normal limits, carotid upstroke brisk and symmetric, no bruits LUNGS:  Clear to auscultation bilaterally CHEST:: Mild pectus carinatum HEART:  RRR.  PMI not displaced or sustained,S1 and S2 within normal limits, no S3, no S4, no clicks, no rubs, no murmurs ABD:  Flat, positive bowel sounds normal in frequency in pitch, no bruits, no rebound, no guarding, no midline pulsatile mass, no hepatomegaly, no splenomegaly EXT:  2 plus pulses throughout, no edema, no cyanosis no clubbing.  Arachnodactyly.  +thumb sign.   MSK: Scoliosis SKIN:  No rashes no nodules NEURO:  Cranial nerves II through XII grossly intact, motor grossly intact throughout PSYCH:  Cognitively intact, oriented to person place and time  EKG:  EKG is ordered today. The ekg ordered today demonstrates sinus rhythm.  Rate 90 bpm.    Echo 06/14/18: Study Conclusions  - Left ventricle: The cavity size was normal. There was mild  focal   basal hypertrophy of the septum. Systolic function was normal.   The estimated ejection fraction was in the range of 60% to 65%.  Images were inadequate for LV wall motion assessment. Left   ventricular diastolic function parameters were normal. No   definite LVOT flow acceleration or evidence of obstruction. No   systolic anterior motion detected. Imaging suboptimal for   detection of mid-cavitary gradients. - Aortic valve: Transvalvular velocity was within the normal range.   There was no stenosis. There was no regurgitation. - Mitral valve: Transvalvular velocity was within the normal range.   There was no evidence for stenosis. There was trivial   regurgitation. - Left atrium: The atrium was normal in size. - Right ventricle: The cavity size was normal. Wall thickness was   normal. Systolic function was normal. - Right atrium: The atrium was normal in size. - Atrial septum: No defect or patent foramen ovale was identified. - Tricuspid valve: There was trivial regurgitation. - Inferior vena cava: The vessel was normal in size. The   respirophasic diameter changes were in the normal range (= 50%),   consistent with normal central venous pressure. - Pericardium, extracardiac: There was no pericardial effusion.  Impressions:  - Normal biventricular function, LVEF 60-65%. Images inadequate for   the assessment of subtle regional wall motion abnormalities,   grossly normal wall motion. No evidence of LVOT obstruction or   systolic anterior motion of the mitral valve. No hemodynamically   significant valvular heart disease. IVC appears underfilled and   collapses easily.  Chest CT-A 06/13/18:  IMPRESSION: 1. No acute pulmonary embolus. 2. Patchy airspace opacities in the right lower lobe outlining what appears to be a thin walled pulmonary cyst measuring 3.2 x 2 x 0.7 cm. Findings raise concern for pneumonia possibly aspiration related. 3. Moderate-sized hiatal hernia  dilated air-filled esophagus containing frothy material within.  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 Kalka has several physical features concerning for Marfan syndrome.  She has no family history.  Her aorta is normal on CT 05/2018 and the aortic valve was normal on echo at that time.  There is no history of lens abnormalities.  She has a tall, thin frame, wrist/thumb sign, pectus carinatum, and scoliosis.  She had lung cysts but it is unclear if these were blebs or pneumothorax.  Modified Ghent criteria score 7-8.  She is undergoing work up with Dr. Sherryll Burger at [facility].  We will cancel the appointment with Dr. Jomarie Longs as it is Lynnae January to see two genetic specialists.  Will forward note and imaging reports to Dr. Sherryll Burger.   # Essential hypertension: BP persistently elevated both initially and on repeat.  We will start amlodipine  daily.  Track BP at home and bring to follow up.     Current medicines are reviewed at length with the patient today.  The patient does not have concerns regarding medicines.  The following changes have been made:  Start amlodipine  Labs/ tests ordered today include:  No orders of the defined types were placed in this encounter.    Disposition:   FU with Aria Jarrard C. Duke Salviaandolph, MD, [facility] in 1 year.  F/u with PharmD in 6 weeks.     Signed, Gloyd Happ C. Duke Salviaandolph, MD, [facility]  04/16/2019 11:58  AM    [facility]

## 2019-04-17 ENCOUNTER — Encounter: Payer: Self-pay | Admitting: Internal Medicine

## 2019-04-17 ENCOUNTER — Ambulatory Visit (INDEPENDENT_AMBULATORY_CARE_PROVIDER_SITE_OTHER): Payer: Managed Care, Other (non HMO) | Admitting: Internal Medicine

## 2019-04-17 DIAGNOSIS — F411 Generalized anxiety disorder: Secondary | ICD-10-CM

## 2019-04-17 DIAGNOSIS — E538 Deficiency of other specified B group vitamins: Secondary | ICD-10-CM | POA: Diagnosis not present

## 2019-04-17 DIAGNOSIS — B351 Tinea unguium: Secondary | ICD-10-CM

## 2019-04-17 DIAGNOSIS — I10 Essential (primary) hypertension: Secondary | ICD-10-CM | POA: Diagnosis not present

## 2019-04-17 DIAGNOSIS — G43709 Chronic migraine without aura, not intractable, without status migrainosus: Secondary | ICD-10-CM

## 2019-04-17 DIAGNOSIS — I671 Cerebral aneurysm, nonruptured: Secondary | ICD-10-CM | POA: Diagnosis not present

## 2019-04-17 DIAGNOSIS — N760 Acute vaginitis: Secondary | ICD-10-CM

## 2019-04-17 DIAGNOSIS — E559 Vitamin D deficiency, unspecified: Secondary | ICD-10-CM

## 2019-04-17 DIAGNOSIS — B9689 Other specified bacterial agents as the cause of diseases classified elsewhere: Secondary | ICD-10-CM

## 2019-04-17 MED ORDER — PROMETHAZINE HCL 12.5 MG PO TABS: 12.5000 mg | ORAL_TABLET | Freq: Three times a day (TID) | ORAL | 1 refills | Status: DC | PRN

## 2019-04-17 MED ORDER — CICLOPIROX 8 % EX SOLN: Freq: Every day | CUTANEOUS | 0 refills | Status: DC

## 2019-04-17 MED ORDER — POTASSIUM CHLORIDE CRYS ER 20 MEQ PO TBCR: 20.0000 meq | EXTENDED_RELEASE_TABLET | Freq: Two times a day (BID) | ORAL | 3 refills | Status: DC

## 2019-04-17 MED ORDER — METRONIDAZOLE 500 MG PO TABS: 500.0000 mg | ORAL_TABLET | Freq: Three times a day (TID) | ORAL | 1 refills | Status: DC

## 2019-04-17 MED ORDER — DIAZEPAM 5 MG PO TABS: 5.0000 mg | ORAL_TABLET | Freq: Two times a day (BID) | ORAL | 1 refills | Status: DC | PRN

## 2019-04-17 MED ORDER — AMLODIPINE BESYLATE 5 MG PO TABS: 5.0000 mg | ORAL_TABLET | Freq: Every day | ORAL | 3 refills | Status: DC

## 2019-04-17 MED ORDER — HYDROCHLOROTHIAZIDE 12.5 MG PO TABS: 12.5000 mg | ORAL_TABLET | Freq: Two times a day (BID) | ORAL | 3 refills | Status: DC

## 2019-04-17 MED ORDER — VITAMIN B-12 1000 MCG PO TABS: 1000.0000 ug | ORAL_TABLET | Freq: Every day | ORAL | 3 refills | Status: AC

## 2019-04-17 NOTE — Assessment & Plan Note (Signed)
On B12 

## 2019-04-17 NOTE — Assessment & Plan Note (Signed)
[facility] 

## 2019-04-17 NOTE — Assessment & Plan Note (Signed)
Vit D 

## 2019-04-17 NOTE — Assessment & Plan Note (Signed)
Flagyl prn

## 2019-04-17 NOTE — Assessment & Plan Note (Signed)
HCTZ, amlodipine

## 2019-04-17 NOTE — Assessment & Plan Note (Signed)
Dalya will be seeing Dr Leonie Man

## 2019-04-17 NOTE — Assessment & Plan Note (Signed)
Diazepam prn  Potential benefits of a long term benzodiazepines  use as well as potential risks  and complications were explained to the patient and were aknowledged. 

## 2019-04-17 NOTE — Assessment & Plan Note (Signed)
F/u w/Dr Deveshwar 

## 2019-04-17 NOTE — Progress Notes (Signed)
Virtual Visit via Video Note  I connected with [name] on 04/17/19 at  3:00 PM EDT by a video enabled telemedicine application and verified that I am speaking with the correct person using two identifiers.   I discussed the limitations of evaluation and management by telemedicine and the availability of in person appointments. The patient expressed understanding and agreed to proceed.  History of Present Illness: We need to follow-up on brain aneurism. C/o insomnia, anxiety. C/o chronic double vision. Seeing a geneticist, two ophthalmologists. F/u HTN. C/o nausea, PV, nail fungus  It is a very complex case  Per D/C summary:   "Recommendations for Outpatient Follow-up:  1. Patient needs outpatient follow-up with Dr. Clinton Sawyer 31-month consideration of aneurysm coiling/intervention 2. I have changed HTN meds to 12.5 HCTZ as well as Norvasc which was added and patient will need close follow-up with primary care physician regarding strict stringent control below 140 because of her aneurysms 3. Get Chem-12, CBC 1 week 4. Suggest outpatient referral to psychiatry given her anxiety and bipolar 5. Suggest either trazodone at night/Remeron and or Megace once followed up with PCP for weight gain  Discharge Diagnoses:  Principal Problem:   Carotid artery dissection (HCC) Active Problems:   Anxiety state   GERD   Essential hypertension   Hypokalemia   Carotid-cavernous fistula   Underweight   AKI (acute kidney injury) ([employer])   Discharge Condition: Improved  Diet recommendation: Liberalize to regular  There were no vitals filed for this visit.  History of present illness:  79 white female HTN PE 2005+ DVT hiatal hernia GERD Barrett's esophagus IDA, B12 deficiency gout bipolar Very well-known to neuro interventional-has bilateral ICA aneurysms and was referred by Dr. [name] to Dr. Robb Matar 3 part endovascular staged embolization 10/23/2018 with coiling and  stenting and 12/05/2018 She is also been followed by Dr. Kathlen Mody of ophthalmology in the past  [facility]23 transient visual symptoms but states that this was longer than her usual episodic events and lasted about 2 hours  It was recommended by neurology as well as neuro IR that patient be admitted as she has a marfanoid habitus Cardiology saw the patient and did a consult and felt that patient was stabilized for further outpatient work-up and they reviewed echoes as well as CT scans of the chest which did not show any impending issues Dr. Abram Sander saw the patient on 9/24 and felt that patient was okay to be discharged home he recommended high-dose aspirin 162 mg a day and I change the patient's HCTZ from 25-12.5 because of AKI on admission and added amlodipine as well Patient will need titration of blood pressure medications to keep systolic below 297-LG recommendation would be to use a beta-blocker in the outpatient setting which has less cardioselective T so it crosses blood-brain barrier and this can be discussed with her outpatient physician  She was stabilized from my perspective for discharge home"   There has been no runny nose, cough, chest pain, shortness of breath, abdominal pain, diarrhea, constipation, arthralgias, skin rashes. No CP, cough, SOB   Observations/Objective: The patient appears to be in no acute distress.  Assessment and Plan:  See my Assessment and Plan. Follow Up Instructions:    I discussed the assessment and treatment plan with the patient. The patient was provided an opportunity to ask questions and all were answered. The patient agreed with the plan and demonstrated an understanding of the instructions.   The patient  was advised to call back or seek an in-person evaluation if the symptoms worsen or if the condition fails to improve as anticipated.  I provided face-to-face time during this encounter. We were at different  locations. >45 min reviewing records   Sonda Primes, MD

## 2019-04-18 ENCOUNTER — Telehealth: Payer: Self-pay | Admitting: *Deleted

## 2019-04-18 NOTE — Telephone Encounter (Signed)
This encounter was created in error - please disregard.

## 2019-04-18 NOTE — Addendum Note (Signed)
Addended by: Alvina Filbert B on: 04/18/2019 05:36 PM   Modules accepted: Level of Service, SmartSet

## 2019-04-18 NOTE — Telephone Encounter (Signed)
  Received message from front desk patient needed note sent to her sisters place of employment for her appointment with Dr Oval Linsey on 04/16/19      Called patient to confirm needs to be office note or work excuse note Left message to call back

## 2019-04-18 NOTE — Telephone Encounter (Signed)
Received message from front desk patient needed note sent to her sisters place of employment for her appointment with Dr Oval Linsey on

## 2019-04-22 NOTE — Telephone Encounter (Signed)
Spoke with patient regarding visit. Faxed office note as requested to [phone] and [phone] with leave ID [id number] Incident # [id number] Confirmation received

## 2019-04-26 ENCOUNTER — Encounter: Payer: Self-pay | Admitting: Cardiovascular Disease

## 2019-05-02 ENCOUNTER — Encounter: Payer: 59 | Admitting: Genetic Counselor

## 2019-05-06 ENCOUNTER — Other Ambulatory Visit: Payer: Self-pay

## 2019-05-06 ENCOUNTER — Ambulatory Visit (INDEPENDENT_AMBULATORY_CARE_PROVIDER_SITE_OTHER): Payer: Managed Care, Other (non HMO) | Admitting: Neurology

## 2019-05-06 ENCOUNTER — Encounter: Payer: Self-pay | Admitting: Neurology

## 2019-05-06 VITALS — BP 133/86 | HR 95 | Temp 97.3°F | Ht 69.0 in | Wt 112.8 lb

## 2019-05-06 DIAGNOSIS — I671 Cerebral aneurysm, nonruptured: Secondary | ICD-10-CM

## 2019-05-06 DIAGNOSIS — I773 Arterial fibromuscular dysplasia: Secondary | ICD-10-CM

## 2019-05-06 NOTE — Patient Instructions (Signed)
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  She is awaiting results of genetic testing done at [facility] and has an upcoming appointment next month to see Dr. Milly Jakob endovascular neurosurgeon to discuss further treatment options.  I recommend she wear an eye patch for her diplopia during daytime hours as the prisms that she was prescribed have not been helpful.  She was advised to postpone eye correction surgery till definitive treatment for her is cavernous carotid fistula is determined.  She was advised to continue follow-up with Dr. [name] and no schedule follow-up appointment with me was made

## 2019-05-07 NOTE — Progress Notes (Signed)
[facility] [address] [place]. [place] [id number] [phone]       OFFICE CONSULT NOTE  Ms. [name] Date of Birth:  01/26/1971 Medical Record Number:  [id number]   Referring MD: Lesia SagoKeith Willis  Reason for Referral: Second opinion HPI: Ms. [name] is a 48 year old Caucasian lady seen today for second opinion consultation visit.  History is obtained from the patient, review of electronic medical records and I personally reviewed imaging films in PACS as well as records in care [facility].  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  She has had multiple episodes of bronchitis as well as history of hemoptysis and syncope in the past.  She was initially seen in the ER in December 2020 for hemoptysis and syncope at that time she had a brain MRI scan which showed mild changes of ventricular megaly but tortuosity of the both cervical ICAs with fusiform enlargement versus pseudoaneurysms raising concern for connective tissue disease.  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  Other findings appeared stable.  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 [name].  Postprocedure  she developed bilateral ptosis and redness of the right eye.  Repeat cerebral angiogram on 10/25/2018 showed persistent high-grade high flow with large left cavernous carotid fistula with egress into left superior ophthalmic inferior ophthalmic and superior petrosal inferior petrosal veins on the left.  Patient underwent subsequently 2 other endovascular embolization attempts without successful complete obliteration the last procedure being on 12/07/2018.  Patient has had persistent vertical diplopia which is binocular since then.  She still has persistent oozing sound in the left ear.  She denies any headache or any other focal symptoms.  She has been wearing eye patches.  She recently [facility] who contemplated eye surgery but wanted definitive treatment for cc fistula to be done first.  Patient was referred to endovascular neurosurgeon Dr. Judith BlonderHawk and has an appointment in early December to see him.  Most recent imaging study available is CT angiogram of the brain and neck on 03/13/2019 which showed extensive streak artifact from prior coil embolization of the CC fistula with a small new dissection fact flap involving proximal to mid left common carotid artery without significant flow restriction or narrowing.  There is a new 3 mm aneurysm or pseudoaneurysm proximal left cervical ICA as well as new 10 x 7 mm fusiform aneurysm involving left P2 segment and unchanged prior distal cervical ICA aneurysms and family made a right V2 aneurysm.  Patient has also been seen by cardiologist Dr. [facility] Salviaandolph for evaluation for Marfan's but has not been found to have aortic root dilatation other findings suggestive of Marfan's and echocardiogram on 06/14/2018 showed normal ejection fraction but no mention of aortic root is made on the report.  Prior chest x-rays as well as CT angiogram of the chest have not mention any dilatation of aortic root either.  The patient denies any family history of similar  vascular problems, brain aneurysms, brain hemorrhage strokes or TIAs.  She is currently not working due to medical disability.  She lives with her sister.  Patient denies any history of cataracts, vision acuity difficulties or cataracts, hyperextensible joints or hyperelastic skin.  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---   Depression    GERD (gastroesophageal reflux disease)    12/04/2018- not since surgery for Hiatal Hernia   Head injury with loss of consciousness ([place]) 05/2018   Hiatal hernia    History of blood transfusion 2007   History of DVT of lower extremity    08-14-2003  RIGHT LOWER EXTREMITY-- TREATED W/ COUMADIN----  per pt none since   History of palpitations    denies   Hypertension    Idiopathic chronic gout    12-20-2017  per pt stable ,   last episode 02/ 2019   Iron deficiency anemia    Migraines    Personal history of PE (pulmonary embolism)    03/ 2005 RIGHT SIDE  --- treated w/ coumadin---  per pt none since   Pneumonia 2019   PONV (postoperative nausea and vomiting)    Tinea versicolor    Vitamin D deficiency     Social History:  Social History   Socioeconomic History   Marital status: Single    Spouse name: Not on file   Number of children: Not on file   Years of education: Not on file   Highest education level: Not on file  Occupational History   Not on file  Social Needs   Financial resource strain: Not on file   Food insecurity    Worry: Not on file    Inability: Not on file   Transportation needs    Medical: Not on file    Non-medical: Not on file  Tobacco Use   Smoking  status: Never Smoker   Smokeless tobacco: Never Used  Substance and Sexual Activity   Alcohol use: Yes    Comment: rare, maybe 1 a year   Drug use: No   Sexual activity: Not on file    Comment: BTL  Lifestyle   Physical activity    Days per week: Not on file    Minutes per session: Not on file   Stress: Not on file  Relationships   Social connections    Talks on phone: Not on file    Gets together: Not on file    Attends religious service: Not on file    Active member of club or organization: Not on file    Attends meetings of clubs or organizations: Not on file    Relationship status: Not on file   Intimate partner violence    Fear of current or ex partner: Not on file    Emotionally abused: Not on file    Physically abused: Not on file    Forced sexual activity: Not  on file  Other Topics Concern   Not on file  Social History Narrative   She lives with her sister.    Medications:   Current Outpatient Medications on File Prior to Visit  Medication Sig Dispense Refill   allopurinol (ZYLOPRIM) 100 MG tablet Take 100 mg by mouth daily.     amLODipine (NORVASC) 5 MG tablet Take 1 tablet (5 mg total) by mouth daily. 90 tablet 3   aspirin 81 MG chewable tablet Chew 2 tablets (162 mg total) by mouth daily.     Cholecalciferol (VITAMIN D3) 2000 units TABS Take 2,000 Units by mouth daily.      ciclopirox (PENLAC) 8 % solution Apply topically at bedtime. Apply over nail and surrounding skin. Apply daily over previous coat. After seven (7) days, may remove with alcohol and continue cycle. 6.6 mL 0   diazepam (VALIUM) 5 MG tablet Take 1 tablet (5 mg total) by mouth every 12 (twelve) hours as needed for anxiety. 60 tablet 1   ergocalciferol (VITAMIN D2) 1.25 MG (50000 UT) capsule      ferrous sulfate 325 (65 FE) MG tablet Take 325 mg by mouth daily with breakfast.     hydrochlorothiazide (HYDRODIURIL) 12.5 MG tablet Take 1 tablet (12.5 mg total) by mouth 2 (two) times  daily. 180 tablet 3   Magnesium 400 MG TABS Take by mouth.     Magnesium Oxide 400 MG CAPS Take by mouth.     metroNIDAZOLE (FLAGYL) 500 MG tablet Take 1 tablet (500 mg total) by mouth 3 (three) times daily. 21 tablet 1   polyvinyl alcohol (LIQUIFILM TEARS) 1.4 % ophthalmic solution Place 1 drop into the left eye 4 (four) times daily. (Patient taking differently: Place 1 drop into the left eye 2 (two) times a day. ) 15 mL 0   potassium chloride SA (KLOR-CON M20) 20 MEQ tablet Take 1 tablet (20 mEq total) by mouth 2 (two) times daily. 180 tablet 3   promethazine (PHENERGAN) 12.5 MG tablet Take 1 tablet (12.5 mg total) by mouth every 8 (eight) hours as needed for nausea or vomiting. 30 tablet 1   Propylene Glycol (SYSTANE BALANCE) 0.6 % SOLN Place 1 drop into the left eye 2 (two) times daily.     timolol (TIMOPTIC) 0.5 % ophthalmic solution Place 1 drop into the left eye 2 (two) times daily. 10 mL 1   vitamin B-12 (CYANOCOBALAMIN) 1000 MCG tablet Take 1 tablet (1,000 mcg total) by mouth daily. 100 tablet 3   No current facility-administered medications on file prior to visit.     Allergies:   Allergies  Allergen Reactions   Guaifenesin Shortness Of Breath   Iron Anaphylaxis and Other (See Comments)    IV only   Doxepin Hives   Pseudoeph-Doxylamine-Dm-Apap Hives   Pseudoephedrine Hives   Sulfa Antibiotics Hives   Hydrocodone-Acetaminophen     UNSPECIFIED REACTION    Metoprolol Succinate Swelling    UNSPECIFIED EDEMA   Celexa [Citalopram Hydrobromide] Other (See Comments)    HEADACHE    Codeine Other (See Comments)    tiredness   Hydromorphone Hcl Nausea And Vomiting   Morphine Other (See Comments)    Does not work; hallucinations and irritable   Pantoprazole Sodium Other (See Comments)    heartburn   Prednisone Rash   Tetracyclines & Related Rash    Physical Exam General: Frail middle-aged Caucasian lady who looks cachectic.  She has long spidery fingers  but arm span does not appear to  be greater than her height.  She does not have any hyperelasticity of her skin.  She does not have any prominent cataracts in her eyes., seated, in no evident distress Head: head normocephalic and atraumatic.   Neck: supple with no carotid or supraclavicular bruits Cardiovascular: regular rate and rhythm, no murmurs Musculoskeletal: no deformity Skin:  no rash/petichiae Vascular:  Normal pulses all extremities  Neurologic Exam Mental Status: Awake and fully alert. Oriented to place and time. Recent and remote memory intact. Attention span, concentration and fund of knowledge appropriate. Mood and affect appropriate.  Cranial Nerves: Fundoscopic exam reveals sharp disc margins.  Right pupil is 3 mm reactive.  Left pupil is 5 mm sluggishly reactive.  Extraocular movements are full in the right eye.  Left eye shows absent upgaze and slight restriction of left lateral gaze.  Patient has binocular vertical diplopia in all directions but more prominent in upgaze.  Visual fields full to confrontation. Hearing intact. Facial sensation intact. Face, tongue, palate moves normally and symmetrically.  Motor: Normal bulk and tone. Normal strength in all tested extremity muscles. Sensory.: intact to touch , pinprick , position and vibratory sensation.  Coordination: Rapid alternating movements normal in all extremities. Finger-to-nose and heel-to-shin performed accurately bilaterally. Gait and Station: Arises from chair without difficulty. Stance is normal. Gait demonstrates normal stride length and balance . Able to heel, toe and tandem walk without difficulty.  Reflexes: 1+ and symmetric. Toes downgoing.   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  She likely has an underlying connective tissue disorder but physical profile does not suggest Marfan syndrome or Ehler Danlos.  She has undergone recent genetic testing and is awaiting results.     PLAN: I had a long discussion with the patient and her sister regarding her persistent binocular diplopia as a result of her cavernous carotid fistula which has been treated endovascularly x3 as well as recent diagnosis of small cervical carotid dissection as well as pseudoaneurysm and vertebral artery aneurysm all of which point likely to worsen underlying genetic collagen vascular disorder.  She is awaiting results of genetic testing done at [facility] and has an upcoming appointment next month to see Dr. Judith Blonder endovascular neurosurgeon to discuss further treatment options.  I recommend she wear an eye patch for her diplopia during daytime hours as the prisms that she was prescribed have not been helpful.  She was advised to postpone eye correction surgery till definitive treatment for her  cavernous carotid fistula is determined.  She was advised to continue follow-up with Dr. Anne Hahn and no schedule follow-up appointment with me was made.  Greater than 50% time during this prolonged 60-minute consultation visit was spent on extensive discussion about fibromuscular dysplasia, aneurysms, CC fistula and answering questions and extensive review of imaging studies. Delia Heady, MD  Note: This document was prepared with digital dictation and possible smart phrase technology. Any transcriptional errors that result from this process are unintentional.

## 2019-06-04 ENCOUNTER — Ambulatory Visit: Payer: Managed Care, Other (non HMO)

## 2019-06-19 ENCOUNTER — Encounter: Payer: Self-pay | Admitting: Internal Medicine

## 2019-06-19 ENCOUNTER — Ambulatory Visit (INDEPENDENT_AMBULATORY_CARE_PROVIDER_SITE_OTHER): Payer: Managed Care, Other (non HMO) | Admitting: Internal Medicine

## 2019-06-19 DIAGNOSIS — I671 Cerebral aneurysm, nonruptured: Secondary | ICD-10-CM

## 2019-06-19 DIAGNOSIS — E559 Vitamin D deficiency, unspecified: Secondary | ICD-10-CM

## 2019-06-19 DIAGNOSIS — Q796 Ehlers-Danlos syndrome, unspecified: Secondary | ICD-10-CM | POA: Insufficient documentation

## 2019-06-19 DIAGNOSIS — F411 Generalized anxiety disorder: Secondary | ICD-10-CM | POA: Diagnosis not present

## 2019-06-19 DIAGNOSIS — K22719 Barrett's esophagus with dysplasia, unspecified: Secondary | ICD-10-CM

## 2019-06-19 DIAGNOSIS — N390 Urinary tract infection, site not specified: Secondary | ICD-10-CM | POA: Insufficient documentation

## 2019-06-19 DIAGNOSIS — N3000 Acute cystitis without hematuria: Secondary | ICD-10-CM

## 2019-06-19 DIAGNOSIS — E538 Deficiency of other specified B group vitamins: Secondary | ICD-10-CM

## 2019-06-19 DIAGNOSIS — F330 Major depressive disorder, recurrent, mild: Secondary | ICD-10-CM | POA: Diagnosis not present

## 2019-06-19 MED ORDER — CIPROFLOXACIN HCL 250 MG PO TABS: 250.0000 mg | ORAL_TABLET | Freq: Two times a day (BID) | ORAL | 0 refills | Status: DC

## 2019-06-19 MED ORDER — VORTIOXETINE HBR 5 MG PO TABS: 5.0000 mg | ORAL_TABLET | Freq: Every day | ORAL | 5 refills | Status: DC

## 2019-06-19 MED ORDER — DIAZEPAM 5 MG PO TABS: 5.0000 mg | ORAL_TABLET | Freq: Two times a day (BID) | ORAL | 5 refills | Status: DC | PRN

## 2019-06-19 NOTE — Assessment & Plan Note (Signed)
Vit D 

## 2019-06-19 NOTE — Assessment & Plan Note (Signed)
Diazepam prn  Potential benefits of a long term benzodiazepines  use as well as potential risks  and complications were explained to the patient and were aknowledged. 

## 2019-06-19 NOTE — Assessment & Plan Note (Signed)
S/p carotid-cavernous fistula embolization s/p multiple prior treatments with embolization and stent placement  on 06/04/2019 underwent a left direct carotid-cavernous fistula embolization. The procedure was performed by Dr. Luther Hearing and assisted by Dr. Geralynn Ochs, Dr. Posey Pronto, and Dr. Buford Dresser. There were no intraoperative complications and the patient tolerated the procedure well. Post-angio she was transferred to the neurointensive care unit in stable condition where she was closely monitored with serial neuro exams and vital sign checks.  Postoperatively [name] did well. Her pain was well controlled with oral pain medications and her diet and activity were advanced as tolerated. She was able to ambulate safely and void spontaneously. She was instructed to continue taking her home aspirin 81 mg and discontinue the plavix.

## 2019-06-19 NOTE — Progress Notes (Signed)
Virtual Visit via Video Note  I connected with [name] on 06/19/19 at  3:40 PM EST by a video enabled telemedicine application and verified that I am speaking with the correct person using two identifiers.   I discussed the limitations of evaluation and management by telemedicine and the availability of in person appointments. The patient expressed understanding and agreed to proceed.  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 The procedure was performed by Dr. Luther Hearing and assisted by Dr. Geralynn Ochs, Dr. Posey Pronto, and Dr. Buford Dresser. There were no intraoperative complications and the patient tolerated the procedure well. Post-angio she was transferred to the neurointensive care unit in stable condition where she was closely monitored with serial neuro exams and vital sign checks.  Postoperatively [name] did well. Her pain was well controlled with oral pain medications and her diet and activity were advanced as tolerated. She was able to ambulate safely and void spontaneously. She was instructed to continue taking her home aspirin 81 mg and discontinue the plavix.   F/u Barrett's, vit D def, B12 def C/o depression, anxiety - worse  C/o UTI - new  There has been no runny nose, some cough, no abdominal pain, no diarrhea, no constipation, no skin rashes. There is dysuria, occ nausea, occ HAs   Observations/Objective: The patient appears to be in no acute distress, looks ok.  Assessment and Plan:  See my Assessment and Plan. A very complex case. [facility] records from the last 2 months reviewed  Follow Up Instructions:    I discussed the assessment and treatment plan with the patient. The patient was provided an opportunity to ask  questions and all were answered. The patient agreed with the plan and demonstrated an understanding of the instructions.   The patient was advised to call back or seek an in-person evaluation if the symptoms worsen or if the condition fails to improve as anticipated.  I provided face-to-face time during this encounter. We were at different locations.   Walker Kehr, MD

## 2019-06-19 NOTE — Assessment & Plan Note (Signed)
On B12 

## 2019-06-19 NOTE — Assessment & Plan Note (Signed)
Diazepam prn 

## 2019-06-19 NOTE — Assessment & Plan Note (Signed)
Protonix.  ?

## 2019-06-19 NOTE — Assessment & Plan Note (Signed)
Cipro works well -- will Rx

## 2019-06-19 NOTE — Assessment & Plan Note (Addendum)
Ehlers-Danlos syndrome w/vasc complications  S/p carotid-cavernous fistula embolization s/p multiple prior treatments with embolization and stent placement  on 06/04/2019 underwent a left direct carotid-cavernous fistula embolization. The procedure was performed by Dr. Luther Hearing and assisted by Dr. Geralynn Ochs, Dr. Posey Pronto, and Dr. Buford Dresser. There were no intraoperative complications and the patient tolerated the procedure well. Post-angio she was transferred to the neurointensive care unit in stable condition where she was closely monitored with serial neuro exams and vital sign checks.  Postoperatively [name] did well. Her pain was well controlled with oral pain medications and her diet and activity were advanced as tolerated. She was able to ambulate safely and void spontaneously. She was instructed to continue taking her home aspirin 81 mg and discontinue the plavix.

## 2019-06-19 NOTE — Assessment & Plan Note (Signed)
12/20 per [facility] - a new genetic dx of  Ehlers-Danlos syndrome with vascular complications

## 2019-06-24 ENCOUNTER — Telehealth: Payer: Self-pay

## 2019-06-24 NOTE — Telephone Encounter (Signed)
Pt has concerns about taking this medication after reading the pamphlet side effects and would like to know if you can send in anything else?

## 2019-06-24 NOTE — Telephone Encounter (Signed)
Copied from CRM [id number]. Topic: General - Other >> Jun 20, 2019  1:29 PM Dalphine Handing A wrote: Patient would like to know if an alternative to ciprofloxacin (CIPRO) 250 MG tablet can be called in for her . Patient would like to speak with nurse about this. Please advise

## 2019-06-24 NOTE — Telephone Encounter (Signed)
Patient called and would like to know if she can speak to a CMA about her medication Cipro and if it could be changed to something else since of the different warnings it has. Please call patient back.

## 2019-06-25 ENCOUNTER — Ambulatory Visit: Payer: 59 | Admitting: Neurology

## 2019-06-25 ENCOUNTER — Telehealth: Payer: Self-pay

## 2019-06-25 ENCOUNTER — Other Ambulatory Visit: Payer: Self-pay | Admitting: Internal Medicine

## 2019-06-25 MED ORDER — CEFUROXIME AXETIL 250 MG PO TABS: 250.0000 mg | ORAL_TABLET | Freq: Two times a day (BID) | ORAL | 0 refills | Status: DC

## 2019-06-25 NOTE — Telephone Encounter (Signed)
OK Ceftin  Thx 

## 2019-06-25 NOTE — Telephone Encounter (Signed)
LM notifying pt

## 2019-06-25 NOTE — Telephone Encounter (Signed)
Okay with me.  Please write a letter.  Thanks

## 2019-06-25 NOTE — Telephone Encounter (Signed)
Copied from CRM [id number]. Topic: General - Inquiry >> Jun 25, 2019  3:17 PM Baldo Daub L wrote: Reason for CRM:   Pt calling and states that she needs to speak with Kathie Rhodes.  States she needs a return to work note stating that she was seen on 06/19/2019 and that she can return to work on 07/01/2019.  Pt states that she needs to return to work part time (4 hours) for the next 2 weeks from 11a-3p then returning to work full time after that. Pt states that she needs the letter put in MyChart so she has a copy and it also needs to be faxed to her employer at [phone] and states that cover sheet needs to have incident number [id number] on it, Attn:  ADA department. And faxed to her boss at [phone], same incident number, Attn:  Alexis  Pt can be reached at [phone].

## 2019-06-25 NOTE — Telephone Encounter (Signed)
Pt called back and stated she is needs asap.  She stated she needs this faxed by tomorrow and stress how important it was to fax to all location and put in [place].  Please advise

## 2019-06-26 NOTE — Telephone Encounter (Signed)
done

## 2019-06-26 NOTE — Telephone Encounter (Signed)
Pt calling and states that she needs to speak with Kathie Rhodes.  States she needs a return to work note stating that she was seen on 06/19/2019 and that she can return to work on 07/01/2019.  Pt states that she needs to return to work part time (4 hours) for the next 2 weeks from 11a-3p then returning to work full time after that. Pt states that she needs the letter put in MyChart so she has a copy and it also needs to be faxed to her employer at [phone] and states that cover sheet needs to have incident number [id number] on it, Attn:  ADA department. And faxed to her boss at [phone], same incident number, Attn:  Alexis  Pt can be reached at [phone].    Patient called regarding messages please reach out

## 2019-06-26 NOTE — Telephone Encounter (Signed)
Pt called and asked if she can get a call from the nurse and needs the letter asap

## 2019-06-27 NOTE — Telephone Encounter (Signed)
Pt notified it was faxed 

## 2019-06-27 NOTE — Telephone Encounter (Signed)
Informed pt that she will have to call insurance to have them fax [place] the form

## 2019-06-27 NOTE — Telephone Encounter (Signed)
Pt calling.  States that Rosann Auerbach needs additional information regarding note.  States that because pt is only working 4 hours a day for 2 weeks.  States that there is a form that needs to be filled out but the insurance will not give her the form and states that they only give that to the PCP office. States that PCP needs to call [phone] and ask for an ADA form.

## 2019-06-27 NOTE — Telephone Encounter (Signed)
Patient states that she has spoken with Rosann Auerbach, they are faxing ADA form. Patient also requesting call back from [place]. Patient also wanted to note that the doctors note was not received by [place].   Rosann Auerbach fax# [phone] Incident number [id number] ATTN ADA

## 2019-06-28 NOTE — Telephone Encounter (Signed)
Pt's sister Bonita Quin called asking if Kathie Rhodes could please [place] back before any of the paperwork gets faxed back to her employer.   CB# [phone]

## 2019-07-01 NOTE — Telephone Encounter (Signed)
Pt called asking if her ADA form has been filled out and fax and sent back to her employer.  They are waiting on that to make a decision to let her work part time for two weeks then to come back to work full time.

## 2019-07-02 NOTE — Telephone Encounter (Signed)
Patient requesting call back from [place], as soon as possible.

## 2019-07-02 NOTE — Telephone Encounter (Signed)
Pt notified that we have not receive form and she will have them refax it.  Call pt when faxed

## 2019-07-03 ENCOUNTER — Telehealth: Payer: Self-pay | Admitting: Internal Medicine

## 2019-07-03 NOTE — Telephone Encounter (Signed)
See other TE.

## 2019-07-03 NOTE — Telephone Encounter (Signed)
Copied from CRM [id number]. Topic: General - Inquiry >> Jul 03, 2019 12:31 PM Tamela Oddi wrote: Patient is checking the status of a fax that was sent to office.  She stated that [employer]) has refaxed it to all fax numbers today.  Please notify patient when received.

## 2019-07-03 NOTE — Telephone Encounter (Signed)
Copied from CRM [id number]. Topic: General - Inquiry >> Jun 25, 2019  3:17 PM Baldo Daub L wrote: Reason for CRM:   Pt calling and states that she needs to speak with Kathie Rhodes.  States she needs a return to work note stating that she was seen on 06/19/2019 and that she can return to work on 07/01/2019.  Pt states that she needs to return to work part time (4 hours) for the next 2 weeks from 11a-3p then returning to work full time after that. Pt states that she needs the letter put in MyChart so she has a copy and it also needs to be faxed to her employer at [phone] and states that cover sheet needs to have incident number [id number] on it, Attn:  ADA department. And faxed to her boss at [phone], same incident number, Attn:  Alexis  Pt can be reached at [phone]. >> Jul 03, 2019 12:31 PM Tamela Oddi wrote: Patient is checking the status of a fax that was sent to office.  She stated that [employer] [facility]) has refaxed it to all fax numbers today.  Please notify patient when received.

## 2019-07-03 NOTE — Telephone Encounter (Signed)
LM notifying pt that fax was recieved

## 2019-07-04 ENCOUNTER — Other Ambulatory Visit: Payer: Self-pay | Admitting: Internal Medicine

## 2019-07-09 NOTE — Telephone Encounter (Signed)
Pt called and stated that [employer] advised her that they have not received the ADA forms back as of yet and need them back asap for Pt to work part time for 2 weeks before returning to work full time / please advise   Fax# [phone]

## 2019-07-10 NOTE — Telephone Encounter (Signed)
LM notifying pt that the paperwork [employer] faxed [place] was for her continuous leave to 06/24/19 which Neurology wrote her out for so they will have to be the ones to fill out the paperwork

## 2019-07-11 ENCOUNTER — Telehealth: Payer: Self-pay | Admitting: *Deleted

## 2019-07-11 NOTE — Telephone Encounter (Signed)
Forms have been signed, Faxed to [place], Copy sent to scan &Charged for.   Patient has been informed. She requesting forms to be faxed to her as well to [phone].

## 2019-07-11 NOTE — Telephone Encounter (Signed)
I received [employer] forms yesterday evening.   Patient has not yet return to work due to forms not being completed yet. After speaking with patient we have come up with... Requesting these forms to cover her from 12-30 to 1-22. With a return to work date on 1/25 full time with no restrictions.   Also would like intermitting leave 1-2 times a month for doctor appointments.   Dr.Plotnikov are you okay with this?

## 2019-07-11 NOTE — Telephone Encounter (Signed)
If patient call we have not received her form, pt need to call  her insurance company to have them to faxed to medical records [phone].

## 2019-08-06 ENCOUNTER — Ambulatory Visit: Payer: Managed Care, Other (non HMO) | Admitting: Neurology

## 2019-08-07 ENCOUNTER — Ambulatory Visit: Payer: Managed Care, Other (non HMO) | Admitting: Neurology

## 2019-08-13 ENCOUNTER — Other Ambulatory Visit: Payer: Self-pay | Admitting: Internal Medicine

## 2019-08-26 ENCOUNTER — Telehealth: Payer: Self-pay | Admitting: Neurology

## 2019-08-26 NOTE — Telephone Encounter (Signed)
Patient called in regards to a card she was advised by a friend she can get at the dr's office to carry for MRI's she doesn't have any additional information but wanted to discuss with RN

## 2019-08-26 NOTE — Telephone Encounter (Signed)
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 I advise pt to call the [facility] who did her last procedure if they are giving cards now when MRI is order. PT will call [facility] and verbalized understanding.

## 2019-09-13 ENCOUNTER — Telehealth: Payer: Self-pay

## 2019-09-13 NOTE — Telephone Encounter (Signed)
[facility] received a request for coverage or an exception to the coverage requirements of Trintellix 5MG  OR TABS for you. As long as you remain covered by your prescription drug plan and there are no changes to your plan benefits, this request is approved for the following time period: 09/13/2019 - 09/12/2021

## 2019-09-16 ENCOUNTER — Other Ambulatory Visit: Payer: Self-pay | Admitting: Internal Medicine

## 2019-11-12 ENCOUNTER — Telehealth: Payer: Self-pay | Admitting: Cardiovascular Disease

## 2019-11-12 NOTE — Telephone Encounter (Signed)
Called patient 11/12/19 to schedule appt, no answer left message 

## 2019-11-27 ENCOUNTER — Telehealth: Payer: Self-pay | Admitting: Cardiovascular Disease

## 2019-11-27 NOTE — Telephone Encounter (Signed)
I attempted to call patient on 11/27/19 to schedule follow up visit from patients recall list for Dr [facility] Salvia. The patient didn't answer so I left message for patient to return call to get that appointment scheduled.

## 2019-12-27 ENCOUNTER — Other Ambulatory Visit: Payer: Self-pay | Admitting: Internal Medicine

## 2020-01-03 ENCOUNTER — Telehealth: Payer: Self-pay | Admitting: Internal Medicine

## 2020-01-03 NOTE — Telephone Encounter (Signed)
Called pt and informed her that her jury duty excuse letter is printed and waiting up front for her to pick up

## 2020-01-03 NOTE — Telephone Encounter (Signed)
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 She states she would like to pick this up on Tuesday/Wednesday if she can. Please advise.  Juror# [id number]

## 2020-01-16 ENCOUNTER — Other Ambulatory Visit: Payer: Self-pay | Admitting: Internal Medicine

## 2020-01-17 NOTE — Telephone Encounter (Signed)
[place] Controlled Database Checked Last filled: 11/01/2019 (60) LOV w/you: 06/19/2019 Next appt w/you: none

## 2020-04-21 ENCOUNTER — Other Ambulatory Visit: Payer: Self-pay | Admitting: Internal Medicine

## 2020-05-09 ENCOUNTER — Other Ambulatory Visit: Payer: Self-pay | Admitting: Internal Medicine

## 2020-05-10 ENCOUNTER — Other Ambulatory Visit: Payer: Self-pay | Admitting: Internal Medicine

## 2020-06-03 ENCOUNTER — Other Ambulatory Visit: Payer: Self-pay | Admitting: Internal Medicine

## 2020-06-11 IMAGING — DX PORTABLE CHEST - 1 VIEW
1 series · 1 of 1 positions shown · non-contrast
Comparison: CT chest and chest x-ray dated July 11, 2018.

CLINICAL DATA: Hypercalcemia.

EXAM:
PORTABLE CHEST 1 VIEW

[chest ap]
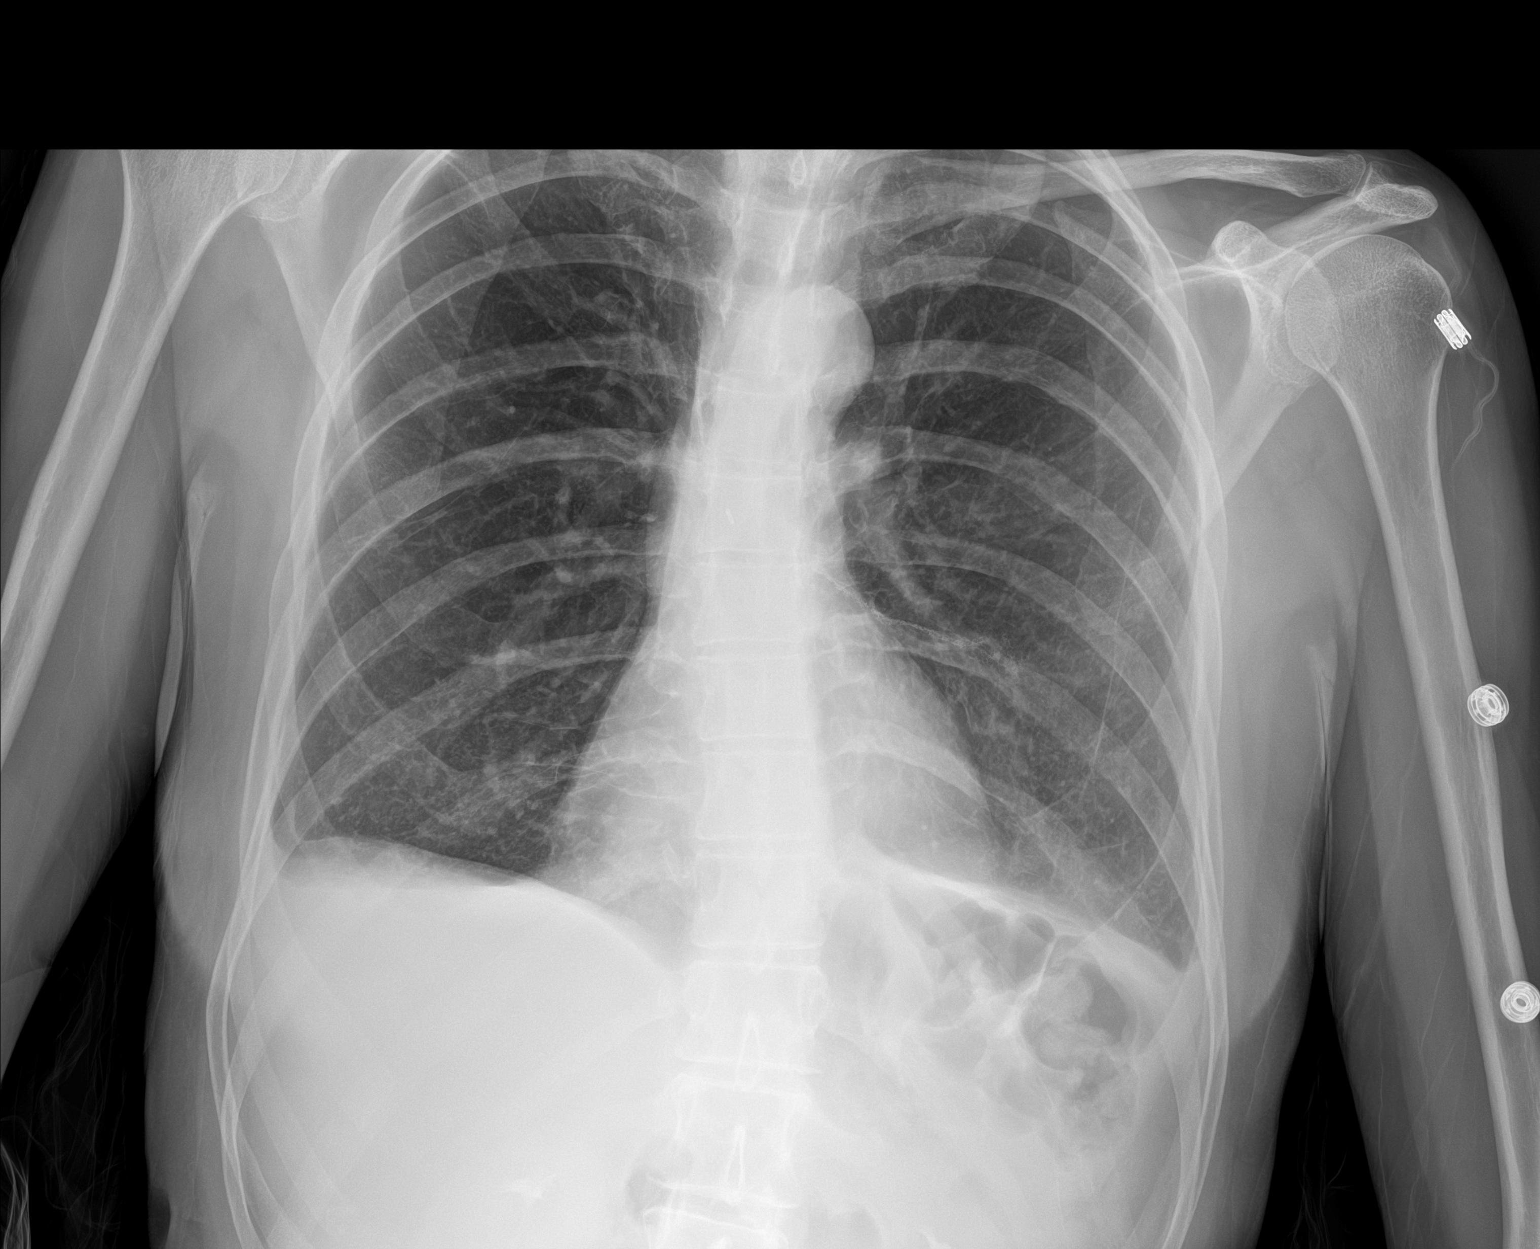

[1 of 1 positions shown; findings below may reference images not displayed]

FINDINGS: The heart size and mediastinal contours are within normal limits.
Normal pulmonary vascularity. Chronic pleural thickening at the
bilateral costophrenic angles is unchanged. No focal consolidation,
pleural effusion, or pneumothorax. No acute osseous abnormality.
IMPRESSION: No active disease.

## 2020-06-17 IMAGING — CT CT ANGIOGRAPHY HEAD
1 of 8 series · 6 of 33 positions shown · IV contrast (OMNI 350)
Comparison: Multiple previous studies including angiography
10/23/2018 and CT 10/20/2018

CLINICAL DATA: Right-sided paresthesia. Carotid cavernous fistula.
Therapy embolization 10/23/2018

EXAM:
CT ANGIOGRAPHY HEAD AND NECK
TECHNIQUE: Multidetector CT imaging of the head and neck was performed using
the standard protocol during bolus administration of intravenous
contrast. Multiplanar CT image reconstructions and MIPs were
obtained to evaluate the vascular anatomy. Carotid stenosis
measurements (when applicable) are obtained utilizing NASCET
criteria, using the distal internal carotid diameter as the
denominator.
CONTRAST:  75mL OMNIPAQUE IOHEXOL 350 MG/ML SOLN

[Series 7: cta neck axial · axial · 0.40mm/px · z∈[-270,-4]mm · 6 of 373 slices shown]
[im 54/373  soft-tissue]
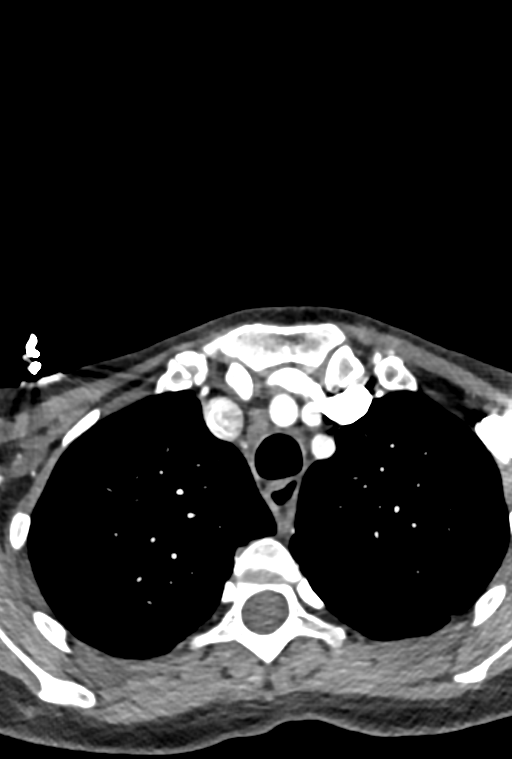
[im 107/373  bone]
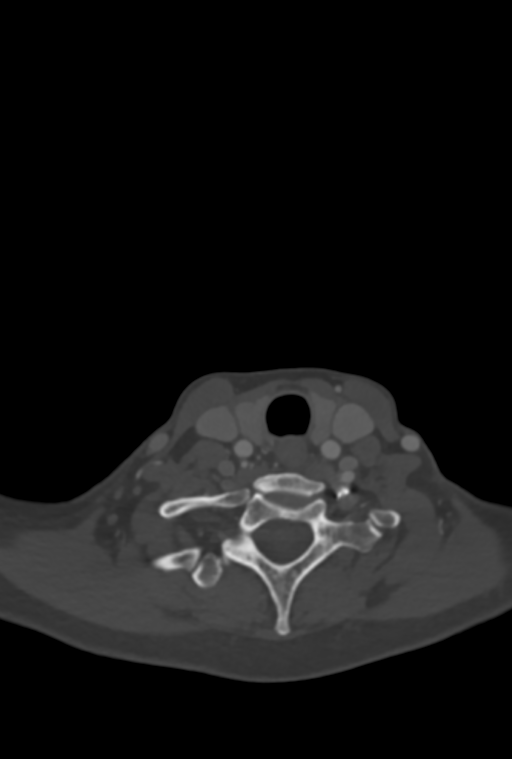
[im 160/373  soft-tissue]
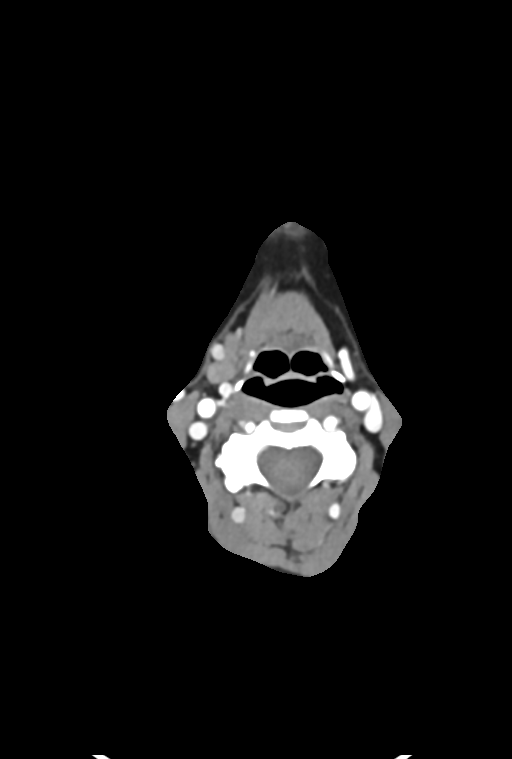
[im 213/373  bone]
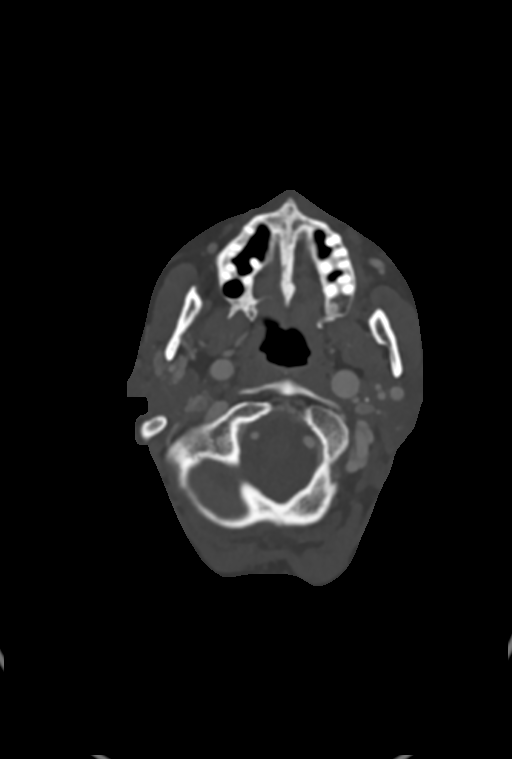
[im 266/373  soft-tissue]
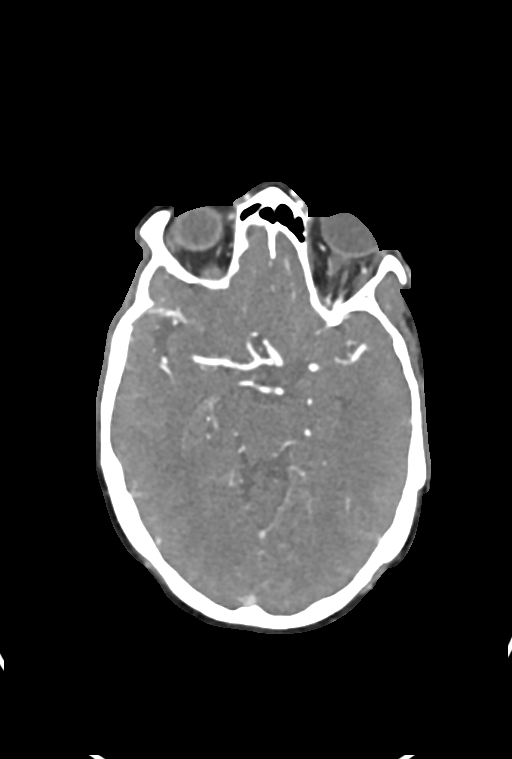
[im 319/373  bone]
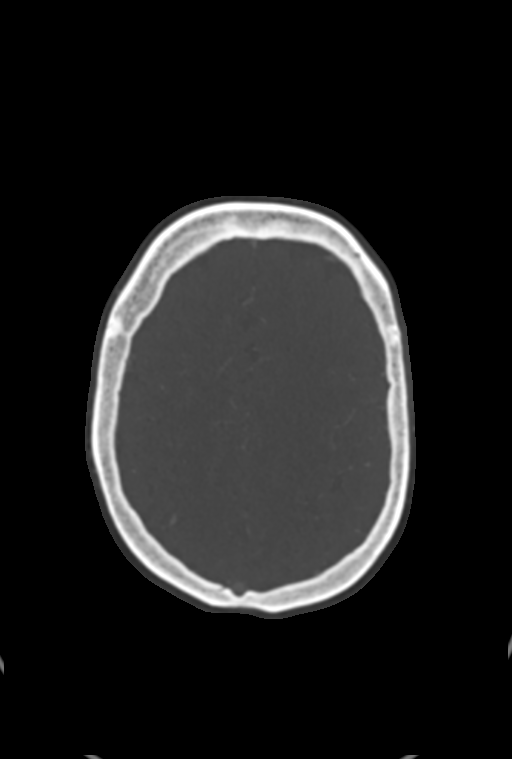

[6 of 33 positions shown; findings below may reference images not displayed]

FINDINGS: CT HEAD FINDINGS

Brain: There is streak artifact related to embolic material at the
skull base. Allowing for that, the brain has normal appearance
without evidence of atrophy, infarction, mass lesion, hemorrhage,
hydrocephalus or extra-axial collection.

Vascular: Embolic material at the skull base as noted above.

Skull: Negative

Sinuses: Clear except for some layering fluid in the sphenoid sinus.

Orbits: Normal

Review of the MIP images confirms the above findings

CTA NECK FINDINGS

Aortic arch: Normal except for minimal atherosclerotic calcification
at the left subclavian artery origin.

Right carotid system: No atherosclerotic disease at the bifurcation.
Redemonstration tortuosity and aneurysmal dilatation of the distal
right ICA maximal measurements 8 mm.

Left carotid system: No atherosclerotic disease at the bifurcation.
Redemonstration of tortuosity of the cervical ICA with aneurysmal
dilatation up to 12 mm.

Vertebral arteries: Normal

Skeleton: Mild cervical spondylosis.

Other neck: Normal

Upper chest: Upper lung emphysema.  No active process.

Review of the MIP images confirms the above findings

CTA HEAD FINDINGS

Anterior circulation: There is extensive artifact at the skull base
related to embolic material. There may also be a flow diverting
stent. I do not see evidence of distal vessel stenosis or occlusion.
Small vessel occlusion could be inapparent.

Posterior circulation: Both vertebral arteries are patent to the
basilar. No basilar pathology. Posterior circulation branch vessels
appear normal.

Venous sinuses: Continued prominence of the superior ophthalmic vein
on the left, raising the possibility of ongoing fistulous
communication. Other venous structures appear normal.

Anatomic variants: None significant.

Delayed phase: No abnormal enhancement.

Review of the MIP images confirms the above findings
IMPRESSION: No evidence of large or medium vessel occlusion.

Hyperdense material at the skull base primarily on the left
consistent with embolic material from treatment of cavernous carotid
fistula. Possible flow diverting stent in place. Streak artifact
limits precise evaluation in that region.

Continued prominence of the superior ophthalmic vein on the left and
draining veins in the skull base and upper neck region. This raises
the possibility that there could be a degree of ongoing cavernous
carotid fistulous communication.

Redemonstration of aneurysmal changes of the upper cervical internal
carotid arteries likely secondary to underlying fibromuscular
disease or other intimal pathology.

## 2020-06-17 IMAGING — CT CT HEAD WITHOUT CONTRAST
3 of 4 series · 14 of 47 positions shown, 16 images · IV contrast (omnipaque)
Comparison: Multiple previous studies including angiography
10/23/2018 and CT 10/20/2018

CLINICAL DATA: Right-sided paresthesia. Carotid cavernous fistula.
Therapy embolization 10/23/2018

EXAM:
CT ANGIOGRAPHY HEAD AND NECK
TECHNIQUE: Multidetector CT imaging of the head and neck was performed using
the standard protocol during bolus administration of intravenous
contrast. Multiplanar CT image reconstructions and MIPs were
obtained to evaluate the vascular anatomy. Carotid stenosis
measurements (when applicable) are obtained utilizing NASCET
criteria, using the distal internal carotid diameter as the
denominator.
CONTRAST:  75mL OMNIPAQUE IOHEXOL 350 MG/ML SOLN

[Series 3: head without · axial · non-contrast · 0.46mm/px · z∈[-103,+32]mm · 7 of 37 slices shown, 9 images]
[im 5/37  brain]
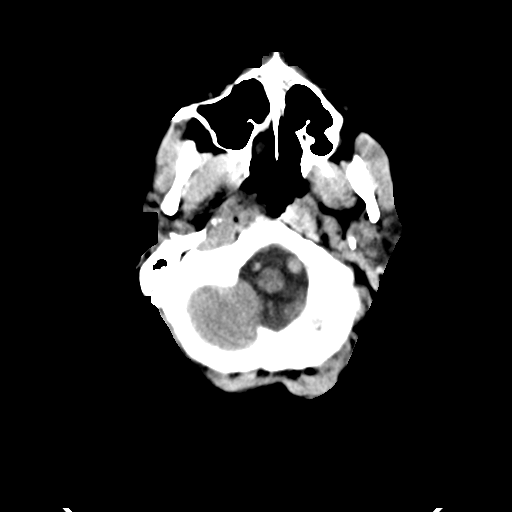
[im 5/37  bone]
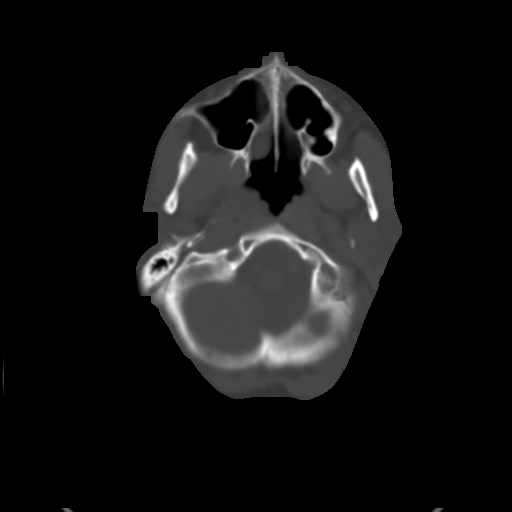
[im 10/37  brain]
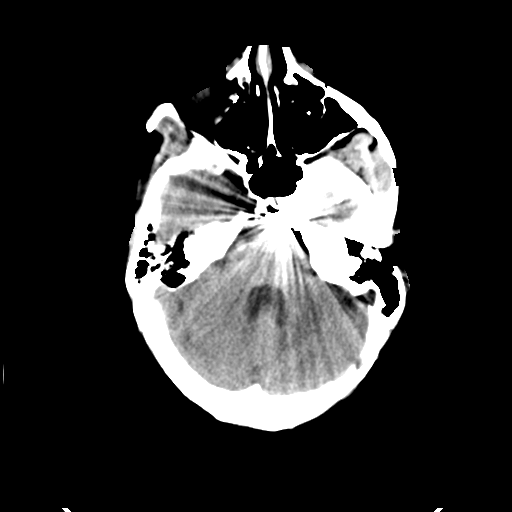
[im 14/37  brain]
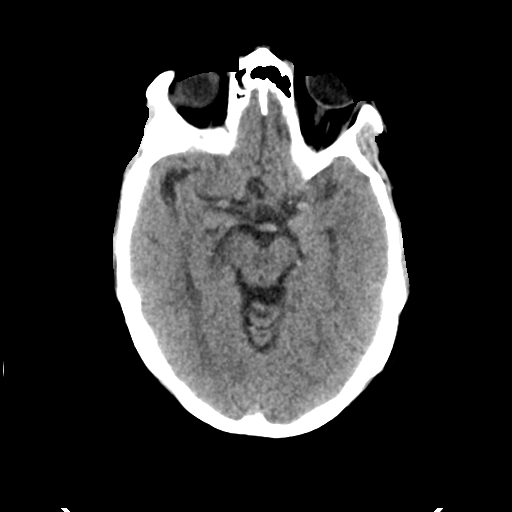
[im 19/37  brain]
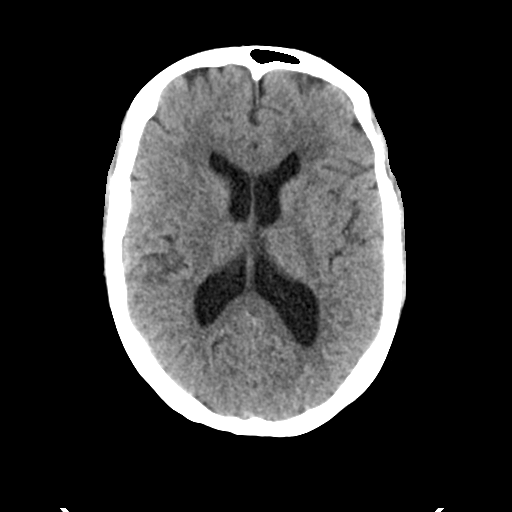
[im 23/37  brain]
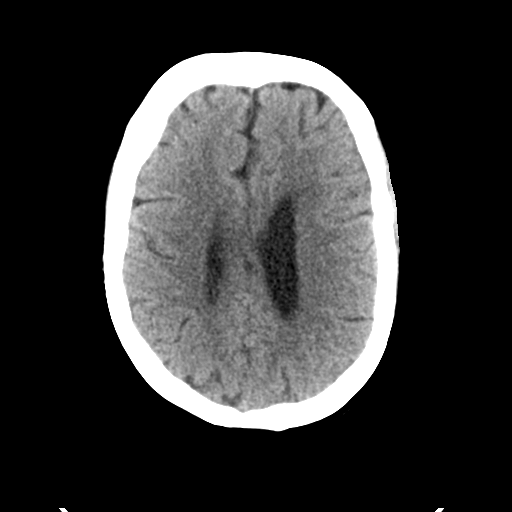
[im 23/37  bone]
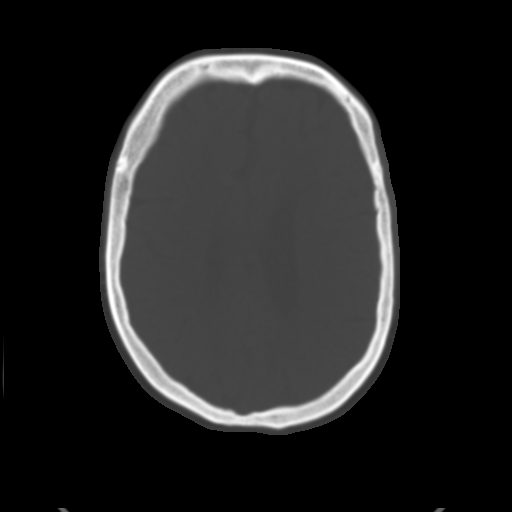
[im 28/37  brain]
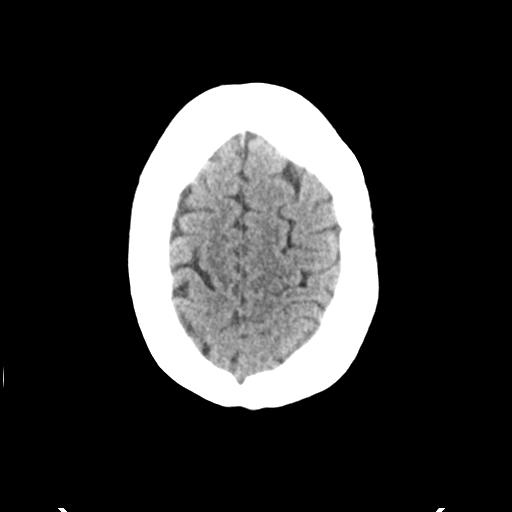
[im 32/37  brain]
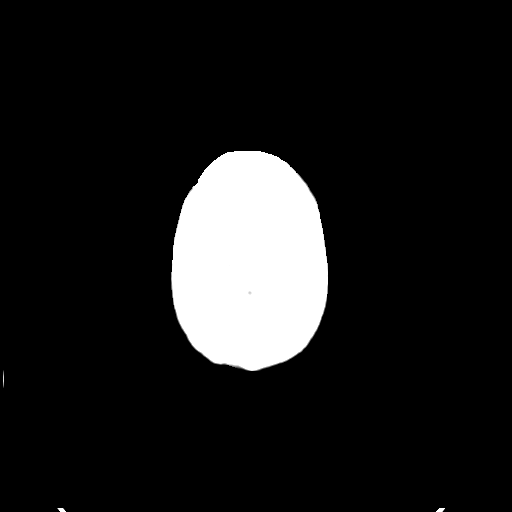

[Series 4: head bone · axial · 0.46mm/px · z∈[-105,-43]mm · 4 of 91 slices shown]
[im 10/91  bone]
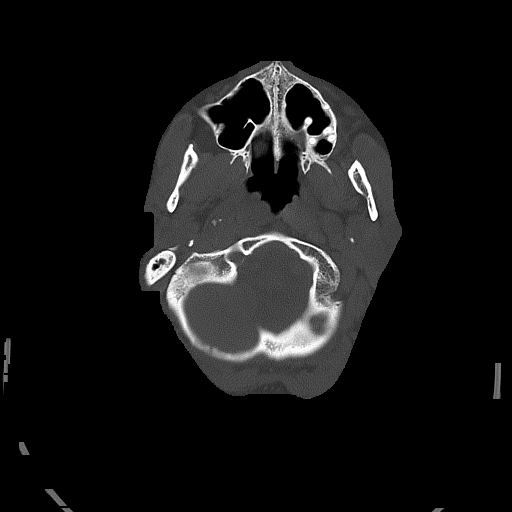
[im 19/91  bone]
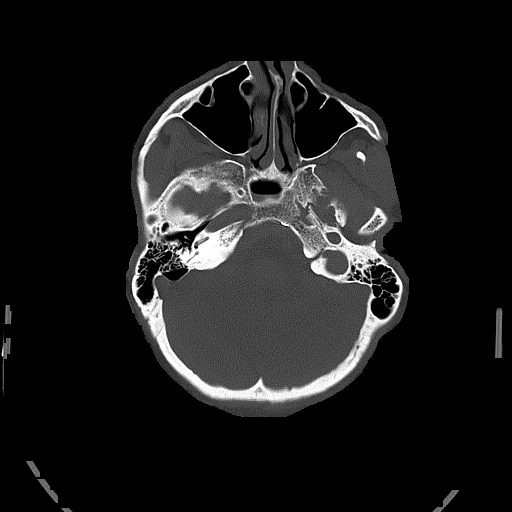
[im 28/91  bone]
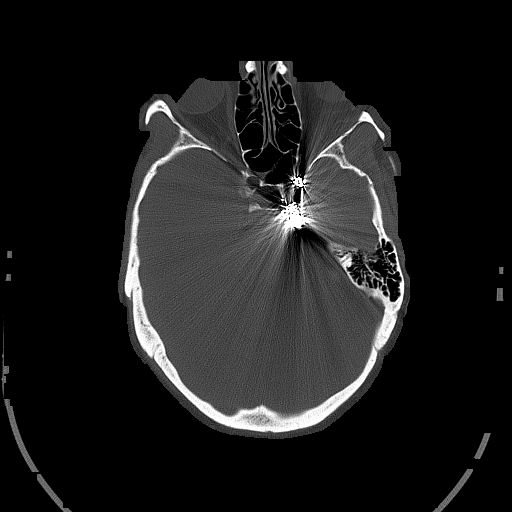
[im 41/91  bone]
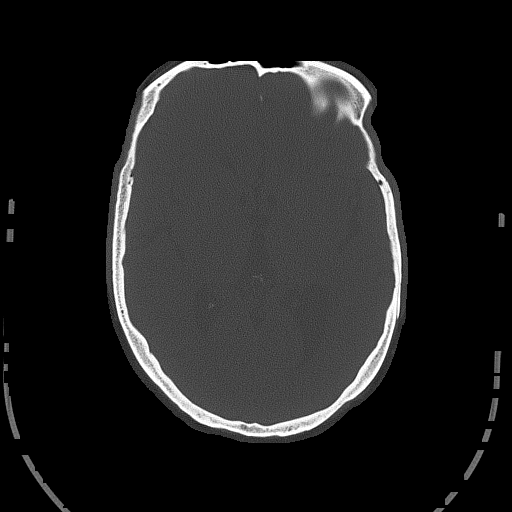

[Series 5: head without cor · coronal · non-contrast · 0.35mm/px · 3 of 72 slices shown]
[im 24/72  brain]
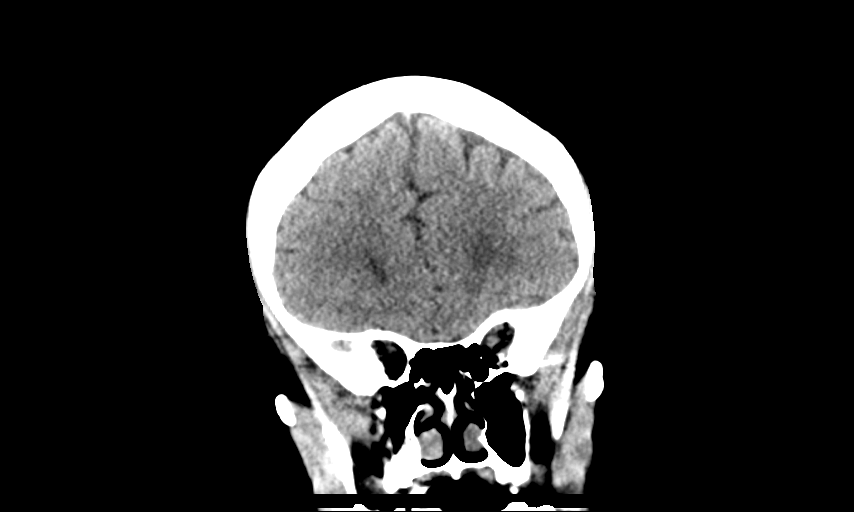
[im 32/72  brain]
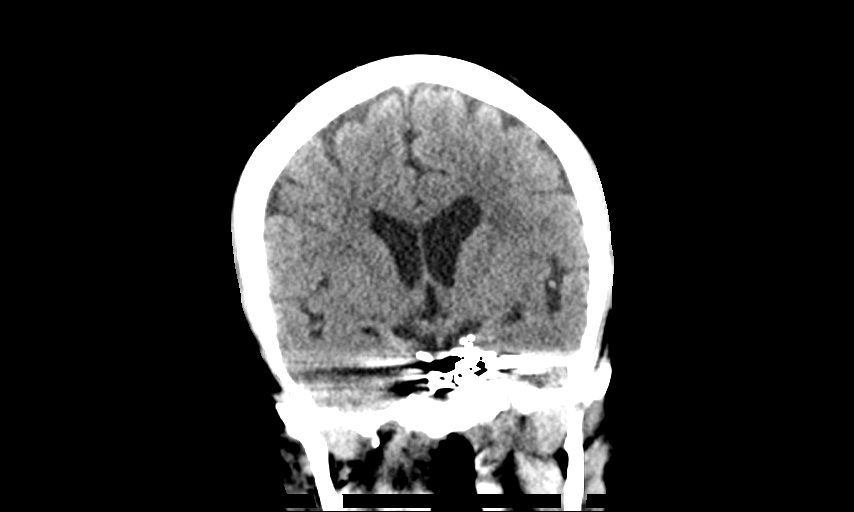
[im 40/72  brain]
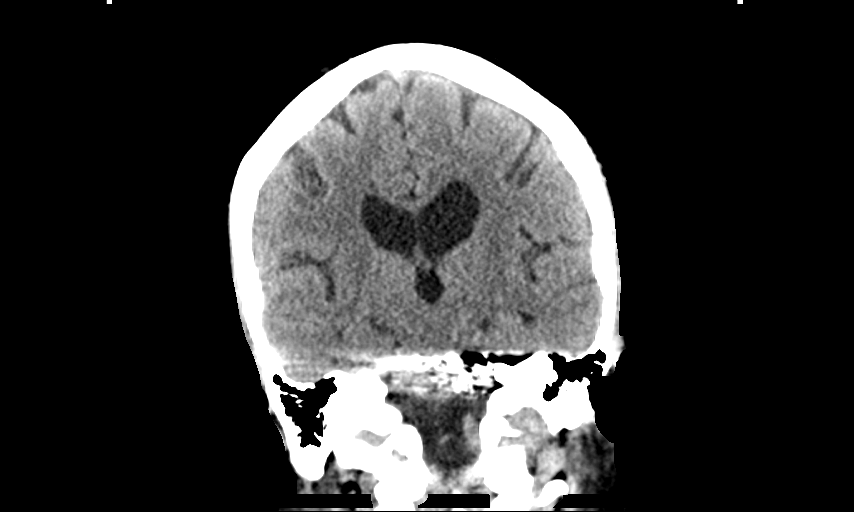

[14 of 47 positions shown; findings below may reference images not displayed]

FINDINGS: CT HEAD FINDINGS

Brain: There is streak artifact related to embolic material at the
skull base. Allowing for that, the brain has normal appearance
without evidence of atrophy, infarction, mass lesion, hemorrhage,
hydrocephalus or extra-axial collection.

Vascular: Embolic material at the skull base as noted above.

Skull: Negative

Sinuses: Clear except for some layering fluid in the sphenoid sinus.

Orbits: Normal

Review of the MIP images confirms the above findings

CTA NECK FINDINGS

Aortic arch: Normal except for minimal atherosclerotic calcification
at the left subclavian artery origin.

Right carotid system: No atherosclerotic disease at the bifurcation.
Redemonstration tortuosity and aneurysmal dilatation of the distal
right ICA maximal measurements 8 mm.

Left carotid system: No atherosclerotic disease at the bifurcation.
Redemonstration of tortuosity of the cervical ICA with aneurysmal
dilatation up to 12 mm.

Vertebral arteries: Normal

Skeleton: Mild cervical spondylosis.

Other neck: Normal

Upper chest: Upper lung emphysema.  No active process.

Review of the MIP images confirms the above findings

CTA HEAD FINDINGS

Anterior circulation: There is extensive artifact at the skull base
related to embolic material. There may also be a flow diverting
stent. I do not see evidence of distal vessel stenosis or occlusion.
Small vessel occlusion could be inapparent.

Posterior circulation: Both vertebral arteries are patent to the
basilar. No basilar pathology. Posterior circulation branch vessels
appear normal.

Venous sinuses: Continued prominence of the superior ophthalmic vein
on the left, raising the possibility of ongoing fistulous
communication. Other venous structures appear normal.

Anatomic variants: None significant.

Delayed phase: No abnormal enhancement.

Review of the MIP images confirms the above findings
IMPRESSION: No evidence of large or medium vessel occlusion.

Hyperdense material at the skull base primarily on the left
consistent with embolic material from treatment of cavernous carotid
fistula. Possible flow diverting stent in place. Streak artifact
limits precise evaluation in that region.

Continued prominence of the superior ophthalmic vein on the left and
draining veins in the skull base and upper neck region. This raises
the possibility that there could be a degree of ongoing cavernous
carotid fistulous communication.

Redemonstration of aneurysmal changes of the upper cervical internal
carotid arteries likely secondary to underlying fibromuscular
disease or other intimal pathology.

## 2020-07-02 ENCOUNTER — Encounter: Payer: Self-pay | Admitting: Internal Medicine

## 2020-07-02 ENCOUNTER — Ambulatory Visit (INDEPENDENT_AMBULATORY_CARE_PROVIDER_SITE_OTHER): Payer: 59 | Admitting: Internal Medicine

## 2020-07-02 ENCOUNTER — Other Ambulatory Visit: Payer: Self-pay

## 2020-07-02 VITALS — BP 132/84 | HR 89 | Temp 97.9°F | Wt 117.2 lb

## 2020-07-02 DIAGNOSIS — F411 Generalized anxiety disorder: Secondary | ICD-10-CM | POA: Diagnosis not present

## 2020-07-02 DIAGNOSIS — E538 Deficiency of other specified B group vitamins: Secondary | ICD-10-CM | POA: Diagnosis not present

## 2020-07-02 DIAGNOSIS — I7771 Dissection of carotid artery: Secondary | ICD-10-CM

## 2020-07-02 DIAGNOSIS — F330 Major depressive disorder, recurrent, mild: Secondary | ICD-10-CM

## 2020-07-02 DIAGNOSIS — Z23 Encounter for immunization: Secondary | ICD-10-CM | POA: Diagnosis not present

## 2020-07-02 DIAGNOSIS — E559 Vitamin D deficiency, unspecified: Secondary | ICD-10-CM | POA: Diagnosis not present

## 2020-07-02 DIAGNOSIS — E876 Hypokalemia: Secondary | ICD-10-CM

## 2020-07-02 MED ORDER — TRIAMTERENE-HCTZ 37.5-25 MG PO TABS: 1.0000 | ORAL_TABLET | Freq: Every day | ORAL | 11 refills | Status: DC

## 2020-07-02 MED ORDER — CICLOPIROX 8 % EX SOLN: CUTANEOUS | 1 refills | Status: AC

## 2020-07-02 MED ORDER — POTASSIUM CHLORIDE CRYS ER 20 MEQ PO TBCR: 20.0000 meq | EXTENDED_RELEASE_TABLET | Freq: Two times a day (BID) | ORAL | 3 refills | Status: DC

## 2020-07-02 MED ORDER — AMLODIPINE BESYLATE 5 MG PO TABS: 5.0000 mg | ORAL_TABLET | Freq: Every day | ORAL | 3 refills | Status: DC

## 2020-07-02 MED ORDER — VORTIOXETINE HBR 5 MG PO TABS: 5.0000 mg | ORAL_TABLET | Freq: Every day | ORAL | 5 refills | Status: DC

## 2020-07-02 MED ORDER — ALLOPURINOL 100 MG PO TABS: 100.0000 mg | ORAL_TABLET | Freq: Two times a day (BID) | ORAL | 3 refills | Status: DC

## 2020-07-02 MED ORDER — DIAZEPAM 5 MG PO TABS: 5.0000 mg | ORAL_TABLET | Freq: Two times a day (BID) | ORAL | 3 refills | Status: DC | PRN

## 2020-07-02 NOTE — Assessment & Plan Note (Signed)
Cont w/ KCl bid

## 2020-07-02 NOTE — Assessment & Plan Note (Signed)
Off Celexa

## 2020-07-02 NOTE — Assessment & Plan Note (Signed)
Needs a better BP control Try Maxzide 1/2 or 1 a day BMET

## 2020-07-02 NOTE — Assessment & Plan Note (Signed)
Re-start B12 

## 2020-07-02 NOTE — Assessment & Plan Note (Signed)
Re-start Vit D 

## 2020-07-02 NOTE — Assessment & Plan Note (Addendum)
Worse - stress at home Diazepam prn  Potential benefits of a long term benzodiazepines  use as well as potential risks  and complications were explained to the patient and were aknowledged.

## 2020-07-02 NOTE — Progress Notes (Signed)
Subjective:  Patient ID: [name], female    DOB: Jan 21, 1971  Age: 50 y.o. MRN: [id number]  CC: Medication Refill (Flu shot)   HPI [name] presents for anxiety/stress, HTN, hypokalemia f/u Not seeing a psychologist, psychiatrist now.  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 allopurinol (ZYLOPRIM) 100 MG tablet TAKE 1 TABLET BY MOUTH TWICE A DAY 180 tablet 1  . amLODipine (NORVASC) 5 MG tablet TAKE 1 TABLET BY MOUTH EVERY DAY 90 tablet 1  . aspirin 81 MG chewable tablet Chew 2 tablets (162 mg total) by mouth daily.    . Cholecalciferol (VITAMIN D3) 2000 units TABS Take 2,000 Units by mouth daily.     . ciclopirox (PENLAC) 8 % solution APPLY AT BEDTIME OVER NAIL&SURROUNDING SKIN OVER PREVIOUS COAT AFTER 7DAY, MAY REMOVE WITH ALCOHOL 6.6 mL 0  . clopidogrel (PLAVIX) 75 MG tablet Take 75 mg by mouth daily.    . [employer] SENNA PLUS 8.6-50 MG tablet TAKE 2 TABLETS BY MOUTH 2 (TWO) TIMES DAILY FOR 30 DAYS TAKE TO PREVENT CONSTIPATION    . diazepam (VALIUM) 5 MG tablet TAKE 1 TABLET (5 MG TOTAL) BY MOUTH 2 (TWO) TIMES DAILY AS NEEDED FOR ANXIETY. 60 tablet 0  . ferrous sulfate 325 (65 FE) MG tablet Take 325 mg by mouth daily with breakfast.    . hydrochlorothiazide (HYDRODIURIL) 12.5 MG tablet Take 1 tablet (12.5 mg total) by mouth 2 (two) times daily. Overdue for Annual appt w/labs must see provider for future refills 60 tablet 0  . KLOR-CON M20 20 MEQ tablet TAKE 1 TABLET BY MOUTH TWICE A DAY 180 tablet 1  . Magnesium 400 MG TABS Take by mouth.    . polyvinyl alcohol (LIQUIFILM TEARS) 1.4 % ophthalmic solution Place 1 drop into the left eye 4 (four) times daily. (Patient taking differently: Place 1 drop into the left eye 2 (two) times a day.) 15 mL 0  . potassium chloride SA (KLOR-CON) 20 MEQ tablet Take by mouth.    . promethazine (PHENERGAN) 12.5 MG tablet Take 1 tablet  (12.5 mg total) by mouth every 8 (eight) hours as needed for nausea or vomiting. 30 tablet 1  . Propylene Glycol (SYSTANE BALANCE) 0.6 % SOLN Place 1 drop into the left eye 2 (two) times daily.    . timolol (TIMOPTIC) 0.5 % ophthalmic solution Place 1 drop into the left eye 2 (two) times daily. 10 mL 1  . vortioxetine HBr (TRINTELLIX) 5 MG TABS tablet Take 1 tablet (5 mg total) by mouth daily. 30 tablet 5  . allopurinol (ZYLOPRIM) 100 MG tablet Take by mouth.    Marland Kitchen amLODipine (NORVASC) 5 MG tablet Take by mouth.    Marland Kitchen aspirin 81 MG chewable tablet Chew by mouth.    . cefUROXime (CEFTIN) 250 MG tablet Take 1 tablet (250 mg total) by mouth 2 (two) times daily. (Patient not taking: Reported on 07/02/2020) 10 tablet 0  . Cholecalciferol 50 MCG (2000 UT) TABS Take by mouth.    . ergocalciferol (VITAMIN D2) 1.25 MG (50000 UT) capsule  (Patient not taking: Reported on 07/02/2020)    . Magnesium Oxide 400 MG CAPS Take by mouth.    . metroNIDAZOLE (FLAGYL) 500 MG tablet Take 1 tablet (500 mg total) by mouth 3 (three) times daily. (Patient not taking: Reported on 07/02/2020) 21 tablet 1  No facility-administered medications prior to visit.    ROS: Review of Systems  Constitutional: Positive for fatigue. Negative for activity change, appetite change, chills and unexpected weight change.  HENT: Negative for congestion, mouth sores and sinus pressure.   Eyes: Negative for visual disturbance.  Respiratory: Negative for cough and chest tightness.   Gastrointestinal: Negative for abdominal pain and nausea.  Genitourinary: Negative for difficulty urinating, frequency and vaginal pain.  Musculoskeletal: Negative for back pain and gait problem.  Skin: Negative for pallor and rash.  Neurological: Positive for weakness. Negative for dizziness, tremors, numbness and headaches.  Psychiatric/Behavioral: Negative for confusion, sleep disturbance and suicidal ideas. The patient is nervous/anxious.     Objective:   BP 132/84 (BP Location: Left Arm)   Pulse 89   Temp 97.9 F (36.6 C) (Oral)   Wt 117 lb 3.2 oz (53.2 kg)   SpO2 97%   BMI 17.31 kg/m   BP Readings from Last 3 Encounters:  07/02/20 132/84  05/06/19 133/86  04/16/19 126/88    Wt Readings from Last 3 Encounters:  07/02/20 117 lb 3.2 oz (53.2 kg)  05/06/19 112 lb 12.8 oz (51.2 kg)  04/16/19 112 lb (50.8 kg)    Physical Exam Constitutional:      General: She is not in acute distress.    Appearance: She is well-developed.  HENT:     Head: Normocephalic.     Right Ear: External ear normal.     Left Ear: External ear normal.     Nose: Nose normal.     Mouth/Throat:     Mouth: Oropharynx is clear and moist.  Eyes:     General:        Right eye: No discharge.        Left eye: No discharge.     Conjunctiva/sclera: Conjunctivae normal.     Pupils: Pupils are equal, round, and reactive to light.  Neck:     Thyroid: No thyromegaly.     Vascular: No JVD.     Trachea: No tracheal deviation.  Cardiovascular:     Rate and Rhythm: Normal rate and regular rhythm.     Heart sounds: Normal heart sounds.  Pulmonary:     Effort: No respiratory distress.     Breath sounds: No stridor. No wheezing.  Abdominal:     General: Bowel sounds are normal. There is no distension.     Palpations: Abdomen is soft. There is no mass.     Tenderness: There is no abdominal tenderness. There is no guarding or rebound.  Musculoskeletal:        General: No tenderness or edema.     Cervical back: Normal range of motion and neck supple.  Lymphadenopathy:     Cervical: No cervical adenopathy.  Skin:    Findings: No erythema or rash.  Neurological:     Mental Status: She is oriented to person, place, and time.     Cranial Nerves: No cranial nerve deficit.     Motor: No abnormal muscle tone.     Coordination: Coordination normal.     Deep Tendon Reflexes: Reflexes normal.  Psychiatric:        Mood and Affect: Mood and affect normal.         Behavior: Behavior normal.        Thought Content: Thought content normal.        Judgment: Judgment normal.   Thin    A total time of >45 minutes was spent preparing  to see the patient, reviewing tests, x-rays, CTA, operative procedure reports and outside records from [facility].  Also, obtaining history and performing comprehensive physical exam.  Additionally, counseling the patient regarding the above listed issues.   Finally, documenting clinical information in the health records, coordination of care, educating the patient. It is a complex case.   Lab Results  Component Value Date   WBC 8.2 03/14/2019   HGB 12.7 03/14/2019   HCT 38.0 03/14/2019   PLT 259 03/14/2019   GLUCOSE 90 03/14/2019   CHOL 193 03/14/2019   TRIG 116 03/14/2019   HDL 28 (L) 03/14/2019   LDLDIRECT 173.9 04/09/2009   LDLCALC 142 (H) 03/14/2019   ALT 18 10/29/2018   AST 14 (L) 10/29/2018   NA 140 03/14/2019   K 3.6 03/14/2019   CL 111 03/14/2019   CREATININE 0.73 03/14/2019   BUN 12 03/14/2019   CO2 22 03/14/2019   TSH 3.430 03/13/2019   INR 1.0 12/05/2018   HGBA1C 5.4 03/14/2019    CT Angio Head W or Wo Contrast  Result Date: 03/13/2019 CLINICAL DATA:  Initial evaluation for worsening double vision, vomiting. History of CC fistula. EXAM: CT ANGIOGRAPHY HEAD AND NECK TECHNIQUE: Multidetector CT imaging of the head and neck was performed using the standard protocol during bolus administration of intravenous contrast. Multiplanar CT image reconstructions and MIPs were obtained to evaluate the vascular anatomy. Carotid stenosis measurements (when applicable) are obtained utilizing NASCET criteria, using the distal internal carotid diameter as the denominator. CONTRAST:  40mL OMNIPAQUE IOHEXOL 350 MG/ML SOLN COMPARISON:  Prior MRI/MRA from 02/07/2019 as well as previous CTA from 10/26/2018. FINDINGS: CT HEAD FINDINGS Brain: Extensive streak artifact from bilateral cavernous sinus coil embolization for known  CC fistula, limiting assessment at the skull base. No acute intracranial hemorrhage. No acute large vessel territory infarct. No mass lesion, mass effect, or midline shift. No hydrocephalus. No extra-axial fluid collection. Scattered nonspecific cerebral white matter changes grossly stable. Vascular: No hyperdense vessel. Skull: Scalp soft tissues and calvarium within normal limits. Sinuses: Mild layering opacity noted within the left sphenoid sinus. Paranasal sinuses are otherwise clear. No mastoid effusion. Orbits: Partially visualized globes and orbital soft tissues grossly unremarkable. CTA NECK FINDINGS Aortic arch: Visualized aortic arch normal in caliber with normal 3 vessel morphology. Mild atheromatous change at the origin of the left subclavian artery without significant stenosis. No hemodynamically significant stenosis about the origin of the great vessels. Visualized subclavian arteries widely patent. Right carotid system: Right CCA patent from its origin to the bifurcation without stenosis or other abnormality. Irregular somewhat linear filling defect about the right bifurcation favored to be artifactual, although a possible small dissection flap not entirely excluded (series 9, image 113). No significant atheromatous stenosis about the right bifurcation. Right ICA widely patent distally to the skull base. Distal cervical ICA dolichoectatic. Associated aneurysm relatively similar measuring approximately 10 x 6 mm. Left carotid system: Left common carotid artery widely patent proximally. There is a linear dissection flap extending from the proximal through the mid left CCA, seen on prior exam, although more prominent on today's study (series 9, image 119). No significant intimal irregularity or intraluminal thrombus. Left CCA remains widely patent to the bifurcation. No significant narrowing about the left bifurcation itself. 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 Vertebral arteries: Both vertebral arteries arise  from the subclavian arteries. Left vertebral artery dominant. There is a new fusiform aneurysm measuring 10 x 7 mm involving the left V2 segment at the level of C4-5 (series 8, image 230). 5 mm aneurysm involving the distal right V2 segment at the level of the C2 foramen is relatively stable (series 8, image 194). Vertebral arteries otherwise widely patent and unchanged. Skeleton: No acute osseous finding. No discrete lytic or blastic osseous lesions Other neck: No other acute soft tissue abnormality within the neck. Upper chest: Layering fluid noted within the upper esophageal lumen. Scattered emphysematous changes noted within the lungs. Visualized upper chest demonstrates no other acute finding. Review of the MIP images confirms the above findings CTA HEAD FINDINGS Anterior circulation: Visualized petrous segments patent. Extensive streak artifacts seen about the cavernous sinus related to previous coil embolization, limiting assessment. Atheromatous plaque at the para clinoid left ICA with mild to moderate stenosis is unchanged. ICA termini well perfused. A1 segments patent and stable. Normal anterior communicating artery complex. Anterior cerebral arteries remain well opacified and patent. M1 segments widely patent. Negative MCA bifurcations. Distal MCA branches well perfused. Extensive asymmetric prominence of the venous system within the right cerebral hemisphere is now seen, presumably secondary to prior coil embolization about the cavernous sinus. Right inferior petrosal sinus dilated and prominent. Multiple prominent venous collaterals also extend cephalad along the anterior falx. Posterior circulation: Vertebral arteries widely patent to the vertebrobasilar junction and are stable in appearance. Posterior inferior cerebral arteries patent  bilaterally visualized basilar patent proximally and distally without obvious stenosis, although assessment limited by streak artifact. Superior cerebral arteries patent bilaterally. Both PCAs remain patent and well opacified to their distal aspects. Venous sinuses: Major dural sinuses remain patent. Multiple new venous collaterals along the right inferior petrosal sinus and right cerebral hemisphere related to coil embolization of the CC fistula. Left superior orbital vein has been embolized. Anatomic variants: None significant. Review of the MIP images confirms the above findings IMPRESSION: CT HEAD IMPRESSION: Stable appearance of the brain. No acute intracranial abnormality identified. CTA HEAD AND NECK IMPRESSION: 1. Extensive streak artifact from prior coil embolization of cavernous carotid fistula, limiting assessment. Evaluation for ongoing cc fistulous connection limited on this exam. 2. New dissection flap involving the proximal-mid left common carotid artery without significant narrowing. 3. 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 4. 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 5. Unchanged distal cervical ICA aneurysms as compared to previous. 6. 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 7. Interval development of multiple prominent venous collaterals throughout the right cerebral hemisphere related to altered flow dynamics from previous coil embolization of CC fistula. Critical Value/emergent results were called by telephone at the time of interpretation on 03/13/2019 at 4:53 am to providerKRISTEN WARD , who verbally acknowledged these results. Electronically Signed   By: Rise MuBenjamin  McClintock M.D.   On: 03/13/2019 04:53   CT Angio Neck W and/or Wo Contrast  Result Date: 03/13/2019 CLINICAL DATA:  Initial evaluation for worsening double vision, vomiting. History of CC fistula. EXAM: CT ANGIOGRAPHY HEAD AND NECK TECHNIQUE:  Multidetector CT imaging of the head and neck was performed using the standard protocol during bolus administration of intravenous contrast. Multiplanar CT image reconstructions and MIPs were obtained to evaluate the vascular anatomy. Carotid stenosis measurements (when applicable) are obtained utilizing NASCET criteria, using the distal internal carotid diameter as the denominator. CONTRAST:  80mL OMNIPAQUE IOHEXOL 350  MG/ML SOLN COMPARISON:  Prior MRI/MRA from 02/07/2019 as well as previous CTA from 10/26/2018. 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 No acute intracranial hemorrhage. No acute large vessel territory infarct. No mass lesion, mass effect, or midline shift. No hydrocephalus. No extra-axial fluid collection. Scattered nonspecific cerebral white matter changes grossly stable. Vascular: No hyperdense vessel. Skull: Scalp soft tissues and calvarium within normal limits. Sinuses: Mild layering opacity noted within the left sphenoid sinus. Paranasal sinuses are otherwise clear. No mastoid effusion. Orbits: Partially visualized globes and orbital soft tissues grossly unremarkable. CTA NECK FINDINGS Aortic arch: Visualized aortic arch normal in caliber with normal 3 vessel morphology. Mild atheromatous change at the origin of the left subclavian artery without significant stenosis. No hemodynamically significant stenosis about the origin of the great vessels. Visualized subclavian arteries widely patent. Right carotid system: Right CCA patent from its origin to the bifurcation without stenosis or other abnormality. Irregular somewhat linear filling defect about the right bifurcation favored to be artifactual, although a possible small dissection flap not entirely excluded (series 9, image 113). No significant atheromatous stenosis about the right bifurcation. Right ICA widely  patent distally to the skull base. Distal cervical ICA dolichoectatic. Associated aneurysm relatively similar measuring approximately 10 x 6 mm. Left carotid system: Left common carotid artery widely patent proximally. There is a linear dissection flap extending from the proximal through the mid left CCA, seen on prior exam, although more prominent on today's study (series 9, image 119). No significant intimal irregularity or intraluminal thrombus. Left CCA remains widely patent to the bifurcation. No significant narrowing about the left bifurcation itself. 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 Vertebral arteries: Both vertebral arteries arise from the subclavian arteries. Left vertebral artery dominant. There is a new fusiform aneurysm measuring 10 x 7 mm involving the left V2 segment at the level of C4-5 (series 8, image 230). 5 mm aneurysm involving the distal right V2 segment at the level of the C2 foramen is relatively stable (series 8, image 194). Vertebral arteries otherwise widely patent and unchanged. Skeleton: No acute osseous finding. No discrete lytic or blastic osseous lesions Other neck: No other acute soft tissue abnormality within the neck. Upper chest: Layering fluid noted within the upper esophageal lumen. Scattered emphysematous changes noted within the lungs. Visualized upper chest demonstrates no other acute finding. Review of the MIP images confirms the above findings CTA HEAD FINDINGS Anterior circulation: Visualized petrous segments patent. Extensive streak artifacts seen about the cavernous sinus related to previous coil embolization, limiting assessment. Atheromatous plaque at the para clinoid left ICA with mild to moderate stenosis is unchanged. ICA termini well perfused. A1 segments patent and stable. Normal anterior communicating  artery complex. Anterior cerebral arteries remain well opacified and patent. M1 segments widely patent. Negative MCA bifurcations. Distal MCA branches well perfused. Extensive asymmetric prominence of the venous system within the right cerebral hemisphere is now seen, presumably secondary to prior coil embolization about the cavernous sinus. Right inferior petrosal sinus dilated and prominent. Multiple prominent venous collaterals also extend cephalad along the anterior falx. Posterior circulation: Vertebral arteries widely patent to the vertebrobasilar junction and are stable in appearance. Posterior inferior cerebral arteries patent bilaterally visualized basilar patent proximally and distally without obvious stenosis,  although assessment limited by streak artifact. Superior cerebral arteries patent bilaterally. Both PCAs remain patent and well opacified to their distal aspects. Venous sinuses: Major dural sinuses remain patent. Multiple new venous collaterals along the right inferior petrosal sinus and right cerebral hemisphere related to coil embolization of the CC fistula. Left superior orbital vein has been embolized. Anatomic variants: None significant. Review of the MIP images confirms the above findings IMPRESSION: CT HEAD IMPRESSION: Stable appearance of the brain. No acute intracranial abnormality identified. CTA HEAD AND NECK IMPRESSION: 1. Extensive streak artifact from prior coil embolization of cavernous carotid fistula, limiting assessment. Evaluation for ongoing cc fistulous connection limited on this exam. 2. New dissection flap involving the proximal-mid left common carotid artery without significant narrowing. 3. 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 4. 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 5. Unchanged distal cervical ICA aneurysms as compared to previous. 6. 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 7. Interval  development of multiple prominent venous collaterals throughout the right cerebral hemisphere related to altered flow dynamics from previous coil embolization of CC fistula. Critical Value/emergent results were called by telephone at the time of interpretation on 03/13/2019 at 4:53 am to providerKRISTEN WARD , who verbally acknowledged these results. Electronically Signed   By: Rise Mu M.D.   On: 03/13/2019 04:53   MR BRAIN WO CONTRAST  Result Date: 03/13/2019 CLINICAL DATA:  History of carotid cavernous fistula. Carotid artery dissection. Evaluate for infarct. EXAM: MRI HEAD WITHOUT CONTRAST TECHNIQUE: Multiplanar, multiecho pulse sequences of the brain and surrounding structures were obtained without intravenous contrast. COMPARISON:  02/07/2019 FINDINGS: Brain: No acute infarction, hemorrhage, hydrocephalus, extra-axial collection or mass lesion. Few remote white matter insults are stable and mainly periventricular. Normal brain volume Vascular: Abnormal number of asymmetric vessels along the surface of the right brain as recently evaluated by CTA. Bulbous cervical and cavernous ICA, reference prior CTA. Skull and upper cervical spine: Negative for marrow lesion Sinuses/Orbits: Negative IMPRESSION: Negative for acute infarct or other interval finding. Electronically Signed   By: Marnee Spring M.D.   On: 03/13/2019 07:33    Assessment & Plan:      Sonda Primes, MD

## 2020-07-06 ENCOUNTER — Other Ambulatory Visit: Payer: Self-pay | Admitting: Internal Medicine

## 2020-10-15 ENCOUNTER — Ambulatory Visit: Payer: 59 | Admitting: Internal Medicine

## 2020-11-11 ENCOUNTER — Encounter: Payer: Self-pay | Admitting: Internal Medicine

## 2020-11-11 ENCOUNTER — Telehealth (INDEPENDENT_AMBULATORY_CARE_PROVIDER_SITE_OTHER): Payer: 59 | Admitting: Internal Medicine

## 2020-11-11 DIAGNOSIS — F419 Anxiety disorder, unspecified: Secondary | ICD-10-CM | POA: Diagnosis not present

## 2020-11-11 DIAGNOSIS — M1A00X0 Idiopathic chronic gout, unspecified site, without tophus (tophi): Secondary | ICD-10-CM

## 2020-11-11 DIAGNOSIS — D649 Anemia, unspecified: Secondary | ICD-10-CM

## 2020-11-11 DIAGNOSIS — J209 Acute bronchitis, unspecified: Secondary | ICD-10-CM

## 2020-11-11 DIAGNOSIS — E538 Deficiency of other specified B group vitamins: Secondary | ICD-10-CM | POA: Diagnosis not present

## 2020-11-11 DIAGNOSIS — F330 Major depressive disorder, recurrent, mild: Secondary | ICD-10-CM

## 2020-11-11 DIAGNOSIS — E559 Vitamin D deficiency, unspecified: Secondary | ICD-10-CM | POA: Diagnosis not present

## 2020-11-11 MED ORDER — AMLODIPINE BESYLATE 5 MG PO TABS: 5.0000 mg | ORAL_TABLET | Freq: Every day | ORAL | 3 refills | Status: AC

## 2020-11-11 MED ORDER — PROMETHAZINE HCL 25 MG PO TABS: 25.0000 mg | ORAL_TABLET | Freq: Three times a day (TID) | ORAL | 1 refills | Status: AC | PRN

## 2020-11-11 MED ORDER — TRIAMTERENE-HCTZ 37.5-25 MG PO TABS: 1.0000 | ORAL_TABLET | Freq: Every day | ORAL | 11 refills | Status: DC

## 2020-11-11 MED ORDER — ALLOPURINOL 100 MG PO TABS: 100.0000 mg | ORAL_TABLET | Freq: Two times a day (BID) | ORAL | 3 refills | Status: AC

## 2020-11-11 MED ORDER — AZITHROMYCIN 250 MG PO TABS: ORAL_TABLET | ORAL | 0 refills | Status: DC

## 2020-11-11 MED ORDER — DIAZEPAM 5 MG PO TABS: 5.0000 mg | ORAL_TABLET | Freq: Two times a day (BID) | ORAL | 3 refills | Status: DC | PRN

## 2020-11-11 NOTE — Assessment & Plan Note (Signed)
New.  Will use a Z-Pak

## 2020-11-11 NOTE — Assessment & Plan Note (Signed)
On Vit D 

## 2020-11-11 NOTE — Assessment & Plan Note (Signed)
On Allopurinol 

## 2020-11-11 NOTE — Assessment & Plan Note (Signed)
Diazepam prn  Potential benefits of a long term benzodiazepines  use as well as potential risks  and complications were explained to the patient and were aknowledged. 

## 2020-11-11 NOTE — Progress Notes (Signed)
Virtual Visit via Video Note  I connected with Angelene D Lozoya on 11/11/20 at  7:50 AM EDT by a video enabled telemedicine application and verified that I am speaking with the correct person using two identifiers.   I discussed the limitations of evaluation and management by telemedicine and the availability of in person appointments. The patient expressed understanding and agreed to proceed.  I was located at our [facility] office. The patient was at home. There was no one else present in the visit.   History of Present Illness: C/o deep cough, bloody mucus since yesterday  There has been no shortness of breath, abdominal pain, diarrhea, constipation, arthralgias, skin rashes.   Follow-up on anxiety, hypertension, anemia  Observations/Objective: The patient appears to be in no acute distress, looks OK  Assessment and Plan:  See my Assessment and Plan. Follow Up Instructions:    I discussed the assessment and treatment plan with the patient. The patient was provided an opportunity to ask questions and all were answered. The patient agreed with the plan and demonstrated an understanding of the instructions.   The patient was advised to call back or seek an in-person evaluation if the symptoms worsen or if the condition fails to improve as anticipated.  I provided face-to-face time during this encounter. We were at different locations.   Sonda Primes, MD

## 2020-11-11 NOTE — Assessment & Plan Note (Signed)
On Vit B12 

## 2020-11-11 NOTE — Assessment & Plan Note (Signed)
On OTC iron

## 2020-11-11 NOTE — Assessment & Plan Note (Signed)
The patient stopped Trintellix due to headaches

## 2021-04-09 ENCOUNTER — Telehealth (INDEPENDENT_AMBULATORY_CARE_PROVIDER_SITE_OTHER): Payer: 59 | Admitting: Internal Medicine

## 2021-04-09 DIAGNOSIS — E559 Vitamin D deficiency, unspecified: Secondary | ICD-10-CM | POA: Diagnosis not present

## 2021-04-09 DIAGNOSIS — J4520 Mild intermittent asthma, uncomplicated: Secondary | ICD-10-CM | POA: Diagnosis not present

## 2021-04-09 DIAGNOSIS — J209 Acute bronchitis, unspecified: Secondary | ICD-10-CM

## 2021-04-09 DIAGNOSIS — I10 Essential (primary) hypertension: Secondary | ICD-10-CM | POA: Diagnosis not present

## 2021-04-09 MED ORDER — PROMETHAZINE-DM 6.25-15 MG/5ML PO SYRP: 5.0000 mL | ORAL_SOLUTION | Freq: Four times a day (QID) | ORAL | 1 refills | Status: DC | PRN

## 2021-04-09 MED ORDER — AZITHROMYCIN 250 MG PO TABS: ORAL_TABLET | ORAL | 0 refills | Status: AC

## 2021-04-09 NOTE — Progress Notes (Signed)
Patient ID: [name], female   DOB: 1971-06-04, 50 y.o.   MRN: [id number]  Virtual Visit via Video Note  I connected with [name] on 04/10/21 at  1:40 PM EDT by a video enabled telemedicine application and verified that I am speaking with the correct person using two identifiers.  Location of all participants today Patient: at home Provider:  at office   I discussed the limitations of evaluation and management by telemedicine and the availability of in person appointments. The patient expressed understanding and agreed to proceed.  History of Present Illness: Here with acute onset mild to mod 2-3 days ST, HA, general weakness and malaise, with prod cough greenish sputum, but Pt denies chest pain, increased sob or doe, wheezing, orthopnea, PND, increased LE swelling, palpitations, dizziness or syncope. Taking Vit D intermittent.   Pt denies polydipsia, polyuria, or new focal neuro s/s.   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---   Depression    GERD (gastroesophageal reflux disease)    12/04/2018- not since surgery for Hiatal Hernia   Head injury with loss of consciousness (HCC) 05/2018   Hiatal hernia    History of blood transfusion 2007   History of DVT of lower extremity    08-14-2003  RIGHT LOWER EXTREMITY-- TREATED W/ COUMADIN----  per pt none since   History of palpitations    denies   Hypertension    Idiopathic chronic gout    12-20-2017  per pt stable ,   last episode 02/ 2019   Iron deficiency anemia    Migraines    Personal history of PE (pulmonary embolism)    03/ 2005 RIGHT SIDE  --- treated w/ coumadin---  per pt none since   Pneumonia 2019   PONV  (postoperative nausea and vomiting)    Tinea versicolor    Vitamin D deficiency    Past Surgical History:  Procedure Laterality Date   ANEURYSM COILING     (10/23/2018 using coiling and stenting, 10/31/2018 using coiling, and 12/05/2018 using coils).   BIOPSY  11/29/2017   Procedure: BIOPSY;  Surgeon: [name], MD;  Location: [facility];  Service: Endoscopy;;   BRONCHOSCOPY  05-20-2004  dr clance   ESOPHAGOGASTRODUODENOSCOPY (EGD) WITH PROPOFOL N/A 11/29/2017   Procedure: ESOPHAGOGASTRODUODENOSCOPY (EGD) WITH PROPOFOL;  Surgeon: [name], MD;  Location: [facility];  Service: Endoscopy;  Laterality: N/A;   EXPLORATORY LAPAROTOMY  AGE 81    [facility]   for constipation   INSERTION OF MESH N/A 12/26/2017   Procedure: INSERTION OF BIO A MESH;  Surgeon: [name], MD;  Location: [facility];  Service: General;  Laterality: N/A;   IR ANGIO INTRA EXTRACRAN SEL COM CAROTID INNOMINATE BILAT MOD SED  10/20/2018   IR ANGIO INTRA EXTRACRAN SEL COM CAROTID INNOMINATE UNI R MOD SED  10/31/2018   IR ANGIO INTRA EXTRACRAN SEL INTERNAL CAROTID UNI L MOD SED  10/23/2018   IR ANGIO INTRA EXTRACRAN SEL INTERNAL CAROTID UNI L MOD SED  10/31/2018   IR ANGIO INTRA EXTRACRAN SEL INTERNAL CAROTID UNI L MOD SED  12/05/2018   IR ANGIO VERTEBRAL SEL SUBCLAVIAN  INNOMINATE UNI R MOD SED  10/20/2018   IR ANGIO VERTEBRAL SEL VERTEBRAL UNI L MOD SED  10/20/2018   IR ANGIOGRAM FOLLOW UP STUDY  10/23/2018   IR ANGIOGRAM FOLLOW UP STUDY  10/31/2018   IR ANGIOGRAM FOLLOW UP STUDY  12/05/2018   IR NEURO EACH ADD'L AFTER BASIC UNI LEFT (MS)  10/31/2018   IR TRANSCATH/EMBOLIZ  10/23/2018   IR TRANSCATH/EMBOLIZ  10/31/2018   IR TRANSCATH/EMBOLIZ  12/05/2018   IR VENO/JUGULAR RIGHT  12/05/2018   KNEE ARTHROSCOPY W/ ACL RECONSTRUCTION Right 09/2003   LAPAROSCOPY BILATERAL TUBAL FULGERATION AND ATTEMPTED THERMACHOICE ABLATION WITH UTERINE PERFORATION  07-17-2009   dr meisinger  [facility]   LUNG LOBECTOMY  1991   right lower lobe for massive hemoptyosis    MINI-THORACOTOMY WEDGE RESECTION OF LEFT LOWER LOBE CYST Left 06-24-2004   dr Edwyna [facility]   hemoptyosis   RADIOLOGY WITH ANESTHESIA N/A 10/23/2018   Procedure: IR WITH ANESTHESIA FOR EMBOLIZATION;  Surgeon: Julieanne Cotton, MD;  Location: [facility];  Service: Radiology;  Laterality: N/A;   RADIOLOGY WITH ANESTHESIA N/A 10/31/2018   Procedure: EMBOLIZATION;  Surgeon: Julieanne Cotton, MD;  Location: [facility];  Service: Radiology;  Laterality: N/A;   RADIOLOGY WITH ANESTHESIA N/A 12/05/2018   Procedure: EMBOLIZATION;  Surgeon: Julieanne Cotton, MD;  Location: [facility];  Service: Radiology;  Laterality: N/A;    reports that she has never smoked. She has never used smokeless tobacco. She reports current alcohol use. She reports that she does not use drugs. family history includes COPD in her father; Diabetes in an other family member; Diverticulitis in her mother; Hypertension in an other family member; Kidney cancer in her maternal grandmother. Allergies  Allergen Reactions   Guaifenesin Shortness Of Breath   Iron Anaphylaxis and Other (See Comments)    IV only   Doxepin Hives   Pseudoeph-Doxylamine-Dm-Apap Hives   Pseudoephedrine Hives   Sulfa Antibiotics Hives   Effexor Xr [Venlafaxine Hcl]     Wt gain   Hydrocodone-Acetaminophen     UNSPECIFIED REACTION    Lexapro [Escitalopram]     Made her eat - gained 30 lbs in 6 weeks   Metoprolol Succinate Swelling    UNSPECIFIED EDEMA   Prozac [Fluoxetine]     Wt loss   Trintellix [Vortioxetine]     Headaches   Celexa [Citalopram Hydrobromide] Other (See Comments)    HEADACHE    Codeine Other (See Comments)    tiredness   Hydromorphone Hcl Nausea And Vomiting   Morphine Other (See Comments)    Does not work; hallucinations and irritable   Pantoprazole Sodium Other (See Comments)    heartburn   Prednisone Rash   Tetracyclines & Related Rash   Current Outpatient Medications on File Prior to Visit  Medication Sig Dispense Refill    allopurinol (ZYLOPRIM) 100 MG tablet Take 1 tablet (100 mg total) by mouth 2 (two) times daily. 180 tablet 3   amLODipine (NORVASC) 5 MG tablet Take 1 tablet (5 mg total) by mouth daily. 90 tablet 3   aspirin 81 MG chewable tablet Chew 2 tablets (162 mg total) by mouth daily.     Cholecalciferol (VITAMIN D3) 2000 units TABS Take 2,000 Units by mouth daily.      ciclopirox (PENLAC) 8 % solution APPLY AT BEDTIME OVER NAIL&SURROUNDING SKIN OVER PREVIOUS COAT AFTER 7DAY, MAY REMOVE WITH ALCOHOL 6.6 mL 1   [facility] SENNA PLUS 8.6-50 MG tablet TAKE 2 TABLETS BY MOUTH 2 (TWO) TIMES DAILY FOR 30  DAYS TAKE TO PREVENT CONSTIPATION     diazepam (VALIUM) 5 MG tablet Take 1 tablet (5 mg total) by mouth 2 (two) times daily as needed for anxiety. 60 tablet 3   ferrous sulfate 325 (65 FE) MG tablet Take 325 mg by mouth daily with breakfast.     Magnesium 400 MG TABS Take by mouth.     polyvinyl alcohol (LIQUIFILM TEARS) 1.4 % ophthalmic solution Place 1 drop into the left eye 4 (four) times daily. (Patient taking differently: Place 1 drop into the left eye 2 (two) times a day.) 15 mL 0   potassium chloride SA (KLOR-CON M20) 20 MEQ tablet Take 1 tablet (20 mEq total) by mouth 2 (two) times daily. 180 tablet 3   promethazine (PHENERGAN) 25 MG tablet Take 1 tablet (25 mg total) by mouth every 8 (eight) hours as needed for nausea or vomiting. 30 tablet 1   Propylene Glycol (SYSTANE BALANCE) 0.6 % SOLN Place 1 drop into the left eye 2 (two) times daily.     timolol (TIMOPTIC) 0.5 % ophthalmic solution Place 1 drop into the left eye 2 (two) times daily. 10 mL 1   triamterene-hydrochlorothiazide (MAXZIDE-25) 37.5-25 MG tablet Take 1 tablet by mouth daily. 30 tablet 11   No current facility-administered medications on file prior to visit.   Observations/Objective: Alert, NAD, appropriate mood and affect, resps normal, cn 2-12 intact, moves all 4s, no visible rash or swelling Lab Results  Component Value Date   WBC 8.2  03/14/2019   HGB 12.7 03/14/2019   HCT 38.0 03/14/2019   PLT 259 03/14/2019   GLUCOSE 90 03/14/2019   CHOL 193 03/14/2019   TRIG 116 03/14/2019   HDL 28 (L) 03/14/2019   LDLDIRECT 173.9 04/09/2009   LDLCALC 142 (H) 03/14/2019   ALT 18 10/29/2018   AST 14 (L) 10/29/2018   NA 140 03/14/2019   K 3.6 03/14/2019   CL 111 03/14/2019   CREATININE 0.73 03/14/2019   BUN 12 03/14/2019   CO2 22 03/14/2019   TSH 3.430 03/13/2019   INR 1.0 12/05/2018   HGBA1C 5.4 03/14/2019   Assessment and Plan: See notes  Follow Up Instructions: See notes   I discussed the assessment and treatment plan with the patient. The patient was provided an opportunity to ask questions and all were answered. The patient agreed with the plan and demonstrated an understanding of the instructions.   The patient was advised to call back or seek an in-person evaluation if the symptoms worsen or if the condition fails to improve as anticipated.   Oliver Barre, MD

## 2021-04-10 ENCOUNTER — Encounter: Payer: Self-pay | Admitting: Internal Medicine

## 2021-04-10 NOTE — Assessment & Plan Note (Signed)
O/w stable, no change in tx needed,  to f/u any worsening symptoms or concerns

## 2021-04-10 NOTE — Patient Instructions (Signed)
Please take all new medication as prescribed 

## 2021-04-10 NOTE — Assessment & Plan Note (Signed)
Last vitamin D Lab Results  Component Value Date   VD25OH 21.05 (L) 09/15/2015   Low, reminded to start oral replacement

## 2021-04-10 NOTE — Assessment & Plan Note (Signed)
Mild to mod, for antibx course,  to f/u any worsening symptoms or concerns 

## 2021-04-10 NOTE — Assessment & Plan Note (Signed)
Pt encouraged to continue monitor BP at home and next visit

## 2021-05-13 ENCOUNTER — Other Ambulatory Visit: Payer: Self-pay | Admitting: Internal Medicine

## 2021-05-17 ENCOUNTER — Telehealth: Payer: Self-pay | Admitting: Internal Medicine

## 2021-05-17 NOTE — Telephone Encounter (Signed)
1.Medication Requested: diazepam (VALIUM) 5 MG tablet  2. Pharmacy (Name, Street, [place]): [id number] - [place], [place] - [address]  Phone:  [phone] Fax:  [phone]   3. On Med List: yes   4. Last Visit with PCP: 05.25.22  5. Next visit date with PCP: 01.04.23  **Patient wants to know if provider willing to send short supply until visit**   Agent: Please be advised that RX refills may take up to 3 business days. We ask that you follow-up with your pharmacy.

## 2021-05-18 MED ORDER — DIAZEPAM 5 MG PO TABS: 5.0000 mg | ORAL_TABLET | Freq: Two times a day (BID) | ORAL | 0 refills | Status: DC | PRN

## 2021-05-18 NOTE — Telephone Encounter (Signed)
Done erx 

## 2021-05-18 NOTE — Telephone Encounter (Signed)
Pt called back made appt for 06/23/21. Check [place] registry last filled 03/12/2021. Requesting refill until appt.Marland KitchenRaechel Chute

## 2021-06-23 ENCOUNTER — Ambulatory Visit: Payer: 59 | Admitting: Internal Medicine

## 2021-06-24 ENCOUNTER — Ambulatory Visit (INDEPENDENT_AMBULATORY_CARE_PROVIDER_SITE_OTHER): Payer: 59 | Admitting: Internal Medicine

## 2021-06-24 ENCOUNTER — Other Ambulatory Visit: Payer: Self-pay

## 2021-06-24 ENCOUNTER — Encounter: Payer: Self-pay | Admitting: Internal Medicine

## 2021-06-24 DIAGNOSIS — N951 Menopausal and female climacteric states: Secondary | ICD-10-CM

## 2021-06-24 DIAGNOSIS — Z23 Encounter for immunization: Secondary | ICD-10-CM | POA: Diagnosis not present

## 2021-06-24 DIAGNOSIS — E538 Deficiency of other specified B group vitamins: Secondary | ICD-10-CM

## 2021-06-24 DIAGNOSIS — E559 Vitamin D deficiency, unspecified: Secondary | ICD-10-CM

## 2021-06-24 DIAGNOSIS — F330 Major depressive disorder, recurrent, mild: Secondary | ICD-10-CM | POA: Diagnosis not present

## 2021-06-24 DIAGNOSIS — E8881 Metabolic syndrome: Secondary | ICD-10-CM

## 2021-06-24 LAB — COMPREHENSIVE METABOLIC PANEL
ALT: 43 U/L — ABNORMAL HIGH (ref 0–35)
AST: 50 U/L — ABNORMAL HIGH (ref 0–37)
Albumin: 4.6 g/dL (ref 3.5–5.2)
Alkaline Phosphatase: 75 U/L (ref 39–117)
BUN: 23 mg/dL (ref 6–23)
CO2: 29 mEq/L (ref 19–32)
Calcium: 10.1 mg/dL (ref 8.4–10.5)
Chloride: 101 mEq/L (ref 96–112)
Creatinine, Ser: 0.84 mg/dL (ref 0.40–1.20)
GFR: 80.66 mL/min (ref >60.00)
Glucose, Bld: 94 mg/dL (ref 70–99)
Potassium: 3.7 mEq/L (ref 3.5–5.1)
Sodium: 139 mEq/L (ref 135–145)
Total Bilirubin: 0.4 mg/dL (ref 0.2–1.2)
Total Protein: 7.6 g/dL (ref 6.0–8.3)

## 2021-06-24 LAB — LIPID PANEL
Cholesterol: 203 mg/dL — ABNORMAL HIGH (ref 0–200)
HDL: 38.8 mg/dL — ABNORMAL LOW (ref >39.00)
LDL Cholesterol: 124 mg/dL — ABNORMAL HIGH (ref 0–99)
NonHDL: 163.78
Total CHOL/HDL Ratio: 5
Triglycerides: 199 mg/dL — ABNORMAL HIGH (ref 0.0–149.0)
VLDL: 39.8 mg/dL (ref 0.0–40.0)

## 2021-06-24 LAB — TSH: TSH: 2.08 u[IU]/mL (ref 0.35–5.50)

## 2021-06-24 LAB — FOLLICLE STIMULATING HORMONE: FSH: 52.2 m[IU]/mL

## 2021-06-24 MED ORDER — POTASSIUM CHLORIDE CRYS ER 20 MEQ PO TBCR: 20.0000 meq | EXTENDED_RELEASE_TABLET | Freq: Two times a day (BID) | ORAL | 3 refills | Status: AC

## 2021-06-24 MED ORDER — MEGARED OMEGA-3 KRILL OIL 500 MG PO CAPS: 1.0000 | ORAL_CAPSULE | Freq: Every morning | ORAL | 3 refills | Status: AC

## 2021-06-24 MED ORDER — TRIAMTERENE-HCTZ 37.5-25 MG PO TABS: 1.0000 | ORAL_TABLET | Freq: Every day | ORAL | 3 refills | Status: AC

## 2021-06-24 MED ORDER — METRONIDAZOLE 500 MG PO TABS: 500.0000 mg | ORAL_TABLET | Freq: Three times a day (TID) | ORAL | 0 refills | Status: AC

## 2021-06-24 MED ORDER — DIAZEPAM 5 MG PO TABS: 5.0000 mg | ORAL_TABLET | Freq: Two times a day (BID) | ORAL | 2 refills | Status: AC | PRN

## 2021-06-24 NOTE — Assessment & Plan Note (Signed)
Check [facility] 

## 2021-06-24 NOTE — Progress Notes (Signed)
Subjective:  Patient ID: [name], female    DOB: Mar 04, 1971  Age: 51 y.o. MRN: [id number]  CC: Follow-up (Want to discuss labs & Med refills)   HPI [name] presents for well exam at work that needed attention: elevated TG 196, HDL 43  F/u HTN C/o night sweats, mood swings. LMP 05/30/21  Outpatient Medications Prior to Visit  Medication Sig Dispense Refill   allopurinol (ZYLOPRIM) 100 MG tablet Take 1 tablet (100 mg total) by mouth 2 (two) times daily. 180 tablet 3   amLODipine (NORVASC) 5 MG tablet Take 1 tablet (5 mg total) by mouth daily. 90 tablet 3   aspirin 81 MG chewable tablet Chew 2 tablets (162 mg total) by mouth daily.     azithromycin (ZITHROMAX Z-PAK) 250 MG tablet As directed 6 tablet 0   Cholecalciferol (VITAMIN D3) 2000 units TABS Take 2,000 Units by mouth daily.      ciclopirox (PENLAC) 8 % solution APPLY AT BEDTIME OVER NAIL&SURROUNDING SKIN OVER PREVIOUS COAT AFTER 7DAY, MAY REMOVE WITH ALCOHOL 6.6 mL 1   [facility] SENNA PLUS 8.6-50 MG tablet TAKE 2 TABLETS BY MOUTH 2 (TWO) TIMES DAILY FOR 30 DAYS TAKE TO PREVENT CONSTIPATION     ferrous sulfate 325 (65 FE) MG tablet Take 325 mg by mouth daily with breakfast.     Magnesium 400 MG TABS Take by mouth.     polyvinyl alcohol (LIQUIFILM TEARS) 1.4 % ophthalmic solution Place 1 drop into the left eye 4 (four) times daily. (Patient taking differently: Place 1 drop into the left eye 2 (two) times a day.) 15 mL 0   promethazine (PHENERGAN) 25 MG tablet Take 1 tablet (25 mg total) by mouth every 8 (eight) hours as needed for nausea or vomiting. 30 tablet 1   Propylene Glycol (SYSTANE BALANCE) 0.6 % SOLN Place 1 drop into the left eye 2 (two) times daily.     timolol (TIMOPTIC) 0.5 % ophthalmic solution Place 1 drop into the left eye 2 (two) times daily. 10 mL 1   diazepam (VALIUM) 5 MG tablet Take 1 tablet (5 mg total) by mouth 2 (two) times daily as needed for anxiety. 60 tablet 0   potassium chloride SA (KLOR-CON  M20) 20 MEQ tablet Take 1 tablet (20 mEq total) by mouth 2 (two) times daily. 180 tablet 3   triamterene-hydrochlorothiazide (MAXZIDE-25) 37.5-25 MG tablet Take 1 tablet by mouth daily. 30 tablet 11   promethazine-dextromethorphan (PROMETHAZINE-DM) 6.25-15 MG/5ML syrup Take 5 mLs by mouth 4 (four) times daily as needed for cough. (Patient not taking: Reported on 06/24/2021) 118 mL 1   No facility-administered medications prior to visit.    ROS: Review of Systems  Constitutional:  Negative for activity change, appetite change, chills, fatigue and unexpected weight change.  HENT:  Negative for congestion, mouth sores and sinus pressure.   Eyes:  Negative for visual disturbance.  Respiratory:  Negative for cough and chest tightness.   Gastrointestinal:  Negative for abdominal pain and nausea.  Genitourinary:  Negative for difficulty urinating, frequency and vaginal pain.  Musculoskeletal:  Negative for back pain and gait problem.  Skin:  Negative for pallor and rash.  Neurological:  Negative for dizziness, tremors, weakness, numbness and headaches.  Psychiatric/Behavioral:  Negative for confusion, sleep disturbance and suicidal ideas.    Objective:  BP 120/86 (BP Location: Left Arm)    Pulse 86    Temp 98.1 F (36.7 C) (Oral)    Ht 5' 7.5" (1.715  m)    Wt 119 lb 3.2 oz (54.1 kg)    SpO2 97%    BMI 18.39 kg/m   BP Readings from Last 3 Encounters:  06/24/21 120/86  07/02/20 132/84  05/06/19 133/86    Wt Readings from Last 3 Encounters:  06/24/21 119 lb 3.2 oz (54.1 kg)  07/02/20 117 lb 3.2 oz (53.2 kg)  05/06/19 112 lb 12.8 oz (51.2 kg)    Physical Exam Constitutional:      General: She is not in acute distress.    Appearance: She is well-developed.  HENT:     Head: Normocephalic.     Right Ear: External ear normal.     Left Ear: External ear normal.     Nose: Nose normal.  Eyes:     General:        Right eye: No discharge.        Left eye: No discharge.      Conjunctiva/sclera: Conjunctivae normal.     Pupils: Pupils are equal, round, and reactive to light.  Neck:     Thyroid: No thyromegaly.     Vascular: No JVD.     Trachea: No tracheal deviation.  Cardiovascular:     Rate and Rhythm: Normal rate and regular rhythm.     Heart sounds: Normal heart sounds.  Pulmonary:     Effort: No respiratory distress.     Breath sounds: No stridor. No wheezing.  Abdominal:     General: Bowel sounds are normal. There is no distension.     Palpations: Abdomen is soft. There is no mass.     Tenderness: There is no abdominal tenderness. There is no guarding or rebound.  Musculoskeletal:        General: No tenderness.     Cervical back: Normal range of motion and neck supple. No rigidity.  Lymphadenopathy:     Cervical: No cervical adenopathy.  Skin:    Findings: No erythema or rash.  Neurological:     Cranial Nerves: No cranial nerve deficit.     Motor: No abnormal muscle tone.     Coordination: Coordination normal.     Deep Tendon Reflexes: Reflexes normal.  Psychiatric:        Behavior: Behavior normal.        Thought Content: Thought content normal.        Judgment: Judgment normal.    Lab Results  Component Value Date   WBC 8.2 03/14/2019   HGB 12.7 03/14/2019   HCT 38.0 03/14/2019   PLT 259 03/14/2019   GLUCOSE 90 03/14/2019   CHOL 193 03/14/2019   TRIG 116 03/14/2019   HDL 28 (L) 03/14/2019   LDLDIRECT 173.9 04/09/2009   LDLCALC 142 (H) 03/14/2019   ALT 18 10/29/2018   AST 14 (L) 10/29/2018   NA 140 03/14/2019   K 3.6 03/14/2019   CL 111 03/14/2019   CREATININE 0.73 03/14/2019   BUN 12 03/14/2019   CO2 22 03/14/2019   TSH 3.430 03/13/2019   INR 1.0 12/05/2018   HGBA1C 5.4 03/14/2019    CT Angio Head W or Wo Contrast  Result Date: 03/13/2019 CLINICAL DATA:  Initial evaluation for worsening double vision, vomiting. History of CC fistula. EXAM: CT ANGIOGRAPHY HEAD AND NECK TECHNIQUE: Multidetector CT imaging of the head and  neck was performed using the standard protocol during bolus administration of intravenous contrast. Multiplanar CT image reconstructions and MIPs were obtained to evaluate the vascular anatomy. Carotid stenosis measurements (when applicable) are  obtained utilizing NASCET criteria, using the distal internal carotid diameter as the denominator. CONTRAST:  22mL OMNIPAQUE IOHEXOL 350 MG/ML SOLN COMPARISON:  Prior MRI/MRA from 02/07/2019 as well as previous CTA from 10/26/2018. 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 No acute intracranial hemorrhage. No acute large vessel territory infarct. No mass lesion, mass effect, or midline shift. No hydrocephalus. No extra-axial fluid collection. Scattered nonspecific cerebral white matter changes grossly stable. Vascular: No hyperdense vessel. Skull: Scalp soft tissues and calvarium within normal limits. Sinuses: Mild layering opacity noted within the left sphenoid sinus. Paranasal sinuses are otherwise clear. No mastoid effusion. Orbits: Partially visualized globes and orbital soft tissues grossly unremarkable. CTA NECK FINDINGS Aortic arch: Visualized aortic arch normal in caliber with normal 3 vessel morphology. Mild atheromatous change at the origin of the left subclavian artery without significant stenosis. No hemodynamically significant stenosis about the origin of the great vessels. Visualized subclavian arteries widely patent. Right carotid system: Right CCA patent from its origin to the bifurcation without stenosis or other abnormality. Irregular somewhat linear filling defect about the right bifurcation favored to be artifactual, although a possible small dissection flap not entirely excluded (series 9, image 113). No significant atheromatous stenosis about the right bifurcation. Right ICA widely patent distally to the skull base. Distal  cervical ICA dolichoectatic. Associated aneurysm relatively similar measuring approximately 10 x 6 mm. Left carotid system: Left common carotid artery widely patent proximally. There is a linear dissection flap extending from the proximal through the mid left CCA, seen on prior exam, although more prominent on today's study (series 9, image 119). No significant intimal irregularity or intraluminal thrombus. Left CCA remains widely patent to the bifurcation. No significant narrowing about the left bifurcation itself. 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 Vertebral arteries: Both vertebral arteries arise from the subclavian arteries. Left vertebral artery dominant. There is a new fusiform aneurysm measuring 10 x 7 mm involving the left V2 segment at the level of C4-5 (series 8, image 230). 5 mm aneurysm involving the distal right V2 segment at the level of the C2 foramen is relatively stable (series 8, image 194). Vertebral arteries otherwise widely patent and unchanged. Skeleton: No acute osseous finding. No discrete lytic or blastic osseous lesions Other neck: No other acute soft tissue abnormality within the neck. Upper chest: Layering fluid noted within the upper esophageal lumen. Scattered emphysematous changes noted within the lungs. Visualized upper chest demonstrates no other acute finding. Review of the MIP images confirms the above findings CTA HEAD FINDINGS Anterior circulation: Visualized petrous segments patent. Extensive streak artifacts seen about the cavernous sinus related to previous coil embolization, limiting assessment. Atheromatous plaque at the para clinoid left ICA with mild to moderate stenosis is unchanged. ICA termini well perfused. A1 segments patent and stable. Normal anterior communicating artery complex. Anterior cerebral arteries  remain well opacified and patent. M1 segments widely patent. Negative MCA bifurcations. Distal MCA branches well perfused. Extensive asymmetric prominence of the venous system within the right cerebral hemisphere is now seen, presumably secondary to prior coil embolization about the cavernous sinus. Right inferior petrosal sinus dilated and prominent. Multiple prominent venous collaterals also extend cephalad along the anterior falx. Posterior circulation: Vertebral arteries widely patent to the vertebrobasilar junction and  are stable in appearance. Posterior inferior cerebral arteries patent bilaterally visualized basilar patent proximally and distally without obvious stenosis, although assessment limited by streak artifact. Superior cerebral arteries patent bilaterally. Both PCAs remain patent and well opacified to their distal aspects. Venous sinuses: Major dural sinuses remain patent. Multiple new venous collaterals along the right inferior petrosal sinus and right cerebral hemisphere related to coil embolization of the CC fistula. Left superior orbital vein has been embolized. Anatomic variants: None significant. Review of the MIP images confirms the above findings IMPRESSION: CT HEAD IMPRESSION: Stable appearance of the brain. No acute intracranial abnormality identified. CTA HEAD AND NECK IMPRESSION: 1. Extensive streak artifact from prior coil embolization of cavernous carotid fistula, limiting assessment. Evaluation for ongoing cc fistulous connection limited on this exam. 2. New dissection flap involving the proximal-mid left common carotid artery without significant narrowing. 3. 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 4. 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 5. Unchanged distal cervical ICA aneurysms as compared to previous. 6. 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 7. Interval development of multiple prominent venous  collaterals throughout the right cerebral hemisphere related to altered flow dynamics from previous coil embolization of CC fistula. Critical Value/emergent results were called by telephone at the time of interpretation on 03/13/2019 at 4:53 am to [place] , who verbally acknowledged these results. Electronically Signed   By: Jeannine Boga M.D.   On: 03/13/2019 04:53   CT Angio Neck W and/or Wo Contrast  Result Date: 03/13/2019 CLINICAL DATA:  Initial evaluation for worsening double vision, vomiting. History of CC fistula. EXAM: CT ANGIOGRAPHY HEAD AND NECK TECHNIQUE: Multidetector CT imaging of the head and neck was performed using the standard protocol during bolus administration of intravenous contrast. Multiplanar CT image reconstructions and MIPs were obtained to evaluate the vascular anatomy. Carotid stenosis measurements (when applicable) are obtained utilizing NASCET criteria, using the distal internal carotid diameter as the denominator. CONTRAST:  46mL OMNIPAQUE IOHEXOL 350 MG/ML SOLN COMPARISON:  Prior MRI/MRA from 02/07/2019 as well as previous CTA from 10/26/2018. 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 No acute intracranial hemorrhage. No acute large vessel territory infarct. No mass lesion, mass effect, or midline shift. No hydrocephalus. No extra-axial fluid collection. Scattered nonspecific cerebral white matter changes grossly stable. Vascular: No hyperdense vessel. Skull: Scalp soft tissues and calvarium within normal limits. Sinuses: Mild layering opacity noted within the left sphenoid sinus. Paranasal sinuses are otherwise clear. No mastoid effusion. Orbits: Partially visualized globes and orbital soft tissues grossly unremarkable. CTA NECK FINDINGS Aortic arch: Visualized aortic arch normal in caliber with normal 3 vessel morphology. Mild  atheromatous change at the origin of the left subclavian artery without significant stenosis. No hemodynamically significant stenosis about the origin of the great vessels. Visualized subclavian arteries widely patent. Right carotid system: Right CCA patent from its origin to the bifurcation without stenosis or other abnormality. Irregular somewhat linear filling defect about the right bifurcation favored to be artifactual, although a possible small dissection flap not entirely excluded (series 9, image 113). No significant atheromatous stenosis about the right bifurcation. Right ICA widely patent distally to the skull base. Distal cervical ICA dolichoectatic. Associated aneurysm relatively similar measuring approximately 10 x 6 mm. Left carotid system: Left common carotid artery widely patent proximally. There is a linear dissection  flap extending from the proximal through the mid left CCA, seen on prior exam, although more prominent on today's study (series 9, image 119). No significant intimal irregularity or intraluminal thrombus. Left CCA remains widely patent to the bifurcation. No significant narrowing about the left bifurcation itself. 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 Vertebral arteries: Both vertebral arteries arise from the subclavian arteries. Left vertebral artery dominant. There is a new fusiform aneurysm measuring 10 x 7 mm involving the left V2 segment at the level of C4-5 (series 8, image 230). 5 mm aneurysm involving the distal right V2 segment at the level of the C2 foramen is relatively stable (series 8, image 194). Vertebral arteries otherwise widely patent and unchanged. Skeleton: No acute osseous finding. No discrete lytic or blastic osseous lesions Other neck: No other acute soft tissue abnormality within the neck. Upper chest: Layering  fluid noted within the upper esophageal lumen. Scattered emphysematous changes noted within the lungs. Visualized upper chest demonstrates no other acute finding. Review of the MIP images confirms the above findings CTA HEAD FINDINGS Anterior circulation: Visualized petrous segments patent. Extensive streak artifacts seen about the cavernous sinus related to previous coil embolization, limiting assessment. Atheromatous plaque at the para clinoid left ICA with mild to moderate stenosis is unchanged. ICA termini well perfused. A1 segments patent and stable. Normal anterior communicating artery complex. Anterior cerebral arteries remain well opacified and patent. M1 segments widely patent. Negative MCA bifurcations. Distal MCA branches well perfused. Extensive asymmetric prominence of the venous system within the right cerebral hemisphere is now seen, presumably secondary to prior coil embolization about the cavernous sinus. Right inferior petrosal sinus dilated and prominent. Multiple prominent venous collaterals also extend cephalad along the anterior falx. Posterior circulation: Vertebral arteries widely patent to the vertebrobasilar junction and are stable in appearance. Posterior inferior cerebral arteries patent bilaterally visualized basilar patent proximally and distally without obvious stenosis, although assessment limited by streak artifact. Superior cerebral arteries patent bilaterally. Both PCAs remain patent and well opacified to their distal aspects. Venous sinuses: Major dural sinuses remain patent. Multiple new venous collaterals along the right inferior petrosal sinus and right cerebral hemisphere related to coil embolization of the CC fistula. Left superior orbital vein has been embolized. Anatomic variants: None significant. Review of the MIP images confirms the above findings IMPRESSION: CT HEAD IMPRESSION: Stable appearance of the brain. No acute intracranial abnormality identified. CTA HEAD AND  NECK IMPRESSION: 1. Extensive streak artifact from prior coil embolization of cavernous carotid fistula, limiting assessment. Evaluation for ongoing cc fistulous connection limited on this exam. 2. New dissection flap involving the proximal-mid left common carotid artery without significant narrowing. 3. 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 4. 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 5. Unchanged distal cervical ICA aneurysms as compared to previous. 6. 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 7. Interval development of multiple prominent venous collaterals throughout the right cerebral hemisphere related to altered flow dynamics from previous coil embolization of CC fistula. Critical Value/emergent results were called by telephone at the time of interpretation on 03/13/2019 at 4:53 am to [place] , who verbally acknowledged these results. Electronically Signed   By: Jeannine Boga M.D.   On: 03/13/2019 04:53   MR BRAIN WO CONTRAST  Result Date: 03/13/2019 CLINICAL DATA:  History of carotid cavernous fistula. Carotid artery dissection. Evaluate for infarct. EXAM: MRI HEAD WITHOUT CONTRAST TECHNIQUE: Multiplanar, multiecho pulse sequences of the brain and surrounding structures were obtained without intravenous contrast. COMPARISON:  02/07/2019 FINDINGS: Brain: No acute infarction, hemorrhage, hydrocephalus, extra-axial collection or mass lesion. Few remote white matter insults are stable and mainly periventricular. Normal brain volume Vascular: Abnormal number of asymmetric vessels along the surface of the right brain as recently evaluated by CTA. Bulbous cervical and cavernous ICA, reference prior CTA. Skull and upper cervical spine: Negative for marrow lesion Sinuses/Orbits: Negative IMPRESSION: Negative for acute infarct or other interval finding. Electronically Signed   By: Monte Fantasia M.D.   On:  03/13/2019 07:33    Assessment & Plan:   Problem List Items Addressed This Visit     B12 deficiency    On B12      Depression    Check FSH      Relevant Medications   diazepam (VALIUM) 5 MG tablet   Metabolic syndrome    Mild Eat at home, do 5000 steps/d Fish oil or Krill oil      Relevant Orders   Comprehensive metabolic panel   Lipid panel   TSH   FSH   Sweats, menopausal    New Check FSH  LMP 05/30/21 - irregular      Relevant Medications   triamterene-hydrochlorothiazide (MAXZIDE-25) 37.5-25 MG tablet   Other Relevant Orders   FSH   Vitamin D deficiency    Vit D         Meds ordered this encounter  Medications   MegaRed Omega-3 Krill Oil 500 MG CAPS    Sig: Take 1 capsule by mouth every morning.    Dispense:  100 capsule    Refill:  3   diazepam (VALIUM) 5 MG tablet    Sig: Take 1 tablet (5 mg total) by mouth 2 (two) times daily as needed for anxiety.    Dispense:  60 tablet    Refill:  2    Needs office visit   potassium chloride SA (KLOR-CON M20) 20 MEQ tablet    Sig: Take 1 tablet (20 mEq total) by mouth 2 (two) times daily.    Dispense:  180 tablet    Refill:  3   triamterene-hydrochlorothiazide (MAXZIDE-25) 37.5-25 MG tablet    Sig: Take 1 tablet by mouth daily.    Dispense:  90 tablet    Refill:  3   metroNIDAZOLE (FLAGYL) 500 MG tablet    Sig: Take 1 tablet (500 mg total) by mouth 3 (three) times daily.    Dispense:  21 tablet    Refill:  0      Follow-up: Return in about 6 months (around 12/22/2021) for Wellness Exam.  Walker Kehr, MD

## 2021-06-24 NOTE — Assessment & Plan Note (Addendum)
New Check FSH  LMP 05/30/21 - irregular

## 2021-06-24 NOTE — Assessment & Plan Note (Signed)
On B12 

## 2021-06-24 NOTE — Assessment & Plan Note (Signed)
Mild Eat at home, do 5000 steps/d Fish oil or Krill oil

## 2021-06-24 NOTE — Assessment & Plan Note (Signed)
Vit D 

## 2021-07-08 ENCOUNTER — Telehealth: Payer: 59 | Admitting: Family Medicine

## 2021-09-02 ENCOUNTER — Telehealth: Payer: Self-pay

## 2021-09-02 NOTE — Telephone Encounter (Signed)
Pt sister Dorian Furnace called to report pt death... Sister reports she was found in a puddle of blood states EMS couldn't revive her they said she choked on her on blood... ? ?Vaughan Basta [phone] ? ?FYI ?

## 2021-09-08 ENCOUNTER — Telehealth: Payer: Self-pay | Admitting: Internal Medicine

## 2021-09-08 NOTE — Telephone Encounter (Signed)
[name], ?I am asked to fill out [name]'s death certificate. ?I need to know the date of death, time of death, time of pronouncing dead. ?I need to know more about the circumstances of her death to establish her probable cause of death. ?Please call her Sister Rayetta Pigg.  I called Bonita Quin several times with no avail. ?Thank you, ? ? ? ?

## 2021-09-08 NOTE — Telephone Encounter (Signed)
Rep w/ [facility] making provider aware his signature is needed for death certificate ?

## 2021-09-09 NOTE — Telephone Encounter (Signed)
Ems arrived at patient home performed 30 mins of CPR started at 11:00 am discontinued at 11:32 am. Patient was pronounced deceased at that time ? ?Patient was found by sister and spouse. Patient was last heard from 09/01/2021 at 7:30 pm. Patient was found Sep 20, 2021 10:50 am. Patient was having heavy bleeding from nose. Patient has hx of aneurysm. Family believes that was cause of death.  ? ?Medical examiner refuse case ? ?No obvious trauma was noted by EMS and Fire  ?

## 2021-09-09 NOTE — Telephone Encounter (Signed)
Synetta Fail from [employer] calling in ? ?Wanted to make sure provider got information needed to sign off on death certificate.. says they would like to go ahead & cremate patient as soon as possible since DOD was 03/16 ? ?Please fu [phone] ?

## 2021-09-09 NOTE — Telephone Encounter (Signed)
Called Rodena Piety at [facility] to inform her that the provider will be signing the death certificate . She stated she will let the funeral director know  ?

## 2021-09-10 NOTE — Telephone Encounter (Signed)
Done. Thx.

## 2021-09-13 NOTE — Telephone Encounter (Signed)
Death certificate was filled out.  ? ?Card ?

## 2021-09-18 DEATH — deceased

## 2023-12-15 ENCOUNTER — Encounter (HOSPITAL_COMMUNITY): Payer: Self-pay | Admitting: Interventional Radiology
